# Patient Record
Sex: Female | Born: 1950
Health system: Southern US, Community
[De-identification: ages and names within clinical notes are randomized; demographics above are authoritative.]

## PROBLEM LIST (undated history)

## (undated) DIAGNOSIS — F419 Anxiety disorder, unspecified: Secondary | ICD-10-CM

## (undated) DIAGNOSIS — I509 Heart failure, unspecified: Secondary | ICD-10-CM

## (undated) DIAGNOSIS — R1013 Epigastric pain: Secondary | ICD-10-CM

## (undated) DIAGNOSIS — F32A Depression, unspecified: Secondary | ICD-10-CM

## (undated) DIAGNOSIS — M1990 Unspecified osteoarthritis, unspecified site: Secondary | ICD-10-CM

## (undated) DIAGNOSIS — E785 Hyperlipidemia, unspecified: Secondary | ICD-10-CM

## (undated) DIAGNOSIS — J449 Chronic obstructive pulmonary disease, unspecified: Secondary | ICD-10-CM

## (undated) DIAGNOSIS — E049 Nontoxic goiter, unspecified: Secondary | ICD-10-CM

## (undated) DIAGNOSIS — Z87891 Personal history of nicotine dependence: Secondary | ICD-10-CM

## (undated) DIAGNOSIS — F410 Panic disorder [episodic paroxysmal anxiety] without agoraphobia: Secondary | ICD-10-CM

## (undated) DIAGNOSIS — J439 Emphysema, unspecified: Secondary | ICD-10-CM

## (undated) DIAGNOSIS — R0989 Other specified symptoms and signs involving the circulatory and respiratory systems: Secondary | ICD-10-CM

## (undated) DIAGNOSIS — G4700 Insomnia, unspecified: Secondary | ICD-10-CM

## (undated) DIAGNOSIS — K219 Gastro-esophageal reflux disease without esophagitis: Secondary | ICD-10-CM

## (undated) DIAGNOSIS — M549 Dorsalgia, unspecified: Secondary | ICD-10-CM

## (undated) DIAGNOSIS — Z86010 Personal history of colonic polyps: Secondary | ICD-10-CM

## (undated) DIAGNOSIS — F329 Major depressive disorder, single episode, unspecified: Secondary | ICD-10-CM

## (undated) DIAGNOSIS — I499 Cardiac arrhythmia, unspecified: Secondary | ICD-10-CM

## (undated) DIAGNOSIS — T7840XA Allergy, unspecified, initial encounter: Secondary | ICD-10-CM

## (undated) DIAGNOSIS — I10 Essential (primary) hypertension: Secondary | ICD-10-CM

## (undated) DIAGNOSIS — R739 Hyperglycemia, unspecified: Secondary | ICD-10-CM

## (undated) HISTORY — DX: Personal history of nicotine dependence: Z87.891

## (undated) HISTORY — DX: Depression, unspecified: F32.A

## (undated) HISTORY — DX: Panic disorder (episodic paroxysmal anxiety): F41.0

## (undated) HISTORY — DX: Essential (primary) hypertension: I10

## (undated) HISTORY — DX: Chronic obstructive pulmonary disease, unspecified: J44.9

## (undated) HISTORY — DX: Epigastric pain: R10.13

## (undated) HISTORY — DX: Emphysema, unspecified: J43.9

## (undated) HISTORY — DX: Personal history of colonic polyps: Z86.010

## (undated) HISTORY — DX: Other specified symptoms and signs involving the circulatory and respiratory systems: R09.89

## (undated) HISTORY — DX: Cardiac arrhythmia, unspecified: I49.9

## (undated) HISTORY — DX: Hyperglycemia, unspecified: R73.9

## (undated) HISTORY — DX: Allergy, unspecified, initial encounter: T78.40XA

## (undated) HISTORY — PX: THYROID SURGERY: SHX805

## (undated) HISTORY — DX: Major depressive disorder, single episode, unspecified: F32.9

## (undated) HISTORY — DX: Heart failure, unspecified: I50.9

## (undated) HISTORY — DX: Dorsalgia, unspecified: M54.9

## (undated) HISTORY — DX: Insomnia, unspecified: G47.00

## (undated) HISTORY — DX: Gastro-esophageal reflux disease without esophagitis: K21.9

## (undated) HISTORY — DX: Unspecified osteoarthritis, unspecified site: M19.90

## (undated) HISTORY — PX: WISDOM TOOTH EXTRACTION: SHX21

## (undated) HISTORY — PX: COLONOSCOPY: SHX174

## (undated) HISTORY — DX: Hyperlipidemia, unspecified: E78.5

## (undated) HISTORY — DX: Nontoxic goiter, unspecified: E04.9

## (undated) HISTORY — PX: EYE SURGERY: SHX253

## (undated) HISTORY — DX: Anxiety disorder, unspecified: F41.9

---

## 2000-05-16 ENCOUNTER — Other Ambulatory Visit: Admission: RE | Admit: 2000-05-16 | Discharge: 2000-05-16 | Payer: Self-pay | Admitting: Obstetrics and Gynecology

## 2002-02-12 ENCOUNTER — Other Ambulatory Visit: Admission: RE | Admit: 2002-02-12 | Discharge: 2002-02-12 | Payer: Self-pay | Admitting: Obstetrics and Gynecology

## 2002-03-13 HISTORY — PX: ELBOW SURGERY: SHX618

## 2002-09-22 ENCOUNTER — Encounter: Payer: Self-pay | Admitting: Family Medicine

## 2002-09-22 ENCOUNTER — Ambulatory Visit (HOSPITAL_COMMUNITY): Admission: RE | Admit: 2002-09-22 | Discharge: 2002-09-22 | Payer: Self-pay | Admitting: Family Medicine

## 2003-02-16 ENCOUNTER — Ambulatory Visit (HOSPITAL_COMMUNITY): Admission: RE | Admit: 2003-02-16 | Discharge: 2003-02-16 | Payer: Self-pay | Admitting: *Deleted

## 2003-02-18 ENCOUNTER — Ambulatory Visit (HOSPITAL_BASED_OUTPATIENT_CLINIC_OR_DEPARTMENT_OTHER): Admission: RE | Admit: 2003-02-18 | Discharge: 2003-02-18 | Payer: Self-pay | Admitting: *Deleted

## 2003-11-24 ENCOUNTER — Other Ambulatory Visit: Admission: RE | Admit: 2003-11-24 | Discharge: 2003-11-24 | Payer: Self-pay | Admitting: Obstetrics and Gynecology

## 2003-12-12 ENCOUNTER — Encounter: Payer: Self-pay | Admitting: Family Medicine

## 2003-12-12 LAB — HM MAMMOGRAPHY

## 2004-05-17 ENCOUNTER — Ambulatory Visit: Payer: Self-pay | Admitting: Family Medicine

## 2004-05-25 ENCOUNTER — Ambulatory Visit: Payer: Self-pay | Admitting: Internal Medicine

## 2004-06-09 ENCOUNTER — Ambulatory Visit: Payer: Self-pay | Admitting: Internal Medicine

## 2004-06-09 DIAGNOSIS — Z860100 Personal history of colon polyps, unspecified: Secondary | ICD-10-CM

## 2004-06-09 HISTORY — DX: Personal history of colonic polyps: Z86.010

## 2004-06-09 HISTORY — DX: Personal history of colon polyps, unspecified: Z86.0100

## 2004-07-01 ENCOUNTER — Ambulatory Visit: Payer: Self-pay | Admitting: Family Medicine

## 2004-07-19 ENCOUNTER — Ambulatory Visit: Payer: Self-pay | Admitting: Family Medicine

## 2006-01-29 ENCOUNTER — Ambulatory Visit: Payer: Self-pay | Admitting: Family Medicine

## 2006-01-30 ENCOUNTER — Ambulatory Visit: Payer: Self-pay | Admitting: Family Medicine

## 2006-06-06 ENCOUNTER — Ambulatory Visit: Payer: Self-pay | Admitting: Family Medicine

## 2006-06-06 LAB — CONVERTED CEMR LAB
ALT: 23 units/L (ref 0–40)
AST: 24 units/L (ref 0–37)
VLDL: 24 mg/dL (ref 0–40)

## 2006-06-11 ENCOUNTER — Ambulatory Visit: Payer: Self-pay | Admitting: Family Medicine

## 2006-06-11 LAB — CONVERTED CEMR LAB
Free T4: 0.6 ng/dL (ref 0.6–1.6)
Glucose, Bld: 78 mg/dL (ref 70–99)
TSH: 1.02 microintl units/mL (ref 0.35–5.50)

## 2006-07-27 ENCOUNTER — Encounter: Payer: Self-pay | Admitting: Family Medicine

## 2006-07-27 DIAGNOSIS — F418 Other specified anxiety disorders: Secondary | ICD-10-CM | POA: Insufficient documentation

## 2006-07-27 DIAGNOSIS — E785 Hyperlipidemia, unspecified: Secondary | ICD-10-CM | POA: Insufficient documentation

## 2006-07-27 DIAGNOSIS — Z87891 Personal history of nicotine dependence: Secondary | ICD-10-CM

## 2006-07-27 DIAGNOSIS — Z8639 Personal history of other endocrine, nutritional and metabolic disease: Secondary | ICD-10-CM

## 2006-07-27 DIAGNOSIS — F410 Panic disorder [episodic paroxysmal anxiety] without agoraphobia: Secondary | ICD-10-CM

## 2006-07-27 DIAGNOSIS — Z9189 Other specified personal risk factors, not elsewhere classified: Secondary | ICD-10-CM

## 2006-07-27 DIAGNOSIS — F419 Anxiety disorder, unspecified: Secondary | ICD-10-CM

## 2006-12-13 ENCOUNTER — Encounter: Payer: Self-pay | Admitting: Family Medicine

## 2007-03-18 ENCOUNTER — Telehealth: Payer: Self-pay | Admitting: Family Medicine

## 2007-06-12 ENCOUNTER — Telehealth: Payer: Self-pay | Admitting: Family Medicine

## 2007-06-26 ENCOUNTER — Ambulatory Visit: Payer: Self-pay | Admitting: Family Medicine

## 2007-06-26 DIAGNOSIS — M545 Low back pain, unspecified: Secondary | ICD-10-CM | POA: Insufficient documentation

## 2007-06-26 DIAGNOSIS — M549 Dorsalgia, unspecified: Secondary | ICD-10-CM

## 2007-06-28 ENCOUNTER — Encounter: Payer: Self-pay | Admitting: Family Medicine

## 2007-07-15 ENCOUNTER — Telehealth: Payer: Self-pay | Admitting: Family Medicine

## 2007-07-16 ENCOUNTER — Ambulatory Visit: Payer: Self-pay | Admitting: Family Medicine

## 2007-07-17 ENCOUNTER — Telehealth (INDEPENDENT_AMBULATORY_CARE_PROVIDER_SITE_OTHER): Payer: Self-pay | Admitting: *Deleted

## 2007-07-19 LAB — CONVERTED CEMR LAB
ALT: 18 units/L (ref 0–35)
AST: 21 units/L (ref 0–37)
CO2: 29 meq/L (ref 19–32)
Chloride: 106 meq/L (ref 96–112)
Creatinine, Ser: 0.8 mg/dL (ref 0.4–1.2)
GFR calc Af Amer: 95 mL/min
GFR calc non Af Amer: 78 mL/min
LDL Cholesterol: 68 mg/dL (ref 0–99)
Phosphorus: 3.2 mg/dL (ref 2.3–4.6)
Potassium: 4 meq/L (ref 3.5–5.1)
Sodium: 141 meq/L (ref 135–145)
Total CHOL/HDL Ratio: 3.6

## 2007-07-25 ENCOUNTER — Ambulatory Visit: Payer: Self-pay | Admitting: Family Medicine

## 2007-07-25 DIAGNOSIS — J309 Allergic rhinitis, unspecified: Secondary | ICD-10-CM | POA: Insufficient documentation

## 2007-12-02 ENCOUNTER — Telehealth (INDEPENDENT_AMBULATORY_CARE_PROVIDER_SITE_OTHER): Payer: Self-pay | Admitting: *Deleted

## 2008-01-15 ENCOUNTER — Ambulatory Visit: Payer: Self-pay | Admitting: Family Medicine

## 2008-01-22 ENCOUNTER — Ambulatory Visit: Payer: Self-pay | Admitting: Family Medicine

## 2008-01-22 DIAGNOSIS — I10 Essential (primary) hypertension: Secondary | ICD-10-CM | POA: Insufficient documentation

## 2008-02-05 ENCOUNTER — Ambulatory Visit: Payer: Self-pay | Admitting: Family Medicine

## 2008-02-12 LAB — CONVERTED CEMR LAB
ALT: 18 units/L (ref 0–35)
AST: 20 units/L (ref 0–37)
BUN: 12 mg/dL (ref 6–23)
Bilirubin, Direct: 0.1 mg/dL (ref 0.0–0.3)
Calcium: 9.4 mg/dL (ref 8.4–10.5)
Eosinophils Relative: 1.5 % (ref 0.0–5.0)
Glucose, Bld: 95 mg/dL (ref 70–99)
Hemoglobin: 13.3 g/dL (ref 12.0–15.0)
LDL Cholesterol: 70 mg/dL (ref 0–99)
Lymphocytes Relative: 27.8 % (ref 12.0–46.0)
Monocytes Relative: 17.1 % — ABNORMAL HIGH (ref 3.0–12.0)
Neutro Abs: 5 10*3/uL (ref 1.4–7.7)
Phosphorus: 3.1 mg/dL (ref 2.3–4.6)
RDW: 11.8 % (ref 11.5–14.6)
Sodium: 143 meq/L (ref 135–145)
Total Bilirubin: 0.6 mg/dL (ref 0.3–1.2)
Total CHOL/HDL Ratio: 3
Triglycerides: 109 mg/dL (ref 0–149)
WBC: 9.3 10*3/uL (ref 4.5–10.5)

## 2008-02-21 ENCOUNTER — Ambulatory Visit: Payer: Self-pay | Admitting: Family Medicine

## 2008-02-21 DIAGNOSIS — R739 Hyperglycemia, unspecified: Secondary | ICD-10-CM

## 2008-02-25 ENCOUNTER — Ambulatory Visit: Payer: Self-pay | Admitting: Professional

## 2008-02-25 ENCOUNTER — Ambulatory Visit: Payer: Self-pay | Admitting: Family Medicine

## 2008-02-26 ENCOUNTER — Telehealth (INDEPENDENT_AMBULATORY_CARE_PROVIDER_SITE_OTHER): Payer: Self-pay | Admitting: *Deleted

## 2008-02-26 ENCOUNTER — Encounter: Payer: Self-pay | Admitting: Family Medicine

## 2008-03-31 ENCOUNTER — Ambulatory Visit: Payer: Self-pay | Admitting: Professional

## 2008-04-06 ENCOUNTER — Telehealth: Payer: Self-pay | Admitting: Family Medicine

## 2008-04-16 ENCOUNTER — Ambulatory Visit: Payer: Self-pay | Admitting: Family Medicine

## 2008-05-06 ENCOUNTER — Ambulatory Visit: Payer: Self-pay | Admitting: Family Medicine

## 2008-05-07 LAB — CONVERTED CEMR LAB
Albumin: 4.2 g/dL
BUN: 13 mg/dL
CO2: 27 meq/L
Calcium: 9.5 mg/dL
Chloride: 102 meq/L
Creatinine, Ser: 0.6 mg/dL
GFR calc Af Amer: 132 mL/min
GFR calc non Af Amer: 109 mL/min
Glucose, Bld: 104 mg/dL — ABNORMAL HIGH
Hgb A1c MFr Bld: 5.9 %
Phosphorus: 3 mg/dL
Potassium: 3.6 meq/L
Sodium: 139 meq/L

## 2008-05-19 ENCOUNTER — Ambulatory Visit: Payer: Self-pay | Admitting: Family Medicine

## 2008-10-12 ENCOUNTER — Telehealth: Payer: Self-pay | Admitting: Family Medicine

## 2008-10-28 ENCOUNTER — Ambulatory Visit: Payer: Self-pay | Admitting: Family Medicine

## 2008-11-18 ENCOUNTER — Encounter (INDEPENDENT_AMBULATORY_CARE_PROVIDER_SITE_OTHER): Payer: Self-pay | Admitting: *Deleted

## 2008-11-24 ENCOUNTER — Ambulatory Visit: Payer: Self-pay | Admitting: Family Medicine

## 2008-11-26 LAB — CONVERTED CEMR LAB
Albumin: 4.1 g/dL (ref 3.5–5.2)
BUN: 17 mg/dL (ref 6–23)
Chloride: 103 meq/L (ref 96–112)
Cholesterol: 144 mg/dL (ref 0–200)
Glucose, Bld: 88 mg/dL (ref 70–99)
HCT: 39.5 % (ref 36.0–46.0)
LDL Cholesterol: 64 mg/dL (ref 0–99)
MCV: 91.7 fL (ref 78.0–100.0)
Phosphorus: 3.5 mg/dL (ref 2.3–4.6)
Platelets: 251 10*3/uL (ref 150.0–400.0)
Triglycerides: 163 mg/dL — ABNORMAL HIGH (ref 0.0–149.0)
VLDL: 32.6 mg/dL (ref 0.0–40.0)
WBC: 11.5 10*3/uL — ABNORMAL HIGH (ref 4.5–10.5)

## 2008-12-01 ENCOUNTER — Encounter: Payer: Self-pay | Admitting: Family Medicine

## 2008-12-08 ENCOUNTER — Encounter: Admission: RE | Admit: 2008-12-08 | Discharge: 2008-12-08 | Payer: Self-pay | Admitting: Otolaryngology

## 2009-01-05 ENCOUNTER — Encounter: Admission: RE | Admit: 2009-01-05 | Discharge: 2009-01-05 | Payer: Self-pay | Admitting: Otolaryngology

## 2009-01-05 ENCOUNTER — Encounter: Payer: Self-pay | Admitting: Family Medicine

## 2009-01-05 ENCOUNTER — Encounter (INDEPENDENT_AMBULATORY_CARE_PROVIDER_SITE_OTHER): Payer: Self-pay | Admitting: Interventional Radiology

## 2009-01-05 ENCOUNTER — Other Ambulatory Visit: Admission: RE | Admit: 2009-01-05 | Discharge: 2009-01-05 | Payer: Self-pay | Admitting: Interventional Radiology

## 2009-01-19 ENCOUNTER — Encounter: Payer: Self-pay | Admitting: Family Medicine

## 2009-03-11 ENCOUNTER — Encounter (INDEPENDENT_AMBULATORY_CARE_PROVIDER_SITE_OTHER): Payer: Self-pay | Admitting: Otolaryngology

## 2009-03-11 ENCOUNTER — Encounter: Payer: Self-pay | Admitting: Family Medicine

## 2009-03-11 ENCOUNTER — Ambulatory Visit (HOSPITAL_COMMUNITY): Admission: RE | Admit: 2009-03-11 | Discharge: 2009-03-12 | Payer: Self-pay | Admitting: Otolaryngology

## 2009-04-09 ENCOUNTER — Telehealth: Payer: Self-pay | Admitting: Family Medicine

## 2009-05-07 ENCOUNTER — Encounter (INDEPENDENT_AMBULATORY_CARE_PROVIDER_SITE_OTHER): Payer: Self-pay | Admitting: *Deleted

## 2009-05-10 ENCOUNTER — Telehealth: Payer: Self-pay | Admitting: Family Medicine

## 2009-05-11 ENCOUNTER — Ambulatory Visit: Payer: Self-pay | Admitting: Family Medicine

## 2009-05-12 LAB — CONVERTED CEMR LAB
BUN: 8 mg/dL (ref 6–23)
Basophils Absolute: 0.1 10*3/uL (ref 0.0–0.1)
Bilirubin, Direct: 0 mg/dL (ref 0.0–0.3)
Chloride: 106 meq/L (ref 96–112)
Creatinine, Ser: 0.6 mg/dL (ref 0.4–1.2)
Eosinophils Absolute: 0.1 10*3/uL (ref 0.0–0.7)
Eosinophils Relative: 0.7 % (ref 0.0–5.0)
Glucose, Bld: 110 mg/dL — ABNORMAL HIGH (ref 70–99)
MCHC: 33.6 g/dL (ref 30.0–36.0)
MCV: 93.3 fL (ref 78.0–100.0)
Monocytes Absolute: 1.3 10*3/uL — ABNORMAL HIGH (ref 0.1–1.0)
Neutrophils Relative %: 57.7 % (ref 43.0–77.0)
Platelets: 238 10*3/uL (ref 150.0–400.0)
RDW: 11.8 % (ref 11.5–14.6)
Total Bilirubin: 0.5 mg/dL (ref 0.3–1.2)
WBC: 7.9 10*3/uL (ref 4.5–10.5)

## 2009-10-04 ENCOUNTER — Telehealth: Payer: Self-pay | Admitting: Family Medicine

## 2009-11-24 ENCOUNTER — Encounter: Payer: Self-pay | Admitting: Family Medicine

## 2009-11-25 ENCOUNTER — Ambulatory Visit: Payer: Self-pay | Admitting: Internal Medicine

## 2009-11-25 ENCOUNTER — Encounter: Payer: Self-pay | Admitting: Family Medicine

## 2009-11-25 LAB — CONVERTED CEMR LAB
Cholesterol, target level: 200 mg/dL
HDL goal, serum: 40 mg/dL
LDL Goal: 130 mg/dL

## 2009-11-26 ENCOUNTER — Ambulatory Visit: Payer: Self-pay | Admitting: Cardiology

## 2009-11-26 ENCOUNTER — Encounter: Payer: Self-pay | Admitting: Family Medicine

## 2009-11-29 ENCOUNTER — Encounter: Payer: Self-pay | Admitting: Family Medicine

## 2009-12-15 ENCOUNTER — Telehealth (INDEPENDENT_AMBULATORY_CARE_PROVIDER_SITE_OTHER): Payer: Self-pay

## 2009-12-16 ENCOUNTER — Ambulatory Visit: Payer: Self-pay | Admitting: Cardiology

## 2009-12-16 ENCOUNTER — Encounter: Payer: Self-pay | Admitting: Cardiology

## 2009-12-16 ENCOUNTER — Ambulatory Visit: Payer: Self-pay

## 2009-12-16 ENCOUNTER — Ambulatory Visit (HOSPITAL_COMMUNITY): Admission: RE | Admit: 2009-12-16 | Discharge: 2009-12-16 | Payer: Self-pay | Admitting: Cardiology

## 2009-12-17 ENCOUNTER — Telehealth: Payer: Self-pay | Admitting: Cardiology

## 2009-12-22 ENCOUNTER — Telehealth: Payer: Self-pay | Admitting: Cardiology

## 2009-12-30 ENCOUNTER — Telehealth: Payer: Self-pay | Admitting: Family Medicine

## 2010-01-01 ENCOUNTER — Encounter: Payer: Self-pay | Admitting: Cardiology

## 2010-01-06 ENCOUNTER — Ambulatory Visit: Payer: Self-pay | Admitting: Cardiology

## 2010-03-09 ENCOUNTER — Telehealth: Payer: Self-pay | Admitting: Internal Medicine

## 2010-03-21 ENCOUNTER — Telehealth (INDEPENDENT_AMBULATORY_CARE_PROVIDER_SITE_OTHER): Payer: Self-pay | Admitting: *Deleted

## 2010-03-23 ENCOUNTER — Ambulatory Visit: Admit: 2010-03-23 | Payer: Self-pay | Admitting: Family Medicine

## 2010-03-29 ENCOUNTER — Ambulatory Visit
Admission: RE | Admit: 2010-03-29 | Discharge: 2010-03-29 | Payer: Self-pay | Source: Home / Self Care | Attending: Family Medicine | Admitting: Family Medicine

## 2010-03-29 ENCOUNTER — Other Ambulatory Visit: Payer: Self-pay | Admitting: Family Medicine

## 2010-03-29 LAB — RENAL FUNCTION PANEL
Albumin: 4.4 g/dL (ref 3.5–5.2)
BUN: 11 mg/dL (ref 6–23)
CO2: 25 mEq/L (ref 19–32)
Calcium: 9.7 mg/dL (ref 8.4–10.5)
Chloride: 105 mEq/L (ref 96–112)
Creatinine, Ser: 0.8 mg/dL (ref 0.4–1.2)
GFR: 74.26 mL/min (ref >60.00)
Glucose, Bld: 127 mg/dL — ABNORMAL HIGH (ref 70–99)
Phosphorus: 2.8 mg/dL (ref 2.3–4.6)
Potassium: 4.1 mEq/L (ref 3.5–5.1)
Sodium: 141 mEq/L (ref 135–145)

## 2010-03-29 LAB — LIPID PANEL
Cholesterol: 147 mg/dL (ref 0–200)
HDL: 48.6 mg/dL (ref >39.00)
LDL Cholesterol: 65 mg/dL (ref 0–99)
Total CHOL/HDL Ratio: 3
Triglycerides: 168 mg/dL — ABNORMAL HIGH (ref 0.0–149.0)
VLDL: 33.6 mg/dL (ref 0.0–40.0)

## 2010-03-29 LAB — CBC WITH DIFFERENTIAL/PLATELET
Basophils Absolute: 0 10*3/uL (ref 0.0–0.1)
Basophils Relative: 0.3 % (ref 0.0–3.0)
Eosinophils Absolute: 0.1 10*3/uL (ref 0.0–0.7)
Eosinophils Relative: 0.5 % (ref 0.0–5.0)
HCT: 38.8 % (ref 36.0–46.0)
Hemoglobin: 13.3 g/dL (ref 12.0–15.0)
Lymphocytes Relative: 15.1 % (ref 12.0–46.0)
Lymphs Abs: 2 10*3/uL (ref 0.7–4.0)
MCHC: 34.3 g/dL (ref 30.0–36.0)
MCV: 92.8 fl (ref 78.0–100.0)
Monocytes Absolute: 2.4 10*3/uL — ABNORMAL HIGH (ref 0.1–1.0)
Monocytes Relative: 18 % — ABNORMAL HIGH (ref 3.0–12.0)
Neutro Abs: 8.8 10*3/uL — ABNORMAL HIGH (ref 1.4–7.7)
Neutrophils Relative %: 66.1 % (ref 43.0–77.0)
Platelets: 250 10*3/uL (ref 150.0–400.0)
RBC: 4.18 Mil/uL (ref 3.87–5.11)
RDW: 12.7 % (ref 11.5–14.6)
WBC: 13.4 10*3/uL — ABNORMAL HIGH (ref 4.5–10.5)

## 2010-03-29 LAB — HEPATIC FUNCTION PANEL
ALT: 20 U/L (ref 0–35)
AST: 25 U/L (ref 0–37)
Albumin: 4.4 g/dL (ref 3.5–5.2)
Alkaline Phosphatase: 61 U/L (ref 39–117)
Bilirubin, Direct: 0.1 mg/dL (ref 0.0–0.3)
Total Bilirubin: 0.4 mg/dL (ref 0.3–1.2)
Total Protein: 7.4 g/dL (ref 6.0–8.3)

## 2010-03-29 LAB — TSH: TSH: 1.62 u[IU]/mL (ref 0.35–5.50)

## 2010-03-29 LAB — HEMOGLOBIN A1C: Hgb A1c MFr Bld: 6.2 % (ref 4.6–6.5)

## 2010-03-30 DIAGNOSIS — D72829 Elevated white blood cell count, unspecified: Secondary | ICD-10-CM | POA: Insufficient documentation

## 2010-04-01 ENCOUNTER — Telehealth: Payer: Self-pay | Admitting: Family Medicine

## 2010-04-12 ENCOUNTER — Ambulatory Visit
Admission: RE | Admit: 2010-04-12 | Discharge: 2010-04-12 | Payer: Self-pay | Source: Home / Self Care | Attending: Family Medicine | Admitting: Family Medicine

## 2010-04-12 ENCOUNTER — Other Ambulatory Visit: Payer: Self-pay | Admitting: Family Medicine

## 2010-04-12 LAB — CBC WITH DIFFERENTIAL/PLATELET
Basophils Absolute: 0 10*3/uL (ref 0.0–0.1)
Basophils Relative: 0.4 % (ref 0.0–3.0)
Hemoglobin: 13.2 g/dL (ref 12.0–15.0)
Lymphocytes Relative: 26.3 % (ref 12.0–46.0)
Monocytes Relative: 17.7 % — ABNORMAL HIGH (ref 3.0–12.0)
Neutro Abs: 5.6 10*3/uL (ref 1.4–7.7)
Neutrophils Relative %: 55 % (ref 43.0–77.0)
RBC: 4.09 Mil/uL (ref 3.87–5.11)
WBC: 10.2 10*3/uL (ref 4.5–10.5)

## 2010-04-12 NOTE — Progress Notes (Signed)
Summary: Stress Echo Pre-Procedure  Phone Note Outgoing Call Call back at Home Phone [phone]   Call placed by: Irean Hong, RN,  December 15, 2009 2:50 PM Summary of Call: Patient notified of instructions for Stress Echo in am. Anita Harrell.

## 2010-04-12 NOTE — Progress Notes (Signed)
Summary: refill request for ambien  Phone Note Refill Request Message from:  Fax from Pharmacy  Refills Requested: Medication #1:  AMBIEN CR 12.5 MG TBCR take one by mouth at bedtime   Last Refilled: 09/03/2009 [facility] [address], [phone].  Initial call taken by: Lowella Petties CMA,  October 04, 2009 8:19 AM  Follow-up for Phone Call        px written on EMR for call in  Follow-up by: Judith Part MD,  October 04, 2009 8:34 AM  Additional Follow-up for Phone Call Additional follow up Details #1::        [facility]. Lewanda Rife LPN  October 04, 2009 9:11 AM     Prescriptions: AMBIEN CR 12.5 MG TBCR (ZOLPIDEM TARTRATE) take one by mouth at bedtime  #30 x 5   Entered and Authorized by:   Judith Part MD   Signed by:   Lewanda Rife LPN on 11/91/4782   Method used:   Telephoned to ...         RxID:   [id number]

## 2010-04-12 NOTE — Miscellaneous (Signed)
Summary: Appointment Canceled  Appointment status changed to canceled by [employer] on 11/26/2009 2:07 PM.  Cancellation Comments --------------------- stress echo with joyce/saf  Appointment Information ----------------------- Appt Type:  EMR NO DOCUMENT      Date:  Thursday, December 16, 2009      Time:  8:30 AM for 30 min   Urgency:  Routine   Made By:  Pearson Grippe  To Visit:  [id number]    Reason:  stress echo with joyce/saf  Appt Comments ------------- -- 11/26/09 14:07: (CEMR) CANCELED -- stress echo with joyce/saf -- 11/26/09 12:39: (CEMR) BOOKED -- Routine EMR NO DOCUMENT at 12/16/2009 8:30 AM for 30 min stress echo with joyce/saf

## 2010-04-12 NOTE — Assessment & Plan Note (Signed)
Summary: NP6/ ASAP / DX: CHESTPAIN/ PT HAS UHC/ GD    Visit Type:  Initial Consult Primary Averey Trompeter:  Colon Flattery Tower MD  CC:  chest pain.  History of Present Illness: The patient is here for evaluation of chest pain.  She had an episode several days ago severe pain.  EMS was called.  Her pain resolved an EKG showed no acute change.  She was then seen within our primary care office and referred to me for further evaluation.  The patient smoked for 30 years but she did stop in October, 2010.  There is no strong family history of coronary disease.  She does have mild hypertension. She is on Crestor for hyperlipidemia.  Her labs had shown good control of her LDL.  HDL is good.  Triglycerides are mildly elevated.  Patient has not had any recurrent chest pain since the episode several days ago.  Current Medications (verified): 1)  Paxil 40 Mg Tabs (Paroxetine Hcl) .Marland Kitchen.. 1 By Mouth Once Daily 2)  Ambien Cr 12.5 Mg Tbcr (Zolpidem Tartrate) .... Take One By Mouth At Bedtime 3)  Crestor 10 Mg Tabs (Rosuvastatin Calcium) .... Take One By Mouth Daily 4)  Nasonex  Susp (Mometasone Furoate Susp) .... 2 Sprays in Each Nostil Daily As Needed 5)  Multivitamins  Tabs (Multiple Vitamin) .... One By Mouth Daily 6)  Zestoretic 10-12.5 Mg Tabs (Lisinopril-Hydrochlorothiazide) .Marland Kitchen.. 1 By Mouth Each Am 7)  Tums 500 Mg Chew (Calcium Carbonate Antacid) .... Otc As Directed. 8)  Advil 200 Mg Tabs (Ibuprofen) .... Otc As Directed. 9)  Alprazolam 0.5 Mg Tabs (Alprazolam) .... 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 no alcohol at all   Past Medical History: CHEST PAIN  UNSPECIFIED  EPIGASTRIC PAIN OTHER SCREENING MAMMOGRAM  HYPERGLYCEMIA, MILD HYPERTENSION, BENIGN ESSENTIAL  ALLERGIC RHINITIS  BACK PAIN GOITER Hx of TOBACCO ABUSE Hx of PANIC DISORDER INSOMNIA, HX OF  HYPERLIPIDEMIA DEPRESSION  ANXIETY labile blood pressure  allergic rhinitis   Review of Systems       Patient denies fever, chills, headache, sweats, rash, change in vision, change in hearing, cough, nausea vomiting, urinary symptoms.  All other systems are reviewed and are negative.  Vital Signs:  Patient profile:   60 year old female Height:      65 inches Weight:      182 pounds BMI:     30.40 Pulse rate:   113 / minute Resp:     14 per minute BP sitting:   135 / 94  (left arm)  Vitals Entered By: Kem Parkinson (November 26, 2009 11:49 AM)  Physical Exam  General:  The patient is stable. Head:  head is atraumatic. Eyes:  no xanthelasma. Neck:  no jugular venous distention. Chest Wall:  no chest wall tenderness. Breasts:  breasts are large. Lungs:  lungs are clear.  Respiratory effort is nonlabored. Heart:  cardiac exam reveals S1 and S2.  No clicks or significant murmurs. Abdomen:  abdomen is soft. Msk:  no musculoskeletal deformities. Extremities:  no peripheral edema. Skin:  no skin rashes. Psych:  patient is oriented to person time and place.  Affect is  normal.  The patient is here with her husband today.   Impression & Recommendations:  Problem # 1:  HYPERTENSION, BENIGN ESSENTIAL (ICD-401.1)  The following medications were removed from the medication list:    Aspirin 81 Mg Tbec (Aspirin) ..... One daily Her updated medication list for this problem includes:    Zestoretic 10-12.5 Mg Tabs (Lisinopril-hydrochlorothiazide) .Marland Kitchen... 1 by mouth each am Blood pressure slightly elevated today.  I will not be changing her medications.  Problem # 2:  Hx of TOBACCO ABUSE (ICD-305.1) Fortunately the patient stopped smoking in October, 2010.  Problem # 3:   HYPERLIPIDEMIA (ICD-272.4)  Her updated medication list for this problem includes:    Crestor 10 Mg Tabs (Rosuvastatin calcium) .Marland Kitchen... Take one by mouth daily The patient's lipids are being treated very nicely.  No change in therapy.  Problem # 4:  CHEST PAIN UNSPECIFIED (ICD-786.50)  The following medications were removed from the medication list:    Aspirin 81 Mg Tbec (Aspirin) ..... One daily Her updated medication list for this problem includes:    Zestoretic 10-12.5 Mg Tabs (Lisinopril-hydrochlorothiazide) .Marland Kitchen... 1 by mouth each am The patient's chest pain the other day is concerning.  However there is no evidence of an acute MI.  EKG is done today and reviewed by me.  There is mild sinus tachycardia.  We will proceed with a stress echo to assess her status further.  I will then see her back for followup.  I have not changed any of her medications.  Orders: EKG w/ Interpretation (93000) Stress Echo (Stress Echo)  Patient Instructions: 1)  Your physician has requested that you have a stress echocardiogram. For further information please visit [email or web address]  Please follow instruction sheet as given. 2)  Follow up in 1 month with Dr Myrtis Ser

## 2010-04-12 NOTE — Miscellaneous (Signed)
Summary: Appointment Canceled  Appointment status changed to canceled by [employer] on 11/29/2009 2:36 PM.  Cancellation Comments --------------------- stress echo with joyce/saf  Appointment Information ----------------------- Appt Type:  EMR NO DOCUMENT      Date:  Thursday, December 16, 2009      Time:  3:00 PM for 30 min   Urgency:  Routine   Made By:  Pearson Grippe  To Visit:  [id number]    Reason:  stress echo with joyce/saf  Appt Comments ------------- -- 11/29/09 14:36: (CEMR) CANCELED -- stress echo with joyce/saf -- 11/26/09 14:07: (CEMR) BOOKED -- Routine EMR NO DOCUMENT at 12/16/2009 3:00 PM for 30 min stress echo with joyce/saf

## 2010-04-12 NOTE — Progress Notes (Signed)
Summary: TEST RESULTS   Phone Note Call from Patient Call back at Home Phone [phone]   Caller: Patient Reason for Call: Talk to Nurse, Lab or Test Results Summary of Call: PT WANT TO KNOW HER ECHO STRESS RESULTS. PT WANTS TO SPEAK WITH HER NURSE. PT STATES SHE HAS CALLED BEFORE BUT HAS NOT RECEIVE A CALL. Initial call taken by: Roe Coombs,  December 22, 2009 1:26 PM  Follow-up for Phone Call        I recieved mess today, prev. mess was not sent to RN, called pt and apoligized, gave her test results Meredith Staggers, RN  December 22, 2009 6:05 PM

## 2010-04-12 NOTE — Progress Notes (Signed)
Summary: ECHO RESULTS   Phone Note Call from Patient Call back at [facility] Phone [phone]   Caller: Patient Summary of Call: ECHO REULTS Initial call taken by: Judie Grieve,  December 17, 2009 3:34 PM  Follow-up for Phone Call        mess was never sent to RN, when pt called back on 10/12 this mess was discovered, see that phone mess Meredith Staggers, RN  December 22, 2009 6:07 PM

## 2010-04-12 NOTE — Progress Notes (Signed)
Summary: Crestor 10mg   Phone Note Refill Request Call back at [phone] [facility] on December 30, 2009 4:43 PM  Refills Requested: Medication #1:  CRESTOR 10 MG TABS Take one by mouth daily [facility] electronically request refill on Crestor 10mg  no refill date sent. Pt has been seeing Cardiologist. Please advise about refilling Crestor. thank you.   Method Requested: Telephone to Pharmacy Initial call taken by: Lewanda Rife LPN,  December 30, 2009 4:44 PM  Follow-up for Phone Call        please remind her that I think she is due for lipid labs - here or with cardiol (she sees Dr Myrtis Ser later this month) thanks px written on EMR for call in  Follow-up by: Judith Part MD,  December 30, 2009 4:51 PM  Additional Follow-up for Phone Call Additional follow up Details #1::        [facility] as instructed. Added note for pt to call our office. Left message for patient to call back on pt's cell phone. Lewanda Rife LPN  December 30, 2009 4:59 PM   Patient notified as instructed by telephone. Pt said she would call back and schedule fasting lab test.[facility] LPN  December 31, 2009 12:19 PM     New/Updated Medications: CRESTOR 10 MG TABS (ROSUVASTATIN CALCIUM) Take one by mouth daily 0.Prescriptions: CRESTOR 10 MG TABS (ROSUVASTATIN CALCIUM) Take one by mouth daily  #30 x 11   Entered and Authorized by:   Judith Part MD   Signed by:   Lewanda Rife LPN on 45/40/9811   Method used:   Telephoned to ...       [facility] [facility]ail)       [address]       [place], [place]  [address]       Ph: [phone] or [phone]       Fax: [phone]   RxID:   [id number]

## 2010-04-12 NOTE — Miscellaneous (Signed)
Summary: Appointment Canceled  Appointment status changed to canceled by [employer] on 11/29/2009 2:33 PM.  Cancellation Comments --------------------- stress echo/786.50/uch/no pre. req/saf  Appointment Information ----------------------- Appt Type:  CARDIOLOGY ANCILLARY VISIT      Date:  Thursday, December 16, 2009      Time:  3:00 PM for 60 min   Urgency:  Routine   Made By:  Pearson Grippe  To Visit:  [id number]    Reason:  stress echo/786.50/uch/no pre. req/saf  Appt Comments ------------- -- 11/29/09 14:33: (CEMR) CANCELED -- stress echo/786.50/uch/no pre. req/saf -- 11/26/09 12:38: (CEMR) BOOKED -- Routine CARDIOLOGY ANCILLARY VISIT at 12/16/2009 3:00 PM for 60 min stress echo/786.50/uch/no pre. req/saf

## 2010-04-12 NOTE — Miscellaneous (Signed)
  Clinical Lists Changes  Observations: Added new observation of PAST MED HX: CHEST PAIn    stress echo...12/16/2009...normal...EF  60% EF  60%...echo...12/2009 EPIGASTRIC PAIN OTHER SCREENING MAMMOGRAM  HYPERGLYCEMIA, MILD HYPERTENSION, BENIGN ESSENTIAL  ALLERGIC RHINITIS  BACK PAIN GOITER Hx of TOBACCO ABUSE Hx of PANIC DISORDER INSOMNIA, HX OF  HYPERLIPIDEMIA DEPRESSION  ANXIETY labile blood pressure  allergic rhinitis    cardiol-- Dr Myrtis Ser (01/01/2010 20:45) Added new observation of PRIMARY MD: Judith Part MD (01/01/2010 20:45)       Past History:  Past Medical History: CHEST PAIn    stress echo...12/16/2009...normal...EF  60% EF  60%...echo...12/2009 EPIGASTRIC PAIN OTHER SCREENING MAMMOGRAM  HYPERGLYCEMIA, MILD HYPERTENSION, BENIGN ESSENTIAL  ALLERGIC RHINITIS  BACK PAIN GOITER Hx of TOBACCO ABUSE Hx of PANIC DISORDER INSOMNIA, HX OF  HYPERLIPIDEMIA DEPRESSION  ANXIETY labile blood pressure  allergic rhinitis    cardiol-- Dr Myrtis Ser

## 2010-04-12 NOTE — Progress Notes (Signed)
Summary: Rx Paxil  Phone Note Refill Request Call back at [phone] [facility] on May 10, 2009 9:30 AM  Refills Requested: Medication #1:  PAXIL 40 MG TABS 1 by mouth once daily   Last Refilled: 04/08/2009 Received faxed refill request, please advise   Method Requested: Telephone to Pharmacy Initial call taken by: Linde Gillis CMA Duncan Dull),  May 10, 2009 9:30 AM  Follow-up for Phone Call        px written on EMR for call in  Follow-up by: Judith Part MD,  May 10, 2009 1:08 PM    Prescriptions: PAXIL 40 MG TABS (PAROXETINE HCL) 1 by mouth once daily  #30 x 11   Entered by:   Delilah Shan CMA (AAMA)   Authorized by:   Judith Part MD   Signed by:   Delilah Shan CMA (AAMA) on 05/10/2009   Method used:   Electronically to        [facility] (retail)       [address]       [place], [place]  [address]       Ph: [phone] or [phone]       Fax: [phone]   RxID:   [id number]

## 2010-04-12 NOTE — Progress Notes (Signed)
Summary: ? Stomach virus  Phone Note Call from Patient   Caller: Patient Call For: Judith Part MD Summary of Call: Patient has been vomiting since Friday, mostly phlem, she hasn't eating anything except chicken noodle soup, having back pain and upper stomach pain.  Low grade fever, 99.2, some nausea, some diarrhea, drinking ginger ale.  Advised patient to try Pedialyte, small sipps to keep herself hydrated, no fried/greasy foods, no soda.  [facility]  Please advise. Initial call taken by: Linde Gillis CMA Duncan Dull),  May 10, 2009 10:06 AM  Follow-up for Phone Call        unfortunately she needs to be seen- but not able to today  I want her to go to [facility] for eval please (or closer UC if that is not convenient for her)  Follow-up by: Judith Part MD,  May 10, 2009 10:52 AM  Additional Follow-up for Phone Call Additional follow up Details #1::        Patient Advised.   Patient asked if she could get an appt. here tomorrow?  Call sent to [facility] for scheduling. Additional Follow-up by: Delilah Shan CMA (AAMA),  May 10, 2009 11:42 AM

## 2010-04-12 NOTE — Assessment & Plan Note (Signed)
Summary: [facility]  Medications Added MULTIVITAMINS  TABS (MULTIPLE VITAMIN) every other day ASPIRIN 81 MG TBEC (ASPIRIN) Take one tablet by mouth daily      Allergies Added:   Visit Type:  Follow-up Primary Provider:  Judith Part MD  CC:  chest pain.  History of Present Illness: The patient is seen for follow up of the evaluation of chest pain.  I saw her on November 26, 2009.  We decided at that time to proceed with a stress echo.  This was done December 16, 2009.  She had normal exercise with no EKG change.  Resting echo images were normal and there was no ischemia with stress.  This was a negative study.  Since the time I saw her she has been dealing with her home stresses better.  She feels better in general.  Current Medications (verified): 1)  Paxil 40 Mg Tabs (Paroxetine Hcl) .Marland Kitchen.. 1 By Mouth Once Daily 2)  Ambien Cr 12.5 Mg Tbcr (Zolpidem Tartrate) .... Take One By Mouth At Bedtime 3)  Crestor 10 Mg Tabs (Rosuvastatin Calcium) .... Take One By Mouth Daily 4)  Multivitamins  Tabs (Multiple Vitamin) .... Every Other Day 5)  Zestoretic 10-12.5 Mg Tabs (Lisinopril-Hydrochlorothiazide) .Marland Kitchen.. 1 By Mouth Each Am 6)  Tums 500 Mg Chew (Calcium Carbonate Antacid) .... Otc As Directed. 7)  Advil 200 Mg Tabs (Ibuprofen) .... Otc As Directed. 8)  Alprazolam 0.5 Mg Tabs (Alprazolam) .... One Pill Daily As Needed For Panic Attack 9)  Aspirin 81 Mg Tbec (Aspirin) .... Take One Tablet By Mouth Daily  Allergies (verified): 1)  Codeine  Past History:  Past Medical History: CHEST PAIn    stress echo...12/16/2009...normal...EF  60% EF  60%...echo...12/2009.Marland Kitchen EPIGASTRIC PAIN OTHER SCREENING MAMMOGRAM  HYPERGLYCEMIA, MILD HYPERTENSION, BENIGN ESSENTIAL  ALLERGIC RHINITIS  BACK PAIN GOITER Hx of TOBACCO ABUSE Hx of PANIC DISORDER INSOMNIA, HX OF  HYPERLIPIDEMIA DEPRESSION  ANXIETY labile blood pressure  allergic rhinitis    cardiol-- Dr Myrtis Ser  Review of Systems    Patient denies fever, chills, headache, sweats, rash, change in vision, change in hearing, chest pain, cough, nausea vomiting, urinary symptoms.  All other systems are reviewed and are negative.  Vital Signs:  Patient profile:   60 year old female Weight:      187 pounds Pulse rate:   98 / minute BP sitting:   114 / 78  (left arm)  Vitals Entered By: Hardin Negus, RMA (January 06, 2010 8:29 AM)  Physical Exam  General:  patient is stable. Eyes:  no xanthelasma. Neck:  no jugular venous attention. Lungs:  lungs are clear.  Respiratory effort is nonlabored. Heart:  cardiac exam reveals an S1-S2.  No clicks or significant murmurs. Abdomen:  abdomen is soft. Extremities:  no peripheral edema. Psych:  patient is oriented to person time and place.  Affect is normal.  She is dealing with illnesses in her family better.   Impression & Recommendations:  Problem # 1:  CHEST PAIN UNSPECIFIED (ICD-786.50)  Her updated medication list for this problem includes:    Zestoretic 10-12.5 Mg Tabs (Lisinopril-hydrochlorothiazide) .Marland Kitchen... 1 by mouth each am    Aspirin 81 Mg Tbec (Aspirin) .Marland Kitchen... Take one tablet by mouth daily The patient's stress echo is normal.  She has normal LV function and no sign of ischemia.  No further workup needed. I had a careful discussion with the patient about all aspects of her exercise test.  She is reassured.  Problem # 2:  HYPERTENSION, BENIGN ESSENTIAL (ICD-401.1)  Her updated medication list for this problem includes:    Zestoretic 10-12.5 Mg Tabs (Lisinopril-hydrochlorothiazide) .Marland Kitchen... 1 by mouth each am    Aspirin 81 Mg Tbec (Aspirin) .Marland Kitchen... Take one tablet by mouth daily Blood pressure is controlled today.  No change in therapy.  Patient Instructions: 1)  Your physician recommends that you schedule a follow-up appointment in: As needed 2)  Your physician recommends that you continue on your current medications as directed. Please refer to the Current  Medication list given to you today.

## 2010-04-12 NOTE — Progress Notes (Signed)
Summary: Rx Zolpidem  Phone Note Refill Request Call back at [phone] [facility] on April 09, 2009 8:22 AM  Refills Requested: Medication #1:  AMBIEN CR 12.5 MG TBCR take one by mouth at bedtime   Last Refilled: 03/10/2009 Received faxed refill request, please advise   Method Requested: Electronic Initial call taken by: Linde Gillis CMA Duncan Dull),  April 09, 2009 8:23 AM  Follow-up for Phone Call        px written on EMR for call in  Follow-up by: Judith Part MD,  April 09, 2009 8:40 AM  Additional Follow-up for Phone Call Additional follow up Details #1::        Rx called to pharmacy Additional Follow-up by: Linde Gillis CMA Duncan Dull),  April 09, 2009 9:45 AM    New/Updated Medications: AMBIEN CR 12.5 MG TBCR (ZOLPIDEM TARTRATE) take one by mouth at bedtime Prescriptions: AMBIEN CR 12.5 MG TBCR (ZOLPIDEM TARTRATE) take one by mouth at bedtime  #30 x 5   Entered and Authorized by:   Judith Part MD   Signed by:   Judith Part MD on 04/09/2009   Method used:   Telephoned to ...       [facility] (retail)       [address]       [place], [place]  [address]       Ph: [phone] or [phone]       Fax: [phone]   RxID:   [id number]

## 2010-04-12 NOTE — Letter (Signed)
Summary: Colonoscopy Letter  [facility]  [address] [place], [place] [address]   Phone: [phone]  Fax: [phone]      May 07, 2009 MRN: [id number]   [facility] [address] [place], [place]  [address]   Dear Ms. Benassi,   According to your medical record, it is time for you to schedule a Colonoscopy. The American Cancer Society recommends this procedure as a method to detect early colon cancer. Patients with a family history of colon cancer, or a personal history of colon polyps or inflammatory bowel disease are at increased risk.  This letter has beeen generated based on the recommendations made at the time of your procedure. If you feel that in your particular situation this may no longer apply, please contact our office.  Please call our office at [phone] to schedule this appointment or to update your records at your earliest convenience.  Thank you for cooperating with [place] to provide you with the very best care possible.   Sincerely,  Iva Boop, M.D.  [facility] [phone]
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
## 2010-04-12 NOTE — Assessment & Plan Note (Signed)
Summary: THROWING UP,FATIGUE/CLE   Vital Signs:  Patient profile:   60 year old female Height:      65 inches Weight:      176.75 pounds BMI:     29.52 Temp:     97.9 degrees F oral Pulse rate:   88 / minute Pulse rhythm:   regular BP sitting:   128 / 82  (left arm) Cuff size:   regular  Vitals Entered By: Lewanda Rife LPN (May 12, 863 11:28 AM)  History of Present Illness: started sat am - got up and vomited - liquid and phlegm   having abd cramps just above belly button and vomiting and in bed all day  sunday - vomited in am and off and on through the day  yesterday worse cramps than she was having - and they rad to back /also heartburn and indigestion   when she vomits is mostly fluid  is able to keep down clear fluids  few bites of sandwich last nt -kept down until this am  some headache now   had low grade fever no blood in stool or vomitus stool is loose but not severely so   last vomit this am-- is a little better now   no sick contacts  no new foods     did retire - very happy about that  did quit smoking had thyroid surgery for b9 tumors   walks 2 mi per day and abd exercises    Allergies: 1)  Codeine  Past History:  Past Medical History: Last updated: 07/25/2007 Anxiety Depression Hyperlipidemia labile blood pressure  allergic rhinitis  goiter tab abuse  Family History: Last updated: 05/19/2008 Father: ETOH, CHF, lung ca Mother: HTN Sister- breast ca , CHF from chemotx , has defib   Social History: Last updated: 11/24/2008 Marital Status: Married Children: 2  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 Eyes:  Denies blurring and eye irritation. ENT:  Denies sore throat. CV:  Denies chest pain or discomfort, lightheadness, and palpitations. Resp:  Denies cough, shortness of breath, and wheezing. GI:  Complains of abdominal pain, diarrhea, indigestion, nausea, and vomiting; denies bloody stools and dark tarry stools. GU:  Denies discharge and dysuria. MS:  Denies muscle aches. Derm:  Denies poor wound healing and rash. Heme:  Denies abnormal bruising and bleeding.  Physical Exam  General:  mildly fatigued appearing  Head:  normocephalic, atraumatic, and no abnormalities observed.   Eyes:  vision grossly intact, pupils equal, pupils round, and pupils reactive to light.  no conjunctival pallor, injection or icterus  Mouth:  MMM, throat clear  Neck:  No deformities, masses, or tenderness noted. Lungs:  diffusely distant bs without wheeze or rales  Heart:  Normal rate and regular rhythm. S1 and S2 normal without gallop, murmur, click, rub or other extra sounds. Abdomen:  mild epigastric tenderness mildly inc bs 4 Q- not high pitched or tinkling soft, no distention, no masses, no hepatomegaly, and no splenomegaly.   Msk:  no CVA tenderness  Pulses:  R and L carotid,radial,femoral,dorsalis pedis and posterior tibial pulses are full and equal bilaterally Skin:  Intact without suspicious lesions or rashes nl color and turgor  no pallor or jaundice quick cap refil time Cervical Nodes:  No lymphadenopathy noted Inguinal Nodes:  No significant adenopathy Psych:  normal affect, talkative and pleasant    Impression & Recommendations:  Problem # 1:  EPIGASTRIC PAIN (ICD-789.06) Assessment New suspect from gastroenteritis viral (with vomiting and loose stools that are slowing down) disc gradual re-hydration with clear fluids and then slowly adv brat diet  lab today  update if worse pain or fever or if s/s dehydration- which we reviewed  Orders: Venipuncture (13086) TLB-BMP  (Basic Metabolic Panel-BMET) (80048-METABOL) TLB-CBC Platelet - w/Differential (85025-CBCD) TLB-Hepatic/Liver Function Pnl (80076-HEPATIC)  Complete Medication List: 1)  Paxil 40 Mg Tabs (Paroxetine hcl) .Marland Kitchen.. 1 by mouth once daily 2)  Ambien Cr 12.5 Mg Tbcr (Zolpidem tartrate) .... Take one by mouth at bedtime 3)  Crestor 10 Mg Tabs (Rosuvastatin calcium) .... Take one by mouth daily 4)  Nasonex Susp (Mometasone furoate susp) .... 2 sprays in each nostil daily as needed 5)  Multivitamins Tabs (Multiple vitamin) .... One by mouth daily 6)  Aspirin Adult Low Strength 81 Mg Tbec (Aspirin) .... One by mouth daily 7)  Zestoretic 10-12.5 Mg Tabs (Lisinopril-hydrochlorothiazide) .Marland Kitchen.. 1 by mouth each am 8)  Alprazolam 0.5 Mg Tabs (Alprazolam) .Marland Kitchen.. 1 p once daily as needed severe anxiety 9)  Fish Oil 1000 Mg Caps (Omega-3 fatty acids) .... Take 1 by mouth once daily 10)  Tums 500 Mg Chew (Calcium carbonate antacid) .... Otc as directed. 11)  Advil 200 Mg Tabs (Ibuprofen) .... Otc as directed.  Patient Instructions: 1)  stick with sips of clear fluids and stir bubbles out of carbonated beverages  2)  slowly introduce food - BRAT (bananas/ rice /applesauce and toast )  3)  lab today 4)  get some prilosec otc to settle stomach -- 20 mg one pill daily for 7-10 days -- start today  5)  update me if worse abd pain or no further improvement   Current Allergies (reviewed today): CODEINE

## 2010-04-14 NOTE — Progress Notes (Signed)
----   Converted from flag ---- ---- 03/19/2010 5:08 PM, Judith Part MD wrote: please check lipid/wellness for v70.0 and 272 thanks   ---- 03/17/2010 1:15 PM, Liane Comber CMA (AAMA) wrote: Lab orders please! Good Morning! This pt is scheduled for cpx labs Wed, which labs to draw and dx codes to use? Thanks Tasha ------------------------------

## 2010-04-14 NOTE — Progress Notes (Signed)
Summary: Schedule Colonoscopy.   Phone Note Outgoing Call Call back at [facility] Phone [phone]   Call placed by: Harlow Mares CMA Duncan Dull),  March 09, 2010 3:33 PM Call placed to: Patient Summary of Call: Left a message on patients machine to call back. patient needs colonoscopy due to adenomatous polyps.  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  Follow-up by: Harlow Mares CMA (AAMA),  March 18, 2010 10:51 AM

## 2010-04-14 NOTE — Progress Notes (Signed)
Summary: refill request for Anita Harrell, nasonex  Phone Note Refill Request Message from:  Fax from Pharmacy  Refills Requested: Medication #1:  AMBIEN CR 12.5 MG TBCR take one by mouth at bedtime   Last Refilled: 02/28/2010  Medication #2:  nasonex [facility]  [address], [phone].  Also,  pt requests a refill on nasonex, this is no longer on med list.  Initial call taken by: Lowella Petties CMA, AAMA,  April 01, 2010 2:47 PM  Follow-up for Phone Call        the med list won't let me in -- ? says someone else is in her chart please call in 1 mo of each- thanks   Follow-up by: Judith Part MD,  April 01, 2010 5:06 PM  Additional Follow-up for Phone Call Additional follow up Details #1::        Rx called in as directed and noted in chart.  Additional Follow-up by: Janee Morn CMA [facility] Dull),  April 01, 2010 5:24 PM

## 2010-04-14 NOTE — Assessment & Plan Note (Signed)
Summary: F/U LABWORK/CLE   Vital Signs:  Patient profile:   60 year old female Weight:      187.75 pounds BMI:     31.36 Temp:     98.6 degrees F oral Pulse rate:   110 / minute Pulse rhythm:   regular BP sitting:   130 / 84  (left arm) Cuff size:   large  Vitals Entered By: Selena Batten Dance CMA [facility] Dull) (March 29, 2010 9:29 AM) CC: Follow up   History of Present Illness: here for f/u of hyperglycemia and HTN and lipids and mood disorder   she has had a rough year overall  had chest pain and went to cardiology  was told poss panic attack  grief over lost of BIL and MIL within 2 weeks of each other  also a lot of caregiving   is still a non smoker- is very proud of that    wt is stable with bmi of 31  hyperglycemia - due for check diet   lipids due for check on crestor and diet  mood -- panic attacks are slowly improving  is worse in the ams -- then better as day gets started can start some exercise - likes to walk  does not want counseling  on paxil and alprazolam as needed panic attacks   hx of goiter - no changes   Allergies: 1)  Codeine  Past History:  Past Medical History: Last updated: 01/06/2010 CHEST PAIn    stress echo...12/16/2009...normal...EF  60% EF  60%...echo...12/2009.Marland Kitchen EPIGASTRIC PAIN OTHER SCREENING MAMMOGRAM  HYPERGLYCEMIA, MILD HYPERTENSION, BENIGN ESSENTIAL  ALLERGIC RHINITIS  BACK PAIN GOITER Hx of TOBACCO ABUSE Hx of PANIC DISORDER INSOMNIA, HX OF  HYPERLIPIDEMIA DEPRESSION  ANXIETY labile blood pressure  allergic rhinitis    cardiol-- Dr Myrtis Ser  Past Surgical History: Last updated: 12/20/2009 Elbow surgery (2004) Colonoscopy- rectal polyps, diverticulosis (05/2004) thyroid surgery- B9 masses  10/11 stress echo normal  Family History: Last updated: 05/19/2008 Father: ETOH, CHF, lung ca Mother: HTN Sister- breast ca , CHF from chemotx , has defib   Social History: Last updated: 11/25/2009 Marital Status:  Married Children: 2  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 no alcohol at all   Risk Factors: Smoking Status: quit (11/25/2009)  Review of Systems General:  Denies fatigue, malaise, and sweats. Eyes:  Denies blurring and eye irritation. CV:  Denies chest pain or discomfort, lightheadness, and palpitations. Resp:  Denies cough, shortness of breath, and wheezing. GI:  Denies abdominal pain, change in bowel habits, indigestion, and nausea. MS:  Denies muscle aches and cramps. Derm:  Denies itching, lesion(s), poor wound healing, and rash. Neuro:  Denies numbness and tingling. Psych:  Complains of anxiety and panic attacks; denies suicidal thoughts/plans. Endo:  Denies cold intolerance, excessive thirst, excessive urination, and heat intolerance. Heme:  Denies abnormal bruising and bleeding.  Physical Exam  General:  overweight but generally well appearing  Head:  normocephalic, atraumatic, and no abnormalities observed.   Eyes:  vision grossly intact, pupils equal, pupils round, and pupils reactive to light.  no conjunctival pallor, injection or icterus  Mouth:  pharynx pink and moist.   Neck:  stable goiter no jvd/ ln or carotid bruits  Chest Wall:  No deformities, masses, or tenderness noted. Lungs:  diffusely distant bs good air exch CTA Heart:  Normal rate and regular rhythm. S1 and S2 normal without gallop, murmur, click, rub or other extra sounds. Abdomen:  no renal bruits  Msk:  no CVA tenderness  Pulses:  2+ rad pulses Extremities:  No clubbing, cyanosis, edema, or deformity noted with normal full range of motion of all joints.   Neurologic:  sensation intact to light touch, gait normal, and DTRs symmetrical and normal.   Skin:  Intact without suspicious lesions or rashes Cervical Nodes:  No lymphadenopathy noted Inguinal Nodes:  No significant adenopathy Psych:  tearful at times - but overall talkative and good  attitude not seemingly anx  good eye contact and comm skills    Impression & Recommendations:  Problem # 1:  HYPERGLYCEMIA, MILD (ICD-790.29) Assessment Unchanged pt has stuck to low sugar diet  disc this in detail  AIc today f/u 6 mo adv to start back with exercise Orders: Venipuncture (16109) TLB-Lipid Panel (80061-LIPID) TLB-CBC Platelet - w/Differential (85025-CBCD) TLB-Renal Function Panel (80069-RENAL) TLB-Hepatic/Liver Function Pnl (80076-HEPATIC) TLB-TSH (Thyroid Stimulating Hormone) (84443-TSH) TLB-A1C / Hgb A1C (Glycohemoglobin) (83036-A1C) Prescription Created Electronically [phone])  Problem # 2:  HYPERTENSION, BENIGN ESSENTIAL (ICD-401.1) Assessment: Unchanged  this is fairly controlled - even with anx  no change in med lab today f/u 6 mo  Her updated medication list for this problem includes:    Zestoretic 10-12.5 Mg Tabs (Lisinopril-hydrochlorothiazide) .Marland Kitchen... 1 by mouth each am  Orders: Venipuncture (09811) TLB-Lipid Panel (80061-LIPID) TLB-CBC Platelet - w/Differential (85025-CBCD) TLB-Renal Function Panel (80069-RENAL) TLB-Hepatic/Liver Function Pnl (80076-HEPATIC) TLB-TSH (Thyroid Stimulating Hormone) (84443-TSH) TLB-A1C / Hgb A1C (Glycohemoglobin) (83036-A1C) Prescription Created Electronically [phone])  BP today: 130/84 Prior BP: 114/78 (01/06/2010)  Prior 10 Yr Risk Heart Disease: 8 % (11/25/2009)  Labs Reviewed: K+: 3.6 (05/11/2009) Creat: : 0.6 (05/11/2009)   Chol: 144 (11/24/2008)   HDL: 47.20 (11/24/2008)   LDL: 64 (11/24/2008)   TG: 163.0 (11/24/2008)  Problem # 3:  Hx of PANIC DISORDER (ICD-300.01) Assessment: Improved  with generalized anx and grief declines counseling  slowly improved  disc sit stress/ coping mech/ and tx opt in detail  refil meds  Her updated medication list for this problem includes:    Paxil 40 Mg Tabs (Paroxetine hcl) .Marland Kitchen... 1 by mouth once daily    Alprazolam 0.5 Mg Tabs (Alprazolam) ..... One pill daily as  needed for panic attack  Orders: Prescription Created Electronically [phone])  Problem # 4:  GOITER (ICD-240.9) Assessment: Unchanged no changes tsh today Orders: Venipuncture (13086) TLB-Lipid Panel (80061-LIPID) TLB-CBC Platelet - w/Differential (85025-CBCD) TLB-Renal Function Panel (80069-RENAL) TLB-Hepatic/Liver Function Pnl (80076-HEPATIC) TLB-TSH (Thyroid Stimulating Hormone) (84443-TSH) TLB-A1C / Hgb A1C (Glycohemoglobin) (83036-A1C)  Complete Medication List: 1)  Paxil 40 Mg Tabs (Paroxetine hcl) .Marland Kitchen.. 1 by mouth once daily 2)  Ambien Cr 12.5 Mg Tbcr (Zolpidem tartrate) .... Take one by mouth at bedtime 3)  Crestor 10 Mg Tabs (Rosuvastatin calcium) .... Take one by mouth daily 4)  Multivitamins Tabs (Multiple vitamin) .... Every other day 5)  Zestoretic 10-12.5 Mg Tabs (Lisinopril-hydrochlorothiazide) .Marland Kitchen.. 1 by mouth each am 6)  Tums 500 Mg Chew (Calcium carbonate antacid) .... Otc as directed. 7)  Advil 200 Mg Tabs (Ibuprofen) .... Otc as directed. 8)  Alprazolam 0.5 Mg Tabs (Alprazolam) .... One pill daily as needed for panic attack 9)  Aspirin 81 Mg Tbec (Aspirin) .... Take one tablet by mouth daily  Patient Instructions: 1)  start walking at least 5 days per week  2)  labs today  3)  if you are interested in counseling for grief and anxiety let me know  4)  follow up in about 6 months  Prescriptions: ZESTORETIC 10-12.5 MG TABS (LISINOPRIL-HYDROCHLOROTHIAZIDE) 1 by mouth each am  #30 x 11   Entered and Authorized by:   Judith Part MD   Signed by:   Judith Part MD on 03/29/2010   Method used:   [facility]        [facility] (retail)       [address]       [place], [place]  [address]       Ph: [phone] or [phone]       Fax: [phone]   RxID:   [id number] PAXIL 40 MG TABS (PAROXETINE HCL) 1 by mouth once daily  #30 x 11   Entered and Authorized by:   Judith Part MD   Signed by:   Judith Part MD on  03/29/2010   Method used:   [facility]        [facility] (retail)       [address]       [place], [place]  [address]       Ph: [phone] or [phone]       Fax: [phone]   RxID:   [id number] ALPRAZOLAM 0.5 MG TABS (ALPRAZOLAM) one pill daily as needed for panic attack  #15 x 0   Entered and Authorized by:   Judith Part MD   Signed by:   Judith Part MD on 03/29/2010   Method used:   Print then Give to Patient   RxID:   [id number]    Orders Added: 1)  Venipuncture [60737] 2)  TLB-Lipid Panel [80061-LIPID] 3)  TLB-CBC Platelet - w/Differential [85025-CBCD] 4)  TLB-Renal Function Panel [80069-RENAL] 5)  TLB-Hepatic/Liver Function Pnl [80076-HEPATIC] 6)  TLB-TSH (Thyroid Stimulating Hormone) [84443-TSH] 7)  TLB-A1C / Hgb A1C (Glycohemoglobin) [83036-A1C] 8)  Prescription Created Electronically [G8553] 9)  Est. Patient Level IV [10626]    Current Allergies (reviewed today): CODEINE

## 2010-05-03 ENCOUNTER — Telehealth: Payer: Self-pay | Admitting: Family Medicine

## 2010-05-10 NOTE — Progress Notes (Signed)
Summary: alprazolam / ambien  Phone Note Refill Request Message from:  Fax from Pharmacy on May 03, 2010 8:50 AM  Refills Requested: Medication #1:  ALPRAZOLAM 0.5 MG TABS one pill daily as needed for panic attack   Last Refilled: 04/01/2010  Medication #2:  AMBIEN CR 12.5 MG TBCR take one by mouth at bedtime   Last Refilled: 04/01/2010 [facility]. [phone].   Initial call taken by: Melody Comas,  May 03, 2010 8:50 AM  Follow-up for Phone Call        warn her do not mix these meds please- thanks  Follow-up by: Judith Part MD,  May 03, 2010 10:49 AM  Additional Follow-up for Phone Call Additional follow up Details #1::        Left message on voicemail to call back. Sydell Axon LPN  May 03, 2010 1:16 PM  Patient notified as instructed by telephone. Rx's called to pharmacy. Additional Follow-up by: Sydell Axon LPN,  May 03, 2010 2:14 PM    New/Updated Medications: ALPRAZOLAM 0.5 MG TABS (ALPRAZOLAM) one pill daily as needed for panic attack Prescriptions: AMBIEN CR 12.5 MG TBCR (ZOLPIDEM TARTRATE) take one by mouth at bedtime  #30 x 0   Entered and Authorized by:   Judith Part MD   Signed by:   Judith Part MD on 05/03/2010   Method used:   Telephoned to ...       [facility] [facility]retail)       [address]       [place], [place]  [address]       Ph: [phone] or [phone]       Fax: [phone]   RxID:   [id number] ALPRAZOLAM 0.5 MG TABS (ALPRAZOLAM) one pill daily as needed for panic attack  #15 x 0   Entered and Authorized by:   Judith Part MD   Signed by:   Judith Part MD on 05/03/2010   Method used:   Telephoned to ...       [facility] (retail)       [address]       [place], [place]  [address]       Ph: [phone] or [phone]       Fax: [phone]   RxID:   [id number]

## 2010-05-31 ENCOUNTER — Other Ambulatory Visit: Payer: Self-pay | Admitting: *Deleted

## 2010-05-31 MED ORDER — ZOLPIDEM TARTRATE ER 12.5 MG PO TBCR: 12.5000 mg | EXTENDED_RELEASE_TABLET | Freq: Every evening | ORAL | Status: DC | PRN

## 2010-05-31 NOTE — Telephone Encounter (Signed)
Refill request from [facility]. [phone]. 

## 2010-05-31 NOTE — Telephone Encounter (Signed)
Med called in to [facility] as instructed.

## 2010-06-13 LAB — BASIC METABOLIC PANEL
BUN: 10 mg/dL (ref 6–23)
CO2: 25 mEq/L (ref 19–32)
Chloride: 99 mEq/L (ref 96–112)
Creatinine, Ser: 0.57 mg/dL (ref 0.4–1.2)
Potassium: 3.9 mEq/L (ref 3.5–5.1)

## 2010-06-13 LAB — CBC
HCT: 39.5 % (ref 36.0–46.0)
Hemoglobin: 13.8 g/dL (ref 12.0–15.0)
MCV: 91.1 fL (ref 78.0–100.0)
Platelets: 260 10*3/uL (ref 150–400)
WBC: 11.5 10*3/uL — ABNORMAL HIGH (ref 4.0–10.5)

## 2010-06-30 ENCOUNTER — Other Ambulatory Visit: Payer: Self-pay | Admitting: *Deleted

## 2010-06-30 MED ORDER — ZOLPIDEM TARTRATE ER 12.5 MG PO TBCR: 12.5000 mg | EXTENDED_RELEASE_TABLET | Freq: Every evening | ORAL | Status: DC | PRN

## 2010-06-30 NOTE — Telephone Encounter (Signed)
Medication phoned to [facility] pharmacy as instructed.  

## 2010-06-30 NOTE — Telephone Encounter (Signed)
Px written for call in   

## 2010-07-29 NOTE — Op Note (Signed)
NAMEKYM, Anita Harrell                        ACCOUNT NO.:  [id number]   MEDICAL RECORD NO.:  [id number]                   PATIENT TYPE:  AMB   LOCATION:  [facility]                                  FACILITY:  [facility]   PHYSICIAN:  Lowell Bouton, M.D.      DATE OF BIRTH:  11-Jan-1951   DATE OF PROCEDURE:  DATE OF DISCHARGE:                                 OPERATIVE REPORT   PREOPERATIVE DIAGNOSIS:  Ulnar nerve compression left elbow.   POSTOPERATIVE DIAGNOSIS:  Ulnar nerve compression left elbow.   PROCEDURE:  Anterior transposition of ulnar nerve left elbow.   SURGEON:  Lowell Bouton, M.D.   ANESTHESIA:  General.   OPERATIVE FINDINGS:  The patient had obvious compression of the nerve just  distal to the medial epicondyle. Procedure under general anesthesia with a  tourniquet on the left arm. The left arm was prepped and draped in the usual  fashion after exsanguinating the limb. The tourniquet was inflated to 250  mmHg. A curved incision was made medially over the ulnar nerve. Sharp  dissection was carried through the subcutaneous tissue. The nerve was  identified posterior to the medial epicondyle and was traced from the medial  epicondyle about 8 cm proximally. It was released proximally and then the  nerve was traced distally and fascial bands were released just beyond the  medial epicondyle that appeared to be compressing the nerve. Care was taken  to protect the branch to the flexor carpal naris. After performing a  neurolysis the nerve was freed circumferentially and a bed was developed  anterior to the flexor muscle mass off of the medial epicondyle. The  subcutaneous tissue was dissected up and the nerve was transposed anterior  to the medial epicondyle. It was released proximally and distally far enough  so that there was no tension on the nerve with flexion extension. The branch  to the epithelia was preserved. The nerve was transposed anteriorly and was  secured with a subcutaneous flap of fat and fascia using a 4-0 Vicryl  suture. Three 4-0 Vicryl sutures were inserted to keep the nerve anterior to  the epicondyle. The wound was then irrigated copiously with saline. A vessel  loop drain was left in for drainage. The subcutaneous tissue was closed with  4-0 Vicryl and the skin with a 3-0 subcuticular Prolene. Steri-Strips were  applied. Then 0.5% Marcaine was inserted for pain control. The patient had  dressings applied and a long arm splint. The tourniquet was released with  good circulation to the hand. She went to the recovery room awake, stable,  and in good condition.                                               Lowell Bouton, M.D.    EMM/MEDQ  D:  02/18/2003  T:  02/19/2003  Job:  [id number]   cc:   Neta Mends. Fabian Sharp, M.D. [facility]

## 2010-09-26 ENCOUNTER — Other Ambulatory Visit: Payer: Self-pay | Admitting: *Deleted

## 2010-09-26 MED ORDER — ZOLPIDEM TARTRATE ER 12.5 MG PO TBCR: 12.5000 mg | EXTENDED_RELEASE_TABLET | Freq: Every evening | ORAL | Status: DC | PRN

## 2010-09-26 NOTE — Telephone Encounter (Signed)
Medication phoned to [facility] as instructed.  

## 2010-09-26 NOTE — Telephone Encounter (Signed)
Px written for call in   

## 2010-11-10 ENCOUNTER — Other Ambulatory Visit: Payer: Self-pay | Admitting: Obstetrics and Gynecology

## 2010-12-23 ENCOUNTER — Other Ambulatory Visit: Payer: Self-pay | Admitting: *Deleted

## 2010-12-23 MED ORDER — ROSUVASTATIN CALCIUM 10 MG PO TABS: 10.0000 mg | ORAL_TABLET | Freq: Every day | ORAL | Status: DC

## 2011-01-20 ENCOUNTER — Other Ambulatory Visit: Payer: Self-pay

## 2011-01-20 MED ORDER — ZOLPIDEM TARTRATE ER 12.5 MG PO TBCR: 12.5000 mg | EXTENDED_RELEASE_TABLET | Freq: Every evening | ORAL | Status: DC | PRN

## 2011-01-20 NOTE — Telephone Encounter (Signed)
Px written for call in   

## 2011-01-20 NOTE — Telephone Encounter (Signed)
Medication phoned to [facility] pharmacy as instructed.  

## 2011-01-20 NOTE — Telephone Encounter (Signed)
[facility] faxed refill request Zolpidem tart ER 12.5 mg. Last filled 12/23/10. Pt last seen 03/29/10. Please advise.

## 2011-03-20 ENCOUNTER — Other Ambulatory Visit: Payer: Self-pay | Admitting: Family Medicine

## 2011-03-21 NOTE — Telephone Encounter (Signed)
[facility] request refill on Lisinopril HCTZ 10-12.5 mg and Paxil 40 mg. Pt last seen 03/29/2010. Added to [employer] from [employer]. Please advise.

## 2011-03-21 NOTE — Telephone Encounter (Signed)
Spoke with pt and scheduled a f/u appointment 03/27/11 at 2 pm.

## 2011-03-21 NOTE — Telephone Encounter (Signed)
Please schedule follow up - it has been over a year Will refill electronically

## 2011-03-27 ENCOUNTER — Encounter: Payer: Self-pay | Admitting: Family Medicine

## 2011-03-27 ENCOUNTER — Ambulatory Visit (INDEPENDENT_AMBULATORY_CARE_PROVIDER_SITE_OTHER): Payer: 59 | Admitting: Family Medicine

## 2011-03-27 VITALS — BP 136/74 | HR 104 | Temp 97.9°F | Ht 65.0 in | Wt 193.2 lb

## 2011-03-27 DIAGNOSIS — R7309 Other abnormal glucose: Secondary | ICD-10-CM

## 2011-03-27 DIAGNOSIS — I10 Essential (primary) hypertension: Secondary | ICD-10-CM

## 2011-03-27 DIAGNOSIS — J069 Acute upper respiratory infection, unspecified: Secondary | ICD-10-CM | POA: Insufficient documentation

## 2011-03-27 DIAGNOSIS — Z23 Encounter for immunization: Secondary | ICD-10-CM

## 2011-03-27 DIAGNOSIS — E785 Hyperlipidemia, unspecified: Secondary | ICD-10-CM

## 2011-03-27 LAB — COMPREHENSIVE METABOLIC PANEL
ALT: 27 U/L (ref 0–35)
AST: 30 U/L (ref 0–37)
Creatinine, Ser: 0.7 mg/dL (ref 0.4–1.2)
GFR: 94.77 mL/min (ref >60.00)
Total Bilirubin: 0.8 mg/dL (ref 0.3–1.2)

## 2011-03-27 LAB — LIPID PANEL
Cholesterol: 131 mg/dL (ref 0–200)
LDL Cholesterol: 49 mg/dL (ref 0–99)
Triglycerides: 185 mg/dL — ABNORMAL HIGH (ref 0.0–149.0)
VLDL: 37 mg/dL (ref 0.0–40.0)

## 2011-03-27 LAB — HEMOGLOBIN A1C: Hgb A1c MFr Bld: 6.2 % (ref 4.6–6.5)

## 2011-03-27 MED ORDER — PAROXETINE HCL 40 MG PO TABS: 40.0000 mg | ORAL_TABLET | Freq: Every day | ORAL | Status: DC

## 2011-03-27 MED ORDER — MOMETASONE FUROATE 50 MCG/ACT NA SUSP: 2.0000 | Freq: Every day | NASAL | Status: DC

## 2011-03-27 MED ORDER — ROSUVASTATIN CALCIUM 10 MG PO TABS: 10.0000 mg | ORAL_TABLET | Freq: Every day | ORAL | Status: DC

## 2011-03-27 MED ORDER — LISINOPRIL-HYDROCHLOROTHIAZIDE 10-12.5 MG PO TABS: 1.0000 | ORAL_TABLET | Freq: Every day | ORAL | Status: DC

## 2011-03-27 NOTE — Progress Notes (Signed)
Subjective:    Patient ID: Anita Harrell, female    DOB: Aug 24, 1950, 61 y.o.   MRN: [id number]  HPI Here for f/u of chronic med problems and refil Was sick - and continues to cough  No fever -does not think she had the flu   Needs labs-will get today    bp is 136/74    Today No cp or palpitations or headaches or edema  No side effects to medicines   Wt is up 6 lb with bmi of 32-- lost her mother and her sister this summer -- tough time Has not done anything active since July  Grief is starting to get better  Is now starting to exercise  (4 deaths in a year)   Sister had cancer all over -- ? Perhaps from the lung/ ? Breast -- will try to find out  Mother passed at 89 with cva , died of bladder infection  Lipids due for check  On crestor and diet which has kept her at goal Diet - is back eating better this month -- was eating poorly for a while   Exercise- starting to walk again   Tab status -- has not started smoking !- is very proud   Hyperglycemia- due to check sugar  No excess thirst or urination Some sweets over xmas  Diet- worse   Patient Active Problem List  Diagnoses  . GOITER  . HYPERLIPIDEMIA  . ANXIETY  . PANIC DISORDER  . Former smoker  . DEPRESSION  . HYPERTENSION, BENIGN ESSENTIAL  . ALLERGIC RHINITIS  . BACK PAIN  . HYPERGLYCEMIA, MILD  . INSOMNIA, HX OF  . LEUKOCYTOSIS  . URI (upper respiratory infection)   Past Medical History  Diagnosis Date  . Chest pain   . Epigastric pain   . Hypertension   . Hyperglycemia     mild  . Allergy     allergic rhinitis  . Back pain   . Goiter   . History of tobacco abuse   . Panic disorder     History of  . Insomnia     Hx of  . Hyperlipidemia   . Depression   . Anxiety   . Labile blood pressure    Past Surgical History  Procedure Date  . Elbow surgery 2004  . Thyroid surgery     B9 massess   History  Substance Use Topics  . Smoking status: Former Smoker    Quit date: 12/11/2008  .  Smokeless tobacco: Not on file  . Alcohol Use:    Family History  Problem Relation Age of Onset  . Hypertension Mother   . Stroke Mother   . Alcohol abuse Father   . Cancer Father     lung CA  . Heart disease Father     CHF  . Cancer Sister     breast CA  . Heart disease Sister     CHF from chemotx also has defib   Allergies  Allergen Reactions  . Codeine     REACTION: Nausea and vomiting   Current Outpatient Prescriptions on File Prior to Visit  Medication Sig Dispense Refill  . zolpidem (AMBIEN CR) 12.5 MG CR tablet Take 1 tablet (12.5 mg total) by mouth at bedtime as needed for sleep.  30 tablet  3        Review of Systems Review of Systems  Constitutional: Negative for fever, appetite change, fatigue and unexpected weight change.  Eyes: Negative for pain and visual  disturbance.  ENT neg for ear pain or st Respiratory: Negative for sob or wheeze Cardiovascular: Negative for cp or palpitations    Gastrointestinal: Negative for nausea, diarrhea and constipation.  Genitourinary: Negative for urgency and frequency.  Skin: Negative for pallor or rash   Neurological: Negative for weakness, light-headedness, numbness and headaches.  Hematological: Negative for adenopathy. Does not bruise/bleed easily.  Psychiatric/Behavioral: Negative for dysphoric mood. The patient is not nervous/anxious.          Objective:   Physical Exam  Constitutional: She appears well-developed and well-nourished. No distress.  HENT:  Head: Normocephalic and atraumatic.  Right Ear: External ear normal.  Left Ear: External ear normal.  Mouth/Throat: Oropharynx is clear and moist.       Nares are boggy  Eyes: Conjunctivae and EOM are normal. Pupils are equal, round, and reactive to light. No scleral icterus.  Neck: Normal range of motion. Neck supple. No JVD present. Carotid bruit is not present. No thyromegaly present.  Cardiovascular: Normal rate, regular rhythm, normal heart sounds and  intact distal pulses.   Pulmonary/Chest: Effort normal and breath sounds normal. No respiratory distress. She has no wheezes. She has no rales. She exhibits no tenderness.  Abdominal: Soft. She exhibits no distension, no abdominal bruit and no mass. There is no tenderness.  Musculoskeletal: Normal range of motion. She exhibits no edema and no tenderness.  Lymphadenopathy:    She has no cervical adenopathy.  Neurological: She is alert. She has normal reflexes. No cranial nerve deficit or sensory deficit. She exhibits normal muscle tone. Coordination normal.  Skin: Skin is warm and dry. No rash noted. No erythema. No pallor.  Psychiatric: She has a normal mood and affect.          Assessment & Plan:

## 2011-03-27 NOTE — Assessment & Plan Note (Signed)
Gradually improved Reassuring exam guifenesin otc Update if not starting to improve in a week or if worsening

## 2011-03-27 NOTE — Assessment & Plan Note (Signed)
Lipids today On crestor  Diet improving  Has been well controlled Disc goals for chol

## 2011-03-27 NOTE — Patient Instructions (Signed)
Keep using your exercise bike Lab today Flu shot today  Get back to a healthy diet  Follow up in about 6 months  If you feel like you need a counselor for grief- let me know  If cough gets much worse or you run fever- let me know

## 2011-03-27 NOTE — Assessment & Plan Note (Signed)
bp in fair control at this time  No changes needed  Disc lifstyle change with low sodium diet and exercise   

## 2011-03-27 NOTE — Assessment & Plan Note (Signed)
Labs today Bad diet for a while but really getting back on track with that and exercise F/u 6 mo

## 2011-03-28 LAB — CBC WITH DIFFERENTIAL/PLATELET
HCT: 38 % (ref 36.0–46.0)
MCV: 90.8 fl (ref 78.0–100.0)
Platelets: 251 10*3/uL (ref 150.0–400.0)
RDW: 13.6 % (ref 11.5–14.6)

## 2011-05-15 ENCOUNTER — Other Ambulatory Visit: Payer: Self-pay | Admitting: *Deleted

## 2011-05-15 ENCOUNTER — Other Ambulatory Visit: Payer: Self-pay | Admitting: Family Medicine

## 2011-05-15 MED ORDER — ZOLPIDEM TARTRATE ER 12.5 MG PO TBCR: 12.5000 mg | EXTENDED_RELEASE_TABLET | Freq: Every evening | ORAL | Status: DC | PRN

## 2011-05-15 NOTE — Telephone Encounter (Signed)
Px written for call in  Please

## 2011-05-16 NOTE — Telephone Encounter (Signed)
[facility]

## 2011-07-17 ENCOUNTER — Other Ambulatory Visit: Payer: Self-pay | Admitting: Family Medicine

## 2011-07-17 NOTE — Telephone Encounter (Signed)
Will refill electronically  

## 2011-09-18 ENCOUNTER — Other Ambulatory Visit: Payer: Self-pay

## 2011-09-18 MED ORDER — ZOLPIDEM TARTRATE ER 12.5 MG PO TBCR: 12.5000 mg | EXTENDED_RELEASE_TABLET | Freq: Every evening | ORAL | Status: DC | PRN

## 2011-09-18 NOTE — Telephone Encounter (Signed)
Ok to refill? Last OV was 03/27/11

## 2011-09-18 NOTE — Telephone Encounter (Signed)
Px written for call in   

## 2011-09-19 NOTE — Telephone Encounter (Signed)
Called in Rx as directed.  

## 2012-01-03 ENCOUNTER — Other Ambulatory Visit: Payer: Self-pay | Admitting: Family Medicine

## 2012-01-04 NOTE — Telephone Encounter (Signed)
Called in Rx as prescribed 

## 2012-01-04 NOTE — Telephone Encounter (Signed)
Px written for call in   

## 2012-01-04 NOTE — Telephone Encounter (Signed)
Ok to refill 

## 2012-01-23 ENCOUNTER — Encounter: Payer: Self-pay | Admitting: Family Medicine

## 2012-01-23 ENCOUNTER — Ambulatory Visit (INDEPENDENT_AMBULATORY_CARE_PROVIDER_SITE_OTHER): Payer: 59 | Admitting: Family Medicine

## 2012-01-23 VITALS — BP 112/82 | HR 92 | Temp 98.3°F | Ht 65.0 in | Wt 191.0 lb

## 2012-01-23 DIAGNOSIS — E785 Hyperlipidemia, unspecified: Secondary | ICD-10-CM

## 2012-01-23 DIAGNOSIS — E049 Nontoxic goiter, unspecified: Secondary | ICD-10-CM

## 2012-01-23 DIAGNOSIS — R7309 Other abnormal glucose: Secondary | ICD-10-CM

## 2012-01-23 DIAGNOSIS — I10 Essential (primary) hypertension: Secondary | ICD-10-CM

## 2012-01-23 DIAGNOSIS — Z23 Encounter for immunization: Secondary | ICD-10-CM

## 2012-01-23 LAB — COMPREHENSIVE METABOLIC PANEL
ALT: 18 U/L (ref 0–35)
AST: 23 U/L (ref 0–37)
Chloride: 101 mEq/L (ref 96–112)
Creatinine, Ser: 0.6 mg/dL (ref 0.4–1.2)
Sodium: 137 mEq/L (ref 135–145)
Total Bilirubin: 0.4 mg/dL (ref 0.3–1.2)

## 2012-01-23 LAB — LIPID PANEL
HDL: 45.2 mg/dL (ref >39.00)
LDL Cholesterol: 55 mg/dL (ref 0–99)
Total CHOL/HDL Ratio: 3
Triglycerides: 166 mg/dL — ABNORMAL HIGH (ref 0.0–149.0)

## 2012-01-23 LAB — CBC WITH DIFFERENTIAL/PLATELET
Basophils Absolute: 0 10*3/uL (ref 0.0–0.1)
Eosinophils Absolute: 0 10*3/uL (ref 0.0–0.7)
Hemoglobin: 12.8 g/dL (ref 12.0–15.0)
Lymphocytes Relative: 27.5 % (ref 12.0–46.0)
Lymphs Abs: 2.1 10*3/uL (ref 0.7–4.0)
MCHC: 33.5 g/dL (ref 30.0–36.0)
Neutro Abs: 4.1 10*3/uL (ref 1.4–7.7)
Platelets: 223 10*3/uL (ref 150.0–400.0)
RDW: 12.8 % (ref 11.5–14.6)

## 2012-01-23 NOTE — Progress Notes (Signed)
Subjective:    Patient ID: Anita Harrell, female    DOB: 07/16/1950, 61 y.o.   MRN: [id number]  HPI Here for f/u of chronic health conditions  Is doing better overall  Went through her grief for 11/2 years- gradually getting better  Does not cry as much and feels healthy    bp is stable today  No cp or palpitations or headaches or edema  No side effects to medicines  BP Readings from Last 3 Encounters:  01/23/12 112/82  03/27/11 136/74  03/29/10 130/84      Hyperglycemia Eating really healthy , no more junk food in house No bread  Is walking fast 11/2 miles per day - is frustrated at lack of wt loss She bought some hand weights - feels better overall - starting to use those  Has lost some inches  Lab Results  Component Value Date   HGBA1C 6.2 03/27/2011     Wt is stable with bmi over 30  Exercise - doing much better   Hyperlipidemia  crestor and diet No problems with med Lab Results  Component Value Date   CHOL 131 03/27/2011   HDL 45.40 03/27/2011   LDLCALC 49 03/27/2011   TRIG 185.0* 03/27/2011   CHOLHDL 3 03/27/2011    Lab Results  Component Value Date   TSH 2.16 03/27/2011    Flu vaccine- got it today   Tdap- need that today   Due for labs today She sees her gyn soon for other health mt    Patient Active Problem List  Diagnosis  . GOITER  . HYPERLIPIDEMIA  . ANXIETY  . PANIC DISORDER  . Former smoker  . DEPRESSION  . HYPERTENSION, BENIGN ESSENTIAL  . ALLERGIC RHINITIS  . BACK PAIN  . HYPERGLYCEMIA, MILD  . INSOMNIA, HX OF  . LEUKOCYTOSIS  . URI (upper respiratory infection)   Past Medical History  Diagnosis Date  . Chest pain   . Epigastric pain   . Hypertension   . Hyperglycemia     mild  . Allergy     allergic rhinitis  . Back pain   . Goiter   . History of tobacco abuse   . Panic disorder     History of  . Insomnia     Hx of  . Hyperlipidemia   . Depression   . Anxiety   . Labile blood pressure    Past Surgical  History  Procedure Date  . Elbow surgery 2004  . Thyroid surgery     B9 massess   History  Substance Use Topics  . Smoking status: Former Smoker    Quit date: 12/11/2008  . Smokeless tobacco: Not on file  . Alcohol Use: No   Family History  Problem Relation Age of Onset  . Hypertension Mother   . Stroke Mother   . Alcohol abuse Father   . Cancer Father     lung CA  . Heart disease Father     CHF  . Cancer Sister     breast CA  . Heart disease Sister     CHF from chemotx also has defib   Allergies  Allergen Reactions  . Codeine     REACTION: Nausea and vomiting   Current Outpatient Prescriptions on File Prior to Visit  Medication Sig Dispense Refill  . aspirin 81 MG tablet Take 81 mg by mouth daily.      . CRESTOR 10 MG tablet TAKE 1 TABLET BY MOUTH AT BEDTIME  30 tablet  11  . lisinopril-hydrochlorothiazide (PRINZIDE,ZESTORETIC) 10-12.5 MG per tablet Take 1 tablet by mouth daily.  30 tablet  11  . mometasone (NASONEX) 50 MCG/ACT nasal spray Place 2 sprays into the nose daily. 2 sprays in each nostril once daily as needed.  17 g  11  . Multiple Vitamin (MULTIVITAMIN) tablet Take 1 tablet by mouth every other day.      . Omega-3 Fatty Acids (FISH OIL PO) Take 1 capsule by mouth daily.      Marland Kitchen PARoxetine (PAXIL) 40 MG tablet Take 1 tablet (40 mg total) by mouth daily.  30 tablet  11  . zolpidem (AMBIEN CR) 12.5 MG CR tablet TAKE 1 TABLET BY MOUTH AT BEDTIME AS NEEDED  30 tablet  3  . [DISCONTINUED] lisinopril-hydrochlorothiazide (PRINZIDE,ZESTORETIC) 10-12.5 MG per tablet TAKE 1 TABLET BY MOUTH EVERY MORNING  30 tablet  11  . [DISCONTINUED] PARoxetine (PAXIL) 40 MG tablet TAKE 1 TABLET BY MOUTH EVERY DAY  30 tablet  11  . [DISCONTINUED] rosuvastatin (CRESTOR) 10 MG tablet Take 1 tablet (10 mg total) by mouth at bedtime.  30 tablet  11    Review of Systems Review of Systems  Constitutional: Negative for fever, appetite change, fatigue and unexpected weight change.  Eyes:  Negative for pain and visual disturbance.  Respiratory: Negative for cough and shortness of breath.   Cardiovascular: Negative for cp or palpitations    Gastrointestinal: Negative for nausea, diarrhea and constipation.  Genitourinary: Negative for urgency and frequency.  Skin: Negative for pallor or rash   Neurological: Negative for weakness, light-headedness, numbness and headaches.  Hematological: Negative for adenopathy. Does not bruise/bleed easily.  Psychiatric/Behavioral: Negative for dysphoric mood. The patient is not nervous/anxious.  mood is overall much better        Objective:   Physical Exam  Constitutional: She appears well-developed and well-nourished. No distress.       overwt and well appearing   HENT:  Head: Normocephalic and atraumatic.  Mouth/Throat: Oropharynx is clear and moist.  Eyes: Conjunctivae normal and EOM are normal. Pupils are equal, round, and reactive to light. Right eye exhibits no discharge. Left eye exhibits no discharge. No scleral icterus.  Neck: Normal range of motion. Neck supple. No JVD present. Carotid bruit is not present. No thyromegaly present.       Scar noted from prev thyroid removal  Cardiovascular: Normal rate, regular rhythm, normal heart sounds and intact distal pulses.  Exam reveals no gallop.   Pulmonary/Chest: Effort normal and breath sounds normal. No respiratory distress. She has no wheezes.       bs are distant but clear   Abdominal: Soft. Bowel sounds are normal. She exhibits no distension, no abdominal bruit and no mass. There is no tenderness.  Musculoskeletal: She exhibits no edema.  Lymphadenopathy:    She has no cervical adenopathy.  Neurological: She is alert. She has normal reflexes. She displays no tremor. No cranial nerve deficit or sensory deficit. She exhibits normal muscle tone. Coordination normal.  Skin: Skin is warm and dry. No rash noted. No erythema. No pallor.  Psychiatric: She has a normal mood and affect.           Assessment & Plan:

## 2012-01-23 NOTE — Assessment & Plan Note (Signed)
S/p resection  tsh today- has been stable

## 2012-01-23 NOTE — Patient Instructions (Addendum)
Flu shot today  Tetanus shot today Labs today  You are doing great  Follow up in 6 months for annual exam with labs prior  If you are interested in a shingles/zoster vaccine - call your insurance to check on coverage,( you should not get it within 1 month of other vaccines) , then call us for a prescription  for it to take to a pharmacy that gives the shot , or make a nurse visit to get it here depending on your coverage

## 2012-01-23 NOTE — Assessment & Plan Note (Signed)
bp in fair control at this time  No changes needed  Disc lifstyle change with low sodium diet and exercise  Lab today 

## 2012-01-23 NOTE — Assessment & Plan Note (Signed)
On crestor and diet Exp imp with better diet and exercise Lab today

## 2012-01-23 NOTE — Assessment & Plan Note (Signed)
a1c today Exp imp with better diet and exercise Commended  Flu and Tdap vaccines today

## 2012-01-26 ENCOUNTER — Encounter: Payer: Self-pay | Admitting: *Deleted

## 2012-05-03 ENCOUNTER — Other Ambulatory Visit: Payer: Self-pay | Admitting: Family Medicine

## 2012-05-03 NOTE — Telephone Encounter (Signed)
Rx called in as prescribed 

## 2012-05-03 NOTE — Telephone Encounter (Signed)
Ok to refill 

## 2012-05-03 NOTE — Telephone Encounter (Signed)
Px written for call in   

## 2012-07-02 ENCOUNTER — Other Ambulatory Visit: Payer: Self-pay | Admitting: Family Medicine

## 2012-07-02 NOTE — Telephone Encounter (Signed)
CPE scheduled for 07/22/12 and meds refilled until then

## 2012-07-17 ENCOUNTER — Other Ambulatory Visit: Payer: Self-pay | Admitting: Obstetrics and Gynecology

## 2012-07-22 ENCOUNTER — Other Ambulatory Visit: Payer: Self-pay | Admitting: Obstetrics and Gynecology

## 2012-07-22 ENCOUNTER — Encounter: Payer: Self-pay | Admitting: Family Medicine

## 2012-07-22 ENCOUNTER — Ambulatory Visit (INDEPENDENT_AMBULATORY_CARE_PROVIDER_SITE_OTHER): Payer: 59 | Admitting: Family Medicine

## 2012-07-22 VITALS — BP 126/88 | HR 96 | Temp 98.9°F | Ht 64.5 in | Wt 188.5 lb

## 2012-07-22 DIAGNOSIS — R928 Other abnormal and inconclusive findings on diagnostic imaging of breast: Secondary | ICD-10-CM

## 2012-07-22 DIAGNOSIS — R7309 Other abnormal glucose: Secondary | ICD-10-CM

## 2012-07-22 DIAGNOSIS — Z0000 Encounter for general adult medical examination without abnormal findings: Secondary | ICD-10-CM

## 2012-07-22 DIAGNOSIS — N390 Urinary tract infection, site not specified: Secondary | ICD-10-CM | POA: Insufficient documentation

## 2012-07-22 DIAGNOSIS — E785 Hyperlipidemia, unspecified: Secondary | ICD-10-CM

## 2012-07-22 DIAGNOSIS — I10 Essential (primary) hypertension: Secondary | ICD-10-CM

## 2012-07-22 DIAGNOSIS — R350 Frequency of micturition: Secondary | ICD-10-CM

## 2012-07-22 LAB — LIPID PANEL
Cholesterol: 131 mg/dL (ref 0–200)
LDL Cholesterol: 55 mg/dL (ref 0–99)
Triglycerides: 172 mg/dL — ABNORMAL HIGH (ref 0.0–149.0)
VLDL: 34.4 mg/dL (ref 0.0–40.0)

## 2012-07-22 LAB — POCT URINALYSIS DIPSTICK
Ketones, UA: NEGATIVE
Nitrite, UA: NEGATIVE
Urobilinogen, UA: 0.2
pH, UA: 6.5

## 2012-07-22 LAB — POCT UA - MICROSCOPIC ONLY
Casts, Ur, LPF, POC: 0
Yeast, UA: 0

## 2012-07-22 LAB — COMPREHENSIVE METABOLIC PANEL
ALT: 20 U/L (ref 0–35)
AST: 21 U/L (ref 0–37)
Calcium: 9.5 mg/dL (ref 8.4–10.5)
Chloride: 101 mEq/L (ref 96–112)
Creatinine, Ser: 0.6 mg/dL (ref 0.4–1.2)
Sodium: 136 mEq/L (ref 135–145)

## 2012-07-22 MED ORDER — MOMETASONE FUROATE 50 MCG/ACT NA SUSP: 2.0000 | Freq: Every day | NASAL | Status: DC

## 2012-07-22 MED ORDER — LISINOPRIL-HYDROCHLOROTHIAZIDE 10-12.5 MG PO TABS: 1.0000 | ORAL_TABLET | Freq: Every day | ORAL | Status: DC

## 2012-07-22 MED ORDER — CIPROFLOXACIN HCL 250 MG PO TABS: 250.0000 mg | ORAL_TABLET | Freq: Two times a day (BID) | ORAL | Status: DC

## 2012-07-22 MED ORDER — PAROXETINE HCL 40 MG PO TABS: 40.0000 mg | ORAL_TABLET | Freq: Every day | ORAL | Status: DC

## 2012-07-22 MED ORDER — ROSUVASTATIN CALCIUM 10 MG PO TABS: 10.0000 mg | ORAL_TABLET | Freq: Every day | ORAL | Status: DC

## 2012-07-22 NOTE — Assessment & Plan Note (Signed)
a1c today  Disc imp of healthy low sugar diet and exercise and wt loss to prevent DM

## 2012-07-22 NOTE — Assessment & Plan Note (Signed)
Pos ua and urinary symptoms  tx with cipro for 5 d  F/u 2-3 weeks - will re check ua then- to makes sure rbc are gone

## 2012-07-22 NOTE — Progress Notes (Signed)
Subjective:    Patient ID: Anita Harrell, female    DOB: August 26, 1950, 62 y.o.   MRN: [id number]  HPI Here for health maintenance exam and to review chronic medical problems   Also symptoms of uti  Urinary symptoms-is having some lower back pain and feeling like she does not quite empty- some frequency No blood in urine She drinks lots of water   Wt is down 3 lb with bmi of 31  Zoster status- she has not called her insurance yet about getting the vaccine (she wants to get vaccine today regardless of coverage) Flu vaccine 11/13 Td 11/13  Mammogram was last week 5/14 Self exam no lumps or changes   Gyn-Dr Tenny Craw Pap recently-pending result   colonsoc 3/06-pt thinks it was a 10 year recall   bp is stable today  No cp or palpitations or headaches or edema  No side effects to medicines  BP Readings from Last 3 Encounters:  07/22/12 126/88  01/23/12 112/82  03/27/11 136/74     Due for labs Hyperglycemia  Lab Results  Component Value Date   HGBA1C 6.1 01/23/2012    Ate light this am for labs    Patient Active Problem List   Diagnosis Date Noted  . LEUKOCYTOSIS 03/30/2010  . HYPERGLYCEMIA, MILD 02/21/2008  . HYPERTENSION, BENIGN ESSENTIAL 01/22/2008  . ALLERGIC RHINITIS 07/25/2007  . BACK PAIN 06/26/2007  . Personal history of goiter 07/27/2006  . HYPERLIPIDEMIA 07/27/2006  . ANXIETY 07/27/2006  . PANIC DISORDER 07/27/2006  . Former smoker 07/27/2006  . DEPRESSION 07/27/2006  . INSOMNIA, HX OF 07/27/2006   Past Medical History  Diagnosis Date  . Chest pain   . Epigastric pain   . Hypertension   . Hyperglycemia     mild  . Allergy     allergic rhinitis  . Back pain   . Goiter   . History of tobacco abuse   . Panic disorder     History of  . Insomnia     Hx of  . Hyperlipidemia   . Depression   . Anxiety   . Labile blood pressure    Past Surgical History  Procedure Laterality Date  . Elbow surgery  2004  . Thyroid surgery      B9 massess    History  Substance Use Topics  . Smoking status: Former Smoker    Quit date: 12/11/2008  . Smokeless tobacco: Never Used  . Alcohol Use: No   Family History  Problem Relation Age of Onset  . Hypertension Mother   . Stroke Mother   . Alcohol abuse Father   . Cancer Father     lung CA  . Heart disease Father     CHF  . Cancer Sister     breast CA  . Heart disease Sister     CHF from chemotx also has defib   Allergies  Allergen Reactions  . Codeine     REACTION: Nausea and vomiting   Current Outpatient Prescriptions on File Prior to Visit  Medication Sig Dispense Refill  . aspirin 81 MG tablet Take 81 mg by mouth daily.      . CRESTOR 10 MG tablet TAKE 1 TABLET BY MOUTH AT BEDTIME  30 tablet  0  . lisinopril-hydrochlorothiazide (PRINZIDE,ZESTORETIC) 10-12.5 MG per tablet TAKE 1 TABLET BY MOUTH EVERY MORNING  30 tablet  0  . Multiple Vitamin (MULTIVITAMIN) tablet Take 1 tablet by mouth every other day.      Marland Kitchen  NASONEX 50 MCG/ACT nasal spray PLACE 2 SPRAYS INTO THE NOSE DAILY. 2 SPRAYS IN EACH NOSTRIL ONCE DAILY AS NEEDED.  17 g  0  . Omega-3 Fatty Acids (FISH OIL PO) Take 1 capsule by mouth daily.      Marland Kitchen PARoxetine (PAXIL) 40 MG tablet TAKE 1 TABLET BY MOUTH EVERY DAY  30 tablet  0  . zolpidem (AMBIEN CR) 12.5 MG CR tablet TAKE 1 TABLET BY MOUTH AT BEDTIME AS NEEDED  30 tablet  3   No current facility-administered medications on file prior to visit.    Review of Systems Review of Systems  Constitutional: Negative for fever, appetite change, fatigue and unexpected weight change.  Eyes: Negative for pain and visual disturbance.  Respiratory: Negative for cough and shortness of breath.   Cardiovascular: Negative for cp or palpitations    Gastrointestinal: Negative for nausea, diarrhea and constipation.  Genitourinary: pos  for urgency and frequency. neg for blood in urine or flank pain , pos for low back pain  Skin: Negative for pallor or rash   Neurological: Negative for  weakness, light-headedness, numbness and headaches.  Hematological: Negative for adenopathy. Does not bruise/bleed easily.  Psychiatric/Behavioral: Negative for dysphoric mood. The patient is not nervous/anxious.         Objective:   Physical Exam  Constitutional: She appears well-developed and well-nourished. No distress.  obese and well appearing   HENT:  Head: Normocephalic and atraumatic.  Right Ear: External ear normal.  Left Ear: External ear normal.  Nose: Nose normal.  Mouth/Throat: Oropharynx is clear and moist.  Eyes: Conjunctivae and EOM are normal. Pupils are equal, round, and reactive to light. Right eye exhibits no discharge. Left eye exhibits no discharge. No scleral icterus.  Neck: Normal range of motion. Neck supple. No JVD present. Carotid bruit is not present. No thyromegaly present.  Cardiovascular: Normal rate, regular rhythm, normal heart sounds and intact distal pulses.  Exam reveals no gallop.   Pulmonary/Chest: Effort normal and breath sounds normal. No respiratory distress. She has no wheezes.  Abdominal: Soft. Bowel sounds are normal. She exhibits no distension, no abdominal bruit and no mass. There is tenderness. There is no rebound and no guarding.  Mild suprapubic tenderness   Musculoskeletal: She exhibits no edema and no tenderness.  No cva tenderness   Lymphadenopathy:    She has no cervical adenopathy.  Neurological: She is alert. She has normal reflexes. No cranial nerve deficit. She exhibits normal muscle tone. Coordination normal.  Skin: Skin is warm and dry. No rash noted. No erythema. No pallor.  Psychiatric: She has a normal mood and affect.          Assessment & Plan:

## 2012-07-22 NOTE — Assessment & Plan Note (Signed)
Lipids today  Rev low sat fat diet  Pt tolerates crestor well

## 2012-07-22 NOTE — Assessment & Plan Note (Addendum)
Reviewed health habits including diet and exercise and skin cancer prevention Also reviewed health mt list, fam hx and immunizations   Wellness labs done today Will get zostavax at f/u appt

## 2012-07-22 NOTE — Assessment & Plan Note (Signed)
bp is stable today  No cp or palpitations or headaches or edema  No side effects to medicines  BP Readings from Last 3 Encounters:  07/22/12 126/88  01/23/12 112/82  03/27/11 136/74    Labs today

## 2012-07-22 NOTE — Patient Instructions (Addendum)
Take the cipro as directed for urinary tract infection Drink lots of water  Follow up with me in about 2-3 weeks to review lab results and re check urine  We can do shingles vaccine then also -- check with your insurance for coverage

## 2012-08-06 ENCOUNTER — Other Ambulatory Visit: Payer: 59

## 2012-08-08 ENCOUNTER — Ambulatory Visit
Admission: RE | Admit: 2012-08-08 | Discharge: 2012-08-08 | Disposition: A | Discharge status: Home / Self Care | Payer: 59 | Source: Ambulatory Visit | Attending: Obstetrics and Gynecology | Admitting: Obstetrics and Gynecology

## 2012-08-08 ENCOUNTER — Other Ambulatory Visit: Payer: Self-pay | Admitting: Obstetrics and Gynecology

## 2012-08-08 DIAGNOSIS — R928 Other abnormal and inconclusive findings on diagnostic imaging of breast: Secondary | ICD-10-CM

## 2012-08-12 ENCOUNTER — Encounter: Payer: Self-pay | Admitting: Radiology

## 2012-08-12 ENCOUNTER — Other Ambulatory Visit: Payer: Self-pay | Admitting: Obstetrics and Gynecology

## 2012-08-12 DIAGNOSIS — R319 Hematuria, unspecified: Secondary | ICD-10-CM

## 2012-08-13 ENCOUNTER — Encounter: Payer: Self-pay | Admitting: Family Medicine

## 2012-08-13 ENCOUNTER — Ambulatory Visit (INDEPENDENT_AMBULATORY_CARE_PROVIDER_SITE_OTHER): Payer: 59 | Admitting: Family Medicine

## 2012-08-13 VITALS — BP 110/76 | HR 112 | Temp 98.4°F | Ht 64.5 in | Wt 182.8 lb

## 2012-08-13 DIAGNOSIS — R7309 Other abnormal glucose: Secondary | ICD-10-CM

## 2012-08-13 DIAGNOSIS — R3129 Other microscopic hematuria: Secondary | ICD-10-CM | POA: Insufficient documentation

## 2012-08-13 DIAGNOSIS — Z87440 Personal history of urinary (tract) infections: Secondary | ICD-10-CM

## 2012-08-13 LAB — POCT URINALYSIS DIPSTICK
Bilirubin, UA: NEGATIVE
Glucose, UA: NEGATIVE
Ketones, UA: NEGATIVE
Leukocytes, UA: NEGATIVE
Nitrite, UA: NEGATIVE
Protein, UA: NEGATIVE
Spec Grav, UA: <=1.005
Urobilinogen, UA: 0.2
pH, UA: 6.5

## 2012-08-13 MED ORDER — ZOLPIDEM TARTRATE ER 12.5 MG PO TBCR: 12.5000 mg | EXTENDED_RELEASE_TABLET | Freq: Every evening | ORAL | Status: DC | PRN

## 2012-08-13 NOTE — Progress Notes (Signed)
Subjective:    Patient ID: Anita Harrell, female    DOB: Nov 11, 1950, 62 y.o.   MRN: [id number]  HPI Here for f/u of uti   Last visit tx with cipro for urinary symptoms and abn ua Today there is still blood in urine micro  Hyperglycemia Lab Results  Component Value Date   HGBA1C 6.4 07/22/2012    Wt is down a few lb  She is really working on that now -and happy with that  Now she is eating some more regular meals/ better choices and also riding bike and more exercise later   Other labs ok   She had her gyn appt - abn pap and also mammo She will be getting urogram - already scheduled  - Dr Tenny Craw  Also neg cx  Is having some pain in her low back and R pelvic  Has f/u with him last week for ? colposcopy  Patient Active Problem List   Diagnosis Date Noted  . Microscopic hematuria 08/13/2012  . Routine general medical examination at a health care facility 07/22/2012  . LEUKOCYTOSIS 03/30/2010  . HYPERGLYCEMIA, MILD 02/21/2008  . HYPERTENSION, BENIGN ESSENTIAL 01/22/2008  . ALLERGIC RHINITIS 07/25/2007  . BACK PAIN 06/26/2007  . Personal history of goiter 07/27/2006  . HYPERLIPIDEMIA 07/27/2006  . ANXIETY 07/27/2006  . PANIC DISORDER 07/27/2006  . Former smoker 07/27/2006  . DEPRESSION 07/27/2006  . INSOMNIA, HX OF 07/27/2006   Past Medical History  Diagnosis Date  . Chest pain   . Epigastric pain   . Hypertension   . Hyperglycemia     mild  . Allergy     allergic rhinitis  . Back pain   . Goiter   . History of tobacco abuse   . Panic disorder     History of  . Insomnia     Hx of  . Hyperlipidemia   . Depression   . Anxiety   . Labile blood pressure    Past Surgical History  Procedure Laterality Date  . Elbow surgery  2004  . Thyroid surgery      B9 massess   History  Substance Use Topics  . Smoking status: Former Smoker    Quit date: 12/11/2008  . Smokeless tobacco: Never Used  . Alcohol Use: No   Family History  Problem Relation Age of Onset   . Hypertension Mother   . Stroke Mother   . Alcohol abuse Father   . Cancer Father     lung CA  . Heart disease Father     CHF  . Cancer Sister     breast CA  . Heart disease Sister     CHF from chemotx also has defib   Allergies  Allergen Reactions  . Codeine     REACTION: Nausea and vomiting   Current Outpatient Prescriptions on File Prior to Visit  Medication Sig Dispense Refill  . aspirin 81 MG tablet Take 81 mg by mouth daily.      Marland Kitchen lisinopril-hydrochlorothiazide (PRINZIDE,ZESTORETIC) 10-12.5 MG per tablet Take 1 tablet by mouth daily.  30 tablet  11  . mometasone (NASONEX) 50 MCG/ACT nasal spray Place 2 sprays into the nose daily.  17 g  11  . Multiple Vitamin (MULTIVITAMIN) tablet Take 1 tablet by mouth daily.       . Omega-3 Fatty Acids (FISH OIL PO) Take 1 capsule by mouth daily.      Marland Kitchen PARoxetine (PAXIL) 40 MG tablet Take 1 tablet (40 mg total)  by mouth daily.  30 tablet  11  . rosuvastatin (CRESTOR) 10 MG tablet Take 1 tablet (10 mg total) by mouth daily.  30 tablet  11   No current facility-administered medications on file prior to visit.     Review of Systems Review of Systems  Constitutional: Negative for fever, appetite change, fatigue and unexpected weight change. neg for excessive thirst  Eyes: Negative for pain and visual disturbance.  Respiratory: Negative for cough and shortness of breath.   Cardiovascular: Negative for cp or palpitations    Gastrointestinal: Negative for nausea, diarrhea and constipation.  Genitourinary: Negative for urgency and frequency. neg for dysuria or gross hematuria, neg for pelvic pain but pos for low back pain  Skin: Negative for pallor or rash   Neurological: Negative for weakness, light-headedness, numbness and headaches.  Hematological: Negative for adenopathy. Does not bruise/bleed easily.  Psychiatric/Behavioral: Negative for dysphoric mood. The patient is not nervous/anxious.         Objective:   Physical Exam   Constitutional: She appears well-developed and well-nourished. No distress.  obese and well appearing   HENT:  Head: Normocephalic and atraumatic.  Mouth/Throat: Oropharynx is clear and moist.  Eyes: Conjunctivae and EOM are normal. Pupils are equal, round, and reactive to light. Right eye exhibits no discharge. Left eye exhibits no discharge. No scleral icterus.  Neck: Normal range of motion. Neck supple. No JVD present. Carotid bruit is not present. No thyromegaly present.  Cardiovascular: Normal rate and regular rhythm.   Pulmonary/Chest: Effort normal and breath sounds normal.  Abdominal: Soft. Bowel sounds are normal. She exhibits no distension, no abdominal bruit and no mass. There is no tenderness. There is no rebound, no guarding and no CVA tenderness.  Musculoskeletal: She exhibits no edema.  Lymphadenopathy:    She has no cervical adenopathy.  Neurological: She is alert.  Skin: Skin is warm and dry. No rash noted. No erythema. No pallor.  Psychiatric: She has a normal mood and affect.          Assessment & Plan:

## 2012-08-13 NOTE — Assessment & Plan Note (Signed)
cx neg Urogram ordered for tomorrow with Dr Tenny Craw Pending results

## 2012-08-13 NOTE — Patient Instructions (Addendum)
Get the tests done with Dr Tenny Craw Urine still had blood in it today  Update me if any problems Keep up the great work with diet and exercise Schedule non fasting labs in 3 mo

## 2012-08-13 NOTE — Assessment & Plan Note (Signed)
Pt is working hard on low glycemic diet and wt loss and doing very well Will re check a1c in 3 mo - exp imp

## 2012-08-14 ENCOUNTER — Ambulatory Visit
Admission: RE | Admit: 2012-08-14 | Discharge: 2012-08-14 | Disposition: A | Discharge status: Home / Self Care | Payer: 59 | Source: Ambulatory Visit | Attending: Obstetrics and Gynecology | Admitting: Obstetrics and Gynecology

## 2012-08-14 DIAGNOSIS — R319 Hematuria, unspecified: Secondary | ICD-10-CM

## 2012-08-27 ENCOUNTER — Encounter: Payer: Self-pay | Admitting: Family Medicine

## 2012-08-30 ENCOUNTER — Other Ambulatory Visit: Payer: Self-pay | Admitting: Obstetrics and Gynecology

## 2012-09-17 ENCOUNTER — Encounter (HOSPITAL_COMMUNITY): Payer: Self-pay

## 2012-09-17 ENCOUNTER — Encounter (HOSPITAL_COMMUNITY)
Admission: RE | Admit: 2012-09-17 | Discharge: 2012-09-17 | Disposition: A | Discharge status: Home / Self Care | Payer: 59 | Source: Ambulatory Visit | Attending: Obstetrics and Gynecology | Admitting: Obstetrics and Gynecology

## 2012-09-17 DIAGNOSIS — Z01818 Encounter for other preprocedural examination: Secondary | ICD-10-CM | POA: Insufficient documentation

## 2012-09-17 DIAGNOSIS — Z01812 Encounter for preprocedural laboratory examination: Secondary | ICD-10-CM | POA: Insufficient documentation

## 2012-09-17 LAB — BASIC METABOLIC PANEL
BUN: 9 mg/dL (ref 6–23)
Calcium: 10.1 mg/dL (ref 8.4–10.5)
Chloride: 99 mEq/L (ref 96–112)
Creatinine, Ser: 0.58 mg/dL (ref 0.50–1.10)
GFR calc Af Amer: >90 mL/min (ref >90)
GFR calc non Af Amer: >90 mL/min (ref >90)

## 2012-09-17 LAB — CBC
HCT: 36.7 % (ref 36.0–46.0)
MCHC: 34.6 g/dL (ref 30.0–36.0)
Platelets: 191 10*3/uL (ref 150–400)
RDW: 12.7 % (ref 11.5–15.5)

## 2012-09-17 LAB — PROTIME-INR: INR: 0.96 (ref 0.00–1.49)

## 2012-09-17 NOTE — Patient Instructions (Addendum)
Your procedure is scheduled on:09/24/12  Enter through the Main Entrance at : 8am  Pick up desk phone and dial [phone] and inform us of your arrival.  Please call [phone] if you have any problems the morning of surgery.  Remember: Do not eat food or drink liquids, including water, after midnight: Monday   You may brush your teeth the morning of surgery.   DO NOT wear jewelry, eye make-up, lipstick,body lotion, or dark fingernail polish.  (Polished toes are ok) You may wear deodorant.  If you are to be admitted after surgery, leave suitcase in car until your room has been assigned. Patients discharged on the day of surgery will not be allowed to drive home. Wear loose fitting, comfortable clothes for your ride home.

## 2012-09-18 ENCOUNTER — Encounter (HOSPITAL_COMMUNITY): Payer: Self-pay | Admitting: Pharmacist

## 2012-09-23 NOTE — H&P (Signed)
Anita Harrell is an 62 y.o. female noted to have left adnexal mass that on ultrasound is about 5cm and mainly a simple cyst but does contain a 1cm nodule. CA 125 is within normal limits. Also she has had a pap smear showing CIN 1 which has been assessed with colposcopy and she also has an area on the right labia consistent with condylomata and has condylomata at forchette. She now present to have all these problems treated by a total abdominal hysterectomy and bilateral salping oophorectomy as well as ablation of the vulvar and vaginal condylomata.  Pertinent Gynecological History:  Sexually transmitted diseases: HPV Previous GYN Procedures: colposcopy and vulvar biopsy   Last pap: abnormal: CIN 1 Date: 07/2012    Menstrual History:  No LMP recorded. Patient is postmenopausal.    Past Medical History  Diagnosis Date  . Chest pain   . Epigastric pain   . Hypertension   . Hyperglycemia     mild  . Allergy     allergic rhinitis  . Back pain   . Goiter   . History of tobacco abuse   . Panic disorder     History of  . Insomnia     Hx of  . Hyperlipidemia   . Depression   . Anxiety   . Labile blood pressure     Past Surgical History  Procedure Laterality Date  . Elbow surgery  2004  . Thyroid surgery      B9 massess    Family History  Problem Relation Age of Onset  . Hypertension Mother   . Stroke Mother   . Alcohol abuse Father   . Cancer Father     lung CA  . Heart disease Father     CHF  . Cancer Sister     breast CA  . Heart disease Sister     CHF from chemotx also has defib    Social History:  reports that she quit smoking about 3 years ago. She has never used smokeless tobacco. She reports that she does not drink alcohol or use illicit drugs.  Allergies:  Allergies  Allergen Reactions  . Codeine Nausea And Vomiting    REACTION: Nausea and vomiting Pt has tolerated vicodin & percocet in the past    No prescriptions prior to admission     ROS  Respiratory: no cough or shortness of breath GI: no nausea, vomiting, diarrhea, constipation, or melena GU: No dysuria, frequency or urgency Gyn: no bleeding, discharge or itch. Has warts present at opening of vaginal and right labia   There were no vitals taken for this visit. Physical Exam  Afebrile  V/S stable  Head: normocephalic and atraumatic Neck: Supple, no JVD no thyromegaly Chest: Clearto P&A Heart: regular rhythm no murmur or gallop Abdomen: soft without enlargement of the liver, kidneys, or spleen Back: non tender, no deformity Pelvic Exam   External genitalia: Last 2cm area right labia biopsy proven to be warts   Vagina: Several small condylomata at forchette   Cervix: non tender and without lesion   Uterus is normal size and shape non tender   Adnexa: mass on left non tender, right is negative   No results found for this or any previous visit (from the past 24 hour(s)).  No results found.  Impression: Persistent left adnexal mass, probable cystadenoma, Vulvar and vaginal condylomata.  Plan; TAH BSO and ablation of vulvar and vaginal condylomata.  The risks and benefits have been discussed with the  patient and informed consent has been obtained.  Isaac Lacson 09/23/2012, 11:07 AM

## 2012-09-24 ENCOUNTER — Inpatient Hospital Stay (HOSPITAL_COMMUNITY): Payer: 59 | Admitting: Anesthesiology

## 2012-09-24 ENCOUNTER — Encounter (HOSPITAL_COMMUNITY): Payer: Self-pay | Admitting: Anesthesiology

## 2012-09-24 ENCOUNTER — Encounter (HOSPITAL_COMMUNITY): Payer: Self-pay | Admitting: *Deleted

## 2012-09-24 ENCOUNTER — Inpatient Hospital Stay (HOSPITAL_COMMUNITY)
Admission: RE | Admit: 2012-09-24 | Discharge: 2012-09-26 | DRG: 743 | Disposition: A | Discharge status: Home / Self Care | Payer: 59 | Source: Ambulatory Visit | Attending: Obstetrics and Gynecology | Admitting: Obstetrics and Gynecology

## 2012-09-24 ENCOUNTER — Encounter (HOSPITAL_COMMUNITY)
Admission: RE | Disposition: A | Discharge status: Home / Self Care | Payer: Self-pay | Source: Ambulatory Visit | Attending: Obstetrics and Gynecology

## 2012-09-24 DIAGNOSIS — N840 Polyp of corpus uteri: Secondary | ICD-10-CM | POA: Diagnosis present

## 2012-09-24 DIAGNOSIS — N8300 Follicular cyst of ovary, unspecified side: Secondary | ICD-10-CM | POA: Diagnosis present

## 2012-09-24 DIAGNOSIS — A630 Anogenital (venereal) warts: Secondary | ICD-10-CM | POA: Diagnosis present

## 2012-09-24 DIAGNOSIS — N870 Mild cervical dysplasia: Secondary | ICD-10-CM | POA: Diagnosis present

## 2012-09-24 DIAGNOSIS — N8000 Endometriosis of the uterus, unspecified: Secondary | ICD-10-CM | POA: Diagnosis present

## 2012-09-24 DIAGNOSIS — D279 Benign neoplasm of unspecified ovary: Principal | ICD-10-CM | POA: Diagnosis present

## 2012-09-24 DIAGNOSIS — N9489 Other specified conditions associated with female genital organs and menstrual cycle: Secondary | ICD-10-CM | POA: Diagnosis present

## 2012-09-24 DIAGNOSIS — D271 Benign neoplasm of left ovary: Secondary | ICD-10-CM

## 2012-09-24 DIAGNOSIS — D72829 Elevated white blood cell count, unspecified: Secondary | ICD-10-CM | POA: Diagnosis present

## 2012-09-24 DIAGNOSIS — I10 Essential (primary) hypertension: Secondary | ICD-10-CM | POA: Diagnosis present

## 2012-09-24 DIAGNOSIS — D251 Intramural leiomyoma of uterus: Secondary | ICD-10-CM | POA: Diagnosis present

## 2012-09-24 DIAGNOSIS — D252 Subserosal leiomyoma of uterus: Secondary | ICD-10-CM | POA: Diagnosis present

## 2012-09-24 HISTORY — PX: SALPINGOOPHORECTOMY: SHX82

## 2012-09-24 HISTORY — PX: WART FULGURATION: SHX5245

## 2012-09-24 HISTORY — PX: ABDOMINAL HYSTERECTOMY: SHX81

## 2012-09-24 LAB — HEMOGLOBIN: Hemoglobin: 12 g/dL (ref 12.0–15.0)

## 2012-09-24 SURGERY — HYSTERECTOMY, ABDOMINAL
Anesthesia: General | Site: Vulva | Wound class: Clean Contaminated

## 2012-09-24 MED ORDER — SCOPOLAMINE 1 MG/3DAYS TD PT72
1.0000 | MEDICATED_PATCH | Freq: Once | TRANSDERMAL | Status: DC
  Filled 2012-09-24: qty 1

## 2012-09-24 MED ORDER — MIDAZOLAM HCL 2 MG/2ML IJ SOLN
INTRAMUSCULAR | Status: AC
  Filled 2012-09-24: qty 2

## 2012-09-24 MED ORDER — LIDOCAINE HCL 1 % IJ SOLN
INTRAMUSCULAR | Status: DC | PRN
  Administered 2012-09-24: 10 mL

## 2012-09-24 MED ORDER — DIPHENHYDRAMINE HCL 12.5 MG/5ML PO ELIX: 12.5000 mg | ORAL_SOLUTION | Freq: Four times a day (QID) | ORAL | Status: DC | PRN

## 2012-09-24 MED ORDER — PROPOFOL 10 MG/ML IV EMUL
INTRAVENOUS | Status: AC
  Filled 2012-09-24: qty 20

## 2012-09-24 MED ORDER — DEXAMETHASONE SODIUM PHOSPHATE 10 MG/ML IJ SOLN
INTRAMUSCULAR | Status: AC
  Filled 2012-09-24: qty 1

## 2012-09-24 MED ORDER — MORPHINE SULFATE 0.5 MG/ML IJ SOLN
INTRAMUSCULAR | Status: AC
  Filled 2012-09-24: qty 10

## 2012-09-24 MED ORDER — ONDANSETRON HCL 4 MG/2ML IJ SOLN: 4.0000 mg | Freq: Four times a day (QID) | INTRAMUSCULAR | Status: DC | PRN

## 2012-09-24 MED ORDER — KETOROLAC TROMETHAMINE 30 MG/ML IJ SOLN
30.0000 mg | Freq: Four times a day (QID) | INTRAMUSCULAR | Status: AC | PRN
  Filled 2012-09-24: qty 1

## 2012-09-24 MED ORDER — DIPHENHYDRAMINE HCL 50 MG/ML IJ SOLN
12.5000 mg | INTRAMUSCULAR | Status: DC | PRN
  Administered 2012-09-24: 12.5 mg via INTRAVENOUS
  Filled 2012-09-24: qty 1

## 2012-09-24 MED ORDER — DIPHENHYDRAMINE HCL 50 MG/ML IJ SOLN: 12.5000 mg | Freq: Four times a day (QID) | INTRAMUSCULAR | Status: DC | PRN

## 2012-09-24 MED ORDER — NALOXONE HCL 0.4 MG/ML IJ SOLN: 0.4000 mg | INTRAMUSCULAR | Status: DC | PRN

## 2012-09-24 MED ORDER — ROCURONIUM BROMIDE 50 MG/5ML IV SOLN
INTRAVENOUS | Status: AC
  Filled 2012-09-24: qty 1

## 2012-09-24 MED ORDER — NALBUPHINE HCL 10 MG/ML IJ SOLN
5.0000 mg | INTRAMUSCULAR | Status: DC | PRN
  Filled 2012-09-24: qty 1

## 2012-09-24 MED ORDER — HEPARIN SODIUM (PORCINE) 5000 UNIT/ML IJ SOLN
INTRAMUSCULAR | Status: DC | PRN
  Administered 2012-09-24: 5000 [IU]

## 2012-09-24 MED ORDER — METOCLOPRAMIDE HCL 5 MG/ML IJ SOLN: 10.0000 mg | Freq: Three times a day (TID) | INTRAMUSCULAR | Status: DC | PRN

## 2012-09-24 MED ORDER — MEPERIDINE HCL 25 MG/ML IJ SOLN
INTRAMUSCULAR | Status: AC
  Administered 2012-09-24: 6.25 mg via INTRAVENOUS
  Filled 2012-09-24: qty 1

## 2012-09-24 MED ORDER — LACTATED RINGERS IV SOLN
INTRAVENOUS | Status: DC
  Administered 2012-09-24 (×3): via INTRAVENOUS

## 2012-09-24 MED ORDER — CEFAZOLIN SODIUM 1-5 GM-% IV SOLN
1.0000 g | Freq: Three times a day (TID) | INTRAVENOUS | Status: AC
  Administered 2012-09-24 – 2012-09-25 (×3): 1 g via INTRAVENOUS
  Filled 2012-09-24 (×4): qty 50

## 2012-09-24 MED ORDER — MIDAZOLAM HCL 5 MG/5ML IJ SOLN
INTRAMUSCULAR | Status: DC | PRN
  Administered 2012-09-24 (×4): 1 mg via INTRAVENOUS

## 2012-09-24 MED ORDER — MEPERIDINE HCL 25 MG/ML IJ SOLN: 6.2500 mg | INTRAMUSCULAR | Status: DC | PRN

## 2012-09-24 MED ORDER — FENTANYL CITRATE 0.05 MG/ML IJ SOLN
INTRAMUSCULAR | Status: DC | PRN
  Administered 2012-09-24: 100 ug via INTRAVENOUS
  Administered 2012-09-24 (×3): 50 ug via INTRAVENOUS

## 2012-09-24 MED ORDER — SILVER SULFADIAZINE 1 % EX CREA
TOPICAL_CREAM | CUTANEOUS | Status: AC
  Filled 2012-09-24: qty 50

## 2012-09-24 MED ORDER — PHENYLEPHRINE HCL 10 MG/ML IJ SOLN
INTRAMUSCULAR | Status: DC | PRN
  Administered 2012-09-24 (×2): 40 ug via INTRAVENOUS
  Administered 2012-09-24 (×2): 80 ug via INTRAVENOUS
  Administered 2012-09-24: 40 ug via INTRAVENOUS
  Administered 2012-09-24: 80 ug via INTRAVENOUS
  Administered 2012-09-24: 40 ug via INTRAVENOUS

## 2012-09-24 MED ORDER — SODIUM CHLORIDE 0.9 % IJ SOLN: 9.0000 mL | INTRAMUSCULAR | Status: DC | PRN

## 2012-09-24 MED ORDER — KETOROLAC TROMETHAMINE 30 MG/ML IJ SOLN: 30.0000 mg | Freq: Four times a day (QID) | INTRAMUSCULAR | Status: AC | PRN

## 2012-09-24 MED ORDER — CEFAZOLIN SODIUM-DEXTROSE 2-3 GM-% IV SOLR
INTRAVENOUS | Status: AC
  Filled 2012-09-24: qty 50

## 2012-09-24 MED ORDER — MORPHINE SULFATE (PF) 0.5 MG/ML IJ SOLN
INTRAMUSCULAR | Status: DC | PRN
  Administered 2012-09-24: 2 mg via EPIDURAL

## 2012-09-24 MED ORDER — IBUPROFEN 600 MG PO TABS
600.0000 mg | ORAL_TABLET | Freq: Four times a day (QID) | ORAL | Status: DC | PRN
  Administered 2012-09-26 (×2): 600 mg via ORAL
  Filled 2012-09-24 (×2): qty 1

## 2012-09-24 MED ORDER — ACETAMINOPHEN 10 MG/ML IV SOLN
1000.0000 mg | Freq: Four times a day (QID) | INTRAVENOUS | Status: AC | PRN
  Filled 2012-09-24: qty 100

## 2012-09-24 MED ORDER — ONDANSETRON HCL 4 MG/2ML IJ SOLN
INTRAMUSCULAR | Status: DC | PRN
  Administered 2012-09-24: 4 mg via INTRAVENOUS

## 2012-09-24 MED ORDER — ONDANSETRON HCL 4 MG/2ML IJ SOLN
INTRAMUSCULAR | Status: AC
  Filled 2012-09-24: qty 2

## 2012-09-24 MED ORDER — ONDANSETRON HCL 4 MG/2ML IJ SOLN: 4.0000 mg | Freq: Three times a day (TID) | INTRAMUSCULAR | Status: DC | PRN

## 2012-09-24 MED ORDER — 0.9 % SODIUM CHLORIDE (POUR BTL) OPTIME
TOPICAL | Status: DC | PRN
  Administered 2012-09-24: 1000 mL

## 2012-09-24 MED ORDER — NALOXONE HCL 1 MG/ML IJ SOLN: 1.0000 ug/kg/h | INTRAMUSCULAR | Status: DC | PRN

## 2012-09-24 MED ORDER — PHENYLEPHRINE 40 MCG/ML (10ML) SYRINGE FOR IV PUSH (FOR BLOOD PRESSURE SUPPORT)
PREFILLED_SYRINGE | INTRAVENOUS | Status: AC
  Filled 2012-09-24: qty 5

## 2012-09-24 MED ORDER — MORPHINE SULFATE (PF) 0.5 MG/ML IJ SOLN
INTRAMUSCULAR | Status: DC | PRN
  Administered 2012-09-24: .1 mg via INTRATHECAL

## 2012-09-24 MED ORDER — FENTANYL CITRATE 0.05 MG/ML IJ SOLN
INTRAMUSCULAR | Status: AC
  Filled 2012-09-24: qty 2

## 2012-09-24 MED ORDER — SODIUM CHLORIDE 0.9 % IJ SOLN: 3.0000 mL | INTRAMUSCULAR | Status: DC | PRN

## 2012-09-24 MED ORDER — DIPHENHYDRAMINE HCL 25 MG PO CAPS
25.0000 mg | ORAL_CAPSULE | ORAL | Status: DC | PRN
  Administered 2012-09-26: 25 mg via ORAL
  Filled 2012-09-24: qty 1

## 2012-09-24 MED ORDER — CEFAZOLIN SODIUM-DEXTROSE 2-3 GM-% IV SOLR
2.0000 g | INTRAVENOUS | Status: AC
  Administered 2012-09-24: 2 g via INTRAVENOUS

## 2012-09-24 MED ORDER — MORPHINE SULFATE (PF) 1 MG/ML IV SOLN
INTRAVENOUS | Status: DC
  Administered 2012-09-24: 14:00:00 via INTRAVENOUS
  Administered 2012-09-24: 0.94 mg via INTRAVENOUS
  Administered 2012-09-24: 22:00:00 via INTRAVENOUS
  Administered 2012-09-24: 22.5 mg via INTRAVENOUS
  Administered 2012-09-25: 24 mL via INTRAVENOUS
  Administered 2012-09-25: 1.54 mg via INTRAVENOUS
  Administered 2012-09-25: 07:00:00 via INTRAVENOUS
  Administered 2012-09-25: 22 mg via INTRAVENOUS
  Administered 2012-09-25: 5 mL via INTRAVENOUS
  Filled 2012-09-24 (×3): qty 25

## 2012-09-24 MED ORDER — FENTANYL CITRATE 0.05 MG/ML IJ SOLN
INTRAMUSCULAR | Status: AC
  Filled 2012-09-24: qty 5

## 2012-09-24 MED ORDER — PROPOFOL INFUSION 10 MG/ML OPTIME
INTRAVENOUS | Status: DC | PRN
  Administered 2012-09-24: 25 ug/kg/min via INTRAVENOUS

## 2012-09-24 MED ORDER — BUPIVACAINE HCL (PF) 0.75 % IJ SOLN
INTRAMUSCULAR | Status: DC | PRN
  Administered 2012-09-24: 2 mL via INTRATHECAL

## 2012-09-24 MED ORDER — KETOROLAC TROMETHAMINE 30 MG/ML IJ SOLN
INTRAMUSCULAR | Status: AC
  Administered 2012-09-24: 30 mg via INTRAVENOUS
  Filled 2012-09-24: qty 1

## 2012-09-24 MED ORDER — DEXAMETHASONE SODIUM PHOSPHATE 10 MG/ML IJ SOLN
INTRAMUSCULAR | Status: DC | PRN
  Administered 2012-09-24: 10 mg via INTRAVENOUS

## 2012-09-24 MED ORDER — OXYCODONE-ACETAMINOPHEN 5-325 MG PO TABS
1.0000 | ORAL_TABLET | ORAL | Status: DC | PRN
  Administered 2012-09-25 – 2012-09-26 (×5): 2 via ORAL
  Filled 2012-09-24 (×5): qty 2

## 2012-09-24 MED ORDER — KETOROLAC TROMETHAMINE 30 MG/ML IJ SOLN: 15.0000 mg | Freq: Once | INTRAMUSCULAR | Status: AC | PRN

## 2012-09-24 MED ORDER — DIPHENHYDRAMINE HCL 50 MG/ML IJ SOLN: 25.0000 mg | INTRAMUSCULAR | Status: DC | PRN

## 2012-09-24 MED ORDER — LACTATED RINGERS IV SOLN
INTRAVENOUS | Status: DC
  Administered 2012-09-24 – 2012-09-25 (×4): via INTRAVENOUS

## 2012-09-24 MED ORDER — ACETAMINOPHEN 10 MG/ML IV SOLN
INTRAVENOUS | Status: AC
  Filled 2012-09-24: qty 100

## 2012-09-24 MED ORDER — MENTHOL 3 MG MT LOZG: 1.0000 | LOZENGE | OROMUCOSAL | Status: DC | PRN

## 2012-09-24 MED ORDER — LIDOCAINE HCL (CARDIAC) 20 MG/ML IV SOLN
INTRAVENOUS | Status: AC
  Filled 2012-09-24: qty 5

## 2012-09-24 SURGICAL SUPPLY — 52 items
APL SKNCLS STERI-STRIP NONHPOA (GAUZE/BANDAGES/DRESSINGS)
APPLICATOR COTTON TIP 6IN STRL (MISCELLANEOUS) IMPLANT
BENZOIN TINCTURE PRP APPL 2/3 (GAUZE/BANDAGES/DRESSINGS) IMPLANT
CANISTER SUCTION 2500CC (MISCELLANEOUS) ×5 IMPLANT
CLOTH BEACON ORANGE TIMEOUT ST (SAFETY) ×5 IMPLANT
CONT PATH 16OZ SNAP LID 3702 (MISCELLANEOUS) ×5 IMPLANT
DECANTER SPIKE VIAL GLASS SM (MISCELLANEOUS) IMPLANT
DEPRESSOR TONGUE BLADE WOOD (MISCELLANEOUS) ×5 IMPLANT
DRSG OPSITE POSTOP 4X10 (GAUZE/BANDAGES/DRESSINGS) ×2 IMPLANT
ELECT REM PT RETURN 9FT ADLT (ELECTROSURGICAL)
ELECTRODE REM PT RTRN 9FT ADLT (ELECTROSURGICAL) IMPLANT
EVACUATOR PREFILTER SMOKE (MISCELLANEOUS) ×5 IMPLANT
GAUZE SPONGE 4X4 16PLY XRAY LF (GAUZE/BANDAGES/DRESSINGS) IMPLANT
GLOVE BIO SURGEON STRL SZ7.5 (GLOVE) ×5 IMPLANT
GLOVE BIOGEL PI IND STRL 7.5 (GLOVE) ×4 IMPLANT
GLOVE BIOGEL PI INDICATOR 7.5 (GLOVE) ×1
GOWN PREVENTION PLUS LG XLONG (DISPOSABLE) ×10 IMPLANT
GOWN PREVENTION PLUS XXLARGE (GOWN DISPOSABLE) ×5 IMPLANT
HEMOSTAT SURGICEL 4X8 (HEMOSTASIS) ×2 IMPLANT
HOSE NS SMOKE EVAC 7/8 X6 (MISCELLANEOUS) ×5 IMPLANT
NDL SPNL 22GX3.5 QUINCKE BK (NEEDLE) ×3 IMPLANT
NEEDLE SPNL 22GX3.5 QUINCKE BK (NEEDLE) ×5 IMPLANT
NS IRRIG 1000ML POUR BTL (IV SOLUTION) ×5 IMPLANT
PACK ABDOMINAL GYN (CUSTOM PROCEDURE TRAY) ×5 IMPLANT
PACK VAGINAL MINOR WOMEN LF (CUSTOM PROCEDURE TRAY) ×5 IMPLANT
PAD ABD 7.5X8 STRL (GAUZE/BANDAGES/DRESSINGS) ×2 IMPLANT
PAD OB MATERNITY 4.3X12.25 (PERSONAL CARE ITEMS) ×5 IMPLANT
PENCIL BUTTON HOLSTER BLD 10FT (ELECTRODE) IMPLANT
PROTECTOR NERVE ULNAR (MISCELLANEOUS) ×5 IMPLANT
REDUCER FITTING SMOKE EVAC (MISCELLANEOUS) ×5 IMPLANT
SCOPETTES 8  STERILE (MISCELLANEOUS) ×2
SCOPETTES 8 STERILE (MISCELLANEOUS) ×8 IMPLANT
SPONGE LAP 18X18 X RAY DECT (DISPOSABLE) ×10 IMPLANT
STAPLER VISISTAT 35W (STAPLE) IMPLANT
STRIP CLOSURE SKIN 1/2X4 (GAUZE/BANDAGES/DRESSINGS) IMPLANT
SUT PLAIN 2 0 XLH (SUTURE) ×2 IMPLANT
SUT VIC AB 0 CT1 18XCR BRD8 (SUTURE) ×8 IMPLANT
SUT VIC AB 0 CT1 27 (SUTURE) ×15
SUT VIC AB 0 CT1 27XBRD ANBCTR (SUTURE) ×12 IMPLANT
SUT VIC AB 0 CT1 8-18 (SUTURE) ×10
SUT VIC AB 2-0 CT1 27 (SUTURE)
SUT VIC AB 2-0 CT1 TAPERPNT 27 (SUTURE) IMPLANT
SUT VIC AB 2-0 SH 27 (SUTURE)
SUT VIC AB 2-0 SH 27XBRD (SUTURE) IMPLANT
SUT VIC AB 4-0 PS2 27 (SUTURE) ×2 IMPLANT
SUT VICRYL 0 TIES 12 18 (SUTURE) ×5 IMPLANT
SYR CONTROL 10ML LL (SYRINGE) ×5 IMPLANT
TAPE CLOTH SURG 4X10 WHT LF (GAUZE/BANDAGES/DRESSINGS) ×2 IMPLANT
TOWEL OR 17X24 6PK STRL BLUE (TOWEL DISPOSABLE) ×10 IMPLANT
TRAY FOLEY CATH 14FR (SET/KITS/TRAYS/PACK) ×5 IMPLANT
TUBING SMOKE EVAC HOSE ADAPTER (MISCELLANEOUS) ×5 IMPLANT
WATER STERILE IRR 1000ML POUR (IV SOLUTION) ×5 IMPLANT

## 2012-09-24 NOTE — Brief Op Note (Signed)
09/24/2012  11:56 AM  PATIENT:  Anita Harrell  62 y.o. female  PRE-OPERATIVE DIAGNOSIS:  ADNEXAL MASS / CIN / CONDOLOMA  POST-OPERATIVE DIAGNOSIS:  ADNEXAL MASS / CIN / CONDOLOMA  PROCEDURE:  Procedure(s): HYSTERECTOMY ABDOMINAL (N/A) SALPINGO OOPHORECTOMY (Bilateral) FULGURATION VAGINAL WART (N/A)  SURGEON:  Surgeon(s) and Role:    * Miguel Aschoff, MD - Primary    * W Lodema Hong, MD - Assisting  ANESTHESIA:   spinal  EBL:  Total I/O In: 2000 [I.V.:2000] Out: 280 [Urine:80; Blood:200]  BLOOD ADMINISTERED:none  DRAINS: Urinary Catheter (Foley)   LOCAL MEDICATIONS USED:  LIDOCAINE   SPECIMEN:  Source of Specimen:  uterus, tubes, cervix,ovaries  DISPOSITION OF SPECIMEN:  PATHOLOGY  COUNTS:  YES  TOURNIQUET:  * No tourniquets in log *  DICTATION: .Other Dictation: Dictation Number [id number]  PLAN OF CARE: Admit to inpatient   PATIENT DISPOSITION:  PACU - hemodynamically stable.   Delay start of Pharmacological VTE agent (>24hrs) due to surgical blood loss or risk of bleeding: PAS hose applied

## 2012-09-24 NOTE — Anesthesia Postprocedure Evaluation (Signed)
Anesthesia Post Note  Patient: Anita Harrell  Procedure(s) Performed: Procedure(s) (LRB): HYSTERECTOMY ABDOMINAL (N/A) SALPINGO OOPHORECTOMY (Bilateral) FULGURATION VAGINAL WART (N/A)  Anesthesia type: Spinal  Patient location: PACU  Post pain: Pain level controlled  Post assessment: Post-op Vital signs reviewed  Last Vitals:  Filed Vitals:   09/24/12 1300  BP: 116/67  Pulse: 81  Temp: 36.6 C  Resp: 12    Post vital signs: Reviewed  Level of consciousness: awake  Complications: No apparent anesthesia complications

## 2012-09-24 NOTE — Anesthesia Procedure Notes (Signed)
Spinal  Patient location during procedure: OR Start time: 09/24/2012 10:19 AM Staffing Anesthesiologist: Angus Seller., Harrell Gave. Performed by: anesthesiologist  Preanesthetic Checklist Completed: patient identified, site marked, surgical consent, pre-op evaluation, timeout performed, IV checked, risks and benefits discussed and monitors and equipment checked Spinal Block Patient position: sitting Prep: DuraPrep Patient monitoring: heart rate, cardiac monitor, continuous pulse ox and blood pressure Approach: midline Location: L3-4 Injection technique: single-shot Needle Needle type: Sprotte  Needle gauge: 24 G Needle length: 9 cm Assessment Sensory level: T4 Additional Notes Patient identified.  Risk benefits discussed including failed block, incomplete pain control, headache, nerve damage, paralysis, blood pressure changes, nausea, vomiting, reactions to medication both toxic or allergic, and postpartum back pain.  Patient expressed understanding and wished to proceed.  All questions were answered.  Sterile technique used throughout procedure.  CSF was clear.  No parasthesia or other complications.  Please see nursing notes for vital signs.

## 2012-09-24 NOTE — Anesthesia Postprocedure Evaluation (Signed)
Anesthesia Post Note  Patient: Anita Harrell  Procedure(s) Performed: Procedure(s) (LRB): HYSTERECTOMY ABDOMINAL (N/A) SALPINGO OOPHORECTOMY (Bilateral) FULGURATION VAGINAL WART (N/A)  Anesthesia type: General  Patient location: [facility] Unit  Post pain: Pain level controlled  Post assessment: Post-op Vital signs reviewed  Last Vitals:  Filed Vitals:   09/24/12 1546  BP: 128/81  Pulse: 98  Temp: 36.5 C  Resp: 20    Post vital signs: Reviewed  Level of consciousness: sedated  Complications: No apparent anesthesia complications

## 2012-09-24 NOTE — Anesthesia Preprocedure Evaluation (Addendum)
Anesthesia Evaluation  Patient identified by MRN, date of birth, ID band Patient awake    Reviewed: Allergy & Precautions, H&P , Patient's Chart, lab work & pertinent test results, reviewed documented beta blocker date and time   History of Anesthesia Complications Negative for: history of anesthetic complications  Airway Mallampati: II TM Distance: >3 FB Neck ROM: full    Dental no notable dental hx.    Pulmonary neg pulmonary ROS,  breath sounds clear to auscultation  Pulmonary exam normal       Cardiovascular Exercise Tolerance: Good hypertension, negative cardio ROS  Rhythm:regular Rate:Normal     Neuro/Psych PSYCHIATRIC DISORDERS Anxiety Depression negative neurological ROS  negative psych ROS   GI/Hepatic negative GI ROS, Neg liver ROS,   Endo/Other  negative endocrine ROS  Renal/GU negative Renal ROS     Musculoskeletal   Abdominal   Peds  Hematology negative hematology ROS (+)   Anesthesia Other Findings Chest pain     Epigastric pain        Hypertension     Hyperglycemia   mild    Allergy   allergic rhinitis Back pain        Goiter     History of tobacco abuse        Panic disorder   History of Insomnia   Hx of    Hyperlipidemia     Depression        Anxiety     Labile blood pressure        SVD (spontaneous vaginal delivery)   x 2        Reproductive/Obstetrics negative OB ROS                           Anesthesia Physical Anesthesia Plan  ASA: II  Anesthesia Plan: Spinal   Post-op Pain Management:    Induction:   Airway Management Planned:   Additional Equipment:   Intra-op Plan:   Post-operative Plan:   Informed Consent: I have reviewed the patients History and Physical, chart, labs and discussed the procedure including the risks, benefits and alternatives for the proposed anesthesia with the patient or authorized representative who has indicated his/her  understanding and acceptance.   Dental Advisory Given  Plan Discussed with: CRNA and Surgeon  Anesthesia Plan Comments:        Anesthesia Quick Evaluation

## 2012-09-24 NOTE — Transfer of Care (Signed)
Immediate Anesthesia Transfer of Care Note  Patient: Anita Harrell  Procedure(s) Performed: Procedure(s): HYSTERECTOMY ABDOMINAL (N/A) SALPINGO OOPHORECTOMY (Bilateral) FULGURATION VAGINAL WART (N/A)  Patient Location: PACU  Anesthesia Type:Spinal  Level of Consciousness: sedated  Airway & Oxygen Therapy: Patient Spontanous Breathing and Patient connected to nasal cannula oxygen  Post-op Assessment: Report given to PACU RN  Post vital signs: Reviewed and stable  Complications: No apparent anesthesia complications

## 2012-09-25 ENCOUNTER — Encounter (HOSPITAL_COMMUNITY): Payer: Self-pay | Admitting: Obstetrics and Gynecology

## 2012-09-25 LAB — CBC
HCT: 32.1 % — ABNORMAL LOW (ref 36.0–46.0)
Hemoglobin: 10.9 g/dL — ABNORMAL LOW (ref 12.0–15.0)
MCV: 85.6 fL (ref 78.0–100.0)
WBC: 22.8 10*3/uL — ABNORMAL HIGH (ref 4.0–10.5)

## 2012-09-25 MED ORDER — KETOROLAC TROMETHAMINE 30 MG/ML IJ SOLN
30.0000 mg | Freq: Four times a day (QID) | INTRAMUSCULAR | Status: DC | PRN
  Administered 2012-09-25 (×2): 30 mg via INTRAVENOUS
  Filled 2012-09-25: qty 1

## 2012-09-25 MED FILL — Heparin Sodium (Porcine) Inj 5000 Unit/ML: INTRAMUSCULAR | Qty: 1 | Status: AC

## 2012-09-25 NOTE — Op Note (Signed)
Anita Harrell, HIRTH NO.:  [id number]  MEDICAL RECORD NO.:  [id number]  LOCATION:  9318                          FACILITY:  [facility]  PHYSICIAN:  Miguel Aschoff, M.D.       DATE OF BIRTH:  Dec 05, 1950  DATE OF PROCEDURE:  09/24/2012 DATE OF DISCHARGE:                              OPERATIVE REPORT   PREOPERATIVE DIAGNOSIS:  Left adnexal cystic mass, perineal, and vulvar condylomata acuminata.  PROCEDURES: 1. Total abdominal hysterectomy. 2. Bilateral salpingo-oophorectomy followed by fulguration of vulva     and vaginal condylomata.  SURGEON:  Miguel Aschoff, MD  ANESTHESIA:  Spinal.  COMPLICATIONS:  None.  JUSTIFICATION:  The patient is a 62 year old white female who on examination was noted to have a left adnexal cystic mass, approximately 5 cm in size.  CA-125 level had been obtained and was within normal limits.  The mass was observed, however, did not change over time and because of its persistence and suspicion for it being an ovarian neoplasm such as cystadenoma, she is being brought to the operating room at this time to undergo hysterectomy and bilateral salpingo- oophorectomy.  In addition, she was noted to have a vaginal wart at the fourchette and an area of warts on the right labia which she wanted to treat at this time.  Informed consent has been obtained for both procedures.  DESCRIPTION OF PROCEDURE:  The patient was taken to the operating room. The patient was placed in a sitting position and spinal anesthesia was achieved without difficulty.  After satisfactory level of anesthesia was achieved and after she had been prepped and draped in usual sterile fashion, the Foley catheter inserted.  A Pfannenstiel incision was made, extending down through the subcutaneous tissue with bleeding points being clamped and coagulated as they were encountered.  The fascia was then identified and incised transversely and separated from the underlying rectus muscles.   The rectus muscles were divided in the midline.  The peritoneum was then found and entered carefully underlying structures.  There was no abnormal fluid noted within the abdominal cavity.  There was no evidence of any lesions on the omentum. Exploration of the abdomen was essentially negative except for the 5 to 6 cm smooth left ovarian mass adherent to the left pelvic sidewall.  The right tube and ovary appeared to be within normal limits.  The uterus was within normal limits.  The only remarkable finding in the cul-de-sac was that the rectum was advanced somewhat onto the posterior cervix.  At this point, the round ligaments were identified, suture ligated, and then divided.  The bladder flap was then created anteriorly.  The broad ligament was skeletonized and then perforations were made below the utero-ovarian ligaments.  The infundibulopelvic ligaments were identified and with the ureter being out of field, the infundibulopelvic ligaments were clamped, cut, and doubly ligated using ligatures of 0 Vicryl.  It was possible to tease the left ovary off the lateral pelvic sidewall and elevated, so the vessels to the ovary could be ligated without difficulty.  Once this was done, again the broad ligament was further skeletonized.  The uterine vessels found, clamped with curved Heaney clamps.  These  pedicles were cut and suture ligated using suture ligatures of 0 Vicryl.  Then, using straight Heaney clamps, the paracervical fascia was clamped, cut, and suture ligated.  This continued down to the level of the cardinal ligaments.  It was possible to carefully take the rectosigmoid which was somewhat elevated on to the posterior cervix down without injury to the structure.  Once this was done, it was possible to clamp the uterosacral ligaments with curved Heaney clamps.  These pedicles were cut and suture ligated using suture ligatures of 0 Vicryl.  Once this was done, the vaginal vault  was entered anteriorly and then the cervix was cut free from the vaginal fornices.  This freed the specimen, consisting of the cervix, uterus, tubes, and ovaries.  The angles of the cuff were then ligated using figure-of-eight sutures of 0 Vicryl.  Then, the cuff was closed using running interlocking 0 Vicryl suture.  Inspection was made for hemostasis.  There was small amount of oozing coming from the site where the ovary had been adherent to the sidewall.  This responded to pressure to ensure adequate hemostasis with the Surgicel.  A small pack was placed in this area.  At this point, with good hemostasis, lap counts and instrument counts were taken and found to be correct.  Then, we elected to close the abdomen.  The parietal peritoneum was closed using running interlocking 0 Vicryl suture.  Rectus muscles were reapproximated using running continuous 0 Vicryl suture.  The fascia was closed using 2 sutures of 0 Vicryl, each starting at the lateral vaginal angles and meeting in the midline.  The subcutaneous tissue was closed using interrupted 0 plain gut and then skin incision was closed using subcuticular 4-0 Vicryl.  Pressure dressing was applied.  Once this was done, the patient was placed into frogleg position.  The areas of the condylomata at the fourchette and right labia were injected with 1% Xylocaine, and then using a ball electrode with electrocautery, it was possible to ablate the warts without difficulty.  Silvadene cream was then applied to this area and at this point, the procedure was completed.  The patient was put in the supine position, taken to the recovery room in satisfactory condition.  The estimated blood loss from the procedure was approximately 150 mL.  The patient tolerated the procedure well and is to be admitted for an estimated 2 nights stay.     Miguel Aschoff, M.D.     AR/MEDQ  D:  09/24/2012  T:  09/25/2012  Job:  [id number]

## 2012-09-25 NOTE — Progress Notes (Signed)
S: Some nausea this AM. Still using PCA  O: Afebrile  BP 118/65   Pulse 105  Abdomen is soft dressing is dry.  Lab: HGB 10.9  Pre op 12  WBC 22.8  Impresson: Stable post op.  Plan: Ambulate,            Repeat CBC in  AM 09/25/2012           Increase diet            Add Toradol

## 2012-09-26 LAB — CBC
HCT: 31 % — ABNORMAL LOW (ref 36.0–46.0)
Platelets: 160 10*3/uL (ref 150–400)
RDW: 13.1 % (ref 11.5–15.5)
WBC: 21.5 10*3/uL — ABNORMAL HIGH (ref 4.0–10.5)

## 2012-09-26 NOTE — Progress Notes (Signed)
Pt d/c home  Out in wheelchair   Teaching complete

## 2012-09-26 NOTE — Progress Notes (Signed)
S: Doing well this PM wants to go home. Ambulating well and taking PO pain meds.   Afebrile  97.6 at 4:00 PM  Abdomen soft. Wound healing well  Impression: Satisfactory post op course.  Plan: D/C home           RTC 4 weeks           Meds: Percocet 5/325           Resume other meds that were taken pre op            Condition improved  Benign Cystadenofibromas

## 2012-09-26 NOTE — Progress Notes (Signed)
S: Feeling better this AM reports passing gas but no BM yet. Has been ambulating well.  Afebrile  Pulse 109    BP 138/89  Abdomen is soft and wound is healing well  Lab HG 10.1  WBC  21.5   Path: Bilateral cystadenofibromas, adenomyosis, fibroids.  A; Stable post op and doing well. Not clear why elevated WBC  Plan; Observe today and reassess this PM. If afebrile may be able to D/C home this PM or in AM 09/27/2012

## 2012-09-30 NOTE — Discharge Summary (Signed)
NAMEGENTRY, SEEBER NO.:  [id number]  MEDICAL RECORD NO.:  [id number]  LOCATION:  9318                          FACILITY:  [facility]  PHYSICIAN:  Miguel Aschoff, M.D.       DATE OF BIRTH:  [birth year]  DATE OF ADMISSION:  09/24/2012 DATE OF DISCHARGE:  09/26/2012                              DISCHARGE SUMMARY   ADMISSION DIAGNOSIS:  Left adnexal mass.  OPERATIONS AND PROCEDURES:  Total abdominal hysterectomy, bilateral salpingo-oophorectomy.  BRIEF HISTORY:  The patient is a 62 year old, white female, who on examination in the office was noted to have a 5-cm cystic left adnexal mass with a 1-cm solid area within it.  CA-125 levels were obtained on this patient and within normal limits.  Then, it was initially opted to observe this mass conservatively.  However, because of persistence of this mass and its probability that this represented an ovarian neoplasm. It was elected to proceed with total abdominal hysterectomy and bilateral salpingo-oophorectomy.  In addition, the patient had a history of cervical intraepithelial neoplasia low-grade for which she has been colposcoped and biopsied, and in addition, she has had an issue with condylomata at the fourchette and on her right labia.  She has requested that these lesions be treated at the time of surgery.  HOSPITAL COURSE:  The patient was taken to the operating room after preoperative studies were obtained.  This included a chemistry profile on July 8 which was essentially within normal limits.  PT and PTT were within normal limits.  Urinalysis was essentially negative.  HOSPITAL COURSE:  After a satisfactory level of spinal anesthesia was achieved, the patient underwent a total abdominal hysterectomy and bilateral salpingo-oophorectomy.  At the time of surgery, she was noted to have a 5-6 cm mass involving the right ovary which was somewhat adherent to the left lateral pelvic sidewall.  In addition, there was  a small mass noted in the right ovary.  The hysterectomy was carried out without difficulty.  Once the bilateral salpingo-oophorectomy, pelvic washings were obtained at the time of surgery.  Following the hysterectomy, the lesions involving the fourchette and right labia were treated with cautery and the condylomata were resolved without difficulty.  The patient's postoperative course was essentially uncomplicated.  She tolerated increasing ambulation and diet well.  Her hemoglobin remained stable.  She remained afebrile, and by the 2nd postoperative day, was felt to be in satisfactory condition and able to be discharged home.  She was sent home on Percocet 1 every 2-4 hours as needed for pain.  She was instructed to resume all prior medications that she was taking prior to admission to the hospital.  The pathology on the hysterectomy specimen and uterus and cervix revealed low-grade squamous intraepithelial neoplasia which had been completely excised. The endometrium showed a benign polyp.  There was adenomyosis and leiomyomata present.  Endometriosis was found on the uterine serosa. The right ovary revealed the presence of a benign serous cystadenofibroma and a left ovary similarly showed a benign serous cystadenofibroma and hemorrhagic cyst.  There was no evidence of malignancy.  The patient will be seen back in 4 weeks for followup examination.  She is to call  for any problems such as fever, pain, or heavy bleeding.  She was instructed no heavy lifting, place nothing in the vagina.  She was sent home on a regular diet in satisfactory condition.     Miguel Aschoff, M.D.     AR/MEDQ  D:  09/29/2012  T:  09/30/2012  Job:  [id number]

## 2012-11-26 ENCOUNTER — Encounter: Payer: Self-pay | Admitting: Family Medicine

## 2012-11-26 ENCOUNTER — Ambulatory Visit (INDEPENDENT_AMBULATORY_CARE_PROVIDER_SITE_OTHER): Payer: 59 | Admitting: Family Medicine

## 2012-11-26 VITALS — BP 126/82 | HR 115 | Temp 98.4°F | Ht 64.5 in | Wt 184.2 lb

## 2012-11-26 DIAGNOSIS — F4321 Adjustment disorder with depressed mood: Secondary | ICD-10-CM

## 2012-11-26 DIAGNOSIS — Z23 Encounter for immunization: Secondary | ICD-10-CM

## 2012-11-26 MED ORDER — BUPROPION HCL ER (XL) 150 MG PO TB24: 150.0000 mg | ORAL_TABLET | Freq: Every day | ORAL | Status: DC

## 2012-11-26 MED ORDER — ZOLPIDEM TARTRATE ER 12.5 MG PO TBCR: 12.5000 mg | EXTENDED_RELEASE_TABLET | Freq: Every evening | ORAL | Status: DC | PRN

## 2012-11-26 NOTE — Progress Notes (Signed)
Subjective:    Patient ID: Anita Harrell, female    DOB: [birth year], 62 y.o.   MRN: [id number]  HPI Here in grief over loss of her sister  It has been 30 days  Had congestive heart failure (from chemo years before) May have had blockages also  Went into cardiac arrest  It was a real shock    Lost her other sister in the past also  The 3 of them were all very close  They were her best friends and now she feels very alone  Lost both sisters very young     Has waves of tearfulness and she cannot control it  A lot of pain and sadness - she feels very depressed  Wants to know if med can be adjusted  She is not getting any grief counseling   She has lost 6 people in the past 3 years   She is taking care of herself fairly  Less interest but doing it  Her husband is very very supportive   Appetite is ok - husband is cooking for her  She is sleeping fairly with [place]   Patient Active Problem List   Diagnosis Date Noted  . Microscopic hematuria 08/13/2012  . Routine general medical examination at a health care facility 07/22/2012  . LEUKOCYTOSIS 03/30/2010  . HYPERGLYCEMIA, MILD 02/21/2008  . HYPERTENSION, BENIGN ESSENTIAL 01/22/2008  . ALLERGIC RHINITIS 07/25/2007  . BACK PAIN 06/26/2007  . Personal history of goiter 07/27/2006  . HYPERLIPIDEMIA 07/27/2006  . ANXIETY 07/27/2006  . PANIC DISORDER 07/27/2006  . Former smoker 07/27/2006  . DEPRESSION 07/27/2006  . INSOMNIA, HX OF 07/27/2006   Past Medical History  Diagnosis Date  . Chest pain   . Epigastric pain   . Hypertension   . Hyperglycemia     mild  . Allergy     allergic rhinitis  . Back pain   . Goiter   . History of tobacco abuse   . Panic disorder     History of  . Insomnia     Hx of  . Hyperlipidemia   . Depression   . Anxiety   . Labile blood pressure   . SVD (spontaneous vaginal delivery)     x 2   Past Surgical History  Procedure Laterality Date  . Elbow surgery  2004  . Thyroid  surgery      B9 massess  . Wisdom tooth extraction    . Eye surgery      bilateral lasik  . Colonoscopy    . Abdominal hysterectomy N/A 09/24/2012    Procedure: HYSTERECTOMY ABDOMINAL;  Surgeon: Miguel Aschoff, MD;  Location: [facility];  Service: Gynecology;  Laterality: N/A;  . Salpingoophorectomy Bilateral 09/24/2012    Procedure: SALPINGO OOPHORECTOMY;  Surgeon: Miguel Aschoff, MD;  Location: [facility];  Service: Gynecology;  Laterality: Bilateral;  . Wart fulguration N/A 09/24/2012    Procedure: FULGURATION VAGINAL WART;  Surgeon: Miguel Aschoff, MD;  Location: [facility];  Service: Gynecology;  Laterality: N/A;   History  Substance Use Topics  . Smoking status: Former Smoker -- 1.00 packs/day for 20 years    Types: Cigarettes    Quit date: 12/11/2008  . Smokeless tobacco: Never Used  . Alcohol Use: No   Family History  Problem Relation Age of Onset  . Hypertension Mother   . Stroke Mother   . Alcohol abuse Father   . Cancer Father     lung CA  . Heart disease Father  CHF  . Cancer Sister     breast CA  . Heart disease Sister     CHF from chemotx also has defib   Allergies  Allergen Reactions  . Codeine Nausea And Vomiting    REACTION: Nausea and vomiting Pt has tolerated vicodin & percocet in the past   Current Outpatient Prescriptions on File Prior to Visit  Medication Sig Dispense Refill  . ALPRAZolam (XANAX) 0.5 MG tablet Take 0.25 mg by mouth daily as needed for anxiety.       Marland Kitchen aspirin 81 MG tablet Take 81 mg by mouth daily.      Marland Kitchen lisinopril-hydrochlorothiazide (PRINZIDE,ZESTORETIC) 10-12.5 MG per tablet Take 1 tablet by mouth daily.  30 tablet  11  . mometasone (NASONEX) 50 MCG/ACT nasal spray Place 2 sprays into the nose daily as needed (allergies).      . Multiple Vitamin (MULTIVITAMIN) tablet Take 1 tablet by mouth daily.       . Omega-3 Fatty Acids (FISH OIL PO) Take 1 capsule by mouth daily.      Marland Kitchen PARoxetine (PAXIL) 40 MG tablet Take 1 tablet (40 mg total) by mouth daily.   30 tablet  11  . rosuvastatin (CRESTOR) 10 MG tablet Take 1 tablet (10 mg total) by mouth daily.  30 tablet  11  . zolpidem (AMBIEN CR) 12.5 MG CR tablet Take 1 tablet (12.5 mg total) by mouth at bedtime as needed for sleep.  30 tablet  3   No current facility-administered medications on file prior to visit.     Review of Systems Review of Systems  Constitutional: Negative for fever, appetite change, fatigue and unexpected weight change.  Eyes: Negative for pain and visual disturbance.  Respiratory: Negative for cough and shortness of breath.   Cardiovascular: Negative for cp or palpitations    Gastrointestinal: Negative for nausea, diarrhea and constipation.  Genitourinary: Negative for urgency and frequency.  Skin: Negative for pallor or rash   Neurological: Negative for weakness, light-headedness, numbness and headaches.  Hematological: Negative for adenopathy. Does not bruise/bleed easily.  Psychiatric/Behavioral: pos for dysphoric mood/ sadness, pos for anxiety that is fairly well controlled, neg for SI        Objective:   Physical Exam  Constitutional: She appears well-developed and well-nourished. No distress.  overwt and well app  HENT:  Head: Normocephalic and atraumatic.  Eyes: Conjunctivae and EOM are normal. Pupils are equal, round, and reactive to light.  Neck: Normal range of motion. Neck supple. No thyromegaly present.  Cardiovascular: Normal rate and regular rhythm.   Rate in 80s at rest  Pulmonary/Chest: Breath sounds normal.  Lymphadenopathy:    She has no cervical adenopathy.  Neurological: She is alert. She has normal reflexes.  Skin: Skin is warm and dry. No rash noted. No pallor.  Psychiatric: Her speech is normal. Her affect is labile. Thought content is not paranoid. Cognition and memory are normal. She exhibits a depressed mood. She expresses no homicidal and no suicidal ideation.  Very sad and tearful today  Mentally sharp and clear She is attentive.           Assessment & Plan:

## 2012-11-26 NOTE — Assessment & Plan Note (Signed)
1 mo after unexpected loss of her sister (and many losses this year) On ssri Will add wellbutrin Long disc about coping skills/ support and expectations for grief  Will ref to counselor as well  >25 min spent with face to face with patient, >50% counseling and/or coordinating care  F/u planned  Will update if any side eff or worsening of sympt

## 2012-11-26 NOTE — Patient Instructions (Addendum)
We will do a counseling referral at check out  Keep taking care of yourself  Start wellbutrin 150 mg xl once daily in am Continue your other medicines  If you are interested in a shingles/zoster vaccine - call your insurance to check on coverage,( you should not get it within 1 month of other vaccines) , then call us for a prescription  for it to take to a pharmacy that gives the shot , or make a nurse visit to get it here depending on your coverage If you feel worse -let me know  Follow up with me in about 6-8 weeks

## 2013-01-01 ENCOUNTER — Ambulatory Visit (INDEPENDENT_AMBULATORY_CARE_PROVIDER_SITE_OTHER): Payer: 59 | Admitting: Psychology

## 2013-01-01 DIAGNOSIS — F4321 Adjustment disorder with depressed mood: Secondary | ICD-10-CM

## 2013-01-07 ENCOUNTER — Ambulatory Visit (INDEPENDENT_AMBULATORY_CARE_PROVIDER_SITE_OTHER): Payer: 59 | Admitting: Family Medicine

## 2013-01-07 ENCOUNTER — Encounter: Payer: Self-pay | Admitting: Family Medicine

## 2013-01-07 ENCOUNTER — Other Ambulatory Visit: Payer: Self-pay | Admitting: *Deleted

## 2013-01-07 VITALS — BP 142/86 | HR 113 | Temp 98.1°F | Ht 64.5 in | Wt 190.8 lb

## 2013-01-07 DIAGNOSIS — Z23 Encounter for immunization: Secondary | ICD-10-CM

## 2013-01-07 DIAGNOSIS — Z2911 Encounter for prophylactic immunotherapy for respiratory syncytial virus (RSV): Secondary | ICD-10-CM

## 2013-01-07 DIAGNOSIS — F4321 Adjustment disorder with depressed mood: Secondary | ICD-10-CM

## 2013-01-07 MED ORDER — ALPRAZOLAM 0.5 MG PO TABS: 0.2500 mg | ORAL_TABLET | Freq: Every day | ORAL | Status: DC | PRN

## 2013-01-07 MED ORDER — BUPROPION HCL ER (XL) 300 MG PO TB24: 300.0000 mg | ORAL_TABLET | Freq: Every day | ORAL | Status: DC

## 2013-01-07 NOTE — Patient Instructions (Addendum)
Shingles vaccine today  Increase your wellbutrin xl to 300 mg once daily  Keep going to counseling at hospice  Get out and socialize a bit Try to be active also  Schedule PE with labs prior after May 12

## 2013-01-07 NOTE — Telephone Encounter (Signed)
Px written for call in   

## 2013-01-07 NOTE — Telephone Encounter (Signed)
Pt forgot to ask you when she was here for her appt to refill her xanax, pt is only using prn but needs new Rx, please advise

## 2013-01-07 NOTE — Progress Notes (Signed)
Subjective:    Patient ID: Anita Harrell, female    DOB: 1950/04/01, 62 y.o.   MRN: [id number]  HPI Last visit added wellbutrin for grief  Still a whole lot of grief and depressive symptoms -perhaps slt improved (less tearful, can hold it together for longer peroids of time) Thinks she needs to inc the wellbutrin  She keeps reliving the situation with death of sister over and over and over  Feels numb at times and panic at another  She has lost wt - is not exercising - just not motivated   No side effects   Is seeing a hospice counselor- and will go into a group session  Also came here for a while for counseling also  husb is a good support also    Wants to get her zoster vaccine today  Patient Active Problem List   Diagnosis Date Noted  . Grief reaction 11/26/2012  . Microscopic hematuria 08/13/2012  . Routine general medical examination at a health care facility 07/22/2012  . LEUKOCYTOSIS 03/30/2010  . HYPERGLYCEMIA, MILD 02/21/2008  . HYPERTENSION, BENIGN ESSENTIAL 01/22/2008  . ALLERGIC RHINITIS 07/25/2007  . BACK PAIN 06/26/2007  . Personal history of goiter 07/27/2006  . HYPERLIPIDEMIA 07/27/2006  . ANXIETY 07/27/2006  . PANIC DISORDER 07/27/2006  . Former smoker 07/27/2006  . DEPRESSION 07/27/2006  . INSOMNIA, HX OF 07/27/2006   Past Medical History  Diagnosis Date  . Chest pain   . Epigastric pain   . Hypertension   . Hyperglycemia     mild  . Allergy     allergic rhinitis  . Back pain   . Goiter   . History of tobacco abuse   . Panic disorder     History of  . Insomnia     Hx of  . Hyperlipidemia   . Depression   . Anxiety   . Labile blood pressure   . SVD (spontaneous vaginal delivery)     x 2   Past Surgical History  Procedure Laterality Date  . Elbow surgery  2004  . Thyroid surgery      B9 massess  . Wisdom tooth extraction    . Eye surgery      bilateral lasik  . Colonoscopy    . Abdominal hysterectomy N/A 09/24/2012   Procedure: HYSTERECTOMY ABDOMINAL;  Surgeon: Miguel Aschoff, MD;  Location: [facility];  Service: Gynecology;  Laterality: N/A;  . Salpingoophorectomy Bilateral 09/24/2012    Procedure: SALPINGO OOPHORECTOMY;  Surgeon: Miguel Aschoff, MD;  Location: [facility];  Service: Gynecology;  Laterality: Bilateral;  . Wart fulguration N/A 09/24/2012    Procedure: FULGURATION VAGINAL WART;  Surgeon: Miguel Aschoff, MD;  Location: [facility];  Service: Gynecology;  Laterality: N/A;   History  Substance Use Topics  . Smoking status: Former Smoker -- 1.00 packs/day for 20 years    Types: Cigarettes    Quit date: 12/11/2008  . Smokeless tobacco: Never Used  . Alcohol Use: No   Family History  Problem Relation Age of Onset  . Hypertension Mother   . Stroke Mother   . Alcohol abuse Father   . Cancer Father     lung CA  . Heart disease Father     CHF  . Cancer Sister     breast CA  . Heart disease Sister     CHF from chemotx also has defib   Allergies  Allergen Reactions  . Codeine Nausea And Vomiting    REACTION: Nausea and vomiting Pt  has tolerated vicodin & percocet in the past   Current Outpatient Prescriptions on File Prior to Visit  Medication Sig Dispense Refill  . ALPRAZolam (XANAX) 0.5 MG tablet Take 0.25 mg by mouth daily as needed for anxiety.       Marland Kitchen aspirin 81 MG tablet Take 81 mg by mouth daily.      Marland Kitchen buPROPion (WELLBUTRIN XL) 150 MG 24 hr tablet Take 1 tablet (150 mg total) by mouth daily.  30 tablet  11  . lisinopril-hydrochlorothiazide (PRINZIDE,ZESTORETIC) 10-12.5 MG per tablet Take 1 tablet by mouth daily.  30 tablet  11  . mometasone (NASONEX) 50 MCG/ACT nasal spray Place 2 sprays into the nose daily as needed (allergies).      . Multiple Vitamin (MULTIVITAMIN) tablet Take 1 tablet by mouth daily.       . Omega-3 Fatty Acids (FISH OIL PO) Take 1 capsule by mouth daily.      Marland Kitchen PARoxetine (PAXIL) 40 MG tablet Take 1 tablet (40 mg total) by mouth daily.  30 tablet  11  . rosuvastatin (CRESTOR) 10  MG tablet Take 1 tablet (10 mg total) by mouth daily.  30 tablet  11  . zolpidem (AMBIEN CR) 12.5 MG CR tablet Take 1 tablet (12.5 mg total) by mouth at bedtime as needed for sleep.  30 tablet  3   No current facility-administered medications on file prior to visit.     Review of Systems    Review of Systems  Constitutional: Negative for fever, appetite change, fatigue and unexpected weight change.  Eyes: Negative for pain and visual disturbance.  Respiratory: Negative for cough and shortness of breath.   Cardiovascular: Negative for cp or palpitations    Gastrointestinal: Negative for nausea, diarrhea and constipation.  Genitourinary: Negative for urgency and frequency.  Skin: Negative for pallor or rash   Neurological: Negative for weakness, light-headedness, numbness and headaches.  Hematological: Negative for adenopathy. Does not bruise/bleed easily.  Psychiatric/Behavioral:pos for dysphoric mood/ anx and grief , neg for SI     Objective:   Physical Exam  Constitutional: She appears well-developed and well-nourished. No distress.  obese and well appearing   HENT:  Head: Normocephalic and atraumatic.  Eyes: Conjunctivae and EOM are normal. Pupils are equal, round, and reactive to light.  Neck: Normal range of motion. Neck supple. No thyromegaly present.  Cardiovascular: Normal rate and regular rhythm.  Exam reveals no gallop.   Rate of 90 at rest  Pulmonary/Chest: Effort normal and breath sounds normal.  Lymphadenopathy:    She has no cervical adenopathy.  Neurological: She is alert. She has normal reflexes. She displays no tremor. No cranial nerve deficit. She exhibits normal muscle tone. Coordination normal.  Skin: Skin is warm and dry. No erythema. No pallor.  Psychiatric: Her speech is normal and behavior is normal. Thought content normal. Her mood appears anxious. Her affect is not labile and not inappropriate. Thought content is not paranoid. Cognition and memory are  normal. She exhibits a depressed mood. She expresses no homicidal ideation.  Tearful at times but improved Able to concentrate-more attentive Good insight  Candid when disc her grief           Assessment & Plan:

## 2013-01-07 NOTE — Telephone Encounter (Signed)
Rx called in as prescribed and left voicemail letting pt know Rx was sent

## 2013-01-08 NOTE — Assessment & Plan Note (Signed)
Slight imp with addn of wellbutrin (in tearfulness) Will inc to 300 xl Will continue counseling through hospice Urged socialization-will do group tx also Disc self care  Disc poss side eff-she will update if problems  F/u planned

## 2013-01-15 ENCOUNTER — Ambulatory Visit: Payer: 59 | Admitting: Psychology

## 2013-02-21 ENCOUNTER — Encounter: Payer: Self-pay | Admitting: Internal Medicine

## 2013-02-21 ENCOUNTER — Ambulatory Visit (INDEPENDENT_AMBULATORY_CARE_PROVIDER_SITE_OTHER): Payer: 59 | Admitting: Internal Medicine

## 2013-02-21 VITALS — BP 126/68 | HR 112 | Temp 97.7°F | Wt 194.0 lb

## 2013-02-21 DIAGNOSIS — R52 Pain, unspecified: Secondary | ICD-10-CM

## 2013-02-21 MED ORDER — CYCLOBENZAPRINE HCL 10 MG PO TABS: 5.0000 mg | ORAL_TABLET | Freq: Every evening | ORAL | Status: DC | PRN

## 2013-02-21 NOTE — Patient Instructions (Signed)
Please continue to use heat and ice as tolerated Ibuprofen 800mg  three times a day Use the cyclobenzaprine at bedtime to help sleep When the pain is gone, a general fitness program including resistance weight training may make you less susceptible to this type of injury

## 2013-02-21 NOTE — Assessment & Plan Note (Signed)
Neck, arm and chest wall Distribution and history do not suggest radicular source Nothing to suggest lung infection or intrathoracic injury Seems most likely to be soft tissue  Discussed heat/ice Ibuprofen Flexeril at bedtime Will take some time

## 2013-02-21 NOTE — Progress Notes (Signed)
Pre-visit discussion using our clinic review tool. No additional management support is needed unless otherwise documented below in the visit note.  

## 2013-02-21 NOTE — Progress Notes (Signed)
Subjective:    Patient ID: Anita Harrell, female    DOB: 1950/09/22, 62 y.o.   MRN: [id number]  HPI Here with husband  2 nights ago--awoke with pain along mid left back and along lateral breast were painful Also left neck and arm excrutiating pain with any movement----can't even cough Pain in breast if she leans over (and bra not on)  The day before, she rearranged cabinet (but light things) Will throw light wood on the fire--nothing different  Took 4 ibuprofen--seemed to help some Using this every 4 hours or so Has tried ice Needs husband to help her move around  Current Outpatient Prescriptions on File Prior to Visit  Medication Sig Dispense Refill  . ALPRAZolam (XANAX) 0.5 MG tablet Take 0.5 tablets (0.25 mg total) by mouth daily as needed for anxiety.  30 tablet  0  . aspirin 81 MG tablet Take 81 mg by mouth daily.      Marland Kitchen buPROPion (WELLBUTRIN XL) 300 MG 24 hr tablet Take 1 tablet (300 mg total) by mouth daily.  30 tablet  11  . lisinopril-hydrochlorothiazide (PRINZIDE,ZESTORETIC) 10-12.5 MG per tablet Take 1 tablet by mouth daily.  30 tablet  11  . mometasone (NASONEX) 50 MCG/ACT nasal spray Place 2 sprays into the nose daily as needed (allergies).      . Multiple Vitamin (MULTIVITAMIN) tablet Take 1 tablet by mouth daily.       . Omega-3 Fatty Acids (FISH OIL PO) Take 1 capsule by mouth daily.      Marland Kitchen PARoxetine (PAXIL) 40 MG tablet Take 1 tablet (40 mg total) by mouth daily.  30 tablet  11  . rosuvastatin (CRESTOR) 10 MG tablet Take 1 tablet (10 mg total) by mouth daily.  30 tablet  11  . zolpidem (AMBIEN CR) 12.5 MG CR tablet Take 1 tablet (12.5 mg total) by mouth at bedtime as needed for sleep.  30 tablet  3   No current facility-administered medications on file prior to visit.    Allergies  Allergen Reactions  . Codeine Nausea And Vomiting    REACTION: Nausea and vomiting Pt has tolerated vicodin & percocet in the past    Past Medical History  Diagnosis Date   . Chest pain   . Epigastric pain   . Hypertension   . Hyperglycemia     mild  . Allergy     allergic rhinitis  . Back pain   . Goiter   . History of tobacco abuse   . Panic disorder     History of  . Insomnia     Hx of  . Hyperlipidemia   . Depression   . Anxiety   . Labile blood pressure   . SVD (spontaneous vaginal delivery)     x 2    Past Surgical History  Procedure Laterality Date  . Elbow surgery  2004  . Thyroid surgery      B9 massess  . Wisdom tooth extraction    . Eye surgery      bilateral lasik  . Colonoscopy    . Abdominal hysterectomy N/A 09/24/2012    Procedure: HYSTERECTOMY ABDOMINAL;  Surgeon: Miguel Aschoff, MD;  Location: [facility];  Service: Gynecology;  Laterality: N/A;  . Salpingoophorectomy Bilateral 09/24/2012    Procedure: SALPINGO OOPHORECTOMY;  Surgeon: Miguel Aschoff, MD;  Location: [facility];  Service: Gynecology;  Laterality: Bilateral;  . Wart fulguration N/A 09/24/2012    Procedure: FULGURATION VAGINAL WART;  Surgeon: Miguel Aschoff, MD;  Location: [facility];  Service: Gynecology;  Laterality: N/A;    Family History  Problem Relation Age of Onset  . Hypertension Mother   . Stroke Mother   . Alcohol abuse Father   . Cancer Father     lung CA  . Heart disease Father     CHF  . Cancer Sister     breast CA  . Heart disease Sister     CHF from chemotx also has defib    History   Social History  . Marital Status: Married    Spouse Name: N/A    Number of Children: N/A  . Years of Education: N/A   Occupational History  . Not on file.   Social History Main Topics  . Smoking status: Former Smoker -- 1.00 packs/day for 20 years    Types: Cigarettes    Quit date: 12/11/2008  . Smokeless tobacco: Never Used  . Alcohol Use: No  . Drug Use: No  . Sexual Activity: Yes    Birth Control/ Protection: Post-menopausal   Other Topics Concern  . Not on file   Social History Narrative  . No narrative on file   Review of Systems No history of neck  injury No fever No cough No SOB--but can't take a deep breath     Objective:   Physical Exam  Constitutional: She appears well-developed and well-nourished. No distress.  Neck: Normal range of motion. Neck supple. No thyromegaly present.  No neck tenderness  Pulmonary/Chest: Effort normal and breath sounds normal. No respiratory distress. She has no wheezes. She has no rales. She exhibits tenderness.  Some tenderness lateral to mid left scapula   Musculoskeletal:  Some pain with left shoulder abduction No trouble with ROM in elbow and wrist No synovitis  Lymphadenopathy:    She has no cervical adenopathy.  Neurological:  No arm weakness           Assessment & Plan:

## 2013-03-20 ENCOUNTER — Other Ambulatory Visit: Payer: Self-pay | Admitting: Internal Medicine

## 2013-03-20 NOTE — Telephone Encounter (Signed)
I refilled this times one - please  check in with her to see how her pain/ symptoms are --? Is she improving thanks

## 2013-03-21 NOTE — Telephone Encounter (Signed)
Pt said her pain is a lot better pt isn't having to take med every day now she only takes it prn, pt said the main time she still has pain is when she raises her arm but even that pain has improved, pt said if she doesn't feel 100% better after this refill she will call and schedule a f/u with you

## 2013-03-31 ENCOUNTER — Other Ambulatory Visit: Payer: Self-pay | Admitting: Family Medicine

## 2013-03-31 NOTE — Telephone Encounter (Signed)
Px written for call in   

## 2013-03-31 NOTE — Telephone Encounter (Signed)
Last office visit 02/21/2013 with Dr. Silvio Pate.  Ok to refill?

## 2013-03-31 NOTE — Telephone Encounter (Signed)
Medication phoned to pharmacy.  

## 2013-04-17 ENCOUNTER — Other Ambulatory Visit: Payer: Self-pay | Admitting: Family Medicine

## 2013-04-17 NOTE — Telephone Encounter (Signed)
Electronic refill request, please advise  

## 2013-04-17 NOTE — Telephone Encounter (Signed)
Refilled electronically 

## 2013-07-15 ENCOUNTER — Other Ambulatory Visit: Payer: Self-pay | Admitting: Family Medicine

## 2013-07-15 NOTE — Telephone Encounter (Signed)
Electronic refill request, please advise  

## 2013-07-15 NOTE — Telephone Encounter (Signed)
done

## 2013-07-15 NOTE — Telephone Encounter (Signed)
She has appt with me later this mo  Please refill 1 mo (or 3 mo if mail order)  thanks

## 2013-07-16 ENCOUNTER — Ambulatory Visit: Payer: Self-pay | Admitting: Family Medicine

## 2013-07-23 ENCOUNTER — Ambulatory Visit (INDEPENDENT_AMBULATORY_CARE_PROVIDER_SITE_OTHER): Payer: 59 | Admitting: Family Medicine

## 2013-07-23 ENCOUNTER — Telehealth: Payer: Self-pay | Admitting: Family Medicine

## 2013-07-23 ENCOUNTER — Encounter: Payer: Self-pay | Admitting: Family Medicine

## 2013-07-23 VITALS — BP 122/68 | HR 113 | Temp 98.4°F | Ht 64.0 in | Wt 189.8 lb

## 2013-07-23 DIAGNOSIS — E785 Hyperlipidemia, unspecified: Secondary | ICD-10-CM

## 2013-07-23 DIAGNOSIS — F411 Generalized anxiety disorder: Secondary | ICD-10-CM

## 2013-07-23 DIAGNOSIS — I10 Essential (primary) hypertension: Secondary | ICD-10-CM

## 2013-07-23 DIAGNOSIS — R7309 Other abnormal glucose: Secondary | ICD-10-CM

## 2013-07-23 DIAGNOSIS — Z0000 Encounter for general adult medical examination without abnormal findings: Secondary | ICD-10-CM

## 2013-07-23 DIAGNOSIS — M549 Dorsalgia, unspecified: Secondary | ICD-10-CM

## 2013-07-23 DIAGNOSIS — F410 Panic disorder [episodic paroxysmal anxiety] without agoraphobia: Secondary | ICD-10-CM

## 2013-07-23 LAB — CBC WITH DIFFERENTIAL/PLATELET
BASOS ABS: 0.1 10*3/uL (ref 0.0–0.1)
Basophils Relative: 0.6 % (ref 0.0–3.0)
EOS ABS: 0 10*3/uL (ref 0.0–0.7)
Eosinophils Relative: 0.5 % (ref 0.0–5.0)
HEMATOCRIT: 38.9 % (ref 36.0–46.0)
Hemoglobin: 13.1 g/dL (ref 12.0–15.0)
LYMPHS ABS: 1.9 10*3/uL (ref 0.7–4.0)
Lymphocytes Relative: 22.4 % (ref 12.0–46.0)
MCHC: 33.7 g/dL (ref 30.0–36.0)
MCV: 87.5 fl (ref 78.0–100.0)
MONO ABS: 1.7 10*3/uL — AB (ref 0.1–1.0)
Monocytes Relative: 20.7 % — ABNORMAL HIGH (ref 3.0–12.0)
Neutro Abs: 4.7 10*3/uL (ref 1.4–7.7)
Neutrophils Relative %: 55.8 % (ref 43.0–77.0)
PLATELETS: 200 10*3/uL (ref 150.0–400.0)
RBC: 4.45 Mil/uL (ref 3.87–5.11)
RDW: 13.1 % (ref 11.5–15.5)
WBC: 8.4 10*3/uL (ref 4.0–10.5)

## 2013-07-23 LAB — TSH: TSH: 1.83 u[IU]/mL (ref 0.35–4.50)

## 2013-07-23 LAB — LIPID PANEL
CHOL/HDL RATIO: 3
Cholesterol: 112 mg/dL (ref 0–200)
HDL: 44.8 mg/dL (ref >39.00)
LDL CALC: 51 mg/dL (ref 0–99)
TRIGLYCERIDES: 81 mg/dL (ref 0.0–149.0)
VLDL: 16.2 mg/dL (ref 0.0–40.0)

## 2013-07-23 LAB — COMPREHENSIVE METABOLIC PANEL
ALK PHOS: 53 U/L (ref 39–117)
ALT: 16 U/L (ref 0–35)
AST: 21 U/L (ref 0–37)
Albumin: 4.4 g/dL (ref 3.5–5.2)
BILIRUBIN TOTAL: 0.5 mg/dL (ref 0.2–1.2)
BUN: 15 mg/dL (ref 6–23)
CO2: 27 mEq/L (ref 19–32)
Calcium: 9.5 mg/dL (ref 8.4–10.5)
Chloride: 104 mEq/L (ref 96–112)
Creatinine, Ser: 0.7 mg/dL (ref 0.4–1.2)
GFR: 85.2 mL/min (ref >60.00)
Glucose, Bld: 109 mg/dL — ABNORMAL HIGH (ref 70–99)
Potassium: 3.7 mEq/L (ref 3.5–5.1)
SODIUM: 139 meq/L (ref 135–145)
TOTAL PROTEIN: 7.7 g/dL (ref 6.0–8.3)

## 2013-07-23 LAB — HEMOGLOBIN A1C: Hgb A1c MFr Bld: 6.2 % (ref 4.6–6.5)

## 2013-07-23 MED ORDER — CYCLOBENZAPRINE HCL 10 MG PO TABS: ORAL_TABLET | ORAL | Status: DC

## 2013-07-23 MED ORDER — PAROXETINE HCL 40 MG PO TABS: ORAL_TABLET | ORAL | Status: DC

## 2013-07-23 NOTE — Assessment & Plan Note (Signed)
Worsened by grief  Did counseling times 3  Still panic attacks daily - but not as depressed  Xanax prn-do not want this on a schedule if we can help it  Inc paxil to 60 mg daily If no imp -ref to psychiatry  Reviewed stressors/ coping techniques/symptoms/ support sources/ tx options and side effects in detail today

## 2013-07-23 NOTE — Assessment & Plan Note (Signed)
bp in fair control at this time  BP Readings from Last 1 Encounters:  07/23/13 122/68   No changes needed Disc lifstyle change with low sodium diet and exercise  Lab today

## 2013-07-23 NOTE — Progress Notes (Signed)
Subjective:    Patient ID: Anita Harrell, female    DOB: [birth year], 63 y.o.   MRN: [id number]  HPI Here for health maintenance exam and to review chronic medical problems    Trouble with arthritis in spine lumbar and also disc dz  Last week helped elderly family member  Came here and got a muscle relaxer  Then went to a chiropractor and it helped a lot - she is able to walk upright again -goes back today  Also given exercise to do   Wt is down 5 lb with bmi of 32 Wants to get exercising again as soon as she can  Needs a refill on her flexeril   Mood is ok  Stopped wellbutrin -had some weird thoughts on it --some hopelessness  Now mood is good  She still has one panic attack per day at least  She feels like she needs xanax once per day - still grief (sisters)  She has seen a counselor 3 times-no help  Is interested in going up on paxil to 60 mg    Flu vacc 9/14 colonosc 3/06- 10 year recall   Mammogram 5/14 nl - she goes to her gyn for that (had some repeat checks)  Self exam-no lumps  Had a hysterectomy and plans to see gyn for f/u in the next several weeks  She had a long recovery and now doing well  Pap 5/14 with CIN1 Pt had hysterect total 7/14  Also had vaginal wart tx   Td 11/13  Zoster vac 10/14   bp is stable today  No cp or palpitations or headaches or edema  No side effects to medicines  BP Readings from Last 3 Encounters:  07/23/13 122/68  02/21/13 126/68  01/07/13 142/86     Hyperlipidemia  Diet is good  Lab today     Patient Active Problem List   Diagnosis Date Noted  . Pain of left side of body 02/21/2013  . Grief reaction 11/26/2012  . Microscopic hematuria 08/13/2012  . Routine general medical examination at a health care facility 07/22/2012  . LEUKOCYTOSIS 03/30/2010  . HYPERGLYCEMIA, MILD 02/21/2008  . HYPERTENSION, BENIGN ESSENTIAL 01/22/2008  . ALLERGIC RHINITIS 07/25/2007  . BACK PAIN 06/26/2007  . Personal history of  goiter 07/27/2006  . HYPERLIPIDEMIA 07/27/2006  . ANXIETY 07/27/2006  . PANIC DISORDER 07/27/2006  . Former smoker 07/27/2006  . DEPRESSION 07/27/2006  . INSOMNIA, HX OF 07/27/2006   Past Medical History  Diagnosis Date  . Chest pain   . Epigastric pain   . Hypertension   . Hyperglycemia     mild  . Allergy     allergic rhinitis  . Back pain   . Goiter   . History of tobacco abuse   . Panic disorder     History of  . Insomnia     Hx of  . Hyperlipidemia   . Depression   . Anxiety   . Labile blood pressure   . SVD (spontaneous vaginal delivery)     x 2   Past Surgical History  Procedure Laterality Date  . Elbow surgery  2004  . Thyroid surgery      B9 massess  . Wisdom tooth extraction    . Eye surgery      bilateral lasik  . Colonoscopy    . Abdominal hysterectomy N/A 09/24/2012    Procedure: HYSTERECTOMY ABDOMINAL;  Surgeon: Gus Height, MD;  Location: [facility];  Service: Gynecology;  Laterality:  N/A;  . Salpingoophorectomy Bilateral 09/24/2012    Procedure: SALPINGO OOPHORECTOMY;  Surgeon: Gus Height, MD;  Location: [facility];  Service: Gynecology;  Laterality: Bilateral;  . Wart fulguration N/A 09/24/2012    Procedure: FULGURATION VAGINAL WART;  Surgeon: Gus Height, MD;  Location: [facility];  Service: Gynecology;  Laterality: N/A;   History  Substance Use Topics  . Smoking status: Former Smoker -- 1.00 packs/day for 20 years    Types: Cigarettes    Quit date: 12/11/2008  . Smokeless tobacco: Never Used  . Alcohol Use: No   Family History  Problem Relation Age of Onset  . Hypertension Mother   . Stroke Mother   . Alcohol abuse Father   . Cancer Father     lung CA  . Heart disease Father     CHF  . Cancer Sister     breast CA  . Heart disease Sister     CHF from chemotx also has defib   Allergies  Allergen Reactions  . Codeine Nausea And Vomiting    REACTION: Nausea and vomiting Pt has tolerated vicodin & percocet in the past  . Wellbutrin [Bupropion]      Current Outpatient Prescriptions on File Prior to Visit  Medication Sig Dispense Refill  . aspirin 81 MG tablet Take 81 mg by mouth daily.      . CRESTOR 10 MG tablet TAKE 1 TABLET (10 MG TOTAL) BY MOUTH DAILY.  30 tablet  0  . cyclobenzaprine (FLEXERIL) 10 MG tablet TAKE 1/2 TO 1 TABLET BY MOUTH AT BEDTIME AS NEEDED FOR MUSCLE SPASMS  30 tablet  3  . lisinopril-hydrochlorothiazide (PRINZIDE,ZESTORETIC) 10-12.5 MG per tablet TAKE 1 TABLET BY MOUTH DAILY.  30 tablet  0  . mometasone (NASONEX) 50 MCG/ACT nasal spray Place 2 sprays into the nose daily as needed (allergies).      . Multiple Vitamin (MULTIVITAMIN) tablet Take 1 tablet by mouth daily.       . Omega-3 Fatty Acids (FISH OIL PO) Take 1 capsule by mouth daily.      Marland Kitchen PARoxetine (PAXIL) 40 MG tablet TAKE 1 TABLET (40 MG TOTAL) BY MOUTH DAILY.  30 tablet  0  . zolpidem (AMBIEN CR) 12.5 MG CR tablet TAKE 1 TABLET BY MOUTH AT BEDTIME AS NEEDED FOR SLEEP  30 tablet  3  . ALPRAZolam (XANAX) 0.5 MG tablet Take 0.5 tablets (0.25 mg total) by mouth daily as needed for anxiety.  30 tablet  0   No current facility-administered medications on file prior to visit.    Review of Systems Review of Systems  Constitutional: Negative for fever, appetite change, fatigue and unexpected weight change.  Eyes: Negative for pain and visual disturbance.  Respiratory: Negative for cough and shortness of breath.   Cardiovascular: Negative for cp or palpitations    Gastrointestinal: Negative for nausea, diarrhea and constipation.  Genitourinary: Negative for urgency and frequency.  Skin: Negative for pallor or rash   MSK pos for back pain  Neurological: Negative for weakness, light-headedness, numbness and headaches.  Hematological: Negative for adenopathy. Does not bruise/bleed easily.  Psychiatric/Behavioral: pos for improved depression , pos for continued panic attacks and grief          Objective:   Physical Exam  Constitutional: She appears  well-developed and well-nourished. No distress.  obese and well appearing   HENT:  Head: Normocephalic and atraumatic.  Right Ear: External ear normal.  Left Ear: External ear normal.  Nose: Nose normal.  Mouth/Throat: Oropharynx is clear and moist.  Eyes: Conjunctivae and EOM are normal. Pupils are equal, round, and reactive to light. Right eye exhibits no discharge. Left eye exhibits no discharge. No scleral icterus.  Neck: Normal range of motion. Neck supple. No JVD present. No thyromegaly present.  Cardiovascular: Normal rate, regular rhythm, normal heart sounds and intact distal pulses.  Exam reveals no gallop.   Pulmonary/Chest: Effort normal and breath sounds normal. No respiratory distress. She has no wheezes. She has no rales.  Abdominal: Soft. Bowel sounds are normal. She exhibits no distension and no mass. There is no tenderness.  Musculoskeletal: She exhibits no edema and no tenderness.  Limited rom of LS without bony tenderness  No neuro changes   Lymphadenopathy:    She has no cervical adenopathy.  Neurological: She is alert. She has normal reflexes. No cranial nerve deficit. She exhibits normal muscle tone. Coordination normal.  Skin: Skin is warm and dry. No rash noted. No erythema. No pallor.  Psychiatric: Her speech is normal and behavior is normal. Thought content normal. Her mood appears anxious. Her affect is labile. Thought content is not paranoid. She exhibits a depressed mood. She expresses no homicidal and no suicidal ideation.  Tearful at times when discussing grief and her anxiety symptoms           Assessment & Plan:

## 2013-07-23 NOTE — Telephone Encounter (Signed)
Relevant patient education assigned to patient using Emmi. ° °

## 2013-07-23 NOTE — Progress Notes (Signed)
Pre visit review using our clinic review tool, if applicable. No additional management support is needed unless otherwise documented below in the visit note. 

## 2013-07-23 NOTE — Assessment & Plan Note (Signed)
Hx of OA and disc dz  Worse lately  Flexeril helps  Needs a refill  Chiropractor helps  May need to consid PT

## 2013-07-23 NOTE — Assessment & Plan Note (Signed)
Reviewed health habits including diet and exercise and skin cancer prevention Reviewed appropriate screening tests for age  Also reviewed health mt list, fam hx and immunization status , as well as social and family history   See HPI Labs today   

## 2013-07-24 NOTE — Assessment & Plan Note (Addendum)
Lipid panel today On crestor  Rev low sat fat diet

## 2013-07-24 NOTE — Assessment & Plan Note (Signed)
This has worsened since last visit  Will inc paxil to 60 mg  Would like to avoid regular dosing of xanax if possible She has done counseling

## 2013-07-24 NOTE — Assessment & Plan Note (Signed)
A1C today Rev low glycemic diet and need for exercise

## 2013-07-25 ENCOUNTER — Encounter: Payer: Self-pay | Admitting: *Deleted

## 2013-08-11 ENCOUNTER — Other Ambulatory Visit: Payer: Self-pay | Admitting: Family Medicine

## 2013-08-11 NOTE — Telephone Encounter (Signed)
Rx called in as prescribed 

## 2013-08-11 NOTE — Telephone Encounter (Signed)
Electronic refill request, please advise  

## 2013-08-11 NOTE — Telephone Encounter (Signed)
Px written for call in   

## 2013-08-26 ENCOUNTER — Other Ambulatory Visit: Payer: Self-pay | Admitting: Family Medicine

## 2013-08-26 NOTE — Telephone Encounter (Signed)
Electronic refill request, please advise  

## 2013-08-26 NOTE — Telephone Encounter (Signed)
Will refill electronically  

## 2013-09-08 ENCOUNTER — Other Ambulatory Visit: Payer: Self-pay | Admitting: Family Medicine

## 2013-10-06 ENCOUNTER — Other Ambulatory Visit: Payer: Self-pay | Admitting: Family Medicine

## 2013-10-07 NOTE — Telephone Encounter (Signed)
Given 30 pills with 5 refills in June ? -according to the chart- am I wrong?

## 2013-10-07 NOTE — Telephone Encounter (Signed)
Last filled 08/11/2013

## 2013-10-08 NOTE — Telephone Encounter (Signed)
Rx declined  

## 2013-11-07 ENCOUNTER — Ambulatory Visit: Payer: Self-pay | Admitting: Family Medicine

## 2013-11-10 ENCOUNTER — Encounter: Payer: Self-pay | Admitting: Family Medicine

## 2013-11-10 ENCOUNTER — Ambulatory Visit (INDEPENDENT_AMBULATORY_CARE_PROVIDER_SITE_OTHER): Payer: 59 | Admitting: Family Medicine

## 2013-11-10 VITALS — BP 122/86 | HR 114 | Temp 98.0°F | Ht 64.0 in | Wt 193.8 lb

## 2013-11-10 DIAGNOSIS — Z23 Encounter for immunization: Secondary | ICD-10-CM

## 2013-11-10 DIAGNOSIS — F410 Panic disorder [episodic paroxysmal anxiety] without agoraphobia: Secondary | ICD-10-CM

## 2013-11-10 DIAGNOSIS — Z862 Personal history of diseases of the blood and blood-forming organs and certain disorders involving the immune mechanism: Secondary | ICD-10-CM

## 2013-11-10 DIAGNOSIS — R635 Abnormal weight gain: Secondary | ICD-10-CM

## 2013-11-10 DIAGNOSIS — Z8639 Personal history of other endocrine, nutritional and metabolic disease: Secondary | ICD-10-CM

## 2013-11-10 LAB — TSH: TSH: 2.12 u[IU]/mL (ref 0.35–4.50)

## 2013-11-10 LAB — T4, FREE: Free T4: 0.79 ng/dL (ref 0.60–1.60)

## 2013-11-10 NOTE — Assessment & Plan Note (Signed)
Pt dropped back to 40 mg on paxil and doing well / better coping skills Reviewed stressors/ coping techniques/symptoms/ support sources/ tx options and side effects in detail today She may try dropping to 20 mg in the interest of trying to loose wt - we disc what to expect and she will think about it

## 2013-11-10 NOTE — Patient Instructions (Signed)
Check out the APP called myfitnesspal  Also weight watchers  Think about increasing the intensity of exercise -consider a gym   For nasal spray - flonase or nasacort are otc to try  I would stay with crestor  Thyroid labs today

## 2013-11-10 NOTE — Assessment & Plan Note (Signed)
tsh and free T4 today Pt is gaining wt and wants to know that these are stable

## 2013-11-10 NOTE — Progress Notes (Signed)
Pre visit review using our clinic review tool, if applicable. No additional management support is needed unless otherwise documented below in the visit note. 

## 2013-11-10 NOTE — Progress Notes (Signed)
Subjective:    Patient ID: Anita Harrell, female    DOB: 14-Apr-1950, 63 y.o.   MRN: [id number]  HPI Here for anxiety and also to get her flu shot   Is going on medicare soon  She will have some med coverage changes  This makes her nervous  Her weight is a problem  Wt is up 4 more lb with bmi of 33  She eats extremely healthy diet with small portions  She rides her bike about 30-45 minutes  She got an ab lounger also   She could join a gym  Has tried wt watchers - she does follow some rec on line    Anxiety - she increased her paxil to 60 mg and then did not note a difference so she decreased it back to 40  Doing "as well as she can be expected" for a lot of stress and grief     Has 1/2 thyroid - hx of goiter with surgery She is not on thyroid supplementation  No hair or skin changes Some fatigue  Lab Results  Component Value Date   TSH 1.83 07/23/2013   she wants to check them today  Patient Active Problem List   Diagnosis Date Noted  . Pain of left side of body 02/21/2013  . Grief reaction 11/26/2012  . Microscopic hematuria 08/13/2012  . Routine general medical examination at a health care facility 07/22/2012  . LEUKOCYTOSIS 03/30/2010  . HYPERGLYCEMIA, MILD 02/21/2008  . HYPERTENSION, BENIGN ESSENTIAL 01/22/2008  . ALLERGIC RHINITIS 07/25/2007  . BACK PAIN 06/26/2007  . Personal history of goiter 07/27/2006  . HYPERLIPIDEMIA 07/27/2006  . ANXIETY 07/27/2006  . PANIC DISORDER 07/27/2006  . Former smoker 07/27/2006  . DEPRESSION 07/27/2006  . INSOMNIA, HX OF 07/27/2006   Past Medical History  Diagnosis Date  . Chest pain   . Epigastric pain   . Hypertension   . Hyperglycemia     mild  . Allergy     allergic rhinitis  . Back pain   . Goiter   . History of tobacco abuse   . Panic disorder     History of  . Insomnia     Hx of  . Hyperlipidemia   . Depression   . Anxiety   . Labile blood pressure   . SVD (spontaneous vaginal delivery)     x  2   Past Surgical History  Procedure Laterality Date  . Elbow surgery  2004  . Thyroid surgery      B9 massess  . Wisdom tooth extraction    . Eye surgery      bilateral lasik  . Colonoscopy    . Abdominal hysterectomy N/A 09/24/2012    Procedure: HYSTERECTOMY ABDOMINAL;  Surgeon: Gus Height, MD;  Location: [facility];  Service: Gynecology;  Laterality: N/A;  . Salpingoophorectomy Bilateral 09/24/2012    Procedure: SALPINGO OOPHORECTOMY;  Surgeon: Gus Height, MD;  Location: [facility];  Service: Gynecology;  Laterality: Bilateral;  . Wart fulguration N/A 09/24/2012    Procedure: FULGURATION VAGINAL WART;  Surgeon: Gus Height, MD;  Location: [facility];  Service: Gynecology;  Laterality: N/A;   History  Substance Use Topics  . Smoking status: Former Smoker -- 1.00 packs/day for 20 years    Types: Cigarettes    Quit date: 12/11/2008  . Smokeless tobacco: Never Used  . Alcohol Use: No   Family History  Problem Relation Age of Onset  . Hypertension Mother   . Stroke Mother   .  Alcohol abuse Father   . Cancer Father     lung CA  . Heart disease Father     CHF  . Cancer Sister     breast CA  . Heart disease Sister     CHF from chemotx also has defib   Allergies  Allergen Reactions  . Codeine Nausea And Vomiting    REACTION: Nausea and vomiting Pt has tolerated vicodin & percocet in the past  . Wellbutrin [Bupropion]    Current Outpatient Prescriptions on File Prior to Visit  Medication Sig Dispense Refill  . ALPRAZolam (XANAX) 0.5 MG tablet Take 0.5 tablets (0.25 mg total) by mouth daily as needed for anxiety.  30 tablet  0  . aspirin 81 MG tablet Take 81 mg by mouth daily.      . CRESTOR 10 MG tablet TAKE 1 TABLET (10 MG TOTAL) BY MOUTH DAILY.  30 tablet  5  . cyclobenzaprine (FLEXERIL) 10 MG tablet TAKE 1/2 TO 1 TABLET BY MOUTH AT BEDTIME AS NEEDED FOR MUSCLE SPASMS  30 tablet  3  . lisinopril-hydrochlorothiazide (PRINZIDE,ZESTORETIC) 10-12.5 MG per tablet TAKE 1 TABLET BY MOUTH  DAILY.  30 tablet  5  . Multiple Vitamin (MULTIVITAMIN) tablet Take 1 tablet by mouth daily.       Marland Kitchen NASONEX 50 MCG/ACT nasal spray PLACE 2 SPRAYS INTO THE NOSE DAILY.  17 g  5  . Omega-3 Fatty Acids (FISH OIL PO) Take 1 capsule by mouth daily.      Marland Kitchen zolpidem (AMBIEN CR) 12.5 MG CR tablet TAKE 1 TABLET BY MOUTH AT BEDTIME AS NEEDED SLEEP  30 tablet  5   No current facility-administered medications on file prior to visit.     Review of Systems Review of Systems  Constitutional: Negative for fever, appetite change, fatigue and pos for wt gain  Eyes: Negative for pain and visual disturbance.  Respiratory: Negative for cough and shortness of breath.   Cardiovascular: Negative for cp or palpitations    Gastrointestinal: Negative for nausea, diarrhea and constipation.  Genitourinary: Negative for urgency and frequency.  Skin: Negative for pallor or rash   Neurological: Negative for weakness, light-headedness, numbness and headaches.  Hematological: Negative for adenopathy. Does not bruise/bleed easily.  Psychiatric/Behavioral: Negative for dysphoric mood. Pos for anxiety         Objective:   Physical Exam  Constitutional: She appears well-developed and well-nourished. No distress.  obese and well appearing   HENT:  Head: Normocephalic and atraumatic.  Right Ear: External ear normal.  Left Ear: External ear normal.  Nose: Nose normal.  Mouth/Throat: Oropharynx is clear and moist.  Eyes: Conjunctivae and EOM are normal. Pupils are equal, round, and reactive to light. Right eye exhibits no discharge. Left eye exhibits no discharge. No scleral icterus.  Neck: Normal range of motion. Neck supple. No JVD present. Carotid bruit is not present. No thyromegaly present.  No goiter noted    Cardiovascular: Regular rhythm, normal heart sounds and intact distal pulses.  Exam reveals no gallop.   Rate of low 90s at rest   Pulmonary/Chest: Effort normal and breath sounds normal. No respiratory  distress. She has no wheezes. She has no rales.  Abdominal: Soft. Bowel sounds are normal. She exhibits no distension and no mass. There is no tenderness.  Musculoskeletal: She exhibits no edema and no tenderness.  Lymphadenopathy:    She has no cervical adenopathy.  Neurological: She is alert. She has normal reflexes. No cranial nerve deficit. She  exhibits normal muscle tone. Coordination normal.  Skin: Skin is warm and dry. No rash noted. No erythema. No pallor.  Psychiatric: She has a normal mood and affect.  Mildly anxious  Not tearful           Assessment & Plan:   Problem List Items Addressed This Visit     Other   Personal history of goiter     tsh and free T4 today Pt is gaining wt and wants to know that these are stable     Relevant Orders      TSH (Completed)      T4, Free (Completed)   PANIC DISORDER     Pt dropped back to 40 mg on paxil and doing well / better coping skills Reviewed stressors/ coping techniques/symptoms/ support sources/ tx options and side effects in detail today She may try dropping to 20 mg in the interest of trying to loose wt - we disc what to expect and she will think about it     Weight gain - Primary     Long disc re: strategy for wt loss  She is really trying  Suggested taking exercise up  -consider a gym Also disc accountability program-see [facility] for thyroid today paxil may add to diff loosing wt as well     Relevant Orders      TSH (Completed)    Other Visit Diagnoses   Need for prophylactic vaccination and inoculation against influenza        Relevant Orders       Flu Vaccine QUAD 36+ mos PF IM (Fluarix Quad PF) (Completed)

## 2013-11-10 NOTE — Assessment & Plan Note (Signed)
Long disc re: strategy for wt loss  She is really trying  Suggested taking exercise up  -consider a gym Also disc accountability program-see [facility] for thyroid today paxil may add to diff loosing wt as well

## 2013-11-11 ENCOUNTER — Encounter: Payer: Self-pay | Admitting: *Deleted

## 2014-01-16 ENCOUNTER — Encounter: Payer: Self-pay | Admitting: Internal Medicine

## 2014-01-29 ENCOUNTER — Other Ambulatory Visit: Payer: Self-pay | Admitting: Family Medicine

## 2014-01-31 ENCOUNTER — Other Ambulatory Visit: Payer: Self-pay | Admitting: Family Medicine

## 2014-02-02 NOTE — Telephone Encounter (Signed)
Electronic refill request, please advise  

## 2014-02-02 NOTE — Telephone Encounter (Signed)
Px written for call in   

## 2014-02-02 NOTE — Telephone Encounter (Signed)
Rx called in as prescribed 

## 2014-03-13 HISTORY — PX: BREAST BIOPSY: SHX20

## 2014-03-18 ENCOUNTER — Telehealth: Payer: Self-pay

## 2014-03-18 NOTE — Telephone Encounter (Signed)
That is on back order right now - the only pharmacy that has is [facility] [address] in [place]  Is she ok getting it there?

## 2014-03-18 NOTE — Telephone Encounter (Signed)
Pt left v/m; pt has changed ins co and [facility] will not cover zolpidem 12.5 mg; cost to pt is $156.00 out of pocket. Pt said zolpidem 10 mg is covered and pt request zolpidem 10 mg rx. Pt request cb.

## 2014-03-18 NOTE — Telephone Encounter (Signed)
Spoke with [name].  She states she currently doesn't need a refill on her Ambien but just wanted it changed on her medication list so when she does need a refill, we will send in the 10 mg instead of the 12.5mg .  She also wanted to let Dr. Glori Bickers know she has weaned herself down on her Paxil to 20 mg daily and is doing well on that dose.  She would like that also changed on her medication list.  Medication list updated.

## 2014-03-30 ENCOUNTER — Other Ambulatory Visit: Payer: Self-pay

## 2014-03-30 MED ORDER — PAROXETINE HCL 20 MG PO TABS: 20.0000 mg | ORAL_TABLET | Freq: Every day | ORAL | Status: DC

## 2014-03-30 MED ORDER — ZOLPIDEM TARTRATE 10 MG PO TABS: 10.0000 mg | ORAL_TABLET | Freq: Every evening | ORAL | Status: DC | PRN

## 2014-03-30 NOTE — Telephone Encounter (Signed)
Pt left v/m requesting refill zolpidem and paroxetine to [facility].Please advise.

## 2014-03-30 NOTE — Telephone Encounter (Signed)
Rx called in to pharmacy. 

## 2014-03-30 NOTE — Telephone Encounter (Signed)
Px written for call in   

## 2014-05-21 DIAGNOSIS — Z01419 Encounter for gynecological examination (general) (routine) without abnormal findings: Secondary | ICD-10-CM | POA: Diagnosis not present

## 2014-05-21 DIAGNOSIS — Z1231 Encounter for screening mammogram for malignant neoplasm of breast: Secondary | ICD-10-CM | POA: Diagnosis not present

## 2014-05-21 DIAGNOSIS — Z124 Encounter for screening for malignant neoplasm of cervix: Secondary | ICD-10-CM | POA: Diagnosis not present

## 2014-05-22 ENCOUNTER — Other Ambulatory Visit: Payer: Self-pay | Admitting: Obstetrics and Gynecology

## 2014-05-22 DIAGNOSIS — R928 Other abnormal and inconclusive findings on diagnostic imaging of breast: Secondary | ICD-10-CM

## 2014-05-24 ENCOUNTER — Other Ambulatory Visit: Payer: Self-pay | Admitting: Family Medicine

## 2014-05-25 NOTE — Telephone Encounter (Signed)
Ambien refill request.  Last seen 11/10/2013.  Last filled 03/30/2014.  Please advise.

## 2014-05-25 NOTE — Telephone Encounter (Signed)
[facility]

## 2014-05-25 NOTE — Telephone Encounter (Signed)
Px written for call in   

## 2014-05-27 ENCOUNTER — Other Ambulatory Visit: Payer: Self-pay | Admitting: Obstetrics and Gynecology

## 2014-05-27 ENCOUNTER — Ambulatory Visit
Admission: RE | Admit: 2014-05-27 | Discharge: 2014-05-27 | Disposition: A | Discharge status: Home / Self Care | Payer: Medicare Other | Source: Ambulatory Visit | Attending: Obstetrics and Gynecology | Admitting: Obstetrics and Gynecology

## 2014-05-27 DIAGNOSIS — R928 Other abnormal and inconclusive findings on diagnostic imaging of breast: Secondary | ICD-10-CM

## 2014-05-27 DIAGNOSIS — N63 Unspecified lump in breast: Secondary | ICD-10-CM | POA: Diagnosis not present

## 2014-06-02 ENCOUNTER — Other Ambulatory Visit: Payer: Self-pay | Admitting: Obstetrics and Gynecology

## 2014-06-02 ENCOUNTER — Ambulatory Visit
Admission: RE | Admit: 2014-06-02 | Discharge: 2014-06-02 | Disposition: A | Discharge status: Home / Self Care | Payer: Medicare Other | Source: Ambulatory Visit | Attending: Obstetrics and Gynecology | Admitting: Obstetrics and Gynecology

## 2014-06-02 DIAGNOSIS — N6489 Other specified disorders of breast: Secondary | ICD-10-CM | POA: Diagnosis not present

## 2014-06-02 DIAGNOSIS — R928 Other abnormal and inconclusive findings on diagnostic imaging of breast: Secondary | ICD-10-CM

## 2014-06-02 DIAGNOSIS — N63 Unspecified lump in breast: Secondary | ICD-10-CM | POA: Diagnosis not present

## 2014-07-27 ENCOUNTER — Other Ambulatory Visit: Payer: Self-pay | Admitting: Family Medicine

## 2014-07-27 NOTE — Telephone Encounter (Signed)
Electronic refill request, no recent/future appt., last OV was 11/10/2013(acute) and last CPE was 07/23/2013, please advise

## 2014-07-27 NOTE — Telephone Encounter (Signed)
Please schedule f/u appt and refill until then

## 2014-07-30 NOTE — Telephone Encounter (Signed)
appt scheduled and med refilled until then

## 2014-08-11 ENCOUNTER — Encounter: Payer: Self-pay | Admitting: Family Medicine

## 2014-08-11 ENCOUNTER — Ambulatory Visit (INDEPENDENT_AMBULATORY_CARE_PROVIDER_SITE_OTHER): Payer: Medicare Other | Admitting: Family Medicine

## 2014-08-11 VITALS — BP 128/68 | HR 101 | Temp 98.5°F | Ht 64.0 in | Wt 194.5 lb

## 2014-08-11 DIAGNOSIS — J209 Acute bronchitis, unspecified: Secondary | ICD-10-CM | POA: Diagnosis not present

## 2014-08-11 MED ORDER — AZITHROMYCIN 250 MG PO TABS: ORAL_TABLET | ORAL | Status: DC

## 2014-08-11 MED ORDER — HYDROCOD POLST-CPM POLST ER 10-8 MG/5ML PO SUER: 5.0000 mL | Freq: Two times a day (BID) | ORAL | Status: DC | PRN

## 2014-08-11 MED ORDER — PREDNISONE 10 MG PO TABS: ORAL_TABLET | ORAL | Status: DC

## 2014-08-11 NOTE — Progress Notes (Signed)
Pre visit review using our clinic review tool, if applicable. No additional management support is needed unless otherwise documented below in the visit note. 

## 2014-08-11 NOTE — Patient Instructions (Signed)
I think you have bronchitis with reactive airways (asthmatic bronchitis) Drink lots of fluids and rest  Take the zpak as directed Prednisone for wheezing as directed -it may make you hyper and hungry  tussionex as needed for cough-this is sedating / don't mix with other sedating medications and do not drive with it   Update if not starting to improve in a week or if worsening

## 2014-08-11 NOTE — Assessment & Plan Note (Signed)
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

## 2014-08-11 NOTE — Progress Notes (Signed)
Subjective:    Patient ID: Anita Harrell, female    DOB: August 20, 1950, 64 y.o.   MRN: [id number]  HPI Here with uri symptoms   Last week - ST  Then congestion  Then otc cold med for pt's with HTN  Wheezing  Coughing really bad - prod of white and yellow mucous  Some facial pain under eyes   No fever   At times felt a bit flushed and hot   Using nasonex   Does not have an asthma inhaler /never used one    Patient Active Problem List   Diagnosis Date Noted  . Weight gain 11/10/2013  . Pain of left side of body 02/21/2013  . Grief reaction 11/26/2012  . Microscopic hematuria 08/13/2012  . Routine general medical examination at a health care facility 07/22/2012  . LEUKOCYTOSIS 03/30/2010  . HYPERGLYCEMIA, MILD 02/21/2008  . HYPERTENSION, BENIGN ESSENTIAL 01/22/2008  . ALLERGIC RHINITIS 07/25/2007  . BACK PAIN 06/26/2007  . Personal history of goiter 07/27/2006  . HYPERLIPIDEMIA 07/27/2006  . ANXIETY 07/27/2006  . PANIC DISORDER 07/27/2006  . Former smoker 07/27/2006  . DEPRESSION 07/27/2006  . INSOMNIA, HX OF 07/27/2006   Past Medical History  Diagnosis Date  . Chest pain   . Epigastric pain   . Hypertension   . Hyperglycemia     mild  . Allergy     allergic rhinitis  . Back pain   . Goiter   . History of tobacco abuse   . Panic disorder     History of  . Insomnia     Hx of  . Hyperlipidemia   . Depression   . Anxiety   . Labile blood pressure   . SVD (spontaneous vaginal delivery)     x 2   Past Surgical History  Procedure Laterality Date  . Elbow surgery  2004  . Thyroid surgery      B9 massess  . Wisdom tooth extraction    . Eye surgery      bilateral lasik  . Colonoscopy    . Abdominal hysterectomy N/A 09/24/2012    Procedure: HYSTERECTOMY ABDOMINAL;  Surgeon: Gus Height, MD;  Location: [facility];  Service: Gynecology;  Laterality: N/A;  . Salpingoophorectomy Bilateral 09/24/2012    Procedure: SALPINGO OOPHORECTOMY;  Surgeon: Gus Height,  MD;  Location: [facility];  Service: Gynecology;  Laterality: Bilateral;  . Wart fulguration N/A 09/24/2012    Procedure: FULGURATION VAGINAL WART;  Surgeon: Gus Height, MD;  Location: [facility];  Service: Gynecology;  Laterality: N/A;   History  Substance Use Topics  . Smoking status: Former Smoker -- 1.00 packs/day for 20 years    Types: Cigarettes    Quit date: 12/11/2008  . Smokeless tobacco: Never Used  . Alcohol Use: No   Family History  Problem Relation Age of Onset  . Hypertension Mother   . Stroke Mother   . Alcohol abuse Father   . Cancer Father     lung CA  . Heart disease Father     CHF  . Cancer Sister     breast CA  . Heart disease Sister     CHF from chemotx also has defib   Allergies  Allergen Reactions  . Codeine Nausea And Vomiting    REACTION: Nausea and vomiting Pt has tolerated vicodin & percocet in the past  . Lipitor [Atorvastatin]     Muscle pain   . Wellbutrin [Bupropion]    Current Outpatient Prescriptions on File  Prior to Visit  Medication Sig Dispense Refill  . ALPRAZolam (XANAX) 0.5 MG tablet Take 0.5 tablets (0.25 mg total) by mouth daily as needed for anxiety. 30 tablet 0  . aspirin 81 MG tablet Take 81 mg by mouth daily.    . CRESTOR 10 MG tablet TAKE 1 TABLET (10 MG TOTAL) BY MOUTH DAILY. 30 tablet 0  . cyclobenzaprine (FLEXERIL) 10 MG tablet TAKE 1/2 TO 1 TABLET BY MOUTH AT BEDTIME AS NEEDED FOR MUSCLE SPASMS 30 tablet 3  . lisinopril-hydrochlorothiazide (PRINZIDE,ZESTORETIC) 10-12.5 MG per tablet TAKE 1 TABLET BY MOUTH DAILY. 30 tablet 0  . Multiple Vitamin (MULTIVITAMIN) tablet Take 1 tablet by mouth daily.     Marland Kitchen NASONEX 50 MCG/ACT nasal spray PLACE 2 SPRAYS INTO THE NOSE DAILY. 17 g 5  . Omega-3 Fatty Acids (FISH OIL PO) Take 1 capsule by mouth daily.    Marland Kitchen PARoxetine (PAXIL) 20 MG tablet Take 1 tablet (20 mg total) by mouth daily. 30 tablet 5  . zolpidem (AMBIEN) 10 MG tablet TAKE 1 TABLET BY MOUTH AT BEDTIME 30 tablet 3   No current  facility-administered medications on file prior to visit.      Review of Systems Review of Systems  Constitutional: Negative for fever, appetite change,  and unexpected weight change.  ENt pos for congestion and rhinorrhea and ST ,neg for sinus pain  Eyes: Negative for pain and visual disturbance.  Respiratory: Negative for shortness of breath.  pos for cough with wheeze  Cardiovascular: Negative for cp or palpitations    Gastrointestinal: Negative for nausea, diarrhea and constipation.  Genitourinary: Negative for urgency and frequency.  Skin: Negative for pallor or rash   Neurological: Negative for weakness, light-headedness, numbness and headaches.  Hematological: Negative for adenopathy. Does not bruise/bleed easily.  Psychiatric/Behavioral: Negative for dysphoric mood. The patient is not nervous/anxious.         Objective:   Physical Exam  Constitutional: She appears well-developed and well-nourished. No distress.  obese and well appearing   HENT:  Head: Normocephalic and atraumatic.  Right Ear: External ear normal.  Left Ear: External ear normal.  Mouth/Throat: Oropharynx is clear and moist.  Nares are injected and congested  No sinus tenderness Clear rhinorrhea and post nasal drip   Eyes: Conjunctivae and EOM are normal. Pupils are equal, round, and reactive to light. Right eye exhibits no discharge. Left eye exhibits no discharge.  Neck: Normal range of motion. Neck supple.  Cardiovascular: Normal rate and normal heart sounds.   Pulmonary/Chest: Effort normal. No respiratory distress. She has wheezes. She has no rales. She exhibits no tenderness.  Diffuse rhonchi with fair air exch occ end exp wheeze Cough is dry/harsh  No rales  Lymphadenopathy:    She has no cervical adenopathy.  Neurological: She is alert.  Skin: Skin is warm and dry. No rash noted.  Psychiatric: Her mood appears anxious. She does not exhibit a depressed mood.          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

## 2014-08-21 ENCOUNTER — Encounter: Payer: Self-pay | Admitting: Family Medicine

## 2014-08-21 ENCOUNTER — Ambulatory Visit (INDEPENDENT_AMBULATORY_CARE_PROVIDER_SITE_OTHER): Payer: Medicare Other | Admitting: Family Medicine

## 2014-08-21 VITALS — BP 104/66 | HR 100 | Temp 98.4°F | Ht 64.0 in | Wt 194.8 lb

## 2014-08-21 DIAGNOSIS — F411 Generalized anxiety disorder: Secondary | ICD-10-CM | POA: Diagnosis not present

## 2014-08-21 DIAGNOSIS — I10 Essential (primary) hypertension: Secondary | ICD-10-CM | POA: Diagnosis not present

## 2014-08-21 DIAGNOSIS — R739 Hyperglycemia, unspecified: Secondary | ICD-10-CM

## 2014-08-21 DIAGNOSIS — Z23 Encounter for immunization: Secondary | ICD-10-CM | POA: Diagnosis not present

## 2014-08-21 DIAGNOSIS — E785 Hyperlipidemia, unspecified: Secondary | ICD-10-CM | POA: Diagnosis not present

## 2014-08-21 LAB — CBC WITH DIFFERENTIAL/PLATELET
BASOS ABS: 0.1 10*3/uL (ref 0.0–0.1)
Basophils Relative: 0.4 % (ref 0.0–3.0)
EOS ABS: 0.1 10*3/uL (ref 0.0–0.7)
Eosinophils Relative: 0.4 % (ref 0.0–5.0)
HEMATOCRIT: 40 % (ref 36.0–46.0)
Hemoglobin: 13.4 g/dL (ref 12.0–15.0)
LYMPHS ABS: 3 10*3/uL (ref 0.7–4.0)
LYMPHS PCT: 21.8 % (ref 12.0–46.0)
MCHC: 33.4 g/dL (ref 30.0–36.0)
MCV: 88.1 fl (ref 78.0–100.0)
MONO ABS: 2.9 10*3/uL — AB (ref 0.1–1.0)
Monocytes Relative: 20.5 % — ABNORMAL HIGH (ref 3.0–12.0)
Neutro Abs: 7.9 10*3/uL — ABNORMAL HIGH (ref 1.4–7.7)
Neutrophils Relative %: 56.9 % (ref 43.0–77.0)
Platelets: 218 10*3/uL (ref 150.0–400.0)
RBC: 4.54 Mil/uL (ref 3.87–5.11)
RDW: 14.3 % (ref 11.5–15.5)
WBC: 13.9 10*3/uL — AB (ref 4.0–10.5)

## 2014-08-21 LAB — LIPID PANEL
CHOL/HDL RATIO: 3
Cholesterol: 157 mg/dL (ref 0–200)
HDL: 48.4 mg/dL (ref >39.00)
NonHDL: 108.6
TRIGLYCERIDES: 273 mg/dL — AB (ref 0.0–149.0)
VLDL: 54.6 mg/dL — ABNORMAL HIGH (ref 0.0–40.0)

## 2014-08-21 LAB — LDL CHOLESTEROL, DIRECT: LDL DIRECT: 73 mg/dL

## 2014-08-21 LAB — HEMOGLOBIN A1C: HEMOGLOBIN A1C: 6 % (ref 4.6–6.5)

## 2014-08-21 LAB — COMPREHENSIVE METABOLIC PANEL
ALT: 18 U/L (ref 0–35)
AST: 18 U/L (ref 0–37)
Albumin: 4.3 g/dL (ref 3.5–5.2)
Alkaline Phosphatase: 60 U/L (ref 39–117)
BUN: 15 mg/dL (ref 6–23)
CHLORIDE: 99 meq/L (ref 96–112)
CO2: 28 mEq/L (ref 19–32)
Calcium: 9.7 mg/dL (ref 8.4–10.5)
Creatinine, Ser: 0.64 mg/dL (ref 0.40–1.20)
GFR: 98.84 mL/min (ref >60.00)
Glucose, Bld: 98 mg/dL (ref 70–99)
POTASSIUM: 3.7 meq/L (ref 3.5–5.1)
Sodium: 135 mEq/L (ref 135–145)
TOTAL PROTEIN: 7.4 g/dL (ref 6.0–8.3)
Total Bilirubin: 0.5 mg/dL (ref 0.2–1.2)

## 2014-08-21 LAB — TSH: TSH: 1.19 u[IU]/mL (ref 0.35–4.50)

## 2014-08-21 MED ORDER — PAROXETINE HCL 20 MG PO TABS: 20.0000 mg | ORAL_TABLET | Freq: Every day | ORAL | Status: DC

## 2014-08-21 MED ORDER — ROSUVASTATIN CALCIUM 10 MG PO TABS: ORAL_TABLET | ORAL | Status: DC

## 2014-08-21 MED ORDER — LISINOPRIL-HYDROCHLOROTHIAZIDE 10-12.5 MG PO TABS: 1.0000 | ORAL_TABLET | Freq: Every day | ORAL | Status: DC

## 2014-08-21 MED ORDER — HYDROCOD POLST-CPM POLST ER 10-8 MG/5ML PO SUER: 5.0000 mL | Freq: Two times a day (BID) | ORAL | Status: DC | PRN

## 2014-08-21 MED ORDER — ALPRAZOLAM 0.5 MG PO TABS: 0.5000 mg | ORAL_TABLET | Freq: Every day | ORAL | Status: DC | PRN

## 2014-08-21 MED ORDER — ZOLPIDEM TARTRATE 10 MG PO TABS: 10.0000 mg | ORAL_TABLET | Freq: Every day | ORAL | Status: DC

## 2014-08-21 NOTE — Progress Notes (Signed)
Pre visit review using our clinic review tool, if applicable. No additional management support is needed unless otherwise documented below in the visit note. 

## 2014-08-21 NOTE — Progress Notes (Signed)
Subjective:    Patient ID: Anita Harrell, female    DOB: 1951-01-27, 64 y.o.   MRN: [id number]  HPI Here for f/u of chronic health problems  Last visit for bronchitis 5/31 Zpak/prednisone px along with tussionex  Doing better but still coughing Needs refill of tussionex (does not take ambien when on that)    Wt is stable with bmi of 33  Obese Habits -not eating as much lately  Working hard on diet  Not exercising - - even though she joined [employer] -not enough time     bp is stable today  No cp or palpitations or headaches or edema  No side effects to medicines  BP Readings from Last 3 Encounters:  08/21/14 104/66  08/11/14 128/68  11/10/13 122/86      Hyperlipidemia Lab Results  Component Value Date   CHOL 112 07/23/2013   HDL 44.80 07/23/2013   LDLCALC 51 07/23/2013   TRIG 81.0 07/23/2013   CHOLHDL 3 07/23/2013    Statin and diet  Eats lots of vegetables and chicken - she works really hard on diet  Due for a check   Mood On paxil and alprazolam Was on 60 of paxil-felt too zoned- she is back down to 20 and doing fine (10 is too little)  Takes xanax about one daily  Has panic attacks   Patient Active Problem List   Diagnosis Date Noted  . Acute bronchitis 08/11/2014  . Weight gain 11/10/2013  . Pain of left side of body 02/21/2013  . Grief reaction 11/26/2012  . Microscopic hematuria 08/13/2012  . Routine general medical examination at a health care facility 07/22/2012  . LEUKOCYTOSIS 03/30/2010  . Hyperglycemia 02/21/2008  . HYPERTENSION, BENIGN ESSENTIAL 01/22/2008  . ALLERGIC RHINITIS 07/25/2007  . BACK PAIN 06/26/2007  . Personal history of goiter 07/27/2006  . Hyperlipidemia 07/27/2006  . ANXIETY 07/27/2006  . PANIC DISORDER 07/27/2006  . Former smoker 07/27/2006  . DEPRESSION 07/27/2006  . INSOMNIA, HX OF 07/27/2006   Past Medical History  Diagnosis Date  . Chest pain   . Epigastric pain   . Hypertension   . Hyperglycemia     mild    . Allergy     allergic rhinitis  . Back pain   . Goiter   . History of tobacco abuse   . Panic disorder     History of  . Insomnia     Hx of  . Hyperlipidemia   . Depression   . Anxiety   . Labile blood pressure   . SVD (spontaneous vaginal delivery)     x 2   Past Surgical History  Procedure Laterality Date  . Elbow surgery  2004  . Thyroid surgery      B9 massess  . Wisdom tooth extraction    . Eye surgery      bilateral lasik  . Colonoscopy    . Abdominal hysterectomy N/A 09/24/2012    Procedure: HYSTERECTOMY ABDOMINAL;  Surgeon: Gus Height, MD;  Location: [facility];  Service: Gynecology;  Laterality: N/A;  . Salpingoophorectomy Bilateral 09/24/2012    Procedure: SALPINGO OOPHORECTOMY;  Surgeon: Gus Height, MD;  Location: [facility];  Service: Gynecology;  Laterality: Bilateral;  . Wart fulguration N/A 09/24/2012    Procedure: FULGURATION VAGINAL WART;  Surgeon: Gus Height, MD;  Location: [facility];  Service: Gynecology;  Laterality: N/A;   History  Substance Use Topics  . Smoking status: Former Smoker -- 1.00 packs/day for 20 years  Types: Cigarettes    Quit date: 12/11/2008  . Smokeless tobacco: Never Used  . Alcohol Use: No   Family History  Problem Relation Age of Onset  . Hypertension Mother   . Stroke Mother   . Alcohol abuse Father   . Cancer Father     lung CA  . Heart disease Father     CHF  . Cancer Sister     breast CA  . Heart disease Sister     CHF from chemotx also has defib   Allergies  Allergen Reactions  . Codeine Nausea And Vomiting    REACTION: Nausea and vomiting Pt has tolerated vicodin & percocet in the past  . Lipitor [Atorvastatin]     Muscle pain   . Wellbutrin [Bupropion]    Current Outpatient Prescriptions on File Prior to Visit  Medication Sig Dispense Refill  . ALPRAZolam (XANAX) 0.5 MG tablet Take 0.5 tablets (0.25 mg total) by mouth daily as needed for anxiety. 30 tablet 0  . aspirin 81 MG tablet Take 81 mg by mouth daily.     . CRESTOR 10 MG tablet TAKE 1 TABLET (10 MG TOTAL) BY MOUTH DAILY. 30 tablet 0  . cyclobenzaprine (FLEXERIL) 10 MG tablet TAKE 1/2 TO 1 TABLET BY MOUTH AT BEDTIME AS NEEDED FOR MUSCLE SPASMS 30 tablet 3  . lisinopril-hydrochlorothiazide (PRINZIDE,ZESTORETIC) 10-12.5 MG per tablet TAKE 1 TABLET BY MOUTH DAILY. 30 tablet 0  . Multiple Vitamin (MULTIVITAMIN) tablet Take 1 tablet by mouth daily.     Marland Kitchen NASONEX 50 MCG/ACT nasal spray PLACE 2 SPRAYS INTO THE NOSE DAILY. 17 g 5  . Omega-3 Fatty Acids (FISH OIL PO) Take 1 capsule by mouth daily.    Marland Kitchen PARoxetine (PAXIL) 20 MG tablet Take 1 tablet (20 mg total) by mouth daily. 30 tablet 5  . zolpidem (AMBIEN) 10 MG tablet TAKE 1 TABLET BY MOUTH AT BEDTIME 30 tablet 3   No current facility-administered medications on file prior to visit.     Review of Systems Review of Systems  Constitutional: Negative for fever, appetite change, fatigue and unexpected weight change.  Eyes: Negative for pain and visual disturbance.  Respiratory: Negative for shortness of breath.   Cardiovascular: Negative for cp or palpitations    Gastrointestinal: Negative for nausea, diarrhea and constipation.  Genitourinary: Negative for urgency and frequency.  Skin: Negative for pallor or rash   Neurological: Negative for weakness, light-headedness, numbness and headaches.  Hematological: Negative for adenopathy. Does not bruise/bleed easily.  Psychiatric/Behavioral: Negative for dysphoric mood. Pos for gen anxious mood .         Objective:   Physical Exam  Constitutional: She appears well-developed and well-nourished. No distress.  obese and well appearing   HENT:  Head: Normocephalic and atraumatic.  Mouth/Throat: Oropharynx is clear and moist.  Nares are boggy No sinus tenderness   Eyes: Conjunctivae and EOM are normal. Pupils are equal, round, and reactive to light.  Neck: Normal range of motion. Neck supple. No JVD present. Carotid bruit is not present. No  thyromegaly present.  Cardiovascular: Normal rate, regular rhythm, normal heart sounds and intact distal pulses.  Exam reveals no gallop.   Pulmonary/Chest: Effort normal and breath sounds normal. No respiratory distress. She has no wheezes. She has no rales.  No crackles  Good air exch   Abdominal: Soft. Bowel sounds are normal. She exhibits no distension, no abdominal bruit and no mass. There is no tenderness.  Musculoskeletal: She exhibits no edema.  Lymphadenopathy:    She has no cervical adenopathy.  Neurological: She is alert. She has normal reflexes.  Skin: Skin is warm and dry. No rash noted.  Psychiatric: Her speech is normal and behavior is normal. Thought content normal. Her mood appears anxious. Her affect is not blunt, not labile and not inappropriate. Thought content is not paranoid. She does not exhibit a depressed mood. She expresses no homicidal and no suicidal ideation.  Candid and talkative           Assessment & Plan:   Problem List Items Addressed This Visit    Generalized anxiety disorder    Pt cut her dose of paxil back to 20 and feels better Still needs xanax dose once per day  Reviewed stressors/ coping techniques/symptoms/ support sources/ tx options and side effects in detail today  Recommend counseling  Disc habit forming pot for xanax-using caution        Hyperglycemia    A1C today  Disc low glycemic diet and exercise and wt control to prev DM      Relevant Orders   Hemoglobin A1c (Completed)   Hyperlipidemia    Disc goals for lipids and reasons to control them Rev labs with pt from last draw- labs ordered today  Rev low sat fat diet in detail  Continues crestor which has worked well        Relevant Medications   lisinopril-hydrochlorothiazide (PRINZIDE,ZESTORETIC) 10-12.5 MG per tablet   rosuvastatin (CRESTOR) 10 MG tablet   Other Relevant Orders   Lipid panel (Completed)   HYPERTENSION, BENIGN ESSENTIAL - Primary    bp in fair  control at this time  BP Readings from Last 1 Encounters:  08/21/14 104/66   No changes needed Disc lifstyle change with low sodium diet and exercise  Labs today        Relevant Medications   lisinopril-hydrochlorothiazide (PRINZIDE,ZESTORETIC) 10-12.5 MG per tablet   rosuvastatin (CRESTOR) 10 MG tablet   Other Relevant Orders   CBC with Differential/Platelet (Completed)   Comprehensive metabolic panel (Completed)   TSH (Completed)   Lipid panel (Completed)    Other Visit Diagnoses    Need for vaccination with 13-polyvalent pneumococcal conjugate vaccine        Relevant Orders    Pneumococcal conjugate vaccine 13-valent (Completed)

## 2014-08-21 NOTE — Patient Instructions (Signed)
Take care of yourself  Add exercise when you are ready for weight loss  Use tussionex with caution-I'm glad bronchitis is improving  Use caution with xanax  Labs today

## 2014-08-23 NOTE — Assessment & Plan Note (Signed)
Pt cut her dose of paxil back to 20 and feels better Still needs xanax dose once per day  Reviewed stressors/ coping techniques/symptoms/ support sources/ tx options and side effects in detail today  Recommend counseling  Disc habit forming pot for xanax-using caution

## 2014-08-23 NOTE — Assessment & Plan Note (Signed)
A1C today  Disc low glycemic diet and exercise and wt control to prev DM

## 2014-08-23 NOTE — Assessment & Plan Note (Signed)
bp in fair control at this time  BP Readings from Last 1 Encounters:  08/21/14 104/66   No changes needed Disc lifstyle change with low sodium diet and exercise  Labs today

## 2014-08-23 NOTE — Assessment & Plan Note (Signed)
Disc goals for lipids and reasons to control them Rev labs with pt from last draw- labs ordered today  Rev low sat fat diet in detail  Continues crestor which has worked well

## 2014-08-24 ENCOUNTER — Encounter: Payer: Self-pay | Admitting: *Deleted

## 2014-10-16 ENCOUNTER — Other Ambulatory Visit: Payer: Self-pay | Admitting: *Deleted

## 2014-10-16 MED ORDER — LISINOPRIL-HYDROCHLOROTHIAZIDE 10-12.5 MG PO TABS: 1.0000 | ORAL_TABLET | Freq: Every day | ORAL | Status: DC

## 2014-10-16 NOTE — Telephone Encounter (Signed)
Received fax saying pt's insurance will cover a 90 day supply, Dr. Glori Bickers advise me to refill Rx for a year (25month supply), done

## 2014-11-24 DIAGNOSIS — R87622 Low grade squamous intraepithelial lesion on cytologic smear of vagina (LGSIL): Secondary | ICD-10-CM | POA: Diagnosis not present

## 2014-11-24 DIAGNOSIS — R319 Hematuria, unspecified: Secondary | ICD-10-CM | POA: Diagnosis not present

## 2014-11-24 DIAGNOSIS — N760 Acute vaginitis: Secondary | ICD-10-CM | POA: Diagnosis not present

## 2014-11-24 DIAGNOSIS — R350 Frequency of micturition: Secondary | ICD-10-CM | POA: Diagnosis not present

## 2014-12-08 ENCOUNTER — Other Ambulatory Visit: Payer: Self-pay | Admitting: Obstetrics and Gynecology

## 2014-12-08 ENCOUNTER — Other Ambulatory Visit: Payer: Self-pay

## 2014-12-08 DIAGNOSIS — N6310 Unspecified lump in the right breast, unspecified quadrant: Secondary | ICD-10-CM

## 2014-12-08 DIAGNOSIS — R319 Hematuria, unspecified: Secondary | ICD-10-CM | POA: Diagnosis not present

## 2014-12-08 DIAGNOSIS — A630 Anogenital (venereal) warts: Secondary | ICD-10-CM | POA: Diagnosis not present

## 2014-12-14 ENCOUNTER — Ambulatory Visit
Admission: RE | Admit: 2014-12-14 | Discharge: 2014-12-14 | Disposition: A | Discharge status: Home / Self Care | Payer: Medicare Other | Source: Ambulatory Visit

## 2014-12-14 DIAGNOSIS — N63 Unspecified lump in breast: Secondary | ICD-10-CM | POA: Diagnosis not present

## 2014-12-14 DIAGNOSIS — N6310 Unspecified lump in the right breast, unspecified quadrant: Secondary | ICD-10-CM

## 2014-12-30 ENCOUNTER — Other Ambulatory Visit: Payer: Self-pay | Admitting: Family Medicine

## 2014-12-30 NOTE — Telephone Encounter (Signed)
Zolpidem called into [facility].

## 2014-12-30 NOTE — Telephone Encounter (Signed)
Px written for call in   

## 2014-12-30 NOTE — Telephone Encounter (Signed)
Last office visit 08/21/2014.  Last refilled 08/21/2014 for #30 with 3 refills.  Ok to refill?

## 2015-01-19 DIAGNOSIS — Z6834 Body mass index (BMI) 34.0-34.9, adult: Secondary | ICD-10-CM | POA: Diagnosis not present

## 2015-01-19 DIAGNOSIS — R319 Hematuria, unspecified: Secondary | ICD-10-CM | POA: Diagnosis not present

## 2015-01-19 DIAGNOSIS — A630 Anogenital (venereal) warts: Secondary | ICD-10-CM | POA: Diagnosis not present

## 2015-02-10 DIAGNOSIS — R3121 Asymptomatic microscopic hematuria: Secondary | ICD-10-CM | POA: Diagnosis not present

## 2015-02-10 DIAGNOSIS — R350 Frequency of micturition: Secondary | ICD-10-CM | POA: Diagnosis not present

## 2015-02-16 DIAGNOSIS — N200 Calculus of kidney: Secondary | ICD-10-CM | POA: Diagnosis not present

## 2015-02-16 DIAGNOSIS — R311 Benign essential microscopic hematuria: Secondary | ICD-10-CM | POA: Diagnosis not present

## 2015-02-16 DIAGNOSIS — R3121 Asymptomatic microscopic hematuria: Secondary | ICD-10-CM | POA: Diagnosis not present

## 2015-02-22 DIAGNOSIS — A630 Anogenital (venereal) warts: Secondary | ICD-10-CM | POA: Diagnosis not present

## 2015-02-22 DIAGNOSIS — E669 Obesity, unspecified: Secondary | ICD-10-CM | POA: Diagnosis not present

## 2015-03-14 HISTORY — PX: COLONOSCOPY: SHX174

## 2015-04-01 DIAGNOSIS — R3121 Asymptomatic microscopic hematuria: Secondary | ICD-10-CM | POA: Diagnosis not present

## 2015-04-01 DIAGNOSIS — Z0000 Encounter for general adult medical examination without abnormal findings: Secondary | ICD-10-CM | POA: Diagnosis not present

## 2015-04-01 DIAGNOSIS — R350 Frequency of micturition: Secondary | ICD-10-CM | POA: Diagnosis not present

## 2015-04-26 DIAGNOSIS — Z6834 Body mass index (BMI) 34.0-34.9, adult: Secondary | ICD-10-CM | POA: Diagnosis not present

## 2015-04-26 DIAGNOSIS — F419 Anxiety disorder, unspecified: Secondary | ICD-10-CM | POA: Diagnosis not present

## 2015-04-26 DIAGNOSIS — A630 Anogenital (venereal) warts: Secondary | ICD-10-CM | POA: Diagnosis not present

## 2015-04-28 ENCOUNTER — Other Ambulatory Visit: Payer: Self-pay | Admitting: Obstetrics & Gynecology

## 2015-04-28 DIAGNOSIS — Z1231 Encounter for screening mammogram for malignant neoplasm of breast: Secondary | ICD-10-CM

## 2015-05-01 ENCOUNTER — Other Ambulatory Visit: Payer: Self-pay | Admitting: Family Medicine

## 2015-05-03 NOTE — Telephone Encounter (Signed)
Rx called in as prescribed 

## 2015-05-03 NOTE — Telephone Encounter (Signed)
Px written for call in   

## 2015-05-03 NOTE — Telephone Encounter (Signed)
Last OV was 08/21/14, last refilled on 12/30/14 #30 with 3 additional refill, please advise

## 2015-05-17 ENCOUNTER — Ambulatory Visit: Payer: Medicare Other

## 2015-05-28 ENCOUNTER — Ambulatory Visit
Admission: RE | Admit: 2015-05-28 | Discharge: 2015-05-28 | Disposition: A | Discharge status: Home / Self Care | Payer: Medicare Other | Source: Ambulatory Visit | Attending: Obstetrics & Gynecology | Admitting: Obstetrics & Gynecology

## 2015-05-28 DIAGNOSIS — Z1231 Encounter for screening mammogram for malignant neoplasm of breast: Secondary | ICD-10-CM

## 2015-06-09 ENCOUNTER — Encounter: Payer: Self-pay | Admitting: Family Medicine

## 2015-06-09 ENCOUNTER — Ambulatory Visit (INDEPENDENT_AMBULATORY_CARE_PROVIDER_SITE_OTHER): Payer: Medicare Other | Admitting: Family Medicine

## 2015-06-09 ENCOUNTER — Telehealth: Payer: Self-pay | Admitting: Family Medicine

## 2015-06-09 VITALS — BP 108/76 | HR 96 | Temp 98.0°F | Wt 189.0 lb

## 2015-06-09 DIAGNOSIS — F418 Other specified anxiety disorders: Secondary | ICD-10-CM | POA: Diagnosis not present

## 2015-06-09 MED ORDER — PAROXETINE HCL 40 MG PO TABS: ORAL_TABLET | ORAL | Status: DC

## 2015-06-09 NOTE — Telephone Encounter (Signed)
Patient did not come for their scheduled appointment today  ForF/U DEPRESSION .  Please let me know if the patient needs to be contacted immediately for follow up or if no follow up is necessary.
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
## 2015-06-09 NOTE — Patient Instructions (Signed)
Increase your paxil to 40 mg once daily  Give that 2 weeks and if not improving- increase to 60 mg once daily   If any intolerable side effects or if worse or have suicidal thought- alert [place] immediately and then get help at ER   Try to get a little exercise and outdoor time during the day to help sleep and mood Keep talking to your husband If you want to see a counselor please let us know   Follow up with me in about 4-6 weeks
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
## 2015-06-09 NOTE — Telephone Encounter (Signed)
Oh- she was here-they did not arrive her until after 1 pm - I am going to see her

## 2015-06-09 NOTE — Progress Notes (Signed)
Pre visit review using our clinic review tool, if applicable. No additional management support is needed unless otherwise documented below in the visit note. 

## 2015-06-30 ENCOUNTER — Telehealth: Payer: Self-pay | Admitting: Family Medicine

## 2015-06-30 ENCOUNTER — Ambulatory Visit (INDEPENDENT_AMBULATORY_CARE_PROVIDER_SITE_OTHER)
Admission: RE | Admit: 2015-06-30 | Discharge: 2015-06-30 | Disposition: A | Discharge status: Home / Self Care | Payer: Medicare Other | Source: Ambulatory Visit | Attending: Primary Care | Admitting: Primary Care

## 2015-06-30 ENCOUNTER — Other Ambulatory Visit: Payer: Self-pay | Admitting: Primary Care

## 2015-06-30 ENCOUNTER — Ambulatory Visit (INDEPENDENT_AMBULATORY_CARE_PROVIDER_SITE_OTHER): Payer: Medicare Other | Admitting: Primary Care

## 2015-06-30 VITALS — BP 118/76 | HR 110 | Temp 98.0°F | Ht 64.0 in | Wt 191.0 lb

## 2015-06-30 DIAGNOSIS — R059 Cough, unspecified: Secondary | ICD-10-CM

## 2015-06-30 DIAGNOSIS — R062 Wheezing: Secondary | ICD-10-CM | POA: Diagnosis not present

## 2015-06-30 DIAGNOSIS — R05 Cough: Secondary | ICD-10-CM

## 2015-06-30 DIAGNOSIS — R0602 Shortness of breath: Secondary | ICD-10-CM | POA: Diagnosis not present

## 2015-06-30 MED ORDER — AZITHROMYCIN 250 MG PO TABS: ORAL_TABLET | ORAL | Status: DC

## 2015-06-30 MED ORDER — ALBUTEROL SULFATE HFA 108 (90 BASE) MCG/ACT IN AERS: 2.0000 | INHALATION_SPRAY | Freq: Four times a day (QID) | RESPIRATORY_TRACT | Status: DC | PRN

## 2015-06-30 MED ORDER — HYDROCODONE-HOMATROPINE 5-1.5 MG/5ML PO SYRP: 5.0000 mL | ORAL_SOLUTION | Freq: Three times a day (TID) | ORAL | Status: DC | PRN

## 2015-06-30 NOTE — Patient Instructions (Addendum)
Complete xray(s) prior to leaving today. I will notify you of your results once received.  Inhale 2 puffs of the albuterol inhaler every 6 hours as needed for wheezing or shortness of breath.  You may take the Hycodan cough suppressant every 8 hours as needed for cough and rest. Caution this medication contains codeine and will make you feel drowsy.  Cough/Congestion: Try taking Mucinex DM. This will help loosen up the mucous in your chest. Ensure you take this medication with a full glass of water.  Nasal Congestion: Try using Flonase (fluticasone) nasal spray. Instill 2 sprays in each nostril once daily.   I will be in touch with you later today.  It was a pleasure meeting you!

## 2015-06-30 NOTE — Progress Notes (Signed)
Pre visit review using our clinic review tool, if applicable. No additional management support is needed unless otherwise documented below in the visit note. 

## 2015-06-30 NOTE — Telephone Encounter (Signed)
Patient Name: Anita Harrell DOB: 08-18-1950 Initial Comment Caller states she has cough, wheezing, chest pain. Nurse Assessment Nurse: Ronnald Ramp, RN, Miranda Date/Time (Eastern Time): 06/30/2015 10:25:09 AM Confirm and document reason for call. If symptomatic, describe symptoms. You must click the next button to save text entered. ---Caller states she has a productive cough since Sunday. She has some noisy breathing when laying down. She has chest pain when coughing. Denies fever. Has the patient traveled out of the country within the last 30 days? ---No Does the patient have any new or worsening symptoms? ---Yes Will a triage be completed? ---Yes Related visit to physician within the last 2 weeks? ---No Does the PT have any chronic conditions? (i.e. diabetes, asthma, etc.) ---Yes List chronic conditions. ---Anxiety, High Cholesterol, Insomnia Is this a behavioral health or substance abuse call? ---No Guidelines Guideline Title Affirmed Question Affirmed Notes Cough - Acute Productive SEVERE coughing spells (e.g., whooping sound after coughing, vomiting after coughing) Final Disposition User See Physician within 24 Hours Ronnald Ramp, RN, Miranda Comments No appt available with PCP, appt scheduled for tomorrow 4/20, at 10:45am with Gordy Councilman NP. Referrals REFERRED TO PCP OFFICE Disagree/Comply:

## 2015-06-30 NOTE — Telephone Encounter (Signed)
TH scheduled appt with Allie Bossier NP on 07/01/15 at 10:45.

## 2015-06-30 NOTE — Telephone Encounter (Signed)
Noted  

## 2015-06-30 NOTE — Progress Notes (Signed)
Subjective:    Patient ID: Anita Harrell, female    DOB: [birth year], 65 y.o.   MRN: [id number]  HPI  Anita Harrell is a 65 year old female who presents today with a chief complaint of cough. She also reports sore throat, wheezing, nasal congestion, fatigue. Her symptoms have been present for the past 4 days. She denies fevers, sinus pressure. Her cough is non productive. She's taken Coricidin and hycodan cough syrup from last years prescription with temporary improvement.  Denies sick contacts. Overall she's feeling worse as she is more fatigued.   Review of Systems  Constitutional: Positive for chills and fatigue. Negative for fever.  HENT: Positive for congestion and sore throat. Negative for postnasal drip and sinus pressure.   Respiratory: Positive for cough, shortness of breath and wheezing.   Cardiovascular: Negative for chest pain.  Musculoskeletal: Negative for myalgias.       Past Medical History  Diagnosis Date  . Chest pain   . Epigastric pain   . Hypertension   . Hyperglycemia     mild  . Allergy     allergic rhinitis  . Back pain   . Goiter   . History of tobacco abuse   . Panic disorder     History of  . Insomnia     Hx of  . Hyperlipidemia   . Depression   . Anxiety   . Labile blood pressure   . SVD (spontaneous vaginal delivery)     x 2     Social History   Social History  . Marital Status: Married    Spouse Name: N/A  . Number of Children: N/A  . Years of Education: N/A   Occupational History  . Not on file.   Social History Main Topics  . Smoking status: Former Smoker -- 1.00 packs/day for 20 years    Types: Cigarettes    Quit date: 12/11/2008  . Smokeless tobacco: Never Used  . Alcohol Use: No  . Drug Use: No  . Sexual Activity: Yes    Birth Control/ Protection: Post-menopausal   Other Topics Concern  . Not on file   Social History Narrative    Past Surgical History  Procedure Laterality Date  . Elbow surgery  2004  .  Thyroid surgery      B9 massess  . Wisdom tooth extraction    . Eye surgery      bilateral lasik  . Colonoscopy    . Abdominal hysterectomy N/A 09/24/2012    Procedure: HYSTERECTOMY ABDOMINAL;  Surgeon: Gus Height, MD;  Location: [facility];  Service: Gynecology;  Laterality: N/A;  . Salpingoophorectomy Bilateral 09/24/2012    Procedure: SALPINGO OOPHORECTOMY;  Surgeon: Gus Height, MD;  Location: [facility];  Service: Gynecology;  Laterality: Bilateral;  . Wart fulguration N/A 09/24/2012    Procedure: FULGURATION VAGINAL WART;  Surgeon: Gus Height, MD;  Location: [facility];  Service: Gynecology;  Laterality: N/A;    Family History  Problem Relation Age of Onset  . Hypertension Mother   . Stroke Mother   . Alcohol abuse Father   . Cancer Father     lung CA  . Heart disease Father     CHF  . Cancer Sister     breast CA  . Heart disease Sister     CHF from chemotx also has defib    Allergies  Allergen Reactions  . Codeine Nausea And Vomiting    REACTION: Nausea and vomiting Pt has tolerated  vicodin & percocet in the past  . Lipitor [Atorvastatin]     Muscle pain   . Wellbutrin [Bupropion]     Current Outpatient Prescriptions on File Prior to Visit  Medication Sig Dispense Refill  . aspirin 81 MG tablet Take 81 mg by mouth daily.    Marland Kitchen lisinopril-hydrochlorothiazide (PRINZIDE,ZESTORETIC) 10-12.5 MG per tablet Take 1 tablet by mouth daily. 90 tablet 3  . Multiple Vitamin (MULTIVITAMIN) tablet Take 1 tablet by mouth daily. Reported on 06/09/2015    . NASONEX 50 MCG/ACT nasal spray PLACE 2 SPRAYS INTO THE NOSE DAILY. 17 g 5  . Omega-3 Fatty Acids (FISH OIL PO) Take 1 capsule by mouth daily.    Marland Kitchen PARoxetine (PAXIL) 40 MG tablet Take 1 1/2 pills by mouth once daily 45 tablet 11  . rosuvastatin (CRESTOR) 10 MG tablet TAKE 1 TABLET (10 MG TOTAL) BY MOUTH DAILY. 30 tablet 11  . zolpidem (AMBIEN) 10 MG tablet TAKE 1 TABLET BY MOUTH AT BEDTIME 30 tablet 3  . ALPRAZolam (XANAX) 0.5 MG tablet Take 1  tablet (0.5 mg total) by mouth daily as needed for anxiety. (Patient not taking: Reported on 06/09/2015) 30 tablet 3   No current facility-administered medications on file prior to visit.    BP 118/76 mmHg  Pulse 110  Temp(Src) 98 F (36.7 C) (Oral)  Ht 5\' 4"  (1.626 m)  Wt 191 lb (86.637 kg)  BMI 32.77 kg/m2  SpO2 98%    Objective:   Physical Exam  Constitutional: She appears well-nourished. She appears ill.  HENT:  Right Ear: Tympanic membrane and ear canal normal.  Left Ear: Tympanic membrane and ear canal normal.  Nose: Right sinus exhibits no maxillary sinus tenderness and no frontal sinus tenderness. Left sinus exhibits no maxillary sinus tenderness and no frontal sinus tenderness.  Mouth/Throat: Oropharynx is clear and moist.  Eyes: Conjunctivae are normal.  Neck: Neck supple.  Cardiovascular: Normal rate and regular rhythm.   Pulmonary/Chest: Effort normal. She has no wheezes. She has rhonchi in the left upper field and the left lower field. She has no rales.  Crackles to left lower lobe.  Lymphadenopathy:    She has no cervical adenopathy.  Skin: Skin is warm and dry.          Assessment & Plan:  URI:  Cough, fatigue, chills, SOB, wheezing x 4 days while at the beach. Some improvement with hycodan from last years RX. Exam today with questionable lung sounds, rhonchi to LUL and LLL, as well as crackles to LLL. No wheezing. Appears ill. Chest xray negative. Given lung sounds and presentation will treat. RX for Zpak, albuterol inhaler, and Hycodan sent to pharmacy. Fluids, rest, return precautions provided.

## 2015-07-01 ENCOUNTER — Ambulatory Visit: Payer: Self-pay | Admitting: Primary Care

## 2015-07-07 ENCOUNTER — Ambulatory Visit: Payer: Medicare Other | Admitting: Family Medicine

## 2015-07-13 ENCOUNTER — Encounter: Payer: Self-pay | Admitting: Radiology

## 2015-07-13 ENCOUNTER — Encounter: Payer: Self-pay | Admitting: Family Medicine

## 2015-07-13 ENCOUNTER — Ambulatory Visit (INDEPENDENT_AMBULATORY_CARE_PROVIDER_SITE_OTHER): Payer: Medicare Other | Admitting: Family Medicine

## 2015-07-13 VITALS — BP 116/68 | HR 115 | Temp 97.9°F | Ht 64.0 in | Wt 188.5 lb

## 2015-07-13 DIAGNOSIS — F418 Other specified anxiety disorders: Secondary | ICD-10-CM | POA: Diagnosis not present

## 2015-07-13 DIAGNOSIS — Z79899 Other long term (current) drug therapy: Secondary | ICD-10-CM | POA: Diagnosis not present

## 2015-07-13 NOTE — Assessment & Plan Note (Signed)
Doing much better with 60 mg of paxil  She did poorly off of it and thinks she needs it from now on/ I agree Reviewed stressors/ coping techniques/symptoms/ support sources/ tx options and side effects in detail today  Mood and outlook are better No counseling but good support  Disc grief and how she deals with it  Commended on coping skills Continue paxil 60 Xanax prn (does not take often-none for quite a while) And ambien for sleep (daily)-tox screen today Update if any problems

## 2015-07-13 NOTE — Progress Notes (Signed)
Subjective:    Patient ID: Anita Harrell, female    DOB: Oct 23, 1950, 65 y.o.   MRN: [id number]  HPI Here for f/u of depression and anxiety  Last visit re started paxil with intent of titrating to 60 mg daily  It is helping a lot  Stayed on the 40 mg for 1 1/2 weeks and then titrated up to 60 mg and doing better   No longer sad and hopelessness No more pit in her stomach  Still grieving sisters and still cries occ Learning to live with the new normal  She functions  Her waves of tears come and go but do not last long   Talks to her husband for support    Has xanax - last took quite a while ago  Also ambien for sleep  30 with 3 ref in feb- takes every night   Also had a visit here for uri and saw Anda Kraft in the interim Did a cxr -ok Feeling much better   Patient Active Problem List   Diagnosis Date Noted  . Generalized anxiety disorder 08/21/2014  . Weight gain 11/10/2013  . Pain of left side of body 02/21/2013  . Grief reaction 11/26/2012  . Microscopic hematuria 08/13/2012  . Routine general medical examination at a health care facility 07/22/2012  . LEUKOCYTOSIS 03/30/2010  . Hyperglycemia 02/21/2008  . HYPERTENSION, BENIGN ESSENTIAL 01/22/2008  . ALLERGIC RHINITIS 07/25/2007  . BACK PAIN 06/26/2007  . Personal history of goiter 07/27/2006  . Hyperlipidemia 07/27/2006  . ANXIETY 07/27/2006  . PANIC DISORDER 07/27/2006  . Former smoker 07/27/2006  . Depression with anxiety 07/27/2006  . INSOMNIA, HX OF 07/27/2006   Past Medical History  Diagnosis Date  . Chest pain   . Epigastric pain   . Hypertension   . Hyperglycemia     mild  . Allergy     allergic rhinitis  . Back pain   . Goiter   . History of tobacco abuse   . Panic disorder     History of  . Insomnia     Hx of  . Hyperlipidemia   . Depression   . Anxiety   . Labile blood pressure   . SVD (spontaneous vaginal delivery)     x 2   Past Surgical History  Procedure Laterality Date  .  Elbow surgery  2004  . Thyroid surgery      B9 massess  . Wisdom tooth extraction    . Eye surgery      bilateral lasik  . Colonoscopy    . Abdominal hysterectomy N/A 09/24/2012    Procedure: HYSTERECTOMY ABDOMINAL;  Surgeon: Gus Height, MD;  Location: [facility];  Service: Gynecology;  Laterality: N/A;  . Salpingoophorectomy Bilateral 09/24/2012    Procedure: SALPINGO OOPHORECTOMY;  Surgeon: Gus Height, MD;  Location: [facility];  Service: Gynecology;  Laterality: Bilateral;  . Wart fulguration N/A 09/24/2012    Procedure: FULGURATION VAGINAL WART;  Surgeon: Gus Height, MD;  Location: [facility];  Service: Gynecology;  Laterality: N/A;   Social History  Substance Use Topics  . Smoking status: Former Smoker -- 1.00 packs/day for 20 years    Types: Cigarettes    Quit date: 12/11/2008  . Smokeless tobacco: Never Used  . Alcohol Use: No   Family History  Problem Relation Age of Onset  . Hypertension Mother   . Stroke Mother   . Alcohol abuse Father   . Cancer Father     lung CA  .  Heart disease Father     CHF  . Cancer Sister     breast CA  . Heart disease Sister     CHF from chemotx also has defib   Allergies  Allergen Reactions  . Codeine Nausea And Vomiting    REACTION: Nausea and vomiting Pt has tolerated vicodin & percocet in the past  . Lipitor [Atorvastatin]     Muscle pain   . Wellbutrin [Bupropion]    Current Outpatient Prescriptions on File Prior to Visit  Medication Sig Dispense Refill  . albuterol (PROVENTIL HFA;VENTOLIN HFA) 108 (90 Base) MCG/ACT inhaler Inhale 2 puffs into the lungs every 6 (six) hours as needed for wheezing or shortness of breath. 1 Inhaler 0  . ALPRAZolam (XANAX) 0.5 MG tablet Take 1 tablet (0.5 mg total) by mouth daily as needed for anxiety. 30 tablet 3  . aspirin 81 MG tablet Take 81 mg by mouth daily.    Marland Kitchen lisinopril-hydrochlorothiazide (PRINZIDE,ZESTORETIC) 10-12.5 MG per tablet Take 1 tablet by mouth daily. 90 tablet 3  . Multiple Vitamin  (MULTIVITAMIN) tablet Take 1 tablet by mouth daily. Reported on 06/09/2015    . NASONEX 50 MCG/ACT nasal spray PLACE 2 SPRAYS INTO THE NOSE DAILY. 17 g 5  . Omega-3 Fatty Acids (FISH OIL PO) Take 1 capsule by mouth daily.    Marland Kitchen PARoxetine (PAXIL) 40 MG tablet Take 1 1/2 pills by mouth once daily 45 tablet 11  . rosuvastatin (CRESTOR) 10 MG tablet TAKE 1 TABLET (10 MG TOTAL) BY MOUTH DAILY. 30 tablet 11  . zolpidem (AMBIEN) 10 MG tablet TAKE 1 TABLET BY MOUTH AT BEDTIME 30 tablet 3   No current facility-administered medications on file prior to visit.     Review of Systems Review of Systems  Constitutional: Negative for fever, appetite change, fatigue and unexpected weight change.  Eyes: Negative for pain and visual disturbance.  Respiratory: Negative for cough and shortness of breath.   Cardiovascular: Negative for cp or palpitations    Gastrointestinal: Negative for nausea, diarrhea and constipation.  Genitourinary: Negative for urgency and frequency.  Skin: Negative for pallor or rash   Neurological: Negative for weakness, light-headedness, numbness and headaches.  Hematological: Negative for adenopathy. Does not bruise/bleed easily.  Psychiatric/Behavioral: pos for anxiety and depression that are improved        Objective:   Physical Exam  Constitutional: She appears well-developed and well-nourished. No distress.  obese and well appearing   Eyes: Conjunctivae and EOM are normal. Pupils are equal, round, and reactive to light.  Neck: Normal range of motion. Neck supple. No JVD present. No thyromegaly present.  Cardiovascular: Normal rate and normal heart sounds.   Pulmonary/Chest: Effort normal and breath sounds normal. No respiratory distress. She has no wheezes. She has no rales.  Musculoskeletal: She exhibits no edema.  Lymphadenopathy:    She has no cervical adenopathy.  Neurological: She is alert. She has normal reflexes. No cranial nerve deficit. She exhibits normal muscle  tone. Coordination normal.  No tremor  Skin: Skin is warm and dry. No rash noted. No pallor.  Psychiatric: She has a normal mood and affect. Her speech is normal and behavior is normal. Thought content normal. Her mood appears not anxious. Her affect is not blunt and not labile. Cognition and memory are normal. She does not exhibit a depressed mood.  Much improved affect  Not tearful or anxious today          Assessment & Plan:   Problem  List Items Addressed This Visit      Other   Depression with anxiety - Primary    Doing much better with 60 mg of paxil  She did poorly off of it and thinks she needs it from now on/ I agree Reviewed stressors/ coping techniques/symptoms/ support sources/ tx options and side effects in detail today  Mood and outlook are better No counseling but good support  Disc grief and how she deals with it  Commended on coping skills Continue paxil 60 Xanax prn (does not take often-none for quite a while) And ambien for sleep (daily)-tox screen today Update if any problems

## 2015-07-13 NOTE — Progress Notes (Signed)
Pre visit review using our clinic review tool, if applicable. No additional management support is needed unless otherwise documented below in the visit note. 

## 2015-07-13 NOTE — Patient Instructions (Addendum)
Schedule appt with Katha Cabal for medicare questions and labs and then follow up with me for a annual exam (reviewing chronic medical problems)  So glad you are feeling better ! Stay on the same medications  Keep talking to your husband Stay active

## 2015-07-26 ENCOUNTER — Encounter: Payer: Self-pay | Admitting: Family Medicine

## 2015-08-22 ENCOUNTER — Telehealth: Payer: Self-pay | Admitting: Family Medicine

## 2015-08-22 DIAGNOSIS — R739 Hyperglycemia, unspecified: Secondary | ICD-10-CM

## 2015-08-22 DIAGNOSIS — E785 Hyperlipidemia, unspecified: Secondary | ICD-10-CM

## 2015-08-22 DIAGNOSIS — I10 Essential (primary) hypertension: Secondary | ICD-10-CM

## 2015-08-22 NOTE — Telephone Encounter (Signed)
-----   Message from Ellamae Sia sent at 08/20/2015  3:22 PM EDT ----- Regarding: Lab orders for Monday, 6.19.17  AWV lab orders, please.

## 2015-08-25 ENCOUNTER — Other Ambulatory Visit: Payer: Self-pay | Admitting: Family Medicine

## 2015-08-25 NOTE — Telephone Encounter (Signed)
Pt has f/u on 11/01/15, last filled on 05/03/15 #30 with 3 additional refills, please advise

## 2015-08-25 NOTE — Telephone Encounter (Signed)
Px written for call in   

## 2015-08-26 NOTE — Telephone Encounter (Signed)
Rx called in as prescribed 

## 2015-08-30 ENCOUNTER — Other Ambulatory Visit (INDEPENDENT_AMBULATORY_CARE_PROVIDER_SITE_OTHER): Payer: Medicare Other

## 2015-08-30 ENCOUNTER — Ambulatory Visit (INDEPENDENT_AMBULATORY_CARE_PROVIDER_SITE_OTHER): Payer: Medicare Other

## 2015-08-30 ENCOUNTER — Encounter: Payer: Self-pay | Admitting: Internal Medicine

## 2015-08-30 VITALS — BP 114/82 | HR 103 | Temp 98.6°F | Ht 63.5 in | Wt 189.0 lb

## 2015-08-30 DIAGNOSIS — Z0000 Encounter for general adult medical examination without abnormal findings: Secondary | ICD-10-CM | POA: Diagnosis not present

## 2015-08-30 DIAGNOSIS — Z1159 Encounter for screening for other viral diseases: Secondary | ICD-10-CM | POA: Diagnosis not present

## 2015-08-30 DIAGNOSIS — Z23 Encounter for immunization: Secondary | ICD-10-CM

## 2015-08-30 DIAGNOSIS — E785 Hyperlipidemia, unspecified: Secondary | ICD-10-CM | POA: Diagnosis not present

## 2015-08-30 DIAGNOSIS — Z8719 Personal history of other diseases of the digestive system: Secondary | ICD-10-CM

## 2015-08-30 DIAGNOSIS — K5730 Diverticulosis of large intestine without perforation or abscess without bleeding: Secondary | ICD-10-CM

## 2015-08-30 DIAGNOSIS — E2839 Other primary ovarian failure: Secondary | ICD-10-CM

## 2015-08-30 DIAGNOSIS — Z1211 Encounter for screening for malignant neoplasm of colon: Secondary | ICD-10-CM

## 2015-08-30 DIAGNOSIS — I10 Essential (primary) hypertension: Secondary | ICD-10-CM

## 2015-08-30 DIAGNOSIS — R739 Hyperglycemia, unspecified: Secondary | ICD-10-CM | POA: Diagnosis not present

## 2015-08-30 LAB — CBC WITH DIFFERENTIAL/PLATELET
Basophils Absolute: 0 10*3/uL (ref 0.0–0.1)
Basophils Relative: 0.5 % (ref 0.0–3.0)
Eosinophils Absolute: 0 10*3/uL (ref 0.0–0.7)
Eosinophils Relative: 0.4 % (ref 0.0–5.0)
HEMATOCRIT: 39.8 % (ref 36.0–46.0)
HEMOGLOBIN: 13.6 g/dL (ref 12.0–15.0)
LYMPHS PCT: 27.1 % (ref 12.0–46.0)
Lymphs Abs: 2.5 10*3/uL (ref 0.7–4.0)
MCHC: 34.2 g/dL (ref 30.0–36.0)
MCV: 87.1 fl (ref 78.0–100.0)
Monocytes Absolute: 1.7 10*3/uL — ABNORMAL HIGH (ref 0.1–1.0)
Neutro Abs: 4.8 10*3/uL (ref 1.4–7.7)
Neutrophils Relative %: 52.8 % (ref 43.0–77.0)
Platelets: 184 10*3/uL (ref 150.0–400.0)
RBC: 4.56 Mil/uL (ref 3.87–5.11)
RDW: 13.6 % (ref 11.5–15.5)
WBC: 9.1 10*3/uL (ref 4.0–10.5)

## 2015-08-30 LAB — COMPREHENSIVE METABOLIC PANEL
ALBUMIN: 4.7 g/dL (ref 3.5–5.2)
ALK PHOS: 52 U/L (ref 39–117)
ALT: 16 U/L (ref 0–35)
AST: 18 U/L (ref 0–37)
BUN: 12 mg/dL (ref 6–23)
CHLORIDE: 102 meq/L (ref 96–112)
CO2: 25 mEq/L (ref 19–32)
CREATININE: 0.58 mg/dL (ref 0.40–1.20)
Calcium: 10.1 mg/dL (ref 8.4–10.5)
GFR: 110.38 mL/min (ref >60.00)
GLUCOSE: 122 mg/dL — AB (ref 70–99)
Potassium: 3.9 mEq/L (ref 3.5–5.1)
SODIUM: 140 meq/L (ref 135–145)
TOTAL PROTEIN: 7.9 g/dL (ref 6.0–8.3)
Total Bilirubin: 0.6 mg/dL (ref 0.2–1.2)

## 2015-08-30 LAB — LIPID PANEL
CHOL/HDL RATIO: 3
Cholesterol: 131 mg/dL (ref 0–200)
HDL: 49 mg/dL (ref >39.00)
LDL Cholesterol: 57 mg/dL (ref 0–99)
NONHDL: 82.39
Triglycerides: 128 mg/dL (ref 0.0–149.0)
VLDL: 25.6 mg/dL (ref 0.0–40.0)

## 2015-08-30 LAB — TSH: TSH: 1.92 u[IU]/mL (ref 0.35–4.50)

## 2015-08-30 LAB — HEMOGLOBIN A1C: HEMOGLOBIN A1C: 6 % (ref 4.6–6.5)

## 2015-08-30 NOTE — Progress Notes (Signed)
PCP notes:  Health maintenance:   Bone density - order/referral generated Colonoscopy - order/referral generated Hep C screening - completed  PPSV23 - administered  Abnormal screenings: None  Patient concerns: None  Nurse concerns: None  Next PCP appt: 11/01/15 @ 1400

## 2015-08-30 NOTE — Progress Notes (Signed)
Subjective:   Anita Harrell is a 65 y.o. female who presents for an Initial Medicare Annual Wellness Visit.  Review of Systems    N/A  Cardiac Risk Factors include: advanced age (>39men, >25 women);hypertension;obesity (BMI >30kg/m2)     Objective:    Today's Vitals   08/30/15 1340  BP: 114/82  Pulse: 103  Temp: 98.6 F (37 C)  TempSrc: Oral  Height: 5' 3.5" (1.613 m)  Weight: 189 lb (85.73 kg)  SpO2: 96%  PainSc: 0-No pain   Body mass index is 32.95 kg/(m^2).   Current Medications (verified) Outpatient Encounter Prescriptions as of 08/30/2015  Medication Sig  . albuterol (PROVENTIL HFA;VENTOLIN HFA) 108 (90 Base) MCG/ACT inhaler Inhale 2 puffs into the lungs every 6 (six) hours as needed for wheezing or shortness of breath.  . ALPRAZolam (XANAX) 0.5 MG tablet Take 1 tablet (0.5 mg total) by mouth daily as needed for anxiety.  Marland Kitchen aspirin 81 MG tablet Take 81 mg by mouth daily.  Marland Kitchen lisinopril-hydrochlorothiazide (PRINZIDE,ZESTORETIC) 10-12.5 MG per tablet Take 1 tablet by mouth daily.  Marland Kitchen NASONEX 50 MCG/ACT nasal spray PLACE 2 SPRAYS INTO THE NOSE DAILY. (Patient taking differently: PLACE 2 SPRAYS INTO THE NOSE DAILY as needed)  . Omega-3 Fatty Acids (FISH OIL PO) Take 1 capsule by mouth daily.  Marland Kitchen PARoxetine (PAXIL) 40 MG tablet Take 1 1/2 pills by mouth once daily  . rosuvastatin (CRESTOR) 10 MG tablet TAKE 1 TABLET (10 MG TOTAL) BY MOUTH DAILY.  Marland Kitchen zolpidem (AMBIEN) 10 MG tablet TAKE 1 TABLET BY MOUTH AT BEDTIME  . [DISCONTINUED] Multiple Vitamin (MULTIVITAMIN) tablet Take 1 tablet by mouth daily. Reported on 06/09/2015   No facility-administered encounter medications on file as of 08/30/2015.    Allergies (verified) Codeine; Lipitor; and Wellbutrin   History: Past Medical History  Diagnosis Date  . Chest pain   . Epigastric pain   . Hypertension   . Hyperglycemia     mild  . Allergy     allergic rhinitis  . Back pain   . Goiter   . History of tobacco abuse    . Panic disorder     History of  . Insomnia     Hx of  . Hyperlipidemia   . Depression   . Anxiety   . Labile blood pressure   . SVD (spontaneous vaginal delivery)     x 2   Past Surgical History  Procedure Laterality Date  . Elbow surgery  2004  . Thyroid surgery      B9 massess  . Wisdom tooth extraction    . Eye surgery      bilateral lasik  . Colonoscopy    . Abdominal hysterectomy N/A 09/24/2012    Procedure: HYSTERECTOMY ABDOMINAL;  Surgeon: Gus Height, MD;  Location: [facility];  Service: Gynecology;  Laterality: N/A;  . Salpingoophorectomy Bilateral 09/24/2012    Procedure: SALPINGO OOPHORECTOMY;  Surgeon: Gus Height, MD;  Location: [facility];  Service: Gynecology;  Laterality: Bilateral;  . Wart fulguration N/A 09/24/2012    Procedure: FULGURATION VAGINAL WART;  Surgeon: Gus Height, MD;  Location: [facility];  Service: Gynecology;  Laterality: N/A;   Family History  Problem Relation Age of Onset  . Hypertension Mother   . Stroke Mother   . Alcohol abuse Father   . Cancer Father     lung CA  . Heart disease Father     CHF  . Cancer Sister     breast CA  .  Heart disease Sister     CHF from chemotx also has defib   Social History   Occupational History  . Not on file.   Social History Main Topics  . Smoking status: Former Smoker -- 1.00 packs/day for 20 years    Types: Cigarettes    Quit date: 12/11/2008  . Smokeless tobacco: Never Used  . Alcohol Use: No  . Drug Use: No  . Sexual Activity: Yes    Birth Control/ Protection: Post-menopausal    Tobacco Counseling Counseling given: No   Activities of Daily Living In your present state of health, do you have any difficulty performing the following activities: 08/30/2015  Hearing? N  Vision? N  Difficulty concentrating or making decisions? N  Walking or climbing stairs? N  Dressing or bathing? N  Doing errands, shopping? N  Preparing Food and eating ? N  Using the Toilet? N  In the past six months, have you  accidently leaked urine? N  Do you have problems with loss of bowel control? N  Managing your Medications? N  Managing your Finances? N  Housekeeping or managing your Housekeeping? N    Immunizations and Health Maintenance Immunization History  Administered Date(s) Administered  . Influenza Split 03/27/2011, 01/23/2012  . Influenza,inj,Quad PF,36+ Mos 11/26/2012, 11/10/2013  . Pneumococcal Conjugate-13 08/21/2014  . Pneumococcal Polysaccharide-23 05/19/2008, 08/30/2015  . Td 03/13/2001  . Tdap 01/23/2012  . Zoster 01/07/2013   There are no preventive care reminders to display for this patient.  Patient Care Team: Abner Greenspan, MD as PCP - General Vania Rea, MD as Consulting Physician (Obstetrics and Gynecology)    Assessment:   This is a routine wellness examination for Anita Harrell.   Hearing/Vision screen  Hearing Screening   125Hz  250Hz  500Hz  1000Hz  2000Hz  4000Hz  8000Hz   Right ear:   40 40 40 40   Left ear:   40 40 40 40     Visual Acuity Screening   Right eye Left eye Both eyes  Without correction: 20/20-1 20/20-1 20/20-1  With correction:       Dietary issues and exercise activities discussed: Current Exercise Habits: Home exercise routine, Type of exercise: walking, Time (Minutes): 30, Frequency (Times/Week): 3, Weekly Exercise (Minutes/Week): 90, Intensity: Moderate, Exercise limited by: None identified  Goals    . Increase physical activity     Starting 08/30/2015, I will continue to exercise for 30 min 3 days week.       Depression Screen PHQ 2/9 Scores 08/30/2015 06/09/2015 07/24/2013 01/23/2012  PHQ - 2 Score 0 6 1 0  PHQ- 9 Score - 13 - -    Fall Risk Fall Risk  08/30/2015 06/09/2015  Falls in the past year? No No    Cognitive Function: MMSE - Mini Mental State Exam 08/30/2015  Orientation to time 5  Orientation to Place 5  Registration 3  Attention/ Calculation 0  Recall 3  Language- name 2 objects 0  Language- repeat 1  Language- follow 3 step  command 3  Language- read & follow direction 0  Write a sentence 0  Copy design 0  Total score 20   PLEASE NOTE: A Mini-Cog screen was completed. Maximum score is 20. A value of 0 denotes this part of [facility]stein MMSE was not completed or the patient failed this part of the Mini-Cog screening.   Mini-Cog Screening Orientation to Time - Max 5 pts Orientation to Place - Max 5 pts Registration - Max 3 pts Recall - Max 3 pts  Language Repeat - Max 1 pts Language Follow 3 Step Command - Max 3 pts  Screening Tests Health Maintenance  Topic Date Due  . DEXA SCAN  03/12/2016 (Originally 03/04/2014)  . COLONOSCOPY  03/12/2016 (Originally 06/10/2014)  . INFLUENZA VACCINE  10/12/2015  . MAMMOGRAM  05/27/2017  . TETANUS/TDAP  01/22/2022  . ZOSTAVAX  Completed  . Hepatitis C Screening  Completed  . PNA vac Low Risk Adult  Completed      Plan:     I have personally reviewed and addressed the Medicare Annual Wellness questionnaire and have noted the following in the patient's chart:  A. Medical and social history B. Use of alcohol, tobacco or illicit drugs  C. Current medications and supplements D. Functional ability and status E.  Nutritional status F.  Physical activity G. Advance directives H. List of other physicians I.  Hospitalizations, surgeries, and ER visits in previous 12 months J.  [place] to include hearing, vision, cognitive, depression L. Referrals and appointments - none  In addition, I have reviewed and discussed with patient certain preventive protocols, quality metrics, and best practice recommendations. A written personalized care plan for preventive services as well as general preventive health recommendations were provided to patient.  See attached scanned questionnaire for additional information.   Signed,   Lindell Noe, MHA, BS, LPN Health Advisor

## 2015-08-30 NOTE — Patient Instructions (Signed)
Anita Harrell , Thank you for taking time to come for your Medicare Wellness Visit. I appreciate your ongoing commitment to your health goals. Please review the following plan we discussed and let me know if I can assist you in the future.   These are the goals we discussed: Goals    . Increase physical activity     Starting 08/30/2015, I will continue to exercise for 30 min 3 days week.        This is a list of the screening recommended for you and due dates:  Health Maintenance  Topic Date Due  . DEXA scan (bone density measurement)  03/12/2016*  . Colon Cancer Screening  03/12/2016*  . Flu Shot  10/12/2015  . Mammogram  05/27/2017  . Tetanus Vaccine  01/22/2022  . Shingles Vaccine  Completed  .  Hepatitis C: One time screening is recommended by Center for Disease Control  (CDC) for  adults born from [birth year] through 1967.   Completed  . Pneumonia vaccines  Completed  *Topic was postponed. The date shown is not the original due date.    Preventive Care for Adults  A healthy lifestyle and preventive care can promote health and wellness. Preventive health guidelines for adults include the following key practices.  . A routine yearly physical is a good way to check with your health care provider about your health and preventive screening. It is a chance to share any concerns and updates on your health and to receive a thorough exam.  . Visit your dentist for a routine exam and preventive care every 6 months. Brush your teeth twice a day and floss once a day. Good oral hygiene prevents tooth decay and gum disease.  . The frequency of eye exams is based on your age, health, family medical history, use  of contact lenses, and other factors. Follow your health care provider's ecommendations for frequency of eye exams.  . Eat a healthy diet. Foods like vegetables, fruits, whole grains, low-fat dairy products, and lean protein foods contain the nutrients you need without too many calories.  Decrease your intake of foods high in solid fats, added sugars, and salt. Eat the right amount of calories for you. Get information about a proper diet from your health care provider, if necessary.  . Regular physical exercise is one of the most important things you can do for your health. Most adults should get at least 150 minutes of moderate-intensity exercise (any activity that increases your heart rate and causes you to sweat) each week. In addition, most adults need muscle-strengthening exercises on 2 or more days a week.  Silver Sneakers may be a benefit available to you. To determine eligibility, you may visit the website: www.silversneakers.com or contact program at (561) 057-7846 Mon-Fri between 8AM-8PM.   . Maintain a healthy weight. The body mass index (BMI) is a screening tool to identify possible weight problems. It provides an estimate of body fat based on height and weight. Your health care provider can find your BMI and can help you achieve or maintain a healthy weight.   For adults 20 years and older: ? A BMI below 18.5 is considered underweight. ? A BMI of 18.5 to 24.9 is normal. ? A BMI of 25 to 29.9 is considered overweight. ? A BMI of 30 and above is considered obese.   . Maintain normal blood lipids and cholesterol levels by exercising and minimizing your intake of saturated fat. Eat a balanced diet with plenty of fruit  and vegetables. Blood tests for lipids and cholesterol should begin at age 43 and be repeated every 5 years. If your lipid or cholesterol levels are high, you are over 50, or you are at high risk for heart disease, you may need your cholesterol levels checked more frequently. Ongoing high lipid and cholesterol levels should be treated with medicines if diet and exercise are not working.  . If you smoke, find out from your health care provider how to quit. If you do not use tobacco, please do not start.  . If you choose to drink alcohol, please do not consume  more than 2 drinks per day. One drink is considered to be 12 ounces (355 mL) of beer, 5 ounces (148 mL) of wine, or 1.5 ounces (44 mL) of liquor.  . If you are 62-73 years old, ask your health care provider if you should take aspirin to prevent strokes.  . Use sunscreen. Apply sunscreen liberally and repeatedly throughout the day. You should seek shade when your shadow is shorter than you. Protect yourself by wearing long sleeves, pants, a wide-brimmed hat, and sunglasses year round, whenever you are outdoors.  . Once a month, do a whole body skin exam, using a mirror to look at the skin on your back. Tell your health care provider of new moles, moles that have irregular borders, moles that are larger than a pencil eraser, or moles that have changed in shape or color.

## 2015-08-30 NOTE — Progress Notes (Signed)
Pre visit review using our clinic review tool, if applicable. No additional management support is needed unless otherwise documented below in the visit note. 

## 2015-08-31 ENCOUNTER — Encounter: Payer: Self-pay | Admitting: *Deleted

## 2015-08-31 LAB — HEPATITIS C ANTIBODY: HCV AB: NEGATIVE

## 2015-08-31 NOTE — Progress Notes (Signed)
I reviewed health advisor's note, was available for consultation, and agree with documentation and plan.  

## 2015-09-23 ENCOUNTER — Other Ambulatory Visit: Payer: Self-pay | Admitting: Family Medicine

## 2015-09-24 NOTE — Telephone Encounter (Signed)
Pt has f/u scheduled on 11/01/15, last filled on 08/25/15 #30 with 0 refills, please advise

## 2015-09-24 NOTE — Telephone Encounter (Signed)
Px written for call in   

## 2015-09-24 NOTE — Telephone Encounter (Signed)
Rx called in as prescribed 

## 2015-09-30 ENCOUNTER — Ambulatory Visit: Payer: Medicare Other

## 2015-10-24 ENCOUNTER — Other Ambulatory Visit: Payer: Self-pay | Admitting: Family Medicine

## 2015-10-29 ENCOUNTER — Ambulatory Visit (AMBULATORY_SURGERY_CENTER): Payer: Self-pay | Admitting: *Deleted

## 2015-10-29 VITALS — Ht 65.0 in | Wt 192.0 lb

## 2015-10-29 DIAGNOSIS — Z86010 Personal history of colonic polyps: Secondary | ICD-10-CM

## 2015-10-29 NOTE — Progress Notes (Signed)
Denies allergies to eggs or soy products. Denies complications with sedation or anesthesia. Denies O2 use. Denies use of diet or weight loss medications.  Emmi instructions given for colonoscopy.  

## 2015-11-01 ENCOUNTER — Ambulatory Visit (INDEPENDENT_AMBULATORY_CARE_PROVIDER_SITE_OTHER): Payer: Medicare Other | Admitting: Family Medicine

## 2015-11-01 ENCOUNTER — Encounter: Payer: Self-pay | Admitting: Family Medicine

## 2015-11-01 VITALS — BP 118/66 | HR 100 | Temp 98.4°F | Ht 64.0 in | Wt 192.2 lb

## 2015-11-01 DIAGNOSIS — R739 Hyperglycemia, unspecified: Secondary | ICD-10-CM

## 2015-11-01 DIAGNOSIS — R000 Tachycardia, unspecified: Secondary | ICD-10-CM | POA: Insufficient documentation

## 2015-11-01 DIAGNOSIS — I10 Essential (primary) hypertension: Secondary | ICD-10-CM | POA: Diagnosis not present

## 2015-11-01 DIAGNOSIS — E785 Hyperlipidemia, unspecified: Secondary | ICD-10-CM

## 2015-11-01 MED ORDER — LISINOPRIL-HYDROCHLOROTHIAZIDE 10-12.5 MG PO TABS: 1.0000 | ORAL_TABLET | Freq: Every day | ORAL | 3 refills | Status: DC

## 2015-11-01 NOTE — Assessment & Plan Note (Signed)
bp in fair control at this time  BP Readings from Last 1 Encounters:  11/01/15 118/66   No changes needed Disc lifstyle change with low sodium diet and exercise  Labs reviewed

## 2015-11-01 NOTE — Progress Notes (Signed)
Pre visit review using our clinic review tool, if applicable. No additional management support is needed unless otherwise documented below in the visit note. 

## 2015-11-01 NOTE — Assessment & Plan Note (Signed)
Likely 2ndary to anxiety  Urged pt to start checking pulse when relaxed at home Lab Results  Component Value Date   TSH 1.92 08/30/2015   also enc good hydration Will continue to watch  She is anxious for visits and admits so

## 2015-11-01 NOTE — Patient Instructions (Addendum)
Get your colonoscopy as planned  Re schedule dexa when you can  Watch sweets and carbohydrates (bread/pasta/rice/potatoes)- to prevent diabetes Also work on weight loss  Eat a healthy diet

## 2015-11-01 NOTE — Assessment & Plan Note (Signed)
Disc goals for lipids and reasons to control them Rev labs with pt Rev low sat fat diet in detail  Takes fish oil Overall well controlled

## 2015-11-01 NOTE — Progress Notes (Signed)
Subjective:    Patient ID: Anita Harrell, female    DOB: 1950/08/20, 65 y.o.   MRN: [id number]  HPI  Here for f/u of chronic medical problems   Doing fine  Nothing new   Had [facility] visit in June   Referred for dexa/colonoscopy Will have to re schedule dexa - she was sick and had to cancel  Has the colonoscopy next Friday  Hep C screening done -negative  PPSV23 done  No abn screening   Mammogram 3/17-neg Self breast exam - no breast lumps   Wt Readings from Last 3 Encounters:  11/01/15 192 lb 4 oz (87.2 kg)  10/29/15 192 lb (87.1 kg)  08/30/15 189 lb (85.7 kg)  up 3 lb from June  She has been eating more sweets/ craving them  bmi 33  Baseline high pulse rate  130 at presentation    bp is stable today  No cp or palpitations or headaches or edema  No side effects to medicines  BP Readings from Last 3 Encounters:  11/01/15 118/66  08/30/15 114/82  07/13/15 116/68     Hx of hyperglycemia Lab Results  Component Value Date   HGBA1C 6.0 08/30/2015   More sweets lately-needs to be more careful  Hx of hyperlipidemia On crestor Lab Results  Component Value Date   CHOL 131 08/30/2015   HDL 49.00 08/30/2015   LDLCALC 57 08/30/2015   LDLDIRECT 73.0 08/21/2014   TRIG 128.0 08/30/2015   CHOLHDL 3 08/30/2015  well controlled   Not a lot of exercise    Patient Active Problem List   Diagnosis Date Noted  . Generalized anxiety disorder 08/21/2014  . Weight gain 11/10/2013  . Pain of left side of body 02/21/2013  . Grief reaction 11/26/2012  . Microscopic hematuria 08/13/2012  . Routine general medical examination at a health care facility 07/22/2012  . LEUKOCYTOSIS 03/30/2010  . Hyperglycemia 02/21/2008  . HYPERTENSION, BENIGN ESSENTIAL 01/22/2008  . ALLERGIC RHINITIS 07/25/2007  . BACK PAIN 06/26/2007  . Personal history of goiter 07/27/2006  . Hyperlipidemia 07/27/2006  . ANXIETY 07/27/2006  . PANIC DISORDER 07/27/2006  . Former smoker 07/27/2006  .  Depression with anxiety 07/27/2006  . INSOMNIA, HX OF 07/27/2006   Past Medical History:  Diagnosis Date  . Allergy    allergic rhinitis  . Anxiety   . Back pain   . Chest pain   . Depression   . Epigastric pain   . Goiter   . History of tobacco abuse   . Hyperglycemia    mild  . Hyperlipidemia   . Hypertension   . Insomnia    Hx of  . Labile blood pressure   . Panic disorder    History of  . SVD (spontaneous vaginal delivery)    x 2   Past Surgical History:  Procedure Laterality Date  . ABDOMINAL HYSTERECTOMY N/A 09/24/2012   Procedure: HYSTERECTOMY ABDOMINAL;  Surgeon: Gus Height, MD;  Location: [facility];  Service: Gynecology;  Laterality: N/A;  . COLONOSCOPY    . ELBOW SURGERY  2004  . EYE SURGERY     bilateral lasik  . SALPINGOOPHORECTOMY Bilateral 09/24/2012   Procedure: SALPINGO OOPHORECTOMY;  Surgeon: Gus Height, MD;  Location: [facility];  Service: Gynecology;  Laterality: Bilateral;  . THYROID SURGERY     B9 massess  . WART FULGURATION N/A 09/24/2012   Procedure: FULGURATION VAGINAL WART;  Surgeon: Gus Height, MD;  Location: [facility];  Service: Gynecology;  Laterality: N/A;  .  WISDOM TOOTH EXTRACTION     Social History  Substance Use Topics  . Smoking status: Former Smoker    Packs/day: 1.00    Years: 20.00    Types: Cigarettes    Quit date: 12/11/2008  . Smokeless tobacco: Never Used  . Alcohol use No   Family History  Problem Relation Age of Onset  . Hypertension Mother   . Stroke Mother   . Alcohol abuse Father   . Cancer Father     lung CA  . Heart disease Father     CHF  . Cancer Sister     breast CA  . Heart disease Sister     CHF from chemotx also has defib  . Colon cancer Neg Hx   . Esophageal cancer Neg Hx   . Rectal cancer Neg Hx   . Stomach cancer Neg Hx    Allergies  Allergen Reactions  . Codeine Nausea And Vomiting    REACTION: Nausea and vomiting Pt has tolerated vicodin & percocet in the past  . Lipitor [Atorvastatin]     Muscle  pain   . Wellbutrin [Bupropion]    Current Outpatient Prescriptions on File Prior to Visit  Medication Sig Dispense Refill  . albuterol (PROVENTIL HFA;VENTOLIN HFA) 108 (90 Base) MCG/ACT inhaler Inhale 2 puffs into the lungs every 6 (six) hours as needed for wheezing or shortness of breath. 1 Inhaler 0  . ALPRAZolam (XANAX) 0.5 MG tablet Take 1 tablet (0.5 mg total) by mouth daily as needed for anxiety. 30 tablet 3  . aspirin 81 MG tablet Take 81 mg by mouth daily.    . bisacodyl (DULCOLAX) 5 MG EC tablet Take 5 mg by mouth once. One time use for colonoscopy    . lisinopril-hydrochlorothiazide (PRINZIDE,ZESTORETIC) 10-12.5 MG tablet TAKE 1 TABLET BY MOUTH DAILY. 90 tablet 0  . NASONEX 50 MCG/ACT nasal spray PLACE 2 SPRAYS INTO THE NOSE DAILY. (Patient taking differently: PLACE 2 SPRAYS INTO THE NOSE DAILY as needed) 17 g 5  . Omega-3 Fatty Acids (FISH OIL PO) Take 1 capsule by mouth daily.    Marland Kitchen PARoxetine (PAXIL) 40 MG tablet Take 1 1/2 pills by mouth once daily 45 tablet 11  . polyethylene glycol powder (MIRALAX) powder Take 1 Container by mouth once. One time use for colonoscopy    . rosuvastatin (CRESTOR) 10 MG tablet TAKE 1 TABLET (10 MG TOTAL) BY MOUTH DAILY. 30 tablet 5  . tolterodine (DETROL LA) 4 MG 24 hr capsule Take 4 mg by mouth daily.  11  . zolpidem (AMBIEN) 10 MG tablet TAKE 1 TABLET BY MOUTH AT BEDTIME 30 tablet 1   No current facility-administered medications on file prior to visit.     Review of Systems    Review of Systems  Constitutional: Negative for fever, appetite change, fatigue and unexpected weight change.  Eyes: Negative for pain and visual disturbance.  Respiratory: Negative for cough and shortness of breath.   Cardiovascular: Negative for cp or palpitations    Gastrointestinal: Negative for nausea, diarrhea and constipation.  Genitourinary: Negative for urgency and frequency.  Skin: Negative for pallor or rash   Neurological: Negative for weakness,  light-headedness, numbness and headaches.  Hematological: Negative for adenopathy. Does not bruise/bleed easily.  Psychiatric/Behavioral: Negative for dysphoric mood. The patient is often nervous/anxious.      Objective:   Physical Exam  Constitutional: She appears well-developed and well-nourished. No distress.  obese and well appearing   HENT:  Head: Normocephalic and  atraumatic.  Right Ear: External ear normal.  Left Ear: External ear normal.  Mouth/Throat: Oropharynx is clear and moist.  Eyes: Conjunctivae and EOM are normal. Pupils are equal, round, and reactive to light. No scleral icterus.  Neck: Normal range of motion. Neck supple. No JVD present. Carotid bruit is not present. No thyromegaly present.  Cardiovascular: Regular rhythm, normal heart sounds and intact distal pulses.  Exam reveals no gallop.   Mildly tachycardic  Pulmonary/Chest: Effort normal and breath sounds normal. No respiratory distress. She has no wheezes. She exhibits no tenderness.  Abdominal: Soft. Bowel sounds are normal. She exhibits no distension, no abdominal bruit and no mass. There is no tenderness.  Genitourinary: No breast swelling, tenderness, discharge or bleeding.  Genitourinary Comments: Breast exam: No mass, nodules, thickening, tenderness, bulging, retraction, inflamation, nipple discharge or skin changes noted.  No axillary or clavicular LA.      Musculoskeletal: Normal range of motion. She exhibits no edema or tenderness.  Lymphadenopathy:    She has no cervical adenopathy.  Neurological: She is alert. She has normal reflexes. No cranial nerve deficit. She exhibits normal muscle tone. Coordination normal.  Skin: Skin is warm and dry. No rash noted. No erythema. No pallor.  Many lentigines   Psychiatric: Her speech is normal and behavior is normal. Thought content normal. Her mood appears anxious. Her affect is not blunt, not labile and not inappropriate. She does not exhibit a depressed mood.    Baseline anxious - but pleasant and talkative           Assessment & Plan:   Problem List Items Addressed This Visit      Cardiovascular and Mediastinum   HYPERTENSION, BENIGN ESSENTIAL - Primary    bp in fair control at this time  BP Readings from Last 1 Encounters:  11/01/15 118/66   No changes needed Disc lifstyle change with low sodium diet and exercise  Labs reviewed       Relevant Medications   lisinopril-hydrochlorothiazide (PRINZIDE,ZESTORETIC) 10-12.5 MG tablet     Other   Tachycardia    Likely 2ndary to anxiety  Urged pt to start checking pulse when relaxed at home Lab Results  Component Value Date   TSH 1.92 08/30/2015   also enc good hydration Will continue to watch  She is anxious for visits and admits so       Hyperlipidemia    Disc goals for lipids and reasons to control them Rev labs with pt Rev low sat fat diet in detail  Takes fish oil Overall well controlled       Relevant Medications   lisinopril-hydrochlorothiazide (PRINZIDE,ZESTORETIC) 10-12.5 MG tablet   Hyperglycemia    Lab Results  Component Value Date   HGBA1C 6.0 08/30/2015   Rev need for wt loss and low glycemic diet to prevent DM2        Other Visit Diagnoses   None.

## 2015-11-01 NOTE — Assessment & Plan Note (Signed)
Lab Results  Component Value Date   HGBA1C 6.0 08/30/2015   Rev need for wt loss and low glycemic diet to prevent DM2

## 2015-11-12 ENCOUNTER — Ambulatory Visit (AMBULATORY_SURGERY_CENTER): Payer: Medicare Other | Admitting: Internal Medicine

## 2015-11-12 ENCOUNTER — Encounter: Payer: Self-pay | Admitting: Internal Medicine

## 2015-11-12 VITALS — BP 120/60 | HR 79 | Temp 98.0°F | Resp 10 | Ht 64.0 in | Wt 192.0 lb

## 2015-11-12 DIAGNOSIS — R000 Tachycardia, unspecified: Secondary | ICD-10-CM | POA: Diagnosis not present

## 2015-11-12 DIAGNOSIS — D123 Benign neoplasm of transverse colon: Secondary | ICD-10-CM | POA: Diagnosis not present

## 2015-11-12 DIAGNOSIS — I10 Essential (primary) hypertension: Secondary | ICD-10-CM | POA: Diagnosis not present

## 2015-11-12 DIAGNOSIS — Z86010 Personal history of colonic polyps: Secondary | ICD-10-CM | POA: Diagnosis not present

## 2015-11-12 DIAGNOSIS — F419 Anxiety disorder, unspecified: Secondary | ICD-10-CM | POA: Diagnosis not present

## 2015-11-12 MED ORDER — SODIUM CHLORIDE 0.9 % IV SOLN: 500.0000 mL | INTRAVENOUS | Status: DC

## 2015-11-12 NOTE — Op Note (Signed)
[facility] Patient Name: Anita Harrell Procedure Date: 11/12/2015 9:11 AM MRN: [id number] Endoscopist: Gatha Mayer , MD Age: 65 Referring MD:  Date of Birth: [birth year] Gender: Female Account #: [id number] Procedure:                Colonoscopy Indications:              Surveillance: Personal history of adenomatous                            polyps on last colonoscopy > 5 years ago Medicines:                Propofol per Anesthesia, Monitored Anesthesia Care Procedure:                Pre-Anesthesia Assessment:                           - Prior to the procedure, a History and Physical                            was performed, and patient medications and                            allergies were reviewed. The patient's tolerance of                            previous anesthesia was also reviewed. The risks                            and benefits of the procedure and the sedation                            options and risks were discussed with the patient.                            All questions were answered, and informed consent                            was obtained. Prior Anticoagulants: The patient has                            taken no previous anticoagulant or antiplatelet                            agents. ASA Grade Assessment: II - A patient with                            mild systemic disease. After reviewing the risks                            and benefits, the patient was deemed in                            satisfactory condition to undergo the procedure.  After obtaining informed consent, the colonoscope                            was passed under direct vision. Throughout the                            procedure, the patient's blood pressure, pulse, and                            oxygen saturations were monitored continuously. The                            Model PCF-H190L [id number]) scope was introduced     through the anus and advanced to the the cecum,                            identified by appendiceal orifice and ileocecal                            valve. The ileocecal valve, appendiceal orifice,                            and rectum were photographed. The colonoscopy was                            performed without difficulty. The patient tolerated                            the procedure well. The quality of the bowel                            preparation was good. The bowel preparation used                            was Miralax. Scope In: 9:20:14 AM Scope Out: 9:35:22 AM Scope Withdrawal Time: 0 hours 9 minutes 53 seconds  Total Procedure Duration: 0 hours 15 minutes 8 seconds  Findings:                 The perianal and digital rectal examinations were                            normal.                           Two sessile polyps were found in the transverse                            colon. The polyps were 4 to 6 mm in size. These                            polyps were removed with a cold snare. Resection                            and retrieval  were complete. Verification of                            patient identification for the specimen was done.                            Estimated blood loss was minimal.                           Multiple diverticula were found in the sigmoid                            colon.                           The exam was otherwise without abnormality on                            direct and retroflexion views. Complications:            No immediate complications. Estimated Blood Loss:     Estimated blood loss was minimal. Impression:               - Two 4 to 6 mm polyps in the transverse colon,                            removed with a cold snare. Resected and retrieved.                           - Moderate diverticulosis in the sigmoid colon.                           - The examination was otherwise normal on direct                             and retroflexion views.                           - Personal history of colonic polyps. 2 rectal                            adenomas < 1 cm 2006 Recommendation:           - Patient has a contact number available for                            emergencies. The signs and symptoms of potential                            delayed complications were discussed with the                            patient. Return to normal activities tomorrow.                            Written discharge instructions were provided to the  patient.                           - Resume previous diet.                           - Continue present medications.                           - Repeat colonoscopy is recommended. The                            colonoscopy date will be determined after pathology                            results from today's exam become available for                            review. Gatha Mayer, MD 11/12/2015 9:43:42 AM This report has been signed electronically.

## 2015-11-12 NOTE — Patient Instructions (Addendum)
I found and removed 2 small polyps that look benign. I will let you know pathology results and when to have another routine colonoscopy by mail.  You also have a condition called diverticulosis - common and not usually a problem. Please read the handout provided.  I appreciate the opportunity to care for you. Gatha Mayer, MD, FACG   YOU HAD AN ENDOSCOPIC PROCEDURE TODAY AT [facility]:   Refer to the procedure report that was given to you for any specific questions about what was found during the examination.  If the procedure report does not answer your questions, please call your gastroenterologist to clarify.  If you requested that your care partner not be given the details of your procedure findings, then the procedure report has been included in a sealed envelope for you to review at your convenience later.  YOU SHOULD EXPECT: Some feelings of bloating in the abdomen. Passage of more gas than usual.  Walking can help get rid of the air that was put into your GI tract during the procedure and reduce the bloating. If you had a lower endoscopy (such as a colonoscopy or flexible sigmoidoscopy) you may notice spotting of blood in your stool or on the toilet paper. If you underwent a bowel prep for your procedure, you may not have a normal bowel movement for a few days.  Please Note:  You might notice some irritation and congestion in your nose or some drainage.  This is from the oxygen used during your procedure.  There is no need for concern and it should clear up in a day or so.  SYMPTOMS TO REPORT IMMEDIATELY:   Following lower endoscopy (colonoscopy or flexible sigmoidoscopy):  Excessive amounts of blood in the stool  Significant tenderness or worsening of abdominal pains  Swelling of the abdomen that is new, acute  Fever of 100F or higher  For urgent or emergent issues, a gastroenterologist can be reached at any hour by calling [phone].   DIET:   We do recommend a small meal at first, but then you may proceed to your regular diet.  Drink plenty of fluids but you should avoid alcoholic beverages for 24 hours.  ACTIVITY:  You should plan to take it easy for the rest of today and you should NOT DRIVE or use heavy machinery until tomorrow (because of the sedation medicines used during the test).    FOLLOW UP: Our staff will call the number listed on your records the next business day following your procedure to check on you and address any questions or concerns that you may have regarding the information given to you following your procedure. If we do not reach you, we will leave a message.  However, if you are feeling well and you are not experiencing any problems, there is no need to return our call.  We will assume that you have returned to your regular daily activities without incident.  If any biopsies were taken you will be contacted by phone or by letter within the next 1-3 weeks.  Please call us at [phone] if you have not heard about the biopsies in 3 weeks.    SIGNATURES/CONFIDENTIALITY: You and/or your care partner have signed paperwork which will be entered into your electronic medical record.  These signatures attest to the fact that that the information above on your After Visit Summary has been reviewed and is understood.  Full responsibility of the confidentiality of this discharge information  lies with you and/or your care-partner.

## 2015-11-12 NOTE — Progress Notes (Signed)
To recovery awake alert vss report to [employer]

## 2015-11-12 NOTE — Progress Notes (Signed)
Called to room to assist during endoscopic procedure.  Patient ID and intended procedure confirmed with present staff. Received instructions for my participation in the procedure from the performing physician.  

## 2015-11-16 ENCOUNTER — Telehealth: Payer: Self-pay | Admitting: *Deleted

## 2015-11-16 NOTE — Telephone Encounter (Signed)
  Follow up Call-  Call back number 11/12/2015  Post procedure Call Back phone  # [phone]  Permission to leave phone message Yes  Some recent data might be hidden     Patient questions:  Do you have a fever, pain , or abdominal swelling? No. Pain Score  0 *  Have you tolerated food without any problems? Yes.    Have you been able to return to your normal activities? Yes.    Do you have any questions about your discharge instructions: Diet   No. Medications  No. Follow up visit  No.  Do you have questions or concerns about your Care? No.  Actions: * If pain score is 4 or above: No action needed, pain <4.

## 2015-11-18 ENCOUNTER — Encounter: Payer: Self-pay | Admitting: Internal Medicine

## 2015-11-18 DIAGNOSIS — Z86010 Personal history of colonic polyps: Secondary | ICD-10-CM

## 2015-11-18 NOTE — Progress Notes (Signed)
Adenoma x 1 and ssp/a x 1 Recall 2022

## 2015-12-26 ENCOUNTER — Other Ambulatory Visit: Payer: Self-pay | Admitting: Family Medicine

## 2015-12-27 NOTE — Telephone Encounter (Signed)
Rx called in as prescribed 

## 2015-12-27 NOTE — Telephone Encounter (Signed)
Px written for call in   

## 2015-12-27 NOTE — Telephone Encounter (Signed)
Last OV was 11/01/15, last filled on 09/24/15 #30 tabs with 1 additional refills, please advise

## 2016-02-20 ENCOUNTER — Other Ambulatory Visit: Payer: Self-pay | Admitting: Family Medicine

## 2016-04-21 ENCOUNTER — Other Ambulatory Visit: Payer: Self-pay | Admitting: Family Medicine

## 2016-04-21 NOTE — Telephone Encounter (Signed)
She got 30 pills on 1/11  Will go ahead and fill  Px written for call in

## 2016-04-21 NOTE — Telephone Encounter (Signed)
Last filled on 12/27/15 #30 tabs with 3 additional refills (? If to early), please advise

## 2016-04-21 NOTE — Telephone Encounter (Signed)
Rx called in as prescribed 

## 2016-06-06 IMAGING — MG MM SCREENING BREAST TOMO BILATERAL
8 of 15 series · 8 of 31 positions shown · non-contrast
Comparison: Previous exam(s).

CLINICAL DATA: Screening.

EXAM:
2D DIGITAL SCREENING BILATERAL MAMMOGRAM WITH CAD AND ADJUNCT TOMO

[R XCCL]
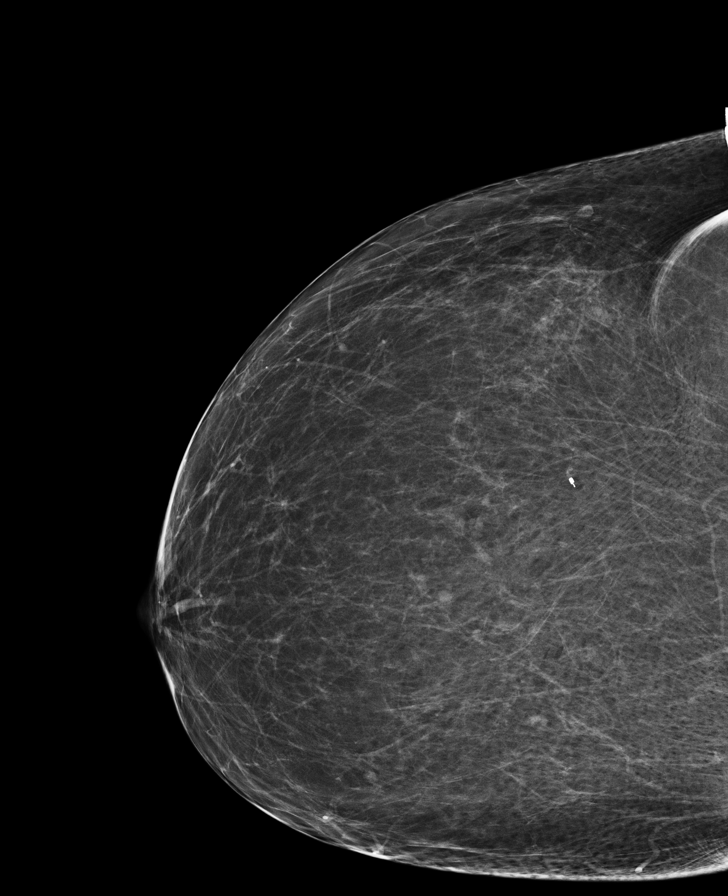

[R MLO (1 of 2)]
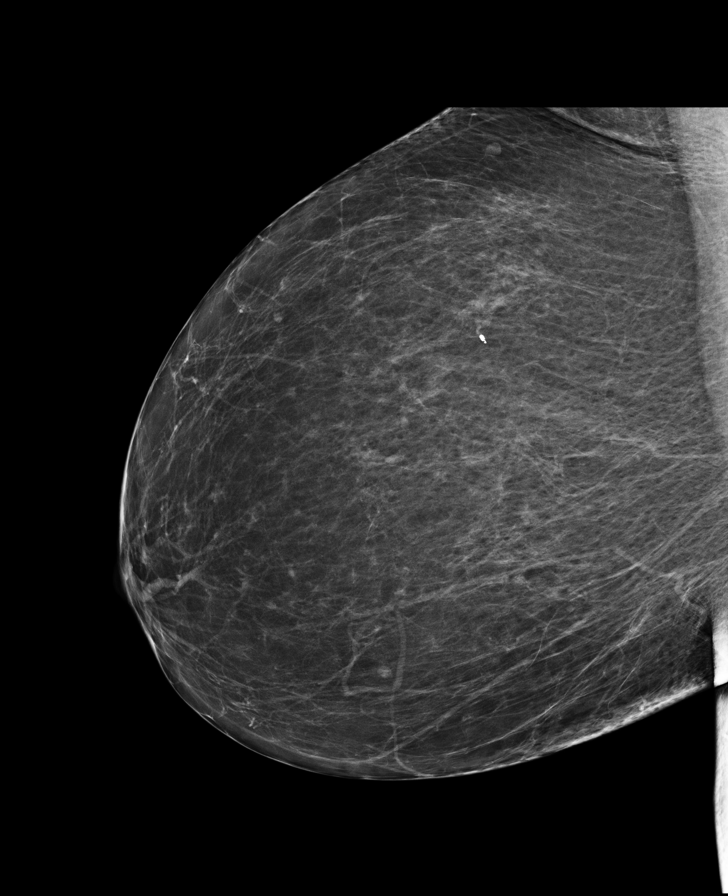

[L MLO (1 of 2)]
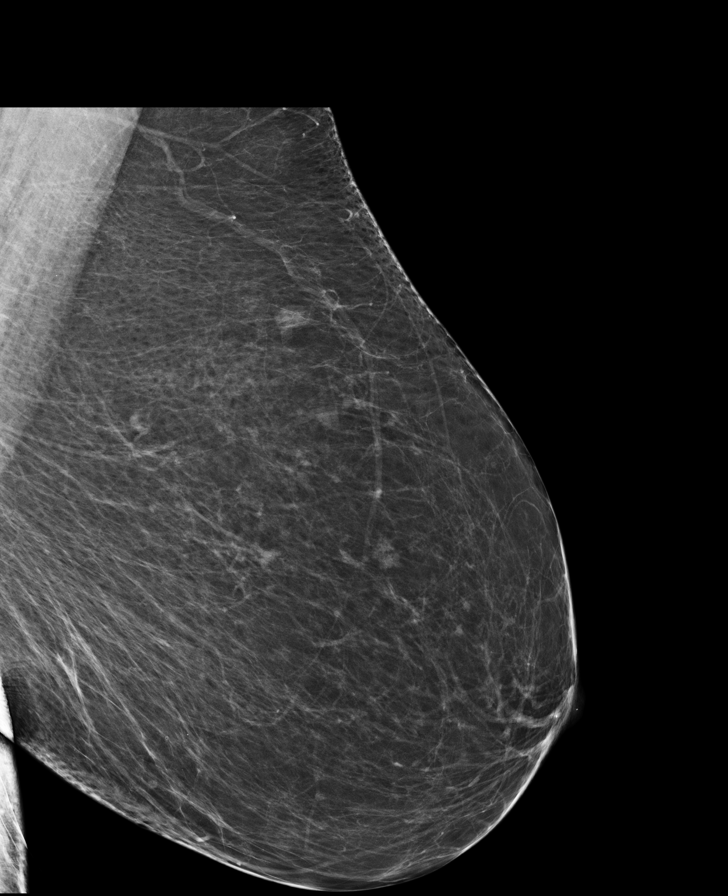

[L CC synth-2D]
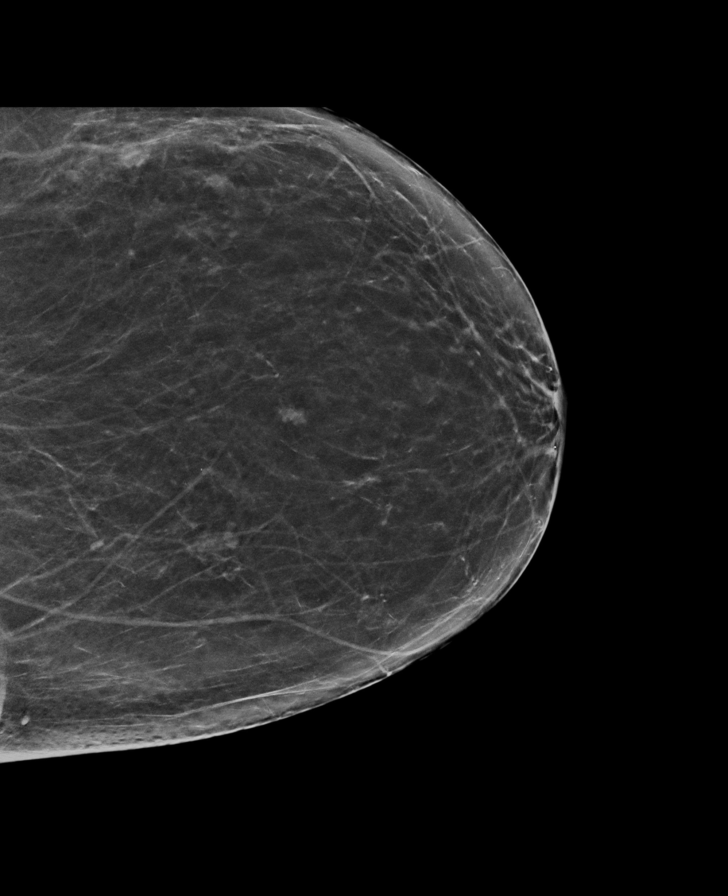

[L CC]
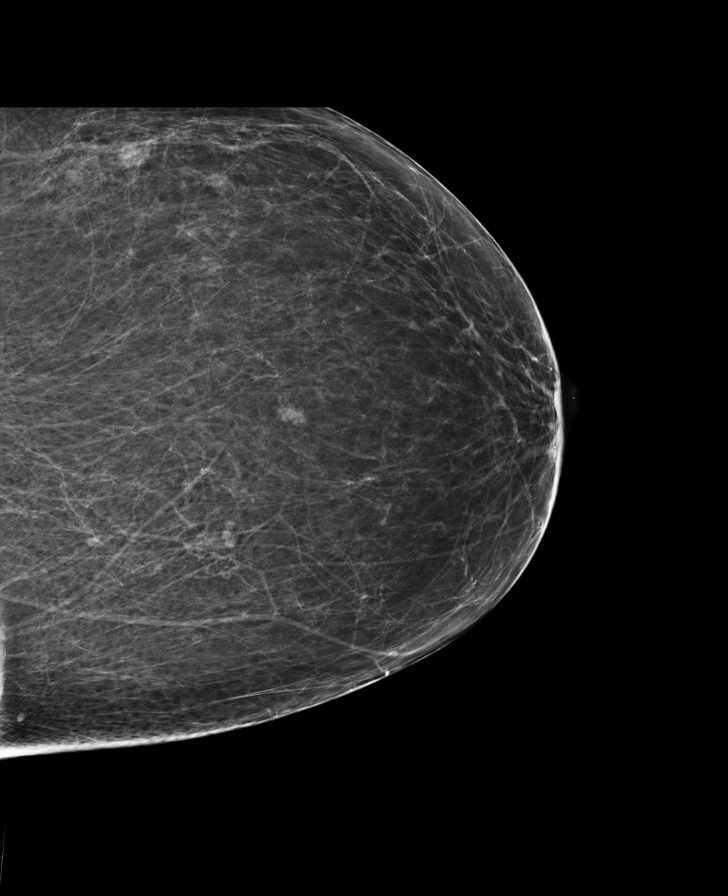

[L MLO (2 of 2)]
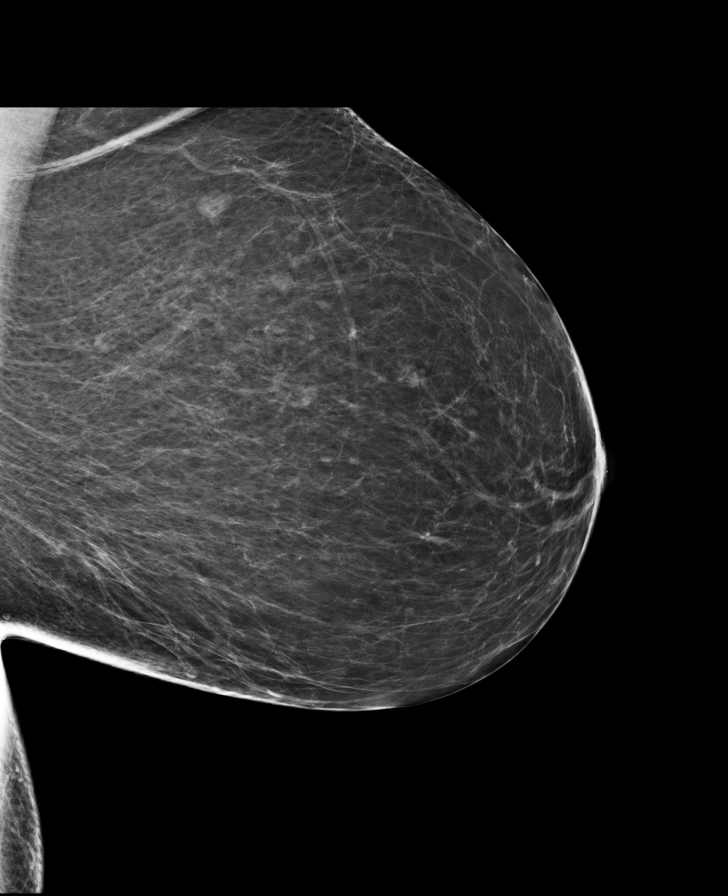

[R MLO (2 of 2)]
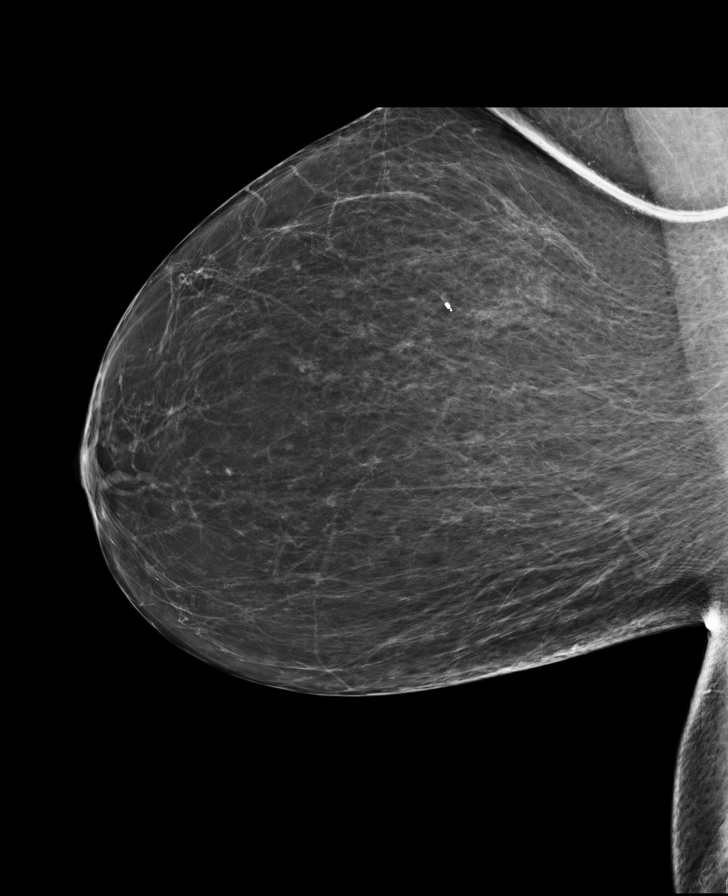

[R MLO synth-2D]
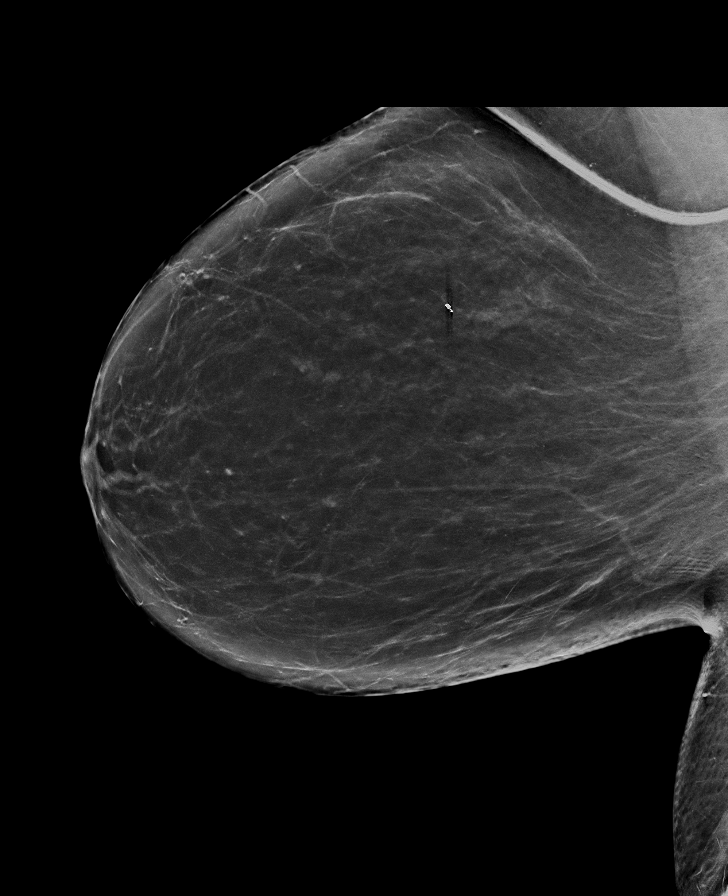

[8 of 31 positions shown; findings below may reference images not displayed]

ACR Breast Density Category b: There are scattered areas of
fibroglandular density.
FINDINGS: There are no findings suspicious for malignancy. Images were
processed with CAD.
IMPRESSION: No mammographic evidence of malignancy. A result letter of this
screening mammogram will be mailed directly to the patient.

RECOMMENDATION:
Screening mammogram in one year. (Code:97-6-RS4)

BI-RADS CATEGORY  1: Negative.

## 2016-06-14 ENCOUNTER — Other Ambulatory Visit: Payer: Self-pay | Admitting: Family Medicine

## 2016-08-08 ENCOUNTER — Other Ambulatory Visit: Payer: Self-pay | Admitting: Family Medicine

## 2016-08-08 NOTE — Telephone Encounter (Signed)
Px written for call in   

## 2016-08-08 NOTE — Telephone Encounter (Signed)
Requesting:  zolpidem Contract   Signed on 07/13/2015 UDS   Low risk---was due on 01/13/2016 Last OV    11/01/2015 Last Refill    #30 with 3 refills on 04/21/2016  Please Advise

## 2016-08-09 NOTE — Telephone Encounter (Signed)
Rx called in as prescribed 

## 2016-10-04 ENCOUNTER — Other Ambulatory Visit: Payer: Self-pay | Admitting: Family Medicine

## 2016-10-04 NOTE — Telephone Encounter (Signed)
Last OV was 11/07/15, and no future appts., please advise

## 2016-10-04 NOTE — Telephone Encounter (Signed)
Please schedule f/u in sept and refill until then  

## 2016-10-05 NOTE — Telephone Encounter (Signed)
Called home # and it was busy, called cell # and no answer so left voicemail requesting pt to call the office back

## 2016-10-06 NOTE — Telephone Encounter (Signed)
appt scheduled and med refill 

## 2016-10-25 ENCOUNTER — Encounter (INDEPENDENT_AMBULATORY_CARE_PROVIDER_SITE_OTHER): Payer: Self-pay

## 2016-10-25 ENCOUNTER — Ambulatory Visit (INDEPENDENT_AMBULATORY_CARE_PROVIDER_SITE_OTHER): Payer: Medicare Other | Admitting: Family Medicine

## 2016-10-25 ENCOUNTER — Encounter: Payer: Self-pay | Admitting: Family Medicine

## 2016-10-25 VITALS — BP 112/62 | HR 111 | Temp 98.2°F | Ht 64.0 in | Wt 196.5 lb

## 2016-10-25 DIAGNOSIS — R739 Hyperglycemia, unspecified: Secondary | ICD-10-CM

## 2016-10-25 DIAGNOSIS — F411 Generalized anxiety disorder: Secondary | ICD-10-CM | POA: Diagnosis not present

## 2016-10-25 DIAGNOSIS — E7800 Pure hypercholesterolemia, unspecified: Secondary | ICD-10-CM | POA: Diagnosis not present

## 2016-10-25 DIAGNOSIS — F418 Other specified anxiety disorders: Secondary | ICD-10-CM | POA: Diagnosis not present

## 2016-10-25 DIAGNOSIS — F419 Anxiety disorder, unspecified: Secondary | ICD-10-CM

## 2016-10-25 DIAGNOSIS — R000 Tachycardia, unspecified: Secondary | ICD-10-CM

## 2016-10-25 DIAGNOSIS — I10 Essential (primary) hypertension: Secondary | ICD-10-CM | POA: Diagnosis not present

## 2016-10-25 MED ORDER — LISINOPRIL-HYDROCHLOROTHIAZIDE 10-12.5 MG PO TABS: 1.0000 | ORAL_TABLET | Freq: Every day | ORAL | 3 refills | Status: DC

## 2016-10-25 MED ORDER — BUSPIRONE HCL 15 MG PO TABS: 15.0000 mg | ORAL_TABLET | Freq: Two times a day (BID) | ORAL | 3 refills | Status: DC

## 2016-10-25 MED ORDER — ROSUVASTATIN CALCIUM 10 MG PO TABS: ORAL_TABLET | ORAL | 3 refills | Status: DC

## 2016-10-25 MED ORDER — ALPRAZOLAM 0.5 MG PO TABS: 0.5000 mg | ORAL_TABLET | Freq: Every day | ORAL | 3 refills | Status: DC | PRN

## 2016-10-25 MED ORDER — PAROXETINE HCL 40 MG PO TABS: ORAL_TABLET | ORAL | 3 refills | Status: DC

## 2016-10-25 MED ORDER — ZOLPIDEM TARTRATE 10 MG PO TABS: 10.0000 mg | ORAL_TABLET | Freq: Every day | ORAL | 0 refills | Status: DC

## 2016-10-25 NOTE — Patient Instructions (Addendum)
Start some exercise  Walking/bike  Keep getting outside  Also try to eat a healthy balanced diet   Labs today  Continue current medicines  Add buspar 15 mg    1/2 pill twice daily for a week and if doing ok then increase to 1 pill twice daily  This is for anxiety /panic and it also helps depression   We will refer you to therapy

## 2016-10-25 NOTE — Progress Notes (Signed)
Subjective:    Patient ID: Anita Harrell, female    DOB: [birth year], 66 y.o.   MRN: [id number]  HPI  Here for f/u of chronic health problems  Feeling ok overall  Not motivated to do a lot  Not exercising  She does get outdoors   Not much appetite  Some protein shakes     Wt Readings from Last 3 Encounters:  10/25/16 196 lb 8 oz (89.1 kg)  11/12/15 192 lb (87.1 kg)  11/01/15 192 lb 4 oz (87.2 kg)   33.73 kg/m  bp is stable today  No cp or palpitations or headaches or edema  No side effects to medicines  BP Readings from Last 3 Encounters:  10/25/16 112/62  11/12/15 120/60  11/01/15 118/66      Hx of tachycardia ? anx related Pulse Readings from Last 3 Encounters:  10/25/16 (!) 111  11/12/15 79  11/01/15 100     Anxiety disorder as well as depression  paxil  60 mg  Still has some panic attacks - had a break and they recently started coming back  Has a harder time in the summer because that is when she lost her mother and sisters  Has a best friend to talk to  She does not want to burden her husband  Last 3-4 minutes  occ tearful spell  [facility] takes if she really needs them     Hyperlipidemia Lab Results  Component Value Date   CHOL 131 08/30/2015   HDL 49.00 08/30/2015   LDLCALC 57 08/30/2015   LDLDIRECT 73.0 08/21/2014   TRIG 128.0 08/30/2015   CHOLHDL 3 08/30/2015    Hyperglycemia Lab Results  Component Value Date   HGBA1C 6.0 08/30/2015   Patient Active Problem List   Diagnosis Date Noted  . Tachycardia 11/01/2015  . Microscopic hematuria 08/13/2012  . Routine general medical examination at a health care facility 07/22/2012  . Hyperglycemia 02/21/2008  . HYPERTENSION, BENIGN ESSENTIAL 01/22/2008  . ALLERGIC RHINITIS 07/25/2007  . BACK PAIN 06/26/2007  . Personal history of goiter 07/27/2006  . Hyperlipidemia 07/27/2006  . Anxiety disorder 07/27/2006  . PANIC DISORDER 07/27/2006  . Former smoker 07/27/2006  . Depression  with anxiety 07/27/2006  . INSOMNIA, HX OF 07/27/2006  . Personal history of colonic polyps 06/09/2004   Past Medical History:  Diagnosis Date  . Allergy    allergic rhinitis  . Anxiety   . Back pain   . Chest pain   . Depression   . Epigastric pain   . Goiter   . History of tobacco abuse   . Hyperglycemia    mild  . Hyperlipidemia   . Hypertension   . Insomnia    Hx of  . Labile blood pressure   . Panic disorder    History of  . Personal history of colonic polyps 06/09/2004  . SVD (spontaneous vaginal delivery)    x 2   Past Surgical History:  Procedure Laterality Date  . ABDOMINAL HYSTERECTOMY N/A 09/24/2012   Procedure: HYSTERECTOMY ABDOMINAL;  Surgeon: Gus Height, MD;  Location: [facility];  Service: Gynecology;  Laterality: N/A;  . COLONOSCOPY    . ELBOW SURGERY  2004  . EYE SURGERY     bilateral lasik  . SALPINGOOPHORECTOMY Bilateral 09/24/2012   Procedure: SALPINGO OOPHORECTOMY;  Surgeon: Gus Height, MD;  Location: [facility];  Service: Gynecology;  Laterality: Bilateral;  . THYROID SURGERY     B9 massess  . WART FULGURATION N/A 09/24/2012  Procedure: FULGURATION VAGINAL WART;  Surgeon: Gus Height, MD;  Location: [facility];  Service: Gynecology;  Laterality: N/A;  . WISDOM TOOTH EXTRACTION     Social History  Substance Use Topics  . Smoking status: Former Smoker    Packs/day: 1.00    Years: 20.00    Types: Cigarettes    Quit date: 12/11/2008  . Smokeless tobacco: Never Used  . Alcohol use No   Family History  Problem Relation Age of Onset  . Hypertension Mother   . Stroke Mother   . Alcohol abuse Father   . Cancer Father        lung CA  . Heart disease Father        CHF  . Cancer Sister        breast CA  . Heart disease Sister        CHF from chemotx also has defib  . Colon cancer Neg Hx   . Esophageal cancer Neg Hx   . Rectal cancer Neg Hx   . Stomach cancer Neg Hx    Allergies  Allergen Reactions  . Codeine Nausea And Vomiting    REACTION: Nausea  and vomiting Pt has tolerated vicodin & percocet in the past  . Lipitor [Atorvastatin]     Muscle pain   . Wellbutrin [Bupropion]    Current Outpatient Prescriptions on File Prior to Visit  Medication Sig Dispense Refill  . albuterol (PROVENTIL HFA;VENTOLIN HFA) 108 (90 Base) MCG/ACT inhaler Inhale 2 puffs into the lungs every 6 (six) hours as needed for wheezing or shortness of breath. 1 Inhaler 0  . aspirin 81 MG tablet Take 81 mg by mouth daily.    Marland Kitchen NASONEX 50 MCG/ACT nasal spray PLACE 2 SPRAYS INTO THE NOSE DAILY. (Patient taking differently: PLACE 2 SPRAYS INTO THE NOSE DAILY as needed) 17 g 5  . Omega-3 Fatty Acids (FISH OIL PO) Take 1 capsule by mouth daily.    Marland Kitchen tolterodine (DETROL LA) 4 MG 24 hr capsule Take 4 mg by mouth daily.  11   Current Facility-Administered Medications on File Prior to Visit  Medication Dose Route Frequency Provider Last Rate Last Dose  . 0.9 %  sodium chloride infusion  500 mL Intravenous Continuous Gatha Mayer, MD         Review of Systems Review of Systems  Constitutional: Negative for fever, appetite change, fatigue and unexpected weight change.  Eyes: Negative for pain and visual disturbance.  Respiratory: Negative for cough and shortness of breath.   Cardiovascular: Negative for cp or palpitations    Gastrointestinal: Negative for nausea, diarrhea and constipation.  Genitourinary: Negative for urgency and frequency.  Skin: Negative for pallor or rash   Neurological: Negative for weakness, light-headedness, numbness and headaches.  Hematological: Negative for adenopathy. Does not bruise/bleed easily.  Psychiatric/Behavioral: pos for anxious mood with panic attacks and occ tearfulness /grief / neg for SI       Objective:   Physical Exam  Constitutional: She appears well-developed and well-nourished. No distress.  obese and well appearing   HENT:  Head: Normocephalic and atraumatic.  Mouth/Throat: Oropharynx is clear and moist.  Eyes:  Pupils are equal, round, and reactive to light. Conjunctivae and EOM are normal.  Neck: Normal range of motion. Neck supple. No JVD present. Carotid bruit is not present. No thyromegaly present.  Cardiovascular: Normal rate, regular rhythm, normal heart sounds and intact distal pulses.  Exam reveals no gallop.   Pulmonary/Chest: Effort normal and breath  sounds normal. No respiratory distress. She has no wheezes. She has no rales.  No crackles  Abdominal: Soft. Bowel sounds are normal. She exhibits no distension, no abdominal bruit and no mass. There is no tenderness.  Musculoskeletal: She exhibits no edema.  Lymphadenopathy:    She has no cervical adenopathy.  Neurological: She is alert. She has normal reflexes.  Skin: Skin is warm and dry. No rash noted.  Psychiatric: Her speech is normal and behavior is normal. Thought content normal. Her mood appears anxious. Her affect is not blunt, not labile and not inappropriate. Thought content is not paranoid. Cognition and memory are normal. She does not exhibit a depressed mood. She expresses no homicidal and no suicidal ideation.  Tearful when discussing grief           Assessment & Plan:   Problem List Items Addressed This Visit      Cardiovascular and Mediastinum   HYPERTENSION, BENIGN ESSENTIAL - Primary    bp in fair control at this time  BP Readings from Last 1 Encounters:  10/25/16 112/62   No changes needed Disc lifstyle change with low sodium diet and exercise  Labs ordered  Refilled lisinopril hct       Relevant Medications   rosuvastatin (CRESTOR) 10 MG tablet   lisinopril-hydrochlorothiazide (PRINZIDE,ZESTORETIC) 10-12.5 MG tablet   Other Relevant Orders   CBC with Differential/Platelet (Completed)   Lipid panel (Completed)   Comprehensive metabolic panel (Completed)   TSH (Completed)     Other   Anxiety disorder    Worse lately (panic attacks) with anniversary of deaths  Continue paxil 92  Add buspar 7.5 mg bid  and titrate to 15 mg bid if tol Discussed expectations of this medication including time to effectiveness and mechanism of action, also poss of side effects (early and late)- including mental fuzziness, weight or appetite change, nausea and poss of worse dep or anxiety (even suicidal thoughts)  Pt voiced understanding and will stop med and update if this occurs   Ref to counseling Disc imp of self care       Relevant Orders   Ambulatory referral to Psychology   Depression with anxiety    Worse lately (panic attacks) with anniversary of deaths  Continue paxil 21  Add buspar 7.5 mg bid and titrate to 15 mg bid if tol Discussed expectations of this medication including time to effectiveness and mechanism of action, also poss of side effects (early and late)- including mental fuzziness, weight or appetite change, nausea and poss of worse dep or anxiety (even suicidal thoughts)  Pt voiced understanding and will stop med and update if this occurs   Ref to counseling Disc imp of self care       Hyperglycemia    Lab today for A1C  disc imp of low glycemic diet and wt loss to prevent DM2       Relevant Orders   Hemoglobin A1c (Completed)   Hyperlipidemia    Disc goals for lipids and reasons to control them Rev labs with pt (last draw) Lipid panel today Rev low sat fat diet in detail  Continue crestor      Relevant Medications   rosuvastatin (CRESTOR) 10 MG tablet   lisinopril-hydrochlorothiazide (PRINZIDE,ZESTORETIC) 10-12.5 MG tablet   Tachycardia    This tends to occur when pt is anxious like today  Continue to monitor here and at home  Pulse Readings from Last 3 Encounters:  10/25/16 (!) 111  11/12/15 79  11/01/15  100          Other Visit Diagnoses    Generalized anxiety disorder       Relevant Orders   Ambulatory referral to Psychology

## 2016-10-26 LAB — COMPREHENSIVE METABOLIC PANEL
ALBUMIN: 4.3 g/dL (ref 3.5–5.2)
ALK PHOS: 49 U/L (ref 39–117)
ALT: 15 U/L (ref 0–35)
AST: 14 U/L (ref 0–37)
BILIRUBIN TOTAL: 0.5 mg/dL (ref 0.2–1.2)
BUN: 11 mg/dL (ref 6–23)
CALCIUM: 9.8 mg/dL (ref 8.4–10.5)
CO2: 27 mEq/L (ref 19–32)
CREATININE: 0.62 mg/dL (ref 0.40–1.20)
Chloride: 102 mEq/L (ref 96–112)
GFR: 101.85 mL/min (ref >60.00)
Glucose, Bld: 93 mg/dL (ref 70–99)
Potassium: 4.1 mEq/L (ref 3.5–5.1)
Sodium: 137 mEq/L (ref 135–145)
TOTAL PROTEIN: 7.3 g/dL (ref 6.0–8.3)

## 2016-10-26 LAB — CBC WITH DIFFERENTIAL/PLATELET
BASOS PCT: 0.7 % (ref 0.0–3.0)
Basophils Absolute: 0.1 10*3/uL (ref 0.0–0.1)
EOS ABS: 0.1 10*3/uL (ref 0.0–0.7)
Eosinophils Relative: 0.5 % (ref 0.0–5.0)
HCT: 40.1 % (ref 36.0–46.0)
HEMOGLOBIN: 13.4 g/dL (ref 12.0–15.0)
LYMPHS ABS: 2.2 10*3/uL (ref 0.7–4.0)
Lymphocytes Relative: 18.3 % (ref 12.0–46.0)
MCHC: 33.3 g/dL (ref 30.0–36.0)
MCV: 89 fl (ref 78.0–100.0)
MONO ABS: 3.1 10*3/uL — AB (ref 0.1–1.0)
Monocytes Relative: 25.5 % — ABNORMAL HIGH (ref 3.0–12.0)
NEUTROS PCT: 55 % (ref 43.0–77.0)
Neutro Abs: 6.6 10*3/uL (ref 1.4–7.7)
PLATELETS: 222 10*3/uL (ref 150.0–400.0)
RBC: 4.51 Mil/uL (ref 3.87–5.11)
RDW: 13.7 % (ref 11.5–15.5)
WBC: 12 10*3/uL — ABNORMAL HIGH (ref 4.0–10.5)

## 2016-10-26 LAB — LIPID PANEL
CHOL/HDL RATIO: 3
CHOLESTEROL: 116 mg/dL (ref 0–200)
HDL: 43.7 mg/dL (ref >39.00)
LDL Cholesterol: 41 mg/dL (ref 0–99)
NonHDL: 71.84
TRIGLYCERIDES: 155 mg/dL — AB (ref 0.0–149.0)
VLDL: 31 mg/dL (ref 0.0–40.0)

## 2016-10-26 LAB — TSH: TSH: 2.21 u[IU]/mL (ref 0.35–4.50)

## 2016-10-26 LAB — HEMOGLOBIN A1C: HEMOGLOBIN A1C: 6.3 % (ref 4.6–6.5)

## 2016-10-26 NOTE — Assessment & Plan Note (Signed)
Worse lately (panic attacks) with anniversary of deaths  Continue paxil 48  Add buspar 7.5 mg bid and titrate to 15 mg bid if tol Discussed expectations of this medication including time to effectiveness and mechanism of action, also poss of side effects (early and late)- including mental fuzziness, weight or appetite change, nausea and poss of worse dep or anxiety (even suicidal thoughts)  Pt voiced understanding and will stop med and update if this occurs   Ref to counseling Disc imp of self care

## 2016-10-26 NOTE — Assessment & Plan Note (Signed)
Worse lately (panic attacks) with anniversary of deaths  Continue paxil 65  Add buspar 7.5 mg bid and titrate to 15 mg bid if tol Discussed expectations of this medication including time to effectiveness and mechanism of action, also poss of side effects (early and late)- including mental fuzziness, weight or appetite change, nausea and poss of worse dep or anxiety (even suicidal thoughts)  Pt voiced understanding and will stop med and update if this occurs   Ref to counseling Disc imp of self care

## 2016-10-26 NOTE — Assessment & Plan Note (Signed)
bp in fair control at this time  BP Readings from Last 1 Encounters:  10/25/16 112/62   No changes needed Disc lifstyle change with low sodium diet and exercise  Labs ordered  Refilled lisinopril hct

## 2016-10-26 NOTE — Assessment & Plan Note (Signed)
This tends to occur when pt is anxious like today  Continue to monitor here and at home  Pulse Readings from Last 3 Encounters:  10/25/16 (!) 111  11/12/15 79  11/01/15 100

## 2016-10-26 NOTE — Assessment & Plan Note (Signed)
Disc goals for lipids and reasons to control them Rev labs with pt (last draw) Lipid panel today Rev low sat fat diet in detail  Continue crestor

## 2016-10-26 NOTE — Assessment & Plan Note (Signed)
Lab today for A1C  disc imp of low glycemic diet and wt loss to prevent DM2

## 2016-10-27 ENCOUNTER — Encounter: Payer: Self-pay | Admitting: *Deleted

## 2016-11-17 ENCOUNTER — Telehealth: Payer: Self-pay | Admitting: Family Medicine

## 2016-11-17 NOTE — Telephone Encounter (Signed)
Left pt message asking to call Ebony Hail back directly at [phone] to schedule AWV + labs with Katha Cabal and CPE with PCP.  *NOTE* Last AWV 08/30/15

## 2016-11-22 ENCOUNTER — Ambulatory Visit: Payer: Medicare Other | Admitting: Family Medicine

## 2016-11-24 ENCOUNTER — Ambulatory Visit (INDEPENDENT_AMBULATORY_CARE_PROVIDER_SITE_OTHER): Payer: Medicare Other | Admitting: Family Medicine

## 2016-11-24 ENCOUNTER — Encounter: Payer: Self-pay | Admitting: Family Medicine

## 2016-11-24 VITALS — BP 122/68 | HR 113 | Temp 98.0°F | Ht 64.0 in | Wt 197.0 lb

## 2016-11-24 DIAGNOSIS — F410 Panic disorder [episodic paroxysmal anxiety] without agoraphobia: Secondary | ICD-10-CM | POA: Diagnosis not present

## 2016-11-24 DIAGNOSIS — Z23 Encounter for immunization: Secondary | ICD-10-CM

## 2016-11-24 DIAGNOSIS — I10 Essential (primary) hypertension: Secondary | ICD-10-CM

## 2016-11-24 DIAGNOSIS — F419 Anxiety disorder, unspecified: Secondary | ICD-10-CM | POA: Diagnosis not present

## 2016-11-24 DIAGNOSIS — J069 Acute upper respiratory infection, unspecified: Secondary | ICD-10-CM | POA: Diagnosis not present

## 2016-11-24 DIAGNOSIS — D72829 Elevated white blood cell count, unspecified: Secondary | ICD-10-CM | POA: Diagnosis not present

## 2016-11-24 DIAGNOSIS — R000 Tachycardia, unspecified: Secondary | ICD-10-CM | POA: Diagnosis not present

## 2016-11-24 MED ORDER — BUSPIRONE HCL 15 MG PO TABS: 15.0000 mg | ORAL_TABLET | Freq: Two times a day (BID) | ORAL | 3 refills | Status: DC

## 2016-11-24 NOTE — Patient Instructions (Signed)
For cold symptoms - rest and drink fluids and treat symptoms over the counter if needed   I'm glad you are feeling better emotionally!  Continue current medicines   We will re check your white cell count when you return for next visit

## 2016-11-24 NOTE — Progress Notes (Signed)
Subjective:    Patient ID: Anita Harrell, female    DOB: Nov 22, 1950, 66 y.o.   MRN: [id number]  HPI  Here for f/u of chronic health problems   Woke up with st and runny nose today   Last visit - anxiety had worsened  On paxil Added buspar -now on 15 mg bid  Helping tremendously after the first week- no more panic attacks and no tearfulness  No side effects at all   Had to cancel her appt with psychology due to the weather   Wt Readings from Last 3 Encounters:  11/24/16 197 lb (89.4 kg)  10/25/16 196 lb 8 oz (89.1 kg)  11/12/15 192 lb (87.1 kg)   33.81 kg/m   BP Readings from Last 3 Encounters:  11/24/16 122/68  10/25/16 112/62  11/12/15 120/60   Pulse Readings from Last 3 Encounters:  11/24/16 (!) 113  10/25/16 (!) 111  11/12/15 79  pulse tends to go fast in the office    Lab Results  Component Value Date   WBC 12.0 (H) 10/25/2016   HGB 13.4 10/25/2016   HCT 40.1 10/25/2016   MCV 89.0 10/25/2016   PLT 222.0 10/25/2016     Patient Active Problem List   Diagnosis Date Noted  . Viral URI 11/24/2016  . Tachycardia 11/01/2015  . Microscopic hematuria 08/13/2012  . Routine general medical examination at a health care facility 07/22/2012  . Leukocytosis 03/30/2010  . Hyperglycemia 02/21/2008  . HYPERTENSION, BENIGN ESSENTIAL 01/22/2008  . ALLERGIC RHINITIS 07/25/2007  . BACK PAIN 06/26/2007  . Personal history of goiter 07/27/2006  . Hyperlipidemia 07/27/2006  . Anxiety disorder 07/27/2006  . PANIC DISORDER 07/27/2006  . Former smoker 07/27/2006  . Depression with anxiety 07/27/2006  . INSOMNIA, HX OF 07/27/2006  . Personal history of colonic polyps 06/09/2004   Past Medical History:  Diagnosis Date  . Allergy    allergic rhinitis  . Anxiety   . Back pain   . Chest pain   . Depression   . Epigastric pain   . Goiter   . History of tobacco abuse   . Hyperglycemia    mild  . Hyperlipidemia   . Hypertension   . Insomnia    Hx of  .  Labile blood pressure   . Panic disorder    History of  . Personal history of colonic polyps 06/09/2004  . SVD (spontaneous vaginal delivery)    x 2   Past Surgical History:  Procedure Laterality Date  . ABDOMINAL HYSTERECTOMY N/A 09/24/2012   Procedure: HYSTERECTOMY ABDOMINAL;  Surgeon: Gus Height, MD;  Location: [facility];  Service: Gynecology;  Laterality: N/A;  . COLONOSCOPY    . ELBOW SURGERY  2004  . EYE SURGERY     bilateral lasik  . SALPINGOOPHORECTOMY Bilateral 09/24/2012   Procedure: SALPINGO OOPHORECTOMY;  Surgeon: Gus Height, MD;  Location: [facility];  Service: Gynecology;  Laterality: Bilateral;  . THYROID SURGERY     B9 massess  . WART FULGURATION N/A 09/24/2012   Procedure: FULGURATION VAGINAL WART;  Surgeon: Gus Height, MD;  Location: [facility];  Service: Gynecology;  Laterality: N/A;  . WISDOM TOOTH EXTRACTION     Social History  Substance Use Topics  . Smoking status: Former Smoker    Packs/day: 1.00    Years: 20.00    Types: Cigarettes    Quit date: 12/11/2008  . Smokeless tobacco: Never Used  . Alcohol use No   Family History  Problem Relation  Age of Onset  . Hypertension Mother   . Stroke Mother   . Alcohol abuse Father   . Cancer Father        lung CA  . Heart disease Father        CHF  . Cancer Sister        breast CA  . Heart disease Sister        CHF from chemotx also has defib  . Colon cancer Neg Hx   . Esophageal cancer Neg Hx   . Rectal cancer Neg Hx   . Stomach cancer Neg Hx    Allergies  Allergen Reactions  . Codeine Nausea And Vomiting    REACTION: Nausea and vomiting Pt has tolerated vicodin & percocet in the past  . Lipitor [Atorvastatin]     Muscle pain   . Wellbutrin [Bupropion]    Current Outpatient Prescriptions on File Prior to Visit  Medication Sig Dispense Refill  . albuterol (PROVENTIL HFA;VENTOLIN HFA) 108 (90 Base) MCG/ACT inhaler Inhale 2 puffs into the lungs every 6 (six) hours as needed for wheezing or shortness of breath.  1 Inhaler 0  . ALPRAZolam (XANAX) 0.5 MG tablet Take 1 tablet (0.5 mg total) by mouth daily as needed for anxiety. 30 tablet 3  . aspirin 81 MG tablet Take 81 mg by mouth daily.    Marland Kitchen lisinopril-hydrochlorothiazide (PRINZIDE,ZESTORETIC) 10-12.5 MG tablet Take 1 tablet by mouth daily. 90 tablet 3  . NASONEX 50 MCG/ACT nasal spray PLACE 2 SPRAYS INTO THE NOSE DAILY. (Patient taking differently: PLACE 2 SPRAYS INTO THE NOSE DAILY as needed) 17 g 5  . Omega-3 Fatty Acids (FISH OIL PO) Take 1 capsule by mouth daily.    Marland Kitchen PARoxetine (PAXIL) 40 MG tablet TAKE 1.5 TABLETS BY MOUTH ONCE DAILY 135 tablet 3  . rosuvastatin (CRESTOR) 10 MG tablet TAKE 1 TABLET (10 MG TOTAL) BY MOUTH DAILY. 90 tablet 3  . tolterodine (DETROL LA) 4 MG 24 hr capsule Take 4 mg by mouth daily.  11  . zolpidem (AMBIEN) 10 MG tablet Take 1 tablet (10 mg total) by mouth at bedtime. 90 tablet 0   Current Facility-Administered Medications on File Prior to Visit  Medication Dose Route Frequency Provider Last Rate Last Dose  . 0.9 %  sodium chloride infusion  500 mL Intravenous Continuous Gatha Mayer, MD        Review of Systems  Constitutional: Negative for activity change, appetite change, fatigue, fever and unexpected weight change.  HENT: Positive for congestion, postnasal drip and rhinorrhea. Negative for ear pain, sinus pressure and sore throat.   Eyes: Negative for pain, redness and visual disturbance.  Respiratory: Negative for cough, shortness of breath and wheezing.   Cardiovascular: Negative for chest pain and palpitations.  Gastrointestinal: Negative for abdominal pain, blood in stool, constipation and diarrhea.  Endocrine: Negative for polydipsia and polyuria.  Genitourinary: Negative for dysuria, frequency and urgency.  Musculoskeletal: Negative for arthralgias, back pain and myalgias.  Skin: Negative for pallor and rash.  Allergic/Immunologic: Negative for environmental allergies.  Neurological: Negative for  dizziness, syncope and headaches.  Hematological: Negative for adenopathy. Does not bruise/bleed easily.  Psychiatric/Behavioral: Negative for decreased concentration, dysphoric mood and sleep disturbance. The patient is nervous/anxious.        Anxiety is improved        Objective:   Physical Exam  Constitutional: She appears well-developed and well-nourished. No distress.  obese and well appearing   HENT:  Head: Normocephalic  and atraumatic.  Right Ear: External ear normal.  Left Ear: External ear normal.  Mouth/Throat: Oropharynx is clear and moist.  Nares are injected and congested  No sinus tenderness Clear rhinorrhea and post nasal drip   Eyes: Pupils are equal, round, and reactive to light. Conjunctivae and EOM are normal. Right eye exhibits no discharge. Left eye exhibits no discharge.  Neck: Normal range of motion. Neck supple.  Cardiovascular: Normal heart sounds.   Pulmonary/Chest: Effort normal and breath sounds normal. No respiratory distress. She has no wheezes. She has no rales. She exhibits no tenderness.  Lymphadenopathy:    She has no cervical adenopathy.  Neurological: She is alert. She displays no tremor. No cranial nerve deficit. She exhibits normal muscle tone. Coordination normal.  Skin: Skin is warm and dry. No rash noted.  Psychiatric: She has a normal mood and affect. Her speech is normal and behavior is normal. Thought content normal. Her mood appears not anxious. Her affect is not blunt and not labile. Cognition and memory are normal. She does not exhibit a depressed mood.  Improved affect Cheerful and talkative           Assessment & Plan:   Problem List Items Addressed This Visit      Cardiovascular and Mediastinum   HYPERTENSION, BENIGN ESSENTIAL - Primary    bp in fair control at this time  BP Readings from Last 1 Encounters:  11/24/16 122/68   No changes needed Disc lifstyle change with low sodium diet and exercise           Respiratory   Viral URI    Mild- reassuring exam Disc symptomatic care - see instructions on AVS  Handout given  Update if not starting to improve in a week or if worsening          Other   Anxiety disorder    On paxil and buspar was added Much improvement Reviewed stressors/ coping techniques/symptoms/ support sources/ tx options and side effects in detail today Continue current medicines  Will re schedule her counseling appt - for grief issues       Leukocytosis    Recurrent mild inc wbc Possibly due to stress Suspect her early uri is viral but will watch  Re check this at next visit for annual exam      PANIC DISORDER    Much improved with addn of buspar  Less tearful as well No side eff Reviewed stressors/ coping techniques/symptoms/ support sources/ tx options and side effects in detail today  Continue current medications       Tachycardia    This is chronic and per pt - better at home  ? If whitecoat syndrome  Pulse of 113 today - but much less anx  Watch this  She will keep track of pulse at home  Not symptomatic        Other Visit Diagnoses    Need for influenza vaccination       Relevant Orders   Flu Vaccine QUAD 6+ mos PF IM (Fluarix Quad PF) (Completed)

## 2016-11-26 NOTE — Assessment & Plan Note (Signed)
bp in fair control at this time  BP Readings from Last 1 Encounters:  11/24/16 122/68   No changes needed Disc lifstyle change with low sodium diet and exercise

## 2016-11-26 NOTE — Assessment & Plan Note (Signed)
On paxil and buspar was added Much improvement Reviewed stressors/ coping techniques/symptoms/ support sources/ tx options and side effects in detail today Continue current medicines  Will re schedule her counseling appt - for grief issues

## 2016-11-26 NOTE — Assessment & Plan Note (Addendum)
Mild- reassuring exam Disc symptomatic care - see instructions on AVS  Handout given  Update if not starting to improve in a week or if worsening

## 2016-11-26 NOTE — Assessment & Plan Note (Signed)
This is chronic and per pt - better at home  ? If whitecoat syndrome  Pulse of 113 today - but much less anx  Watch this  She will keep track of pulse at home  Not symptomatic

## 2016-11-26 NOTE — Assessment & Plan Note (Signed)
Recurrent mild inc wbc Possibly due to stress Suspect her early uri is viral but will watch  Re check this at next visit for annual exam

## 2016-11-26 NOTE — Assessment & Plan Note (Signed)
Much improved with addn of buspar  Less tearful as well No side eff Reviewed stressors/ coping techniques/symptoms/ support sources/ tx options and side effects in detail today  Continue current medications

## 2016-11-28 ENCOUNTER — Ambulatory Visit: Payer: Medicare Other | Admitting: Psychology

## 2016-12-10 ENCOUNTER — Telehealth: Payer: Self-pay | Admitting: Family Medicine

## 2016-12-10 DIAGNOSIS — D72829 Elevated white blood cell count, unspecified: Secondary | ICD-10-CM

## 2016-12-10 NOTE — Telephone Encounter (Signed)
-----   Message from Eustace Pen, LPN sent at 4/32/7614  6:00 PM EDT ----- Regarding: Labs 10/5 Lab orders needed. If none, please advise.   Medicare

## 2016-12-15 ENCOUNTER — Ambulatory Visit (INDEPENDENT_AMBULATORY_CARE_PROVIDER_SITE_OTHER): Payer: Medicare Other

## 2016-12-15 VITALS — BP 118/80 | HR 94 | Temp 98.1°F | Ht 63.5 in | Wt 195.5 lb

## 2016-12-15 DIAGNOSIS — D72829 Elevated white blood cell count, unspecified: Secondary | ICD-10-CM | POA: Diagnosis not present

## 2016-12-15 DIAGNOSIS — Z0000 Encounter for general adult medical examination without abnormal findings: Secondary | ICD-10-CM | POA: Diagnosis not present

## 2016-12-15 LAB — CBC WITH DIFFERENTIAL/PLATELET
BASOS PCT: 1 % (ref 0.0–3.0)
EOS PCT: 4 % (ref 0.0–5.0)
HEMATOCRIT: 42.9 % (ref 36.0–46.0)
Hemoglobin: 14.2 g/dL (ref 12.0–15.0)
Lymphocytes Relative: 40 % (ref 12.0–46.0)
MCHC: 33.2 g/dL (ref 30.0–36.0)
MCV: 89.2 fl (ref 78.0–100.0)
Monocytes Relative: 7 % (ref 3.0–12.0)
Neutrophils Relative %: 48 % (ref 43.0–77.0)
Platelets: 201 10*3/uL (ref 150.0–400.0)
RBC: 4.8 Mil/uL (ref 3.87–5.11)
RDW: 13.2 % (ref 11.5–15.5)
WBC: 9.2 10*3/uL (ref 4.0–10.5)

## 2016-12-15 NOTE — Patient Instructions (Signed)
Anita Harrell , Thank you for taking time to come for your Medicare Wellness Visit. I appreciate your ongoing commitment to your health goals. Please review the following plan we discussed and let me know if I can assist you in the future.   These are the goals we discussed: Goals    . Increase physical activity          Starting 12/15/2016, I will continue to exercise for 5 min daily.        This is a list of the screening recommended for you and due dates:  Health Maintenance  Topic Date Due  . DEXA scan (bone density measurement)  12/15/2048*  . Mammogram  05/27/2017  . Colon Cancer Screening  11/11/2020  . Tetanus Vaccine  01/22/2022  . Flu Shot  Completed  .  Hepatitis C: One time screening is recommended by Center for Disease Control  (CDC) for  adults born from [birth year] through 1967.   Completed  . Pneumonia vaccines  Completed  *Topic was postponed. The date shown is not the original due date.   Preventive Care for Adults  A healthy lifestyle and preventive care can promote health and wellness. Preventive health guidelines for adults include the following key practices.  . A routine yearly physical is a good way to check with your health care provider about your health and preventive screening. It is a chance to share any concerns and updates on your health and to receive a thorough exam.  . Visit your dentist for a routine exam and preventive care every 6 months. Brush your teeth twice a day and floss once a day. Good oral hygiene prevents tooth decay and gum disease.  . The frequency of eye exams is based on your age, health, family medical history, use  of contact lenses, and other factors. Follow your health care provider's ecommendations for frequency of eye exams.  . Eat a healthy diet. Foods like vegetables, fruits, whole grains, low-fat dairy products, and lean protein foods contain the nutrients you need without too many calories. Decrease your intake of foods high in  solid fats, added sugars, and salt. Eat the right amount of calories for you. Get information about a proper diet from your health care provider, if necessary.  . Regular physical exercise is one of the most important things you can do for your health. Most adults should get at least 150 minutes of moderate-intensity exercise (any activity that increases your heart rate and causes you to sweat) each week. In addition, most adults need muscle-strengthening exercises on 2 or more days a week.  Silver Sneakers may be a benefit available to you. To determine eligibility, you may visit the website: www.silversneakers.com or contact program at 780-238-9660 Mon-Fri between 8AM-8PM.   . Maintain a healthy weight. The body mass index (BMI) is a screening tool to identify possible weight problems. It provides an estimate of body fat based on height and weight. Your health care provider can find your BMI and can help you achieve or maintain a healthy weight.   For adults 20 years and older: ? A BMI below 18.5 is considered underweight. ? A BMI of 18.5 to 24.9 is normal. ? A BMI of 25 to 29.9 is considered overweight. ? A BMI of 30 and above is considered obese.   . Maintain normal blood lipids and cholesterol levels by exercising and minimizing your intake of saturated fat. Eat a balanced diet with plenty of fruit and vegetables. Blood tests  for lipids and cholesterol should begin at age 35 and be repeated every 5 years. If your lipid or cholesterol levels are high, you are over 50, or you are at high risk for heart disease, you may need your cholesterol levels checked more frequently. Ongoing high lipid and cholesterol levels should be treated with medicines if diet and exercise are not working.  . If you smoke, find out from your health care provider how to quit. If you do not use tobacco, please do not start.  . If you choose to drink alcohol, please do not consume more than 2 drinks per day. One drink  is considered to be 12 ounces (355 mL) of beer, 5 ounces (148 mL) of wine, or 1.5 ounces (44 mL) of liquor.  . If you are 82-55 years old, ask your health care provider if you should take aspirin to prevent strokes.  . Use sunscreen. Apply sunscreen liberally and repeatedly throughout the day. You should seek shade when your shadow is shorter than you. Protect yourself by wearing long sleeves, pants, a wide-brimmed hat, and sunglasses year round, whenever you are outdoors.  . Once a month, do a whole body skin exam, using a mirror to look at the skin on your back. Tell your health care provider of new moles, moles that have irregular borders, moles that are larger than a pencil eraser, or moles that have changed in shape or color.

## 2016-12-15 NOTE — Progress Notes (Signed)
Pre visit review using our clinic review tool, if applicable. No additional management support is needed unless otherwise documented below in the visit note. 

## 2016-12-17 NOTE — Progress Notes (Signed)
Subjective:   Anita Harrell is a 66 y.o. female who presents for Medicare Annual (Subsequent) preventive examination.  Review of Systems:  N/A Cardiac Risk Factors include: advanced age (>55men, >50 women);hypertension;obesity (BMI >30kg/m2);dyslipidemia     Objective:     Vitals: BP 118/80 (BP Location: Right Arm, Patient Position: Sitting, Cuff Size: Normal)   Pulse 94   Temp 98.1 F (36.7 C) (Oral)   Ht 5' 3.5" (1.613 m) Comment: no shoes  Wt 195 lb 8 oz (88.7 kg)   SpO2 95%   BMI 34.09 kg/m   Body mass index is 34.09 kg/m.   Tobacco History  Smoking Status  . Former Smoker  . Packs/day: 1.00  . Years: 20.00  . Types: Cigarettes  . Quit date: 12/11/2008  Smokeless Tobacco  . Never Used     Counseling given: No   Past Medical History:  Diagnosis Date  . Allergy    allergic rhinitis  . Anxiety   . Back pain   . Chest pain   . Depression   . Epigastric pain   . Goiter   . History of tobacco abuse   . Hyperglycemia    mild  . Hyperlipidemia   . Hypertension   . Insomnia    Hx of  . Labile blood pressure   . Panic disorder    History of  . Personal history of colonic polyps 06/09/2004  . SVD (spontaneous vaginal delivery)    x 2   Past Surgical History:  Procedure Laterality Date  . ABDOMINAL HYSTERECTOMY N/A 09/24/2012   Procedure: HYSTERECTOMY ABDOMINAL;  Surgeon: Gus Height, MD;  Location: [facility];  Service: Gynecology;  Laterality: N/A;  . COLONOSCOPY    . COLONOSCOPY  2017  . ELBOW SURGERY  2004  . EYE SURGERY     bilateral lasik  . SALPINGOOPHORECTOMY Bilateral 09/24/2012   Procedure: SALPINGO OOPHORECTOMY;  Surgeon: Gus Height, MD;  Location: [facility];  Service: Gynecology;  Laterality: Bilateral;  . THYROID SURGERY     B9 massess  . WART FULGURATION N/A 09/24/2012   Procedure: FULGURATION VAGINAL WART;  Surgeon: Gus Height, MD;  Location: [facility];  Service: Gynecology;  Laterality: N/A;  . WISDOM TOOTH EXTRACTION     Family History    Problem Relation Age of Onset  . Hypertension Mother   . Stroke Mother   . Alcohol abuse Father   . Cancer Father        lung CA  . Heart disease Father        CHF  . Cancer Sister        breast CA  . Heart disease Sister        CHF from chemotx also has defib  . Colon cancer Neg Hx   . Esophageal cancer Neg Hx   . Rectal cancer Neg Hx   . Stomach cancer Neg Hx    History  Sexual Activity  . Sexual activity: Yes  . Birth control/ protection: Post-menopausal    Outpatient Encounter Prescriptions as of 12/15/2016  Medication Sig  . albuterol (PROVENTIL HFA;VENTOLIN HFA) 108 (90 Base) MCG/ACT inhaler Inhale 2 puffs into the lungs every 6 (six) hours as needed for wheezing or shortness of breath.  . ALPRAZolam (XANAX) 0.5 MG tablet Take 1 tablet (0.5 mg total) by mouth daily as needed for anxiety.  Marland Kitchen aspirin 81 MG tablet Take 81 mg by mouth daily.  . busPIRone (BUSPAR) 15 MG tablet Take 1 tablet (15 mg  total) by mouth 2 (two) times daily.  Marland Kitchen lisinopril-hydrochlorothiazide (PRINZIDE,ZESTORETIC) 10-12.5 MG tablet Take 1 tablet by mouth daily.  Marland Kitchen NASONEX 50 MCG/ACT nasal spray PLACE 2 SPRAYS INTO THE NOSE DAILY. (Patient taking differently: PLACE 2 SPRAYS INTO THE NOSE DAILY as needed)  . Omega-3 Fatty Acids (FISH OIL PO) Take 1 capsule by mouth daily.  Marland Kitchen PARoxetine (PAXIL) 40 MG tablet TAKE 1.5 TABLETS BY MOUTH ONCE DAILY  . rosuvastatin (CRESTOR) 10 MG tablet TAKE 1 TABLET (10 MG TOTAL) BY MOUTH DAILY.  Marland Kitchen zolpidem (AMBIEN) 10 MG tablet Take 1 tablet (10 mg total) by mouth at bedtime.  . [DISCONTINUED] tolterodine (DETROL LA) 4 MG 24 hr capsule Take 4 mg by mouth daily.   Facility-Administered Encounter Medications as of 12/15/2016  Medication  . 0.9 %  sodium chloride infusion    Activities of Daily Living In your present state of health, do you have any difficulty performing the following activities: 12/15/2016  Hearing? N  Vision? N  Difficulty concentrating or making  decisions? Y  Walking or climbing stairs? N  Dressing or bathing? N  Doing errands, shopping? N  Preparing Food and eating ? N  Using the Toilet? N  In the past six months, have you accidently leaked urine? N  Do you have problems with loss of bowel control? N  Managing your Medications? N  Managing your Finances? N  Housekeeping or managing your Housekeeping? N  Some recent data might be hidden    Patient Care Team: Tower, Wynelle Fanny, MD as PCP - Wynn Banker, MD as Consulting Physician (Obstetrics and Gynecology)    Assessment:     Hearing Screening   125Hz  250Hz  500Hz  1000Hz  2000Hz  3000Hz  4000Hz  6000Hz  8000Hz   Right ear:   40 40 40  40    Left ear:   40 40 40  40      Visual Acuity Screening   Right eye Left eye Both eyes  Without correction: 20/20 20/20 20/20   With correction:       Exercise Activities and Dietary recommendations Current Exercise Habits: The patient does not participate in regular exercise at present, Exercise limited by: None identified  Goals    . Increase physical activity          Starting 12/15/2016, I will continue to exercise for 5 min daily.       Fall Risk Fall Risk  12/15/2016 08/30/2015 06/09/2015  Falls in the past year? No No No   Depression Screen PHQ 2/9 Scores 12/15/2016 08/30/2015 06/09/2015 07/24/2013  PHQ - 2 Score 2 0 6 1  PHQ- 9 Score 3 - 13 -     Cognitive Function MMSE - Mini Mental State Exam 12/15/2016 08/30/2015  Orientation to time 5 5  Orientation to Place 5 5  Registration 3 3  Attention/ Calculation 0 0  Recall 3 3  Language- name 2 objects 0 0  Language- repeat 1 1  Language- follow 3 step command 3 3  Language- read & follow direction 0 0  Write a sentence 0 0  Copy design 0 0  Total score 20 20       PLEASE NOTE: A Mini-Cog screen was completed. Maximum score is 20. A value of 0 denotes this part of Folstein MMSE was not completed or the patient failed this part of the Mini-Cog screening.   Mini-Cog  Screening Orientation to Time - Max 5 pts Orientation to Place - Max 5 pts Registration - Max 3  pts Recall - Max 3 pts Language Repeat - Max 1 pts Language Follow 3 Step Command - Max 3 pts   Immunization History  Administered Date(s) Administered  . Influenza Split 03/27/2011, 01/23/2012  . Influenza,inj,Quad PF,6+ Mos 11/26/2012, 11/10/2013, 11/24/2016  . Pneumococcal Conjugate-13 08/21/2014  . Pneumococcal Polysaccharide-23 05/19/2008, 08/30/2015  . Td 03/13/2001  . Tdap 01/23/2012  . Zoster 01/07/2013   Screening Tests Health Maintenance  Topic Date Due  . DEXA SCAN  12/15/2048 (Originally 03/04/2014)  . MAMMOGRAM  05/27/2017  . COLONOSCOPY  11/11/2020  . TETANUS/TDAP  01/22/2022  . INFLUENZA VACCINE  Completed  . Hepatitis C Screening  Completed  . PNA vac Low Risk Adult  Completed      Plan:     I have personally reviewed and addressed the Medicare Annual Wellness questionnaire and have noted the following in the patient's chart:  A. Medical and social history B. Use of alcohol, tobacco or illicit drugs  C. Current medications and supplements D. Functional ability and status E.  Nutritional status F.  Physical activity G. Advance directives H. List of other physicians I.  Hospitalizations, surgeries, and ER visits in previous 12 months J.  [place] to include hearing, vision, cognitive, depression L. Referrals and appointments - none  In addition, I have reviewed and discussed with patient certain preventive protocols, quality metrics, and best practice recommendations. A written personalized care plan for preventive services as well as general preventive health recommendations were provided to patient.  See attached scanned questionnaire for additional information.   Signed,   Lindell Noe, MHA, BS, LPN Health Coach

## 2016-12-17 NOTE — Progress Notes (Signed)
PCP notes:   Health maintenance:  Bone density - pt declined  Abnormal screenings:   Depression score: 3 Depression screen [facility] 2/9 12/15/2016 08/30/2015 06/09/2015 07/24/2013 01/23/2012  Decreased Interest 1 0 3 0 0  Down, Depressed, Hopeless 1 0 3 1 0  PHQ - 2 Score 2 0 6 1 0  Altered sleeping 0 - 3 - -  Tired, decreased energy 1 - 3 - -  Change in appetite 0 - 1 - -  Feeling bad or failure about yourself  0 - 0 - -  Trouble concentrating 0 - 0 - -  Moving slowly or fidgety/restless 0 - 0 - -  Suicidal thoughts 0 - 0 - -  PHQ-9 Score 3 - 13 - -  Difficult doing work/chores Not difficult at all - Very difficult - -    Patient concerns:   None  Nurse concerns:  None  Next PCP appt:   10/10 @ 1130  I reviewed health advisor's note, was available for consultation, and agree with documentation and plan. Loura Pardon MD

## 2016-12-18 ENCOUNTER — Encounter: Payer: Self-pay | Admitting: *Deleted

## 2016-12-20 ENCOUNTER — Encounter: Payer: Self-pay | Admitting: Family Medicine

## 2016-12-20 ENCOUNTER — Ambulatory Visit (INDEPENDENT_AMBULATORY_CARE_PROVIDER_SITE_OTHER): Payer: Medicare Other | Admitting: Family Medicine

## 2016-12-20 VITALS — BP 118/74 | HR 103 | Temp 97.9°F | Ht 63.5 in | Wt 197.5 lb

## 2016-12-20 DIAGNOSIS — R739 Hyperglycemia, unspecified: Secondary | ICD-10-CM | POA: Diagnosis not present

## 2016-12-20 DIAGNOSIS — I10 Essential (primary) hypertension: Secondary | ICD-10-CM | POA: Diagnosis not present

## 2016-12-20 DIAGNOSIS — Z86010 Personal history of colonic polyps: Secondary | ICD-10-CM | POA: Diagnosis not present

## 2016-12-20 DIAGNOSIS — E7800 Pure hypercholesterolemia, unspecified: Secondary | ICD-10-CM

## 2016-12-20 DIAGNOSIS — F418 Other specified anxiety disorders: Secondary | ICD-10-CM

## 2016-12-20 DIAGNOSIS — R000 Tachycardia, unspecified: Secondary | ICD-10-CM | POA: Diagnosis not present

## 2016-12-20 MED ORDER — TOLTERODINE TARTRATE ER 4 MG PO CP24: 4.0000 mg | ORAL_CAPSULE | Freq: Every day | ORAL | 3 refills | Status: DC

## 2016-12-20 NOTE — Patient Instructions (Addendum)
Try to get 1200-1500 mg of calcium per day with at least 1000 iu of vitamin D - for bone health   Don't forget to make your mammogram appointment  Also follow up with your gyn doctor   Work towards 30 minutes per day of exercise   You are getting closer to diabetes with A1C of 6.3  Avoid sugar drinks and sweets  Try to get most of your carbohydrates from produce (with the exception of white potatoes)  Eat less bread/pasta/rice/snack foods/cereals/sweets and other items from the middle of the grocery store (processed carbs)   Take care of yourself

## 2016-12-20 NOTE — Telephone Encounter (Signed)
Completed 12/15/16
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
## 2016-12-21 NOTE — Assessment & Plan Note (Signed)
Lab Results  Component Value Date   HGBA1C 6.3 10/25/2016   This is up  disc imp of low glycemic diet and wt loss to prevent DM2

## 2016-12-21 NOTE — Assessment & Plan Note (Signed)
Improved slt today with less anx  Continue to follow

## 2016-12-21 NOTE — Assessment & Plan Note (Signed)
Overall doing better on current medications Reviewed stressors/ coping techniques/symptoms/ support sources/ tx options and side effects in detail today

## 2016-12-21 NOTE — Assessment & Plan Note (Signed)
bp in fair control at this time  BP Readings from Last 1 Encounters:  12/20/16 118/74   No changes needed Disc lifstyle change with low sodium diet and exercise  Labs reviewed

## 2016-12-21 NOTE — Assessment & Plan Note (Signed)
Disc goals for lipids and reasons to control them Rev labs with pt Rev low sat fat diet in detail Continue crestor and diet  Increase exercise

## 2016-12-21 NOTE — Assessment & Plan Note (Signed)
Colonoscopy utd 9/17 Due 2022 for recall

## 2016-12-25 ENCOUNTER — Telehealth: Payer: Self-pay | Admitting: *Deleted

## 2016-12-25 ENCOUNTER — Encounter: Payer: Self-pay | Admitting: Family Medicine

## 2016-12-25 NOTE — Telephone Encounter (Signed)
Pt left v/m requesting cb (did not leave contact #) about what to do since [facility] does not have buspar for panic attacks. Pt request cb with what to do?

## 2016-12-25 NOTE — Telephone Encounter (Signed)
Received form from pharmacy stating that insurance does not cover Tolterodine 4 mg . There is a list of medications on the form that they prefer or a PA will need to be done. t. Form is on your desk.

## 2016-12-26 ENCOUNTER — Other Ambulatory Visit: Payer: Self-pay | Admitting: *Deleted

## 2016-12-26 MED ORDER — SOLIFENACIN SUCCINATE 10 MG PO TABS: 10.0000 mg | ORAL_TABLET | Freq: Every day | ORAL | 11 refills | Status: DC

## 2016-12-26 MED ORDER — ALBUTEROL SULFATE HFA 108 (90 BASE) MCG/ACT IN AERS: 2.0000 | INHALATION_SPRAY | Freq: Four times a day (QID) | RESPIRATORY_TRACT | 3 refills | Status: DC | PRN

## 2016-12-26 MED ORDER — PAROXETINE HCL 40 MG PO TABS
ORAL_TABLET | ORAL | 3 refills | Status: DC
Start: 2016-12-26 — End: 2017-12-25

## 2016-12-26 NOTE — Telephone Encounter (Signed)
I would like to change to vesicare - I think it will work well  If she is agreeable let me know and I will send it to her pharmacy

## 2016-12-26 NOTE — Telephone Encounter (Signed)
Spoke to pt who agrees with changing to vesicare and can be sent to [facility]

## 2016-12-26 NOTE — Telephone Encounter (Signed)
I sent it  Hope it works well

## 2017-01-19 ENCOUNTER — Telehealth: Payer: Self-pay | Admitting: *Deleted

## 2017-01-19 NOTE — Telephone Encounter (Signed)
PA for pt's Proair was done a [email or web address], it was approved and pharmacy advised and letter placed in Dr. Marliss Coots inbox to sign and send for scanning when she returns

## 2017-01-26 ENCOUNTER — Other Ambulatory Visit: Payer: Self-pay | Admitting: Family Medicine

## 2017-01-29 NOTE — Telephone Encounter (Signed)
Rx called to pharmacy as instructed. 

## 2017-01-29 NOTE — Telephone Encounter (Signed)
Px written for call in   

## 2017-01-29 NOTE — Telephone Encounter (Signed)
CPE was on 12/20/16, last filled on 10/25/16 #90 tabs with 0 refills, please advise

## 2017-02-15 ENCOUNTER — Other Ambulatory Visit: Payer: Self-pay

## 2017-02-21 DIAGNOSIS — M5136 Other intervertebral disc degeneration, lumbar region: Secondary | ICD-10-CM | POA: Diagnosis not present

## 2017-02-21 DIAGNOSIS — M9903 Segmental and somatic dysfunction of lumbar region: Secondary | ICD-10-CM | POA: Diagnosis not present

## 2017-02-21 DIAGNOSIS — M9904 Segmental and somatic dysfunction of sacral region: Secondary | ICD-10-CM | POA: Diagnosis not present

## 2017-02-21 DIAGNOSIS — M9905 Segmental and somatic dysfunction of pelvic region: Secondary | ICD-10-CM | POA: Diagnosis not present

## 2017-02-22 DIAGNOSIS — M9903 Segmental and somatic dysfunction of lumbar region: Secondary | ICD-10-CM | POA: Diagnosis not present

## 2017-02-22 DIAGNOSIS — M5136 Other intervertebral disc degeneration, lumbar region: Secondary | ICD-10-CM | POA: Diagnosis not present

## 2017-02-22 DIAGNOSIS — M9904 Segmental and somatic dysfunction of sacral region: Secondary | ICD-10-CM | POA: Diagnosis not present

## 2017-02-22 DIAGNOSIS — M9905 Segmental and somatic dysfunction of pelvic region: Secondary | ICD-10-CM | POA: Diagnosis not present

## 2017-02-26 DIAGNOSIS — M9903 Segmental and somatic dysfunction of lumbar region: Secondary | ICD-10-CM | POA: Diagnosis not present

## 2017-02-26 DIAGNOSIS — M9905 Segmental and somatic dysfunction of pelvic region: Secondary | ICD-10-CM | POA: Diagnosis not present

## 2017-02-26 DIAGNOSIS — M9904 Segmental and somatic dysfunction of sacral region: Secondary | ICD-10-CM | POA: Diagnosis not present

## 2017-02-26 DIAGNOSIS — M5136 Other intervertebral disc degeneration, lumbar region: Secondary | ICD-10-CM | POA: Diagnosis not present

## 2017-04-05 ENCOUNTER — Other Ambulatory Visit: Payer: Self-pay | Admitting: Family Medicine

## 2017-04-05 NOTE — Telephone Encounter (Signed)
CPE on 12/20/16, last filled on 01/29/17 #30 tabs with 0 refills

## 2017-04-13 ENCOUNTER — Ambulatory Visit: Payer: Self-pay | Admitting: Family Medicine

## 2017-05-28 ENCOUNTER — Other Ambulatory Visit: Payer: Self-pay | Admitting: Family Medicine

## 2017-05-28 ENCOUNTER — Other Ambulatory Visit: Payer: Self-pay | Admitting: Obstetrics & Gynecology

## 2017-05-28 ENCOUNTER — Ambulatory Visit
Admission: RE | Admit: 2017-05-28 | Discharge: 2017-05-28 | Disposition: A | Discharge status: Home / Self Care | Payer: Medicare Other | Source: Ambulatory Visit | Attending: Family Medicine | Admitting: Family Medicine

## 2017-05-28 DIAGNOSIS — Z1231 Encounter for screening mammogram for malignant neoplasm of breast: Secondary | ICD-10-CM

## 2017-08-08 ENCOUNTER — Other Ambulatory Visit: Payer: Self-pay | Admitting: Family Medicine

## 2017-08-08 NOTE — Telephone Encounter (Signed)
CPE was on 12/20/16, last filled on 04/05/17 #30 tabs with 3 additional refills

## 2017-09-05 ENCOUNTER — Other Ambulatory Visit: Payer: Self-pay | Admitting: Family Medicine

## 2017-10-08 ENCOUNTER — Other Ambulatory Visit: Payer: Self-pay | Admitting: Family Medicine

## 2017-11-14 ENCOUNTER — Other Ambulatory Visit: Payer: Self-pay | Admitting: Family Medicine

## 2017-12-05 ENCOUNTER — Other Ambulatory Visit: Payer: Self-pay | Admitting: Family Medicine

## 2017-12-05 NOTE — Telephone Encounter (Signed)
Please schedule a fall f/u and refill until then  

## 2017-12-05 NOTE — Telephone Encounter (Signed)
No recent or future appts., please advise  

## 2017-12-06 NOTE — Telephone Encounter (Signed)
[facility] will reach out to pt to get appt scheduled. Med refilled once

## 2017-12-12 ENCOUNTER — Other Ambulatory Visit: Payer: Self-pay | Admitting: Family Medicine

## 2017-12-12 NOTE — Telephone Encounter (Signed)
Name of Medication: Ambien  Name of Pharmacy: [facility] Date and Quantity: 08/08/17 #30 tabs with 3 additional refills Last Office Visit and Type: CPE on 12/20/16 Next Office Visit and Type: F/U on 12/25/17 Last Controlled Substance Agreement Date: 07/13/15 Last UDS:07/13/15

## 2017-12-25 ENCOUNTER — Ambulatory Visit (INDEPENDENT_AMBULATORY_CARE_PROVIDER_SITE_OTHER): Payer: Medicare Other | Admitting: Family Medicine

## 2017-12-25 ENCOUNTER — Encounter: Payer: Self-pay | Admitting: Family Medicine

## 2017-12-25 VITALS — BP 116/68 | HR 110 | Temp 97.9°F | Ht 63.5 in | Wt 195.5 lb

## 2017-12-25 DIAGNOSIS — E7800 Pure hypercholesterolemia, unspecified: Secondary | ICD-10-CM

## 2017-12-25 DIAGNOSIS — R739 Hyperglycemia, unspecified: Secondary | ICD-10-CM

## 2017-12-25 DIAGNOSIS — I10 Essential (primary) hypertension: Secondary | ICD-10-CM

## 2017-12-25 DIAGNOSIS — Z23 Encounter for immunization: Secondary | ICD-10-CM | POA: Diagnosis not present

## 2017-12-25 DIAGNOSIS — R000 Tachycardia, unspecified: Secondary | ICD-10-CM

## 2017-12-25 DIAGNOSIS — F418 Other specified anxiety disorders: Secondary | ICD-10-CM

## 2017-12-25 LAB — CBC WITH DIFFERENTIAL/PLATELET
BASOS ABS: 0.1 10*3/uL (ref 0.0–0.1)
BASOS PCT: 0.8 % (ref 0.0–3.0)
EOS ABS: 0.1 10*3/uL (ref 0.0–0.7)
Eosinophils Relative: 0.4 % (ref 0.0–5.0)
HEMATOCRIT: 38.6 % (ref 36.0–46.0)
Hemoglobin: 12.7 g/dL (ref 12.0–15.0)
LYMPHS PCT: 19 % (ref 12.0–46.0)
Lymphs Abs: 2.8 10*3/uL (ref 0.7–4.0)
MCHC: 32.8 g/dL (ref 30.0–36.0)
MCV: 88.6 fl (ref 78.0–100.0)
Monocytes Absolute: 3.4 10*3/uL — ABNORMAL HIGH (ref 0.1–1.0)
Monocytes Relative: 22.9 % — ABNORMAL HIGH (ref 3.0–12.0)
Neutro Abs: 8.4 10*3/uL — ABNORMAL HIGH (ref 1.4–7.7)
Neutrophils Relative %: 56.9 % (ref 43.0–77.0)
Platelets: 248 10*3/uL (ref 150.0–400.0)
RBC: 4.36 Mil/uL (ref 3.87–5.11)
RDW: 13.4 % (ref 11.5–15.5)
WBC: 14.7 10*3/uL — AB (ref 4.0–10.5)

## 2017-12-25 LAB — LIPID PANEL
CHOL/HDL RATIO: 3
CHOLESTEROL: 119 mg/dL (ref 0–200)
HDL: 45.9 mg/dL (ref >39.00)
LDL Cholesterol: 48 mg/dL (ref 0–99)
NonHDL: 73.38
TRIGLYCERIDES: 125 mg/dL (ref 0.0–149.0)
VLDL: 25 mg/dL (ref 0.0–40.0)

## 2017-12-25 LAB — COMPREHENSIVE METABOLIC PANEL
ALBUMIN: 4.2 g/dL (ref 3.5–5.2)
ALK PHOS: 74 U/L (ref 39–117)
ALT: 14 U/L (ref 0–35)
AST: 17 U/L (ref 0–37)
BILIRUBIN TOTAL: 0.3 mg/dL (ref 0.2–1.2)
BUN: 13 mg/dL (ref 6–23)
CALCIUM: 10 mg/dL (ref 8.4–10.5)
CO2: 29 meq/L (ref 19–32)
CREATININE: 0.6 mg/dL (ref 0.40–1.20)
Chloride: 101 mEq/L (ref 96–112)
GFR: 105.41 mL/min (ref >60.00)
Glucose, Bld: 114 mg/dL — ABNORMAL HIGH (ref 70–99)
Potassium: 4.5 mEq/L (ref 3.5–5.1)
Sodium: 138 mEq/L (ref 135–145)
TOTAL PROTEIN: 7.7 g/dL (ref 6.0–8.3)

## 2017-12-25 LAB — TSH: TSH: 1.77 u[IU]/mL (ref 0.35–4.50)

## 2017-12-25 LAB — HEMOGLOBIN A1C: HEMOGLOBIN A1C: 6.3 % (ref 4.6–6.5)

## 2017-12-25 MED ORDER — PAROXETINE HCL 40 MG PO TABS: ORAL_TABLET | ORAL | 3 refills | Status: DC

## 2017-12-25 MED ORDER — BUSPIRONE HCL 15 MG PO TABS: 15.0000 mg | ORAL_TABLET | Freq: Three times a day (TID) | ORAL | 3 refills | Status: DC

## 2017-12-25 MED ORDER — LISINOPRIL-HYDROCHLOROTHIAZIDE 10-12.5 MG PO TABS: 1.0000 | ORAL_TABLET | Freq: Every day | ORAL | 3 refills | Status: DC

## 2017-12-25 MED ORDER — ROSUVASTATIN CALCIUM 10 MG PO TABS: ORAL_TABLET | ORAL | 3 refills | Status: DC

## 2017-12-25 NOTE — Patient Instructions (Addendum)
Try to increase exercise (bike) to 30 minutes 5 days per week  Fitness helps energy and mood   Increase your buspar to three times daily  Take care of yourself  Keep eating a healthy diet   Labs today  Flu shot today   Follow up in 3 months to check heart rate again

## 2017-12-25 NOTE — Assessment & Plan Note (Signed)
Disc goals for lipids and reasons to control them Rev last labs with pt Rev low sat fat diet in detail Lab today crestor and diet

## 2017-12-25 NOTE — Assessment & Plan Note (Signed)
Ongoing-worse when anxious Lab today  Disc inc buspar to tid for anx Also work on fitness F/u 3 mo  If no imp -consider rate lowering agent

## 2017-12-25 NOTE — Progress Notes (Signed)
Subjective:    Patient ID: Anita Harrell, female    DOB: [birth year], 67 y.o.   MRN: [id number]  HPI Here for f/u of chronic health problems  Wt Readings from Last 3 Encounters:  12/25/17 195 lb 8 oz (88.7 kg)  12/20/16 197 lb 8 oz (89.6 kg)  12/15/16 195 lb 8 oz (88.7 kg)  stable  Exercises - 3 times per week (exercise bike 30 min)  34.09 kg/m   Given flu shot today   bp is stable today  No cp or palpitations or headaches or edema  No side effects to medicines  BP Readings from Last 3 Encounters:  12/25/17 116/68  12/20/16 118/74  12/15/16 118/80    Takes lisinopril hct   Pulse Readings from Last 3 Encounters:  12/25/17 (!) 110  12/20/16 (!) 103  12/15/16 94  pulse goes up when she is nervous   Takes paxil for depression and anxiety -doing much much better  Added buspar bid and that helped  ambien for sleep   Prediabetes Lab Results  Component Value Date   HGBA1C 6.3 10/25/2016  due for labs  Doing really good with diet  Avoids potatoes  Eats more broccoli  Salmon patties  Avoids sweets     Hyperlipidemia Lab Results  Component Value Date   CHOL 116 10/25/2016   HDL 43.70 10/25/2016   LDLCALC 41 10/25/2016   LDLDIRECT 73.0 08/21/2014   TRIG 155.0 (H) 10/25/2016   CHOLHDL 3 10/25/2016   Due for labs  crestor and diet   Patient Active Problem List   Diagnosis Date Noted  . Tachycardia 11/01/2015  . Microscopic hematuria 08/13/2012  . Routine general medical examination at a health care facility 07/22/2012  . Leukocytosis 03/30/2010  . Hyperglycemia 02/21/2008  . HYPERTENSION, BENIGN ESSENTIAL 01/22/2008  . ALLERGIC RHINITIS 07/25/2007  . BACK PAIN 06/26/2007  . Personal history of goiter 07/27/2006  . Hyperlipidemia 07/27/2006  . Anxiety disorder 07/27/2006  . PANIC DISORDER 07/27/2006  . Former smoker 07/27/2006  . Depression with anxiety 07/27/2006  . INSOMNIA, HX OF 07/27/2006  . Personal history of colonic polyps 06/09/2004    Past Medical History:  Diagnosis Date  . Allergy    allergic rhinitis  . Anxiety   . Back pain   . Chest pain   . Depression   . Epigastric pain   . Goiter   . History of tobacco abuse   . Hyperglycemia    mild  . Hyperlipidemia   . Hypertension   . Insomnia    Hx of  . Labile blood pressure   . Panic disorder    History of  . Personal history of colonic polyps 06/09/2004  . SVD (spontaneous vaginal delivery)    x 2   Past Surgical History:  Procedure Laterality Date  . ABDOMINAL HYSTERECTOMY N/A 09/24/2012   Procedure: HYSTERECTOMY ABDOMINAL;  Surgeon: Gus Height, MD;  Location: [facility];  Service: Gynecology;  Laterality: N/A;  . BREAST BIOPSY    . COLONOSCOPY    . COLONOSCOPY  2017  . ELBOW SURGERY  2004  . EYE SURGERY     bilateral lasik  . SALPINGOOPHORECTOMY Bilateral 09/24/2012   Procedure: SALPINGO OOPHORECTOMY;  Surgeon: Gus Height, MD;  Location: [facility];  Service: Gynecology;  Laterality: Bilateral;  . THYROID SURGERY     B9 massess  . WART FULGURATION N/A 09/24/2012   Procedure: FULGURATION VAGINAL WART;  Surgeon: Gus Height, MD;  Location: [facility];  Service:  Gynecology;  Laterality: N/A;  . WISDOM TOOTH EXTRACTION     Social History   Tobacco Use  . Smoking status: Former Smoker    Packs/day: 1.00    Years: 20.00    Pack years: 20.00    Types: Cigarettes    Last attempt to quit: 12/11/2008    Years since quitting: 9.0  . Smokeless tobacco: Never Used  Substance Use Topics  . Alcohol use: No    Alcohol/week: 0.0 standard drinks  . Drug use: No   Family History  Problem Relation Age of Onset  . Hypertension Mother   . Stroke Mother   . Alcohol abuse Father   . Cancer Father        lung CA  . Heart disease Father        CHF  . Cancer Sister        breast CA  . Heart disease Sister        CHF from chemotx also has defib  . Breast cancer Sister   . Colon cancer Neg Hx   . Esophageal cancer Neg Hx   . Rectal cancer Neg Hx   . Stomach cancer  Neg Hx    Allergies  Allergen Reactions  . Codeine Nausea And Vomiting    REACTION: Nausea and vomiting Pt has tolerated vicodin & percocet in the past  . Lipitor [Atorvastatin]     Muscle pain   . Wellbutrin [Bupropion]    Current Outpatient Medications on File Prior to Visit  Medication Sig Dispense Refill  . albuterol (PROAIR HFA) 108 (90 Base) MCG/ACT inhaler Inhale 2 puffs into the lungs every 6 (six) hours as needed for wheezing or shortness of breath. 18 g 3  . ALPRAZolam (XANAX) 0.5 MG tablet Take 1 tablet (0.5 mg total) by mouth daily as needed for anxiety. 30 tablet 3  . aspirin 81 MG tablet Take 81 mg by mouth daily.    . Omega-3 Fatty Acids (FISH OIL PO) Take 1 capsule by mouth daily.    . solifenacin (VESICARE) 10 MG tablet Take 1 tablet (10 mg total) by mouth daily. 30 tablet 11  . zolpidem (AMBIEN) 10 MG tablet TAKE 1 TABLET BY MOUTH AT BEDTIME AS NEEDED 30 tablet 3   Current Facility-Administered Medications on File Prior to Visit  Medication Dose Route Frequency Provider Last Rate Last Dose  . 0.9 %  sodium chloride infusion  500 mL Intravenous Continuous Gatha Mayer, MD        Review of Systems  Constitutional: Negative for activity change, appetite change, fatigue, fever and unexpected weight change.  HENT: Negative for congestion, ear pain, rhinorrhea, sinus pressure and sore throat.   Eyes: Negative for pain, redness and visual disturbance.  Respiratory: Negative for cough, shortness of breath and wheezing.   Cardiovascular: Negative for chest pain and palpitations.       High HR at times No palpitations   Gastrointestinal: Negative for abdominal pain, blood in stool, constipation and diarrhea.  Endocrine: Negative for polydipsia and polyuria.  Genitourinary: Negative for dysuria, frequency and urgency.  Musculoskeletal: Negative for arthralgias, back pain and myalgias.  Skin: Negative for pallor and rash.  Allergic/Immunologic: Negative for environmental  allergies.  Neurological: Negative for dizziness, syncope and headaches.  Hematological: Negative for adenopathy. Does not bruise/bleed easily.  Psychiatric/Behavioral: Negative for decreased concentration and dysphoric mood. The patient is nervous/anxious.        Objective:   Physical Exam  Constitutional: She appears well-developed  and well-nourished. No distress.  obese and well appearing   HENT:  Head: Normocephalic and atraumatic.  Mouth/Throat: Oropharynx is clear and moist.  Eyes: Pupils are equal, round, and reactive to light. Conjunctivae and EOM are normal.  Neck: Normal range of motion. Neck supple. No JVD present. Carotid bruit is not present. No thyromegaly present.  Cardiovascular: Normal rate, regular rhythm, normal heart sounds and intact distal pulses. Exam reveals no gallop.  Pulmonary/Chest: Effort normal and breath sounds normal. No respiratory distress. She has no wheezes. She has no rales.  No crackles  Abdominal: Soft. Bowel sounds are normal. She exhibits no distension, no abdominal bruit and no mass. There is no tenderness.  Musculoskeletal: She exhibits no edema.  Lymphadenopathy:    She has no cervical adenopathy.  Neurological: She is alert. She has normal reflexes. She displays no tremor and normal reflexes. No cranial nerve deficit. Coordination normal.  Skin: Skin is warm and dry. No rash noted. No erythema.  Psychiatric: She has a normal mood and affect. Cognition and memory are normal.  Anxiety is much improved           Assessment & Plan:   Problem List Items Addressed This Visit      Cardiovascular and Mediastinum   HYPERTENSION, BENIGN ESSENTIAL    bp in fair control at this time  BP Readings from Last 1 Encounters:  12/25/17 116/68   No changes needed Most recent labs reviewed  Disc lifstyle change with low sodium diet and exercise  On ace/hct Labs today        Relevant Medications   lisinopril-hydrochlorothiazide  (PRINZIDE,ZESTORETIC) 10-12.5 MG tablet   rosuvastatin (CRESTOR) 10 MG tablet   Other Relevant Orders   CBC with Differential/Platelet   Comprehensive metabolic panel   Lipid panel   TSH     Other   Depression with anxiety - Primary    Much imp with addition of buspar Will inc dosing to tid as well (tolerated well) Continue paxil  Reviewed stressors/ coping techniques/symptoms/ support sources/ tx options and side effects in detail today Enc her to stay active and social and physically fit       Relevant Medications   busPIRone (BUSPAR) 15 MG tablet   PARoxetine (PAXIL) 40 MG tablet   Hyperglycemia    A1C today  Pt states diet is improved disc imp of low glycemic diet and wt loss to prevent DM2       Relevant Orders   Hemoglobin A1c   Hyperlipidemia    Disc goals for lipids and reasons to control them Rev last labs with pt Rev low sat fat diet in detail Lab today crestor and diet      Relevant Medications   lisinopril-hydrochlorothiazide (PRINZIDE,ZESTORETIC) 10-12.5 MG tablet   rosuvastatin (CRESTOR) 10 MG tablet   Other Relevant Orders   Lipid panel   Tachycardia    Ongoing-worse when anxious Lab today  Disc inc buspar to tid for anx Also work on fitness F/u 3 mo  If no imp -consider rate lowering agent       Other Visit Diagnoses    Need for influenza vaccination       Relevant Orders   Flu Vaccine QUAD 6+ mos PF IM (Fluarix Quad PF) (Completed)

## 2017-12-25 NOTE — Assessment & Plan Note (Signed)
bp in fair control at this time  BP Readings from Last 1 Encounters:  12/25/17 116/68   No changes needed Most recent labs reviewed  Disc lifstyle change with low sodium diet and exercise  On ace/hct Labs today

## 2017-12-25 NOTE — Assessment & Plan Note (Signed)
A1C today  Pt states diet is improved disc imp of low glycemic diet and wt loss to prevent DM2

## 2017-12-25 NOTE — Assessment & Plan Note (Signed)
Much imp with addition of buspar Will inc dosing to tid as well (tolerated well) Continue paxil  Reviewed stressors/ coping techniques/symptoms/ support sources/ tx options and side effects in detail today Enc her to stay active and social and physically fit

## 2017-12-31 ENCOUNTER — Ambulatory Visit (INDEPENDENT_AMBULATORY_CARE_PROVIDER_SITE_OTHER): Payer: Medicare Other | Admitting: Family Medicine

## 2017-12-31 ENCOUNTER — Encounter: Payer: Self-pay | Admitting: Family Medicine

## 2017-12-31 DIAGNOSIS — R059 Cough, unspecified: Secondary | ICD-10-CM

## 2017-12-31 DIAGNOSIS — R05 Cough: Secondary | ICD-10-CM | POA: Diagnosis not present

## 2017-12-31 MED ORDER — ALBUTEROL SULFATE HFA 108 (90 BASE) MCG/ACT IN AERS: 2.0000 | INHALATION_SPRAY | Freq: Four times a day (QID) | RESPIRATORY_TRACT | 1 refills | Status: DC | PRN

## 2017-12-31 NOTE — Progress Notes (Signed)
She is out of SABA.  No recently use.    She had a cold prior to recent OV with CBC noted to have elevated WBC.  That cold/URI resolved.  She was feeling well until 12/28/17.  Started with a cough, with post tussive emesis over the weekend. Chest was sore from coughing.  Fever PM 12/29/17.  Had atypical dreams at night but not hallucinating during the day.  Last night vomited phlegm after coughing.  She feels some better today but with fatigue and dec in appetite.    Meds, vitals, and allergies reviewed.   ROS: Per HPI unless specifically indicated in ROS section   GEN: nad, alert and oriented HEENT: mucous membranes moist, tm w/o erythema, nasal exam w/o erythema, clear discharge noted,  OP with cobblestoning NECK: supple w/o LA CV: rrr.   PULM: ctab, no inc wob, no focal decrease in breath sounds EXT: no edema SKIN: well perfused.

## 2017-12-31 NOTE — Patient Instructions (Signed)
Your lungs are clear.   Your feel some better today.  Likely that you had back to back non-flu viral illnesses that should self resolve.   Rest and fluids in the meantime.  Tylenol for fever.   Eat as your appetite comes back.   Use albuterol for the cough.   It may help you get some sputum up.   Take care.  Glad to see you.

## 2018-01-01 DIAGNOSIS — J410 Simple chronic bronchitis: Secondary | ICD-10-CM | POA: Insufficient documentation

## 2018-01-01 DIAGNOSIS — R05 Cough: Secondary | ICD-10-CM | POA: Insufficient documentation

## 2018-01-01 NOTE — Assessment & Plan Note (Signed)
Lungs are clear.   She feels some better today.  Likely that she had back to back non-flu viral illnesses that should self resolve.   Rest and fluids in the meantime.  Tylenol for fever.   ADAT, as appetite comes back.   Use albuterol for the cough.   It may help with pulmonary toilet.  Update [place] as needed.  Okay for outpatient follow-up.  Discussed with patient.

## 2018-01-21 ENCOUNTER — Other Ambulatory Visit: Payer: Self-pay | Admitting: Family Medicine

## 2018-01-21 DIAGNOSIS — D72829 Elevated white blood cell count, unspecified: Secondary | ICD-10-CM

## 2018-01-28 ENCOUNTER — Encounter: Payer: Self-pay | Admitting: Family Medicine

## 2018-01-29 ENCOUNTER — Encounter: Payer: Self-pay | Admitting: Family Medicine

## 2018-01-29 ENCOUNTER — Ambulatory Visit (INDEPENDENT_AMBULATORY_CARE_PROVIDER_SITE_OTHER)
Admission: RE | Admit: 2018-01-29 | Discharge: 2018-01-29 | Disposition: A | Discharge status: Home / Self Care | Payer: Medicare Other | Source: Ambulatory Visit | Attending: Family Medicine | Admitting: Family Medicine

## 2018-01-29 ENCOUNTER — Ambulatory Visit (INDEPENDENT_AMBULATORY_CARE_PROVIDER_SITE_OTHER): Payer: Medicare Other | Admitting: Family Medicine

## 2018-01-29 ENCOUNTER — Telehealth: Payer: Self-pay | Admitting: Family Medicine

## 2018-01-29 ENCOUNTER — Ambulatory Visit: Payer: Self-pay | Admitting: Family Medicine

## 2018-01-29 ENCOUNTER — Other Ambulatory Visit: Payer: Medicare Other

## 2018-01-29 VITALS — BP 124/66 | HR 126 | Temp 97.9°F | Ht 63.5 in | Wt 194.2 lb

## 2018-01-29 DIAGNOSIS — R042 Hemoptysis: Secondary | ICD-10-CM | POA: Insufficient documentation

## 2018-01-29 DIAGNOSIS — R000 Tachycardia, unspecified: Secondary | ICD-10-CM

## 2018-01-29 DIAGNOSIS — R9389 Abnormal findings on diagnostic imaging of other specified body structures: Secondary | ICD-10-CM | POA: Insufficient documentation

## 2018-01-29 DIAGNOSIS — R05 Cough: Secondary | ICD-10-CM | POA: Diagnosis not present

## 2018-01-29 DIAGNOSIS — D72829 Elevated white blood cell count, unspecified: Secondary | ICD-10-CM | POA: Diagnosis not present

## 2018-01-29 DIAGNOSIS — J209 Acute bronchitis, unspecified: Secondary | ICD-10-CM

## 2018-01-29 MED ORDER — HYDROCODONE-HOMATROPINE 5-1.5 MG/5ML PO SYRP: 5.0000 mL | ORAL_SOLUTION | Freq: Three times a day (TID) | ORAL | 0 refills | Status: DC | PRN

## 2018-01-29 MED ORDER — DOXYCYCLINE HYCLATE 100 MG PO TABS: 100.0000 mg | ORAL_TABLET | Freq: Two times a day (BID) | ORAL | 0 refills | Status: DC

## 2018-01-29 NOTE — Patient Instructions (Addendum)
Drink lots of fluids Rest  Take the doxycycline as directed  Cough syrup as needed   mucinex DM is helpful over the counter as well for congestion and cough   Chest xray today   Update if not starting to improve in a week or if worsening  (if continued fever or shortness of breath or worse cough)   Follow up in 1 month for labs and visit to discuss heart rate

## 2018-01-29 NOTE — Telephone Encounter (Signed)
-----   Message from Tammi Sou, [place] sent at 01/29/2018  4:57 PM EST ----- Pt notified of xray results an Dr. Marliss Coots comments and recommendations. Pt does agree with CT order. I advise pt our [facility] will call tomorrow to schedule appt, please put order in

## 2018-01-29 NOTE — Telephone Encounter (Signed)
Please call pt to schedule CT chest First available Thanks

## 2018-01-29 NOTE — Assessment & Plan Note (Signed)
Higher than baseline today - but also ill  Plan f/u to re check

## 2018-01-29 NOTE — Assessment & Plan Note (Signed)
S/p recent uri  Some hemoptysis (also prior smoker) cxr today  Cover with doxycyline  Fluids/rest Hycodan for cough with caution  Expectorant prn otc Update if not starting to improve in a week or if worsening

## 2018-01-29 NOTE — Assessment & Plan Note (Signed)
cxr today -pending result In conjunction with bad cough /illness Prior smoker

## 2018-01-29 NOTE — Assessment & Plan Note (Signed)
Will plan to re check this later as she is sick today and that may falsely elevate it  F/u in approx 1 mo - will re check with path review

## 2018-01-29 NOTE — Progress Notes (Signed)
Subjective:    Patient ID: Anita Harrell, female    DOB: 1951-01-21, 67 y.o.   MRN: [id number]  HPI Here for cough /nasal congestion and fever   Had a virus in October  diag with a viral - had it for a week and got better  A week after that she started with head congestion / phlegm - got mostly better   This bout started about 10 days ago  Prod cough (in bouts)  Spitting out phlegm -- yellow and sometimes with blood   No wheezing  Cough can sound a little barky  Back is sore from coughing  No chest pain or burning   Throat -is ok  Appetite is good  Feels run down  Ears -occ ringing  No sinus pain   Some nasal cong-also yellow d/c  No facial pain   The other night - had chills  101 temp (none since) - did take analgesic   Wt Readings from Last 3 Encounters:  01/29/18 194 lb 4 oz (88.1 kg)  12/31/17 192 lb 12 oz (87.4 kg)  12/25/17 195 lb 8 oz (88.7 kg)   33.87 kg/m   Temp: 97.9 F (36.6 C)  Pulse Readings from Last 3 Encounters:  01/29/18 (!) 126  12/31/17 (!) 118  12/25/17 (!) 110   Patient Active Problem List   Diagnosis Date Noted  . Acute bronchitis 01/29/2018  . Hemoptysis 01/29/2018  . Cough 01/01/2018  . Tachycardia 11/01/2015  . Microscopic hematuria 08/13/2012  . Routine general medical examination at a health care facility 07/22/2012  . Leukocytosis 03/30/2010  . Hyperglycemia 02/21/2008  . HYPERTENSION, BENIGN ESSENTIAL 01/22/2008  . ALLERGIC RHINITIS 07/25/2007  . BACK PAIN 06/26/2007  . Personal history of goiter 07/27/2006  . Hyperlipidemia 07/27/2006  . Anxiety disorder 07/27/2006  . PANIC DISORDER 07/27/2006  . Former smoker 07/27/2006  . Depression with anxiety 07/27/2006  . INSOMNIA, HX OF 07/27/2006  . Personal history of colonic polyps 06/09/2004   Past Medical History:  Diagnosis Date  . Allergy    allergic rhinitis  . Anxiety   . Back pain   . Chest pain   . Depression   . Epigastric pain   . Goiter   . History  of tobacco abuse   . Hyperglycemia    mild  . Hyperlipidemia   . Hypertension   . Insomnia    Hx of  . Labile blood pressure   . Panic disorder    History of  . Personal history of colonic polyps 06/09/2004  . SVD (spontaneous vaginal delivery)    x 2   Past Surgical History:  Procedure Laterality Date  . ABDOMINAL HYSTERECTOMY N/A 09/24/2012   Procedure: HYSTERECTOMY ABDOMINAL;  Surgeon: Gus Height, MD;  Location: [facility];  Service: Gynecology;  Laterality: N/A;  . BREAST BIOPSY    . COLONOSCOPY    . COLONOSCOPY  2017  . ELBOW SURGERY  2004  . EYE SURGERY     bilateral lasik  . SALPINGOOPHORECTOMY Bilateral 09/24/2012   Procedure: SALPINGO OOPHORECTOMY;  Surgeon: Gus Height, MD;  Location: [facility];  Service: Gynecology;  Laterality: Bilateral;  . THYROID SURGERY     B9 massess  . WART FULGURATION N/A 09/24/2012   Procedure: FULGURATION VAGINAL WART;  Surgeon: Gus Height, MD;  Location: [facility];  Service: Gynecology;  Laterality: N/A;  . WISDOM TOOTH EXTRACTION     Social History   Tobacco Use  . Smoking status: Former Smoker  Packs/day: 1.00    Years: 20.00    Pack years: 20.00    Types: Cigarettes    Last attempt to quit: 12/11/2008    Years since quitting: 9.1  . Smokeless tobacco: Never Used  Substance Use Topics  . Alcohol use: No    Alcohol/week: 0.0 standard drinks  . Drug use: No   Family History  Problem Relation Age of Onset  . Hypertension Mother   . Stroke Mother   . Alcohol abuse Father   . Cancer Father        lung CA  . Heart disease Father        CHF  . Cancer Sister        breast CA  . Heart disease Sister        CHF from chemotx also has defib  . Breast cancer Sister   . Colon cancer Neg Hx   . Esophageal cancer Neg Hx   . Rectal cancer Neg Hx   . Stomach cancer Neg Hx    Allergies  Allergen Reactions  . Codeine Nausea And Vomiting    REACTION: Nausea and vomiting Pt has tolerated vicodin & percocet in the past  . Lipitor  [Atorvastatin]     Muscle pain   . Wellbutrin [Bupropion]    Current Outpatient Medications on File Prior to Visit  Medication Sig Dispense Refill  . albuterol (PROAIR HFA) 108 (90 Base) MCG/ACT inhaler Inhale 2 puffs into the lungs every 6 (six) hours as needed (cough). 18 g 1  . aspirin 81 MG tablet Take 81 mg by mouth daily.    . busPIRone (BUSPAR) 15 MG tablet Take 1 tablet (15 mg total) by mouth 3 (three) times daily. 270 tablet 3  . lisinopril-hydrochlorothiazide (PRINZIDE,ZESTORETIC) 10-12.5 MG tablet Take 1 tablet by mouth daily. 90 tablet 3  . Omega-3 Fatty Acids (FISH OIL PO) Take 1 capsule by mouth daily.    Marland Kitchen PARoxetine (PAXIL) 40 MG tablet TAKE 1.5 TABLETS BY MOUTH ONCE DAILY 135 tablet 3  . rosuvastatin (CRESTOR) 10 MG tablet TAKE 1 TABLET BY MOUTH ONCE DAILY 90 tablet 3  . solifenacin (VESICARE) 10 MG tablet Take 1 tablet (10 mg total) by mouth daily. 30 tablet 11  . zolpidem (AMBIEN) 10 MG tablet TAKE 1 TABLET BY MOUTH AT BEDTIME AS NEEDED 30 tablet 3   No current facility-administered medications on file prior to visit.      Review of Systems  Constitutional: Positive for appetite change, fatigue and fever.       Had a fever- better now   HENT: Positive for congestion, postnasal drip, rhinorrhea, sinus pressure, sneezing, sore throat and tinnitus. Negative for ear pain, trouble swallowing and voice change.   Eyes: Negative for pain and discharge.  Respiratory: Positive for cough. Negative for choking, chest tightness, shortness of breath, wheezing and stridor.   Cardiovascular: Negative for chest pain.  Gastrointestinal: Negative for diarrhea, nausea and vomiting.  Genitourinary: Negative for frequency, hematuria and urgency.  Musculoskeletal: Negative for arthralgias and myalgias.  Skin: Negative for rash.  Neurological: Positive for headaches. Negative for dizziness, weakness and light-headedness.  Psychiatric/Behavioral: Negative for confusion and dysphoric mood.        Objective:   Physical Exam  Constitutional: She appears well-developed and well-nourished. No distress.  overwt and fatigued appearing (not toxic) with cough  HENT:  Head: Normocephalic and atraumatic.  Right Ear: External ear normal.  Left Ear: External ear normal.  Mouth/Throat: Oropharynx is clear and  moist.  Nares are injected and congested  No sinus tenderness Clear rhinorrhea and post nasal drip   TMs clear   Eyes: Pupils are equal, round, and reactive to light. Conjunctivae and EOM are normal. Right eye exhibits no discharge. Left eye exhibits no discharge. No scleral icterus.  Neck: Normal range of motion. Neck supple.  Cardiovascular: Regular rhythm and normal heart sounds.  No murmur heard. Tachycardia   Pulmonary/Chest: Effort normal and breath sounds normal. No stridor. No respiratory distress. She has no wheezes. She has no rales. She exhibits no tenderness.  Good air exch Barky cough  No rales Few scattered rhonchi No wheeze even on forced exp  Abdominal: Soft.  Musculoskeletal: She exhibits no edema.  Lymphadenopathy:    She has no cervical adenopathy.  Neurological: She is alert. No cranial nerve deficit. Coordination normal.  Skin: Skin is warm and dry. No rash noted. No pallor.  Psychiatric: She has a normal mood and affect.  Pt seems mildly anxious but denies feeling that way          Assessment & Plan:   Problem List Items Addressed This Visit      Respiratory   Acute bronchitis - Primary    S/p recent uri  Some hemoptysis (also prior smoker) cxr today  Cover with doxycyline  Fluids/rest Hycodan for cough with caution  Expectorant prn otc Update if not starting to improve in a week or if worsening        Relevant Orders   DG Chest 2 View     Other   Tachycardia    Higher than baseline today - but also ill  Plan f/u to re check        Leukocytosis    Will plan to re check this later as she is sick today and that may  falsely elevate it  F/u in approx 1 mo - will re check with path review        Hemoptysis    cxr today -pending result In conjunction with bad cough /illness Prior smoker       Relevant Orders   DG Chest 2 View

## 2018-01-30 ENCOUNTER — Other Ambulatory Visit (INDEPENDENT_AMBULATORY_CARE_PROVIDER_SITE_OTHER): Payer: Medicare Other

## 2018-01-30 ENCOUNTER — Ambulatory Visit: Payer: Self-pay | Admitting: Family Medicine

## 2018-01-30 ENCOUNTER — Ambulatory Visit
Admission: RE | Admit: 2018-01-30 | Discharge: 2018-01-30 | Disposition: A | Discharge status: Home / Self Care | Payer: Medicare Other | Source: Ambulatory Visit | Attending: Family Medicine | Admitting: Family Medicine

## 2018-01-30 ENCOUNTER — Telehealth: Payer: Self-pay | Admitting: Family Medicine

## 2018-01-30 DIAGNOSIS — I2510 Atherosclerotic heart disease of native coronary artery without angina pectoris: Secondary | ICD-10-CM | POA: Insufficient documentation

## 2018-01-30 DIAGNOSIS — R9389 Abnormal findings on diagnostic imaging of other specified body structures: Secondary | ICD-10-CM | POA: Diagnosis not present

## 2018-01-30 DIAGNOSIS — R599 Enlarged lymph nodes, unspecified: Secondary | ICD-10-CM | POA: Insufficient documentation

## 2018-01-30 DIAGNOSIS — N200 Calculus of kidney: Secondary | ICD-10-CM | POA: Diagnosis not present

## 2018-01-30 DIAGNOSIS — I700 Atherosclerosis of aorta: Secondary | ICD-10-CM | POA: Insufficient documentation

## 2018-01-30 DIAGNOSIS — D72829 Elevated white blood cell count, unspecified: Secondary | ICD-10-CM | POA: Diagnosis not present

## 2018-01-30 DIAGNOSIS — R918 Other nonspecific abnormal finding of lung field: Secondary | ICD-10-CM | POA: Diagnosis not present

## 2018-01-30 DIAGNOSIS — J439 Emphysema, unspecified: Secondary | ICD-10-CM | POA: Diagnosis not present

## 2018-01-30 LAB — CBC WITH DIFFERENTIAL/PLATELET
BASOS ABS: 0.1 10*3/uL (ref 0.0–0.1)
Basophils Relative: 0.5 % (ref 0.0–3.0)
EOS PCT: 0.8 % (ref 0.0–5.0)
Eosinophils Absolute: 0.2 10*3/uL (ref 0.0–0.7)
HCT: 38.2 % (ref 36.0–46.0)
HEMOGLOBIN: 12.6 g/dL (ref 12.0–15.0)
LYMPHS PCT: 14 % (ref 12.0–46.0)
Lymphs Abs: 2.8 10*3/uL (ref 0.7–4.0)
MCHC: 33.1 g/dL (ref 30.0–36.0)
MCV: 87.2 fl (ref 78.0–100.0)
Monocytes Absolute: 4.3 10*3/uL — ABNORMAL HIGH (ref 0.1–1.0)
Monocytes Relative: 21.9 % — ABNORMAL HIGH (ref 3.0–12.0)
NEUTROS PCT: 62.8 % (ref 43.0–77.0)
Neutro Abs: 12.4 10*3/uL — ABNORMAL HIGH (ref 1.4–7.7)
Platelets: 303 10*3/uL (ref 150.0–400.0)
RBC: 4.38 Mil/uL (ref 3.87–5.11)
RDW: 14.6 % (ref 11.5–15.5)

## 2018-01-30 NOTE — Telephone Encounter (Signed)
Aware She did have suspected lung infection at visit  Pending path review also

## 2018-01-30 NOTE — Telephone Encounter (Signed)
Spoke with patient and she will have CT Chest today at 2:30pm at [facility], .

## 2018-01-30 NOTE — Telephone Encounter (Signed)
[facility] called with critical results. WBC 19.8

## 2018-01-31 LAB — PATHOLOGIST SMEAR REVIEW

## 2018-02-01 ENCOUNTER — Ambulatory Visit (INDEPENDENT_AMBULATORY_CARE_PROVIDER_SITE_OTHER): Payer: Medicare Other | Admitting: Pulmonary Disease

## 2018-02-01 ENCOUNTER — Telehealth: Payer: Self-pay | Admitting: Family Medicine

## 2018-02-01 ENCOUNTER — Encounter: Payer: Self-pay | Admitting: Pulmonary Disease

## 2018-02-01 ENCOUNTER — Telehealth: Payer: Self-pay

## 2018-02-01 VITALS — BP 124/82 | HR 134 | Resp 16 | Ht 63.5 in | Wt 194.0 lb

## 2018-02-01 DIAGNOSIS — F17290 Nicotine dependence, other tobacco product, uncomplicated: Secondary | ICD-10-CM

## 2018-02-01 DIAGNOSIS — R918 Other nonspecific abnormal finding of lung field: Secondary | ICD-10-CM

## 2018-02-01 DIAGNOSIS — R05 Cough: Secondary | ICD-10-CM

## 2018-02-01 DIAGNOSIS — R000 Tachycardia, unspecified: Secondary | ICD-10-CM

## 2018-02-01 DIAGNOSIS — J181 Lobar pneumonia, unspecified organism: Secondary | ICD-10-CM | POA: Diagnosis not present

## 2018-02-01 DIAGNOSIS — R059 Cough, unspecified: Secondary | ICD-10-CM

## 2018-02-01 MED ORDER — AMOXICILLIN-POT CLAVULANATE 875-125 MG PO TABS: 1.0000 | ORAL_TABLET | Freq: Two times a day (BID) | ORAL | 0 refills | Status: AC

## 2018-02-01 MED ORDER — METOPROLOL SUCCINATE ER 50 MG PO TB24: 50.0000 mg | ORAL_TABLET | Freq: Every day | ORAL | 3 refills | Status: DC

## 2018-02-01 NOTE — Telephone Encounter (Signed)
Called the patient and scheduled her the Pulmonary Appointment for today 02/01/18 at 10:45am with Dr Derrill Kay. Patient said she doesn't need Dr Glori Bickers to call her back now that she will see Dr Patsey Berthold this morning.

## 2018-02-01 NOTE — Telephone Encounter (Signed)
Patient called back and states that when she received the call earlier today she was half asleep and is now upset with the information and would like to speak to you again please as soon as possible. Thank you

## 2018-02-01 NOTE — Telephone Encounter (Signed)
Pt notified of Dr. Marliss Coots comments and instructions and verbalized understanding. Pt will get new med and f/u appt scheduled

## 2018-02-01 NOTE — Telephone Encounter (Signed)
Please let pt know that her pulmonary doctor wants [place] to work on getting her heart rate down (it is usually high)   I sent in metoprolol xl 50 mg  1 pill daily  If any side effects or problems let me know (or if she feels dizzy)  She can cut lisinopril hct in 1/2 and take half pill daily since bp will be lowered more by metoprolol   F/u with me in 1-2 weeks to check bp and pulse

## 2018-02-01 NOTE — Patient Instructions (Signed)
1) we will switch her antibiotic to Augmentin 875 mg 1 tablet twice a day.  Make sure you take it with food.  2) stop doxycycline.  3) we will see you in 3 weeks with a chest CT at that time.  4) Dr. Glori Bickers will call you for an appointment to change your medication to control your heart rate.  5) call prior to your follow-up appointment if any new symptoms develop.

## 2018-02-01 NOTE — Telephone Encounter (Signed)
No PA required with [facility] [facility] for CT Chest Super D. Rhonda J Cobb

## 2018-02-01 NOTE — Telephone Encounter (Signed)
Super D CT  Scheduled 02/20/18 @ 1300 [employer] and [employer] secondary.

## 2018-02-01 NOTE — Telephone Encounter (Signed)
I spoke to pt on the phone re: her CT- mass (? Cancer vs infection in L apical area)   She is feeling better so far on the antibiotics Also rev labs with elevated wbc /left shift- not unexpected   Told her to continue abx  Will do urgent ref to Pulmonary for the mass  She will expect a call Routing to [facility]

## 2018-02-01 NOTE — Progress Notes (Signed)
Subjective:    Patient ID: Anita Harrell, female    DOB: 1951-02-28, 67 y.o.   MRN: [id number]  HPI Patient is a 67 year old former cigarette smoker (still active with e-cigarettes) who presents for evaluation and management of a cough and abnormal chest CT.  She is kindly referred by Dr. Loura Pardon.  The patient noted a cough associated with generalized malaise after she had an upper respiratory infection in October.  She developed symptoms with cough productive of yellowish to greenish sputum and occasionally with streaky hemoptysis (now resolved) approximately a week to a week and a half after her viral respiratory illness.  The cough sounded "barky".  She felt significant generalized malaise.  She felt "rundown".  She had an episode of fever to 101 associated with chills no diaphoresis.  She been treated with antibiotics for the last 5 days namely doxycycline.  This is actually made her symptoms somewhat better.  She had associated leukocytosis with the findings (as high as 19.8).  Patient underwent chest x-ray on 18 November that showed a density on the left upper lobe.  Was followed by a chest CT on 21 November.  Patient has a dense infiltrate/mass on the left upper lobe however there are some areas of groundglass attenuation around this and some bronchograms noted.  The patient is of course very anxious and concerned about these findings.  Additionally the patient appears to have some issues with esophageal motility as evidenced by esophageal air-fluid level on the film.  These films have been independently reviewed and have been reviewed with the patient.  She has not noted dyspnea over her baseline which is usually with heavy exertion only.  The patient has had no weight loss.  No anorexia.  Otherwise no new issues.  Past medical history, surgical history and family history of been reviewed.  They are as noted.  The patient smoked cigarettes from 1970-2000 approximately 1 to 2 packs of  cigarettes per day.  She quit in 2000 but has since been using an e-cigarette (SMOK) with a nicotine solution.  She does not use any other solutions but this.  No flavors.  She has no occupational exposures.  She has 4 dogs and no exotic pets.  Has never been in the [employer].  She has lived in [place].  No travel outside of the country.  No history of tuberculosis exposure.  Review of Systems  Constitutional: Positive for chills, fatigue and fever.  HENT: Negative.   Eyes: Negative.   Respiratory: Positive for cough and shortness of breath.   Cardiovascular: Negative.   Gastrointestinal: Negative.   Endocrine: Negative.   Genitourinary: Negative.   Musculoskeletal: Negative.   Skin: Negative.   Allergic/Immunologic: Negative.   Neurological: Negative.   Hematological: Negative.   Psychiatric/Behavioral: The patient is nervous/anxious.   All other systems reviewed and are negative.      Objective:   Physical Exam  Constitutional: She is oriented to person, place, and time. She appears well-developed and well-nourished.  HENT:  Head: Normocephalic and atraumatic.  Mouth/Throat: Oropharynx is clear and moist.  Eyes: Pupils are equal, round, and reactive to light. EOM are normal.  Neck: Normal range of motion. Neck supple.  Cardiovascular: Regular rhythm, S1 normal, S2 normal, intact distal pulses and normal pulses.  No extrasystoles are present. Tachycardia present. Exam reveals no gallop.  Pulmonary/Chest: Effort normal. She has decreased breath sounds in the left upper field. She has no wheezes. She has rhonchi in the  left upper field and the left middle field. She has no rales.  Abdominal: Soft. Bowel sounds are normal. She exhibits no distension.  Musculoskeletal: Normal range of motion.       Right lower leg: Normal. She exhibits no edema.       Left lower leg: Normal. She exhibits no edema.  Neurological: She is alert and oriented to person, place, and time.  No  focal deficit noted.  Skin: Skin is warm and dry. No cyanosis. Nails show no clubbing.  Psychiatric: Her mood appears anxious.  Nursing note and vitals reviewed.   I have reviewed the imaging available independently.  This is also shown to the patient.      Assessment & Plan:   1) Lung mass/infiltrate left upper lobe: The patient had clear evidence of infectious symptoms with leukocytosis fever and chills.  Though the CT findings are worrisome this could represent a dense necrotizing type pneumonia.  The patient has other issues to include significant tachycardia that need to be controlled before bronchoscopy can be considered.  If bronchoscopy is necessary she will need a super dimension CT to be able to plan the bronchoscopy trajectory.  We will see the patient in 3 weeks time with a super dimension CT at that time.  2) Pneumonia, no organism specified: Patient had clear infectious symptoms.  The findings of the CT could represent a dense necrotizing pneumonia.  She will be treated with Augmentin 875 mg twice a day.  This will cover for anaerobes.  3) Tachycardia: Patient has notable tachycardia.  I discussed this issue with Dr. Glori Bickers.  She apparently runs of baseline tachycardia she will make some adjustments on the patient's medications.  May require beta-blocker or calcium channel blocker for control.  4) Nicotine dependence in the form of e-cigarette: The patient was counseled with regards to discontinuation of all nicotine-containing products.    Thank you for allowing me to participate in this patient's care.  As noted above we will see her in 3 weeks time with a repeat CT scan of the chest with super dimension protocol.

## 2018-02-01 NOTE — Telephone Encounter (Signed)
Thanks for getting the appt so fast -aware

## 2018-02-12 ENCOUNTER — Ambulatory Visit (INDEPENDENT_AMBULATORY_CARE_PROVIDER_SITE_OTHER): Payer: Medicare Other | Admitting: Family Medicine

## 2018-02-12 ENCOUNTER — Encounter: Payer: Self-pay | Admitting: Family Medicine

## 2018-02-12 VITALS — BP 128/72 | HR 77 | Temp 98.0°F | Ht 63.5 in | Wt 199.8 lb

## 2018-02-12 DIAGNOSIS — R918 Other nonspecific abnormal finding of lung field: Secondary | ICD-10-CM

## 2018-02-12 DIAGNOSIS — I10 Essential (primary) hypertension: Secondary | ICD-10-CM | POA: Diagnosis not present

## 2018-02-12 DIAGNOSIS — F419 Anxiety disorder, unspecified: Secondary | ICD-10-CM | POA: Diagnosis not present

## 2018-02-12 DIAGNOSIS — R059 Cough, unspecified: Secondary | ICD-10-CM

## 2018-02-12 DIAGNOSIS — R000 Tachycardia, unspecified: Secondary | ICD-10-CM

## 2018-02-12 DIAGNOSIS — R05 Cough: Secondary | ICD-10-CM | POA: Diagnosis not present

## 2018-02-12 MED ORDER — HYDROCOD POLST-CPM POLST ER 10-8 MG/5ML PO SUER: 5.0000 mL | Freq: Every evening | ORAL | 0 refills | Status: DC | PRN

## 2018-02-12 MED ORDER — ALPRAZOLAM 0.5 MG PO TABS: 0.5000 mg | ORAL_TABLET | Freq: Every day | ORAL | 0 refills | Status: DC | PRN

## 2018-02-12 MED ORDER — LISINOPRIL-HYDROCHLOROTHIAZIDE 10-12.5 MG PO TABS: 0.5000 | ORAL_TABLET | Freq: Every day | ORAL | 0 refills | Status: DC

## 2018-02-12 NOTE — Assessment & Plan Note (Signed)
Assoc with recent illness /pneumonia (with lung mass on CT) Overall much improved with augmentin No longer hemoptysis Px tussionex for pm cough (works better for her)- with caution of sedation and habit  F/u with pulmonary as planned

## 2018-02-12 NOTE — Progress Notes (Signed)
Subjective:    Patient ID: Anita Harrell, female    DOB: 1950/07/08, 67 y.o.   MRN: [id number]  HPI Here for f/u of BP and pulse   Wt Readings from Last 3 Encounters:  02/12/18 199 lb 12 oz (90.6 kg)  02/01/18 194 lb (88 kg)  01/29/18 194 lb 4 oz (88.1 kg)   34.83 kg/m   Recent diagnosis with lung mass vs infection (pneumonia)  Has hx of chronic tachycardia that became worse - we started metoprolol xl 50 mg   Super dimension will be planned next / then bronchoscopy if needed (3 wk) Pulmonary added augmentin to cover anaerobes   Does feel much better  No longer coughing up blood  Cough is still persistent  A little wheezing (uses inhaler only if needed)   Nicotine -very little in vaping   Quite anxious    Pulse Readings from Last 3 Encounters:  02/12/18 77  02/01/18 (!) 134  01/29/18 (!) 126   Much improved  bp is stable today  No cp or palpitations or headaches or edema  No side effects to medicines  BP Readings from Last 3 Encounters:  02/12/18 128/72  02/01/18 124/82  01/29/18 124/66    Also takes lisinopril hct 10-12.5 (cut it to 1/2 given she takes the beta blocker)   Still struggling with cough Would like a refill of hycodan    Lab Results  Component Value Date   WBC 19.8 Repeated and verified X2. ([place]) 01/30/2018   HGB 12.6 01/30/2018   HCT 38.2 01/30/2018   MCV 87.2 01/30/2018   PLT 303.0 01/30/2018   Path rev of cbc is reassuring- indicating likely cause as infection/inflammation  Patient Active Problem List   Diagnosis Date Noted  . Lung mass 02/01/2018  . Hemoptysis 01/29/2018  . Abnormal chest x-ray 01/29/2018  . Cough 01/01/2018  . Tachycardia 11/01/2015  . Microscopic hematuria 08/13/2012  . Routine general medical examination at a health care facility 07/22/2012  . Leukocytosis 03/30/2010  . Hyperglycemia 02/21/2008  . HYPERTENSION, BENIGN ESSENTIAL 01/22/2008  . ALLERGIC RHINITIS 07/25/2007  . BACK PAIN 06/26/2007  .  Personal history of goiter 07/27/2006  . Hyperlipidemia 07/27/2006  . Anxiety disorder 07/27/2006  . PANIC DISORDER 07/27/2006  . Former smoker 07/27/2006  . Depression with anxiety 07/27/2006  . INSOMNIA, HX OF 07/27/2006  . Personal history of colonic polyps 06/09/2004   Past Medical History:  Diagnosis Date  . Allergy    allergic rhinitis  . Anxiety   . Back pain   . Chest pain   . Depression   . Epigastric pain   . Goiter   . History of tobacco abuse   . Hyperglycemia    mild  . Hyperlipidemia   . Hypertension   . Insomnia    Hx of  . Labile blood pressure   . Panic disorder    History of  . Personal history of colonic polyps 06/09/2004  . SVD (spontaneous vaginal delivery)    x 2   Past Surgical History:  Procedure Laterality Date  . ABDOMINAL HYSTERECTOMY N/A 09/24/2012   Procedure: HYSTERECTOMY ABDOMINAL;  Surgeon: Gus Height, MD;  Location: [facility];  Service: Gynecology;  Laterality: N/A;  . BREAST BIOPSY    . COLONOSCOPY    . COLONOSCOPY  2017  . ELBOW SURGERY  2004  . EYE SURGERY     bilateral lasik  . SALPINGOOPHORECTOMY Bilateral 09/24/2012   Procedure: SALPINGO OOPHORECTOMY;  Surgeon: Gus Height,  MD;  Location: [facility];  Service: Gynecology;  Laterality: Bilateral;  . THYROID SURGERY     B9 massess  . WART FULGURATION N/A 09/24/2012   Procedure: FULGURATION VAGINAL WART;  Surgeon: Gus Height, MD;  Location: [facility];  Service: Gynecology;  Laterality: N/A;  . WISDOM TOOTH EXTRACTION     Social History   Tobacco Use  . Smoking status: Former Smoker    Packs/day: 1.00    Years: 20.00    Pack years: 20.00    Types: Cigarettes    Last attempt to quit: 12/11/2008    Years since quitting: 9.1  . Smokeless tobacco: Never Used  Substance Use Topics  . Alcohol use: No    Alcohol/week: 0.0 standard drinks  . Drug use: No   Family History  Problem Relation Age of Onset  . Hypertension Mother   . Stroke Mother   . Alcohol abuse Father   . Cancer Father         lung CA  . Heart disease Father        CHF  . Cancer Sister        breast CA  . Heart disease Sister        CHF from chemotx also has defib  . Breast cancer Sister   . Colon cancer Neg Hx   . Esophageal cancer Neg Hx   . Rectal cancer Neg Hx   . Stomach cancer Neg Hx    Allergies  Allergen Reactions  . Codeine Nausea And Vomiting    REACTION: Nausea and vomiting Pt has tolerated vicodin & percocet in the past  . Lipitor [Atorvastatin]     Muscle pain   . Wellbutrin [Bupropion]    Current Outpatient Medications on File Prior to Visit  Medication Sig Dispense Refill  . albuterol (PROAIR HFA) 108 (90 Base) MCG/ACT inhaler Inhale 2 puffs into the lungs every 6 (six) hours as needed (cough). 18 g 1  . amoxicillin-clavulanate (AUGMENTIN) 875-125 MG tablet Take 1 tablet by mouth 2 (two) times daily for 14 days. 28 tablet 0  . aspirin 81 MG tablet Take 81 mg by mouth daily.    . busPIRone (BUSPAR) 15 MG tablet Take 1 tablet (15 mg total) by mouth 3 (three) times daily. 270 tablet 3  . metoprolol succinate (TOPROL-XL) 50 MG 24 hr tablet Take 1 tablet (50 mg total) by mouth daily. Take with or immediately following a meal. 30 tablet 3  . Omega-3 Fatty Acids (FISH OIL PO) Take 1 capsule by mouth daily.    Marland Kitchen PARoxetine (PAXIL) 40 MG tablet TAKE 1.5 TABLETS BY MOUTH ONCE DAILY 135 tablet 3  . rosuvastatin (CRESTOR) 10 MG tablet TAKE 1 TABLET BY MOUTH ONCE DAILY 90 tablet 3  . solifenacin (VESICARE) 10 MG tablet Take 1 tablet (10 mg total) by mouth daily. 30 tablet 11  . zolpidem (AMBIEN) 10 MG tablet TAKE 1 TABLET BY MOUTH AT BEDTIME AS NEEDED 30 tablet 3   No current facility-administered medications on file prior to visit.      Review of Systems  Constitutional: Negative for activity change, appetite change, fatigue, fever and unexpected weight change.  HENT: Positive for congestion and postnasal drip. Negative for ear pain, rhinorrhea, sinus pressure and sore throat.   Eyes:  Negative for pain, redness and visual disturbance.  Respiratory: Positive for cough. Negative for choking, chest tightness, shortness of breath, wheezing and stridor.   Cardiovascular: Negative for chest pain and palpitations.  Gastrointestinal: Negative  for abdominal pain, blood in stool, constipation and diarrhea.  Endocrine: Negative for polydipsia and polyuria.  Genitourinary: Negative for dysuria, frequency and urgency.  Musculoskeletal: Negative for arthralgias, back pain and myalgias.  Skin: Negative for pallor and rash.  Allergic/Immunologic: Negative for environmental allergies.  Neurological: Negative for dizziness, syncope and headaches.  Hematological: Negative for adenopathy. Does not bruise/bleed easily.  Psychiatric/Behavioral: Negative for decreased concentration and dysphoric mood. The patient is nervous/anxious.        Objective:   Physical Exam  Constitutional: She appears well-developed and well-nourished. No distress.  obese and well appearing   HENT:  Head: Normocephalic and atraumatic.  Mouth/Throat: Oropharynx is clear and moist. No oropharyngeal exudate.  Nares are boggy  Eyes: Pupils are equal, round, and reactive to light. Conjunctivae and EOM are normal. Right eye exhibits no discharge. Left eye exhibits no discharge. No scleral icterus.  Neck: Normal range of motion. Neck supple. No JVD present. Carotid bruit is not present. No tracheal deviation present. No thyromegaly present.  Cardiovascular: Normal rate, regular rhythm, normal heart sounds and intact distal pulses. Exam reveals no gallop.  No murmur heard. Pulmonary/Chest: Effort normal and breath sounds normal. No stridor. No respiratory distress. She has no wheezes. She has no rales. She exhibits no tenderness.  No crackles  Harsh bs No rales or rhonchi No wheeze even on forced expiration  Good air exch  Abdominal: She exhibits no abdominal bruit.  Musculoskeletal: She exhibits no edema or  deformity.  Lymphadenopathy:    She has no cervical adenopathy.  Neurological: She is alert. She has normal reflexes. No cranial nerve deficit. Coordination normal.  Skin: Skin is warm and dry. No rash noted.  Psychiatric: Her speech is normal and behavior is normal. Thought content normal. Her mood appears anxious. Her affect is not blunt. Cognition and memory are normal. She does not exhibit a depressed mood.  Anxious-but calms as visit progresses Voices worries re: lung mass/diagnosis and the unknown          Assessment & Plan:   Problem List Items Addressed This Visit      Cardiovascular and Mediastinum   HYPERTENSION, BENIGN ESSENTIAL    Lisinopril hct cut in 1/2 to add beta blocker BP: 128/72  Good control and pulse is much improved  Will continue with current tx  Enc healthy diet and also vaping cessation asap      Relevant Medications   lisinopril-hydrochlorothiazide (PRINZIDE,ZESTORETIC) 10-12.5 MG tablet     Other   Tachycardia    Much improved with addn of metoprolol Pulse Readings from Last 3 Encounters:  02/12/18 77  02/01/18 (!) 134  01/29/18 (!) 126   Good bp No changes in tx      Lung mass    ? Neoplasm vs pneumonia Clinical imp with abx tx -augmentin  Planning more imaging (then bronchoscopy if needed)  Reassuring exam today      Depression with anxiety   Relevant Medications   ALPRAZolam (XANAX) 0.5 MG tablet   Cough - Primary    Assoc with recent illness /pneumonia (with lung mass on CT) Overall much improved with augmentin No longer hemoptysis Px tussionex for pm cough (works better for her)- with caution of sedation and habit  F/u with pulmonary as planned      Anxiety disorder    Much more anxious since diag with lung mass Some improved today (also feeling better physically) Px for xanax 0.5 mg to take daily prn severe anxiety given  Warned of sedation and habit  She will use for emergencies Continue paxil and buspar       Relevant Medications   ALPRAZolam (XANAX) 0.5 MG tablet

## 2018-02-12 NOTE — Assessment & Plan Note (Signed)
Much improved with addn of metoprolol Pulse Readings from Last 3 Encounters:  02/12/18 77  02/01/18 (!) 134  01/29/18 (!) 126   Good bp No changes in tx

## 2018-02-12 NOTE — Assessment & Plan Note (Signed)
Lisinopril hct cut in 1/2 to add beta blocker BP: 128/72  Good control and pulse is much improved  Will continue with current tx  Enc healthy diet and also vaping cessation asap

## 2018-02-12 NOTE — Assessment & Plan Note (Signed)
?   Neoplasm vs pneumonia Clinical imp with abx tx -augmentin  Planning more imaging (then bronchoscopy if needed)  Reassuring exam today

## 2018-02-12 NOTE — Assessment & Plan Note (Signed)
Much more anxious since diag with lung mass Some improved today (also feeling better physically) Px for xanax 0.5 mg to take daily prn severe anxiety given Warned of sedation and habit  She will use for emergencies Continue paxil and buspar

## 2018-02-12 NOTE — Patient Instructions (Addendum)
Stop the vaping immediately   Continue drinking fluids and rest  Use inhaler if you need it   Try tussionex for night time cough- use caution of sedation (also falls and habit)  This can also cause constipation - use a stool softener if needed   Xanax is for emergencies (anxiety)   Take care of yourself   Follow up with pulmonary as planned

## 2018-02-20 ENCOUNTER — Ambulatory Visit
Admission: RE | Admit: 2018-02-20 | Discharge: 2018-02-20 | Disposition: A | Discharge status: Home / Self Care | Payer: Medicare Other | Source: Ambulatory Visit | Attending: Pulmonary Disease | Admitting: Pulmonary Disease

## 2018-02-20 DIAGNOSIS — R918 Other nonspecific abnormal finding of lung field: Secondary | ICD-10-CM | POA: Diagnosis not present

## 2018-02-20 DIAGNOSIS — J439 Emphysema, unspecified: Secondary | ICD-10-CM | POA: Diagnosis not present

## 2018-02-27 ENCOUNTER — Encounter: Payer: Self-pay | Admitting: Pulmonary Disease

## 2018-02-27 ENCOUNTER — Ambulatory Visit (INDEPENDENT_AMBULATORY_CARE_PROVIDER_SITE_OTHER): Payer: Medicare Other | Admitting: Pulmonary Disease

## 2018-02-27 VITALS — BP 124/84 | HR 87 | Ht 63.5 in | Wt 202.4 lb

## 2018-02-27 DIAGNOSIS — R0600 Dyspnea, unspecified: Secondary | ICD-10-CM

## 2018-02-27 DIAGNOSIS — R000 Tachycardia, unspecified: Secondary | ICD-10-CM

## 2018-02-27 DIAGNOSIS — F17290 Nicotine dependence, other tobacco product, uncomplicated: Secondary | ICD-10-CM

## 2018-02-27 DIAGNOSIS — J181 Lobar pneumonia, unspecified organism: Secondary | ICD-10-CM

## 2018-02-27 DIAGNOSIS — R918 Other nonspecific abnormal finding of lung field: Secondary | ICD-10-CM | POA: Diagnosis not present

## 2018-02-27 NOTE — Patient Instructions (Addendum)
1.  You will be scheduled for repeat CT scan of the chest in 2 months time.  We will see you in follow-up at that time.  Please contact us prior to that time should he have any new difficulties with regards to your breathing.  2.  Continue to decrease use of e-cigarette as you are now doing.  3.  We will schedule you for breathing test to assess your lung function.

## 2018-02-27 NOTE — Progress Notes (Signed)
Subjective:    Patient ID: Anita Harrell, female    DOB: 06/25/1950, 67 y.o.   MRN: [id number]  HPI Patient is a 67 year old former cigarette smoker (still active with e-cigarettes) who presents for follow-up of a cough and abnormal chest CT. Her primary physician is Dr. Loura Pardon.    We first evaluated her on 22 November.  At that time she appeared acutely ill and was being treated with doxycycline that had  made her symptoms somewhat better.  She had associated leukocytosis with the findings (as high as 19.8).  Patient underwent chest x-ray on 18 November that showed a density on the left upper lobe.  This wasfollowed by a chest CT on 21 November.  Patient has a dense infiltrate/mass on the left upper lobe however there are some areas of groundglass attenuation around this and some bronchograms noted.    Proceeded to treat her with Augmentin with the working impression that this was a developing necrotizing pneumonia.  Since then she has done well.  Fever, chills and sweats have resolved.  Cough has resolved.  She still has some baseline dyspnea on exertion but overall does not have the dyspnea at rest that she was experiencing.  She was also noted to be significantly tachycardic during her last visit this has also resolved.  She is currently on Toprol-XL.  She had a repeat CT scan the chest on 11 December.  This shows that the area in question is greatly reduced in size.  There continues to be air bronchograms coursing through this mass.  She will need ongoing follow-up until resolution of this finding.  The radiologist interpretation mention possibility of TB however the patient is improved with regular antibiotics and the suspicion for TB is low.  She does not have any of the other symptoms associated with it.  Review of Systems  Constitutional: Negative.   HENT: Negative.   Eyes: Negative.   Respiratory: Negative.   Cardiovascular: Negative.   Gastrointestinal: Negative.   Skin: Negative.    Neurological: Negative.   Psychiatric/Behavioral: Negative.   All other systems reviewed and are negative.      Objective:   Physical Exam Vitals signs and nursing note reviewed.  Constitutional:      General: She is not in acute distress.    Appearance: Normal appearance. She is obese.  HENT:     Head: Normocephalic and atraumatic.     Right Ear: External ear normal.     Left Ear: External ear normal.     Nose: Nose normal.     Mouth/Throat:     Mouth: Mucous membranes are moist.  Eyes:     General: No scleral icterus.    Extraocular Movements: Extraocular movements intact.     Conjunctiva/sclera: Conjunctivae normal.     Pupils: Pupils are equal, round, and reactive to light.  Neck:     Musculoskeletal: Normal range of motion and neck supple.  Cardiovascular:     Rate and Rhythm: Normal rate and regular rhythm.     Pulses: Normal pulses.     Heart sounds: Normal heart sounds.  Pulmonary:     Effort: Pulmonary effort is normal.     Breath sounds: Normal breath sounds.  Abdominal:     General: Abdomen is flat.     Palpations: Abdomen is soft.  Musculoskeletal: Normal range of motion.     Right lower leg: No edema.     Left lower leg: No edema.  Lymphadenopathy:  Cervical: No cervical adenopathy.  Skin:    General: Skin is warm and dry.  Neurological:     General: No focal deficit present.     Mental Status: She is alert and oriented to person, place, and time.  Psychiatric:        Mood and Affect: Mood normal.        Behavior: Behavior normal.           Assessment & Plan:   1) Lung mass/infiltrate left upper lobe:  Suspect that this represented a dense necrotizing type pneumonia.    Repeat CT scan of the chest on 11 December shows marked improvement of this process.  We will repeat CT scan in 2 months time and follow-up process to full resolution.  We will see the patient in follow-up in 2 months time she is to contact us prior to that time should any  new difficulties arise.   2) Pneumonia, no organism specified: Symptoms of pneumonia have improved with Augmentin.  No further need for antibiotics.  3) Tachycardia: Has resolved with the use of beta-blocker.  4)  Dyspnea on exertion: Will obtain PFTs to evaluate this issue.  She does have evidence of mild emphysema on CT scan.  5) Nicotine dependence in the form of e-cigarette: The patient was commended on the progress of weaning herself off of the use of e-cigarettes.

## 2018-03-01 ENCOUNTER — Ambulatory Visit: Payer: Medicare Other | Admitting: Family Medicine

## 2018-03-01 DIAGNOSIS — Z0289 Encounter for other administrative examinations: Secondary | ICD-10-CM

## 2018-03-27 ENCOUNTER — Ambulatory Visit: Payer: Self-pay | Admitting: Family Medicine

## 2018-04-03 ENCOUNTER — Ambulatory Visit (INDEPENDENT_AMBULATORY_CARE_PROVIDER_SITE_OTHER): Payer: Medicare Other | Admitting: Family Medicine

## 2018-04-03 ENCOUNTER — Encounter: Payer: Self-pay | Admitting: Family Medicine

## 2018-04-03 VITALS — BP 116/72 | HR 80 | Temp 97.6°F | Ht 63.5 in | Wt 203.5 lb

## 2018-04-03 DIAGNOSIS — R000 Tachycardia, unspecified: Secondary | ICD-10-CM

## 2018-04-03 DIAGNOSIS — I10 Essential (primary) hypertension: Secondary | ICD-10-CM | POA: Diagnosis not present

## 2018-04-03 DIAGNOSIS — R918 Other nonspecific abnormal finding of lung field: Secondary | ICD-10-CM

## 2018-04-03 DIAGNOSIS — F418 Other specified anxiety disorders: Secondary | ICD-10-CM | POA: Diagnosis not present

## 2018-04-03 DIAGNOSIS — Z789 Other specified health status: Secondary | ICD-10-CM | POA: Diagnosis not present

## 2018-04-03 DIAGNOSIS — E669 Obesity, unspecified: Secondary | ICD-10-CM | POA: Diagnosis not present

## 2018-04-03 DIAGNOSIS — F419 Anxiety disorder, unspecified: Secondary | ICD-10-CM

## 2018-04-03 MED ORDER — METOPROLOL SUCCINATE ER 50 MG PO TB24: 50.0000 mg | ORAL_TABLET | Freq: Every day | ORAL | 3 refills | Status: DC

## 2018-04-03 MED ORDER — LISINOPRIL-HYDROCHLOROTHIAZIDE 10-12.5 MG PO TABS: 0.5000 | ORAL_TABLET | Freq: Every day | ORAL | 3 refills | Status: DC

## 2018-04-03 NOTE — Patient Instructions (Signed)
I'm glad you are doing better  Continue current medicines without change   Keep working on quitting e cigs   Continue follow up with pulmonary

## 2018-04-03 NOTE — Assessment & Plan Note (Signed)
Now thought likely from pneumonia Much clinical imp  Follows up with pulmonary - CT in feb

## 2018-04-03 NOTE — Assessment & Plan Note (Signed)
Plans to work on wt loss now that she is feeling better  Disc stopping soda/coke and starting exercise (walking)

## 2018-04-03 NOTE — Assessment & Plan Note (Signed)
bp in fair control at this time  BP Readings from Last 1 Encounters:  04/03/18 116/72   No changes needed Most recent labs reviewed  Disc lifstyle change with low sodium diet and exercise

## 2018-04-03 NOTE — Assessment & Plan Note (Signed)
Pt is working on cessation of this  Enc her to keep working on it

## 2018-04-03 NOTE — Assessment & Plan Note (Signed)
Much improved with addn of buspar (and reduction of stressors)  No longer needing xanax  Reviewed stressors/ coping techniques/symptoms/ support sources/ tx options and side effects in detail today Stressed imp of self care

## 2018-04-03 NOTE — Assessment & Plan Note (Signed)
Much improved with metoprolol xl  Will continue this  Enc fitness as well

## 2018-04-03 NOTE — Assessment & Plan Note (Signed)
Anxiety is improved No longer needing xanax buspar is helping  Continues paxil  Still some down days - but easier to recover (grief)  Reviewed stressors/ coping techniques/symptoms/ support sources/ tx options and side effects in detail today Stressed imp of self care

## 2018-04-03 NOTE — Progress Notes (Signed)
Subjective:    Patient ID: Anita Harrell, female    DOB: 03-17-1950, 68 y.o.   MRN: [id number]  HPI Here for f/u of tachycardia and HTN and chronic problems   Wt Readings from Last 3 Encounters:  04/03/18 203 lb 8 oz (92.3 kg)  02/27/18 202 lb 6.4 oz (91.8 kg)  02/12/18 199 lb 12 oz (90.6 kg)  did gain some weight  Overall feeling much better  35.48 kg/m   Doing well with her pulmonary f/u  Will have PFT CT scan in feb   Working on getting on e cig    bp is stable today  No cp or palpitations or headaches or edema  No side effects to medicines  BP Readings from Last 3 Encounters:  04/03/18 116/72  02/27/18 124/84  02/12/18 128/72     Tachycardia/chronic  Improved with metoprolol at last visit  Pulse Readings from Last 3 Encounters:  04/03/18 80  02/27/18 87  02/12/18 77   Still in good control   Anxiety -doing better since good news from her pulmonologist  Gets down at times  Not as many stressors  paxil buspar-really helps Xanax prn - not needing    Review of Systems  Constitutional: Negative for activity change, appetite change, fatigue, fever and unexpected weight change.  HENT: Negative for congestion, ear pain, rhinorrhea, sinus pressure and sore throat.   Eyes: Negative for pain, redness and visual disturbance.  Respiratory: Negative for cough, chest tightness, shortness of breath and wheezing.   Cardiovascular: Negative for chest pain and palpitations.  Gastrointestinal: Negative for abdominal pain, blood in stool, constipation and diarrhea.  Endocrine: Negative for polydipsia and polyuria.  Genitourinary: Negative for dysuria, frequency and urgency.  Musculoskeletal: Negative for arthralgias, back pain and myalgias.  Skin: Negative for pallor and rash.  Allergic/Immunologic: Negative for environmental allergies.  Neurological: Negative for dizziness, syncope, light-headedness and headaches.  Hematological: Negative for adenopathy. Does not  bruise/bleed easily.  Psychiatric/Behavioral: Positive for dysphoric mood. Negative for decreased concentration, self-injury and suicidal ideas. The patient is nervous/anxious.        Overall mood is improved        Objective:   Physical Exam Constitutional:      General: She is not in acute distress.    Appearance: Normal appearance. She is well-developed. She is obese. She is not ill-appearing.  HENT:     Head: Normocephalic and atraumatic.     Mouth/Throat:     Mouth: Mucous membranes are moist.     Pharynx: Oropharynx is clear.  Eyes:     General: No scleral icterus.    Conjunctiva/sclera: Conjunctivae normal.     Pupils: Pupils are equal, round, and reactive to light.  Neck:     Musculoskeletal: Normal range of motion and neck supple.     Thyroid: No thyromegaly.     Vascular: No carotid bruit or JVD.  Cardiovascular:     Rate and Rhythm: Normal rate and regular rhythm.     Pulses: Normal pulses.     Heart sounds: Normal heart sounds. No gallop.   Pulmonary:     Effort: Pulmonary effort is normal. No respiratory distress.     Breath sounds: Normal breath sounds. No stridor. No wheezing, rhonchi or rales.     Comments: Good air exch  No cough Abdominal:     General: There is no distension or abdominal bruit.  Musculoskeletal:     Left lower leg: No edema.  Lymphadenopathy:  Cervical: No cervical adenopathy.  Skin:    General: Skin is warm and dry.     Findings: No rash.  Neurological:     General: No focal deficit present.     Mental Status: She is alert.     Motor: No tremor.     Coordination: Coordination normal.     Deep Tendon Reflexes: Reflexes are normal and symmetric. Reflexes normal.  Psychiatric:        Mood and Affect: Mood normal.     Comments: Much less anxious today  Not tearful            Assessment & Plan:   Problem List Items Addressed This Visit      Cardiovascular and Mediastinum   HYPERTENSION, BENIGN ESSENTIAL - Primary    bp  in fair control at this time  BP Readings from Last 1 Encounters:  04/03/18 116/72   No changes needed Most recent labs reviewed  Disc lifstyle change with low sodium diet and exercise        Relevant Medications   lisinopril-hydrochlorothiazide (PRINZIDE,ZESTORETIC) 10-12.5 MG tablet   metoprolol succinate (TOPROL-XL) 50 MG 24 hr tablet     Other   Anxiety disorder    Much improved with addn of buspar (and reduction of stressors)  No longer needing xanax  Reviewed stressors/ coping techniques/symptoms/ support sources/ tx options and side effects in detail today Stressed imp of self care       Depression with anxiety    Anxiety is improved No longer needing xanax buspar is helping  Continues paxil  Still some down days - but easier to recover (grief)  Reviewed stressors/ coping techniques/symptoms/ support sources/ tx options and side effects in detail today Stressed imp of self care      Tachycardia    Much improved with metoprolol xl  Will continue this  Enc fitness as well       Lung mass    Now thought likely from pneumonia Much clinical imp  Follows up with pulmonary - CT in feb      Electronic cigarette use    Pt is working on cessation of this  Enc her to keep working on it

## 2018-04-08 ENCOUNTER — Other Ambulatory Visit: Payer: Self-pay | Admitting: Family Medicine

## 2018-04-08 NOTE — Telephone Encounter (Signed)
Name of Medication: Zolpidem Name of Pharmacy: [facility] or Written Date and Quantity: 12/12/2017 for 30 day supply with 3 additional refills Last Office Visit and Type: 04/03/2018 routine follow up Next Office Visit and Type: no future appointments Last Controlled Substance Agreement Date: 07/13/2015  Last UDS: 07/13/2015

## 2018-04-23 ENCOUNTER — Ambulatory Visit (HOSPITAL_COMMUNITY): Payer: Medicare Other

## 2018-04-23 ENCOUNTER — Ambulatory Visit
Admission: RE | Admit: 2018-04-23 | Discharge: 2018-04-23 | Disposition: A | Discharge status: Home / Self Care | Payer: Medicare Other | Source: Ambulatory Visit | Attending: Pulmonary Disease | Admitting: Pulmonary Disease

## 2018-04-23 ENCOUNTER — Encounter: Payer: Self-pay | Admitting: *Deleted

## 2018-04-23 DIAGNOSIS — J439 Emphysema, unspecified: Secondary | ICD-10-CM | POA: Diagnosis not present

## 2018-04-23 DIAGNOSIS — R918 Other nonspecific abnormal finding of lung field: Secondary | ICD-10-CM

## 2018-04-23 DIAGNOSIS — R0600 Dyspnea, unspecified: Secondary | ICD-10-CM

## 2018-04-23 MED ORDER — ALBUTEROL SULFATE (2.5 MG/3ML) 0.083% IN NEBU
2.5000 mg | INHALATION_SOLUTION | Freq: Once | RESPIRATORY_TRACT | Status: AC
  Administered 2018-04-23: 2.5 mg via RESPIRATORY_TRACT
  Filled 2018-04-23: qty 3

## 2018-04-25 ENCOUNTER — Telehealth: Payer: Self-pay | Admitting: Pulmonary Disease

## 2018-04-25 NOTE — Telephone Encounter (Signed)
Notes recorded by Tyler Pita, MD on 04/23/2018 at 2:09 PM EST CT shows continued improvement on the area we were following (getting smaller). Recommend we follow with a CT at 3 months. Looks better  Pt is aware of results and voiced her understanding.  Dr. Patsey Berthold, please advise if CT is with or without contrast. Thanks.

## 2018-05-01 ENCOUNTER — Ambulatory Visit (INDEPENDENT_AMBULATORY_CARE_PROVIDER_SITE_OTHER): Payer: Medicare Other | Admitting: Pulmonary Disease

## 2018-05-01 ENCOUNTER — Encounter: Payer: Self-pay | Admitting: Pulmonary Disease

## 2018-05-01 VITALS — BP 132/90 | HR 76 | Ht 63.5 in | Wt 204.0 lb

## 2018-05-01 DIAGNOSIS — F17200 Nicotine dependence, unspecified, uncomplicated: Secondary | ICD-10-CM | POA: Insufficient documentation

## 2018-05-01 DIAGNOSIS — E041 Nontoxic single thyroid nodule: Secondary | ICD-10-CM | POA: Diagnosis not present

## 2018-05-01 DIAGNOSIS — F17290 Nicotine dependence, other tobacco product, uncomplicated: Secondary | ICD-10-CM

## 2018-05-01 DIAGNOSIS — J181 Lobar pneumonia, unspecified organism: Secondary | ICD-10-CM | POA: Diagnosis not present

## 2018-05-01 DIAGNOSIS — R918 Other nonspecific abnormal finding of lung field: Secondary | ICD-10-CM | POA: Diagnosis not present

## 2018-05-01 NOTE — Telephone Encounter (Signed)
Will close encounter, as nothing further is needed.  

## 2018-05-01 NOTE — Progress Notes (Signed)
Subjective:    Patient ID: Anita Harrell, female    DOB: [birth year], 68 y.o.   MRN: [id number]  HPI Patient is a 68 year old former cigarette smoker(still active with e-cigarettes) who presents for follow-up of a cough and abnormal chest CT. Her primary physician is Dr. Loura Pardon.   We first evaluated her on 22 November.  At that time she appeared acutely ill and was being treated with doxycycline that had  made her symptoms somewhat better. She had associated leukocytosis with the findings (as high as 19.8). Patient underwent chest x-ray on 18 November that showed a density on the left upper lobe.  Thiswas followed by a chest CT on 21 November to 19.  This showed a dense infiltrate/mass on the left upper lobe with some areas of groundglass attenuation around this and some bronchograms noted.  She was treated with Augmentin with the working impression that this was a developing necrotizing pneumonia. Since then she has done well.  Fever, chills and sweats have resolved.  Cough has resolved.  During her last visit she still had some dyspnea however this has also resolved. She was also noted to be significantly tachycardic during her initial visit this has also resolved.  She is currently on Toprol-XL.  She had a repeat CT scan the chest on 20 February 2018.  This showed that the area in question had greatly reduced in size.  The patient underwent follow-up CT on 23 April 2018.  This shows continued regression of this finding and now developing a scar.  This confirms that the finding in question was a necrotizing pneumonia now resolving.  As noted the patient has had no associated symptoms since her initial presentation.  Cough has resolved.  Dyspnea has resolved.  She has had no fevers, chills or sweats.  There has been no cough or sputum production.  No hemoptysis.   She had pulmonary function testing on the 11 February.  This shows no evidence of obstruction normal diffusion capacity and  normal lung volumes.  Today she feels well and looks well.  Of note CT scan performed on 11 February continues to mention a right thyroid nodule that should be followed with ultrasound.  This was discussed with the patient.  She has had prior partial thyroidectomy due to thyroid nodules in the past.  Review of Systems  Constitutional: Negative.   HENT: Negative.   Eyes: Negative.   Respiratory: Negative.   Cardiovascular: Negative.   Gastrointestinal: Negative.   Genitourinary: Negative.   Neurological: Negative.   Hematological: Negative.   Psychiatric/Behavioral: Negative.   All other systems reviewed and are negative.      Objective:   Physical Exam Constitutional:      Appearance: She is well-developed. She is obese.  HENT:     Head: Normocephalic and atraumatic.     Mouth/Throat:     Mouth: Mucous membranes are moist.     Pharynx: Oropharynx is clear.  Eyes:     Pupils: Pupils are equal, round, and reactive to light.  Neck:     Musculoskeletal: Neck supple.     Vascular: No JVD.     Trachea: No tracheal deviation.  Cardiovascular:     Rate and Rhythm: Normal rate and regular rhythm.     Pulses: Normal pulses.     Heart sounds: Normal heart sounds.  Pulmonary:     Effort: Pulmonary effort is normal.     Breath sounds: Normal breath sounds.  Abdominal:  General: Abdomen is protuberant. There is no distension.  Musculoskeletal: Normal range of motion.     Right lower leg: No edema.     Left lower leg: No edema.  Lymphadenopathy:     Cervical: No cervical adenopathy.  Skin:    General: Skin is warm and dry.  Neurological:     General: No focal deficit present.     Mental Status: She is alert and oriented to person, place, and time.  Psychiatric:        Mood and Affect: Mood normal.        Behavior: Behavior normal.           Assessment & Plan:   1)Lung mass/infiltrate left upper lobe:  This was due to a dense necrotizing pneumonia.  Repeat CT  scan of 23 April 2018 shows continued improvement with developing of scar.  This will need to be followed up we will see her in follow-up in 4 months time with a follow-up CT scan of the chest at that time.  He is to contact us prior to that time should any new difficulties arise.  2) Pneumonia, no organism specified: Symptoms of pneumonia have improved with Augmentin.  No further need for antibiotics.  It was a necrotizing pneumonia that now has resolved.  3) Tachycardia: Has resolved with the use of beta-blocker.  Continue beta-blocker as instructed by Dr. Glori Bickers.  4) Dyspnea on exertion:  He had relatively normal PFTs.  Symptom has resolved as well.  5) Thyroid nodule, right: Will obtain thyroid ultrasound as recommended by radiology.  Patient has had partial thyroidectomy due to thyroid nodules in the past.  May need biopsy.  6) Nicotine dependence in the form of e-cigarettes: She was counseled with regards to discontinuing all nicotine-containing products.

## 2018-05-01 NOTE — Patient Instructions (Signed)
1.  Your CT scan of the chest is improving.  We will recheck another scan in 4 months.   2.  You do have a nodule on the right side of your thyroid, we will schedule a thyroid ultrasound.  3.  Your breathing tests were okay.  4.  We will see you in follow-up in 4 months time with your CT scan of the chest.

## 2018-05-02 ENCOUNTER — Encounter: Payer: Self-pay | Admitting: Pulmonary Disease

## 2018-05-06 ENCOUNTER — Ambulatory Visit
Admission: RE | Admit: 2018-05-06 | Discharge: 2018-05-06 | Disposition: A | Discharge status: Home / Self Care | Payer: Medicare Other | Source: Ambulatory Visit | Attending: Pulmonary Disease | Admitting: Pulmonary Disease

## 2018-05-06 DIAGNOSIS — E042 Nontoxic multinodular goiter: Secondary | ICD-10-CM | POA: Diagnosis not present

## 2018-05-06 DIAGNOSIS — E041 Nontoxic single thyroid nodule: Secondary | ICD-10-CM | POA: Insufficient documentation

## 2018-05-07 ENCOUNTER — Telehealth: Payer: Self-pay

## 2018-05-07 NOTE — Telephone Encounter (Signed)
-----   Message from Tyler Pita, MD sent at 05/07/2018  2:58 PM EST ----- Thyroid U/S should be repeated in one year. Nothing to biopsy.

## 2018-05-07 NOTE — Telephone Encounter (Signed)
Spoke to patient, she is aware of thyroid u/s results. Nothing further needed at this time.

## 2018-06-25 ENCOUNTER — Telehealth: Payer: Self-pay

## 2018-06-25 ENCOUNTER — Other Ambulatory Visit: Payer: Self-pay

## 2018-06-25 ENCOUNTER — Ambulatory Visit (INDEPENDENT_AMBULATORY_CARE_PROVIDER_SITE_OTHER): Payer: Medicare Other | Admitting: Family Medicine

## 2018-06-25 ENCOUNTER — Encounter: Payer: Self-pay | Admitting: Family Medicine

## 2018-06-25 VITALS — BP 106/58 | HR 89 | Temp 98.4°F | Ht 63.5 in | Wt 201.1 lb

## 2018-06-25 DIAGNOSIS — R300 Dysuria: Secondary | ICD-10-CM | POA: Diagnosis not present

## 2018-06-25 DIAGNOSIS — N3000 Acute cystitis without hematuria: Secondary | ICD-10-CM | POA: Diagnosis not present

## 2018-06-25 DIAGNOSIS — N390 Urinary tract infection, site not specified: Secondary | ICD-10-CM | POA: Insufficient documentation

## 2018-06-25 LAB — POC URINALSYSI DIPSTICK (AUTOMATED)
Bilirubin, UA: NEGATIVE
Blood, UA: 100
Glucose, UA: NEGATIVE
Ketones, UA: NEGATIVE
Nitrite, UA: NEGATIVE
Protein, UA: POSITIVE — AB
Spec Grav, UA: 1.02 (ref 1.010–1.025)
Urobilinogen, UA: 0.2 E.U./dL
pH, UA: 6 (ref 5.0–8.0)

## 2018-06-25 MED ORDER — SULFAMETHOXAZOLE-TRIMETHOPRIM 800-160 MG PO TABS: 1.0000 | ORAL_TABLET | Freq: Two times a day (BID) | ORAL | 0 refills | Status: DC

## 2018-06-25 NOTE — Assessment & Plan Note (Signed)
With dysuria and chills and pos ua  cx pending  tx with bactrim DS Enc fluids Watch closely for flank pain Anita Harrell /vomiting Update if not starting to improve in several days  or if worsening

## 2018-06-25 NOTE — Telephone Encounter (Signed)
Inbound call to triage - urinary retention, painful urination, chills  Denies headache, fever, vomiting, nausea.  Taking Tylenol and Aocept OTC.   Office appt scheduled 06/25/18 @ 1500

## 2018-06-25 NOTE — Telephone Encounter (Signed)
I will see her then  

## 2018-06-25 NOTE — Progress Notes (Signed)
Subjective:    Patient ID: Anita Harrell, female    DOB: [birth year], 68 y.o.   MRN: [id number]  HPI Here for urinary symptoms   Trouble passing urine - 4 am -took a well  Burning with urination  Chills but no fever  Nausea (vomited coke)   No flank pain  No bladder pain   Took azo otc-helped a little with urinary pain     Results for orders placed or performed in visit on 06/25/18  POCT Urinalysis Dipstick (Automated)  Result Value Ref Range   Color, UA Yellow    Clarity, UA Cloudy    Glucose, UA Negative Negative   Bilirubin, UA Negative    Ketones, UA Negative    Spec Grav, UA 1.020 1.010 - 1.025   Blood, UA 100 Ery/uL    pH, UA 6.0 5.0 - 8.0   Protein, UA Positive (A) Negative   Urobilinogen, UA 0.2 0.2 or 1.0 E.U./dL   Nitrite, UA Negative    Leukocytes, UA Large (3+) (A) Negative     Patient Active Problem List   Diagnosis Date Noted  . UTI (urinary tract infection) 06/25/2018  . Thyroid nodule 05/01/2018  . Nicotine dependence 05/01/2018  . Abnormal findings on diagnostic imaging of lung 05/01/2018  . Electronic cigarette use 04/03/2018  . Obesity (BMI 30-39.9) 04/03/2018  . Lung mass 02/01/2018  . Abnormal chest x-ray 01/29/2018  . Tachycardia 11/01/2015  . Microscopic hematuria 08/13/2012  . Routine general medical examination at a health care facility 07/22/2012  . Leukocytosis 03/30/2010  . Hyperglycemia 02/21/2008  . HYPERTENSION, BENIGN ESSENTIAL 01/22/2008  . ALLERGIC RHINITIS 07/25/2007  . BACK PAIN 06/26/2007  . Personal history of goiter 07/27/2006  . Hyperlipidemia 07/27/2006  . Anxiety disorder 07/27/2006  . PANIC DISORDER 07/27/2006  . Former smoker 07/27/2006  . Depression with anxiety 07/27/2006  . INSOMNIA, HX OF 07/27/2006  . Personal history of colonic polyps 06/09/2004   Past Medical History:  Diagnosis Date  . Allergy    allergic rhinitis  . Anxiety   . Back pain   . Chest pain   . Depression   . Epigastric pain    . Goiter   . History of tobacco abuse   . Hyperglycemia    mild  . Hyperlipidemia   . Hypertension   . Insomnia    Hx of  . Labile blood pressure   . Panic disorder    History of  . Personal history of colonic polyps 06/09/2004  . SVD (spontaneous vaginal delivery)    x 2   Past Surgical History:  Procedure Laterality Date  . ABDOMINAL HYSTERECTOMY N/A 09/24/2012   Procedure: HYSTERECTOMY ABDOMINAL;  Surgeon: Gus Height, MD;  Location: [facility];  Service: Gynecology;  Laterality: N/A;  . BREAST BIOPSY    . COLONOSCOPY    . COLONOSCOPY  2017  . ELBOW SURGERY  2004  . EYE SURGERY     bilateral lasik  . SALPINGOOPHORECTOMY Bilateral 09/24/2012   Procedure: SALPINGO OOPHORECTOMY;  Surgeon: Gus Height, MD;  Location: [facility];  Service: Gynecology;  Laterality: Bilateral;  . THYROID SURGERY     B9 massess  . WART FULGURATION N/A 09/24/2012   Procedure: FULGURATION VAGINAL WART;  Surgeon: Gus Height, MD;  Location: [facility];  Service: Gynecology;  Laterality: N/A;  . WISDOM TOOTH EXTRACTION     Social History   Tobacco Use  . Smoking status: Former Smoker    Packs/day: 1.00    Years:  20.00    Pack years: 20.00    Types: Cigarettes    Last attempt to quit: 12/11/2008    Years since quitting: 9.5  . Smokeless tobacco: Never Used  Substance Use Topics  . Alcohol use: No    Alcohol/week: 0.0 standard drinks  . Drug use: No   Family History  Problem Relation Age of Onset  . Hypertension Mother   . Stroke Mother   . Alcohol abuse Father   . Cancer Father        lung CA  . Heart disease Father        CHF  . Cancer Sister        breast CA  . Heart disease Sister        CHF from chemotx also has defib  . Breast cancer Sister   . Colon cancer Neg Hx   . Esophageal cancer Neg Hx   . Rectal cancer Neg Hx   . Stomach cancer Neg Hx    Allergies  Allergen Reactions  . Codeine Nausea And Vomiting    REACTION: Nausea and vomiting Pt has tolerated vicodin & percocet in the past   . Lipitor [Atorvastatin]     Muscle pain   . Wellbutrin [Bupropion]    Current Outpatient Medications on File Prior to Visit  Medication Sig Dispense Refill  . albuterol (PROAIR HFA) 108 (90 Base) MCG/ACT inhaler Inhale 2 puffs into the lungs every 6 (six) hours as needed (cough). 18 g 1  . aspirin 81 MG tablet Take 81 mg by mouth daily.    . busPIRone (BUSPAR) 15 MG tablet Take 1 tablet (15 mg total) by mouth 3 (three) times daily. 270 tablet 3  . lisinopril-hydrochlorothiazide (PRINZIDE,ZESTORETIC) 10-12.5 MG tablet Take 0.5 tablets by mouth daily. 45 tablet 3  . metoprolol succinate (TOPROL-XL) 50 MG 24 hr tablet Take 1 tablet (50 mg total) by mouth daily. Take with or immediately following a meal. 90 tablet 3  . Omega-3 Fatty Acids (FISH OIL PO) Take 1 capsule by mouth daily.    Marland Kitchen PARoxetine (PAXIL) 40 MG tablet TAKE 1.5 TABLETS BY MOUTH ONCE DAILY 135 tablet 3  . rosuvastatin (CRESTOR) 10 MG tablet TAKE 1 TABLET BY MOUTH ONCE DAILY 90 tablet 3  . solifenacin (VESICARE) 10 MG tablet Take 1 tablet (10 mg total) by mouth daily. 30 tablet 11  . zolpidem (AMBIEN) 10 MG tablet TAKE 1 TABLET BY MOUTH AT BEDTIME AS NEEDED 30 tablet 3   No current facility-administered medications on file prior to visit.     Review of Systems  Constitutional: Positive for fatigue. Negative for activity change, appetite change and fever.  HENT: Negative for congestion and sore throat.   Eyes: Negative for itching and visual disturbance.  Respiratory: Negative for cough and shortness of breath.   Cardiovascular: Negative for leg swelling.  Gastrointestinal: Negative for abdominal distention, abdominal pain, constipation, diarrhea and nausea.  Endocrine: Negative for cold intolerance and polydipsia.  Genitourinary: Positive for dysuria, frequency and urgency. Negative for difficulty urinating, flank pain, genital sores and hematuria.  Musculoskeletal: Negative for myalgias.  Skin: Negative for rash.   Allergic/Immunologic: Negative for immunocompromised state.  Neurological: Negative for dizziness and weakness.  Hematological: Negative for adenopathy.       Objective:   Physical Exam Constitutional:      General: She is not in acute distress.    Appearance: She is well-developed.  HENT:     Head: Normocephalic and atraumatic.  Eyes:  Conjunctiva/sclera: Conjunctivae normal.     Pupils: Pupils are equal, round, and reactive to light.  Neck:     Musculoskeletal: Normal range of motion and neck supple.  Cardiovascular:     Rate and Rhythm: Normal rate and regular rhythm.     Heart sounds: Normal heart sounds.  Pulmonary:     Effort: Pulmonary effort is normal.     Breath sounds: Normal breath sounds.  Abdominal:     General: Bowel sounds are normal. There is no distension.     Palpations: Abdomen is soft.     Tenderness: There is abdominal tenderness. There is no right CVA tenderness, left CVA tenderness or rebound.     Comments: No cva tenderness  Mild suprapubic tenderness without fullness  Genitourinary:    Comments: No rash or irritation on labia or urethra on inspection Lymphadenopathy:     Cervical: No cervical adenopathy.  Skin:    Findings: No rash.  Neurological:     Mental Status: She is alert.  Psychiatric:     Comments: Mildly anxious            Assessment & Plan:   Problem List Items Addressed This Visit      Genitourinary   UTI (urinary tract infection) - Primary    With dysuria and chills and pos ua  cx pending  tx with bactrim DS Enc fluids Watch closely for flank pain Levie Heritage /vomiting Update if not starting to improve in several days  or if worsening        Relevant Medications   sulfamethoxazole-trimethoprim (BACTRIM DS,SEPTRA DS) 800-160 MG tablet   Other Relevant Orders   Urine Culture    Other Visit Diagnoses    Dysuria       Relevant Orders   POCT Urinalysis Dipstick (Automated) (Completed)

## 2018-06-25 NOTE — Patient Instructions (Addendum)
Drink lots of water  Take the bactrim DS as directed  If symptoms worsen or you develop a fever- let me know  Azo is ok for a couple days -(for pain)-no more than that   We will call when your urine culture returns    Urinary Tract Infection, Adult  A urinary tract infection (UTI) is an infection of any part of the urinary tract. The urinary tract includes the kidneys, ureters, bladder, and urethra. These organs make, store, and get rid of urine in the body. Your health care provider may use other names to describe the infection. An upper UTI affects the ureters and kidneys (pyelonephritis). A lower UTI affects the bladder (cystitis) and urethra (urethritis). What are the causes? Most urinary tract infections are caused by bacteria in your genital area, around the entrance to your urinary tract (urethra). These bacteria grow and cause inflammation of your urinary tract. What increases the risk? You are more likely to develop this condition if:  You have a urinary catheter that stays in place (indwelling).  You are not able to control when you urinate or have a bowel movement (you have incontinence).  You are female and you: ? Use a spermicide or diaphragm for birth control. ? Have low estrogen levels. ? Are pregnant.  You have certain genes that increase your risk (genetics).  You are sexually active.  You take antibiotic medicines.  You have a condition that causes your flow of urine to slow down, such as: ? An enlarged prostate, if you are female. ? Blockage in your urethra (stricture). ? A kidney stone. ? A nerve condition that affects your bladder control (neurogenic bladder). ? Not getting enough to drink, or not urinating often.  You have certain medical conditions, such as: ? Diabetes. ? A weak disease-fighting system (immunesystem). ? Sickle cell disease. ? Gout. ? Spinal cord injury. What are the signs or symptoms? Symptoms of this condition include:  Needing to  urinate right away (urgently).  Frequent urination or passing small amounts of urine frequently.  Pain or burning with urination.  Blood in the urine.  Urine that smells bad or unusual.  Trouble urinating.  Cloudy urine.  Vaginal discharge, if you are female.  Pain in the abdomen or the lower back. You may also have:  Vomiting or a decreased appetite.  Confusion.  Irritability or tiredness.  A fever.  Diarrhea. The first symptom in older adults may be confusion. In some cases, they may not have any symptoms until the infection has worsened. How is this diagnosed? This condition is diagnosed based on your medical history and a physical exam. You may also have other tests, including:  Urine tests.  Blood tests.  Tests for sexually transmitted infections (STIs). If you have had more than one UTI, a cystoscopy or imaging studies may be done to determine the cause of the infections. How is this treated? Treatment for this condition includes:  Antibiotic medicine.  Over-the-counter medicines to treat discomfort.  Drinking enough water to stay hydrated. If you have frequent infections or have other conditions such as a kidney stone, you may need to see a health care provider who specializes in the urinary tract (urologist). In rare cases, urinary tract infections can cause sepsis. Sepsis is a life-threatening condition that occurs when the body responds to an infection. Sepsis is treated in the hospital with IV antibiotics, fluids, and other medicines. Follow these instructions at home:  Medicines  Take over-the-counter and prescription medicines only  as told by your health care provider.  If you were prescribed an antibiotic medicine, take it as told by your health care provider. Do not stop using the antibiotic even if you start to feel better. General instructions  Make sure you: ? Empty your bladder often and completely. Do not hold urine for long periods of  time. ? Empty your bladder after sex. ? Wipe from front to back after a bowel movement if you are female. Use each tissue one time when you wipe.  Drink enough fluid to keep your urine pale yellow.  Keep all follow-up visits as told by your health care provider. This is important. Contact a health care provider if:  Your symptoms do not get better after 1-2 days.  Your symptoms go away and then return. Get help right away if you have:  Severe pain in your back or your lower abdomen.  A fever.  Nausea or vomiting. Summary  A urinary tract infection (UTI) is an infection of any part of the urinary tract, which includes the kidneys, ureters, bladder, and urethra.  Most urinary tract infections are caused by bacteria in your genital area, around the entrance to your urinary tract (urethra).  Treatment for this condition often includes antibiotic medicines.  If you were prescribed an antibiotic medicine, take it as told by your health care provider. Do not stop using the antibiotic even if you start to feel better.  Keep all follow-up visits as told by your health care provider. This is important. This information is not intended to replace advice given to you by your health care provider. Make sure you discuss any questions you have with your health care provider. Document Released: 12/07/2004 Document Revised: 09/06/2017 Document Reviewed: 09/06/2017 Elsevier Interactive Patient Education  2019 [employer].

## 2018-06-27 ENCOUNTER — Telehealth: Payer: Self-pay | Admitting: *Deleted

## 2018-06-27 LAB — URINE CULTURE
MICRO NUMBER:: 394418
SPECIMEN QUALITY:: ADEQUATE

## 2018-06-27 NOTE — Telephone Encounter (Signed)
Left VM requesting pt to call back regarding urine cx °

## 2018-08-20 ENCOUNTER — Ambulatory Visit
Admission: RE | Admit: 2018-08-20 | Discharge: 2018-08-20 | Disposition: A | Discharge status: Home / Self Care | Payer: Medicare Other | Source: Ambulatory Visit | Attending: Pulmonary Disease | Admitting: Pulmonary Disease

## 2018-08-20 ENCOUNTER — Other Ambulatory Visit: Payer: Self-pay

## 2018-08-20 DIAGNOSIS — R918 Other nonspecific abnormal finding of lung field: Secondary | ICD-10-CM | POA: Insufficient documentation

## 2018-08-20 DIAGNOSIS — J439 Emphysema, unspecified: Secondary | ICD-10-CM | POA: Diagnosis not present

## 2018-08-21 ENCOUNTER — Telehealth: Payer: Self-pay

## 2018-08-21 DIAGNOSIS — R918 Other nonspecific abnormal finding of lung field: Secondary | ICD-10-CM

## 2018-08-21 NOTE — Telephone Encounter (Signed)
-----   Message from Tyler Pita, MD sent at 08/21/2018  8:35 AM EDT ----- Stable CT chest repeat in 6 months.

## 2018-08-21 NOTE — Telephone Encounter (Signed)
Called and spoke to patient, she is aware of results. Will order repeat CT in 6 months. Nothing further at this time.

## 2018-09-06 ENCOUNTER — Other Ambulatory Visit: Payer: Self-pay | Admitting: Family Medicine

## 2018-09-09 NOTE — Telephone Encounter (Signed)
Name of Medication: Zolpidem Name of Pharmacy: [facility] or Written Date and Quantity: 04/08/18 #30 tabs with 3 refills Last Office Visit and Type: ? UTI on 06/25/18 Next Office Visit and Type: no future appointments Last Controlled Substance Agreement Date: 07/13/2015          Last UDS: 07/13/2015

## 2018-10-14 ENCOUNTER — Other Ambulatory Visit: Payer: Self-pay

## 2018-12-09 ENCOUNTER — Other Ambulatory Visit: Payer: Self-pay | Admitting: Family Medicine

## 2018-12-09 NOTE — Telephone Encounter (Signed)
Does/should she have 1 more refill remaining on this?

## 2018-12-09 NOTE — Telephone Encounter (Signed)
Name of Medication:Zolpidem Name of [facility] Date and Quantity:09/09/18 #30 tabs with 3 refills Last Office Visit and Type:? UTI on 06/25/18 Next Office Visit and Type:CPE on 01/16/19 Last Controlled Substance Agreement Date:07/13/2015 Last UDS:07/13/2015

## 2019-01-06 ENCOUNTER — Other Ambulatory Visit: Payer: Self-pay | Admitting: Family Medicine

## 2019-01-08 ENCOUNTER — Telehealth (INDEPENDENT_AMBULATORY_CARE_PROVIDER_SITE_OTHER): Payer: Medicare Other | Admitting: Family Medicine

## 2019-01-08 ENCOUNTER — Other Ambulatory Visit (INDEPENDENT_AMBULATORY_CARE_PROVIDER_SITE_OTHER): Payer: Medicare Other

## 2019-01-08 DIAGNOSIS — I10 Essential (primary) hypertension: Secondary | ICD-10-CM

## 2019-01-08 DIAGNOSIS — R7303 Prediabetes: Secondary | ICD-10-CM | POA: Diagnosis not present

## 2019-01-08 LAB — COMPREHENSIVE METABOLIC PANEL
ALT: 17 U/L (ref 0–35)
AST: 15 U/L (ref 0–37)
Albumin: 4.4 g/dL (ref 3.5–5.2)
Alkaline Phosphatase: 60 U/L (ref 39–117)
BUN: 10 mg/dL (ref 6–23)
CO2: 26 mEq/L (ref 19–32)
Calcium: 10 mg/dL (ref 8.4–10.5)
Chloride: 101 mEq/L (ref 96–112)
Creatinine, Ser: 0.66 mg/dL (ref 0.40–1.20)
GFR: 88.58 mL/min (ref >60.00)
Glucose, Bld: 182 mg/dL — ABNORMAL HIGH (ref 70–99)
Potassium: 4.2 mEq/L (ref 3.5–5.1)
Sodium: 137 mEq/L (ref 135–145)
Total Bilirubin: 0.5 mg/dL (ref 0.2–1.2)
Total Protein: 7.2 g/dL (ref 6.0–8.3)

## 2019-01-08 LAB — CBC WITH DIFFERENTIAL/PLATELET
Basophils Absolute: 0.1 10*3/uL (ref 0.0–0.1)
Basophils Relative: 0.6 % (ref 0.0–3.0)
Eosinophils Absolute: 0 10*3/uL (ref 0.0–0.7)
Eosinophils Relative: 0.4 % (ref 0.0–5.0)
HCT: 40.6 % (ref 36.0–46.0)
Hemoglobin: 13.6 g/dL (ref 12.0–15.0)
Lymphocytes Relative: 21.4 % (ref 12.0–46.0)
Lymphs Abs: 2.4 10*3/uL (ref 0.7–4.0)
MCHC: 33.5 g/dL (ref 30.0–36.0)
MCV: 88.1 fl (ref 78.0–100.0)
Monocytes Absolute: 2.8 10*3/uL — ABNORMAL HIGH (ref 0.1–1.0)
Monocytes Relative: 24.9 % — ABNORMAL HIGH (ref 3.0–12.0)
Neutro Abs: 5.9 10*3/uL (ref 1.4–7.7)
Neutrophils Relative %: 52.7 % (ref 43.0–77.0)
Platelets: 220 10*3/uL (ref 150.0–400.0)
RBC: 4.61 Mil/uL (ref 3.87–5.11)
RDW: 13.7 % (ref 11.5–15.5)
WBC: 11.2 10*3/uL — ABNORMAL HIGH (ref 4.0–10.5)

## 2019-01-08 LAB — LIPID PANEL
Cholesterol: 133 mg/dL (ref 0–200)
HDL: 44.2 mg/dL (ref >39.00)
LDL Cholesterol: 54 mg/dL (ref 0–99)
NonHDL: 88.89
Total CHOL/HDL Ratio: 3
Triglycerides: 173 mg/dL — ABNORMAL HIGH (ref 0.0–149.0)
VLDL: 34.6 mg/dL (ref 0.0–40.0)

## 2019-01-08 LAB — HEMOGLOBIN A1C: Hgb A1c MFr Bld: 6.3 % (ref 4.6–6.5)

## 2019-01-08 NOTE — Telephone Encounter (Signed)
llab order

## 2019-01-09 LAB — TSH: TSH: 1.89 u[IU]/mL (ref 0.35–4.50)

## 2019-01-16 ENCOUNTER — Ambulatory Visit (INDEPENDENT_AMBULATORY_CARE_PROVIDER_SITE_OTHER): Payer: Medicare Other | Admitting: Family Medicine

## 2019-01-16 ENCOUNTER — Other Ambulatory Visit: Payer: Self-pay

## 2019-01-16 ENCOUNTER — Encounter: Payer: Self-pay | Admitting: Family Medicine

## 2019-01-16 VITALS — BP 116/72 | HR 84 | Temp 97.3°F | Ht 63.5 in | Wt 209.6 lb

## 2019-01-16 DIAGNOSIS — Z0000 Encounter for general adult medical examination without abnormal findings: Secondary | ICD-10-CM

## 2019-01-16 DIAGNOSIS — R000 Tachycardia, unspecified: Secondary | ICD-10-CM

## 2019-01-16 DIAGNOSIS — E7800 Pure hypercholesterolemia, unspecified: Secondary | ICD-10-CM | POA: Diagnosis not present

## 2019-01-16 DIAGNOSIS — I10 Essential (primary) hypertension: Secondary | ICD-10-CM

## 2019-01-16 DIAGNOSIS — Z789 Other specified health status: Secondary | ICD-10-CM

## 2019-01-16 DIAGNOSIS — Z23 Encounter for immunization: Secondary | ICD-10-CM | POA: Diagnosis not present

## 2019-01-16 DIAGNOSIS — D72821 Monocytosis (symptomatic): Secondary | ICD-10-CM

## 2019-01-16 DIAGNOSIS — F419 Anxiety disorder, unspecified: Secondary | ICD-10-CM | POA: Diagnosis not present

## 2019-01-16 DIAGNOSIS — F418 Other specified anxiety disorders: Secondary | ICD-10-CM

## 2019-01-16 DIAGNOSIS — E669 Obesity, unspecified: Secondary | ICD-10-CM | POA: Diagnosis not present

## 2019-01-16 DIAGNOSIS — R7303 Prediabetes: Secondary | ICD-10-CM | POA: Diagnosis not present

## 2019-01-16 MED ORDER — PAROXETINE HCL 40 MG PO TABS: ORAL_TABLET | ORAL | 3 refills | Status: DC

## 2019-01-16 MED ORDER — ROSUVASTATIN CALCIUM 10 MG PO TABS: ORAL_TABLET | ORAL | 3 refills | Status: DC

## 2019-01-16 MED ORDER — BUSPIRONE HCL 30 MG PO TABS: 15.0000 mg | ORAL_TABLET | Freq: Three times a day (TID) | ORAL | 3 refills | Status: DC

## 2019-01-16 MED ORDER — LISINOPRIL-HYDROCHLOROTHIAZIDE 10-12.5 MG PO TABS: 0.5000 | ORAL_TABLET | Freq: Every day | ORAL | 3 refills | Status: DC

## 2019-01-16 MED ORDER — METOPROLOL SUCCINATE ER 50 MG PO TB24: 50.0000 mg | ORAL_TABLET | Freq: Every day | ORAL | 3 refills | Status: DC

## 2019-01-16 NOTE — Assessment & Plan Note (Signed)
Discussed how this problem influences overall health and the risks it imposes  Reviewed plan for weight loss with lower calorie diet (via better food choices and also portion control or program like weight watchers) and exercise building up to or more than 30 minutes 5 days per week including some aerobic activity    

## 2019-01-16 NOTE — Assessment & Plan Note (Signed)
Disc goals for lipids and reasons to control them Rev last labs with pt Rev low sat fat diet in detail Good control with crestor and diet  

## 2019-01-16 NOTE — Assessment & Plan Note (Signed)
Continues to be much improved with buspar  She would like to inc dose so she can titrate it higher if /when needed  Inc to 30 mg 1/2 -1 pill tid   Reviewed stressors/ coping techniques/symptoms/ support sources/ tx options and side effects in detail today  Better coping skills Enc her to keep working on exercise

## 2019-01-16 NOTE — Assessment & Plan Note (Signed)
bp in fair control at this time  BP Readings from Last 1 Encounters:  01/16/19 116/72   No changes needed Most recent labs reviewed  Disc lifstyle change with low sodium diet and exercise

## 2019-01-16 NOTE — Assessment & Plan Note (Addendum)
Lab Results  Component Value Date   HGBA1C 6.3 01/08/2019   This is stable -however fasting glucose was over 200 after eating much candy the day before  disc imp of low glycemic diet and wt loss to prevent DM2

## 2019-01-16 NOTE — Patient Instructions (Addendum)
Don't forget to schedule your mammogram   You can increase buspar to 15-30 mg three times daily   Try to get 1200-1500 mg of calcium per day with at least 1000 iu of vitamin D - for bone health   Take care of yourself  Flu shot today    Try to gradually increase exercise  Get outdoors when you can Labs are stable sugar is up   Try to get most of your carbohydrates from produce (with the exception of white potatoes)  Eat less bread/pasta/rice/snack foods/cereals/sweets and other items from the middle of the grocery store (processed carbs)

## 2019-01-16 NOTE — Assessment & Plan Note (Signed)
Still well controlled with metoprolol xl

## 2019-01-16 NOTE — Assessment & Plan Note (Signed)
Reviewed health habits including diet and exercise and skin cancer prevention Reviewed appropriate screening tests for age  Also reviewed health mt list, fam hx and immunization status , as well as social and family history   Labs reviewed  Flu vaccine given  Pt declines dexa (no falls or fx)  Disc need for ca and D replacement  Enc exercise  Advance directive is utd  No cognitive concerns - did disc some night time dreams/ ? Hallucinations-will watch this (she does take ambien) Wt loss enc Hearing screen nl  Vision screen nl

## 2019-01-16 NOTE — Progress Notes (Signed)
Subjective:    Patient ID: Anita Harrell, female    DOB: 1950-08-08, 68 y.o.   MRN: [id number]  HPI  Here for amw and review of chronic health problems   I have personally reviewed the Medicare Annual Wellness questionnaire and have noted 1. The patient's medical and social history 2. Their use of alcohol, tobacco or illicit drugs 3. Their current medications and supplements 4. The patient's functional ability including ADL's, fall risks, home safety risks and hearing or visual             impairment. 5. Diet and physical activities 6. Evidence for depression or mood disorders  The patients weight, height, BMI have been recorded in the chart and visual acuity is per eye clinic.  I have made referrals, counseling and provided education to the patient based review of the above and I have provided the pt with a written personalized care plan for preventive services. Reviewed and updated provider list, see scanned forms.  See scanned forms.  Routine anticipatory guidance given to patient.  See health maintenance. Colon cancer screening  Colonoscopy 9/17 with 5 y recall  Breast cancer screening  Mammogram 3/19- needs to schedule  Self breast exam- no lumps  Flu vaccine- today  Tetanus vaccine 11/13/Tdap Pneumovax completed Zoster vaccine  10/14 -zostavax  Dexa-not interested  Falls-none Fractures-none  Supplements takes no D or ca -plans to start Exercise - using exercise bike for short periods  Now doing some deep cleaning at home  Also has to walk her puppy   Advance directive-has a living will and poa  Cognitive function addressed- see scanned forms- and if abnormal then additional documentation follows.  She misplaces thing occasionally -otherwise no problems  She handles her own affairs Not getting confused or lost  She had a month when she had very vivid dreams (? Hallucinations) - but was aware of that She does take [facility] and [place] reviewed  Meds, vitals, and  allergies reviewed.   ROS: See HPI.  Otherwise negative.   Smoking status  -occ vapes (not often)  Most of the time no nicotine    Weight : Wt Readings from Last 3 Encounters:  01/16/19 209 lb 9 oz (95.1 kg)  06/25/18 201 lb 2 oz (91.2 kg)  05/01/18 204 lb (92.5 kg)  wt is up  Eating more  Plans to get more exercise  36.54 kg/m   Hearing/vision:  Hearing Screening   125Hz  250Hz  500Hz  1000Hz  2000Hz  3000Hz  4000Hz  6000Hz  8000Hz   Right ear:   40 40 40  40    Left ear:   40 40 40  40      Visual Acuity Screening   Right eye Left eye Both eyes  Without correction: 20/20 20/20 20/25   With correction:      She has had RK surgery years ago  Does use reading glasses   Hypertension  bp is stable today  No cp or palpitations or headaches or edema  No side effects to medicines  BP Readings from Last 3 Encounters:  01/16/19 116/72  06/25/18 (!) 106/58  05/01/18 132/90     Pulse Readings from Last 3 Encounters:  01/16/19 84  06/25/18 89  05/01/18 76     Mood/ history of anxiety disorder and dep Takes paxil 60 mg daily buspar -very helpful  ambien for sleep prn  Hyperlipidemia  Lab Results  Component Value Date   CHOL 133 01/08/2019   CHOL 119 12/25/2017  CHOL 116 10/25/2016   Lab Results  Component Value Date   HDL 44.20 01/08/2019   HDL 45.90 12/25/2017   HDL 43.70 10/25/2016   Lab Results  Component Value Date   LDLCALC 54 01/08/2019   LDLCALC 48 12/25/2017   LDLCALC 41 10/25/2016   Lab Results  Component Value Date   TRIG 173.0 (H) 01/08/2019   TRIG 125.0 12/25/2017   TRIG 155.0 (H) 10/25/2016   Lab Results  Component Value Date   CHOLHDL 3 01/08/2019   CHOLHDL 3 12/25/2017   CHOLHDL 3 10/25/2016   Lab Results  Component Value Date   LDLDIRECT 73.0 08/21/2014   crestor and diet  More sugar in diet - may affect triglycerides    Prediabetes Lab Results  Component Value Date   HGBA1C 6.3 01/08/2019  same as a year ago  Glucose was  182 fasting -she has been eating a lot more chocolate-stopped it recently  Ate 3 candy bars the night before  Not buying more    Leukocytosis Lab Results  Component Value Date   WBC 11.2 (H) 01/08/2019   HGB 13.6 01/08/2019   HCT 40.6 01/08/2019   MCV 88.1 01/08/2019   PLT 220.0 01/08/2019   Lab Results  Component Value Date   TSH 1.89 01/08/2019   Patient Active Problem List   Diagnosis Date Noted  . Thyroid nodule 05/01/2018  . Nicotine dependence 05/01/2018  . Abnormal findings on diagnostic imaging of lung 05/01/2018  . Electronic cigarette use 04/03/2018  . Obesity (BMI 30-39.9) 04/03/2018  . Lung mass 02/01/2018  . Abnormal chest x-ray 01/29/2018  . Tachycardia 11/01/2015  . Microscopic hematuria 08/13/2012  . Routine general medical examination at a health care facility 07/22/2012  . Leukocytosis 03/30/2010  . Prediabetes 02/21/2008  . HYPERTENSION, BENIGN ESSENTIAL 01/22/2008  . ALLERGIC RHINITIS 07/25/2007  . BACK PAIN 06/26/2007  . Personal history of goiter 07/27/2006  . Hyperlipidemia 07/27/2006  . Anxiety disorder 07/27/2006  . PANIC DISORDER 07/27/2006  . Former smoker 07/27/2006  . Depression with anxiety 07/27/2006  . INSOMNIA, HX OF 07/27/2006  . Personal history of colonic polyps 06/09/2004   Past Medical History:  Diagnosis Date  . Allergy    allergic rhinitis  . Anxiety   . Back pain   . Chest pain   . Depression   . Epigastric pain   . Goiter   . History of tobacco abuse   . Hyperglycemia    mild  . Hyperlipidemia   . Hypertension   . Insomnia    Hx of  . Labile blood pressure   . Panic disorder    History of  . Personal history of colonic polyps 06/09/2004  . SVD (spontaneous vaginal delivery)    x 2   Past Surgical History:  Procedure Laterality Date  . ABDOMINAL HYSTERECTOMY N/A 09/24/2012   Procedure: HYSTERECTOMY ABDOMINAL;  Surgeon: Gus Height, MD;  Location: [facility];  Service: Gynecology;  Laterality: N/A;  . BREAST  BIOPSY    . COLONOSCOPY    . COLONOSCOPY  2017  . ELBOW SURGERY  2004  . EYE SURGERY     bilateral lasik  . SALPINGOOPHORECTOMY Bilateral 09/24/2012   Procedure: SALPINGO OOPHORECTOMY;  Surgeon: Gus Height, MD;  Location: [facility];  Service: Gynecology;  Laterality: Bilateral;  . THYROID SURGERY     B9 massess  . WART FULGURATION N/A 09/24/2012   Procedure: FULGURATION VAGINAL WART;  Surgeon: Gus Height, MD;  Location: [facility];  Service:  Gynecology;  Laterality: N/A;  . WISDOM TOOTH EXTRACTION     Social History   Tobacco Use  . Smoking status: Former Smoker    Packs/day: 1.00    Years: 20.00    Pack years: 20.00    Types: Cigarettes    Quit date: 12/11/2008    Years since quitting: 10.1  . Smokeless tobacco: Never Used  Substance Use Topics  . Alcohol use: No    Alcohol/week: 0.0 standard drinks  . Drug use: No   Family History  Problem Relation Age of Onset  . Hypertension Mother   . Stroke Mother   . Alcohol abuse Father   . Cancer Father        lung CA  . Heart disease Father        CHF  . Cancer Sister        breast CA  . Heart disease Sister        CHF from chemotx also has defib  . Breast cancer Sister   . Colon cancer Neg Hx   . Esophageal cancer Neg Hx   . Rectal cancer Neg Hx   . Stomach cancer Neg Hx    Allergies  Allergen Reactions  . Codeine Nausea And Vomiting    REACTION: Nausea and vomiting Pt has tolerated vicodin & percocet in the past  . Lipitor [Atorvastatin]     Muscle pain   . Wellbutrin [Bupropion]    Current Outpatient Medications on File Prior to Visit  Medication Sig Dispense Refill  . albuterol (PROAIR HFA) 108 (90 Base) MCG/ACT inhaler Inhale 2 puffs into the lungs every 6 (six) hours as needed (cough). 18 g 1  . aspirin 81 MG tablet Take 81 mg by mouth daily.    Marland Kitchen zolpidem (AMBIEN) 10 MG tablet TAKE 1 TABLET BY MOUTH EVERY NIGHT AT BEDTIME AS NEEDED 30 tablet 3   No current facility-administered medications on file prior to visit.       Review of Systems  Constitutional: Negative for activity change, appetite change, fatigue, fever and unexpected weight change.  HENT: Negative for congestion, ear pain, rhinorrhea, sinus pressure and sore throat.   Eyes: Negative for pain, redness and visual disturbance.  Respiratory: Negative for cough, shortness of breath and wheezing.   Cardiovascular: Negative for chest pain and palpitations.  Gastrointestinal: Negative for abdominal pain, blood in stool, constipation and diarrhea.  Endocrine: Negative for polydipsia and polyuria.  Genitourinary: Negative for dysuria, frequency and urgency.  Musculoskeletal: Negative for arthralgias, back pain and myalgias.  Skin: Negative for pallor and rash.  Allergic/Immunologic: Negative for environmental allergies.  Neurological: Negative for dizziness, syncope and headaches.  Hematological: Negative for adenopathy. Does not bruise/bleed easily.  Psychiatric/Behavioral: Negative for decreased concentration and dysphoric mood. The patient is nervous/anxious.        Objective:   Physical Exam Constitutional:      General: She is not in acute distress.    Appearance: Normal appearance. She is well-developed. She is obese. She is not ill-appearing or diaphoretic.  HENT:     Head: Normocephalic and atraumatic.     Right Ear: Tympanic membrane, ear canal and external ear normal.     Left Ear: Tympanic membrane, ear canal and external ear normal.     Nose: Nose normal. No congestion.     Mouth/Throat:     Mouth: Mucous membranes are moist.     Pharynx: Oropharynx is clear. No posterior oropharyngeal erythema.  Eyes:  General: No scleral icterus.    Extraocular Movements: Extraocular movements intact.     Conjunctiva/sclera: Conjunctivae normal.     Pupils: Pupils are equal, round, and reactive to light.  Neck:     Musculoskeletal: Normal range of motion and neck supple. No neck rigidity or muscular tenderness.     Thyroid: No  thyromegaly.     Vascular: No carotid bruit or JVD.  Cardiovascular:     Rate and Rhythm: Normal rate and regular rhythm.     Pulses: Normal pulses.     Heart sounds: Normal heart sounds. No gallop.   Pulmonary:     Effort: Pulmonary effort is normal. No respiratory distress.     Breath sounds: Normal breath sounds. No wheezing.     Comments: Good air exch Chest:     Chest wall: No tenderness.  Abdominal:     General: Bowel sounds are normal. There is no distension or abdominal bruit.     Palpations: Abdomen is soft. There is no mass.     Tenderness: There is no abdominal tenderness.     Hernia: No hernia is present.  Genitourinary:    Comments: Breast exam: No mass, nodules, thickening, tenderness, bulging, retraction, inflamation, nipple discharge or skin changes noted.  No axillary or clavicular LA.     Musculoskeletal: Normal range of motion.        General: No tenderness.     Right lower leg: No edema.     Left lower leg: No edema.  Lymphadenopathy:     Cervical: No cervical adenopathy.  Skin:    General: Skin is warm and dry.     Coloration: Skin is not pale.     Findings: No erythema or rash.     Comments: Solar aging with aks/lentigines diffusely  Some sks (one abraded on R arm)  Neurological:     Mental Status: She is alert. Mental status is at baseline.     Cranial Nerves: No cranial nerve deficit.     Motor: No abnormal muscle tone.     Coordination: Coordination normal.     Gait: Gait normal.     Deep Tendon Reflexes: Reflexes are normal and symmetric. Reflexes normal.  Psychiatric:        Mood and Affect: Mood normal.        Cognition and Memory: Cognition and memory normal.     Comments: Pleasant today -not anxious            Assessment & Plan:   Problem List Items Addressed This Visit      Cardiovascular and Mediastinum   HYPERTENSION, BENIGN ESSENTIAL    bp in fair control at this time  BP Readings from Last 1 Encounters:  01/16/19 116/72    No changes needed Most recent labs reviewed  Disc lifstyle change with low sodium diet and exercise        Relevant Medications   lisinopril-hydrochlorothiazide (ZESTORETIC) 10-12.5 MG tablet   metoprolol succinate (TOPROL-XL) 50 MG 24 hr tablet   rosuvastatin (CRESTOR) 10 MG tablet     Other   Hyperlipidemia    Disc goals for lipids and reasons to control them Rev last labs with pt Rev low sat fat diet in detail  Good control with crestor and diet       Relevant Medications   lisinopril-hydrochlorothiazide (ZESTORETIC) 10-12.5 MG tablet   metoprolol succinate (TOPROL-XL) 50 MG 24 hr tablet   rosuvastatin (CRESTOR) 10 MG tablet   Anxiety disorder  Relevant Medications   busPIRone (BUSPAR) 30 MG tablet   PARoxetine (PAXIL) 40 MG tablet   Depression with anxiety    Continues to be much improved with buspar  She would like to inc dose so she can titrate it higher if /when needed  Inc to 30 mg 1/2 -1 pill tid   Reviewed stressors/ coping techniques/symptoms/ support sources/ tx options and side effects in detail today  Better coping skills Enc her to keep working on exercise       Relevant Medications   busPIRone (BUSPAR) 30 MG tablet   PARoxetine (PAXIL) 40 MG tablet   Prediabetes    Lab Results  Component Value Date   HGBA1C 6.3 01/08/2019   This is stable -however fasting glucose was over 200 after eating much candy the day before  disc imp of low glycemic diet and wt loss to prevent DM2       Leukocytosis    Improved today with wbc of 11.2  Still monocyte predominant No clinical symptoms  Will continue to monitor        Tachycardia    Still well controlled with metoprolol xl        Electronic cigarette use    Enc pt to continue to cut down vaping with goal to quit eventually      Obesity (BMI 30-39.9)    Discussed how this problem influences overall health and the risks it imposes  Reviewed plan for weight loss with lower calorie diet (via better  food choices and also portion control or program like weight watchers) and exercise building up to or more than 30 minutes 5 days per week including some aerobic activity         Medicare annual wellness visit, subsequent - Primary    Reviewed health habits including diet and exercise and skin cancer prevention Reviewed appropriate screening tests for age  Also reviewed health mt list, fam hx and immunization status , as well as social and family history   Labs reviewed  Flu vaccine given  Pt declines dexa (no falls or fx)  Disc need for ca and D replacement  Enc exercise  Advance directive is utd  No cognitive concerns - did disc some night time dreams/ ? Hallucinations-will watch this (she does take ambien) Wt loss enc Hearing screen nl  Vision screen nl         Other Visit Diagnoses    Need for influenza vaccination       Relevant Orders   Flu Vaccine QUAD High Dose(Fluad) (Completed)

## 2019-01-16 NOTE — Assessment & Plan Note (Signed)
Improved today with wbc of 11.2  Still monocyte predominant No clinical symptoms  Will continue to monitor

## 2019-01-16 NOTE — Assessment & Plan Note (Signed)
Enc pt to continue to cut down vaping with goal to quit eventually

## 2019-02-26 ENCOUNTER — Other Ambulatory Visit: Payer: Self-pay

## 2019-02-26 ENCOUNTER — Ambulatory Visit
Admission: RE | Admit: 2019-02-26 | Discharge: 2019-02-26 | Disposition: A | Discharge status: Home / Self Care | Payer: Medicare Other | Source: Ambulatory Visit | Attending: Pulmonary Disease | Admitting: Pulmonary Disease

## 2019-02-26 DIAGNOSIS — R918 Other nonspecific abnormal finding of lung field: Secondary | ICD-10-CM | POA: Insufficient documentation

## 2019-02-27 ENCOUNTER — Ambulatory Visit: Payer: Medicare Other | Admitting: Pulmonary Disease

## 2019-02-27 DIAGNOSIS — R0600 Dyspnea, unspecified: Secondary | ICD-10-CM

## 2019-02-27 NOTE — Progress Notes (Signed)
    Assessment & Plan:  There are no diagnoses linked to this encounter.  There are no Patient Instructions on file for this visit.  Please note: late entry documentation due to logistical difficulties during COVID-19 pandemic. This note is filed for information purposes only, and is not intended to be used for billing, nor does it represent the full scope/nature of the visit in question. Please see any associated scanned media linked to date of encounter for additional pertinent information.  Subjective:    HPI: Anita Harrell is a 68 y.o. female presenting to the pulmonology clinic on 02/27/2019 with report of: No chief complaint on file.     No facility-administered encounter medications on file as of 02/27/2019.   Outpatient Encounter Medications as of 02/27/2019  Medication Sig   [DISCONTINUED] albuterol  (PROAIR  HFA) 108 (90 Base) MCG/ACT inhaler Inhale 2 puffs into the lungs every 6 (six) hours as needed (cough).   [DISCONTINUED] aspirin 81 MG tablet Take 81 mg by mouth daily.   [DISCONTINUED] busPIRone  (BUSPAR ) 30 MG tablet Take 0.5-1 tablets (15-30 mg total) by mouth 3 (three) times daily.   [DISCONTINUED] lisinopril -hydrochlorothiazide  (ZESTORETIC ) 10-12.5 MG tablet Take 0.5 tablets by mouth daily.   [DISCONTINUED] metoprolol  succinate (TOPROL -XL) 50 MG 24 hr tablet Take 1 tablet (50 mg total) by mouth daily. Take with or immediately following a meal.   [DISCONTINUED] PARoxetine  (PAXIL ) 40 MG tablet TAKE 1 AND 1/2 TABLETS BY MOUTH ONCE DAILY   [DISCONTINUED] rosuvastatin  (CRESTOR ) 10 MG tablet TAKE 1 TABLET BY MOUTH ONCE DAILY   [DISCONTINUED] zolpidem  (AMBIEN ) 10 MG tablet TAKE 1 TABLET BY MOUTH EVERY NIGHT AT BEDTIME AS NEEDED      Objective:   There were no vitals filed for this visit.   Physical exam documentation is limited by delayed entry of information.

## 2019-03-03 ENCOUNTER — Other Ambulatory Visit: Payer: Self-pay | Admitting: Family Medicine

## 2019-03-03 NOTE — Telephone Encounter (Signed)
Pt left v/m requesting cb about refill of inhaler. Pt has already seen lung dr and had CT.

## 2019-03-03 NOTE — Telephone Encounter (Signed)
??   If we would fill this or pulmonary she saw on 02/27/19

## 2019-05-07 ENCOUNTER — Other Ambulatory Visit: Payer: Self-pay | Admitting: Family Medicine

## 2019-05-07 NOTE — Telephone Encounter (Signed)
Name of Medication: Ambien Name of Pharmacy: [facility] or Written Date and Quantity: 01/07/19 #30 tabs with 3 refills Last Office Visit and Type: AWV on 01/16/19 Next Office Visit and Type: none scheduled

## 2019-09-09 ENCOUNTER — Other Ambulatory Visit: Payer: Self-pay | Admitting: Family Medicine

## 2019-09-10 NOTE — Telephone Encounter (Signed)
Name of Medication: Ambien Name of Pharmacy: [facility] or Written Date and Quantity: 05/07/2019 #30 tabs with 3 refills Last Office Visit and Type: AWV on 01/16/19 Next Office Visit and Type: none scheduled

## 2019-10-14 ENCOUNTER — Telehealth: Payer: Self-pay

## 2019-10-14 NOTE — Telephone Encounter (Signed)
[facility] Night - Client Nonclinical Telephone Record AccessNurse Client [facility] Night - Client Client Site [place] Physician Loura Pardon - MD Contact Type Call Who Is Calling Patient / Member / Family / Caregiver Caller Name [place] Phone Number [phone] Patient Name Anita Harrell Patient DOB Jun 05, 1950 Call Type Message Only Information Provided Reason for Call Request to Schedule Office Appointment Initial Comment Caller states that she is wanting to make an appt, some of her prescriptions have run out Disp. Time Disposition Final User 10/13/2019 5:56:39 PM General Information Provided Yes Juline Patch Call Closed By: Juline Patch Transaction Date/Time: 10/13/2019 5:54:43 PM (ET)

## 2019-10-17 ENCOUNTER — Encounter: Payer: Self-pay | Admitting: Family Medicine

## 2019-10-17 ENCOUNTER — Ambulatory Visit (INDEPENDENT_AMBULATORY_CARE_PROVIDER_SITE_OTHER): Payer: Medicare Other | Admitting: Family Medicine

## 2019-10-17 ENCOUNTER — Other Ambulatory Visit: Payer: Self-pay

## 2019-10-17 VITALS — BP 106/60 | HR 93 | Temp 97.1°F | Ht 63.5 in | Wt 202.5 lb

## 2019-10-17 DIAGNOSIS — I10 Essential (primary) hypertension: Secondary | ICD-10-CM

## 2019-10-17 DIAGNOSIS — N3281 Overactive bladder: Secondary | ICD-10-CM

## 2019-10-17 DIAGNOSIS — F5104 Psychophysiologic insomnia: Secondary | ICD-10-CM | POA: Diagnosis not present

## 2019-10-17 DIAGNOSIS — F418 Other specified anxiety disorders: Secondary | ICD-10-CM | POA: Diagnosis not present

## 2019-10-17 DIAGNOSIS — F419 Anxiety disorder, unspecified: Secondary | ICD-10-CM | POA: Diagnosis not present

## 2019-10-17 DIAGNOSIS — E669 Obesity, unspecified: Secondary | ICD-10-CM | POA: Diagnosis not present

## 2019-10-17 MED ORDER — ZOLPIDEM TARTRATE ER 12.5 MG PO TBCR: 12.5000 mg | EXTENDED_RELEASE_TABLET | Freq: Every evening | ORAL | 1 refills | Status: DC | PRN

## 2019-10-17 MED ORDER — SOLIFENACIN SUCCINATE 10 MG PO TABS: 10.0000 mg | ORAL_TABLET | Freq: Every day | ORAL | 3 refills | Status: DC | PRN

## 2019-10-17 NOTE — Patient Instructions (Addendum)
If you are interested in the new shingles vaccine (Shingrix) - call your local pharmacy to check on coverage and availability  If affordable, get on a wait list at your pharmacy to get the vaccine.  Please consider getting the covid vaccine   Take care of yourself  I sent in your px

## 2019-10-17 NOTE — Progress Notes (Signed)
Subjective:    Patient ID: Anita Harrell, female    DOB: March 06, 1951, 69 y.o.   MRN: [id number]  This visit occurred during the SARS-CoV-2 public health emergency.  Safety protocols were in place, including screening questions prior to the visit, additional usage of staff PPE, and extensive cleaning of exam room while observing appropriate contact time as indicated for disinfecting solutions.    HPI  Pt presents for f/u of chronic medical problems   Wt Readings from Last 3 Encounters:  10/17/19 202 lb 8 oz (91.9 kg)  01/16/19 209 lb 9 oz (95.1 kg)  06/25/18 201 lb 2 oz (91.2 kg)  lost some weight  She is working on it -wants to loose more  35.31 kg/m  Has not been immunized for covid Very scared of it Thinks she will get it    HTN bp is stable today  No cp or palpitations or headaches or edema  No side effects to medicines  BP Readings from Last 3 Encounters:  10/17/19 106/60  01/16/19 116/72  06/25/18 (!) 106/58     Depression /anxiety  Taking paxil 60 mg daily  buspar  Doing about the same - good and bad days at times but able to keep going  Good coping skills as well   ambien for sleep  She does much much better with ambien cr and wants to change back   Has overactive bladder  Saw urology and was worked up  [employer] works well- just takes prn  Plans to get a flu shot in the fall  Continues to avoid smoking  Patient Active Problem List   Diagnosis Date Noted  . Overactive bladder 10/17/2019  . Medicare annual wellness visit, subsequent 01/16/2019  . Thyroid nodule 05/01/2018  . Abnormal findings on diagnostic imaging of lung 05/01/2018  . Electronic cigarette use 04/03/2018  . Obesity (BMI 30-39.9) 04/03/2018  . Lung mass 02/01/2018  . Abnormal chest x-ray 01/29/2018  . Tachycardia 11/01/2015  . Microscopic hematuria 08/13/2012  . Routine general medical examination at a health care facility 07/22/2012  . Leukocytosis 03/30/2010  . Prediabetes  02/21/2008  . HYPERTENSION, BENIGN ESSENTIAL 01/22/2008  . ALLERGIC RHINITIS 07/25/2007  . BACK PAIN 06/26/2007  . Personal history of goiter 07/27/2006  . Hyperlipidemia 07/27/2006  . PANIC DISORDER 07/27/2006  . Former smoker 07/27/2006  . Depression with anxiety 07/27/2006  . Insomnia 07/27/2006  . Personal history of colonic polyps 06/09/2004   Past Medical History:  Diagnosis Date  . Allergy    allergic rhinitis  . Anxiety   . Back pain   . Chest pain   . Depression   . Epigastric pain   . Goiter   . History of tobacco abuse   . Hyperglycemia    mild  . Hyperlipidemia   . Hypertension   . Insomnia    Hx of  . Labile blood pressure   . Panic disorder    History of  . Personal history of colonic polyps 06/09/2004  . SVD (spontaneous vaginal delivery)    x 2   Past Surgical History:  Procedure Laterality Date  . ABDOMINAL HYSTERECTOMY N/A 09/24/2012   Procedure: HYSTERECTOMY ABDOMINAL;  Surgeon: Gus Height, MD;  Location: [facility];  Service: Gynecology;  Laterality: N/A;  . BREAST BIOPSY    . COLONOSCOPY    . COLONOSCOPY  2017  . ELBOW SURGERY  2004  . EYE SURGERY     bilateral lasik  . SALPINGOOPHORECTOMY Bilateral 09/24/2012  Procedure: SALPINGO OOPHORECTOMY;  Surgeon: Gus Height, MD;  Location: [facility];  Service: Gynecology;  Laterality: Bilateral;  . THYROID SURGERY     B9 massess  . WART FULGURATION N/A 09/24/2012   Procedure: FULGURATION VAGINAL WART;  Surgeon: Gus Height, MD;  Location: [facility];  Service: Gynecology;  Laterality: N/A;  . WISDOM TOOTH EXTRACTION     Social History   Tobacco Use  . Smoking status: Former Smoker    Packs/day: 1.00    Years: 20.00    Pack years: 20.00    Types: Cigarettes    Quit date: 12/11/2008    Years since quitting: 10.8  . Smokeless tobacco: Never Used  Vaping Use  . Vaping Use: Never used  Substance Use Topics  . Alcohol use: No    Alcohol/week: 0.0 standard drinks  . Drug use: No   Family History  Problem  Relation Age of Onset  . Hypertension Mother   . Stroke Mother   . Alcohol abuse Father   . Cancer Father        lung CA  . Heart disease Father        CHF  . Cancer Sister        breast CA  . Heart disease Sister        CHF from chemotx also has defib  . Breast cancer Sister   . Colon cancer Neg Hx   . Esophageal cancer Neg Hx   . Rectal cancer Neg Hx   . Stomach cancer Neg Hx    Allergies  Allergen Reactions  . Codeine Nausea And Vomiting    REACTION: Nausea and vomiting Pt has tolerated vicodin & percocet in the past  . Lipitor [Atorvastatin]     Muscle pain   . Wellbutrin [Bupropion]    Current Outpatient Medications on File Prior to Visit  Medication Sig Dispense Refill  . albuterol (VENTOLIN HFA) 108 (90 Base) MCG/ACT inhaler INHALE 2 PUFFS INTO THE LUNGS EVERY 6 HOURS AS NEEDED FOR COUGH. 18 g 3  . aspirin 81 MG tablet Take 81 mg by mouth daily.    . busPIRone (BUSPAR) 30 MG tablet Take 0.5-1 tablets (15-30 mg total) by mouth 3 (three) times daily. 270 tablet 3  . lisinopril-hydrochlorothiazide (ZESTORETIC) 10-12.5 MG tablet Take 0.5 tablets by mouth daily. 45 tablet 3  . metoprolol succinate (TOPROL-XL) 50 MG 24 hr tablet Take 1 tablet (50 mg total) by mouth daily. Take with or immediately following a meal. 90 tablet 3  . PARoxetine (PAXIL) 40 MG tablet TAKE 1 AND 1/2 TABLETS BY MOUTH ONCE DAILY 135 tablet 3  . rosuvastatin (CRESTOR) 10 MG tablet TAKE 1 TABLET BY MOUTH ONCE DAILY 90 tablet 3   No current facility-administered medications on file prior to visit.     Review of Systems  Constitutional: Negative for activity change, appetite change, fatigue, fever and unexpected weight change.  HENT: Negative for congestion, ear pain, rhinorrhea, sinus pressure and sore throat.   Eyes: Negative for pain, redness and visual disturbance.  Respiratory: Negative for cough, shortness of breath and wheezing.   Cardiovascular: Negative for chest pain and palpitations.    Gastrointestinal: Negative for abdominal pain, blood in stool, constipation and diarrhea.  Endocrine: Negative for polydipsia and polyuria.  Genitourinary: Positive for frequency and urgency. Negative for dysuria.       Overactive bladder with urge incontinence  Musculoskeletal: Negative for arthralgias, back pain and myalgias.  Skin: Negative for pallor and rash.  Allergic/Immunologic:  Negative for environmental allergies.  Neurological: Negative for dizziness, syncope and headaches.  Hematological: Negative for adenopathy. Does not bruise/bleed easily.  Psychiatric/Behavioral: Positive for sleep disturbance. Negative for decreased concentration, dysphoric mood and suicidal ideas. The patient is nervous/anxious.        Objective:   Physical Exam Constitutional:      General: She is not in acute distress.    Appearance: Normal appearance. She is well-developed. She is obese. She is not ill-appearing.  HENT:     Head: Normocephalic and atraumatic.  Eyes:     Conjunctiva/sclera: Conjunctivae normal.     Pupils: Pupils are equal, round, and reactive to light.  Neck:     Thyroid: No thyromegaly.     Vascular: No carotid bruit or JVD.  Cardiovascular:     Rate and Rhythm: Normal rate and regular rhythm.     Heart sounds: Normal heart sounds. No gallop.   Pulmonary:     Effort: Pulmonary effort is normal. No respiratory distress.     Breath sounds: Normal breath sounds. No wheezing or rales.  Abdominal:     General: Bowel sounds are normal. There is no distension or abdominal bruit.     Palpations: Abdomen is soft. There is no mass.     Tenderness: There is no abdominal tenderness. There is no left CVA tenderness.     Comments: No suprapubic tenderness or fullness    Musculoskeletal:     Cervical back: Normal range of motion and neck supple.  Lymphadenopathy:     Cervical: No cervical adenopathy.  Skin:    General: Skin is warm and dry.     Findings: No rash.  Neurological:      Mental Status: She is alert.     Deep Tendon Reflexes: Reflexes are normal and symmetric.  Psychiatric:        Mood and Affect: Mood is anxious.        Cognition and Memory: Cognition and memory normal.     Comments: Mildly anxious (improved from past) pleasant Candidly discusses stressors and symptoms             Assessment & Plan:   Problem List Items Addressed This Visit      Cardiovascular and Mediastinum   HYPERTENSION, BENIGN ESSENTIAL    bp in fair control at this time  BP Readings from Last 1 Encounters:  10/17/19 106/60   No changes needed Most recent labs reviewed  Disc lifstyle change with low sodium diet and exercise          Genitourinary   Overactive bladder    vesicare has worked well in the past  Refilled  Disc poss side effects         Other   Depression with anxiety - Primary    Still taking 60 mg of paxil daily along with buspar  Overall stable with good and bad days  Good coping skills Reviewed stressors/ coping techniques/symptoms/ support sources/ tx options and side effects in detail today  Uses ambien for sleep-the cr works much better for her      Insomnia    Takes ambien  Related to mood/anxiety-also taking paxil and buspar  She prefers [place] cr-will change back to that 12.5 mg each night  Disc poss side eff incl falls -using caution , and also habit/rebound insomnia (she voices understanding)       Obesity (BMI 30-39.9)    Discussed how this problem influences overall health and the risks it imposes  Reviewed plan for weight loss with lower calorie diet (via better food choices and also portion control or program like weight watchers) and exercise building up to or more than 30 minutes 5 days per week including some aerobic activity         RESOLVED: Anxiety disorder    Still taking 60 mg of paxil daily along with buspar  Overall stable with good and bad days  Good coping skills Reviewed stressors/ coping  techniques/symptoms/ support sources/ tx options and side effects in detail today  Uses ambien for sleep-the cr works much better for her

## 2019-10-19 NOTE — Assessment & Plan Note (Signed)
vesicare has worked well in the past  Refilled  Disc poss side effects

## 2019-10-19 NOTE — Assessment & Plan Note (Signed)
Discussed how this problem influences overall health and the risks it imposes  Reviewed plan for weight loss with lower calorie diet (via better food choices and also portion control or program like weight watchers) and exercise building up to or more than 30 minutes 5 days per week including some aerobic activity    

## 2019-10-19 NOTE — Assessment & Plan Note (Signed)
Takes ambien  Related to mood/anxiety-also taking paxil and buspar  She prefers [place] cr-will change back to that 12.5 mg each night  Disc poss side eff incl falls -using caution , and also habit/rebound insomnia (she voices understanding)

## 2019-10-19 NOTE — Assessment & Plan Note (Signed)
Still taking 60 mg of paxil daily along with buspar  Overall stable with good and bad days  Good coping skills Reviewed stressors/ coping techniques/symptoms/ support sources/ tx options and side effects in detail today  Uses ambien for sleep-the cr works much better for her

## 2019-10-19 NOTE — Assessment & Plan Note (Signed)
bp in fair control at this time  BP Readings from Last 1 Encounters:  10/17/19 106/60   No changes needed Most recent labs reviewed  Disc lifstyle change with low sodium diet and exercise

## 2019-10-20 DIAGNOSIS — Z23 Encounter for immunization: Secondary | ICD-10-CM | POA: Diagnosis not present

## 2019-10-23 ENCOUNTER — Telehealth: Payer: Self-pay | Admitting: *Deleted

## 2019-10-23 NOTE — Telephone Encounter (Signed)
PA done at [email or web address] for Ambien, I will await a response

## 2019-10-23 NOTE — Telephone Encounter (Signed)
Aware, thanks!

## 2019-10-23 NOTE — Telephone Encounter (Signed)
PA was approved. Pharmacy notified and letter placed in Dr. Marliss Coots inbox to sign and send for scanning

## 2019-11-10 DIAGNOSIS — Z23 Encounter for immunization: Secondary | ICD-10-CM | POA: Diagnosis not present

## 2019-12-02 DIAGNOSIS — L821 Other seborrheic keratosis: Secondary | ICD-10-CM | POA: Diagnosis not present

## 2019-12-02 DIAGNOSIS — L570 Actinic keratosis: Secondary | ICD-10-CM | POA: Diagnosis not present

## 2019-12-02 DIAGNOSIS — D2271 Melanocytic nevi of right lower limb, including hip: Secondary | ICD-10-CM | POA: Diagnosis not present

## 2019-12-02 DIAGNOSIS — L718 Other rosacea: Secondary | ICD-10-CM | POA: Diagnosis not present

## 2019-12-02 DIAGNOSIS — D1801 Hemangioma of skin and subcutaneous tissue: Secondary | ICD-10-CM | POA: Diagnosis not present

## 2019-12-31 ENCOUNTER — Other Ambulatory Visit: Payer: Self-pay | Admitting: Family Medicine

## 2020-01-13 NOTE — Telephone Encounter (Signed)
Pt had appt on 10/17/19.

## 2020-01-29 ENCOUNTER — Other Ambulatory Visit: Payer: Self-pay

## 2020-01-29 ENCOUNTER — Emergency Department: Payer: Medicare Other

## 2020-01-29 ENCOUNTER — Emergency Department
Admission: EM | Admit: 2020-01-29 | Discharge: 2020-01-29 | Disposition: A | Discharge status: Home / Self Care | Payer: Medicare Other | Attending: Emergency Medicine | Admitting: Emergency Medicine

## 2020-01-29 DIAGNOSIS — Z79899 Other long term (current) drug therapy: Secondary | ICD-10-CM | POA: Insufficient documentation

## 2020-01-29 DIAGNOSIS — W208XXA Other cause of strike by thrown, projected or falling object, initial encounter: Secondary | ICD-10-CM | POA: Diagnosis not present

## 2020-01-29 DIAGNOSIS — S0990XA Unspecified injury of head, initial encounter: Secondary | ICD-10-CM | POA: Diagnosis present

## 2020-01-29 DIAGNOSIS — Y92009 Unspecified place in unspecified non-institutional (private) residence as the place of occurrence of the external cause: Secondary | ICD-10-CM | POA: Diagnosis not present

## 2020-01-29 DIAGNOSIS — Z7982 Long term (current) use of aspirin: Secondary | ICD-10-CM | POA: Insufficient documentation

## 2020-01-29 DIAGNOSIS — I10 Essential (primary) hypertension: Secondary | ICD-10-CM | POA: Insufficient documentation

## 2020-01-29 DIAGNOSIS — W19XXXA Unspecified fall, initial encounter: Secondary | ICD-10-CM

## 2020-01-29 DIAGNOSIS — S0003XA Contusion of scalp, initial encounter: Secondary | ICD-10-CM | POA: Diagnosis not present

## 2020-01-29 DIAGNOSIS — Z87891 Personal history of nicotine dependence: Secondary | ICD-10-CM | POA: Insufficient documentation

## 2020-01-29 NOTE — Discharge Instructions (Addendum)

## 2020-01-29 NOTE — ED Triage Notes (Signed)
Patient reports mechanical fall this evening. Patient struck head on gun safe. Large hematoma to posterior head. Patient AO X 4, but with delayed response to date. Patient's pupil's equal and reactive. Patient not taking anticoagulants.

## 2020-01-29 NOTE — ED Provider Notes (Signed)
[facility] Emergency Department Provider Note  ____________________________________________   First MD Initiated Contact with Patient 01/29/20 0530     (approximate)  I have reviewed the triage vital signs and the nursing notes.   HISTORY  Chief Complaint Head Injury    HPI Anita Harrell is a 69 y.o. female with medical history as listed below who presents for evaluation after a fall at home. She reports that she got up to go the bathroom during the night and tripped over an extension cord that she forgot was on the floor. This caused her to fall backwards and strike the back of her head on the metal gun safe in the bedroom. She did not lose consciousness. She reports no pain in her neck. She has had no numbness nor tingling or visual changes. She had a substantial amount of swelling in the back of her head and says that there is a mild stinging and burning pain at the site of the swelling but no other pain. She denies chest pain, shortness of breath, dizziness, nausea, vomiting, and abdominal pain. She did not land on her arms or her legs. Nothing in particular makes the symptoms better or worse and the onset was acute in the fall moderate in severity.         Past Medical History:  Diagnosis Date  . Allergy    allergic rhinitis  . Anxiety   . Back pain   . Chest pain   . Depression   . Epigastric pain   . Goiter   . History of tobacco abuse   . Hyperglycemia    mild  . Hyperlipidemia   . Hypertension   . Insomnia    Hx of  . Labile blood pressure   . Panic disorder    History of  . Personal history of colonic polyps 06/09/2004  . SVD (spontaneous vaginal delivery)    x 2    Patient Active Problem List   Diagnosis Date Noted  . Overactive bladder 10/17/2019  . Medicare annual wellness visit, subsequent 01/16/2019  . Thyroid nodule 05/01/2018  . Abnormal findings on diagnostic imaging of lung 05/01/2018  . Electronic cigarette use  04/03/2018  . Obesity (BMI 30-39.9) 04/03/2018  . Lung mass 02/01/2018  . Abnormal chest x-ray 01/29/2018  . Tachycardia 11/01/2015  . Microscopic hematuria 08/13/2012  . Routine general medical examination at a health care facility 07/22/2012  . Leukocytosis 03/30/2010  . Prediabetes 02/21/2008  . HYPERTENSION, BENIGN ESSENTIAL 01/22/2008  . ALLERGIC RHINITIS 07/25/2007  . BACK PAIN 06/26/2007  . Personal history of goiter 07/27/2006  . Hyperlipidemia 07/27/2006  . PANIC DISORDER 07/27/2006  . Former smoker 07/27/2006  . Depression with anxiety 07/27/2006  . Insomnia 07/27/2006  . Personal history of colonic polyps 06/09/2004    Past Surgical History:  Procedure Laterality Date  . ABDOMINAL HYSTERECTOMY N/A 09/24/2012   Procedure: HYSTERECTOMY ABDOMINAL;  Surgeon: Gus Height, MD;  Location: [facility];  Service: Gynecology;  Laterality: N/A;  . BREAST BIOPSY    . COLONOSCOPY    . COLONOSCOPY  2017  . ELBOW SURGERY  2004  . EYE SURGERY     bilateral lasik  . SALPINGOOPHORECTOMY Bilateral 09/24/2012   Procedure: SALPINGO OOPHORECTOMY;  Surgeon: Gus Height, MD;  Location: [facility];  Service: Gynecology;  Laterality: Bilateral;  . THYROID SURGERY     B9 massess  . WART FULGURATION N/A 09/24/2012   Procedure: FULGURATION VAGINAL WART;  Surgeon: Gus Height, MD;  Location: [facility];  Service: Gynecology;  Laterality: N/A;  . WISDOM TOOTH EXTRACTION      Prior to Admission medications   Medication Sig Start Date End Date Taking? Authorizing Provider  albuterol (VENTOLIN HFA) 108 (90 Base) MCG/ACT inhaler INHALE 2 PUFFS INTO THE LUNGS EVERY 6 HOURS AS NEEDED FOR COUGH. 03/03/19   Tower, Wynelle Fanny, MD  aspirin 81 MG tablet Take 81 mg by mouth daily.    [provider]  busPIRone (BUSPAR) 30 MG tablet Take 0.5-1 tablets (15-30 mg total) by mouth 3 (three) times daily. 01/16/19   Tower, Wynelle Fanny, MD  lisinopril-hydrochlorothiazide (ZESTORETIC) 10-12.5 MG tablet Take 0.5 tablets by mouth  daily. 01/16/19   Tower, Wynelle Fanny, MD  metoprolol succinate (TOPROL-XL) 50 MG 24 hr tablet Take 1 tablet (50 mg total) by mouth daily. Take with or immediately following a meal. 01/16/19   Tower, Wynelle Fanny, MD  PARoxetine (PAXIL) 40 MG tablet TAKE 1 AND 1/2 TABLETS BY MOUTH ONCE DAILY 01/16/19   Tower, Wynelle Fanny, MD  rosuvastatin (CRESTOR) 10 MG tablet TAKE 1 TABLET BY MOUTH ONCE DAILY 12/31/19   Tower, Wynelle Fanny, MD  solifenacin (VESICARE) 10 MG tablet Take 1 tablet (10 mg total) by mouth daily as needed. 10/17/19   Tower, Wynelle Fanny, MD  zolpidem (AMBIEN CR) 12.5 MG CR tablet Take 1 tablet (12.5 mg total) by mouth at bedtime as needed for sleep. 10/17/19   Tower, Wynelle Fanny, MD    Allergies Codeine, Lipitor [atorvastatin], and Wellbutrin [bupropion]  Family History  Problem Relation Age of Onset  . Hypertension Mother   . Stroke Mother   . Alcohol abuse Father   . Cancer Father        lung CA  . Heart disease Father        CHF  . Cancer Sister        breast CA  . Heart disease Sister        CHF from chemotx also has defib  . Breast cancer Sister   . Colon cancer Neg Hx   . Esophageal cancer Neg Hx   . Rectal cancer Neg Hx   . Stomach cancer Neg Hx     Social History Social History   Tobacco Use  . Smoking status: Former Smoker    Packs/day: 1.00    Years: 20.00    Pack years: 20.00    Types: Cigarettes    Quit date: 12/11/2008    Years since quitting: 11.1  . Smokeless tobacco: Never Used  Vaping Use  . Vaping Use: Never used  Substance Use Topics  . Alcohol use: No    Alcohol/week: 0.0 standard drinks  . Drug use: No    Review of Systems Constitutional: No fever/chills Eyes: No visual changes. ENT: No sore throat. Cardiovascular: Denies chest pain. Respiratory: Denies shortness of breath. Gastrointestinal: No abdominal pain.  No nausea, no vomiting.  No diarrhea.  No constipation. Genitourinary: Negative for dysuria. Musculoskeletal: Swelling and pain in the back of the head.  Negative for neck pain.  Negative for back pain. Integumentary: Negative for rash. Neurological: Negative for headaches, focal weakness or numbness.   ____________________________________________   PHYSICAL EXAM:  VITAL SIGNS: ED Triage Vitals  Enc Vitals Group     BP 01/29/20 0346 131/68     Pulse Rate 01/29/20 0346 81     Resp 01/29/20 0346 17     Temp 01/29/20 0346 (!) 96.8 F (36 C)  Temp Source 01/29/20 0346 Axillary     SpO2 01/29/20 0346 96 %     Weight 01/29/20 0348 91.9 kg (202 lb 9.6 oz)     Height 01/29/20 0348 1.613 m (5' 3.5")     Head Circumference --      Peak Flow --      Pain Score 01/29/20 0348 5     Pain Loc --      Pain Edu? --      Excl. in [place]? --     Constitutional: Alert and oriented.  Eyes: Conjunctivae are normal.  Head: Large tender hematoma on the occiput but without any abrasion nor laceration. Nose: No congestion/rhinnorhea. Mouth/Throat: Patient is wearing a mask. Neck: No stridor.  No meningeal signs.   Cardiovascular: Normal rate, regular rhythm. Good peripheral circulation. Grossly normal heart sounds. Respiratory: Normal respiratory effort.  No retractions. Gastrointestinal: Soft and nontender. No distention.  Musculoskeletal: No cervical spine tenderness to palpation and no pain/tenderness with flexion, extension, nor rotation side to side of her head and neck. No lower extremity tenderness nor edema. No gross deformities of extremities. Neurologic:  Normal speech and language. No gross focal neurologic deficits are appreciated.  Skin:  Skin is warm, dry and intact. Psychiatric: Mood and affect are normal. Speech and behavior are normal.  ____________________________________________   LABS (all labs ordered are listed, but only abnormal results are displayed)  Labs Reviewed - No data to display ____________________________________________  EKG  No indication for emergent  EKG ____________________________________________  RADIOLOGY I, Hinda Kehr, personally viewed and evaluated these images (plain radiographs) as part of my medical decision making, as well as reviewing the written report by the radiologist.  ED MD interpretation: No evidence of intracranial injury, substantial scalp hematoma without fracture.  Official radiology report(s): CT Head Wo Contrast  Result Date: 01/29/2020 CLINICAL DATA:  Mechanical fall with head trauma EXAM: CT HEAD WITHOUT CONTRAST TECHNIQUE: Contiguous axial images were obtained from the base of the skull through the vertex without intravenous contrast. COMPARISON:  None. FINDINGS: Brain: No evidence of acute infarction, hemorrhage, hydrocephalus, extra-axial collection or mass lesion/mass effect. Vascular: No hyperdense vessel or unexpected calcification. Skull: Hematoma at the vertex.  No acute fracture. Sinuses/Orbits: No evidence of intra IMPRESSION: 1. No evidence of intracranial injury. 2. Scalp hematoma without fracture. Electronically Signed   By: Monte Fantasia M.D.   On: 01/29/2020 04:24    ____________________________________________   PROCEDURES   Procedure(s) performed (including Critical Care):  Procedures   ____________________________________________   INITIAL IMPRESSION / MDM / [place] / ED COURSE  As part of my medical decision making, I reviewed the following data within the [facility] notes reviewed and incorporated and Notes from prior ED visits   Differential diagnosis includes, but is not limited to, hematoma, mechanical fall, scalp laceration or abrasion, skull fracture, intracranial bleed.  Patient is alert and oriented and has been stable for 2 hours in the emergency department. The only notable physical exam finding is a large hematoma. No indication for cervical spine imaging based on Nexus criteria. CT scan reviewed by me personally as well as the  radiologist reports and there is no evidence of intracranial injury or nor skull fracture. The patient is relieved and I had my usual and customary head contusion discussion with the patient. There is no clinical sign of concussion at this time either. I gave my usual and customary return precautions. The patient is not on any blood  thinner.         ____________________________________________  FINAL CLINICAL IMPRESSION(S) / ED DIAGNOSES  Final diagnoses:  Fall, initial encounter  Contusion of scalp, initial encounter  Scalp hematoma, initial encounter     MEDICATIONS GIVEN DURING THIS VISIT:  Medications - No data to display   ED Discharge Orders    None      *Please note:  Anita Harrell was evaluated in Emergency Department on 01/29/2020 for the symptoms described in the history of present illness. She was evaluated in the context of the global COVID-19 pandemic, which necessitated consideration that the patient might be at risk for infection with the SARS-CoV-2 virus that causes COVID-19. Institutional protocols and algorithms that pertain to the evaluation of patients at risk for COVID-19 are in a state of rapid change based on information released by regulatory bodies including the CDC and federal and state organizations. These policies and algorithms were followed during the patient's care in the ED.  Some ED evaluations and interventions may be delayed as a result of limited staffing during and after the pandemic.*  Note:  This document was prepared using Dragon voice recognition software and may include unintentional dictation errors.   Hinda Kehr, MD 01/29/20 [phone]

## 2020-03-29 ENCOUNTER — Other Ambulatory Visit: Payer: Self-pay | Admitting: Family Medicine

## 2020-03-31 NOTE — Telephone Encounter (Signed)
Please schedule late winter or early spring PE and refill until then

## 2020-03-31 NOTE — Telephone Encounter (Signed)
Med refilled once and Carrie will reach out to pt to try and get appt scheduled  

## 2020-03-31 NOTE — Telephone Encounter (Signed)
Last OV was a CPE on 01/16/19 and no recent or future appts., please advise

## 2020-04-01 ENCOUNTER — Other Ambulatory Visit: Payer: Self-pay | Admitting: Family Medicine

## 2020-04-01 NOTE — Telephone Encounter (Signed)
?   If to soon  Name of Medication: ambien cr Name of Pharmacy: [facility] or Written Date and Quantity: 10/17/19 #90 with 1 refill Last Office Visit and Type: med refill on 10/17/19 Next Office Visit and Type: none scheduled

## 2020-04-01 NOTE — Telephone Encounter (Signed)
Looks like that is too early

## 2020-04-08 ENCOUNTER — Other Ambulatory Visit: Payer: Self-pay | Admitting: Family Medicine

## 2020-04-09 NOTE — Telephone Encounter (Signed)
Pharmacy requests refill on: Zolpidem 12.5 mg   LAST REFILL: 10/17/2019 (Q-90, R-1) LAST OV: 10/17/2019 NEXT OV: Not Scheduled  PHARMACY: [facility]

## 2020-04-09 NOTE — Telephone Encounter (Signed)
I don't think this is due to fill until feb 2 (Wednesday) For some reason if she is out let me know

## 2020-04-09 NOTE — Telephone Encounter (Signed)
Checked state database and fill dates are  10/17/19 01/12/20 (early)  Rx declined because she should have enough until due

## 2020-04-10 ENCOUNTER — Other Ambulatory Visit: Payer: Self-pay | Admitting: Family Medicine

## 2020-04-12 NOTE — Telephone Encounter (Signed)
Name of Medication: Zolpidem Name of Pharmacy: [facility] or Written Date and Quantity: 10/17/19 #90 with 1 refill Last Office Visit and Type: 10/17/19 follow up chronic conditions Next Office Visit and Type: 05/25/20 CPE Last Controlled Substance Agreement Date: 07/13/2015 Last UDS: 07/13/2015

## 2020-05-13 ENCOUNTER — Telehealth: Payer: Self-pay | Admitting: Family Medicine

## 2020-05-13 DIAGNOSIS — I10 Essential (primary) hypertension: Secondary | ICD-10-CM

## 2020-05-13 DIAGNOSIS — R7303 Prediabetes: Secondary | ICD-10-CM

## 2020-05-13 DIAGNOSIS — E7800 Pure hypercholesterolemia, unspecified: Secondary | ICD-10-CM

## 2020-05-13 NOTE — Telephone Encounter (Signed)
-----   Message from Cloyd Stagers, RT sent at 04/26/2020  2:36 PM EST ----- Regarding: Lab Orders for Friday 3.4.2022 Please place lab orders for  Friday 3.4.2022, office visit for physical on Tuesday 3.15.2022 Thank you, Dyke Maes RT(R)

## 2020-05-14 ENCOUNTER — Ambulatory Visit (INDEPENDENT_AMBULATORY_CARE_PROVIDER_SITE_OTHER): Payer: Medicare Other

## 2020-05-14 ENCOUNTER — Other Ambulatory Visit (INDEPENDENT_AMBULATORY_CARE_PROVIDER_SITE_OTHER): Payer: Medicare Other

## 2020-05-14 ENCOUNTER — Other Ambulatory Visit: Payer: Self-pay

## 2020-05-14 DIAGNOSIS — Z0000 Encounter for general adult medical examination without abnormal findings: Secondary | ICD-10-CM

## 2020-05-14 DIAGNOSIS — R7303 Prediabetes: Secondary | ICD-10-CM

## 2020-05-14 DIAGNOSIS — I10 Essential (primary) hypertension: Secondary | ICD-10-CM | POA: Diagnosis not present

## 2020-05-14 DIAGNOSIS — E7800 Pure hypercholesterolemia, unspecified: Secondary | ICD-10-CM

## 2020-05-14 LAB — COMPREHENSIVE METABOLIC PANEL
ALT: 16 U/L (ref 0–35)
AST: 14 U/L (ref 0–37)
Albumin: 4.1 g/dL (ref 3.5–5.2)
Alkaline Phosphatase: 53 U/L (ref 39–117)
BUN: 12 mg/dL (ref 6–23)
CO2: 26 mEq/L (ref 19–32)
Calcium: 9.4 mg/dL (ref 8.4–10.5)
Chloride: 104 mEq/L (ref 96–112)
Creatinine, Ser: 0.65 mg/dL (ref 0.40–1.20)
GFR: 88.72 mL/min (ref >60.00)
Glucose, Bld: 125 mg/dL — ABNORMAL HIGH (ref 70–99)
Potassium: 4.2 mEq/L (ref 3.5–5.1)
Sodium: 139 mEq/L (ref 135–145)
Total Bilirubin: 0.5 mg/dL (ref 0.2–1.2)
Total Protein: 6.8 g/dL (ref 6.0–8.3)

## 2020-05-14 LAB — CBC WITH DIFFERENTIAL/PLATELET
Basophils Absolute: 0.1 10*3/uL (ref 0.0–0.1)
Basophils Relative: 0.8 % (ref 0.0–3.0)
Eosinophils Absolute: 0 10*3/uL (ref 0.0–0.7)
Eosinophils Relative: 0.5 % (ref 0.0–5.0)
HCT: 38.9 % (ref 36.0–46.0)
Hemoglobin: 13.4 g/dL (ref 12.0–15.0)
Lymphocytes Relative: 23.2 % (ref 12.0–46.0)
Lymphs Abs: 1.6 10*3/uL (ref 0.7–4.0)
MCHC: 34.3 g/dL (ref 30.0–36.0)
MCV: 86.9 fl (ref 78.0–100.0)
Monocytes Absolute: 1.5 10*3/uL — ABNORMAL HIGH (ref 0.1–1.0)
Monocytes Relative: 22.3 % — ABNORMAL HIGH (ref 3.0–12.0)
Neutro Abs: 3.7 10*3/uL (ref 1.4–7.7)
Neutrophils Relative %: 53.2 % (ref 43.0–77.0)
Platelets: 166 10*3/uL (ref 150.0–400.0)
RBC: 4.48 Mil/uL (ref 3.87–5.11)
RDW: 12.9 % (ref 11.5–15.5)
WBC: 6.9 10*3/uL (ref 4.0–10.5)

## 2020-05-14 LAB — LIPID PANEL
Cholesterol: 108 mg/dL (ref 0–200)
HDL: 40.6 mg/dL (ref >39.00)
LDL Cholesterol: 39 mg/dL (ref 0–99)
NonHDL: 67.24
Total CHOL/HDL Ratio: 3
Triglycerides: 143 mg/dL (ref 0.0–149.0)
VLDL: 28.6 mg/dL (ref 0.0–40.0)

## 2020-05-14 LAB — TSH: TSH: 1.36 u[IU]/mL (ref 0.35–4.50)

## 2020-05-14 LAB — HEMOGLOBIN A1C: Hgb A1c MFr Bld: 6.1 % (ref 4.6–6.5)

## 2020-05-14 NOTE — Progress Notes (Signed)
Subjective:   Anita Harrell is a 70 y.o. female who presents for Medicare Annual (Subsequent) preventive examination.  Review of Systems: N/A      I connected with the patient today by telephone and verified that I am speaking with the correct person using two identifiers. Location patient: home Location nurse: work Persons participating in the telephone visit: patient, nurse.   I discussed the limitations, risks, security and privacy concerns of performing an evaluation and management service by telephone and the availability of in person appointments. I also discussed with the patient that there may be a patient responsible charge related to this service. The patient expressed understanding and verbally consented to this telephonic visit.        Cardiac Risk Factors include: advanced age (>32men, >65 women);hypertension;Other (see comment), Risk factor comments: hyperlipidemia     Objective:    Today's Vitals   There is no height or weight on file to calculate BMI.  Advanced Directives 05/14/2020 01/29/2020 12/15/2016 11/12/2015 08/30/2015 09/24/2012 09/17/2012  Does Patient Have a Medical Advance Directive? Yes No Yes Yes Yes Patient has advance directive, copy not in chart Patient has advance directive, copy not in chart  Type of Advance Directive [place];Living will - Living will Living will Living will - -  Does patient want to make changes to medical advance directive? - - - - No - Patient declined - -  Copy of [place] in Chart? No - copy requested - - - No - copy requested Copy requested from family -  Would patient like information on creating a medical advance directive? - No - Patient declined - - - - -  Pre-existing out of facility DNR order (yellow form or pink MOST form) - - - - - No -    Current Medications (verified) Outpatient Encounter Medications as of 05/14/2020  Medication Sig  . albuterol (VENTOLIN HFA) 108 (90 Base)  MCG/ACT inhaler INHALE 2 PUFFS INTO THE LUNGS EVERY 6 HOURS AS NEEDED FOR COUGH.  Marland Kitchen aspirin 81 MG tablet Take 81 mg by mouth daily.  . busPIRone (BUSPAR) 30 MG tablet Take 0.5-1 tablets (15-30 mg total) by mouth 3 (three) times daily.  Marland Kitchen lisinopril-hydrochlorothiazide (ZESTORETIC) 10-12.5 MG tablet TAKE 1/2 TABLET BY MOUTH ONCE DAILY  . metoprolol succinate (TOPROL-XL) 50 MG 24 hr tablet TAKE 1 TABLET BY MOUTH ONCE DAILY *TAKE WITH OR IMMEDIATELY FOLLOWING A MEAL*  . PARoxetine (PAXIL) 40 MG tablet TAKE 1 AND 1/2 TABLETS BY MOUTH ONCE DAILY  . rosuvastatin (CRESTOR) 10 MG tablet TAKE 1 TABLET BY MOUTH ONCE DAILY  . solifenacin (VESICARE) 10 MG tablet Take 1 tablet (10 mg total) by mouth daily as needed.  . zolpidem (AMBIEN CR) 12.5 MG CR tablet TAKE 1 TABLET BY MOUTH ONCE DAILY AT BEDTIME AS NEEDED FOR SLEEP   No facility-administered encounter medications on file as of 05/14/2020.    Allergies (verified) Codeine, Lipitor [atorvastatin], and Wellbutrin [bupropion]   History: Past Medical History:  Diagnosis Date  . Allergy    allergic rhinitis  . Anxiety   . Back pain   . Chest pain   . Depression   . Epigastric pain   . Goiter   . History of tobacco abuse   . Hyperglycemia    mild  . Hyperlipidemia   . Hypertension   . Insomnia    Hx of  . Labile blood pressure   . Panic disorder    History of  .  Personal history of colonic polyps 06/09/2004  . SVD (spontaneous vaginal delivery)    x 2   Past Surgical History:  Procedure Laterality Date  . ABDOMINAL HYSTERECTOMY N/A 09/24/2012   Procedure: HYSTERECTOMY ABDOMINAL;  Surgeon: Gus Height, MD;  Location: [facility];  Service: Gynecology;  Laterality: N/A;  . BREAST BIOPSY    . COLONOSCOPY    . COLONOSCOPY  2017  . ELBOW SURGERY  2004  . EYE SURGERY     bilateral lasik  . SALPINGOOPHORECTOMY Bilateral 09/24/2012   Procedure: SALPINGO OOPHORECTOMY;  Surgeon: Gus Height, MD;  Location: [facility];  Service: Gynecology;  Laterality:  Bilateral;  . THYROID SURGERY     B9 massess  . WART FULGURATION N/A 09/24/2012   Procedure: FULGURATION VAGINAL WART;  Surgeon: Gus Height, MD;  Location: [facility];  Service: Gynecology;  Laterality: N/A;  . WISDOM TOOTH EXTRACTION     Family History  Problem Relation Age of Onset  . Hypertension Mother   . Stroke Mother   . Alcohol abuse Father   . Cancer Father        lung CA  . Heart disease Father        CHF  . Cancer Sister        breast CA  . Heart disease Sister        CHF from chemotx also has defib  . Breast cancer Sister   . Colon cancer Neg Hx   . Esophageal cancer Neg Hx   . Rectal cancer Neg Hx   . Stomach cancer Neg Hx    Social History   Socioeconomic History  . Marital status: Married    Spouse name: Not on file  . Number of children: Not on file  . Years of education: Not on file  . Highest education level: Not on file  Occupational History  . Not on file  Tobacco Use  . Smoking status: Former Smoker    Packs/day: 1.00    Years: 20.00    Pack years: 20.00    Types: Cigarettes    Quit date: 12/11/2008    Years since quitting: 11.4  . Smokeless tobacco: Never Used  Vaping Use  . Vaping Use: Never used  Substance and Sexual Activity  . Alcohol use: No    Alcohol/week: 0.0 standard drinks  . Drug use: No  . Sexual activity: Yes    Birth control/protection: Post-menopausal  Other Topics Concern  . Not on file  Social History Narrative  . Not on file   Social Determinants of Health   Financial Resource Strain: Low Risk   . Difficulty of Paying Living Expenses: Not hard at all  Food Insecurity: No Food Insecurity  . Worried About Charity fundraiser in the Last Year: Never true  . Ran Out of Food in the Last Year: Never true  Transportation Needs: No Transportation Needs  . Lack of Transportation (Medical): No  . Lack of Transportation (Non-Medical): No  Physical Activity: Inactive  . Days of Exercise per Week: 0 days  . Minutes of Exercise  per Session: 0 min  Stress: No Stress Concern Present  . Feeling of Stress : Not at all  Social Connections: Not on file    Tobacco Counseling Counseling given: Not Answered   Clinical Intake:  Pre-visit preparation completed: Yes  Pain : No/denies pain     Nutritional Risks: None Diabetes: No  How often do you need to have someone help you when you read  instructions, pamphlets, or other written materials from your doctor or pharmacy?: 1 - Never What is the last grade level you completed in school?: 12th  Diabetic: No Nutrition Risk Assessment:  Has the patient had any N/V/D within the last 2 months?  No  Does the patient have any non-healing wounds?  No  Has the patient had any unintentional weight loss or weight gain?  No   Diabetes:  Is the patient diabetic?  No  If diabetic, was a CBG obtained today?  N/A Did the patient bring in their glucometer from home?  N/A How often do you monitor your CBG's? N/A.   Financial Strains and Diabetes Management:  Are you having any financial strains with the device, your supplies or your medication? N/A.  Does the patient want to be seen by Chronic Care Management for management of their diabetes?  N/A Would the patient like to be referred to a Nutritionist or for Diabetic Management?  N/A   Interpreter Needed?: No  Information entered by :: CJohnson, LPN   Activities of Daily Living In your present state of health, do you have any difficulty performing the following activities: 05/14/2020  Hearing? N  Vision? N  Difficulty concentrating or making decisions? Y  Comment does have short term memory loss  Walking or climbing stairs? N  Dressing or bathing? N  Doing errands, shopping? N  Preparing Food and eating ? N  Using the Toilet? N  In the past six months, have you accidently leaked urine? N  Do you have problems with loss of bowel control? N  Managing your Medications? N  Managing your Finances? N  Housekeeping  or managing your Housekeeping? N  Some recent data might be hidden    Patient Care Team: Tower, Wynelle Fanny, MD as PCP - General Vania Rea, MD as Consulting Physician (Obstetrics and Gynecology)  Indicate any recent Medical Services you may have received from other than [facility] providers in the past year (date may be approximate).     Assessment:   This is a routine wellness examination for Aianna.  Hearing/Vision screen  Hearing Screening   125Hz  250Hz  500Hz  1000Hz  2000Hz  3000Hz  4000Hz  6000Hz  8000Hz   Right ear:           Left ear:           Vision Screening Comments: Advised patient to get annual eye exams  Dietary issues and exercise activities discussed: Current Exercise Habits: The patient does not participate in regular exercise at present, Exercise limited by: None identified  Goals    . Increase physical activity     Starting 12/15/2016, I will continue to exercise for 5 min daily.     . Patient Stated     05/14/2020, I will maintain and continue medications as prescribed.      Depression Screen PHQ 2/9 Scores 05/14/2020 10/17/2019 01/16/2019 12/25/2017 12/15/2016 08/30/2015 06/09/2015  PHQ - 2 Score 4 2 0 0 2 0 6  PHQ- 9 Score 6 10 - - 3 - 13    Fall Risk Fall Risk  05/14/2020 01/16/2019 10/14/2018 02/15/2017 12/15/2016  Falls in the past year? 1 0 0 No No  Comment tripped and fell - Emmi Telephone Survey: data to providers prior to load Emmi Telephone Survey: data to providers prior to load -  Number falls in past yr: 0 - - - -  Injury with Fall? 0 - - - -  Risk for fall due to : Medication side effect - - - -  Follow up Falls evaluation completed;Falls prevention discussed - - - -    FALL RISK PREVENTION PERTAINING TO THE HOME:  Any stairs in or around the home? Yes  If so, are there any without handrails? No  Home free of loose throw rugs in walkways, pet beds, electrical cords, etc? Yes  Adequate lighting in your home to reduce risk of falls? Yes   ASSISTIVE DEVICES  UTILIZED TO PREVENT FALLS:  Life alert? No  Use of a cane, walker or w/c? No  Grab bars in the bathroom? No  Shower chair or bench in shower? No  Elevated toilet seat or a handicapped toilet? No   TIMED UP AND GO:  Was the test performed? N/A telephone visit.    Cognitive Function: MMSE - Mini Mental State Exam 05/14/2020 12/15/2016 08/30/2015  Orientation to time 5 5 5   Orientation to Place 5 5 5   Registration 3 3 3   Attention/ Calculation 5 0 0  Recall 3 3 3   Language- name 2 objects - 0 0  Language- repeat 1 1 1   Language- follow 3 step command - 3 3  Language- read & follow direction - 0 0  Write a sentence - 0 0  Copy design - 0 0  Total score - 20 20  Mini Cog  Mini-Cog screen was completed. Maximum score is 22. A value of 0 denotes this part of the MMSE was not completed or the patient failed this part of the Mini-Cog screening.       Immunizations Immunization History  Administered Date(s) Administered  . Fluad Quad(high Dose 65+) 01/16/2019  . Influenza Split 03/27/2011, 01/23/2012  . Influenza,inj,Quad PF,6+ Mos 11/26/2012, 11/10/2013, 11/24/2016, 12/25/2017  . PFIZER(Purple Top)SARS-COV-2 Vaccination 10/20/2019, 11/10/2019  . Pneumococcal Conjugate-13 08/21/2014  . Pneumococcal Polysaccharide-23 05/19/2008, 08/30/2015  . Td 03/13/2001  . Tdap 01/23/2012  . Zoster 01/07/2013    TDAP status: Up to date  Flu Vaccine status: Due, Education has been provided regarding the importance of this vaccine. Advised may receive this vaccine at local pharmacy or Health Dept. Aware to provide a copy of the vaccination record if obtained from local pharmacy or Health Dept. Verbalized acceptance and understanding.  Pneumococcal vaccine status: Up to date  Covid-19 vaccine status: 2 doses completed, booster declined  Qualifies for Shingles Vaccine? Yes   Zostavax completed Yes   Shingrix Completed?: No.    Education has been provided regarding the importance of this  vaccine. Patient has been advised to call insurance company to determine out of pocket expense if they have not yet received this vaccine. Advised may also receive vaccine at local pharmacy or Health Dept. Verbalized acceptance and understanding.  Screening Tests Health Maintenance  Topic Date Due  . MAMMOGRAM  05/29/2019  . INFLUENZA VACCINE  10/12/2019  . COVID-19 Vaccine (3 - Booster for Pfizer series) 05/30/2020 (Originally 05/10/2020)  . DEXA SCAN  12/15/2048 (Originally 03/04/2014)  . COLONOSCOPY (Pts 45-19yrs Insurance coverage will need to be confirmed)  11/11/2020  . TETANUS/TDAP  01/22/2022  . Hepatitis C Screening  Completed  . PNA vac Low Risk Adult  Completed  . HPV VACCINES  Aged Out    Health Maintenance  Health Maintenance Due  Topic Date Due  . MAMMOGRAM  05/29/2019  . INFLUENZA VACCINE  10/12/2019    Colorectal cancer screening: Type of screening: Colonoscopy. Completed 11/12/2015. Repeat every 5 years  Mammogram status: due, will discuss with provider at physical   Bone Density status: due, will discuss  with provider at physical   Lung Cancer Screening: (Low Dose CT Chest recommended if Age 47-80 years, 30 pack-year currently smoking OR have quit w/in 15 years.) does not qualify.   Additional Screening:  Hepatitis C Screening: does qualify; Completed 08/30/2015  Vision Screening: Recommended annual ophthalmology exams for early detection of glaucoma and other disorders of the eye. Is the patient up to date with their annual eye exam?  No  does not go to eye doctor yearly  Who is the provider or what is the name of the office in which the patient attends annual eye exams? does not go to eye doctor yearly  If pt is not established with a provider, would they like to be referred to a provider to establish care? No .   Dental Screening: Recommended annual dental exams for proper oral hygiene  Community Resource Referral / Chronic Care Management: CRR required  this visit?  No   CCM required this visit?  No      Plan:     I have personally reviewed and noted the following in the patient's chart:   . Medical and social history . Use of alcohol, tobacco or illicit drugs  . Current medications and supplements . Functional ability and status . Nutritional status . Physical activity . Advanced directives . List of other physicians . Hospitalizations, surgeries, and ER visits in previous 12 months . Vitals . Screenings to include cognitive, depression, and falls . Referrals and appointments  In addition, I have reviewed and discussed with patient certain preventive protocols, quality metrics, and best practice recommendations. A written personalized care plan for preventive services as well as general preventive health recommendations were provided to patient.   Due to this being a telephonic visit, the after visit summary with patients personalized plan was offered to patient via office or my-chart. Patient preferred to pick up at office at next visit or via mychart.    Andrez Grime, LPN   [id number]

## 2020-05-14 NOTE — Progress Notes (Signed)
PCP notes:  Health Maintenance: Flu- due Covid- booster declined Mammogram- due Dexa- due   Abnormal Screenings: none   Patient concerns: Discuss short term memory loss   Nurse concerns: none   Next PCP appt.: 05/25/2020 @ 2:30 pm

## 2020-05-14 NOTE — Patient Instructions (Signed)
Anita Harrell , Thank you for taking time to come for your Medicare Wellness Visit. I appreciate your ongoing commitment to your health goals. Please review the following plan we discussed and let me know if I can assist you in the future.   Screening recommendations/referrals: Colonoscopy: Up to date, completed 11/12/2015, due 11/2020 Mammogram: due, will discuss with provider at physical  Bone Density: due, will discuss with provider at physical  Recommended yearly ophthalmology/optometry visit for glaucoma screening and checkup Recommended yearly dental visit for hygiene and checkup  Vaccinations: Influenza vaccine: due, will discuss with provider at physical  Pneumococcal vaccine: Completed series Tdap vaccine: Up to date, completed 01/23/2012, due 01/2022 Shingles vaccine: due, check with your insurance regarding coverage if interested    Covid-19: 2 doses completed, booster declined   Advanced directives: Please bring a copy of your POA (Power of [place]) and/or Living Will to your next appointment.   Conditions/risks identified: hypertension, hyperlipidemia   Next appointment: Follow up in one year for your annual wellness visit    Preventive Care 56 Years and Older, Female Preventive care refers to lifestyle choices and visits with your health care provider that can promote health and wellness. What does preventive care include?  A yearly physical exam. This is also called an annual well check.  Dental exams once or twice a year.  Routine eye exams. Ask your health care provider how often you should have your eyes checked.  Personal lifestyle choices, including:  Daily care of your teeth and gums.  Regular physical activity.  Eating a healthy diet.  Avoiding tobacco and drug use.  Limiting alcohol use.  Practicing safe sex.  Taking low-dose aspirin every day.  Taking vitamin and mineral supplements as recommended by your health care provider. What happens during  an annual well check? The services and screenings done by your health care provider during your annual well check will depend on your age, overall health, lifestyle risk factors, and family history of disease. Counseling  Your health care provider may ask you questions about your:  Alcohol use.  Tobacco use.  Drug use.  Emotional well-being.  Home and relationship well-being.  Sexual activity.  Eating habits.  History of falls.  Memory and ability to understand (cognition).  Work and work Statistician.  Reproductive health. Screening  You may have the following tests or measurements:  Height, weight, and BMI.  Blood pressure.  Lipid and cholesterol levels. These may be checked every 5 years, or more frequently if you are over 38 years old.  Skin check.  Lung cancer screening. You may have this screening every year starting at age 35 if you have a 30-pack-year history of smoking and currently smoke or have quit within the past 15 years.  Fecal occult blood test (FOBT) of the stool. You may have this test every year starting at age 60.  Flexible sigmoidoscopy or colonoscopy. You may have a sigmoidoscopy every 5 years or a colonoscopy every 10 years starting at age 66.  Hepatitis C blood test.  Hepatitis B blood test.  Sexually transmitted disease (STD) testing.  Diabetes screening. This is done by checking your blood sugar (glucose) after you have not eaten for a while (fasting). You may have this done every 1-3 years.  Bone density scan. This is done to screen for osteoporosis. You may have this done starting at age 37.  Mammogram. This may be done every 1-2 years. Talk to your health care provider about how often you  should have regular mammograms. Talk with your health care provider about your test results, treatment options, and if necessary, the need for more tests. Vaccines  Your health care provider may recommend certain vaccines, such as:  Influenza  vaccine. This is recommended every year.  Tetanus, diphtheria, and acellular pertussis (Tdap, Td) vaccine. You may need a Td booster every 10 years.  Zoster vaccine. You may need this after age 13.  Pneumococcal 13-valent conjugate (PCV13) vaccine. One dose is recommended after age 72.  Pneumococcal polysaccharide (PPSV23) vaccine. One dose is recommended after age 47. Talk to your health care provider about which screenings and vaccines you need and how often you need them. This information is not intended to replace advice given to you by your health care provider. Make sure you discuss any questions you have with your health care provider. Document Released: 03/26/2015 Document Revised: 11/17/2015 Document Reviewed: 12/29/2014 Elsevier Interactive Patient Education  2017 [place] Prevention in the Home Falls can cause injuries. They can happen to people of all ages. There are many things you can do to make your home safe and to help prevent falls. What can I do on the outside of my home?  Regularly fix the edges of walkways and driveways and fix any cracks.  Remove anything that might make you trip as you walk through a door, such as a raised step or threshold.  Trim any bushes or trees on the path to your home.  Use bright outdoor lighting.  Clear any walking paths of anything that might make someone trip, such as rocks or tools.  Regularly check to see if handrails are loose or broken. Make sure that both sides of any steps have handrails.  Any raised decks and porches should have guardrails on the edges.  Have any leaves, snow, or ice cleared regularly.  Use sand or salt on walking paths during winter.  Clean up any spills in your garage right away. This includes oil or grease spills. What can I do in the bathroom?  Use night lights.  Install grab bars by the toilet and in the tub and shower. Do not use towel bars as grab bars.  Use non-skid mats or decals  in the tub or shower.  If you need to sit down in the shower, use a plastic, non-slip stool.  Keep the floor dry. Clean up any water that spills on the floor as soon as it happens.  Remove soap buildup in the tub or shower regularly.  Attach bath mats securely with double-sided non-slip rug tape.  Do not have throw rugs and other things on the floor that can make you trip. What can I do in the bedroom?  Use night lights.  Make sure that you have a light by your bed that is easy to reach.  Do not use any sheets or blankets that are too big for your bed. They should not hang down onto the floor.  Have a firm chair that has side arms. You can use this for support while you get dressed.  Do not have throw rugs and other things on the floor that can make you trip. What can I do in the kitchen?  Clean up any spills right away.  Avoid walking on wet floors.  Keep items that you use a lot in easy-to-reach places.  If you need to reach something above you, use a strong step stool that has a grab bar.  Keep electrical cords out  of the way.  Do not use floor polish or wax that makes floors slippery. If you must use wax, use non-skid floor wax.  Do not have throw rugs and other things on the floor that can make you trip. What can I do with my stairs?  Do not leave any items on the stairs.  Make sure that there are handrails on both sides of the stairs and use them. Fix handrails that are broken or loose. Make sure that handrails are as long as the stairways.  Check any carpeting to make sure that it is firmly attached to the stairs. Fix any carpet that is loose or worn.  Avoid having throw rugs at the top or bottom of the stairs. If you do have throw rugs, attach them to the floor with carpet tape.  Make sure that you have a light switch at the top of the stairs and the bottom of the stairs. If you do not have them, ask someone to add them for you. What else can I do to help  prevent falls?  Wear shoes that:  Do not have high heels.  Have rubber bottoms.  Are comfortable and fit you well.  Are closed at the toe. Do not wear sandals.  If you use a stepladder:  Make sure that it is fully opened. Do not climb a closed stepladder.  Make sure that both sides of the stepladder are locked into place.  Ask someone to hold it for you, if possible.  Clearly mark and make sure that you can see:  Any grab bars or handrails.  First and last steps.  Where the edge of each step is.  Use tools that help you move around (mobility aids) if they are needed. These include:  Canes.  Walkers.  Scooters.  Crutches.  Turn on the lights when you go into a dark area. Replace any light bulbs as soon as they burn out.  Set up your furniture so you have a clear path. Avoid moving your furniture around.  If any of your floors are uneven, fix them.  If there are any pets around you, be aware of where they are.  Review your medicines with your doctor. Some medicines can make you feel dizzy. This can increase your chance of falling. Ask your doctor what other things that you can do to help prevent falls. This information is not intended to replace advice given to you by your health care provider. Make sure you discuss any questions you have with your health care provider. Document Released: 12/24/2008 Document Revised: 08/05/2015 Document Reviewed: 04/03/2014 Elsevier Interactive Patient Education  2017 [employer].

## 2020-05-24 ENCOUNTER — Telehealth: Payer: Self-pay | Admitting: Family Medicine

## 2020-05-24 NOTE — Telephone Encounter (Signed)
Glucose was a little high -watching that  Try to get most of your carbohydrates from produce (with the exception of white potatoes)  Eat less bread/pasta/rice/snack foods/cereals/sweets and other items from the middle of the grocery store (processed carbs)  Monocyte level in her cbc differential is high  I doubt this is significant at all currently (other cells and total wbc nl)  We can discuss at her visit/ not worried

## 2020-05-24 NOTE — Telephone Encounter (Signed)
Pt notified of Dr. Tower's comments and verbalized understanding  

## 2020-05-24 NOTE — Telephone Encounter (Signed)
I rescheduled patient's awv to April. Patient received her lab results on my chart.  Patient wanted to know if she could receive a call letting her know her lab results.  She saw some high numbers and she didn't know what they meant.

## 2020-05-25 ENCOUNTER — Ambulatory Visit: Payer: Medicare Other | Admitting: Family Medicine

## 2020-05-31 ENCOUNTER — Other Ambulatory Visit: Payer: Self-pay | Admitting: Family Medicine

## 2020-06-04 ENCOUNTER — Other Ambulatory Visit: Payer: Self-pay | Admitting: Family Medicine

## 2020-06-16 ENCOUNTER — Ambulatory Visit: Payer: Medicare Other | Admitting: Family Medicine

## 2020-06-21 ENCOUNTER — Encounter: Payer: Self-pay | Admitting: Family Medicine

## 2020-06-21 ENCOUNTER — Ambulatory Visit (INDEPENDENT_AMBULATORY_CARE_PROVIDER_SITE_OTHER): Payer: Medicare Other | Admitting: Family Medicine

## 2020-06-21 ENCOUNTER — Other Ambulatory Visit: Payer: Self-pay

## 2020-06-21 VITALS — BP 130/70 | HR 101 | Temp 96.9°F | Ht 63.25 in | Wt 203.2 lb

## 2020-06-21 DIAGNOSIS — I10 Essential (primary) hypertension: Secondary | ICD-10-CM | POA: Diagnosis not present

## 2020-06-21 DIAGNOSIS — D72821 Monocytosis (symptomatic): Secondary | ICD-10-CM | POA: Diagnosis not present

## 2020-06-21 DIAGNOSIS — E7800 Pure hypercholesterolemia, unspecified: Secondary | ICD-10-CM

## 2020-06-21 DIAGNOSIS — R5382 Chronic fatigue, unspecified: Secondary | ICD-10-CM | POA: Diagnosis not present

## 2020-06-21 DIAGNOSIS — F5104 Psychophysiologic insomnia: Secondary | ICD-10-CM | POA: Diagnosis not present

## 2020-06-21 DIAGNOSIS — E669 Obesity, unspecified: Secondary | ICD-10-CM

## 2020-06-21 DIAGNOSIS — F418 Other specified anxiety disorders: Secondary | ICD-10-CM | POA: Diagnosis not present

## 2020-06-21 DIAGNOSIS — R7303 Prediabetes: Secondary | ICD-10-CM

## 2020-06-21 DIAGNOSIS — R5383 Other fatigue: Secondary | ICD-10-CM | POA: Insufficient documentation

## 2020-06-21 MED ORDER — METOPROLOL SUCCINATE ER 50 MG PO TB24: ORAL_TABLET | ORAL | 3 refills | Status: DC

## 2020-06-21 MED ORDER — LISINOPRIL-HYDROCHLOROTHIAZIDE 10-12.5 MG PO TABS: 0.5000 | ORAL_TABLET | Freq: Every day | ORAL | 3 refills | Status: DC

## 2020-06-21 MED ORDER — BUSPIRONE HCL 30 MG PO TABS: ORAL_TABLET | ORAL | 3 refills | Status: DC

## 2020-06-21 MED ORDER — PAROXETINE HCL 40 MG PO TABS: 60.0000 mg | ORAL_TABLET | Freq: Every day | ORAL | 3 refills | Status: DC

## 2020-06-21 MED ORDER — ROSUVASTATIN CALCIUM 10 MG PO TABS: 10.0000 mg | ORAL_TABLET | Freq: Every day | ORAL | 3 refills | Status: DC

## 2020-06-21 MED ORDER — SOLIFENACIN SUCCINATE 10 MG PO TABS: 10.0000 mg | ORAL_TABLET | Freq: Every day | ORAL | 3 refills | Status: DC | PRN

## 2020-06-21 NOTE — Assessment & Plan Note (Signed)
Struggling with husband's parkinsons diagnosis  Declines counseling/ is talking to a close friend Reviewed stressors/ coping techniques/symptoms/ support sources/ tx options and side effects in detail today Plan to continue paxil 40 mg daily and buspar 30 mg bid and ambien prn sleep with caution of sedation/fall Enc good coping mechanisms  Enc small amt of exercise inc as tolerated  inst to call if no improvement

## 2020-06-21 NOTE — Assessment & Plan Note (Signed)
Acute on chronic  May be stress related Stressed imp of self care and enc some mild exercise inc as tolerated  Lab reviewed

## 2020-06-21 NOTE — Assessment & Plan Note (Signed)
Lab Results  Component Value Date   HGBA1C 6.1 05/14/2020   disc imp of low glycemic diet and wt loss to prevent DM2

## 2020-06-21 NOTE — Assessment & Plan Note (Signed)
Continues ambien prn  Monitor for sedation/falls and habit

## 2020-06-21 NOTE — Assessment & Plan Note (Signed)
Disc goals for lipids and reasons to control them Rev last labs with pt Rev low sat fat diet in detail Well controlled with crestor 10 mg daily  LDL of 39

## 2020-06-21 NOTE — Progress Notes (Signed)
Subjective:    Patient ID: Anita Harrell, female    DOB: 25-Jul-1950, 70 y.o.   MRN: [id number]  This visit occurred during the SARS-CoV-2 public health emergency.  Safety protocols were in place, including screening questions prior to the visit, additional usage of staff PPE, and extensive cleaning of exam room while observing appropriate contact time as indicated for disinfecting solutions.    HPI Pt presents for annual f/u of chronic health problems   Wt Readings from Last 3 Encounters:  06/21/20 203 lb 3 oz (92.2 kg)  01/29/20 202 lb 9.6 oz (91.9 kg)  10/17/19 202 lb 8 oz (91.9 kg)   35.71 kg/m   Found out her husband had parkinson's  She is learning about that disorder  Very anxious about that   Very tired and brain fog   Has a best friend to talk to   [place] with her weight   Friend went on a "thyroid booster" for wt loss  Her doctor put her on low dose of thyroid medicine    covid status -immunized   zostavax 10/14  Mammogram 3/19 Self breast exam -neg for lumps  Sister had breast cancer   dexa -postponed and she still declines it Had a fall and head injury  No fractures  In brain fog since ? Post concussion  Just started ca and d  Colonoscopy 9/17 - due in 9/22  HTN bp is stable today  No cp or palpitations or headaches or edema  No side effects to medicines   BP Readings from Last 3 Encounters:  06/21/20 130/70  01/29/20 125/69  10/17/19 106/60     Taking  lisinopirl hct 10-12.5 mg 1/2 daily   Metoprolol xl 50 mg daily  Pulse Readings from Last 3 Encounters:  06/21/20 (!) 101  01/29/20 72  10/17/19 93   Mood-dep/anx hx Taking paxil 40 mg daily  buspar ambien prn 12.5 mg for sleep   Hyperlipidemia Lab Results  Component Value Date   CHOL 108 05/14/2020   CHOL 133 01/08/2019   CHOL 119 12/25/2017   Lab Results  Component Value Date   HDL 40.60 05/14/2020   HDL 44.20 01/08/2019   HDL 45.90 12/25/2017   Lab Results   Component Value Date   LDLCALC 39 05/14/2020   LDLCALC 54 01/08/2019   LDLCALC 48 12/25/2017   Lab Results  Component Value Date   TRIG 143.0 05/14/2020   TRIG 173.0 (H) 01/08/2019   TRIG 125.0 12/25/2017   Lab Results  Component Value Date   CHOLHDL 3 05/14/2020   CHOLHDL 3 01/08/2019   CHOLHDL 3 12/25/2017   Lab Results  Component Value Date   LDLDIRECT 73.0 08/21/2014   crestor 10 mg daily  Eating pretty healthy    Prediabetes Lab Results  Component Value Date   HGBA1C 6.1 05/14/2020   No sweets Watches her carbs   Lab Results  Component Value Date   CREATININE 0.65 05/14/2020   BUN 12 05/14/2020   NA 139 05/14/2020   K 4.2 05/14/2020   CL 104 05/14/2020   CO2 26 05/14/2020   Lab Results  Component Value Date   ALT 16 05/14/2020   AST 14 05/14/2020   ALKPHOS 53 05/14/2020   BILITOT 0.5 05/14/2020   Lab Results  Component Value Date   WBC 6.9 05/14/2020   HGB 13.4 05/14/2020   HCT 38.9 05/14/2020   MCV 86.9 05/14/2020   PLT 166.0 05/14/2020   Lab Results  Component Value Date   TSH 1.36 05/14/2020    Patient Active Problem List   Diagnosis Date Noted  . Fatigue 06/21/2020  . Overactive bladder 10/17/2019  . Medicare annual wellness visit, subsequent 01/16/2019  . Thyroid nodule 05/01/2018  . Abnormal findings on diagnostic imaging of lung 05/01/2018  . Electronic cigarette use 04/03/2018  . Obesity (BMI 30-39.9) 04/03/2018  . Lung mass 02/01/2018  . Abnormal chest x-ray 01/29/2018  . Tachycardia 11/01/2015  . Microscopic hematuria 08/13/2012  . Routine general medical examination at a health care facility 07/22/2012  . Leukocytosis 03/30/2010  . Prediabetes 02/21/2008  . HYPERTENSION, BENIGN ESSENTIAL 01/22/2008  . ALLERGIC RHINITIS 07/25/2007  . BACK PAIN 06/26/2007  . Personal history of goiter 07/27/2006  . Hyperlipidemia 07/27/2006  . PANIC DISORDER 07/27/2006  . Former smoker 07/27/2006  . Depression with anxiety 07/27/2006   . Insomnia 07/27/2006  . Personal history of colonic polyps 06/09/2004   Past Medical History:  Diagnosis Date  . Allergy    allergic rhinitis  . Anxiety   . Back pain   . Chest pain   . Depression   . Epigastric pain   . Goiter   . History of tobacco abuse   . Hyperglycemia    mild  . Hyperlipidemia   . Hypertension   . Insomnia    Hx of  . Labile blood pressure   . Panic disorder    History of  . Personal history of colonic polyps 06/09/2004  . SVD (spontaneous vaginal delivery)    x 2   Past Surgical History:  Procedure Laterality Date  . ABDOMINAL HYSTERECTOMY N/A 09/24/2012   Procedure: HYSTERECTOMY ABDOMINAL;  Surgeon: Gus Height, MD;  Location: [facility];  Service: Gynecology;  Laterality: N/A;  . BREAST BIOPSY    . COLONOSCOPY    . COLONOSCOPY  2017  . ELBOW SURGERY  2004  . EYE SURGERY     bilateral lasik  . SALPINGOOPHORECTOMY Bilateral 09/24/2012   Procedure: SALPINGO OOPHORECTOMY;  Surgeon: Gus Height, MD;  Location: [facility];  Service: Gynecology;  Laterality: Bilateral;  . THYROID SURGERY     B9 massess  . WART FULGURATION N/A 09/24/2012   Procedure: FULGURATION VAGINAL WART;  Surgeon: Gus Height, MD;  Location: [facility];  Service: Gynecology;  Laterality: N/A;  . WISDOM TOOTH EXTRACTION     Social History   Tobacco Use  . Smoking status: Former Smoker    Packs/day: 1.00    Years: 20.00    Pack years: 20.00    Types: Cigarettes    Quit date: 12/11/2008    Years since quitting: 11.5  . Smokeless tobacco: Never Used  Vaping Use  . Vaping Use: Never used  Substance Use Topics  . Alcohol use: No    Alcohol/week: 0.0 standard drinks  . Drug use: No   Family History  Problem Relation Age of Onset  . Hypertension Mother   . Stroke Mother   . Alcohol abuse Father   . Cancer Father        lung CA  . Heart disease Father        CHF  . Cancer Sister        breast CA  . Heart disease Sister        CHF from chemotx also has defib  . Breast cancer  Sister   . Colon cancer Neg Hx   . Esophageal cancer Neg Hx   . Rectal cancer Neg Hx   .  Stomach cancer Neg Hx    Allergies  Allergen Reactions  . Codeine Nausea And Vomiting    REACTION: Nausea and vomiting Pt has tolerated vicodin & percocet in the past  . Lipitor [Atorvastatin]     Muscle pain   . Wellbutrin [Bupropion]    Current Outpatient Medications on File Prior to Visit  Medication Sig Dispense Refill  . albuterol (VENTOLIN HFA) 108 (90 Base) MCG/ACT inhaler INHALE 2 PUFFS INTO THE LUNGS EVERY 6 HOURS AS NEEDED FOR COUGH. 18 g 3  . aspirin 81 MG tablet Take 81 mg by mouth daily.    Marland Kitchen zolpidem (AMBIEN CR) 12.5 MG CR tablet TAKE 1 TABLET BY MOUTH ONCE DAILY AT BEDTIME AS NEEDED FOR SLEEP 90 tablet 1   No current facility-administered medications on file prior to visit.    Review of Systems  Constitutional: Positive for fatigue. Negative for activity change, appetite change, fever and unexpected weight change.  HENT: Negative for congestion, ear pain, rhinorrhea, sinus pressure and sore throat.   Eyes: Negative for pain, redness and visual disturbance.  Respiratory: Negative for cough, shortness of breath and wheezing.   Cardiovascular: Negative for chest pain and palpitations.  Gastrointestinal: Negative for abdominal pain, blood in stool, constipation and diarrhea.  Endocrine: Negative for polydipsia and polyuria.  Genitourinary: Negative for dysuria, frequency and urgency.  Musculoskeletal: Positive for arthralgias. Negative for back pain and myalgias.  Skin: Negative for pallor and rash.  Allergic/Immunologic: Negative for environmental allergies.  Neurological: Negative for dizziness, syncope and headaches.  Hematological: Negative for adenopathy. Does not bruise/bleed easily.  Psychiatric/Behavioral: Negative for decreased concentration and dysphoric mood. The patient is nervous/anxious.        Objective:   Physical Exam Constitutional:      General: She is not  in acute distress.    Appearance: Normal appearance. She is well-developed. She is obese. She is not ill-appearing or diaphoretic.  HENT:     Head: Normocephalic and atraumatic.     Right Ear: Tympanic membrane, ear canal and external ear normal.     Left Ear: Tympanic membrane, ear canal and external ear normal.     Nose: Nose normal. No congestion.     Mouth/Throat:     Mouth: Mucous membranes are moist.     Pharynx: Oropharynx is clear. No posterior oropharyngeal erythema.  Eyes:     General: No scleral icterus.    Extraocular Movements: Extraocular movements intact.     Conjunctiva/sclera: Conjunctivae normal.     Pupils: Pupils are equal, round, and reactive to light.  Neck:     Thyroid: No thyromegaly.     Vascular: No carotid bruit or JVD.  Cardiovascular:     Rate and Rhythm: Regular rhythm. Tachycardia present.     Pulses: Normal pulses.     Heart sounds: Normal heart sounds. No gallop.      Comments: Pulse rate did lower after sitting  Pulmonary:     Effort: Pulmonary effort is normal. No respiratory distress.     Breath sounds: Normal breath sounds. No wheezing.     Comments: Good air exch Chest:     Chest wall: No tenderness.  Abdominal:     General: Bowel sounds are normal. There is no distension or abdominal bruit.     Palpations: Abdomen is soft. There is no mass.     Tenderness: There is no abdominal tenderness.     Hernia: No hernia is present.  Genitourinary:    Comments: Breast  exam: No mass, nodules, thickening, tenderness, bulging, retraction, inflamation, nipple discharge or skin changes noted.  No axillary or clavicular LA.     Musculoskeletal:        General: No tenderness. Normal range of motion.     Cervical back: Normal range of motion and neck supple. No rigidity. No muscular tenderness.     Right lower leg: No edema.     Left lower leg: No edema.  Lymphadenopathy:     Cervical: No cervical adenopathy.  Skin:    General: Skin is warm and dry.      Coloration: Skin is not pale.     Findings: No erythema or rash.     Comments: Solar lentigines diffusely Some small hemangiomas Also sks  Neurological:     Mental Status: She is alert. Mental status is at baseline.     Cranial Nerves: No cranial nerve deficit.     Motor: No abnormal muscle tone.     Coordination: Coordination normal.     Gait: Gait normal.     Deep Tendon Reflexes: Reflexes are normal and symmetric.  Psychiatric:        Mood and Affect: Mood normal.        Cognition and Memory: Cognition and memory normal.           Assessment & Plan:   Problem List Items Addressed This Visit      Cardiovascular and Mediastinum   HYPERTENSION, BENIGN ESSENTIAL - Primary    bp in fair control at this time  BP Readings from Last 1 Encounters:  06/21/20 130/70   No changes needed Most recent labs reviewed  Disc lifstyle change with low sodium diet and exercise  Plan to continue lisinopril hct 5-6.25 mg daily and metoprolol xl 50 mg daily  Pulse up today 2nd to anxiety       Relevant Medications   metoprolol succinate (TOPROL-XL) 50 MG 24 hr tablet   rosuvastatin (CRESTOR) 10 MG tablet   lisinopril-hydrochlorothiazide (ZESTORETIC) 10-12.5 MG tablet     Other   Hyperlipidemia    Disc goals for lipids and reasons to control them Rev last labs with pt Rev low sat fat diet in detail Well controlled with crestor 10 mg daily  LDL of 39       Relevant Medications   metoprolol succinate (TOPROL-XL) 50 MG 24 hr tablet   rosuvastatin (CRESTOR) 10 MG tablet   lisinopril-hydrochlorothiazide (ZESTORETIC) 10-12.5 MG tablet   Depression with anxiety    Struggling with husband's parkinsons diagnosis  Declines counseling/ is talking to a close friend Reviewed stressors/ coping techniques/symptoms/ support sources/ tx options and side effects in detail today Plan to continue paxil 40 mg daily and buspar 30 mg bid and ambien prn sleep with caution of sedation/fall Enc good  coping mechanisms  Enc small amt of exercise inc as tolerated  inst to call if no improvement       Relevant Medications   PARoxetine (PAXIL) 40 MG tablet   busPIRone (BUSPAR) 30 MG tablet   Prediabetes    Lab Results  Component Value Date   HGBA1C 6.1 05/14/2020   disc imp of low glycemic diet and wt loss to prevent DM2       Insomnia    Continues ambien prn  Monitor for sedation/falls and habit      Leukocytosis   Obesity (BMI 30-39.9)    Discussed how this problem influences overall health and the risks it imposes  Reviewed plan  for weight loss with lower calorie diet (via better food choices and also portion control or program like weight watchers) and exercise building up to or more than 30 minutes 5 days per week including some aerobic activity   Consider ref to healthy weight clinic Also would consider semaglutide or similar tx (in light of prediabetes) if ins covered it  She will check on this Enc to start with 5-10 min of walking per day      Fatigue    Acute on chronic  May be stress related Stressed imp of self care and enc some mild exercise inc as tolerated  Lab reviewed

## 2020-06-21 NOTE — Patient Instructions (Addendum)
There are some diabetes medicines like Semaglutide that help weight loss  (or medicines of the same class)  If your insurance paid for any of these  we could discuss it   [facility] can also [facility]- just let me know   Start with a five minute walk per day  Gradually increase that as tolerated It will take a while to overcome fatigue   Get a covid booster when you get a chance   Be sure to schedule your mammogram at the breast center   I think you are due for a colonoscopy in sept- if you need a referral let us know   Try to get 1200-1500 mg of calcium per day with at least 1000 iu of vitamin D - for bone health  Take care of yourself

## 2020-06-21 NOTE — Assessment & Plan Note (Signed)
Discussed how this problem influences overall health and the risks it imposes  Reviewed plan for weight loss with lower calorie diet (via better food choices and also portion control or program like weight watchers) and exercise building up to or more than 30 minutes 5 days per week including some aerobic activity   Consider ref to healthy weight clinic Also would consider semaglutide or similar tx (in light of prediabetes) if ins covered it  She will check on this Enc to start with 5-10 min of walking per day

## 2020-06-21 NOTE — Assessment & Plan Note (Signed)
bp in fair control at this time  BP Readings from Last 1 Encounters:  06/21/20 130/70   No changes needed Most recent labs reviewed  Disc lifstyle change with low sodium diet and exercise  Plan to continue lisinopril hct 5-6.25 mg daily and metoprolol xl 50 mg daily  Pulse up today 2nd to anxiety

## 2020-08-17 ENCOUNTER — Telehealth: Payer: Self-pay

## 2020-08-17 MED ORDER — BUSPIRONE HCL 30 MG PO TABS: ORAL_TABLET | ORAL | 2 refills | Status: DC

## 2020-08-17 NOTE — Telephone Encounter (Signed)
Gave okay to pharmacy to pick up early and pt notified

## 2020-08-17 NOTE — Telephone Encounter (Signed)
I sent it in  Please let pharmacy know it is ok to refill early   Please stress to pt the importance of keeping med under lock and key or keeping it with her  Thanks

## 2020-08-17 NOTE — Telephone Encounter (Signed)
Pt is short 7 days of buspirone. She thinks someone at her beach house took it. She will run out on 6-13 and not sue to be refilled until 6-20. Please authorize early refill to [facility].

## 2020-08-19 ENCOUNTER — Telehealth: Payer: Self-pay | Admitting: Family Medicine

## 2020-08-19 NOTE — Chronic Care Management (AMB) (Signed)
  Chronic Care Management   Note  08/19/2020 Name: Anita Harrell MRN: [id number] DOB: 1951-02-28  Anita Harrell is a 70 y.o. year old female who is a primary care patient of Tower, Wynelle Fanny, MD. I reached out to [name] by phone today in response to a referral sent by [name]'s PCP, Tower, Wynelle Fanny, MD.   [name] was given information about Chronic Care Management services today including:  CCM service includes personalized support from designated clinical staff supervised by her physician, including individualized plan of care and coordination with other care providers 24/7 contact phone numbers for assistance for urgent and routine care needs. Service will only be billed when office clinical staff spend 20 minutes or more in a month to coordinate care. Only one practitioner may furnish and bill the service in a calendar month. The patient may stop CCM services at any time (effective at the end of the month) by phone call to the office staff.   Patient agreed to services and verbal consent obtained.   Follow up plan:   Tatjana Secretary/administrator

## 2020-09-08 ENCOUNTER — Other Ambulatory Visit: Payer: Self-pay

## 2020-09-08 ENCOUNTER — Ambulatory Visit (INDEPENDENT_AMBULATORY_CARE_PROVIDER_SITE_OTHER)
Admission: RE | Admit: 2020-09-08 | Discharge: 2020-09-08 | Disposition: A | Discharge status: Home / Self Care | Payer: Medicare Other | Source: Ambulatory Visit | Attending: Family Medicine | Admitting: Family Medicine

## 2020-09-08 ENCOUNTER — Ambulatory Visit (INDEPENDENT_AMBULATORY_CARE_PROVIDER_SITE_OTHER): Payer: Medicare Other | Admitting: Family Medicine

## 2020-09-08 ENCOUNTER — Encounter: Payer: Self-pay | Admitting: Family Medicine

## 2020-09-08 DIAGNOSIS — M25551 Pain in right hip: Secondary | ICD-10-CM

## 2020-09-08 MED ORDER — MELOXICAM 15 MG PO TABS: 15.0000 mg | ORAL_TABLET | Freq: Every day | ORAL | 0 refills | Status: DC | PRN

## 2020-09-08 MED ORDER — HYDROCODONE BIT-HOMATROP MBR 5-1.5 MG/5ML PO SOLN: 5.0000 mL | ORAL | 0 refills | Status: DC | PRN

## 2020-09-08 NOTE — Patient Instructions (Signed)
Use ice on the painful area whenever you can 10 minutes at a time   Stretch  Try meloxicam 15 mg daily with food as needed (stop if side effects)   Xray now  We will call you with a result   Update [place] when you return re: how your symptoms are

## 2020-09-08 NOTE — Assessment & Plan Note (Signed)
Pain and tenderness mainly in greater trochanter area and worse with weight bearing  Xray ordered  Adv ice  Demonstrated stretches and rehab handout given  meloxicam short term daily with food as tolerated  Going on trip-if not improved upon return consider sport med visit

## 2020-09-08 NOTE — Progress Notes (Signed)
Subjective:    Patient ID: Anita Harrell, female    DOB: 1950-12-06, 70 y.o.   MRN: [id number]  This visit occurred during the SARS-CoV-2 public health emergency.  Safety protocols were in place, including screening questions prior to the visit, additional usage of staff PPE, and extensive cleaning of exam room while observing appropriate contact time as indicated for disinfecting solutions.   HPI Pt presents with R leg pain  Needs refill of hycodan for smokers cough (when severe)  Wt Readings from Last 3 Encounters:  09/08/20 208 lb 6 oz (94.5 kg)  06/21/20 203 lb 3 oz (92.2 kg)  01/29/20 202 lb 9.6 oz (91.9 kg)   36.62 kg/m  Upper R leg  When weight bearing  Pain in lateral upper thigh-esp when she steps up  Lie/sit -ok  Does not hurt when not using it   No swelling No rash or skin change  A little tender Used some ice (it does help)    Leaving for the beach- 3 stories /worried  Patient Active Problem List   Diagnosis Date Noted   Right hip pain 09/08/2020   Fatigue 06/21/2020   Overactive bladder 10/17/2019   Medicare annual wellness visit, subsequent 01/16/2019   Thyroid nodule 05/01/2018   Abnormal findings on diagnostic imaging of lung 05/01/2018   Electronic cigarette use 04/03/2018   Obesity (BMI 30-39.9) 04/03/2018   Lung mass 02/01/2018   Abnormal chest x-ray 01/29/2018   Tachycardia 11/01/2015   Microscopic hematuria 08/13/2012   Routine general medical examination at a health care facility 07/22/2012   Leukocytosis 03/30/2010   Prediabetes 02/21/2008   HYPERTENSION, BENIGN ESSENTIAL 01/22/2008   ALLERGIC RHINITIS 07/25/2007   BACK PAIN 06/26/2007   Personal history of goiter 07/27/2006   Hyperlipidemia 07/27/2006   PANIC DISORDER 07/27/2006   Former smoker 07/27/2006   Depression with anxiety 07/27/2006   Insomnia 07/27/2006   Personal history of colonic polyps 06/09/2004   Past Medical History:  Diagnosis Date   Allergy    allergic  rhinitis   Anxiety    Back pain    Chest pain    Depression    Epigastric pain    Goiter    History of tobacco abuse    Hyperglycemia    mild   Hyperlipidemia    Hypertension    Insomnia    Hx of   Labile blood pressure    Panic disorder    History of   Personal history of colonic polyps 06/09/2004   SVD (spontaneous vaginal delivery)    x 2   Past Surgical History:  Procedure Laterality Date   ABDOMINAL HYSTERECTOMY N/A 09/24/2012   Procedure: HYSTERECTOMY ABDOMINAL;  Surgeon: Gus Height, MD;  Location: [facility];  Service: Gynecology;  Laterality: N/A;   BREAST BIOPSY     COLONOSCOPY     COLONOSCOPY  2017   ELBOW SURGERY  2004   EYE SURGERY     bilateral lasik   SALPINGOOPHORECTOMY Bilateral 09/24/2012   Procedure: SALPINGO OOPHORECTOMY;  Surgeon: Gus Height, MD;  Location: [facility];  Service: Gynecology;  Laterality: Bilateral;   THYROID SURGERY     B9 massess   WART FULGURATION N/A 09/24/2012   Procedure: FULGURATION VAGINAL WART;  Surgeon: Gus Height, MD;  Location: [facility];  Service: Gynecology;  Laterality: N/A;   WISDOM TOOTH EXTRACTION     Social History   Tobacco Use   Smoking status: Former    Packs/day: 1.00    Years: 20.00  Pack years: 20.00    Types: Cigarettes    Quit date: 12/11/2008    Years since quitting: 11.7   Smokeless tobacco: Never  Vaping Use   Vaping Use: Never used  Substance Use Topics   Alcohol use: No    Alcohol/week: 0.0 standard drinks   Drug use: No   Family History  Problem Relation Age of Onset   Hypertension Mother    Stroke Mother    Alcohol abuse Father    Cancer Father        lung CA   Heart disease Father        CHF   Cancer Sister        breast CA   Heart disease Sister        CHF from chemotx also has defib   Breast cancer Sister    Colon cancer Neg Hx    Esophageal cancer Neg Hx    Rectal cancer Neg Hx    Stomach cancer Neg Hx    Allergies  Allergen Reactions   Codeine Nausea And Vomiting    REACTION:  Nausea and vomiting Pt has tolerated vicodin & percocet in the past   Lipitor [Atorvastatin]     Muscle pain    Wellbutrin [Bupropion]    Current Outpatient Medications on File Prior to Visit  Medication Sig Dispense Refill   albuterol (VENTOLIN HFA) 108 (90 Base) MCG/ACT inhaler INHALE 2 PUFFS INTO THE LUNGS EVERY 6 HOURS AS NEEDED FOR COUGH. 18 g 3   aspirin 81 MG tablet Take 81 mg by mouth daily.     busPIRone (BUSPAR) 30 MG tablet TAKE 1/2 TO 1 TABLET (15-30 MG TOTAL) BYMOUTH 3 TIMES DAILY AS DIRECTED 270 tablet 2   lisinopril-hydrochlorothiazide (ZESTORETIC) 10-12.5 MG tablet Take 0.5 tablets by mouth daily. 45 tablet 3   metoprolol succinate (TOPROL-XL) 50 MG 24 hr tablet TAKE 1 TABLET BY MOUTH ONCE DAILY *TAKE WITH OR IMMEDIATELY FOLLOWING A MEAL* 90 tablet 3   PARoxetine (PAXIL) 40 MG tablet Take 1.5 tablets (60 mg total) by mouth daily. 135 tablet 3   rosuvastatin (CRESTOR) 10 MG tablet Take 1 tablet (10 mg total) by mouth daily. 90 tablet 3   solifenacin (VESICARE) 10 MG tablet Take 1 tablet (10 mg total) by mouth daily as needed. 90 tablet 3   zolpidem (AMBIEN CR) 12.5 MG CR tablet TAKE 1 TABLET BY MOUTH ONCE DAILY AT BEDTIME AS NEEDED FOR SLEEP 90 tablet 1   No current facility-administered medications on file prior to visit.     Review of Systems  Constitutional:  Positive for fatigue. Negative for activity change, appetite change, fever and unexpected weight change.  HENT:  Negative for congestion, ear pain, rhinorrhea, sinus pressure and sore throat.   Eyes:  Negative for pain, redness and visual disturbance.  Respiratory:  Negative for cough, shortness of breath and wheezing.   Cardiovascular:  Negative for chest pain and palpitations.  Gastrointestinal:  Negative for abdominal pain, blood in stool, constipation and diarrhea.  Endocrine: Negative for polydipsia and polyuria.  Genitourinary:  Negative for dysuria, frequency and urgency.  Musculoskeletal:  Positive for  arthralgias. Negative for back pain and myalgias.  Skin:  Negative for pallor and rash.  Allergic/Immunologic: Negative for environmental allergies.  Neurological:  Negative for dizziness, syncope and headaches.  Hematological:  Negative for adenopathy. Does not bruise/bleed easily.  Psychiatric/Behavioral:  Negative for decreased concentration and dysphoric mood. The patient is not nervous/anxious.  Objective:   Physical Exam Constitutional:      General: She is not in acute distress.    Appearance: Normal appearance. She is obese. She is not ill-appearing.  Eyes:     General:        Right eye: No discharge.        Left eye: No discharge.     Conjunctiva/sclera: Conjunctivae normal.     Pupils: Pupils are equal, round, and reactive to light.  Cardiovascular:     Rate and Rhythm: Normal rate and regular rhythm.     Pulses: Normal pulses.     Heart sounds: Normal heart sounds.  Pulmonary:     Effort: No respiratory distress.  Musculoskeletal:     Cervical back: Normal range of motion and neck supple.     Right hip: Tenderness and bony tenderness present. No deformity or crepitus. Normal range of motion. Normal strength.     Right upper leg: Tenderness and bony tenderness present. No swelling.     Comments: Tender over greater trochanter and slt over R SI joint Nl flex and rotation of hip  Gait favors L leg     Neurological:     Mental Status: She is alert.     Sensory: No sensory deficit.     Motor: No weakness.     Deep Tendon Reflexes: Reflexes normal.  Psychiatric:        Mood and Affect: Mood normal.          Assessment & Plan:   Problem List Items Addressed This Visit       Other   Right hip pain    Pain and tenderness mainly in greater trochanter area and worse with weight bearing  Xray ordered  Adv ice  Demonstrated stretches and rehab handout given  meloxicam short term daily with food as tolerated  Going on trip-if not improved upon return  consider sport med visit        Relevant Orders   DG HIP UNILAT WITH PELVIS 2-3 VIEWS RIGHT (Completed)

## 2020-09-13 DIAGNOSIS — Z20822 Contact with and (suspected) exposure to covid-19: Secondary | ICD-10-CM | POA: Diagnosis not present

## 2020-10-01 ENCOUNTER — Other Ambulatory Visit: Payer: Self-pay | Admitting: Family Medicine

## 2020-10-04 NOTE — Telephone Encounter (Signed)
Looks to be a little early.  Send back to me when time, thanks

## 2020-10-04 NOTE — Telephone Encounter (Addendum)
Patient called left message on triage voicemail stating that she is out of her Lorrin Mais and has none for tonight.    She understands from the pharmacy that we are saying that the RX is being filled too early.  She wants to know if there anything we can do about getting it filled? She last picked up in April and isn't sure why it is early.   Given patient's dose, I was concerned for any possible withdrawal symptoms for tonight.  As we did not get patient's message until 5pm given she left it after 4pm, I reviewed with physician still in office.   Dr. Danise Mina feels it is safe to hold for Dr. Glori Bickers to review in the morning.

## 2020-10-04 NOTE — Telephone Encounter (Signed)
Name of Medication: Zolpidem Name of Pharmacy: [facility] or Written Date and Quantity: 04/12/20 #90 with 1 refill Last Office Visit and Type: 09/08/20 leg pain/issues Next Office Visit and Type: none scheduled  Last Controlled Substance Agreement Date: 07/13/2015 Last UDS: 07/13/2015

## 2020-10-05 ENCOUNTER — Other Ambulatory Visit: Payer: Self-pay | Admitting: Family Medicine

## 2020-10-05 NOTE — Telephone Encounter (Signed)
Checked the state database and pt had a refill of med on 04/12/20 #90 tabs, then 07/07/20 #90 tabs (that was 3 days to soon), see other refill request on Rx that [facility] sent yesterday saying pt was completely out of med yesterday

## 2020-10-05 NOTE — Telephone Encounter (Signed)
Anita Harrell called in and wanted to know about getting a prescription, the pharmacy told her it was too early and she stated that she is out of the medication. And she stated that she doesn't like being treated like this and she has been here at [facility] for the past 28yr.

## 2020-10-15 ENCOUNTER — Telehealth: Payer: Medicare Other

## 2020-10-18 ENCOUNTER — Telehealth: Payer: Medicare Other

## 2020-11-04 ENCOUNTER — Ambulatory Visit (INDEPENDENT_AMBULATORY_CARE_PROVIDER_SITE_OTHER): Payer: Medicare Other | Admitting: Family Medicine

## 2020-11-04 ENCOUNTER — Other Ambulatory Visit: Payer: Self-pay

## 2020-11-04 ENCOUNTER — Encounter: Payer: Self-pay | Admitting: Family Medicine

## 2020-11-04 VITALS — BP 110/70 | HR 83 | Temp 97.8°F | Ht 63.25 in | Wt 209.0 lb

## 2020-11-04 DIAGNOSIS — M65251 Calcific tendinitis, right thigh: Secondary | ICD-10-CM

## 2020-11-04 DIAGNOSIS — M7061 Trochanteric bursitis, right hip: Secondary | ICD-10-CM | POA: Diagnosis not present

## 2020-11-04 MED ORDER — TRIAMCINOLONE ACETONIDE 40 MG/ML IJ SUSP
40.0000 mg | Freq: Once | INTRAMUSCULAR | Status: AC
  Administered 2020-11-04: 40 mg via INTRA_ARTICULAR

## 2020-11-04 NOTE — Progress Notes (Signed)
Anita Wendt T. Malakhi Markwood, MD, Anita Harrell at [facility] [place] Anita Harrell, [address]  Phone: [phone]  FAX: [phone]  LITTLE CRATER - 70 y.o. female  MRN [id number]  Date of Birth: 1950-05-15  Date: 11/04/2020  PCP: Abner Greenspan, MD  Referral: Abner Greenspan, MD  Chief Complaint  Patient presents with   Hip Pain    Right    This visit occurred during the SARS-CoV-2 public health emergency.  Safety protocols were in place, including screening questions prior to the visit, additional usage of staff PPE, and extensive cleaning of exam room while observing appropriate contact time as indicated for disinfecting solutions.   Subjective:   Anita Harrell is a 70 y.o. very pleasant female patient with Body mass index is 36.73 kg/m. who presents with the following:  Has had some R sided sciatica years ago.  At this point she does not have any posterior back pain and no radicular pain.  She does have some pain at the lateral hip with some pain going down the lateral thigh.  Now in the anterior of her hip.  Having a hard time anteriorly and laterally on the R side.  Cant put weight on the R side very well.  Fairly immobile for the last few months.   The radiological images were independently reviewed by myself in the office and results were reviewed with the patient. My independent interpretation of images: I do mostly agree with radiology, but the patient does have evidence of radiopacity at the greater trochanter which is most consistent with calcification along the lateral hip musculature versus calcification in the bursa itself.  This does correlate to the patient's maximum area of pain.  No numbness or strength deficit.  CLINICAL DATA:  70 year old female with right lateral hip pain.   EXAM: DG HIP (WITH OR WITHOUT PELVIS) 2-3V RIGHT   COMPARISON:  None.   FINDINGS: There is no evidence of hip fracture or  dislocation. There is no evidence of arthropathy or other focal bone abnormality.   IMPRESSION: No acute fracture, malalignment, or significant degenerative change.     Electronically Signed   By: Ruthann Cancer MD   On: 09/08/2020 13:22   Review of Systems is noted in the HPI, as appropriate  Objective:   BP 110/70   Pulse 83   Temp 97.8 F (36.6 C) (Temporal)   Ht 5' 3.25" (1.607 m)   Wt 209 lb (94.8 kg)   SpO2 96%   BMI 36.73 kg/m   GEN: No acute distress; alert,appropriate. PULM: Breathing comfortably in no respiratory distress PSYCH: Normally interactive.    Range of motion at  the waist: Flexion: normal Extension: normal Lateral bending: normal Rotation: all normal  No echymosis or edema Rises to examination table with no difficulty Gait: non antalgic  Inspection/Deformity: N Paraspinus Tenderness: Minimal to none  B Ankle Dorsiflexion (L5,4): 5/5 B Great Toe Dorsiflexion (L5,4): 5/5 Heel Walk (L5): WNL Toe Walk (S1): WNL Rise/Squat (L4): WNL  SENSORY B Medial Foot (L4): WNL B Dorsum (L5): WNL B Lateral (S1): WNL Light Touch: WNL Pinprick: WNL  REFLEXES Knee (L4): 2+ Ankle (S1): 2+  B SLR, seated: neg B SLR, supine: neg B FABER: neg B Reverse FABER: neg B Greater Troch: Notably tender on the right B Log Roll: neg B Sciatic Notch: NT   Laboratory and Imaging Data: CLINICAL DATA:  70 year old female with right lateral hip pain.  EXAM: DG HIP (WITH OR WITHOUT PELVIS) 2-3V RIGHT   COMPARISON:  None.   FINDINGS: There is no evidence of hip fracture or dislocation. There is no evidence of arthropathy or other focal bone abnormality.   IMPRESSION: No acute fracture, malalignment, or significant degenerative change.     Electronically Signed   By: Ruthann Cancer MD   On: 09/08/2020 13:22  Assessment and Plan:     ICD-10-CM   1. Trochanteric bursitis of right hip  M70.61 triamcinolone acetonide (KENALOG-40) injection 40 mg     2. Calcific tendinitis of right hip  M65.251      I do not think this is a primary back problem more radicular.  She does have pain directly at the lateral hip and some pain going down the lateral aspect of the thigh which certainly can be the tensor fascia lata or the iliotibial band.  Notable calcific at the [place].  I am going to do a diagnostic and therapeutic injection of the right hip, and hope that this relieves her symptoms.  Also going to have her walk normally and do some basic hip motion and strength.  Her pain is impacting a lot of her quality life, and I am going to see her back in 6 weeks.  Aspiration/Injection Procedure Note EWA KOT [birth year] Date of procedure: 11/04/2020  Procedure: Large Joint Aspiration / Injection of Hip, Trochanteric Bursa, R Indications: Pain  Procedure Details Verbal consent obtained. Risks, benefits, and alternatives reviewed. Greater trochanter sterilely prepped with Chloraprep. Ethyl Chloride used for anesthesia. 9 cc of Lidocaine 1% injected with 1 mL of Kenalog 40 mg into trochanteric bursa at area of maximal tenderness at greater trochanter. Needle taken to bone to troch bursa, flows easily. Bursa massaged. No bleeding and no complications. Decreased pain after injection. Needle: 22 gauge spinal needle Medication: 1 mL of Kenalog 40 mg   Meds ordered this encounter  Medications   triamcinolone acetonide (KENALOG-40) injection 40 mg   There are no discontinued medications. No orders of the defined types were placed in this encounter.   Follow-up: Return in about 6 weeks (around 12/16/2020).  Dragon [employer] One speech-to-text software was used for transcription in this dictation.  Possible transcriptional errors can occur using Editor, commissioning.   Signed,  [name]. [name], MD   Outpatient Encounter Medications as of 11/04/2020  Medication Sig   albuterol (VENTOLIN HFA) 108 (90 Base) MCG/ACT inhaler INHALE 2 PUFFS INTO THE  LUNGS EVERY 6 HOURS AS NEEDED FOR COUGH.   aspirin 81 MG tablet Take 81 mg by mouth daily.   busPIRone (BUSPAR) 30 MG tablet TAKE 1/2 TO 1 TABLET (15-30 MG TOTAL) BYMOUTH 3 TIMES DAILY AS DIRECTED   HYDROcodone bit-homatropine (HYCODAN) 5-1.5 MG/5ML syrup Take 5 mLs by mouth as needed.   lisinopril-hydrochlorothiazide (ZESTORETIC) 10-12.5 MG tablet Take 0.5 tablets by mouth daily.   metoprolol succinate (TOPROL-XL) 50 MG 24 hr tablet TAKE 1 TABLET BY MOUTH ONCE DAILY *TAKE WITH OR IMMEDIATELY FOLLOWING A MEAL*   PARoxetine (PAXIL) 40 MG tablet Take 1.5 tablets (60 mg total) by mouth daily.   rosuvastatin (CRESTOR) 10 MG tablet Take 1 tablet (10 mg total) by mouth daily.   solifenacin (VESICARE) 10 MG tablet Take 1 tablet (10 mg total) by mouth daily as needed.   zolpidem (AMBIEN CR) 12.5 MG CR tablet TAKE 1 TABLET BY MOUTH ONCE DAILY AT BEDTIME AS NEEDED FOR SLEEP   meloxicam (MOBIC) 15 MG tablet Take 1 tablet (15  mg total) by mouth daily as needed for pain. With a meal (Patient not taking: Reported on 11/04/2020)   [EXPIRED] triamcinolone acetonide (KENALOG-40) injection 40 mg    No facility-administered encounter medications on file as of 11/04/2020.

## 2020-11-07 ENCOUNTER — Encounter: Payer: Self-pay | Admitting: Family Medicine

## 2020-12-14 ENCOUNTER — Other Ambulatory Visit: Payer: Self-pay | Admitting: Family Medicine

## 2020-12-14 DIAGNOSIS — J410 Simple chronic bronchitis: Secondary | ICD-10-CM

## 2020-12-15 NOTE — Telephone Encounter (Signed)
Name of Medication: Hycodan Name of Pharmacy: [facility] Date and Quantity: 09/08/20 #60 ml/ 0 refill Last Office Visit and Type: hip pain (Dr. Lorelei Pont) 11/04/20 Next Office Visit and Type: f/u w/ Dr. Lorelei Pont 01/05/21

## 2020-12-15 NOTE — Telephone Encounter (Signed)
Please get some details from her re: cough  Any different or new? Productive Sob or fever ? Any other symptoms?

## 2020-12-17 NOTE — Telephone Encounter (Signed)
Called pt and she said that PCP prescribes it for her to keep on hand due to her Chronic smokers cough. Pt said she doesn't use it often and that's why the last time it was filled was June. Pt said it's mainly at night she sometimes wakes up with her smokers cough and congestion but it's nothing new and no new sxs.

## 2020-12-17 NOTE — Assessment & Plan Note (Signed)
Takes hycodan occasionally with caution

## 2020-12-30 ENCOUNTER — Other Ambulatory Visit: Payer: Self-pay | Admitting: Family Medicine

## 2020-12-31 NOTE — Telephone Encounter (Signed)
Name of Medication: Ambien Cr Name of Pharmacy: [facility] or Written Date and Quantity: 10/05/20 #90 tab/ 0 refills Last Office Visit and Type: leg issues 09/08/20 (CPE on 06/21/20) Next Office Visit and Type: none scheduled

## 2021-01-04 ENCOUNTER — Ambulatory Visit: Payer: Medicare Other | Admitting: Family Medicine

## 2021-01-05 ENCOUNTER — Ambulatory Visit: Payer: Medicare Other | Admitting: Family Medicine

## 2021-01-18 DIAGNOSIS — Z20828 Contact with and (suspected) exposure to other viral communicable diseases: Secondary | ICD-10-CM | POA: Diagnosis not present

## 2021-03-07 ENCOUNTER — Other Ambulatory Visit: Payer: Self-pay | Admitting: Family Medicine

## 2021-03-17 ENCOUNTER — Other Ambulatory Visit: Payer: Self-pay | Admitting: Family Medicine

## 2021-03-17 NOTE — Telephone Encounter (Signed)
Name of Medication: Ambien Cr Name of Pharmacy: [facility] or Written Date and Quantity: 12/31/20 #90 tab/ 0 refills Last Office Visit and Type: leg issues 09/08/20 (CPE on 06/21/20) Next Office Visit and Type: none scheduled

## 2021-03-21 ENCOUNTER — Telehealth: Payer: Self-pay | Admitting: Family Medicine

## 2021-03-21 NOTE — Telephone Encounter (Signed)
error 

## 2021-03-28 ENCOUNTER — Other Ambulatory Visit: Payer: Self-pay | Admitting: Family Medicine

## 2021-03-30 ENCOUNTER — Encounter: Payer: Self-pay | Admitting: Family Medicine

## 2021-04-15 DIAGNOSIS — Z20822 Contact with and (suspected) exposure to covid-19: Secondary | ICD-10-CM | POA: Diagnosis not present

## 2021-05-04 ENCOUNTER — Encounter: Payer: Self-pay | Admitting: Family Medicine

## 2021-05-04 ENCOUNTER — Telehealth (INDEPENDENT_AMBULATORY_CARE_PROVIDER_SITE_OTHER): Payer: Medicare Other | Admitting: Family Medicine

## 2021-05-04 ENCOUNTER — Other Ambulatory Visit: Payer: Self-pay

## 2021-05-04 VITALS — Ht 63.25 in | Wt 198.0 lb

## 2021-05-04 DIAGNOSIS — F418 Other specified anxiety disorders: Secondary | ICD-10-CM

## 2021-05-04 DIAGNOSIS — R197 Diarrhea, unspecified: Secondary | ICD-10-CM | POA: Diagnosis not present

## 2021-05-04 DIAGNOSIS — J410 Simple chronic bronchitis: Secondary | ICD-10-CM

## 2021-05-04 DIAGNOSIS — R112 Nausea with vomiting, unspecified: Secondary | ICD-10-CM | POA: Insufficient documentation

## 2021-05-04 MED ORDER — ALBUTEROL SULFATE HFA 108 (90 BASE) MCG/ACT IN AERS: INHALATION_SPRAY | RESPIRATORY_TRACT | 3 refills | Status: DC

## 2021-05-04 MED ORDER — HYDROCODONE BIT-HOMATROP MBR 5-1.5 MG/5ML PO SOLN: ORAL | 0 refills | Status: DC

## 2021-05-04 NOTE — Patient Instructions (Signed)
Drink fluids Eat bland If GI symptoms return please let us know  If severe go to the ER  I sent in cough syrup and inhaler   Follow up for annual exam in march or later

## 2021-05-04 NOTE — Assessment & Plan Note (Signed)
Has occ more anxious days or nights but overall buspar helps tremendously  Encouraged good self care and some exercise if tolerated

## 2021-05-04 NOTE — Progress Notes (Deleted)
Virtual Visit via Telephone Note  I connected with Anita Harrell on 05/04/21 at 11:30 AM EST by telephone and verified that I am speaking with the correct person using two identifiers.  Location: Patient: home Provider: office    I discussed the limitations, risks, security and privacy concerns of performing an evaluation and management service by telephone and the availability of in person appointments. I also discussed with the patient that there may be a patient responsible charge related to this service. The patient expressed understanding and agreed to proceed.  Parties involved in encounter  Patient: Anita Harrell  Provider:  Loura Pardon MD   History of Present Illness: Pt presents with nausea /vomiting and diarrhea   Started this am  Doing just a little better   Ate fish last night and thinks it may have gone bad  Woke up very early  N/v/d   Could have been from anxiety also   Her husband did not get sick   A few time  Doing better now  No abd pain  No fever or chills   Back to normal now  Not dehydrated or dizzy    She has quit sugar- soft drinks and sweets  Lost about 6 lb  Really proud of it  More fruits and veggies and lean protein   Got some info from ins co asking if she wanted extra screening for AAA, a fib etc  Patient Active Problem List   Diagnosis Date Noted   Nausea vomiting and diarrhea 05/04/2021   Right hip pain 09/08/2020   Fatigue 06/21/2020   Overactive bladder 10/17/2019   Medicare annual wellness visit, subsequent 01/16/2019   Thyroid nodule 05/01/2018   Abnormal findings on diagnostic imaging of lung 05/01/2018   Electronic cigarette use 04/03/2018   Obesity (BMI 30-39.9) 04/03/2018   Lung mass 02/01/2018   Abnormal chest x-ray 01/29/2018   Smokers' cough ([place]) 01/01/2018   Tachycardia 11/01/2015   Microscopic hematuria 08/13/2012   Routine general medical examination at a health care facility 07/22/2012   Leukocytosis  03/30/2010   Prediabetes 02/21/2008   HYPERTENSION, BENIGN ESSENTIAL 01/22/2008   ALLERGIC RHINITIS 07/25/2007   BACK PAIN 06/26/2007   Personal history of goiter 07/27/2006   Hyperlipidemia 07/27/2006   PANIC DISORDER 07/27/2006   Former smoker 07/27/2006   Depression with anxiety 07/27/2006   Insomnia 07/27/2006   Personal history of colonic polyps 06/09/2004   Past Medical History:  Diagnosis Date   Allergy    allergic rhinitis   Anxiety    Back pain    Chest pain    Depression    Epigastric pain    Goiter    History of tobacco abuse    Hyperglycemia    mild   Hyperlipidemia    Hypertension    Insomnia    Hx of   Labile blood pressure    Panic disorder    History of   Personal history of colonic polyps 06/09/2004   SVD (spontaneous vaginal delivery)    x 2   Past Surgical History:  Procedure Laterality Date   ABDOMINAL HYSTERECTOMY N/A 09/24/2012   Procedure: HYSTERECTOMY ABDOMINAL;  Surgeon: Gus Height, MD;  Location: [facility];  Service: Gynecology;  Laterality: N/A;   BREAST BIOPSY     COLONOSCOPY     COLONOSCOPY  2017   ELBOW SURGERY  2004   EYE SURGERY     bilateral lasik   SALPINGOOPHORECTOMY Bilateral 09/24/2012   Procedure: SALPINGO OOPHORECTOMY;  Surgeon:  Gus Height, MD;  Location: [facility];  Service: Gynecology;  Laterality: Bilateral;   THYROID SURGERY     B9 massess   WART FULGURATION N/A 09/24/2012   Procedure: FULGURATION VAGINAL WART;  Surgeon: Gus Height, MD;  Location: [facility];  Service: Gynecology;  Laterality: N/A;   WISDOM TOOTH EXTRACTION     Social History   Tobacco Use   Smoking status: Former    Packs/day: 1.00    Years: 20.00    Pack years: 20.00    Types: Cigarettes    Quit date: 12/11/2008    Years since quitting: 12.4   Smokeless tobacco: Never  Vaping Use   Vaping Use: Never used  Substance Use Topics   Alcohol use: No    Alcohol/week: 0.0 standard drinks   Drug use: No   Family History  Problem Relation Age of Onset    Hypertension Mother    Stroke Mother    Alcohol abuse Father    Cancer Father        lung CA   Heart disease Father        CHF   Cancer Sister        breast CA   Heart disease Sister        CHF from chemotx also has defib   Breast cancer Sister    Colon cancer Neg Hx    Esophageal cancer Neg Hx    Rectal cancer Neg Hx    Stomach cancer Neg Hx    Allergies  Allergen Reactions   Codeine Nausea And Vomiting    REACTION: Nausea and vomiting Pt has tolerated vicodin & percocet in the past   Lipitor [Atorvastatin]     Muscle pain    Wellbutrin [Bupropion]    Current Outpatient Medications on File Prior to Visit  Medication Sig Dispense Refill   aspirin 81 MG tablet Take 81 mg by mouth daily.     busPIRone (BUSPAR) 30 MG tablet TAKE 1/2 TO 1 TABLET (15-30 MG TOTAL) BYMOUTH 3 TIMES DAILY AS DIRECTED 270 tablet 2   lisinopril-hydrochlorothiazide (ZESTORETIC) 10-12.5 MG tablet TAKE ONE HALF TABLET BY MOUTH DAILY 45 tablet 0   meloxicam (MOBIC) 15 MG tablet Take 1 tablet (15 mg total) by mouth daily as needed for pain. With a meal 30 tablet 0   metoprolol succinate (TOPROL-XL) 50 MG 24 hr tablet TAKE 1 TABLET BY MOUTH ONCE DAILY *TAKE WITH OR IMMEDIATELY FOLLOWING A MEAL* 90 tablet 3   PARoxetine (PAXIL) 40 MG tablet Take 1.5 tablets (60 mg total) by mouth daily. 135 tablet 3   rosuvastatin (CRESTOR) 10 MG tablet Take 1 tablet (10 mg total) by mouth daily. 90 tablet 3   solifenacin (VESICARE) 10 MG tablet Take 1 tablet (10 mg total) by mouth daily as needed. 90 tablet 3   zolpidem (AMBIEN CR) 12.5 MG CR tablet TAKE 1 TABLET BY MOUTH ONCE DAILY AT BEDTIME AS NEEDED FOR SLEEP 90 tablet 0   No current facility-administered medications on file prior to visit.    Review of Systems  Constitutional:  Negative for chills, fever and malaise/fatigue.  HENT:  Negative for congestion, ear pain, sinus pain and sore throat.   Eyes:  Negative for blurred vision, discharge and redness.  Respiratory:   Negative for cough, shortness of breath and stridor.   Cardiovascular:  Negative for chest pain, palpitations and leg swelling.  Gastrointestinal:  Positive for diarrhea, nausea and vomiting. Negative for abdominal pain, blood in stool, constipation, heartburn  and melena.  Musculoskeletal:  Negative for myalgias.  Skin:  Negative for rash.  Neurological:  Negative for dizziness and headaches.  Psychiatric/Behavioral:  The patient is nervous/anxious.      Observations/Objective: Pt sounds well In no distress Not hoarse /nl speech Mildly anxious  Candidly discusses symptoms and stressors  No cough or sob with speech  Nl cognition/good historian  Assessment and Harrell:  Problem List Items Addressed This Visit       Respiratory   Smokers' cough ([place])    Needs refill of hycodan and inhaler today  No significant change  Smoked in the past        Digestive   Nausea vomiting and diarrhea - Primary    This occurred very early this am and has already resolved  Thinks it was either rxn to the fish she ate last night (husb is fine) or from anxiety  No s/s of dehydration  No fever or abd pain  Will watch closely  Adv she eat bland and drink fluids Update if not starting to improve in a week or if worsening  ER precautions reviewed         Other   Depression with anxiety    Has occ more anxious days or nights but overall buspar helps tremendously  Encouraged good self care and some exercise if tolerated       Follow Up Instructions: Drink fluids Eat bland If GI symptoms return please let us know  If severe go to the ER  I sent in cough syrup and inhaler   Follow up for annual exam in march or later    I discussed the assessment and treatment Harrell with the patient. The patient was provided an opportunity to ask questions and all were answered. The patient agreed with the Harrell and demonstrated an understanding of the instructions.   The patient was advised to call back  or seek an in-person evaluation if the symptoms worsen or if the condition fails to improve as anticipated.  I provided 17 minutes of non-face-to-face time during this encounter.   Loura Pardon, MD

## 2021-05-04 NOTE — Progress Notes (Signed)
My Note Deleted  12:57 PM   Virtual Visit via Telephone Note   I connected with Anita Harrell on 05/04/21 at 11:30 AM EST by telephone and verified that I am speaking with the correct person using two identifiers.   Location: Patient: home Provider: office     I discussed the limitations, risks, security and privacy concerns of performing an evaluation and management service by telephone and the availability of in person appointments. I also discussed with the patient that there may be a patient responsible charge related to this service. The patient expressed understanding and agreed to proceed.   Parties involved in encounter   Patient: Anita Harrell Plan   Provider:  Loura Pardon MD    History of Present Illness: Pt presents with nausea /vomiting and diarrhea    Started this am  Doing just a little better    Ate fish last night and thinks it may have gone bad  Woke up very early  N/v/d    Could have been from anxiety also    Her husband did not get sick    A few time  Doing better now  No abd pain  No fever or chills    Back to normal now  Not dehydrated or dizzy      She has quit sugar- soft drinks and sweets  Lost about 6 lb  Really proud of it  More fruits and veggies and lean protein    Got some info from ins co asking if she wanted extra screening for AAA, a fib etc       Patient Active Problem List    Diagnosis Date Noted   Nausea vomiting and diarrhea 05/04/2021   Right hip pain 09/08/2020   Fatigue 06/21/2020   Overactive bladder 10/17/2019   Medicare annual wellness visit, subsequent 01/16/2019   Thyroid nodule 05/01/2018   Abnormal findings on diagnostic imaging of lung 05/01/2018   Electronic cigarette use 04/03/2018   Obesity (BMI 30-39.9) 04/03/2018   Lung mass 02/01/2018   Abnormal chest x-ray 01/29/2018   Smokers' cough ([place]) 01/01/2018   Tachycardia 11/01/2015   Microscopic hematuria 08/13/2012   Routine general medical  examination at a health care facility 07/22/2012   Leukocytosis 03/30/2010   Prediabetes 02/21/2008   HYPERTENSION, BENIGN ESSENTIAL 01/22/2008   ALLERGIC RHINITIS 07/25/2007   BACK PAIN 06/26/2007   Personal history of goiter 07/27/2006   Hyperlipidemia 07/27/2006   PANIC DISORDER 07/27/2006   Former smoker 07/27/2006   Depression with anxiety 07/27/2006   Insomnia 07/27/2006   Personal history of colonic polyps 06/09/2004        Past Medical History:  Diagnosis Date   Allergy      allergic rhinitis   Anxiety     Back pain     Chest pain     Depression     Epigastric pain     Goiter     History of tobacco abuse     Hyperglycemia      mild   Hyperlipidemia     Hypertension     Insomnia      Hx of   Labile blood pressure     Panic disorder      History of   Personal history of colonic polyps 06/09/2004   SVD (spontaneous vaginal delivery)      x 2         Past Surgical History:  Procedure Laterality Date   ABDOMINAL HYSTERECTOMY N/A 09/24/2012  Procedure: HYSTERECTOMY ABDOMINAL;  Surgeon: Gus Height, MD;  Location: [facility];  Service: Gynecology;  Laterality: N/A;   BREAST BIOPSY       COLONOSCOPY       COLONOSCOPY   2017   ELBOW SURGERY   2004   EYE SURGERY        bilateral lasik   SALPINGOOPHORECTOMY Bilateral 09/24/2012    Procedure: SALPINGO OOPHORECTOMY;  Surgeon: Gus Height, MD;  Location: [facility];  Service: Gynecology;  Laterality: Bilateral;   THYROID SURGERY        B9 massess   WART FULGURATION N/A 09/24/2012    Procedure: FULGURATION VAGINAL WART;  Surgeon: Gus Height, MD;  Location: [facility];  Service: Gynecology;  Laterality: N/A;   WISDOM TOOTH EXTRACTION        Social History         Tobacco Use   Smoking status: Former      Packs/day: 1.00      Years: 20.00      Pack years: 20.00      Types: Cigarettes      Quit date: 12/11/2008      Years since quitting: 12.4   Smokeless tobacco: Never  Vaping Use   Vaping Use: Never used  Substance Use  Topics   Alcohol use: No      Alcohol/week: 0.0 standard drinks   Drug use: No         Family History  Problem Relation Age of Onset   Hypertension Mother     Stroke Mother     Alcohol abuse Father     Cancer Father          lung CA   Heart disease Father          CHF   Cancer Sister          breast CA   Heart disease Sister          CHF from chemotx also has defib   Breast cancer Sister     Colon cancer Neg Hx     Esophageal cancer Neg Hx     Rectal cancer Neg Hx     Stomach cancer Neg Hx           Allergies  Allergen Reactions   Codeine Nausea And Vomiting      REACTION: Nausea and vomiting Pt has tolerated vicodin & percocet in the past   Lipitor [Atorvastatin]        Muscle pain    Wellbutrin [Bupropion]            Current Outpatient Medications on File Prior to Visit  Medication Sig Dispense Refill   aspirin 81 MG tablet Take 81 mg by mouth daily.       busPIRone (BUSPAR) 30 MG tablet TAKE 1/2 TO 1 TABLET (15-30 MG TOTAL) BYMOUTH 3 TIMES DAILY AS DIRECTED 270 tablet 2   lisinopril-hydrochlorothiazide (ZESTORETIC) 10-12.5 MG tablet TAKE ONE HALF TABLET BY MOUTH DAILY 45 tablet 0   meloxicam (MOBIC) 15 MG tablet Take 1 tablet (15 mg total) by mouth daily as needed for pain. With a meal 30 tablet 0   metoprolol succinate (TOPROL-XL) 50 MG 24 hr tablet TAKE 1 TABLET BY MOUTH ONCE DAILY *TAKE WITH OR IMMEDIATELY FOLLOWING A MEAL* 90 tablet 3   PARoxetine (PAXIL) 40 MG tablet Take 1.5 tablets (60 mg total) by mouth daily. 135 tablet 3   rosuvastatin (CRESTOR) 10 MG tablet Take 1 tablet (10 mg total) by mouth  daily. 90 tablet 3   solifenacin (VESICARE) 10 MG tablet Take 1 tablet (10 mg total) by mouth daily as needed. 90 tablet 3   zolpidem (AMBIEN CR) 12.5 MG CR tablet TAKE 1 TABLET BY MOUTH ONCE DAILY AT BEDTIME AS NEEDED FOR SLEEP 90 tablet 0    No current facility-administered medications on file prior to visit.    Review of Systems  Constitutional:  Negative for  chills, fever and malaise/fatigue.  HENT:  Negative for congestion, ear pain, sinus pain and sore throat.   Eyes:  Negative for blurred vision, discharge and redness.  Respiratory:  Negative for cough, shortness of breath and stridor.   Cardiovascular:  Negative for chest pain, palpitations and leg swelling.  Gastrointestinal:  Positive for diarrhea, nausea and vomiting. Negative for abdominal pain, blood in stool, constipation, heartburn and melena.  Musculoskeletal:  Negative for myalgias.  Skin:  Negative for rash.  Neurological:  Negative for dizziness and headaches.  Psychiatric/Behavioral:  The patient is nervous/anxious.      Observations/Objective: Pt sounds well In no distress Not hoarse /nl speech Mildly anxious  Candidly discusses symptoms and stressors  No cough or sob with speech  Nl cognition/good historian   Assessment and Plan:   Problem List Items Addressed This Visit              Respiratory    Smokers' cough ([place])      Needs refill of hycodan and inhaler today  No significant change  Smoked in the past            Digestive    Nausea vomiting and diarrhea - Primary      This occurred very early this am and has already resolved  Thinks it was either rxn to the fish she ate last night (husb is fine) or from anxiety  No s/s of dehydration  No fever or abd pain  Will watch closely  Adv she eat bland and drink fluids Update if not starting to improve in a week or if worsening  ER precautions reviewed             Other    Depression with anxiety      Has occ more anxious days or nights but overall buspar helps tremendously  Encouraged good self care and some exercise if tolerated          Follow Up Instructions: Drink fluids Eat bland If GI symptoms return please let us know  If severe go to the ER   I sent in cough syrup and inhaler    Follow up for annual exam in march or later    I discussed the assessment and treatment plan with the  patient. The patient was provided an opportunity to ask questions and all were answered. The patient agreed with the plan and demonstrated an understanding of the instructions.   The patient was advised to call back or seek an in-person evaluation if the symptoms worsen or if the condition fails to improve as anticipated.   I provided 17 minutes of non-face-to-face time during this encounter.     Loura Pardon, MD      Deleted by: Larayne Baxley, Wynelle Fanny, MD at 05/04/2021 12:59 PM

## 2021-05-04 NOTE — Assessment & Plan Note (Signed)
This occurred very early this am and has already resolved  Thinks it was either rxn to the fish she ate last night (husb is fine) or from anxiety  No s/s of dehydration  No fever or abd pain  Will watch closely  Adv she eat bland and drink fluids Update if not starting to improve in a week or if worsening  ER precautions reviewed

## 2021-05-04 NOTE — Assessment & Plan Note (Signed)
Needs refill of hycodan and inhaler today  No significant change  Smoked in the past

## 2021-05-11 ENCOUNTER — Encounter: Payer: Self-pay | Admitting: Family Medicine

## 2021-05-11 ENCOUNTER — Encounter: Payer: Self-pay | Admitting: Internal Medicine

## 2021-05-11 DIAGNOSIS — L814 Other melanin hyperpigmentation: Secondary | ICD-10-CM | POA: Diagnosis not present

## 2021-05-11 DIAGNOSIS — L821 Other seborrheic keratosis: Secondary | ICD-10-CM | POA: Diagnosis not present

## 2021-05-11 DIAGNOSIS — D485 Neoplasm of uncertain behavior of skin: Secondary | ICD-10-CM | POA: Diagnosis not present

## 2021-05-11 DIAGNOSIS — I788 Other diseases of capillaries: Secondary | ICD-10-CM | POA: Diagnosis not present

## 2021-05-11 DIAGNOSIS — D1801 Hemangioma of skin and subcutaneous tissue: Secondary | ICD-10-CM | POA: Diagnosis not present

## 2021-05-11 DIAGNOSIS — L820 Inflamed seborrheic keratosis: Secondary | ICD-10-CM | POA: Diagnosis not present

## 2021-05-11 DIAGNOSIS — D692 Other nonthrombocytopenic purpura: Secondary | ICD-10-CM | POA: Diagnosis not present

## 2021-05-11 DIAGNOSIS — L570 Actinic keratosis: Secondary | ICD-10-CM | POA: Diagnosis not present

## 2021-05-13 NOTE — Progress Notes (Signed)
Subjective:   Anita Harrell is a 71 y.o. female who presents for Medicare Annual (Subsequent) preventive examination.  I connected with Anita Harrell  today by telephone and verified that I am speaking with the correct person using two identifiers. Location patient: home Location provider: work Persons participating in the virtual visit: patient, Marine scientist.    I discussed the limitations, risks, security and privacy concerns of performing an evaluation and management service by telephone and the availability of in person appointments. I also discussed with the patient that there may be a patient responsible charge related to this service. The patient expressed understanding and verbally consented to this telephonic visit.    Interactive audio and video telecommunications were attempted between this provider and patient, however failed, due to patient having technical difficulties OR patient did not have access to video capability.  We continued and completed visit with audio only.  Some vital signs may be absent or patient reported.   Time Spent with patient on telephone encounter: 20 minutes  Review of Systems     Cardiac Risk Factors include: advanced age (>24men, >54 women);hypertension;dyslipidemia     Objective:    Today's Vitals   05/16/21 1313  Weight: 198 lb (89.8 kg)  Height: 5\' 3"  (1.6 m)   Body mass index is 35.07 kg/m.  Advanced Directives 05/16/2021 05/14/2020 01/29/2020 12/15/2016 11/12/2015 08/30/2015 09/24/2012  Does Patient Have a Medical Advance Directive? Yes Yes No Yes Yes Yes Patient has advance directive, copy not in chart  Type of Advance Directive Living will [place];Living will - Living will Living will Living will -  Does patient want to make changes to medical advance directive? Yes (MAU/Ambulatory/Procedural Areas - Information given) - - - - No - Patient declined -  Copy of [place] in Chart? - No - copy requested - -  - No - copy requested Copy requested from family  Would patient like information on creating a medical advance directive? - - No - Patient declined - - - -  Pre-existing out of facility DNR order (yellow form or pink MOST form) - - - - - - No    Current Medications (verified) Outpatient Encounter Medications as of 05/16/2021  Medication Sig   albuterol (VENTOLIN HFA) 108 (90 Base) MCG/ACT inhaler INHALE 2 PUFFS INTO THE LUNGS EVERY 6 HOURS AS NEEDED FOR COUGH.   aspirin 81 MG tablet Take 81 mg by mouth daily.   busPIRone (BUSPAR) 30 MG tablet TAKE 1/2 TO 1 TABLET (15-30 MG TOTAL) BYMOUTH 3 TIMES DAILY AS DIRECTED   HYDROcodone bit-homatropine (HYCODAN) 5-1.5 MG/5ML syrup TAKE FIVE MLS (1 TEASPOONFUL) BY MOUTH AS NEEDED   lisinopril-hydrochlorothiazide (ZESTORETIC) 10-12.5 MG tablet TAKE ONE HALF TABLET BY MOUTH DAILY   meloxicam (MOBIC) 15 MG tablet Take 1 tablet (15 mg total) by mouth daily as needed for pain. With a meal   metoprolol succinate (TOPROL-XL) 50 MG 24 hr tablet TAKE 1 TABLET BY MOUTH ONCE DAILY *TAKE WITH OR IMMEDIATELY FOLLOWING A MEAL*   PARoxetine (PAXIL) 40 MG tablet Take 1.5 tablets (60 mg total) by mouth daily.   rosuvastatin (CRESTOR) 10 MG tablet Take 1 tablet (10 mg total) by mouth daily.   solifenacin (VESICARE) 10 MG tablet Take 1 tablet (10 mg total) by mouth daily as needed.   zolpidem (AMBIEN CR) 12.5 MG CR tablet TAKE 1 TABLET BY MOUTH ONCE DAILY AT BEDTIME AS NEEDED FOR SLEEP   No facility-administered encounter medications on  file as of 05/16/2021.    Allergies (verified) Codeine, Lipitor [atorvastatin], and Wellbutrin [bupropion]   History: Past Medical History:  Diagnosis Date   Allergy    allergic rhinitis   Anxiety    Back pain    Chest pain    Depression    Epigastric pain    Goiter    History of tobacco abuse    Hyperglycemia    mild   Hyperlipidemia    Hypertension    Insomnia    Hx of   Labile blood pressure    Panic disorder     History of   Personal history of colonic polyps 06/09/2004   SVD (spontaneous vaginal delivery)    x 2   Past Surgical History:  Procedure Laterality Date   ABDOMINAL HYSTERECTOMY N/A 09/24/2012   Procedure: HYSTERECTOMY ABDOMINAL;  Surgeon: Gus Height, MD;  Location: [facility];  Service: Gynecology;  Laterality: N/A;   BREAST BIOPSY     COLONOSCOPY     COLONOSCOPY  2017   ELBOW SURGERY  2004   EYE SURGERY     bilateral lasik   SALPINGOOPHORECTOMY Bilateral 09/24/2012   Procedure: SALPINGO OOPHORECTOMY;  Surgeon: Gus Height, MD;  Location: [facility];  Service: Gynecology;  Laterality: Bilateral;   THYROID SURGERY     B9 massess   WART FULGURATION N/A 09/24/2012   Procedure: FULGURATION VAGINAL WART;  Surgeon: Gus Height, MD;  Location: [facility];  Service: Gynecology;  Laterality: N/A;   WISDOM TOOTH EXTRACTION     Family History  Problem Relation Age of Onset   Hypertension Mother    Stroke Mother    Alcohol abuse Father    Cancer Father        lung CA   Heart disease Father        CHF   Cancer Sister        breast CA   Heart disease Sister        CHF from chemotx also has defib   Breast cancer Sister    Colon cancer Neg Hx    Esophageal cancer Neg Hx    Rectal cancer Neg Hx    Stomach cancer Neg Hx    Social History   Socioeconomic History   Marital status: Married    Spouse name: Not on file   Number of children: Not on file   Years of education: Not on file   Highest education level: Not on file  Occupational History   Not on file  Tobacco Use   Smoking status: Former    Packs/day: 1.00    Years: 20.00    Pack years: 20.00    Types: Cigarettes    Quit date: 12/11/2008    Years since quitting: 12.4   Smokeless tobacco: Never  Vaping Use   Vaping Use: Never used  Substance and Sexual Activity   Alcohol use: No    Alcohol/week: 0.0 standard drinks   Drug use: No   Sexual activity: Yes    Birth control/protection: Post-menopausal  Other Topics Concern   Not on  file  Social History Narrative   Not on file   Social Determinants of Health   Financial Resource Strain: Low Risk    Difficulty of Paying Living Expenses: Not hard at all  Food Insecurity: No Food Insecurity   Worried About Charity fundraiser in the Last Year: Never true   Ran Out of Food in the Last Year: Never true  Transportation Needs: No Transportation  Needs   Lack of Transportation (Medical): No   Lack of Transportation (Non-Medical): No  Physical Activity: Inactive   Days of Exercise per Week: 0 days   Minutes of Exercise per Session: 0 min  Stress: No Stress Concern Present   Feeling of Stress : Not at all  Social Connections: Moderately Isolated   Frequency of Communication with Friends and Family: More than three times a week   Frequency of Social Gatherings with Friends and Family: Never   Attends Religious Services: Never   Marine scientist or Organizations: No   Attends Music therapist: Never   Marital Status: Married    Tobacco Counseling Counseling given: Not Answered   Clinical Intake:  Pre-visit preparation completed: Yes  Pain : No/denies pain     BMI - recorded: 35.07 Nutritional Status: BMI > 30  Obese Nutritional Risks: None Diabetes: No  How often do you need to have someone help you when you read instructions, pamphlets, or other written materials from your doctor or pharmacy?: 1 - Never  Diabetic? No  Interpreter Needed?: No  Information entered by :: Orrin Brigham LPN   Activities of Daily Living In your present state of health, do you have any difficulty performing the following activities: 05/16/2021  Hearing? N  Vision? N  Difficulty concentrating or making decisions? N  Walking or climbing stairs? N  Dressing or bathing? N  Doing errands, shopping? N  Preparing Food and eating ? N  Using the Toilet? N  In the past six months, have you accidently leaked urine? N  Do you have problems with loss of bowel  control? N  Managing your Medications? N  Managing your Finances? N  Housekeeping or managing your Housekeeping? N  Some recent data might be hidden    Patient Care Team: Tower, Wynelle Fanny, MD as PCP - General Vania Rea, MD as Consulting Physician (Obstetrics and Gynecology) Debbora Dus, [facility] as Pharmacist (Pharmacist)  Indicate any recent Medical Services you may have received from other than [facility] providers in the past year (date may be approximate).     Assessment:   This is a routine wellness examination for Rukia.  Hearing/Vision screen Hearing Screening - Comments:: No issues  Vision Screening - Comments:: Last exam over a year ago  Dietary issues and exercise activities discussed: Current Exercise Habits: The patient does not participate in regular exercise at present   Goals Addressed             This Visit's Progress    Patient Stated       Would like to maintain current routine       Depression Screen PHQ 2/9 Scores 05/16/2021 05/14/2020 10/17/2019 01/16/2019 12/25/2017 12/15/2016 08/30/2015  PHQ - 2 Score 1 4 2  0 0 2 0  PHQ- 9 Score - 6 10 - - 3 -    Fall Risk Fall Risk  05/16/2021 05/14/2020 01/16/2019 10/14/2018 02/15/2017  Falls in the past year? 1 1 0 0 No  Comment - tripped and fell - Emmi Telephone Survey: data to providers prior to load Emmi Telephone Survey: data to providers prior to load  Number falls in past yr: 0 0 - - -  Injury with Fall? 0 0 - - -  Risk for fall due to : Other (Comment) Medication side effect - - -  Risk for fall due to: Comment tripped - - - -  Follow up Falls prevention discussed Falls evaluation completed;Falls prevention discussed - - -  FALL RISK PREVENTION PERTAINING TO THE HOME:  Any stairs in or around the home? Yes  If so, are there any without handrails? No  Home free of loose throw rugs in walkways, pet beds, electrical cords, etc? Yes  Adequate lighting in your home to reduce risk of falls? Yes   ASSISTIVE DEVICES  UTILIZED TO PREVENT FALLS:  Life alert? No  Use of a cane, walker or w/c? No  Grab bars in the bathroom? Yes  Shower chair or bench in shower? No  Elevated toilet seat or a handicapped toilet? No   TIMED UP AND GO:  Was the test performed? No .   Cognitive Function: Normal cognitive status assessed by  this Nurse Health Advisor. No abnormalities found.   MMSE - Mini Mental State Exam 05/14/2020 12/15/2016 08/30/2015  Orientation to time 5 5 5   Orientation to Place 5 5 5   Registration 3 3 3   Attention/ Calculation 5 0 0  Recall 3 3 3   Language- name 2 objects - 0 0  Language- repeat 1 1 1   Language- follow 3 step command - 3 3  Language- read & follow direction - 0 0  Write a sentence - 0 0  Copy design - 0 0  Total score - 20 20        Immunizations Immunization History  Administered Date(s) Administered   Fluad Quad(high Dose 65+) 01/16/2019   Influenza Split 03/27/2011, 01/23/2012   Influenza,inj,Quad PF,6+ Mos 11/26/2012, 11/10/2013, 11/24/2016, 12/25/2017   PFIZER(Purple Top)SARS-COV-2 Vaccination 10/20/2019, 11/10/2019   Pneumococcal Conjugate-13 08/21/2014   Pneumococcal Polysaccharide-23 05/19/2008, 08/30/2015   Td 03/13/2001   Tdap 01/23/2012   Zoster, Live 01/07/2013    TDAP status: Up to date  Flu Vaccine status: Due, Education has been provided regarding the importance of this vaccine. Advised may receive this vaccine at local pharmacy or Health Dept. Aware to provide a copy of the vaccination record if obtained from local pharmacy or Health Dept. Verbalized acceptance and understanding.  Pneumococcal vaccine status: Up to date  Covid-19 vaccine status: Information provided on how to obtain vaccines.   Qualifies for Shingles Vaccine? Yes   Zostavax completed Yes   Shingrix Completed?: No.    Education has been provided regarding the importance of this vaccine. Patient has been advised to call insurance company to determine out of pocket expense if they  have not yet received this vaccine. Advised may also receive vaccine at local pharmacy or Health Dept. Verbalized acceptance and understanding.  Screening Tests Health Maintenance  Topic Date Due   Zoster Vaccines- Shingrix (1 of 2) Never done   MAMMOGRAM  05/29/2019   COVID-19 Vaccine (3 - Booster for Pfizer series) 01/05/2020   INFLUENZA VACCINE  10/11/2020   COLONOSCOPY (Pts 45-1yrs Insurance coverage will need to be confirmed)  11/11/2020   DEXA SCAN  12/15/2048 (Originally 03/04/2014)   TETANUS/TDAP  01/22/2022   Pneumonia Vaccine 70+ Years old  Completed   Hepatitis C Screening  Completed   HPV VACCINES  Aged Out    Health Maintenance  Health Maintenance Due  Topic Date Due   Zoster Vaccines- Shingrix (1 of 2) Never done   MAMMOGRAM  05/29/2019   COVID-19 Vaccine (3 - Booster for [place] series) 01/05/2020   INFLUENZA VACCINE  10/11/2020   COLONOSCOPY (Pts 45-28yrs Insurance coverage will need to be confirmed)  11/11/2020    Colorectal cancer screening: Type of screening: Colonoscopy. Completed 11/12/15. Repeat every 5 years  Mammogram status: due, patient plans  on discussing with PCP when decided   Bone density status: due,  patient plans on discussing with PCP when decided   Lung Cancer Screening: (Low Dose CT Chest recommended if Age 17-80 years, 30 pack-year currently smoking OR have quit w/in 15years.) does not qualify.     Additional Screening:  Hepatitis C Screening: does qualify; Completed 08/30/15  Vision Screening: Recommended annual ophthalmology exams for early detection of glaucoma and other disorders of the eye. Is the patient up to date with their annual eye exam?  No  Who is the provider or what is the name of the office in which the patient attends annual eye exams? Provider information unavailable   Dental Screening: Recommended annual dental exams for proper oral hygiene  Community Resource Referral / Chronic Care Management: CRR required this  visit?  No   CCM required this visit?  No      Harrell:     I have personally reviewed and noted the following in the patients chart:   Medical and social history Use of alcohol, tobacco or illicit drugs  Current medications and supplements including opioid prescriptions.  Functional ability and status Nutritional status Physical activity Advanced directives List of other physicians Hospitalizations, surgeries, and ER visits in previous 12 months Vitals Screenings to include cognitive, depression, and falls Referrals and appointments  In addition, I have reviewed and discussed with patient certain preventive protocols, quality metrics, and best practice recommendations. A written personalized care Harrell for preventive services as well as general preventive health recommendations were provided to patient.   Due to this being a telephonic visit, the after visit summary with patients personalized Harrell was offered to patient via mail or my-chart. Patient would like to access on my-chart.     Loma Messing, LPN   11/18/9209   Nurse Health Advisor  Nurse Notes: none

## 2021-05-16 ENCOUNTER — Ambulatory Visit (INDEPENDENT_AMBULATORY_CARE_PROVIDER_SITE_OTHER): Payer: Medicare Other

## 2021-05-16 VITALS — Ht 63.0 in | Wt 198.0 lb

## 2021-05-16 DIAGNOSIS — Z1211 Encounter for screening for malignant neoplasm of colon: Secondary | ICD-10-CM | POA: Diagnosis not present

## 2021-05-16 DIAGNOSIS — Z0000 Encounter for general adult medical examination without abnormal findings: Secondary | ICD-10-CM

## 2021-05-16 NOTE — Patient Instructions (Signed)
[name] , Thank you for taking time to complete your Medicare Wellness Visit. I appreciate your ongoing commitment to your health goals. Please review the following plan we discussed and let me know if I can assist you in the future.   Screening recommendations/referrals: Colonoscopy: due 11/11/20, ordered placed today someone will call to schedule an appointment Mammogram: due, discuss with PCP if you change your mind Bone Density: due, discuss with PCP if you change your mind Recommended yearly ophthalmology/optometry visit for glaucoma screening and checkup Recommended yearly dental visit for hygiene and checkup  Vaccinations: Influenza vaccine: Due-May obtain vaccine at our office or your local pharmacy. Pneumococcal vaccine: up to date Tdap vaccine: up to date, due 01/22/22 Shingles vaccine: Discuss with pharmacy   Covid-19:newest booster available at your local pharmacy   Advanced directives: Please bring a copy of Living Will and/or Healthcare Power of Attorney for your chart.   Conditions/risks identified: see problem list   Next appointment: Follow up in one year for your annual wellness visit 05/18/22 @ 1:15pm ,this will be a telephone visit    Preventive Care 65 Years and Older, Female Preventive care refers to lifestyle choices and visits with your health care provider that can promote health and wellness. What does preventive care include? A yearly physical exam. This is also called an annual well check. Dental exams once or twice a year. Routine eye exams. Ask your health care provider how often you should have your eyes checked. Personal lifestyle choices, including: Daily care of your teeth and gums. Regular physical activity. Eating a healthy diet. Avoiding tobacco and drug use. Limiting alcohol use. Practicing safe sex. Taking low-dose aspirin every day. Taking vitamin and mineral supplements as recommended by your health care provider. What happens during an  annual well check? The services and screenings done by your health care provider during your annual well check will depend on your age, overall health, lifestyle risk factors, and family history of disease. Counseling  Your health care provider may ask you questions about your: Alcohol use. Tobacco use. Drug use. Emotional well-being. Home and relationship well-being. Sexual activity. Eating habits. History of falls. Memory and ability to understand (cognition). Work and work Statistician. Reproductive health. Screening  You may have the following tests or measurements: Height, weight, and BMI. Blood pressure. Lipid and cholesterol levels. These may be checked every 5 years, or more frequently if you are over 47 years old. Skin check. Lung cancer screening. You may have this screening every year starting at age 70 if you have a 30-pack-year history of smoking and currently smoke or have quit within the past 15 years. Fecal occult blood test (FOBT) of the stool. You may have this test every year starting at age 6. Flexible sigmoidoscopy or colonoscopy. You may have a sigmoidoscopy every 5 years or a colonoscopy every 10 years starting at age 73. Hepatitis C blood test. Hepatitis B blood test. Sexually transmitted disease (STD) testing. Diabetes screening. This is done by checking your blood sugar (glucose) after you have not eaten for a while (fasting). You may have this done every 1-3 years. Bone density scan. This is done to screen for osteoporosis. You may have this done starting at age 23. Mammogram. This may be done every 1-2 years. Talk to your health care provider about how often you should have regular mammograms. Talk with your health care provider about your test results, treatment options, and if necessary, the need for more tests. Vaccines  Your  health care provider may recommend certain vaccines, such as: Influenza vaccine. This is recommended every year. Tetanus,  diphtheria, and acellular pertussis (Tdap, Td) vaccine. You may need a Td booster every 10 years. Zoster vaccine. You may need this after age 36. Pneumococcal 13-valent conjugate (PCV13) vaccine. One dose is recommended after age 28. Pneumococcal polysaccharide (PPSV23) vaccine. One dose is recommended after age 86. Talk to your health care provider about which screenings and vaccines you need and how often you need them. This information is not intended to replace advice given to you by your health care provider. Make sure you discuss any questions you have with your health care provider. Document Released: 03/26/2015 Document Revised: 11/17/2015 Document Reviewed: 12/29/2014 Elsevier Interactive Patient Education  2017 [place] Prevention in the Home Falls can cause injuries. They can happen to people of all ages. There are many things you can do to make your home safe and to help prevent falls. What can I do on the outside of my home? Regularly fix the edges of walkways and driveways and fix any cracks. Remove anything that might make you trip as you walk through a door, such as a raised step or threshold. Trim any bushes or trees on the path to your home. Use bright outdoor lighting. Clear any walking paths of anything that might make someone trip, such as rocks or tools. Regularly check to see if handrails are loose or broken. Make sure that both sides of any steps have handrails. Any raised decks and porches should have guardrails on the edges. Have any leaves, snow, or ice cleared regularly. Use sand or salt on walking paths during winter. Clean up any spills in your garage right away. This includes oil or grease spills. What can I do in the bathroom? Use night lights. Install grab bars by the toilet and in the tub and shower. Do not use towel bars as grab bars. Use non-skid mats or decals in the tub or shower. If you need to sit down in the shower, use a plastic,  non-slip stool. Keep the floor dry. Clean up any water that spills on the floor as soon as it happens. Remove soap buildup in the tub or shower regularly. Attach bath mats securely with double-sided non-slip rug tape. Do not have throw rugs and other things on the floor that can make you trip. What can I do in the bedroom? Use night lights. Make sure that you have a light by your bed that is easy to reach. Do not use any sheets or blankets that are too big for your bed. They should not hang down onto the floor. Have a firm chair that has side arms. You can use this for support while you get dressed. Do not have throw rugs and other things on the floor that can make you trip. What can I do in the kitchen? Clean up any spills right away. Avoid walking on wet floors. Keep items that you use a lot in easy-to-reach places. If you need to reach something above you, use a strong step stool that has a grab bar. Keep electrical cords out of the way. Do not use floor polish or wax that makes floors slippery. If you must use wax, use non-skid floor wax. Do not have throw rugs and other things on the floor that can make you trip. What can I do with my stairs? Do not leave any items on the stairs. Make sure that there are handrails  on both sides of the stairs and use them. Fix handrails that are broken or loose. Make sure that handrails are as long as the stairways. Check any carpeting to make sure that it is firmly attached to the stairs. Fix any carpet that is loose or worn. Avoid having throw rugs at the top or bottom of the stairs. If you do have throw rugs, attach them to the floor with carpet tape. Make sure that you have a light switch at the top of the stairs and the bottom of the stairs. If you do not have them, ask someone to add them for you. What else can I do to help prevent falls? Wear shoes that: Do not have high heels. Have rubber bottoms. Are comfortable and fit you well. Are closed  at the toe. Do not wear sandals. If you use a stepladder: Make sure that it is fully opened. Do not climb a closed stepladder. Make sure that both sides of the stepladder are locked into place. Ask someone to hold it for you, if possible. Clearly mark and make sure that you can see: Any grab bars or handrails. First and last steps. Where the edge of each step is. Use tools that help you move around (mobility aids) if they are needed. These include: Canes. Walkers. Scooters. Crutches. Turn on the lights when you go into a dark area. Replace any light bulbs as soon as they burn out. Set up your furniture so you have a clear path. Avoid moving your furniture around. If any of your floors are uneven, fix them. If there are any pets around you, be aware of where they are. Review your medicines with your doctor. Some medicines can make you feel dizzy. This can increase your chance of falling. Ask your doctor what other things that you can do to help prevent falls. This information is not intended to replace advice given to you by your health care provider. Make sure you discuss any questions you have with your health care provider. Document Released: 12/24/2008 Document Revised: 08/05/2015 Document Reviewed: 04/03/2014 Elsevier Interactive Patient Education  2017 [employer].

## 2021-06-07 ENCOUNTER — Encounter: Payer: Self-pay | Admitting: Family Medicine

## 2021-06-07 ENCOUNTER — Ambulatory Visit (INDEPENDENT_AMBULATORY_CARE_PROVIDER_SITE_OTHER): Payer: Medicare Other | Admitting: Family Medicine

## 2021-06-07 ENCOUNTER — Other Ambulatory Visit: Payer: Self-pay

## 2021-06-07 VITALS — BP 124/70 | HR 75 | Temp 98.2°F | Ht 62.75 in | Wt 200.0 lb

## 2021-06-07 DIAGNOSIS — E7800 Pure hypercholesterolemia, unspecified: Secondary | ICD-10-CM | POA: Diagnosis not present

## 2021-06-07 DIAGNOSIS — N3281 Overactive bladder: Secondary | ICD-10-CM | POA: Diagnosis not present

## 2021-06-07 DIAGNOSIS — F418 Other specified anxiety disorders: Secondary | ICD-10-CM

## 2021-06-07 DIAGNOSIS — J410 Simple chronic bronchitis: Secondary | ICD-10-CM

## 2021-06-07 DIAGNOSIS — E2839 Other primary ovarian failure: Secondary | ICD-10-CM | POA: Diagnosis not present

## 2021-06-07 DIAGNOSIS — R7303 Prediabetes: Secondary | ICD-10-CM

## 2021-06-07 DIAGNOSIS — E669 Obesity, unspecified: Secondary | ICD-10-CM

## 2021-06-07 DIAGNOSIS — I10 Essential (primary) hypertension: Secondary | ICD-10-CM

## 2021-06-07 MED ORDER — DULOXETINE HCL 30 MG PO CPEP: 30.0000 mg | ORAL_CAPSULE | Freq: Every day | ORAL | 1 refills | Status: DC

## 2021-06-07 NOTE — Progress Notes (Signed)
? ?Subjective:  ? ? Patient ID: Anita Harrell, female    DOB: 1950-06-03, 71 y.o.   MRN: [id number] ? ?This visit occurred during the SARS-CoV-2 public health emergency.  Safety protocols were in place, including screening questions prior to the visit, additional usage of staff PPE, and extensive cleaning of exam room while observing appropriate contact time as indicated for disinfecting solutions.  ? ?HPI ?Pt presents for annual f/u of chronic health problems  ? ?Wt Readings from Last 3 Encounters:  ?06/07/21 200 lb (90.7 kg)  ?05/16/21 198 lb (89.8 kg)  ?05/04/21 198 lb (89.8 kg)  ? ?35.71 kg/m? ? ?Colonoscopy due 11/11/20 (last 2017)  ?Past polyps  ?Referral was done  ?Will plan this  ? ? ?Mammogram  05/2017 =wants to get  ?Sister had breast cancer ?Self breast exam : no lumps  ? ?Had dermatology appt / biopsy was b9  ? ? ?Dexa : ready to schedule ?Falls: none ?Fractures : none  ?Plans to start silver sneakers program  ? ?Zoster status  ?Had covid vaccines ?Missed last flu shot  ?[place] pna vaccines  ? ? ?HTN ?bp is stable today  ?No cp or palpitations or headaches or edema  ?No side effects to medicines  ?BP Readings from Last 3 Encounters:  ?06/07/21 124/70  ?11/04/20 110/70  ?09/08/20 110/78  ?   ?Lisinopril hct 5-6.25 mg daily  ?Metoprolol xl 50 mg daily  ? ?Pulse Readings from Last 3 Encounters:  ?06/07/21 75  ?11/04/20 83  ?09/08/20 87  ? ? ? ?Hyperlipidemia ?Lab Results  ?Component Value Date  ? CHOL 108 05/14/2020  ? HDL 40.60 05/14/2020  ? LDLCALC 39 05/14/2020  ? LDLDIRECT 73.0 08/21/2014  ? TRIG 143.0 05/14/2020  ? CHOLHDL 3 05/14/2020  ? ?Due for labs  ? ?Crestor 10 mg daily  ? ?Prediabetes ?Lab Results  ?Component Value Date  ? HGBA1C 6.1 05/14/2020  ? ?Due for labs  ? ?Mood -worse depression , first thing in the am dreads getting started with the day  ?Improves with buspar  ?Talks to husband and a couple friends  ?Depression /anxiety  ?Paxil 40 mg 1/1/2 pill daily ?Buspar 30 mg =took tid  ?Feels both  down and anxious  ?Not suicidal  ?Has not been suicidal   ?Lack of motivation  ? ?Took a friend's valium and it helped  ? ?Stress : none really  ?Husband is doing well  ? ? ?Patient Active Problem List  ? Diagnosis Date Noted  ? Estrogen deficiency 06/07/2021  ? Fatigue 06/21/2020  ? Overactive bladder 10/17/2019  ? Medicare annual wellness visit, subsequent 01/16/2019  ? Thyroid nodule 05/01/2018  ? Electronic cigarette use 04/03/2018  ? Obesity (BMI 30-39.9) 04/03/2018  ? Lung mass 02/01/2018  ? Abnormal chest x-ray 01/29/2018  ? Smokers' cough ([place]) 01/01/2018  ? Tachycardia 11/01/2015  ? Microscopic hematuria 08/13/2012  ? Routine general medical examination at a health care facility 07/22/2012  ? Leukocytosis 03/30/2010  ? Prediabetes 02/21/2008  ? HYPERTENSION, BENIGN ESSENTIAL 01/22/2008  ? ALLERGIC RHINITIS 07/25/2007  ? BACK PAIN 06/26/2007  ? Personal history of goiter 07/27/2006  ? Hyperlipidemia 07/27/2006  ? PANIC DISORDER 07/27/2006  ? Former smoker 07/27/2006  ? Depression with anxiety 07/27/2006  ? Insomnia 07/27/2006  ? Personal history of colonic polyps 06/09/2004  ? ?Past Medical History:  ?Diagnosis Date  ? Allergy   ? allergic rhinitis  ? Anxiety   ? Back pain   ? Chest pain   ?  Depression   ? Epigastric pain   ? Goiter   ? History of tobacco abuse   ? Hyperglycemia   ? mild  ? Hyperlipidemia   ? Hypertension   ? Insomnia   ? Hx of  ? Labile blood pressure   ? Panic disorder   ? History of  ? Personal history of colonic polyps 06/09/2004  ? SVD (spontaneous vaginal delivery)   ? x 2  ? ?Past Surgical History:  ?Procedure Laterality Date  ? ABDOMINAL HYSTERECTOMY N/A 09/24/2012  ? Procedure: HYSTERECTOMY ABDOMINAL;  Surgeon: Gus Height, MD;  Location: [facility];  Service: Gynecology;  Laterality: N/A;  ? BREAST BIOPSY    ? COLONOSCOPY    ? COLONOSCOPY  2017  ? ELBOW SURGERY  2004  ? EYE SURGERY    ? bilateral lasik  ? SALPINGOOPHORECTOMY Bilateral 09/24/2012  ? Procedure: SALPINGO OOPHORECTOMY;   Surgeon: Gus Height, MD;  Location: [facility];  Service: Gynecology;  Laterality: Bilateral;  ? THYROID SURGERY    ? B9 massess  ? WART FULGURATION N/A 09/24/2012  ? Procedure: FULGURATION VAGINAL WART;  Surgeon: Gus Height, MD;  Location: [facility];  Service: Gynecology;  Laterality: N/A;  ? WISDOM TOOTH EXTRACTION    ? ?Social History  ? ?Tobacco Use  ? Smoking status: Former  ?  Packs/day: 1.00  ?  Years: 20.00  ?  Pack years: 20.00  ?  Types: Cigarettes  ?  Quit date: 12/11/2008  ?  Years since quitting: 12.4  ? Smokeless tobacco: Never  ?Vaping Use  ? Vaping Use: Never used  ?Substance Use Topics  ? Alcohol use: No  ?  Alcohol/week: 0.0 standard drinks  ? Drug use: No  ? ?Family History  ?Problem Relation Age of Onset  ? Hypertension Mother   ? Stroke Mother   ? Alcohol abuse Father   ? Cancer Father   ?     lung CA  ? Heart disease Father   ?     CHF  ? Cancer Sister   ?     breast CA  ? Heart disease Sister   ?     CHF from chemotx also has defib  ? Breast cancer Sister   ? Colon cancer Neg Hx   ? Esophageal cancer Neg Hx   ? Rectal cancer Neg Hx   ? Stomach cancer Neg Hx   ? ?Allergies  ?Allergen Reactions  ? Codeine Nausea And Vomiting  ?  REACTION: Nausea and vomiting ?Pt has tolerated vicodin & percocet in the past  ? Lipitor [Atorvastatin]   ?  Muscle pain   ? Wellbutrin [Bupropion]   ? ?Current Outpatient Medications on File Prior to Visit  ?Medication Sig Dispense Refill  ? albuterol (VENTOLIN HFA) 108 (90 Base) MCG/ACT inhaler INHALE 2 PUFFS INTO THE LUNGS EVERY 6 HOURS AS NEEDED FOR COUGH. 18 g 3  ? busPIRone (BUSPAR) 30 MG tablet TAKE 1/2 TO 1 TABLET (15-30 MG TOTAL) BYMOUTH 3 TIMES DAILY AS DIRECTED 270 tablet 2  ? HYDROcodone bit-homatropine (HYCODAN) 5-1.5 MG/5ML syrup TAKE FIVE MLS (1 TEASPOONFUL) BY MOUTH AS NEEDED 60 mL 0  ? lisinopril-hydrochlorothiazide (ZESTORETIC) 10-12.5 MG tablet TAKE ONE HALF TABLET BY MOUTH DAILY 45 tablet 0  ? meloxicam (MOBIC) 15 MG tablet Take 1 tablet (15 mg total) by  mouth daily as needed for pain. With a meal 30 tablet 0  ? metoprolol succinate (TOPROL-XL) 50 MG 24 hr tablet TAKE 1 TABLET BY MOUTH ONCE  DAILY *TAKE WITH OR IMMEDIATELY FOLLOWING A MEAL* 90 tablet 3  ? rosuvastatin (CRESTOR) 10 MG tablet Take 1 tablet (10 mg total) by mouth daily. 90 tablet 3  ? solifenacin (VESICARE) 10 MG tablet Take 1 tablet (10 mg total) by mouth daily as needed. 90 tablet 3  ? zolpidem (AMBIEN CR) 12.5 MG CR tablet TAKE 1 TABLET BY MOUTH ONCE DAILY AT BEDTIME AS NEEDED FOR SLEEP 90 tablet 0  ? ?No current facility-administered medications on file prior to visit.  ?  ? ? ?Review of Systems  ?Constitutional:  Positive for fatigue. Negative for activity change, appetite change, fever and unexpected weight change.  ?HENT:  Negative for congestion, ear pain, rhinorrhea, sinus pressure and sore throat.   ?Eyes:  Negative for pain, redness and visual disturbance.  ?Respiratory:  Negative for cough, shortness of breath and wheezing.   ?Cardiovascular:  Negative for chest pain and palpitations.  ?Gastrointestinal:  Negative for abdominal pain, blood in stool, constipation and diarrhea.  ?Endocrine: Negative for polydipsia and polyuria.  ?Genitourinary:  Negative for dysuria, frequency and urgency.  ?Musculoskeletal:  Negative for arthralgias, back pain and myalgias.  ?Skin:  Negative for pallor and rash.  ?Allergic/Immunologic: Negative for environmental allergies.  ?Neurological:  Negative for dizziness, syncope and headaches.  ?Hematological:  Negative for adenopathy. Does not bruise/bleed easily.  ?Psychiatric/Behavioral:  Positive for dysphoric mood and sleep disturbance. Negative for decreased concentration and suicidal ideas. The patient is nervous/anxious.   ? ?   ?Objective:  ? Physical Exam ?Constitutional:   ?   General: She is not in acute distress. ?   Appearance: Normal appearance. She is well-developed. She is obese. She is not ill-appearing or diaphoretic.  ?HENT:  ?   Head:  Normocephalic and atraumatic.  ?   Right Ear: Tympanic membrane, ear canal and external ear normal.  ?   Left Ear: Tympanic membrane, ear canal and external ear normal.  ?   Nose: Nose normal. No congestion.  ?   Mouth/Thr

## 2021-06-07 NOTE — Assessment & Plan Note (Signed)
Clinically stable ?Plan to continue Vesicare 10 mg daily which is helpful ?

## 2021-06-07 NOTE — Assessment & Plan Note (Signed)
bp in fair control at this time  ?BP Readings from Last 1 Encounters:  ?06/07/21 124/70  ? ?No changes needed ?Most recent labs reviewed  ?Disc lifstyle change with low sodium diet and exercise  ?Plan to continue with lisinopril HCT 10/12.5 mg 1/2 pill daily and metoprolol XL 50 mg daily ?Labs ordered ?

## 2021-06-07 NOTE — Assessment & Plan Note (Signed)
Worsened lately for and seemingly no reason ?Reviewed stressors/ coping techniques/symptoms/ support sources/ tx options and side effects in detail today ?Patient denies any suicidal ideation ?She is taking Paxil 60 mg daily for some time along with BuSpar 30 mg up to 3 times daily as needed which has been most helpful ?She is open to a medicine switch, would consider changing Paxil to Cymbalta 30 mg daily to titrate up to 60 if well-tolerated ? ?Discussed expectations of SNRI  medication including time to effectiveness and mechanism of action, also poss of side effects (early and late)- including mental fuzziness, weight or appetite change, nausea and poss of worse dep or anxiety (even suicidal thoughts)  Pt voiced understanding and will stop med and update if this occurs  ?Declines counseling  ?Will f/u here in 1-2 weeks  ?ER precautions discussed  ?

## 2021-06-07 NOTE — Assessment & Plan Note (Signed)
No clinical changes ?Patient takes Hycodan as needed ?

## 2021-06-07 NOTE — Assessment & Plan Note (Signed)
Discussed how this problem influences overall health and the risks it imposes  ?Reviewed plan for weight loss with lower calorie diet (via better food choices and also portion control or program like weight watchers) and exercise building up to or more than 30 minutes 5 days per week including some aerobic activity  ? ?Due to depression, not motivated to change lifestyle currently  ?

## 2021-06-07 NOTE — Assessment & Plan Note (Signed)
A1C ordered °disc imp of low glycemic diet and wt loss to prevent DM2  °

## 2021-06-07 NOTE — Assessment & Plan Note (Signed)
Disc goals for lipids and reasons to control them ?Rev last labs with pt ?Rev low sat fat diet in detail ?Labs ordered  ?Taking crestor 10 mg daily  ? ?

## 2021-06-07 NOTE — Patient Instructions (Addendum)
I want to replace paxil with cymbalta  ? ?On the day you decide  ?Skip paxil and start cymbalta 30 mg daily  ?If side effects or if you feel worse/suicidal then hold it and call or go to the ER ?After a week let us know how you do - goal would be to go up on the dose  ?We will schedule a follow up  ? ?Try to get 1200-1500 mg of calcium per day with at least 1000 iu of vitamin D - for bone health ? ?Try debrox solution for ear wax ? ? ?Call the breast center to set up your mammogram and bone density test  ? ? ?Please call the location of your choice from the menu below to schedule your Mammogram and/or Bone Density appointment.   ? ?[facility]  ? ?[facility]                ?      Phone:  [phone] ?[address]                               ?[place], [place] [address]                                                             ?Services: Traditional and 3D Mammogram, Bone Density  ? ?[facility]           ?      Phone: [phone] ?[address]                                                       ?[place], [place] [address]    ?Service: Bone Density ONLY  ? *this site does NOT perform mammograms ? ?[facility]                       ? Phone:  [phone] ?[address]                                  ?[place], [place] [address]                                            ?Services:  3D Mammogram and Bone Density  ? ? ?[place] ? ?[facility]   ?Phone:  [phone]   ?[place]  [place], [place] [address]                                            ?Services: 3D Mammogram and Bone Density ? ?[place] [facility])  ?Phone:  [phone]   ?[address]                        ?[place], [place] [address]                                               ?Services:  3D Mammogram and Bone Density ? ? ? ? ? ?

## 2021-06-08 LAB — COMPREHENSIVE METABOLIC PANEL
ALT: 17 U/L (ref 0–35)
AST: 18 U/L (ref 0–37)
Albumin: 4.7 g/dL (ref 3.5–5.2)
Alkaline Phosphatase: 75 U/L (ref 39–117)
BUN: 12 mg/dL (ref 6–23)
CO2: 26 mEq/L (ref 19–32)
Calcium: 10.1 mg/dL (ref 8.4–10.5)
Chloride: 102 mEq/L (ref 96–112)
Creatinine, Ser: 0.66 mg/dL (ref 0.40–1.20)
GFR: 87.73 mL/min (ref >60.00)
Glucose, Bld: 92 mg/dL (ref 70–99)
Potassium: 4.1 mEq/L (ref 3.5–5.1)
Sodium: 137 mEq/L (ref 135–145)
Total Bilirubin: 0.6 mg/dL (ref 0.2–1.2)
Total Protein: 7.4 g/dL (ref 6.0–8.3)

## 2021-06-08 LAB — HEMOGLOBIN A1C: Hgb A1c MFr Bld: 6.1 % (ref 4.6–6.5)

## 2021-06-08 LAB — LIPID PANEL
Cholesterol: 118 mg/dL (ref 0–200)
HDL: 41.3 mg/dL (ref >39.00)
LDL Cholesterol: 37 mg/dL (ref 0–99)
NonHDL: 76.7
Total CHOL/HDL Ratio: 3
Triglycerides: 197 mg/dL — ABNORMAL HIGH (ref 0.0–149.0)
VLDL: 39.4 mg/dL (ref 0.0–40.0)

## 2021-06-08 LAB — CBC WITH DIFFERENTIAL/PLATELET
Basophils Absolute: 0.1 10*3/uL (ref 0.0–0.1)
Basophils Relative: 0.6 % (ref 0.0–3.0)
Eosinophils Absolute: 0.1 10*3/uL (ref 0.0–0.7)
Eosinophils Relative: 0.7 % (ref 0.0–5.0)
HCT: 42.7 % (ref 36.0–46.0)
Hemoglobin: 14.6 g/dL (ref 12.0–15.0)
Lymphocytes Relative: 17.4 % (ref 12.0–46.0)
Lymphs Abs: 2.1 10*3/uL (ref 0.7–4.0)
MCHC: 34.1 g/dL (ref 30.0–36.0)
MCV: 87.4 fl (ref 78.0–100.0)
Monocytes Absolute: 3.6 10*3/uL — ABNORMAL HIGH (ref 0.1–1.0)
Monocytes Relative: 29.2 % — ABNORMAL HIGH (ref 3.0–12.0)
Neutro Abs: 6.3 10*3/uL (ref 1.4–7.7)
Neutrophils Relative %: 52.1 % (ref 43.0–77.0)
Platelets: 156 10*3/uL (ref 150.0–400.0)
RBC: 4.88 Mil/uL (ref 3.87–5.11)
RDW: 12.9 % (ref 11.5–15.5)
WBC: 12.2 10*3/uL — ABNORMAL HIGH (ref 4.0–10.5)

## 2021-06-08 LAB — TSH: TSH: 2.14 u[IU]/mL (ref 0.35–5.50)

## 2021-06-10 ENCOUNTER — Other Ambulatory Visit: Payer: Self-pay | Admitting: Family Medicine

## 2021-06-10 DIAGNOSIS — Z1231 Encounter for screening mammogram for malignant neoplasm of breast: Secondary | ICD-10-CM

## 2021-06-14 ENCOUNTER — Other Ambulatory Visit: Payer: Self-pay | Admitting: Family Medicine

## 2021-06-14 ENCOUNTER — Ambulatory Visit
Admission: RE | Admit: 2021-06-14 | Discharge: 2021-06-14 | Disposition: A | Discharge status: Home / Self Care | Payer: Medicare Other | Source: Ambulatory Visit | Attending: Family Medicine | Admitting: Family Medicine

## 2021-06-14 DIAGNOSIS — Z1231 Encounter for screening mammogram for malignant neoplasm of breast: Secondary | ICD-10-CM

## 2021-06-17 DIAGNOSIS — Z20828 Contact with and (suspected) exposure to other viral communicable diseases: Secondary | ICD-10-CM | POA: Diagnosis not present

## 2021-06-17 DIAGNOSIS — Z1152 Encounter for screening for COVID-19: Secondary | ICD-10-CM | POA: Diagnosis not present

## 2021-06-21 ENCOUNTER — Encounter: Payer: Self-pay | Admitting: Family Medicine

## 2021-06-21 ENCOUNTER — Ambulatory Visit (INDEPENDENT_AMBULATORY_CARE_PROVIDER_SITE_OTHER): Payer: Medicare Other | Admitting: Family Medicine

## 2021-06-21 VITALS — BP 118/76 | HR 79 | Temp 97.9°F | Ht 62.75 in | Wt 201.1 lb

## 2021-06-21 DIAGNOSIS — E041 Nontoxic single thyroid nodule: Secondary | ICD-10-CM | POA: Diagnosis not present

## 2021-06-21 DIAGNOSIS — E7800 Pure hypercholesterolemia, unspecified: Secondary | ICD-10-CM | POA: Diagnosis not present

## 2021-06-21 DIAGNOSIS — I10 Essential (primary) hypertension: Secondary | ICD-10-CM

## 2021-06-21 DIAGNOSIS — R918 Other nonspecific abnormal finding of lung field: Secondary | ICD-10-CM | POA: Diagnosis not present

## 2021-06-21 DIAGNOSIS — H6123 Impacted cerumen, bilateral: Secondary | ICD-10-CM

## 2021-06-21 DIAGNOSIS — R7303 Prediabetes: Secondary | ICD-10-CM | POA: Diagnosis not present

## 2021-06-21 DIAGNOSIS — D72821 Monocytosis (symptomatic): Secondary | ICD-10-CM | POA: Diagnosis not present

## 2021-06-21 DIAGNOSIS — F418 Other specified anxiety disorders: Secondary | ICD-10-CM

## 2021-06-21 DIAGNOSIS — Z789 Other specified health status: Secondary | ICD-10-CM | POA: Diagnosis not present

## 2021-06-21 DIAGNOSIS — J410 Simple chronic bronchitis: Secondary | ICD-10-CM

## 2021-06-21 DIAGNOSIS — H6120 Impacted cerumen, unspecified ear: Secondary | ICD-10-CM | POA: Insufficient documentation

## 2021-06-21 MED ORDER — DULOXETINE HCL 60 MG PO CPEP: 60.0000 mg | ORAL_CAPSULE | Freq: Every day | ORAL | 3 refills | Status: DC

## 2021-06-21 MED ORDER — BENZONATATE 200 MG PO CAPS: 200.0000 mg | ORAL_CAPSULE | Freq: Three times a day (TID) | ORAL | 1 refills | Status: DC | PRN

## 2021-06-21 NOTE — Assessment & Plan Note (Signed)
Stable ?Lab Results  ?Component Value Date  ? HGBA1C 6.1 06/07/2021  ? ?disc imp of low glycemic diet and wt loss to prevent DM2  ?

## 2021-06-21 NOTE — Assessment & Plan Note (Signed)
Encouraged cessation ?Pt does not feel ready to quit  ?

## 2021-06-21 NOTE — Assessment & Plan Note (Signed)
Improved after a switch from Paxil 60 mg daily to Cymbalta 30 mg daily ?Reviewed stressors/ coping techniques/symptoms/ support sources/ tx options and side effects in detail today  ?Patient would like to go ahead and increase dose to 60 of Cymbalta to 60 mg ? ? ?No significant side effects so far, will update if any occur or if worse ?Encouraged self-care ?

## 2021-06-21 NOTE — Progress Notes (Addendum)
? ?Subjective:  ? ? Patient ID: Anita Harrell, female    DOB: 01/06/1951, 71 y.o.   MRN: [id number] ? ?HPI ?Pt presents for f/u of depression /anxiety and chronic medical problems  ? ?Wt Readings from Last 3 Encounters:  ?06/21/21 201 lb 2 oz (91.2 kg)  ?06/07/21 200 lb (90.7 kg)  ?05/16/21 198 lb (89.8 kg)  ? ?35.91 kg/m? ? ?Last visit noted mood had worsened for no reason ?Reviewed stressors  ?No SI  ?Declines counseling  ? ?Med change made from paxil 60 mg daily to cymbalta 30 mg daily  ?Takes buspar 30 mg tid  ?Ambien cr 12.5 mg for sleep  ? ?Is doing better  ?No crying for a week  ?Husband notices a difference also  ?Still has occ down day (day her mother died)   ? ?Worry is a little better also  ?First thing in the am is hardest  ? ?The buspar helps a lot also  ? ? ?HTN ?bp is stable today  ?No cp or palpitations or headaches or edema  ?No side effects to medicines  ?BP Readings from Last 3 Encounters:  ?06/21/21 118/76  ?06/07/21 124/70  ?11/04/20 110/70  ?   ?Lab Results  ?Component Value Date  ? CREATININE 0.66 06/07/2021  ? BUN 12 06/07/2021  ? NA 137 06/07/2021  ? K 4.1 06/07/2021  ? CL 102 06/07/2021  ? CO2 26 06/07/2021  ? ?Lab Results  ?Component Value Date  ? ALT 17 06/07/2021  ? AST 18 06/07/2021  ? ALKPHOS 75 06/07/2021  ? BILITOT 0.6 06/07/2021  ? ? ?Lab Results  ?Component Value Date  ? TSH 2.14 06/07/2021  ? ? ? ?Hyperlipidemia ?Lab Results  ?Component Value Date  ? CHOL 118 06/07/2021  ? CHOL 108 05/14/2020  ? CHOL 133 01/08/2019  ? ?Lab Results  ?Component Value Date  ? HDL 41.30 06/07/2021  ? HDL 40.60 05/14/2020  ? HDL 44.20 01/08/2019  ? ?Lab Results  ?Component Value Date  ? LDLCALC 37 06/07/2021  ? LDLCALC 39 05/14/2020  ? [place] 54 01/08/2019  ? ?Lab Results  ?Component Value Date  ? TRIG 197.0 (H) 06/07/2021  ? TRIG 143.0 05/14/2020  ? TRIG 173.0 (H) 01/08/2019  ? ?Lab Results  ?Component Value Date  ? CHOLHDL 3 06/07/2021  ? CHOLHDL 3 05/14/2020  ? CHOLHDL 3 01/08/2019  ? ?Lab  Results  ?Component Value Date  ? LDLDIRECT 73.0 08/21/2014  ? ?Crestor 10 mg daily ?Has eaten some hamburger  ?Cut all sugar out  ? ? ? ?Baseline elevated wbc ?Lab Results  ?Component Value Date  ? WBC 12.2 (H) 06/07/2021  ? HGB 14.6 06/07/2021  ? HCT 42.7 06/07/2021  ? MCV 87.4 06/07/2021  ? PLT 156.0 06/07/2021  ?This is up and down in the past  ?No fevers ?No symptoms of infection  ?No urinary changes or change in her chronic cough  ? ?Has chronic cough  ?Uses inhaler on and off  ?Using hycodan  ?Has not tried tessalon  ?Still vapes and not ready to quit  ? ?On chart review-overdue for CT chest (nodular area) and pulmonary clinic f/u ?RUL nodular opacity  ? ?Also thyroid nodule  ?Lost to f/u in 2021 for this ?No clinical changes  ? ? ?Prediabetes ?Lab Results  ?Component Value Date  ? HGBA1C 6.1 06/07/2021  ? ?Stable, no changes  ?Diet is excellent  ?No sugar  ?No potatoes  ? ?RUL nodular opacity CT in 2020 ? ? ? ?  Patient Active Problem List  ? Diagnosis Date Noted  ? Cerumen impaction 06/21/2021  ? Estrogen deficiency 06/07/2021  ? Fatigue 06/21/2020  ? Overactive bladder 10/17/2019  ? Medicare annual wellness visit, subsequent 01/16/2019  ? Thyroid nodule 05/01/2018  ? Electronic cigarette use 04/03/2018  ? Obesity (BMI 30-39.9) 04/03/2018  ? Lung mass 02/01/2018  ? Abnormal chest x-ray 01/29/2018  ? Smokers' cough ([place]) 01/01/2018  ? Tachycardia 11/01/2015  ? Microscopic hematuria 08/13/2012  ? Routine general medical examination at a health care facility 07/22/2012  ? Leukocytosis 03/30/2010  ? Prediabetes 02/21/2008  ? HYPERTENSION, BENIGN ESSENTIAL 01/22/2008  ? ALLERGIC RHINITIS 07/25/2007  ? BACK PAIN 06/26/2007  ? Personal history of goiter 07/27/2006  ? Hyperlipidemia 07/27/2006  ? PANIC DISORDER 07/27/2006  ? Former smoker 07/27/2006  ? Depression with anxiety 07/27/2006  ? Insomnia 07/27/2006  ? Personal history of colonic polyps 06/09/2004  ? ?Past Medical History:  ?Diagnosis Date  ? Allergy   ?  allergic rhinitis  ? Anxiety   ? Back pain   ? Chest pain   ? Depression   ? Epigastric pain   ? Goiter   ? History of tobacco abuse   ? Hyperglycemia   ? mild  ? Hyperlipidemia   ? Hypertension   ? Insomnia   ? Hx of  ? Labile blood pressure   ? Panic disorder   ? History of  ? Personal history of colonic polyps 06/09/2004  ? SVD (spontaneous vaginal delivery)   ? x 2  ? ?Past Surgical History:  ?Procedure Laterality Date  ? ABDOMINAL HYSTERECTOMY N/A 09/24/2012  ? Procedure: HYSTERECTOMY ABDOMINAL;  Surgeon: Gus Height, MD;  Location: [facility];  Service: Gynecology;  Laterality: N/A;  ? BREAST BIOPSY    ? COLONOSCOPY    ? COLONOSCOPY  2017  ? ELBOW SURGERY  2004  ? EYE SURGERY    ? bilateral lasik  ? SALPINGOOPHORECTOMY Bilateral 09/24/2012  ? Procedure: SALPINGO OOPHORECTOMY;  Surgeon: Gus Height, MD;  Location: [facility];  Service: Gynecology;  Laterality: Bilateral;  ? THYROID SURGERY    ? B9 massess  ? WART FULGURATION N/A 09/24/2012  ? Procedure: FULGURATION VAGINAL WART;  Surgeon: Gus Height, MD;  Location: [facility];  Service: Gynecology;  Laterality: N/A;  ? WISDOM TOOTH EXTRACTION    ? ?Social History  ? ?Tobacco Use  ? Smoking status: Former  ?  Packs/day: 1.00  ?  Years: 20.00  ?  Pack years: 20.00  ?  Types: Cigarettes  ?  Quit date: 12/11/2008  ?  Years since quitting: 12.5  ? Smokeless tobacco: Never  ?Vaping Use  ? Vaping Use: Never used  ?Substance Use Topics  ? Alcohol use: No  ?  Alcohol/week: 0.0 standard drinks  ? Drug use: No  ? ?Family History  ?Problem Relation Age of Onset  ? Hypertension Mother   ? Stroke Mother   ? Alcohol abuse Father   ? Cancer Father   ?     lung CA  ? Heart disease Father   ?     CHF  ? Cancer Sister   ?     breast CA  ? Heart disease Sister   ?     CHF from chemotx also has defib  ? Breast cancer Sister   ? Colon cancer Neg Hx   ? Esophageal cancer Neg Hx   ? Rectal cancer Neg Hx   ? Stomach cancer Neg Hx   ? ?Allergies  ?  Allergen Reactions  ? Codeine Nausea And Vomiting  ?   REACTION: Nausea and vomiting ?Pt has tolerated vicodin & percocet in the past  ? Lipitor [Atorvastatin]   ?  Muscle pain   ? Wellbutrin [Bupropion]   ? ?Current Outpatient Medications on File Prior to Visit  ?Medication Sig Dispense Refill  ? albuterol (VENTOLIN HFA) 108 (90 Base) MCG/ACT inhaler INHALE 2 PUFFS INTO THE LUNGS EVERY 6 HOURS AS NEEDED FOR COUGH. 18 g 3  ? busPIRone (BUSPAR) 30 MG tablet TAKE 1/2 TO 1 TABLET (15-30 MG TOTAL) BYMOUTH 3 TIMES DAILY AS DIRECTED 270 tablet 2  ? HYDROcodone bit-homatropine (HYCODAN) 5-1.5 MG/5ML syrup TAKE FIVE MLS (1 TEASPOONFUL) BY MOUTH AS NEEDED 60 mL 0  ? lisinopril-hydrochlorothiazide (ZESTORETIC) 10-12.5 MG tablet TAKE ONE HALF TABLET BY MOUTH DAILY 45 tablet 0  ? meloxicam (MOBIC) 15 MG tablet Take 1 tablet (15 mg total) by mouth daily as needed for pain. With a meal 30 tablet 0  ? metoprolol succinate (TOPROL-XL) 50 MG 24 hr tablet TAKE 1 TABLET BY MOUTH ONCE DAILY (TAKE WITH OR IMMEDIATELY FOLLOWING A MEAL) 90 tablet 0  ? rosuvastatin (CRESTOR) 10 MG tablet TAKE 1 TABLET BY MOUTH ONCE A DAY 90 tablet 0  ? solifenacin (VESICARE) 10 MG tablet TAKE 1 TABLET BY MOUTH ONCE A DAY 90 tablet 0  ? zolpidem (AMBIEN CR) 12.5 MG CR tablet TAKE 1 TABLET BY MOUTH ONCE DAILY AT BEDTIME AS NEEDED FOR SLEEP 90 tablet 0  ? ?No current facility-administered medications on file prior to visit.  ?  ?Review of Systems  ?Constitutional:  Positive for fatigue. Negative for activity change, appetite change, fever and unexpected weight change.  ?HENT:  Negative for congestion, ear pain, rhinorrhea, sinus pressure and sore throat.   ?Eyes:  Negative for pain, redness and visual disturbance.  ?Respiratory:  Positive for cough. Negative for shortness of breath, wheezing and stridor.   ?Cardiovascular:  Negative for chest pain, palpitations and leg swelling.  ?Gastrointestinal:  Negative for abdominal pain, blood in stool, constipation and diarrhea.  ?Endocrine: Negative for polydipsia and  polyuria.  ?Genitourinary:  Negative for dysuria, frequency and urgency.  ?Musculoskeletal:  Negative for arthralgias, back pain and myalgias.  ?Skin:  Negative for pallor and rash.  ?Allergic/Immunologic: Evelena Leyden

## 2021-06-21 NOTE — Patient Instructions (Addendum)
Try the tessalon for cough (swallow whole) up to every 8 hours  ?Let's try to avoid the narcotic if we can  ? ?Keep thinking about quitting vaping  ? ?For prediabetes ?Try to get most of your carbohydrates from produce (with the exception of white potatoes)  ?Eat less bread/pasta/rice/snack foods/cereals/sweets and other items from the middle of the grocery store (processed carbs) ? ?Use ground [place] for cooking instead of ground beef when you can ?It works well  ? ?Go up on cymbalta to 60 mg daily  ?If any problems or side effects let us know  ? ?You need follow up with pulmonary - I will put a referral in  ?Also you are due for thyroid ultrasound -I will put an order in for that  ?You will get a call for that  ? ?Ears are clear - if any problems let us know  ? ? ? ? ? ? ? ? ?

## 2021-06-21 NOTE — Assessment & Plan Note (Addendum)
Ongoing  ?Uses hycodan prn  ?Would like to change on non narcotic option ?Tessalon px ?If not improved consider changing ace (not a problem in the past but ? If it may add) ? ? ?

## 2021-06-21 NOTE — Assessment & Plan Note (Signed)
Wbc is mildly elevated again  ?Rev past labs and path review ?No new symptoms but has had stress ?Cough is chronic and not productive  ?Will continue to follow ?

## 2021-06-21 NOTE — Assessment & Plan Note (Signed)
Disc goals for lipids and reasons to control them Rev last labs with pt Rev low sat fat diet in detail  Plan to continue crestor 10 mg daily   

## 2021-06-21 NOTE — Assessment & Plan Note (Signed)
Resolved with simple irrigation  ?Voiced relief and inc hearing  ?inst to touch base if symptoms return ?

## 2021-06-21 NOTE — Assessment & Plan Note (Addendum)
bp in fair control at this time  ?BP Readings from Last 1 Encounters:  ?06/21/21 118/76  ? ?No changes needed ?Most recent labs reviewed  ?Disc lifstyle change with low sodium diet and exercise  ?Plan to continue lisinopril hct 10-12.5 mg daily and metoprolol xl 50 mg daily ?

## 2021-06-21 NOTE — Assessment & Plan Note (Signed)
Pt is due for re check of CT  ?Last one 2020, was then lost to the pandemic ?

## 2021-06-21 NOTE — Assessment & Plan Note (Signed)
Overdue for thyroid US (2020 was supposed to have a 1 y recall but lost to f/u due to pandemic) ?Order done ?No clinical changes ?Lab Results  ?Component Value Date  ? TSH 2.14 06/07/2021  ? ? ? ?

## 2021-06-22 ENCOUNTER — Encounter: Payer: Self-pay | Admitting: *Deleted

## 2021-06-27 ENCOUNTER — Other Ambulatory Visit: Payer: Self-pay | Admitting: Family Medicine

## 2021-06-27 DIAGNOSIS — Z20822 Contact with and (suspected) exposure to covid-19: Secondary | ICD-10-CM | POA: Diagnosis not present

## 2021-06-27 NOTE — Telephone Encounter (Signed)
Name of Medication: Ambien CR ?Name of Pharmacy: [facility] ?Last Fill or Written Date and Quantity:  ?Last Office Visit and Type: f/u on 06/21/21 ?Next Office Visit and Type: none scheduled ? ?  ?

## 2021-07-11 ENCOUNTER — Ambulatory Visit
Admission: RE | Admit: 2021-07-11 | Discharge: 2021-07-11 | Disposition: A | Discharge status: Home / Self Care | Payer: Medicare Other | Source: Ambulatory Visit | Attending: Family Medicine | Admitting: Family Medicine

## 2021-07-11 ENCOUNTER — Encounter: Payer: Self-pay | Admitting: Family Medicine

## 2021-07-11 ENCOUNTER — Other Ambulatory Visit: Payer: Self-pay

## 2021-07-11 DIAGNOSIS — J410 Simple chronic bronchitis: Secondary | ICD-10-CM | POA: Diagnosis not present

## 2021-07-11 DIAGNOSIS — E041 Nontoxic single thyroid nodule: Secondary | ICD-10-CM

## 2021-07-11 DIAGNOSIS — I700 Atherosclerosis of aorta: Secondary | ICD-10-CM | POA: Diagnosis not present

## 2021-07-11 DIAGNOSIS — R918 Other nonspecific abnormal finding of lung field: Secondary | ICD-10-CM

## 2021-07-11 DIAGNOSIS — J439 Emphysema, unspecified: Secondary | ICD-10-CM | POA: Diagnosis not present

## 2021-07-12 ENCOUNTER — Encounter: Payer: Self-pay | Admitting: Family Medicine

## 2021-07-13 DIAGNOSIS — R051 Acute cough: Secondary | ICD-10-CM | POA: Diagnosis not present

## 2021-07-13 DIAGNOSIS — R059 Cough, unspecified: Secondary | ICD-10-CM | POA: Diagnosis not present

## 2021-07-13 DIAGNOSIS — Z20822 Contact with and (suspected) exposure to covid-19: Secondary | ICD-10-CM | POA: Diagnosis not present

## 2021-07-22 ENCOUNTER — Encounter: Payer: Self-pay | Admitting: *Deleted

## 2021-07-26 ENCOUNTER — Ambulatory Visit (INDEPENDENT_AMBULATORY_CARE_PROVIDER_SITE_OTHER): Payer: Medicare Other | Admitting: Pulmonary Disease

## 2021-07-26 ENCOUNTER — Encounter: Payer: Self-pay | Admitting: Pulmonary Disease

## 2021-07-26 VITALS — BP 124/70 | HR 100 | Temp 97.9°F | Ht 62.75 in | Wt 203.2 lb

## 2021-07-26 DIAGNOSIS — R0602 Shortness of breath: Secondary | ICD-10-CM

## 2021-07-26 DIAGNOSIS — I4891 Unspecified atrial fibrillation: Secondary | ICD-10-CM

## 2021-07-26 DIAGNOSIS — I499 Cardiac arrhythmia, unspecified: Secondary | ICD-10-CM | POA: Diagnosis not present

## 2021-07-26 DIAGNOSIS — R9389 Abnormal findings on diagnostic imaging of other specified body structures: Secondary | ICD-10-CM | POA: Diagnosis not present

## 2021-07-26 DIAGNOSIS — E669 Obesity, unspecified: Secondary | ICD-10-CM | POA: Diagnosis not present

## 2021-07-26 DIAGNOSIS — Z720 Tobacco use: Secondary | ICD-10-CM

## 2021-07-26 MED ORDER — APIXABAN 5 MG PO TABS: 5.0000 mg | ORAL_TABLET | Freq: Two times a day (BID) | ORAL | 1 refills | Status: DC

## 2021-07-26 NOTE — Progress Notes (Signed)
Subjective:    Patient ID: Anita Harrell, female    DOB: November 01, 1950, 71 y.o.   MRN: [id number] Patient Care Team: Abner Greenspan, MD as PCP - Wynn Banker, MD as Consulting Physician (Obstetrics and Gynecology) Debbora Dus, [facility] as Pharmacist (Pharmacist)  Chief Complaint  Patient presents with   Follow-up    CT-- sob with exertion and dry cough.    HPI Patient is a 71 year old former smoker previously followed in 2020 last visit here was 27 February 2019.  He is read referred here because of a "lung mass and dry cough".  Recall that she had been referred here to be a mass on the left upper lobe.  However upon further examination this turned out to be a pneumonia with dense consolidation.  Patient was followed until this finding cleared.  Her last CT prior to loss of follow-up was on 26 February 2019.This showed continued ongoing resolution of her pneumonic process.  Most recently she had a CT chest done on 1 May which shows stable changes from the 26 February 2019 exam and these are likely benign changes.  She has pleural-parenchymal scarring of the left lower lobe as residual from her prior pneumonia.  There are no new masses, nodules or consolidations.  Recall also that  at the time of her prior evaluation in 2020 she had PFTs that were essentially normal with the exception of a low ERV indicating obesity.  The patient today presents with no complaints of cough, hemoptysis, weight loss or anorexia.  As previous she does complain of dyspnea on exertion and feeling that she is always a "couch potato".  The patient notes that albuterol inhaler does nothing for the symptoms.  She does not endorse any chest pain or tachypalpitations.    She is very sedentary.  She is retired from [employer].  We did discuss the findings on the CT and the aforementioned stability for now 3 years.  With regards to the lung findings no further imaging is necessary.  The CT  did also mention coronary artery calcifications  Review of Systems A 10 point review of systems was performed and it is as noted above otherwise negative.  Past Medical History:  Diagnosis Date   Allergy    allergic rhinitis   Anxiety    Back pain    Chest pain    Depression    Epigastric pain    Goiter    History of tobacco abuse    Hyperglycemia    mild   Hyperlipidemia    Hypertension    Insomnia    Hx of   Labile blood pressure    Panic disorder    History of   Personal history of colonic polyps 06/09/2004   SVD (spontaneous vaginal delivery)    x 2   Past Surgical History:  Procedure Laterality Date   ABDOMINAL HYSTERECTOMY N/A 09/24/2012   Procedure: HYSTERECTOMY ABDOMINAL;  Surgeon: Gus Height, MD;  Location: [facility];  Service: Gynecology;  Laterality: N/A;   BREAST BIOPSY     COLONOSCOPY     COLONOSCOPY  2017   ELBOW SURGERY  2004   EYE SURGERY     bilateral lasik   SALPINGOOPHORECTOMY Bilateral 09/24/2012   Procedure: SALPINGO OOPHORECTOMY;  Surgeon: Gus Height, MD;  Location: [facility];  Service: Gynecology;  Laterality: Bilateral;   THYROID SURGERY     B9 massess   WART FULGURATION N/A 09/24/2012   Procedure: FULGURATION VAGINAL WART;  Surgeon:  Gus Height, MD;  Location: [facility];  Service: Gynecology;  Laterality: N/A;   WISDOM TOOTH EXTRACTION     Patient Active Problem List   Diagnosis Date Noted   Cerumen impaction 06/21/2021   Estrogen deficiency 06/07/2021   Fatigue 06/21/2020   Overactive bladder 10/17/2019   Medicare annual wellness visit, subsequent 01/16/2019   Thyroid nodule 05/01/2018   Electronic cigarette use 04/03/2018   Obesity (BMI 30-39.9) 04/03/2018   Lung mass 02/01/2018   Abnormal chest x-ray 01/29/2018   Smokers' cough ([place]) 01/01/2018   Tachycardia 11/01/2015   Microscopic hematuria 08/13/2012   Routine general medical examination at a health care facility 07/22/2012   Leukocytosis 03/30/2010   Prediabetes 02/21/2008    HYPERTENSION, BENIGN ESSENTIAL 01/22/2008   ALLERGIC RHINITIS 07/25/2007   BACK PAIN 06/26/2007   Personal history of goiter 07/27/2006   Hyperlipidemia 07/27/2006   PANIC DISORDER 07/27/2006   Former smoker 07/27/2006   Depression with anxiety 07/27/2006   Insomnia 07/27/2006   Personal history of colonic polyps 06/09/2004   Family History  Problem Relation Age of Onset   Hypertension Mother    Stroke Mother    Alcohol abuse Father    Cancer Father        lung CA   Heart disease Father        CHF   Cancer Sister        breast CA   Heart disease Sister        CHF from chemotx also has defib   Breast cancer Sister    Colon cancer Neg Hx    Esophageal cancer Neg Hx    Rectal cancer Neg Hx    Stomach cancer Neg Hx    Social History   Tobacco Use   Smoking status: Former    Packs/day: 1.00    Years: 20.00    Pack years: 20.00    Types: Cigarettes, E-cigarettes    Quit date: 12/11/2008    Years since quitting: 12.6   Smokeless tobacco: Never   Tobacco comments:    She is vaping   Substance Use Topics   Alcohol use: No    Alcohol/week: 0.0 standard drinks   Allergies  Allergen Reactions   Codeine Nausea And Vomiting    REACTION: Nausea and vomiting Pt has tolerated vicodin & percocet in the past   Lipitor [Atorvastatin]     Muscle pain    Wellbutrin [Bupropion]    Current Meds  Medication Sig   albuterol (VENTOLIN HFA) 108 (90 Base) MCG/ACT inhaler INHALE 2 PUFFS INTO THE LUNGS EVERY 6 HOURS AS NEEDED FOR COUGH.   benzonatate (TESSALON) 200 MG capsule Take 1 capsule (200 mg total) by mouth 3 (three) times daily as needed. Swallow whole   busPIRone (BUSPAR) 30 MG tablet TAKE 1/2 TO 1 TABLET (15-30 MG TOTAL) BYMOUTH 3 TIMES DAILY AS DIRECTED   DULoxetine (CYMBALTA) 60 MG capsule Take 1 capsule (60 mg total) by mouth daily.   HYDROcodone bit-homatropine (HYCODAN) 5-1.5 MG/5ML syrup TAKE FIVE MLS (1 TEASPOONFUL) BY MOUTH AS NEEDED   lisinopril-hydrochlorothiazide  (ZESTORETIC) 10-12.5 MG tablet TAKE ONE HALF TABLET BY MOUTH DAILY   meloxicam (MOBIC) 15 MG tablet Take 1 tablet (15 mg total) by mouth daily as needed for pain. With a meal   metoprolol succinate (TOPROL-XL) 50 MG 24 hr tablet TAKE 1 TABLET BY MOUTH ONCE DAILY (TAKE WITH OR IMMEDIATELY FOLLOWING A MEAL)   rosuvastatin (CRESTOR) 10 MG tablet TAKE 1 TABLET BY MOUTH ONCE  A DAY   solifenacin (VESICARE) 10 MG tablet TAKE 1 TABLET BY MOUTH ONCE A DAY   zolpidem (AMBIEN CR) 12.5 MG CR tablet TAKE 1 TABLET BY MOUTH ONCE DAILY AT BEDTIME AS NEEDED FOR SLEEP   Immunization History  Administered Date(s) Administered   Fluad Quad(high Dose 65+) 01/16/2019   Influenza Split 03/27/2011, 01/23/2012   Influenza,inj,Quad PF,6+ Mos 11/26/2012, 11/10/2013, 11/24/2016, 12/25/2017   PFIZER(Purple Top)SARS-COV-2 Vaccination 10/20/2019, 11/10/2019   Pneumococcal Conjugate-13 08/21/2014   Pneumococcal Polysaccharide-23 05/19/2008, 08/30/2015   Td 03/13/2001   Tdap 01/23/2012   Zoster, Live 01/07/2013      Objective:   Physical Exam BP 124/70 (BP Location: Left Arm, Cuff Size: Normal)   Pulse 100   Temp 97.9 F (36.6 C) (Temporal)   Ht 5' 2.75" (1.594 m)   Wt 203 lb 3.2 oz (92.2 kg)   SpO2 95%   BMI 36.28 kg/m  GENERAL: Obese woman, no acute distress, fully ambulatory, no conversational dyspnea. HEAD: Normocephalic, atraumatic.  EYES: Pupils equal, round, reactive to light.  No scleral icterus.  MOUTH: Pharynx clear, oral mucosa moist. NECK: Supple. No thyromegaly. Trachea midline. No JVD.  No adenopathy. PULMONARY: Good air entry bilaterally.  No adventitious sounds. CARDIOVASCULAR: S1 and S2.  Irregular heart rate (atrial fibrillation) between 100-113, no cardiac murmurs discerned. ABDOMEN: Obese, otherwise benign. MUSCULOSKELETAL: No joint deformity, no clubbing, no edema.  NEUROLOGIC: No overt focal deficit, no gait disturbance, speech is fluent. SKIN: Intact,warm,dry. PSYCH: Mood and behavior  normal.  Recent Results (from the past 2160 hour(s))  CBC with Differential/Platelet     Status: Abnormal   Collection Time: 06/07/21  3:00 PM  Result Value Ref Range   WBC 12.2 (H) 4.0 - 10.5 K/uL   RBC 4.88 3.87 - 5.11 Mil/uL   Hemoglobin 14.6 12.0 - 15.0 g/dL   HCT 42.7 36.0 - 46.0 %   MCV 87.4 78.0 - 100.0 fl   MCHC 34.1 30.0 - 36.0 g/dL   RDW 12.9 11.5 - 15.5 %   Platelets 156.0 150.0 - 400.0 K/uL   Neutrophils Relative % 52.1 43.0 - 77.0 %   Lymphocytes Relative 17.4 12.0 - 46.0 %   Monocytes Relative 29.2 (H) 3.0 - 12.0 %    Comment: Specimen too old for Manual Differential to be performed.   Eosinophils Relative 0.7 0.0 - 5.0 %   Basophils Relative 0.6 0.0 - 3.0 %   Neutro Abs 6.3 1.4 - 7.7 K/uL   Lymphs Abs 2.1 0.7 - 4.0 K/uL   Monocytes Absolute 3.6 (H) 0.1 - 1.0 K/uL   Eosinophils Absolute 0.1 0.0 - 0.7 K/uL   Basophils Absolute 0.1 0.0 - 0.1 K/uL  Comprehensive metabolic panel     Status: None   Collection Time: 06/07/21  3:00 PM  Result Value Ref Range   Sodium 137 135 - 145 mEq/L   Potassium 4.1 3.5 - 5.1 mEq/L   Chloride 102 96 - 112 mEq/L   CO2 26 19 - 32 mEq/L   Glucose, Bld 92 70 - 99 mg/dL   BUN 12 6 - 23 mg/dL   Creatinine, Ser 0.66 0.40 - 1.20 mg/dL   Total Bilirubin 0.6 0.2 - 1.2 mg/dL   Alkaline Phosphatase 75 39 - 117 U/L   AST 18 0 - 37 U/L   ALT 17 0 - 35 U/L   Total Protein 7.4 6.0 - 8.3 g/dL   Albumin 4.7 3.5 - 5.2 g/dL   GFR 87.73 >60.00 mL/min  Comment: Calculated using the CKD-EPI Creatinine Equation (2021)   Calcium 10.1 8.4 - 10.5 mg/dL  Lipid panel     Status: Abnormal   Collection Time: 06/07/21  3:00 PM  Result Value Ref Range   Cholesterol 118 0 - 200 mg/dL    Comment: ATP III Classification       Desirable:  < 200 mg/dL               Borderline High:  200 - 239 mg/dL          High:  > = 240 mg/dL   Triglycerides 197.0 (H) 0.0 - 149.0 mg/dL    Comment: Normal:  <150 mg/dLBorderline High:  150 - 199 mg/dL   HDL 41.30 >39.00  mg/dL   VLDL 39.4 0.0 - 40.0 mg/dL   LDL Cholesterol 37 0 - 99 mg/dL   Total CHOL/HDL Ratio 3     Comment:                Men          Women1/2 Average Risk     3.4          3.3Average Risk          5.0          4.42X Average Risk          9.6          7.13X Average Risk          15.0          11.0                       NonHDL 76.70     Comment: NOTE:  Non-HDL goal should be 30 mg/dL higher than patient's LDL goal (i.e. LDL goal of < 70 mg/dL, would have non-HDL goal of < 100 mg/dL)  TSH     Status: None   Collection Time: 06/07/21  3:00 PM  Result Value Ref Range   TSH 2.14 0.35 - 5.50 uIU/mL  Hemoglobin A1c     Status: None   Collection Time: 06/07/21  3:00 PM  Result Value Ref Range   Hgb A1c MFr Bld 6.1 4.6 - 6.5 %    Comment: Glycemic Control Guidelines for People with Diabetes:Non Diabetic:  <6%Goal of Therapy: <7%Additional Action Suggested:  >8%    Representative image from CT scan performed 11 Jul 2021 this shows stable GGO (R) and stable pleural-parenchymal scarring residual from prior pneumonia in 2020 (L):       EKG was performed today:Atrial fibrillation rate 113- frequent multiform ectopic ventricular beats  Low voltage in precordial leads.   -RSR(V1) -nondiagnostic.   -  Nonspecific T-abnormality.  Independently reviewed, discussed with patient.   Assessment & Plan:     ICD-10-CM   1. Atrial fibrillation, new onset ([place])  I48.91 EKG 12-Lead   Chronicity really cannot be established Newly found today Discussed with [place] appointment with cardiology tomorrow Will start Eliquis 5 mg twice daily     2. Shortness of breath  R06.02    Suspect multifactorial Prior normal PFTs A-fib, obesity, deconditioning    3. Abnormal CT of the chest  R93.89    Findings have been stable since 2020 Findings are likely benign Pleural-parenchymal scarring left upper lobe after pneumonia GGO right upper lobe     4. Obesity (BMI 30-39.9)  E66.9    This issue adds  complexity to her management Recommend weight loss  5. Vapes nicotine containing substance  Z72.0    Counseled with regards to discontinuation     Orders Placed This Encounter  Procedures   EKG 12-Lead   Meds ordered this encounter  Medications   apixaban (ELIQUIS) 5 MG TABS tablet    Sig: Take 1 tablet (5 mg total) by mouth 2 (two) times daily.    Dispense:  60 tablet    Refill:  1   The patient's previous abnormalities noted on CT chest are stable from 2020 and are likely benign.  Recall she had a very severe left upper lobe pneumonia this has left some mild pleural-parenchymal scarring on the left upper lobe that is of no consequence and has not shown any change since 2020.  She has a small GGO on the right upper lobe that also has not change since 2020.  As this has been stable in 3 years it is likely benign.  The patient today also complained of shortness of breath, fatigue and feeling like she was "a couch potato".  Examination today showed that she had atrial fibrillation with a rapid ventricular response.  I discussed the case with Dr. Rockey Situ and he will see the patient in consultation tomorrow may 17th at 1:40 PM.  I suspect that her cardiac issues are driving her shortness of breath and fatigue.  She previously had normal PFTs with the exception of a low ERV indicative of obesity.  Obesity would also be adding to this issue as well as deconditioning.  She was encouraged to engage in weight loss.  We have started the patient on Eliquis.  We will defer further changes on the patient's medications to Dr. Rockey Situ and the cardiology team.  We will see the patient in follow-up in 4 to 6 weeks time she is to contact us prior to that time should any new difficulties arise.  Total visit time 50 minutes.   Renold Don, MD Advanced Bronchoscopy PCCM [facility]    *This note was dictated using voice recognition software/Dragon.  Despite best efforts to  proofread, errors can occur which can change the meaning. Any transcriptional errors that result from this process are unintentional and may not be fully corrected at the time of dictation.

## 2021-07-26 NOTE — Patient Instructions (Addendum)
You have atrial fibrillation.  We have set an appointment to see Dr. Rockey Situ, cardiologist tomorrow at 1:40 PM.  You will be placed on a blood thinner called Eliquis this is 1 tablet twice a day.  Because you are on a blood thinner you should not take any medications that are aspirin or similar.  Therefore your Mobic (meloxicam) will need to be discontinued.  With regards to your lung findings, these have been stable over 3 years and are likely benign.  We will see you in follow-up in 4 to 6 weeks time.  Please do let us know how the heart evaluation turns out.

## 2021-07-27 ENCOUNTER — Ambulatory Visit (INDEPENDENT_AMBULATORY_CARE_PROVIDER_SITE_OTHER): Payer: Medicare Other | Admitting: Cardiovascular Disease

## 2021-07-27 ENCOUNTER — Encounter: Payer: Self-pay | Admitting: Cardiovascular Disease

## 2021-07-27 ENCOUNTER — Encounter: Payer: Self-pay | Admitting: Pulmonary Disease

## 2021-07-27 VITALS — BP 128/78 | HR 114 | Ht 63.0 in | Wt 202.5 lb

## 2021-07-27 DIAGNOSIS — R0602 Shortness of breath: Secondary | ICD-10-CM

## 2021-07-27 DIAGNOSIS — F419 Anxiety disorder, unspecified: Secondary | ICD-10-CM | POA: Diagnosis not present

## 2021-07-27 DIAGNOSIS — I4819 Other persistent atrial fibrillation: Secondary | ICD-10-CM

## 2021-07-27 MED ORDER — METOPROLOL SUCCINATE ER 50 MG PO TB24: ORAL_TABLET | ORAL | 0 refills | Status: DC

## 2021-07-27 NOTE — Progress Notes (Signed)
Cardiology Office Note ? ?Date:  07/27/2021  ? ?ID:  Anita Harrell, DOB 05-28-1950, MRN [id number] ? ?PCP:  Abner Greenspan, MD  ? ?Chief Complaint  ?Patient presents with  ? New Patient (Initial Visit)  ?  Ref by Dr. Patsey Berthold for new A-Fib. Patient c/o shortness of breath and fatigue.  Medications reviewed by the patient verbally.   ? ? ?HPI:  ?[name] is a 71 year old woman with past medical history of ?Hypertension ?Depression/anxiety ?Vapor cigarettes/former smoker 20 years ?Chronic shortness of breath/smoker's cough ?Who presents by referral from Dr. Karie Kirks for new atrial fibrillation ? ?Was seen in the pulmonary clinic yesterday noted to have new onset atrial fibrillation, rate elevated ? we had talked with Dr. Patsey Berthold and recommended she follow-up in cardiology clinic and start Eliquis ?In pulmonary, was started on Eliquis ?No prior cardiac work-up available for review ? ?CT scan chest with aortic and coronary calcification ?Emphysema ? ?EKG personally reviewed by myself on todays visit ?Atrial fibrillation rate 114 bpm pvc ?Relatively unchanged from EKG yesterday showing atrial fibrillation ?Old EKG 2014 showing normal sinus rhythm ? ?Unclear when she converted to atrial fibrillation, has been having some shortness of breath with stairs for quite some time ?Deconditioned at baseline ? ?Reports having issues with anxiety ? ?PMH:   has a past medical history of Allergy, Anxiety, Back pain, Chest pain, Depression, Epigastric pain, Goiter, History of tobacco abuse, Hyperglycemia, Hyperlipidemia, Hypertension, Insomnia, Labile blood pressure, Panic disorder, Personal history of colonic polyps (06/09/2004), and SVD (spontaneous vaginal delivery). ? ?PSH:    ?Past Surgical History:  ?Procedure Laterality Date  ? ABDOMINAL HYSTERECTOMY N/A 09/24/2012  ? Procedure: HYSTERECTOMY ABDOMINAL;  Surgeon: Gus Height, MD;  Location: [facility];  Service: Gynecology;  Laterality: N/A;  ? BREAST BIOPSY    ?  COLONOSCOPY    ? COLONOSCOPY  2017  ? ELBOW SURGERY  2004  ? EYE SURGERY    ? bilateral lasik  ? SALPINGOOPHORECTOMY Bilateral 09/24/2012  ? Procedure: SALPINGO OOPHORECTOMY;  Surgeon: Gus Height, MD;  Location: [facility];  Service: Gynecology;  Laterality: Bilateral;  ? THYROID SURGERY    ? B9 massess  ? WART FULGURATION N/A 09/24/2012  ? Procedure: FULGURATION VAGINAL WART;  Surgeon: Gus Height, MD;  Location: [facility];  Service: Gynecology;  Laterality: N/A;  ? WISDOM TOOTH EXTRACTION    ? ? ?Current Outpatient Medications  ?Medication Sig Dispense Refill  ? albuterol (VENTOLIN HFA) 108 (90 Base) MCG/ACT inhaler INHALE 2 PUFFS INTO THE LUNGS EVERY 6 HOURS AS NEEDED FOR COUGH. 18 g 3  ? apixaban (ELIQUIS) 5 MG TABS tablet Take 1 tablet (5 mg total) by mouth 2 (two) times daily. 60 tablet 1  ? benzonatate (TESSALON) 200 MG capsule Take 1 capsule (200 mg total) by mouth 3 (three) times daily as needed. Swallow whole 30 capsule 1  ? busPIRone (BUSPAR) 30 MG tablet TAKE 1/2 TO 1 TABLET (15-30 MG TOTAL) BYMOUTH 3 TIMES DAILY AS DIRECTED 270 tablet 2  ? DULoxetine (CYMBALTA) 60 MG capsule Take 1 capsule (60 mg total) by mouth daily. 90 capsule 3  ? HYDROcodone bit-homatropine (HYCODAN) 5-1.5 MG/5ML syrup TAKE FIVE MLS (1 TEASPOONFUL) BY MOUTH AS NEEDED 60 mL 0  ? lisinopril-hydrochlorothiazide (ZESTORETIC) 10-12.5 MG tablet TAKE ONE HALF TABLET BY MOUTH DAILY 45 tablet 0  ? metoprolol succinate (TOPROL-XL) 50 MG 24 hr tablet TAKE 1 TABLET BY MOUTH ONCE DAILY (TAKE WITH OR IMMEDIATELY FOLLOWING A MEAL) 90 tablet 0  ?  rosuvastatin (CRESTOR) 10 MG tablet TAKE 1 TABLET BY MOUTH ONCE A DAY 90 tablet 0  ? solifenacin (VESICARE) 10 MG tablet TAKE 1 TABLET BY MOUTH ONCE A DAY 90 tablet 0  ? zolpidem (AMBIEN CR) 12.5 MG CR tablet TAKE 1 TABLET BY MOUTH ONCE DAILY AT BEDTIME AS NEEDED FOR SLEEP 90 tablet 0  ? ?No current facility-administered medications for this visit.  ? ? ? ?Allergies:   Codeine, Lipitor [atorvastatin], and  Wellbutrin [bupropion]  ? ?Social History:  The patient  reports that she quit smoking about 12 years ago. Her smoking use included cigarettes and e-cigarettes. She has a 20.00 pack-year smoking history. She has never used smokeless tobacco. She reports that she does not drink alcohol and does not use drugs.  ? ?Family History:   family history includes Alcohol abuse in her father; Breast cancer in her sister; Cancer in her father and sister; Heart disease in her father and sister; Hypertension in her mother; Stroke in her mother.  ? ? ?Review of Systems: ?Review of Systems  ?Constitutional: Negative.   ?HENT: Negative.    ?Respiratory:  Positive for shortness of breath.   ?Cardiovascular: Negative.   ?Gastrointestinal: Negative.   ?Musculoskeletal: Negative.   ?Neurological: Negative.   ?Psychiatric/Behavioral: Negative.    ?All other systems reviewed and are negative. ? ? ?PHYSICAL EXAM: ?VS:  BP 128/78 (BP Location: Right Arm, Patient Position: Sitting, Cuff Size: Normal)   Pulse (!) 114   Ht '5\' 3"'$  (1.6 m)   Wt 202 lb 8 oz (91.9 kg)   SpO2 97%   BMI 35.87 kg/m?  , BMI Body mass index is 35.87 kg/m?. ?GEN: Well nourished, well developed, in no acute distress ?HEENT: normal ?Neck: no JVD, carotid bruits, or masses ?Cardiac: Irregularly irregular no murmurs, rubs, or gallops,no edema  ?Respiratory:  clear to auscultation bilaterally, normal work of breathing ?GI: soft, nontender, nondistended, + BS ?MS: no deformity or atrophy ?Skin: warm and dry, no rash ?Neuro:  Strength and sensation are intact ?Psych: euthymic mood, full affect ? ? ?Recent Labs: ?06/07/2021: ALT 17; BUN 12; Creatinine, Ser 0.66; Hemoglobin 14.6; Platelets 156.0; Potassium 4.1; Sodium 137; TSH 2.14  ? ? ?Lipid Panel ?Lab Results  ?Component Value Date  ? CHOL 118 06/07/2021  ? HDL 41.30 06/07/2021  ? LDLCALC 37 06/07/2021  ? TRIG 197.0 (H) 06/07/2021  ? ?  ? ?Wt Readings from Last 3 Encounters:  ?07/27/21 202 lb 8 oz (91.9 kg)  ?07/26/21 203  lb 3.2 oz (92.2 kg)  ?06/21/21 201 lb 2 oz (91.2 kg)  ?  ? ? ? ?ASSESSMENT AND PLAN: ? ?Problem List Items Addressed This Visit   ?None ?Visit Diagnoses   ? ? Persistent atrial fibrillation (HCC)    -  Primary  ? Relevant Orders  ? EKG 12-Lead  ? SOB (shortness of breath)      ? Relevant Orders  ? EKG 12-Lead  ? Anxiety      ? ?  ? ?Persistent atrial fibrillation with RVR ?Long discussion concerning atrial fibrillation, mechanism, treatment ?Recommend she double her metoprolol succinate up to 50 twice daily for better rate control ?Recommend she hold the lisinopril HCTZ to avoid hypotension ?Continue Eliquis 5 twice daily ?Echocardiogram ordered ?Does not appear to have volume overload on today's visit ?Recommend possible cardioversion can be arranged in follow-up ?We will hold off on antiarrhythmic medications at this time ? ?Shortness of breath ?Possibly secondary to atrial fibrillation though unable to exclude General  deconditioning ? ?Anxiety ?Reassurance provided ? ? Total encounter time more than 60 minutes ? Greater than 50% was spent in counseling and coordination of care with the patient ? ? ? ?Signed, ?Esmond Plants, M.D., Ph.D. ?[facility] [place], [place] ?[phone] ?

## 2021-07-27 NOTE — Patient Instructions (Addendum)
Medication Instructions:  ?- Your physician has recommended you make the following change in your medication:  ? ?1) INCREASE metoprolol succinate 50 mg: ?- take 1 tablet by mouth TWICE a day (or every 12 hours) ? ?2) HOLD lisinopril HCTZ 1/2 pill for now ? ?3) STAY ON eliquis 5 mg: ?- take 1 tablet by mouth twice a day (or every 12 hours) ? ?If you need a refill on your cardiac medications before your next appointment, please call your pharmacy.  ? ? ?Lab work: ?No new labs needed ? ? ?Testing/Procedures: ? ?1) Echocardiogram: ?- Your physician has requested that you have an echocardiogram. Echocardiography is a painless test that uses sound waves to create images of your heart. It provides your doctor with information about the size and shape of your heart and how well your heart?s chambers and valves are working. This procedure takes approximately one hour. There are no restrictions for this procedure.There is a possibility that an IV may need to be started during your test to inject an image enhancing agent. This is done to obtain more optimal pictures of your heart. Therefore we ask that you do at least drink some water prior to coming in to hydrate your veins.  ? ? ? ?Follow-Up: ?At [facility], you and your health needs are our priority.  As part of our continuing mission to provide you with exceptional heart care, we have created designated Provider Care Teams.  These Care Teams include your primary Cardiologist (physician) and Advanced Practice Providers (APPs -  Physician Assistants and Nurse Practitioners) who all work together to provide you with the care you need, when you need it. ? ?You will need a follow up appointment in 1 month ? ?Providers on your designated Care Team:   ?Murray Hodgkins, NP ?Christell Faith, PA-C ?Cadence Kathlen Mody, PA-C ? ?COVID-19 Vaccine Information can be found at: ShippingScam.co.uk For questions related to vaccine  distribution or appointments, please email [email or web address] or call [phone].  ? ? ? ?Atrial Fibrillation ? ?Atrial fibrillation is a type of heartbeat that is irregular or fast. If you have this condition, your heart beats without any order. This makes it hard for your heart to pump blood in a normal way. ?Atrial fibrillation may come and go, or it may become a long-lasting problem. If this condition is not treated, it can put you at higher risk for stroke, heart failure, and other heart problems. ?What are the causes? ?This condition may be caused by diseases that damage the heart. They include: ?High blood pressure. ?Heart failure. ?Heart valve disease. ?Heart surgery. ?Other causes include: ?Diabetes. ?Thyroid disease. ?Being overweight. ?Kidney disease. ?Sometimes the cause is not known. ?What increases the risk? ?You are more likely to develop this condition if: ?You are older. ?You smoke. ?You exercise often and very hard. ?You have a family history of this condition. ?You are a man. ?You use drugs. ?You drink a lot of alcohol. ?You have lung conditions, such as emphysema, pneumonia, or COPD. ?You have sleep apnea. ?What are the signs or symptoms? ?Common symptoms of this condition include: ?A feeling that your heart is beating very fast. ?Chest pain or discomfort. ?Feeling short of breath. ?Suddenly feeling light-headed or weak. ?Getting tired easily during activity. ?Fainting. ?Sweating. ?In some cases, there are no symptoms. ?How is this treated? ?Treatment for this condition depends on underlying conditions and how you feel when you have atrial fibrillation. They include: ?Medicines to: ?Prevent blood clots. ?Treat heart rate or heart rhythm  problems. ?Using devices, such as a pacemaker, to correct heart rhythm problems. ?Doing surgery to remove the part of the heart that sends bad signals. ?Closing an area where clots can form in the heart (left atrial appendage). ?In some cases, your doctor  will treat other underlying conditions. ?Follow these instructions at home: ?Medicines ?Take over-the-counter and prescription medicines only as told by your doctor. ?Do not take any new medicines without first talking to your doctor. ?If you are taking blood thinners: ?Talk with your doctor before you take any medicines that have aspirin or NSAIDs, such as ibuprofen, in them. ?Take your medicine exactly as told by your doctor. Take it at the same time each day. ?Avoid activities that could hurt or bruise you. Follow instructions about how to prevent falls. ?Wear a bracelet that says you are taking blood thinners. Or, carry a card that lists what medicines you take. ?Lifestyle ? ?  ? ?Do not use any products that have nicotine or tobacco in them. These include cigarettes, e-cigarettes, and chewing tobacco. If you need help quitting, ask your doctor. ?Eat heart-healthy foods. Talk with your doctor about the right eating plan for you. ?Exercise regularly as told by your doctor. ?Do not drink alcohol. ?Lose weight if you are overweight. ?Do not use drugs, including cannabis. ?General instructions ?If you have a condition that causes breathing to stop for a short period of time (apnea), treat it as told by your doctor. ?Keep a healthy weight. Do not use diet pills unless your doctor says they are safe for you. Diet pills may make heart problems worse. ?Keep all follow-up visits as told by your doctor. This is important. ?Contact a doctor if: ?You notice a change in the speed, rhythm, or strength of your heartbeat. ?You are taking a blood-thinning medicine and you get more bruising. ?You get tired more easily when you move or exercise. ?You have a sudden change in weight. ?Get help right away if: ? ?You have pain in your chest or your belly (abdomen). ?You have trouble breathing. ?You have side effects of blood thinners, such as blood in your vomit, poop (stool), or pee (urine), or bleeding that cannot stop. ?You have  any signs of a stroke. "BE FAST" is an easy way to remember the main warning signs: ?B - Balance. Signs are dizziness, sudden trouble walking, or loss of balance. ?E - Eyes. Signs are trouble seeing or a change in how you see. ?F - Face. Signs are sudden weakness or loss of feeling in the face, or the face or eyelid drooping on one side. ?A - Arms. Signs are weakness or loss of feeling in an arm. This happens suddenly and usually on one side of the body. ?S - Speech. Signs are sudden trouble speaking, slurred speech, or trouble understanding what people say. ?T - Time. Time to call emergency services. Write down what time symptoms started. ?You have other signs of a stroke, such as: ?A sudden, very bad headache with no known cause. ?Feeling like you may vomit (nausea). ?Vomiting. ?A seizure. ?These symptoms may be an emergency. Do not wait to see if the symptoms will go away. Get medical help right away. Call your local emergency services (911 in the [place] Do not drive yourself to the hospital. ?Summary ?Atrial fibrillation is a type of heartbeat that is irregular or fast. ?You are at higher risk of this condition if you smoke, are older, have diabetes, or are overweight. ?Follow your doctor's instructions about  medicines, diet, exercise, and follow-up visits. ?Get help right away if you have signs or symptoms of a stroke. ?Get help right away if you cannot catch your breath, or you have chest pain or discomfort. ?This information is not intended to replace advice given to you by your health care provider. Make sure you discuss any questions you have with your health care provider. ?Document Revised: 08/21/2018 Document Reviewed: 08/21/2018 ?Elsevier Patient Education ? [employer]. ? ? ?Echocardiogram ?An echocardiogram is a test that uses sound waves (ultrasound) to produce images of the heart. ?Images from an echocardiogram can provide important information about: ?Heart size and shape. ?The size and  thickness and movement of your heart's walls. ?Heart muscle function and strength. ?Heart valve function or if you have stenosis. Stenosis is when the heart valves are too narrow. ?If blood is flowing backward throu

## 2021-07-28 ENCOUNTER — Encounter: Payer: Self-pay | Admitting: Cardiovascular Disease

## 2021-08-01 ENCOUNTER — Other Ambulatory Visit: Payer: Self-pay | Admitting: Family Medicine

## 2021-08-05 ENCOUNTER — Other Ambulatory Visit: Payer: Self-pay | Admitting: Family Medicine

## 2021-08-23 ENCOUNTER — Institutional Professional Consult (permissible substitution): Payer: Medicare Other | Admitting: Pulmonary Disease

## 2021-08-30 ENCOUNTER — Ambulatory Visit (INDEPENDENT_AMBULATORY_CARE_PROVIDER_SITE_OTHER): Payer: Medicare Other

## 2021-08-30 DIAGNOSIS — I4819 Other persistent atrial fibrillation: Secondary | ICD-10-CM

## 2021-08-30 LAB — ECHOCARDIOGRAM COMPLETE
AR max vel: 2.11 cm2
AV Area VTI: 1.91 cm2
AV Area mean vel: 1.92 cm2
AV Mean grad: 3 mmHg
AV Peak grad: 4.8 mmHg
Ao pk vel: 1.09 m/s
S' Lateral: 4 cm

## 2021-08-30 MED ORDER — PERFLUTREN LIPID MICROSPHERE
1.0000 mL | INTRAVENOUS | Status: AC | PRN
  Administered 2021-08-30: 2 mL via INTRAVENOUS

## 2021-08-30 NOTE — Progress Notes (Signed)
Office Visit    Patient Name: Anita Harrell Date of Encounter: 08/31/2021  Primary Care Provider:  Abner Greenspan, MD Primary Cardiologist:  [name] Rogue, MD Primary Electrophysiologist: None  Chief Complaint    Anita Harrell is a 71 y.o. female with PMH of persistent A-fib on Eliquis, HTN, tobacco abuse, obesity, presents today for 1 month follow-up of new onset atrial fibrillation.  Past Medical History    Past Medical History:  Diagnosis Date   Allergy    allergic rhinitis   Anxiety    Back pain    Chest pain    Depression    Epigastric pain    Goiter    History of tobacco abuse    Hyperglycemia    mild   Hyperlipidemia    Hypertension    Insomnia    Hx of   Labile blood pressure    Panic disorder    History of   Personal history of colonic polyps 06/09/2004   SVD (spontaneous vaginal delivery)    x 2   Past Surgical History:  Procedure Laterality Date   ABDOMINAL HYSTERECTOMY N/A 09/24/2012   Procedure: HYSTERECTOMY ABDOMINAL;  Surgeon: Gus Height, MD;  Location: [name];  Service: Gynecology;  Laterality: N/A;   BREAST BIOPSY     COLONOSCOPY     COLONOSCOPY  2017   ELBOW SURGERY  2004   EYE SURGERY     bilateral lasik   SALPINGOOPHORECTOMY Bilateral 09/24/2012   Procedure: SALPINGO OOPHORECTOMY;  Surgeon: Gus Height, MD;  Location: [facility];  Service: Gynecology;  Laterality: Bilateral;   THYROID SURGERY     B9 massess   WART FULGURATION N/A 09/24/2012   Procedure: FULGURATION VAGINAL WART;  Surgeon: Gus Height, MD;  Location: [facility];  Service: Gynecology;  Laterality: N/A;   WISDOM TOOTH EXTRACTION      Allergies  Allergies  Allergen Reactions   Codeine Nausea And Vomiting    REACTION: Nausea and vomiting Pt has tolerated vicodin & percocet in the past   Lipitor [Atorvastatin]     Muscle pain    Wellbutrin [Bupropion]     History of Present Illness    Anita Harrell is a 72 year old female with the above-mentioned past medical  history presents today for follow-up of atrial fibrillation.  She was last seen by Dr. Rockey Situ on 07/27/2021 following referral from pulmonology for new onset atrial fibrillation.  She was started on Eliquis 5 mg twice daily.  EKG during office visit revealed AF with heart rate of 114.  Her metoprolol succinate was increased to 50 mg twice daily and her lisinopril HCTZ was held to prevent bradycardia.  2D echo was ordered however has not been completed.  Chest CT was completed 07/2021 for persistent cough revealed 8 mm pulmonary nodule that was stable compared to CT in 02/2019. Arteriolosclerotic calcifications were also noted in thoracic aorta. She was not volume overloaded on examination and antiarrhythmic medications were held off for the time being.  She was noted to have increased shortness of breath but was thought to be related to general deconditioning.  Since last being seen in the office patient reports today that she is feeling better but still endorses some fatigue and dyspnea with exertion.  She has no bleeding concerns on Eliquis and has not experienced any dizziness with metoprolol. Today she denies chest pain, PND, orthopnea, nausea, vomiting, dizziness, syncope, edema, weight gain, or early satiety.  Home Medications    Current Outpatient  Medications  Medication Sig Dispense Refill   albuterol (VENTOLIN HFA) 108 (90 Base) MCG/ACT inhaler INHALE 2 PUFFS INTO THE LUNGS EVERY 6 HOURS AS NEEDED FOR COUGH. 18 g 3   apixaban (ELIQUIS) 5 MG TABS tablet Take 1 tablet (5 mg total) by mouth 2 (two) times daily. 60 tablet 1   benzonatate (TESSALON) 200 MG capsule Take 1 capsule (200 mg total) by mouth 3 (three) times daily as needed. Swallow whole 30 capsule 1   busPIRone (BUSPAR) 30 MG tablet TAKE ONE HALF TO 1 TABLET BY MOUTH 3 TIMES DAILY AS DIRECTED 270 tablet 1   DULoxetine (CYMBALTA) 60 MG capsule Take 1 capsule (60 mg total) by mouth daily. 90 capsule 3   HYDROcodone bit-homatropine (HYCODAN)  5-1.5 MG/5ML syrup TAKE FIVE MLS (1 TEASPOONFUL) BY MOUTH AS NEEDED 60 mL 0   metoprolol succinate (TOPROL-XL) 50 MG 24 hr tablet TAKE 1 TABLET (50 mg)  BY MOUTH TWICE DAILY (TAKE WITH OR IMMEDIATELY FOLLOWING A MEAL) 180 tablet 0   rosuvastatin (CRESTOR) 10 MG tablet TAKE 1 TABLET BY MOUTH ONCE A DAY 90 tablet 0   solifenacin (VESICARE) 10 MG tablet TAKE 1 TABLET BY MOUTH ONCE A DAY 90 tablet 0   zolpidem (AMBIEN CR) 12.5 MG CR tablet TAKE 1 TABLET BY MOUTH ONCE DAILY AT BEDTIME AS NEEDED FOR SLEEP 90 tablet 0   No current facility-administered medications for this visit.     Review of Systems  Please see the history of present illness.    (+)Fatigue  (+)Exertional dyspnea  All other systems reviewed and are otherwise negative except as noted above.  Physical Exam    Wt Readings from Last 3 Encounters:  08/31/21 204 lb (92.5 kg)  07/27/21 202 lb 8 oz (91.9 kg)  07/26/21 203 lb 3.2 oz (92.2 kg)   VS: Vitals:   08/31/21 1430  BP: 112/70  Pulse: 82  SpO2: 94%  ,Body mass index is 35.57 kg/m.   Constitutional:      Appearance: Healthy appearance. Not in distress.  Neck:     Vascular: JVD normal.  Pulmonary:     Effort: Pulmonary effort is normal.     Breath sounds: No wheezing. No rales. Diminished in the bases Cardiovascular:     Normal rate. irregular regular. Normal S1. Normal S2.      Murmurs: There is no murmur.  Edema:    Peripheral edema absent.  Abdominal:     Palpations: Abdomen is soft non tender. There is no hepatomegaly.  Skin:    General: Skin is warm and dry.  Neurological:     General: No focal deficit present.     Mental Status: Alert and oriented to person, place and time.     Cranial Nerves: Cranial nerves are intact.  EKG/LABS/Other Studies Reviewed    ECG personally reviewed by me today - Sinus with frequent PAC's and heart rate of 83 with no acute changes  Risk Assessment/Calculations:    CHA2DS2-VASc Score = 4   This indicates a 4.8%  annual risk of stroke. The patient's score is based upon: CHF History: 0 HTN History: 1 Diabetes History: 0 Stroke History: 0 Vascular Disease History: 1 Age Score: 1 Gender Score: 1     STOP-Bang Score:  6        Lab Results  Component Value Date   WBC 12.2 (H) 06/07/2021   HGB 14.6 06/07/2021   HCT 42.7 06/07/2021   MCV 87.4 06/07/2021   PLT 156.0  06/07/2021   Lab Results  Component Value Date   CREATININE 0.66 06/07/2021   BUN 12 06/07/2021   NA 137 06/07/2021   K 4.1 06/07/2021   CL 102 06/07/2021   CO2 26 06/07/2021   Lab Results  Component Value Date   ALT 17 06/07/2021   AST 18 06/07/2021   ALKPHOS 75 06/07/2021   BILITOT 0.6 06/07/2021   Lab Results  Component Value Date   CHOL 118 06/07/2021   HDL 41.30 06/07/2021   LDLCALC 37 06/07/2021   LDLDIRECT 73.0 08/21/2014   TRIG 197.0 (H) 06/07/2021   CHOLHDL 3 06/07/2021    Lab Results  Component Value Date   HGBA1C 6.1 06/07/2021    Assessment & Plan    1.  Persistent atrial fibrillation/SOB: -EKG today was sinus rhythm with PACs and no atrial fibrillation -Patient does endorse some continued dyspnea with exertion but denies any chest pain or presyncope. -Continue rate control with Toprol XL 50 mg daily  -Patient denies any bleeding issues at this time  -Continue Eliquis 5 mg twice daily -BMET and CBC today  2.  Preoperative Clearance: -Patient has planned thyroid biopsy procedure next month  Anita Harrell perioperative risk of a major cardiac event is  % according to the Revised Cardiac Risk Index (RCRI).  Therefore, she is at low risk for perioperative complications.   Her functional capacity is good at 5.07 METs according to the Duke Activity Status Index (DASI). Recommendations: According to ACC/AHA guidelines, no further cardiovascular testing needed.  The patient may proceed to surgery at acceptable risk.   Antiplatelet and/or Anticoagulation Recommendations:  Due to no DCCV needed she  may hold Eliquis for 24/48 hours prior to surgery   3.  Hyperlipidemia: -Last LDL was 37 at goal of less than 70 -Continue Crestor 10 mg daily -Continue low-sodium heart healthy diet  4.  Essential hypertension: -Blood pressure today was 112/70 at goal of less than 130/80.  I discussed the importance of strict blood pressure control in preventing atrial fibrillation with the patient today -Continue metoprolol succinate 50 mg daily  6.  Sleep disordered breathing: -Patient STOP-BANG is 6 -We will order [facility] sleep study to evaluate possible sleep apnea  Disposition: Follow-up with [name] Rogue, MD or APP in 3 months    Medication Adjustments/Labs and Tests Ordered: Current medicines are reviewed at length with the patient today.  Concerns regarding medicines are outlined above.   Signed, Mable Fill, Marissa Nestle, NP 08/31/2021, 3:55 PM [place]

## 2021-08-31 ENCOUNTER — Encounter: Payer: Self-pay | Admitting: Nurse Practitioner

## 2021-08-31 ENCOUNTER — Other Ambulatory Visit
Admission: RE | Admit: 2021-08-31 | Discharge: 2021-08-31 | Disposition: A | Discharge status: Home / Self Care | Payer: Medicare Other | Source: Ambulatory Visit | Attending: Nurse Practitioner | Admitting: Nurse Practitioner

## 2021-08-31 ENCOUNTER — Telehealth: Payer: Self-pay | Admitting: *Deleted

## 2021-08-31 ENCOUNTER — Other Ambulatory Visit: Payer: Self-pay | Admitting: Family Medicine

## 2021-08-31 ENCOUNTER — Ambulatory Visit (INDEPENDENT_AMBULATORY_CARE_PROVIDER_SITE_OTHER): Payer: Medicare Other | Admitting: Nurse Practitioner

## 2021-08-31 VITALS — BP 112/70 | HR 82 | Ht 63.5 in | Wt 204.0 lb

## 2021-08-31 DIAGNOSIS — I4819 Other persistent atrial fibrillation: Secondary | ICD-10-CM

## 2021-08-31 DIAGNOSIS — R0602 Shortness of breath: Secondary | ICD-10-CM | POA: Insufficient documentation

## 2021-08-31 DIAGNOSIS — G4730 Sleep apnea, unspecified: Secondary | ICD-10-CM | POA: Diagnosis not present

## 2021-08-31 DIAGNOSIS — E7800 Pure hypercholesterolemia, unspecified: Secondary | ICD-10-CM | POA: Diagnosis not present

## 2021-08-31 DIAGNOSIS — I10 Essential (primary) hypertension: Secondary | ICD-10-CM | POA: Diagnosis not present

## 2021-08-31 DIAGNOSIS — Z01810 Encounter for preprocedural cardiovascular examination: Secondary | ICD-10-CM | POA: Diagnosis not present

## 2021-08-31 DIAGNOSIS — R400 Somnolence: Secondary | ICD-10-CM | POA: Diagnosis not present

## 2021-08-31 LAB — CBC
HCT: 39.3 % (ref 36.0–46.0)
Hemoglobin: 13.5 g/dL (ref 12.0–15.0)
MCH: 29.9 pg (ref 26.0–34.0)
MCHC: 34.4 g/dL (ref 30.0–36.0)
MCV: 86.9 fL (ref 80.0–100.0)
Platelets: 120 10*3/uL — ABNORMAL LOW (ref 150–400)
RBC: 4.52 MIL/uL (ref 3.87–5.11)
RDW: 12.7 % (ref 11.5–15.5)
WBC: 10.9 10*3/uL — ABNORMAL HIGH (ref 4.0–10.5)
nRBC: 0 % (ref 0.0–0.2)

## 2021-08-31 LAB — BASIC METABOLIC PANEL
Anion gap: 7 (ref 5–15)
BUN: 11 mg/dL (ref 8–23)
CO2: 25 mmol/L (ref 22–32)
Calcium: 9.4 mg/dL (ref 8.9–10.3)
Chloride: 106 mmol/L (ref 98–111)
Creatinine, Ser: 0.57 mg/dL (ref 0.44–1.00)
GFR, Estimated: >60 mL/min (ref >60)
Glucose, Bld: 110 mg/dL — ABNORMAL HIGH (ref 70–99)
Potassium: 3.9 mmol/L (ref 3.5–5.1)
Sodium: 138 mmol/L (ref 135–145)

## 2021-08-31 NOTE — Patient Instructions (Signed)
Medication Instructions:   Your physician recommends that you continue on your current medications as directed. Please refer to the Current Medication list given to you today.  *If you need a refill on your cardiac medications before your next appointment, please call your pharmacy*   Lab Work:  Today at the medical mall: BMET, CBC  -  Please go to the [facility] in at the Registration Desk: 1st desk to the right, past the screening table  If you have labs (blood work) drawn today and your tests are completely normal, you will receive your results only by: [place] (if you have MyChart) OR A paper copy in the mail If you have any lab test that is abnormal or we need to change your treatment, we will call you to review the results.   Testing/Procedures:  Your provider has recommended that you complete a home sleep study: [place] [facility] Please follow instructions on handout given. Please keep device box closed and sealed until our office has notified you that insurance has authorized test.  Our office will then give you a 4 digit PIN code that you will use when you initiate your home test.    Follow-Up: At [facility], you and your health needs are our priority.  As part of our continuing mission to provide you with exceptional heart care, we have created designated Provider Care Teams.  These Care Teams include your primary Cardiologist (physician) and Advanced Practice Providers (APPs -  Physician Assistants and Nurse Practitioners) who all work together to provide you with the care you need, when you need it.  We recommend signing up for the patient portal called "MyChart".  Sign up information is provided on this After Visit Summary.  MyChart is used to connect with patients for Virtual Visits (Telemedicine).  Patients are able to view lab/test results, encounter notes, upcoming appointments, etc.  Non-urgent messages can be sent to your provider  as well.   To learn more about what you can do with MyChart, go to [email or web address].    Your next appointment:   3 month(s)  The format for your next appointment:   In Person  Provider:   You may see Dr. [name] Rogue or one of the following Advanced Practice Providers on your designated Care Team:   Murray Hodgkins, NP Christell Faith, PA-C Cadence Kathlen Mody, PA-C{   Important Information About [place]

## 2021-08-31 NOTE — Telephone Encounter (Signed)
Prior Authorization for [facility] sent to MEDICARE via Fax . NO PA REQUIRED.

## 2021-09-01 ENCOUNTER — Encounter: Payer: Self-pay | Admitting: Cardiovascular Disease

## 2021-09-01 NOTE — Telephone Encounter (Signed)
Error closing encounter.  

## 2021-09-02 NOTE — Telephone Encounter (Signed)
Will forward to [place] office as the pt was set up with [facility] there.

## 2021-09-05 ENCOUNTER — Other Ambulatory Visit: Payer: Self-pay | Admitting: Family Medicine

## 2021-09-17 ENCOUNTER — Other Ambulatory Visit: Payer: Self-pay | Admitting: Family Medicine

## 2021-09-17 ENCOUNTER — Other Ambulatory Visit: Payer: Self-pay | Admitting: Pulmonary Disease

## 2021-09-19 NOTE — Telephone Encounter (Signed)
Refill request for ZOLPIDEM TARTRATE ER 12.5 MG TAB  LOV - 06/21/21 Next OV - not scheduled Last refill - 06/27/21 #90/0

## 2021-09-20 NOTE — Telephone Encounter (Signed)
Last filled 06/28/21 Last ov 06/21/21

## 2021-09-20 NOTE — Telephone Encounter (Signed)
Does she need this early? Is she traveling?

## 2021-09-21 DIAGNOSIS — R441 Visual hallucinations: Secondary | ICD-10-CM | POA: Diagnosis not present

## 2021-09-21 DIAGNOSIS — Z9181 History of falling: Secondary | ICD-10-CM | POA: Diagnosis not present

## 2021-09-21 DIAGNOSIS — E042 Nontoxic multinodular goiter: Secondary | ICD-10-CM | POA: Diagnosis not present

## 2021-09-21 NOTE — Telephone Encounter (Signed)
When she goes to pick it up she will need to let them know she is traveling to get some early  Thanks

## 2021-09-21 NOTE — Telephone Encounter (Signed)
Called and  a detail vm advising pt of this.

## 2021-09-21 NOTE — Telephone Encounter (Signed)
Spoke with pt she is leaving out on this Friday to go to her beach [place]. And will be out of town

## 2021-09-26 ENCOUNTER — Telehealth: Payer: Self-pay | Admitting: Family Medicine

## 2021-09-26 NOTE — Telephone Encounter (Signed)
This is for afib   I will forward to her cardiology Dr Rockey Situ for her eliquis  (thanks in advance for input)

## 2021-09-26 NOTE — Telephone Encounter (Signed)
How long did you want her to hold this for

## 2021-09-26 NOTE — Telephone Encounter (Signed)
Cascy from [facility] called about patient and stated that patient needs to hold on taking blood thinner before her appointment at the dentist office. Call back number [phone].

## 2021-09-27 NOTE — Telephone Encounter (Signed)
Called patient's dentist office to let them know that they needed to send [place] a clearance request. They advised that they had yesterday. I asked them to refax it. They then informed me that patient was coming in in 2 hours.   I asked what patient was having done and they stated that they didn't know yet, as patient had just called with a toothache.   I spoke with MD and MD stated that she did not need to hold Eliquis.   Spoke with Myriam Jacobson at dentist office and advised her of this.

## 2021-10-05 ENCOUNTER — Ambulatory Visit (INDEPENDENT_AMBULATORY_CARE_PROVIDER_SITE_OTHER): Payer: Medicare Other | Admitting: Family Medicine

## 2021-10-05 ENCOUNTER — Encounter: Payer: Self-pay | Admitting: Family Medicine

## 2021-10-05 VITALS — BP 128/78 | HR 101 | Ht 63.5 in | Wt 202.2 lb

## 2021-10-05 DIAGNOSIS — I4891 Unspecified atrial fibrillation: Secondary | ICD-10-CM | POA: Insufficient documentation

## 2021-10-05 DIAGNOSIS — M7061 Trochanteric bursitis, right hip: Secondary | ICD-10-CM | POA: Diagnosis not present

## 2021-10-05 DIAGNOSIS — M7072 Other bursitis of hip, left hip: Secondary | ICD-10-CM | POA: Insufficient documentation

## 2021-10-05 DIAGNOSIS — M7062 Trochanteric bursitis, left hip: Secondary | ICD-10-CM

## 2021-10-05 DIAGNOSIS — M7071 Other bursitis of hip, right hip: Secondary | ICD-10-CM | POA: Insufficient documentation

## 2021-10-05 DIAGNOSIS — I10 Essential (primary) hypertension: Secondary | ICD-10-CM | POA: Diagnosis not present

## 2021-10-05 DIAGNOSIS — F513 Sleepwalking [somnambulism]: Secondary | ICD-10-CM | POA: Diagnosis not present

## 2021-10-05 DIAGNOSIS — I4819 Other persistent atrial fibrillation: Secondary | ICD-10-CM | POA: Diagnosis not present

## 2021-10-05 NOTE — Assessment & Plan Note (Signed)
bp in fair control at this time  BP Readings from Last 1 Encounters:  10/05/21 128/78   No changes needed Most recent labs reviewed  Disc lifstyle change with low sodium diet and exercise  Metoprolol xl 50 mg daily (for a fib also)

## 2021-10-05 NOTE — Assessment & Plan Note (Signed)
With vivid dreams Bothersome to pt  No hallucinations however Disc possible OSA making this worse  Enc she follow through with sleep study arranged by cardiology

## 2021-10-05 NOTE — Patient Instructions (Addendum)
Call your cardiologist about the chest symptoms    Get in with your orthopedic doctor as well   Go ahead and do the sleep study-this may give [place] some insight into the sleep walking and vivid dreams Sleep apnea can make it worse, you may need treatment   Do what you need to do to be safe

## 2021-10-05 NOTE — Assessment & Plan Note (Signed)
Not well controlled Cannot take nsaids with the eliquis  Enc use of ice and f/u with ortho  May need PT or injection

## 2021-10-05 NOTE — Assessment & Plan Note (Signed)
Under care of cardiology  occ sob/exercise intolerant  Not a lot of palpitations  Enc her to follow up with cardiology if symptoms continue Taking eliquis and metoprolol

## 2021-10-05 NOTE — Progress Notes (Signed)
Subjective:    Patient ID: Anita Harrell, female    DOB: [birth year], 71 y.o.   MRN: [id number]  HPI Pt presents to discuss chronic medical problems   Wt Readings from Last 3 Encounters:  10/05/21 202 lb 3.2 oz (91.7 kg)  08/31/21 204 lb (92.5 kg)  07/27/21 202 lb 8 oz (91.9 kg)   35.26 kg/m  Hips are bothering her / bursitis in hips  Back -arthritis  Hurts over the lateral hip area both sides  Needs a handicapped sticker   Has shot in R  Both bother her  Cannot take anything but tylenol     Has planned thyroid bx next month    A fib  Seeing cardiology  Has been through "hell"   Has pains in her mid chest (pain comes and goes)  Not really fluttering    BP Readings from Last 3 Encounters:  10/05/21 128/78  08/31/21 112/70  07/27/21 128/78   Pulse Readings from Last 3 Encounters:  10/05/21 (!) 101  08/31/21 82  07/27/21 (!) 114   Eliquis Metoprolol xl 50 mg   Crestor : for lipids/ stopped it for now  Lab Results  Component Value Date   CHOL 118 06/07/2021   HDL 41.30 06/07/2021   LDLCALC 37 06/07/2021   LDLDIRECT 73.0 08/21/2014   TRIG 197.0 (H) 06/07/2021   CHOLHDL 3 06/07/2021    Sleep study was ordered   Very vivid dreams  Sees people - sometimes a few seconds after lights come on  She sleep walks also - much more than she used to    Cardiology did discuss sleep study    Patient Active Problem List   Diagnosis Date Noted   Bilateral hip bursitis 10/05/2021   Atrial fibrillation, persistent ([place]) 10/05/2021   Sleep walking 10/05/2021   Cerumen impaction 06/21/2021   Estrogen deficiency 06/07/2021   Fatigue 06/21/2020   Overactive bladder 10/17/2019   Medicare annual wellness visit, subsequent 01/16/2019   Thyroid nodule 05/01/2018   Electronic cigarette use 04/03/2018   Obesity (BMI 30-39.9) 04/03/2018   Lung mass 02/01/2018   Abnormal chest x-ray 01/29/2018   Smokers' cough ([place]) 01/01/2018   Tachycardia 11/01/2015    Microscopic hematuria 08/13/2012   Routine general medical examination at a health care facility 07/22/2012   Leukocytosis 03/30/2010   Prediabetes 02/21/2008   HYPERTENSION, BENIGN ESSENTIAL 01/22/2008   ALLERGIC RHINITIS 07/25/2007   BACK PAIN 06/26/2007   Personal history of goiter 07/27/2006   Hyperlipidemia 07/27/2006   PANIC DISORDER 07/27/2006   Former smoker 07/27/2006   Depression with anxiety 07/27/2006   Insomnia 07/27/2006   Personal history of colonic polyps 06/09/2004   Past Medical History:  Diagnosis Date   Allergy    allergic rhinitis   Anxiety    Back pain    Chest pain    Depression    Epigastric pain    Goiter    History of tobacco abuse    Hyperglycemia    mild   Hyperlipidemia    Hypertension    Insomnia    Hx of   Labile blood pressure    Panic disorder    History of   Personal history of colonic polyps 06/09/2004   SVD (spontaneous vaginal delivery)    x 2   Past Surgical History:  Procedure Laterality Date   ABDOMINAL HYSTERECTOMY N/A 09/24/2012   Procedure: HYSTERECTOMY ABDOMINAL;  Surgeon: Gus Height, MD;  Location: [facility];  Service: Gynecology;  Laterality: N/A;  BREAST BIOPSY     COLONOSCOPY     COLONOSCOPY  2017   ELBOW SURGERY  2004   EYE SURGERY     bilateral lasik   SALPINGOOPHORECTOMY Bilateral 09/24/2012   Procedure: SALPINGO OOPHORECTOMY;  Surgeon: Gus Height, MD;  Location: [facility];  Service: Gynecology;  Laterality: Bilateral;   THYROID SURGERY     B9 massess   WART FULGURATION N/A 09/24/2012   Procedure: FULGURATION VAGINAL WART;  Surgeon: Gus Height, MD;  Location: [facility];  Service: Gynecology;  Laterality: N/A;   WISDOM TOOTH EXTRACTION     Social History   Tobacco Use   Smoking status: Former    Packs/day: 1.00    Years: 20.00    Total pack years: 20.00    Types: Cigarettes, E-cigarettes    Quit date: 12/11/2008    Years since quitting: 12.8   Smokeless tobacco: Never   Tobacco comments:    She is vaping    Vaping Use   Vaping Use: Every day  Substance Use Topics   Alcohol use: No    Alcohol/week: 0.0 standard drinks of alcohol   Drug use: No   Family History  Problem Relation Age of Onset   Hypertension Mother    Stroke Mother    Alcohol abuse Father    Cancer Father        lung CA   Heart disease Father        CHF   Cancer Sister        breast CA   Heart disease Sister        CHF from chemotx also has defib   Breast cancer Sister    Colon cancer Neg Hx    Esophageal cancer Neg Hx    Rectal cancer Neg Hx    Stomach cancer Neg Hx    Allergies  Allergen Reactions   Codeine Nausea And Vomiting    REACTION: Nausea and vomiting Pt has tolerated vicodin & percocet in the past   Lipitor [Atorvastatin]     Muscle pain    Wellbutrin [Bupropion]    Current Outpatient Medications on File Prior to Visit  Medication Sig Dispense Refill   albuterol (VENTOLIN HFA) 108 (90 Base) MCG/ACT inhaler INHALE 2 PUFFS INTO THE LUNGS EVERY 6 HOURS AS NEEDED FOR COUGH. 18 g 3   busPIRone (BUSPAR) 30 MG tablet TAKE ONE HALF TO 1 TABLET BY MOUTH 3 TIMES DAILY AS DIRECTED 270 tablet 1   DULoxetine (CYMBALTA) 60 MG capsule Take 1 capsule (60 mg total) by mouth daily. 90 capsule 3   ELIQUIS 5 MG TABS tablet TAKE 1 TABLET BY MOUTH TWICE A DAY 60 tablet 1   metoprolol succinate (TOPROL-XL) 50 MG 24 hr tablet TAKE 1 TABLET (50 mg)  BY MOUTH TWICE DAILY (TAKE WITH OR IMMEDIATELY FOLLOWING A MEAL) 180 tablet 0   rosuvastatin (CRESTOR) 10 MG tablet TAKE 1 TABLET BY MOUTH ONCE A DAY 90 tablet 0   solifenacin (VESICARE) 10 MG tablet TAKE 1 TABLET BY MOUTH ONCE A DAY 90 tablet 0   zolpidem (AMBIEN CR) 12.5 MG CR tablet TAKE 1 TABLET BY MOUTH ONCE DAILY AT BEDTIME AS NEEDED FOR SLEEP 90 tablet 0   No current facility-administered medications on file prior to visit.     Review of Systems  Constitutional:  Positive for fatigue. Negative for activity change, appetite change, fever and unexpected weight change.   HENT:  Negative for congestion, ear pain, rhinorrhea, sinus pressure and sore throat.  Eyes:  Negative for pain, redness and visual disturbance.  Respiratory:  Positive for shortness of breath. Negative for cough and wheezing.        Sob with exertion   Cardiovascular:  Positive for chest pain. Negative for palpitations and leg swelling.  Gastrointestinal:  Negative for abdominal pain, blood in stool, constipation and diarrhea.  Endocrine: Negative for polydipsia and polyuria.  Genitourinary:  Negative for dysuria, frequency and urgency.  Musculoskeletal:  Negative for arthralgias, back pain and myalgias.  Skin:  Negative for pallor and rash.  Allergic/Immunologic: Negative for environmental allergies.  Neurological:  Negative for dizziness, syncope and headaches.  Hematological:  Negative for adenopathy. Does not bruise/bleed easily.  Psychiatric/Behavioral:  Positive for dysphoric mood and sleep disturbance. Negative for decreased concentration and self-injury. The patient is nervous/anxious.        Objective:   Physical Exam Constitutional:      General: She is not in acute distress.    Appearance: Normal appearance. She is well-developed. She is obese. She is not ill-appearing or diaphoretic.  HENT:     Head: Normocephalic and atraumatic.  Eyes:     Conjunctiva/sclera: Conjunctivae normal.     Pupils: Pupils are equal, round, and reactive to light.  Neck:     Thyroid: No thyromegaly.     Vascular: No carotid bruit or JVD.  Cardiovascular:     Rate and Rhythm: Tachycardia present. Rhythm irregular.     Heart sounds: Normal heart sounds.     No gallop.     Comments: Mild tachycardia  Pulmonary:     Effort: Pulmonary effort is normal. No respiratory distress.     Breath sounds: Normal breath sounds. No wheezing or rales.  Abdominal:     General: There is no distension or abdominal bruit.     Palpations: Abdomen is soft.  Musculoskeletal:     Cervical back: Normal range  of motion and neck supple.     Right lower leg: No edema.     Left lower leg: No edema.     Comments: Limited rom of hips Pain and stiffness with walking Tender over greater trochanter bilat  Lymphadenopathy:     Cervical: No cervical adenopathy.  Skin:    General: Skin is warm and dry.     Coloration: Skin is not pale.     Findings: No rash.  Neurological:     Mental Status: She is alert.     Coordination: Coordination normal.     Deep Tendon Reflexes: Reflexes are normal and symmetric. Reflexes normal.  Psychiatric:        Attention and Perception: Attention normal.        Mood and Affect: Mood is anxious and depressed. Affect is tearful.        Cognition and Memory: Cognition and memory normal.     Comments: Candidly discusses symptoms and stressors             Assessment & Plan:   Problem List Items Addressed This Visit       Cardiovascular and Mediastinum   Atrial fibrillation, persistent ([place])    Under care of cardiology  occ sob/exercise intolerant  Not a lot of palpitations  Enc her to follow up with cardiology if symptoms continue Taking eliquis and metoprolol       HYPERTENSION, BENIGN ESSENTIAL    bp in fair control at this time  BP Readings from Last 1 Encounters:  10/05/21 128/78  No changes needed Most recent labs  reviewed  Disc lifstyle change with low sodium diet and exercise  Metoprolol xl 50 mg daily (for a fib also)        Nervous and Auditory   Sleep walking    With vivid dreams Bothersome to pt  No hallucinations however Disc possible OSA making this worse  Enc she follow through with sleep study arranged by cardiology        Musculoskeletal and Integument   Bilateral hip bursitis - Primary    Not well controlled Cannot take nsaids with the eliquis  Enc use of ice and f/u with ortho  May need PT or injection

## 2021-10-12 ENCOUNTER — Telehealth: Payer: Self-pay | Admitting: Family Medicine

## 2021-10-12 MED ORDER — PAROXETINE HCL 40 MG PO TABS: 60.0000 mg | ORAL_TABLET | ORAL | 3 refills | Status: DC

## 2021-10-12 NOTE — Telephone Encounter (Signed)
Forwarding

## 2021-10-12 NOTE — Telephone Encounter (Signed)
Scheduled for 2 weeks and informed patient

## 2021-10-12 NOTE — Telephone Encounter (Signed)
Patient called in and said she would really like to switch from DULoxetine (CYMBALTA) 60 MG capsule back to [place]. She would like a call back at [phone]. She did say she does have some paxel left. She said she thinks the duloxetine is harming more than its helping, she said she has been crying everyday

## 2021-10-12 NOTE — Telephone Encounter (Signed)
I sent the paxil to [facility]  Please follow up in about 2 weeks here unless already scheduled

## 2021-10-26 ENCOUNTER — Ambulatory Visit (INDEPENDENT_AMBULATORY_CARE_PROVIDER_SITE_OTHER): Payer: Medicare Other | Admitting: Family Medicine

## 2021-10-26 ENCOUNTER — Encounter: Payer: Self-pay | Admitting: Family Medicine

## 2021-10-26 VITALS — BP 110/76 | HR 114 | Ht 63.5 in | Wt 203.2 lb

## 2021-10-26 DIAGNOSIS — F418 Other specified anxiety disorders: Secondary | ICD-10-CM

## 2021-10-26 DIAGNOSIS — I10 Essential (primary) hypertension: Secondary | ICD-10-CM | POA: Diagnosis not present

## 2021-10-26 MED ORDER — FAMOTIDINE 20 MG PO TABS: 20.0000 mg | ORAL_TABLET | Freq: Every day | ORAL | 3 refills | Status: DC

## 2021-10-26 NOTE — Progress Notes (Signed)
Subjective:    Patient ID: Anita Harrell, female    DOB: 10-05-1950, 71 y.o.   MRN: [id number]  HPI Pt presents for f/u of anxiety , HTN , a fib and chronic health problems   Wt Readings from Last 3 Encounters:  10/26/21 203 lb 3.2 oz (92.2 kg)  10/05/21 202 lb 3.2 oz (91.7 kg)  08/31/21 204 lb (92.5 kg)   35.43 kg/m  Pt noted that she did not improve when going from paxil to cymbalta  Was crying every day Wanted to change that and is doing some better    Not crying any more  Anxiety is always high  Every am has fear and a stomach ache   Not suicidal  Does not think about it   Buspar 15-30 mg tid  Paxil 60 mg daily   Thinks she is ready for a psychiatrist    Last visit was struggling with a fib symptoms   Also hip bursitis   We discussed a sleep study - was c/o sleep walking and vivit dreams   HTN bp is stable today  No cp or palpitations or headaches or edema  No side effects to medicines  BP Readings from Last 3 Encounters:  10/26/21 110/76  10/05/21 128/78  08/31/21 112/70     Metoprolol xl 50 mg daily   Pulse Readings from Last 3 Encounters:  10/26/21 (!) 114  10/05/21 (!) 101  08/31/21 82   Stays more tired  No palpitations  Chest pain is less frequent    Saw Dr Buddy Duty for endocrinology for thyroid-  ? Need for thyroid bx  Does not remember what the recommendation was   Patient Active Problem List   Diagnosis Date Noted   Bilateral hip bursitis 10/05/2021   Atrial fibrillation, persistent ([place]) 10/05/2021   Sleep walking 10/05/2021   Cerumen impaction 06/21/2021   Estrogen deficiency 06/07/2021   Fatigue 06/21/2020   Overactive bladder 10/17/2019   Medicare annual wellness visit, subsequent 01/16/2019   Thyroid nodule 05/01/2018   Electronic cigarette use 04/03/2018   Obesity (BMI 30-39.9) 04/03/2018   Lung mass 02/01/2018   Abnormal chest x-ray 01/29/2018   Smokers' cough ([place]) 01/01/2018   Tachycardia 11/01/2015   Microscopic  hematuria 08/13/2012   Routine general medical examination at a health care facility 07/22/2012   Leukocytosis 03/30/2010   Prediabetes 02/21/2008   HYPERTENSION, BENIGN ESSENTIAL 01/22/2008   ALLERGIC RHINITIS 07/25/2007   BACK PAIN 06/26/2007   Personal history of goiter 07/27/2006   Hyperlipidemia 07/27/2006   PANIC DISORDER 07/27/2006   Former smoker 07/27/2006   Depression with anxiety 07/27/2006   Insomnia 07/27/2006   Personal history of colonic polyps 06/09/2004   Past Medical History:  Diagnosis Date   Allergy    allergic rhinitis   Anxiety    Back pain    Chest pain    Depression    Epigastric pain    Goiter    History of tobacco abuse    Hyperglycemia    mild   Hyperlipidemia    Hypertension    Insomnia    Hx of   Labile blood pressure    Panic disorder    History of   Personal history of colonic polyps 06/09/2004   SVD (spontaneous vaginal delivery)    x 2   Past Surgical History:  Procedure Laterality Date   ABDOMINAL HYSTERECTOMY N/A 09/24/2012   Procedure: HYSTERECTOMY ABDOMINAL;  Surgeon: Gus Height, MD;  Location: [facility];  Service:  Gynecology;  Laterality: N/A;   BREAST BIOPSY     COLONOSCOPY     COLONOSCOPY  2017   ELBOW SURGERY  2004   EYE SURGERY     bilateral lasik   SALPINGOOPHORECTOMY Bilateral 09/24/2012   Procedure: SALPINGO OOPHORECTOMY;  Surgeon: Gus Height, MD;  Location: [facility];  Service: Gynecology;  Laterality: Bilateral;   THYROID SURGERY     B9 massess   WART FULGURATION N/A 09/24/2012   Procedure: FULGURATION VAGINAL WART;  Surgeon: Gus Height, MD;  Location: [facility];  Service: Gynecology;  Laterality: N/A;   WISDOM TOOTH EXTRACTION     Social History   Tobacco Use   Smoking status: Former    Packs/day: 1.00    Years: 20.00    Total pack years: 20.00    Types: Cigarettes, E-cigarettes    Quit date: 12/11/2008    Years since quitting: 12.8   Smokeless tobacco: Never   Tobacco comments:    She is vaping   Vaping Use    Vaping Use: Every day  Substance Use Topics   Alcohol use: No    Alcohol/week: 0.0 standard drinks of alcohol   Drug use: No   Family History  Problem Relation Age of Onset   Hypertension Mother    Stroke Mother    Alcohol abuse Father    Cancer Father        lung CA   Heart disease Father        CHF   Cancer Sister        breast CA   Heart disease Sister        CHF from chemotx also has defib   Breast cancer Sister    Colon cancer Neg Hx    Esophageal cancer Neg Hx    Rectal cancer Neg Hx    Stomach cancer Neg Hx    Allergies  Allergen Reactions   Codeine Nausea And Vomiting    REACTION: Nausea and vomiting Pt has tolerated vicodin & percocet in the past   Cymbalta [Duloxetine Hcl]     More depressed/tearful    Lipitor [Atorvastatin]     Muscle pain    Wellbutrin [Bupropion]    Current Outpatient Medications on File Prior to Visit  Medication Sig Dispense Refill   albuterol (VENTOLIN HFA) 108 (90 Base) MCG/ACT inhaler INHALE 2 PUFFS INTO THE LUNGS EVERY 6 HOURS AS NEEDED FOR COUGH. 18 g 3   busPIRone (BUSPAR) 30 MG tablet TAKE ONE HALF TO 1 TABLET BY MOUTH 3 TIMES DAILY AS DIRECTED 270 tablet 1   ELIQUIS 5 MG TABS tablet TAKE 1 TABLET BY MOUTH TWICE A DAY 60 tablet 1   metoprolol succinate (TOPROL-XL) 50 MG 24 hr tablet TAKE 1 TABLET (50 mg)  BY MOUTH TWICE DAILY (TAKE WITH OR IMMEDIATELY FOLLOWING A MEAL) 180 tablet 0   PARoxetine (PAXIL) 40 MG tablet Take 1.5 tablets (60 mg total) by mouth every morning. 135 tablet 3   rosuvastatin (CRESTOR) 10 MG tablet TAKE 1 TABLET BY MOUTH ONCE A DAY 90 tablet 0   solifenacin (VESICARE) 10 MG tablet TAKE 1 TABLET BY MOUTH ONCE A DAY 90 tablet 0   zolpidem (AMBIEN CR) 12.5 MG CR tablet TAKE 1 TABLET BY MOUTH ONCE DAILY AT BEDTIME AS NEEDED FOR SLEEP 90 tablet 0   No current facility-administered medications on file prior to visit.    Review of Systems  Constitutional:  Positive for fatigue. Negative for activity change,  appetite change, fever  and unexpected weight change.  HENT:  Negative for congestion, ear pain, rhinorrhea, sinus pressure and sore throat.   Eyes:  Negative for pain, redness and visual disturbance.  Respiratory:  Negative for cough, shortness of breath and wheezing.   Cardiovascular:  Negative for chest pain, palpitations and leg swelling.  Gastrointestinal:  Negative for abdominal pain, blood in stool, constipation and diarrhea.  Endocrine: Negative for polydipsia and polyuria.  Genitourinary:  Negative for dysuria, frequency and urgency.  Musculoskeletal:  Negative for arthralgias, back pain and myalgias.  Skin:  Negative for pallor and rash.  Allergic/Immunologic: Negative for environmental allergies.  Neurological:  Negative for dizziness, syncope and headaches.  Hematological:  Negative for adenopathy. Does not bruise/bleed easily.  Psychiatric/Behavioral:  Positive for dysphoric mood. Negative for decreased concentration, self-injury and suicidal ideas. The patient is nervous/anxious.        Objective:   Physical Exam Constitutional:      General: She is not in acute distress.    Appearance: Normal appearance. She is well-developed. She is obese. She is not ill-appearing or diaphoretic.  HENT:     Head: Normocephalic and atraumatic.     Mouth/Throat:     Mouth: Mucous membranes are moist.  Eyes:     General: No scleral icterus.    Conjunctiva/sclera: Conjunctivae normal.     Pupils: Pupils are equal, round, and reactive to light.  Neck:     Thyroid: No thyromegaly.     Vascular: No carotid bruit or JVD.  Cardiovascular:     Rate and Rhythm: Tachycardia present. Rhythm irregular.     Heart sounds: Normal heart sounds.     No gallop.  Pulmonary:     Effort: Pulmonary effort is normal. No respiratory distress.     Breath sounds: Normal breath sounds. No wheezing or rales.  Abdominal:     General: There is no distension or abdominal bruit.     Palpations: Abdomen is  soft.  Musculoskeletal:     Cervical back: Normal range of motion and neck supple.     Right lower leg: No edema.     Left lower leg: No edema.  Lymphadenopathy:     Cervical: No cervical adenopathy.  Skin:    General: Skin is warm and dry.     Coloration: Skin is not pale.     Findings: No rash.  Neurological:     Mental Status: She is alert.     Coordination: Coordination normal.     Deep Tendon Reflexes: Reflexes are normal and symmetric. Reflexes normal.  Psychiatric:        Attention and Perception: Attention normal.        Mood and Affect: Mood is anxious and depressed.        Speech: Speech normal.        Cognition and Memory: Cognition and memory normal.     Comments: Moderately anxious Seems less depressed  Candidly discusses symptoms and stressors             Assessment & Plan:   Problem List Items Addressed This Visit       Cardiovascular and Mediastinum   HYPERTENSION, BENIGN ESSENTIAL    bp in fair control at this time  BP Readings from Last 1 Encounters:  10/26/21 110/76  No changes needed Most recent labs reviewed  Disc lifstyle change with low sodium diet and exercise  Continues metoprolol xl 50 mg daily  Some fatigue  Pulse up from anxiety /also a fib-continues  card care         Other   Depression with anxiety - Primary    Did worse with cymbalta  Now back on paxil 60 and buspar and better than she was Less tearful but still quite anxious  Reviewed stressors/ coping techniques/symptoms/ support sources/ tx options and side effects in detail today Plans to call [facility] to get est with psychology and psychiatry  Encouraged good self care in the setting of stressors and health problems

## 2021-10-26 NOTE — Assessment & Plan Note (Signed)
bp in fair control at this time  BP Readings from Last 1 Encounters:  10/26/21 110/76   No changes needed Most recent labs reviewed  Disc lifstyle change with low sodium diet and exercise  Continues metoprolol xl 50 mg daily  Some fatigue  Pulse up from anxiety /also a fib-continues card care

## 2021-10-26 NOTE — Patient Instructions (Addendum)
Please call dr Cindra Eves office to see if he wants to do a thyroid biopsy  If you are going to see a different doctor let us know    Please call the [facility] to get set up with a psychiatrist and a counselor   [facility] [address], [place] [address]  [phone]  Let me know how it goes   When you are anxious your stomach makes more acid  Take pepcid 20 mg at bedtime This should cut morning acid

## 2021-10-26 NOTE — Assessment & Plan Note (Addendum)
Did worse with cymbalta  Now back on paxil 60 and buspar and better than she was Less tearful but still quite anxious  Reviewed stressors/ coping techniques/symptoms/ support sources/ tx options and side effects in detail today Plans to call [facility] to get est with psychology and psychiatry  Encouraged good self care in the setting of stressors and health problems

## 2021-11-01 ENCOUNTER — Telehealth: Payer: Self-pay | Admitting: Family Medicine

## 2021-11-01 DIAGNOSIS — F418 Other specified anxiety disorders: Secondary | ICD-10-CM

## 2021-11-01 NOTE — Telephone Encounter (Signed)
Pt called in would like a call back stated she need a referral to see a psychiatrist  . Please Advise [phone]

## 2021-11-01 NOTE — Telephone Encounter (Signed)
Called and spoke with patient , she said that she called the number that she was given by you, however they take Medicaid patient only.  The other place is [place]   the would need a referral. Pt is requesting a referral for this  or if you knew of any place in [place].

## 2021-11-02 NOTE — Telephone Encounter (Signed)
Any ideas for this? - I sent you a teams, thanks

## 2021-11-04 ENCOUNTER — Other Ambulatory Visit: Payer: Self-pay | Admitting: Cardiovascular Disease

## 2021-11-04 ENCOUNTER — Other Ambulatory Visit: Payer: Self-pay | Admitting: Family Medicine

## 2021-11-04 NOTE — Telephone Encounter (Signed)
Last filled 11/04/21 Last ov 10/26/21

## 2021-11-16 ENCOUNTER — Other Ambulatory Visit: Payer: Self-pay | Admitting: Pulmonary Disease

## 2021-11-21 DIAGNOSIS — F411 Generalized anxiety disorder: Secondary | ICD-10-CM | POA: Diagnosis not present

## 2021-11-21 DIAGNOSIS — F332 Major depressive disorder, recurrent severe without psychotic features: Secondary | ICD-10-CM | POA: Diagnosis not present

## 2021-11-21 DIAGNOSIS — F5101 Primary insomnia: Secondary | ICD-10-CM | POA: Diagnosis not present

## 2021-11-24 NOTE — Telephone Encounter (Signed)
Its my understanding that the [facility] location takes all insurances.  There are several locations in [place] we can try  [facility] - External Address: [address] [place], [place] [address] Phone: [phone]    [facility] - Internal Address: [address], [place], [place] [address] Phone: [phone]   [place], [place], [facility], [facility] (psych evals) - External Address: [address] [place],  [address] Phone: [phone]    [facility]) - External Address: [address] [place], [place],  [address] Phone: [phone]

## 2021-11-24 NOTE — Telephone Encounter (Signed)
Would you please send this along to pt ?  Thanks

## 2021-11-25 NOTE — Telephone Encounter (Signed)
Sent mychart letting pt know  ?

## 2021-11-29 ENCOUNTER — Ambulatory Visit (INDEPENDENT_AMBULATORY_CARE_PROVIDER_SITE_OTHER): Payer: Medicare Other | Admitting: Pulmonary Disease

## 2021-11-29 ENCOUNTER — Ambulatory Visit
Admission: RE | Admit: 2021-11-29 | Discharge: 2021-11-29 | Disposition: A | Discharge status: Home / Self Care | Payer: Medicare Other | Source: Ambulatory Visit | Attending: Family Medicine | Admitting: Family Medicine

## 2021-11-29 ENCOUNTER — Encounter: Payer: Self-pay | Admitting: Pulmonary Disease

## 2021-11-29 VITALS — BP 142/90 | HR 95 | Temp 97.4°F | Ht 63.0 in | Wt 204.0 lb

## 2021-11-29 DIAGNOSIS — E2839 Other primary ovarian failure: Secondary | ICD-10-CM

## 2021-11-29 DIAGNOSIS — Z720 Tobacco use: Secondary | ICD-10-CM

## 2021-11-29 DIAGNOSIS — I4819 Other persistent atrial fibrillation: Secondary | ICD-10-CM | POA: Diagnosis not present

## 2021-11-29 DIAGNOSIS — E669 Obesity, unspecified: Secondary | ICD-10-CM | POA: Diagnosis not present

## 2021-11-29 DIAGNOSIS — Z23 Encounter for immunization: Secondary | ICD-10-CM

## 2021-11-29 DIAGNOSIS — M85852 Other specified disorders of bone density and structure, left thigh: Secondary | ICD-10-CM | POA: Diagnosis not present

## 2021-11-29 DIAGNOSIS — R0602 Shortness of breath: Secondary | ICD-10-CM | POA: Diagnosis not present

## 2021-11-29 DIAGNOSIS — Z780 Asymptomatic menopausal state: Secondary | ICD-10-CM | POA: Diagnosis not present

## 2021-11-29 MED ORDER — ALBUTEROL SULFATE HFA 108 (90 BASE) MCG/ACT IN AERS: 2.0000 | INHALATION_SPRAY | Freq: Four times a day (QID) | RESPIRATORY_TRACT | 3 refills | Status: DC | PRN

## 2021-11-29 NOTE — Patient Instructions (Signed)
We sent a prescription for an as needed inhaler to your pharmacy.  We have ordered breathing tests to check on your breathing.  We will see him in follow-up in 3 months time call sooner should any new problems arise.

## 2021-11-30 ENCOUNTER — Ambulatory Visit (INDEPENDENT_AMBULATORY_CARE_PROVIDER_SITE_OTHER): Payer: Medicare Other | Admitting: Family Medicine

## 2021-11-30 VITALS — BP 120/82 | HR 89 | Temp 97.9°F | Ht 63.5 in | Wt 204.1 lb

## 2021-11-30 DIAGNOSIS — M5137 Other intervertebral disc degeneration, lumbosacral region: Secondary | ICD-10-CM | POA: Diagnosis not present

## 2021-11-30 DIAGNOSIS — G8929 Other chronic pain: Secondary | ICD-10-CM | POA: Diagnosis not present

## 2021-11-30 DIAGNOSIS — M51379 Other intervertebral disc degeneration, lumbosacral region without mention of lumbar back pain or lower extremity pain: Secondary | ICD-10-CM

## 2021-11-30 DIAGNOSIS — M549 Dorsalgia, unspecified: Secondary | ICD-10-CM

## 2021-11-30 MED ORDER — TRIAMCINOLONE ACETONIDE 40 MG/ML IJ SUSP
40.0000 mg | Freq: Once | INTRAMUSCULAR | Status: AC
  Administered 2021-11-30: 40 mg via INTRAMUSCULAR

## 2021-11-30 MED ORDER — TRAMADOL HCL 50 MG PO TABS: 50.0000 mg | ORAL_TABLET | Freq: Three times a day (TID) | ORAL | 0 refills | Status: DC | PRN

## 2021-11-30 MED ORDER — CYCLOBENZAPRINE HCL 10 MG PO TABS: 5.0000 mg | ORAL_TABLET | Freq: Every evening | ORAL | 3 refills | Status: DC | PRN

## 2021-11-30 MED ORDER — PREDNISONE 20 MG PO TABS: ORAL_TABLET | ORAL | 0 refills | Status: DC

## 2021-11-30 NOTE — Progress Notes (Unsigned)
    Lazlo Tunney T. Demetric Dunnaway, MD, [facility] at [facility] [place], [address]  Phone: [phone]  FAX: [phone]  Anita Harrell - 71 y.o. female  MRN [id number]  Date of Birth: [birth year]  Date: 11/30/2021  PCP: Abner Greenspan, MD  Referral: Abner Greenspan, MD  Chief Complaint  Patient presents with   Hip Pain    Right   Subjective:   Anita Harrell is a 71 y.o. very pleasant female patient with Body mass index is 35.59 kg/m. who presents with the following:  Now in AF and on Eliquis - trouble with troch bursitis in the past.  She presents today and asked if she can have a repeat hip injection, but on further discussion with the patient she is having pain in the low back as well as pain in the upper buttocks region, more on the right.  She does not have lateral hip pain and she denies groin pain.  She does have pain that localizes to the back in the buttocks, she does not have radicular pain down her legs.  She is not having any numbness, tingling, or focal weakness in the lower extremities.  She has tried some Flexeril with some relief of symptoms, but she is currently out.  No prior operative intervention or major back trauma.  She has been having problems with this area in her back for greater than 1 year.  Ongoing for a couple of months, the least little thing will hurt. Pain in the upper buttocks and low back   Chronic low back pain  Review of Systems is noted in the HPI, as appropriate  Objective:   BP 120/82   Pulse 89   Temp 97.9 F (36.6 C) (Oral)   Ht 5' 3.5" (1.613 m)   Wt 204 lb 2 oz (92.6 kg)   SpO2 96%   BMI 35.59 kg/m   GEN: No acute distress; alert,appropriate. PULM: Breathing comfortably in no respiratory distress PSYCH: Normally interactive.   Laboratory and Imaging Data:  Assessment and Plan:   ***

## 2021-12-01 ENCOUNTER — Encounter: Payer: Self-pay | Admitting: Family Medicine

## 2021-12-06 DIAGNOSIS — F332 Major depressive disorder, recurrent severe without psychotic features: Secondary | ICD-10-CM | POA: Diagnosis not present

## 2021-12-06 DIAGNOSIS — F411 Generalized anxiety disorder: Secondary | ICD-10-CM | POA: Diagnosis not present

## 2021-12-08 ENCOUNTER — Other Ambulatory Visit: Payer: Self-pay | Admitting: Family Medicine

## 2021-12-10 NOTE — Progress Notes (Unsigned)
Cardiology Office Note  Date:  12/10/2021   ID:  Grier, Czerwinski [birth year], MRN [id number]  PCP:  Abner Greenspan, MD   No chief complaint on file.   HPI:  [name] is a 71 year old woman with past medical history of Hypertension Depression/anxiety Vapor cigarettes/former smoker 20 years Chronic shortness of breath/smoker's cough Who presents for follow-up of her atrial fibrillation  Last seen by myself in clinic May 2023 Seen by one of our providers August 31, 2021  Echocardiogram August 30, 2021 . Left ventricular ejection fraction, by estimation, is 55 to 60%. The  left ventricle has normal function. The left ventricle has no regional  wall motion abnormalities. Left ventricular diastolic parameters are  indeterminate.   2. Right ventricular systolic function is normal. The right ventricular  size is normal. Tricuspid regurgitation signal is inadequate for assessing  PA pressure.   3. Left atrial size was moderately dilated.   4. The mitral valve is normal in structure. Mild mitral valve  regurgitation. No evidence of mitral stenosis.   5. The aortic valve is normal in structure. Aortic valve regurgitation is  not visualized. No aortic stenosis is present.   6. The inferior vena cava is normal in size with greater than 50%  respiratory variability, suggesting right atrial pressure of 3 mmHg.     Was seen in the pulmonary clinic yesterday noted to have new onset atrial fibrillation, rate elevated  we had talked with Dr. Patsey Berthold and recommended she follow-up in cardiology clinic and start Eliquis In pulmonary, was started on Eliquis No prior cardiac work-up available for review  CT scan chest with aortic and coronary calcification Emphysema  EKG personally reviewed by myself on todays visit Atrial fibrillation rate 114 bpm pvc Relatively unchanged from EKG yesterday showing atrial fibrillation Old EKG 2014 showing normal sinus rhythm  Unclear when she  converted to atrial fibrillation, has been having some shortness of breath with stairs for quite some time Deconditioned at baseline  Reports having issues with anxiety  PMH:   has a past medical history of Allergy, Anxiety, Back pain, Chest pain, Depression, Epigastric pain, Goiter, History of tobacco abuse, Hyperglycemia, Hyperlipidemia, Hypertension, Insomnia, Labile blood pressure, Panic disorder, Personal history of colonic polyps (06/09/2004), and SVD (spontaneous vaginal delivery).  PSH:    Past Surgical History:  Procedure Laterality Date   ABDOMINAL HYSTERECTOMY N/A 09/24/2012   Procedure: HYSTERECTOMY ABDOMINAL;  Surgeon: Gus Height, MD;  Location: [facility];  Service: Gynecology;  Laterality: N/A;   BREAST BIOPSY     COLONOSCOPY     COLONOSCOPY  2017   ELBOW SURGERY  2004   EYE SURGERY     bilateral lasik   SALPINGOOPHORECTOMY Bilateral 09/24/2012   Procedure: SALPINGO OOPHORECTOMY;  Surgeon: Gus Height, MD;  Location: [facility];  Service: Gynecology;  Laterality: Bilateral;   THYROID SURGERY     B9 massess   WART FULGURATION N/A 09/24/2012   Procedure: FULGURATION VAGINAL WART;  Surgeon: Gus Height, MD;  Location: [facility];  Service: Gynecology;  Laterality: N/A;   WISDOM TOOTH EXTRACTION      Current Outpatient Medications  Medication Sig Dispense Refill   solifenacin (VESICARE) 10 MG tablet TAKE 1 TABLET BY MOUTH ONCE A DAY 90 tablet 1   albuterol (VENTOLIN HFA) 108 (90 Base) MCG/ACT inhaler Inhale 2 puffs into the lungs every 6 (six) hours as needed for wheezing or shortness of breath. 18 g 3   ARIPiprazole (ABILIFY) 2 MG tablet Take  2 mg by mouth daily.     busPIRone (BUSPAR) 30 MG tablet TAKE ONE HALF TO 1 TABLET BY MOUTH 3 TIMES DAILY AS DIRECTED 270 tablet 1   cyclobenzaprine (FLEXERIL) 10 MG tablet Take 0.5-1 tablets (5-10 mg total) by mouth at bedtime as needed for muscle spasms. 30 tablet 3   ELIQUIS 5 MG TABS tablet TAKE 1 TABLET BY MOUTH TWICE A DAY 60 tablet 1    famotidine (PEPCID) 20 MG tablet Take 1 tablet (20 mg total) by mouth at bedtime. 90 tablet 3   metoprolol succinate (TOPROL-XL) 50 MG 24 hr tablet TAKE 1 TABLET BY MOUTH TWICE A DAY WITH MEALS 180 tablet 1   PARoxetine (PAXIL) 40 MG tablet Take 1.5 tablets (60 mg total) by mouth every morning. 135 tablet 3   predniSONE (DELTASONE) 20 MG tablet 2 tabs po daily for 5 days, then 1 tab po daily for 5 days 15 tablet 0   rosuvastatin (CRESTOR) 10 MG tablet TAKE 1 TABLET BY MOUTH ONCE A DAY 90 tablet 0   traMADol (ULTRAM) 50 MG tablet Take 1 tablet (50 mg total) by mouth every 8 (eight) hours as needed for moderate pain. 20 tablet 0   zolpidem (AMBIEN CR) 12.5 MG CR tablet TAKE 1 TABLET BY MOUTH ONCE DAILY AT BEDTIME AS NEEDED FOR SLEEP 90 tablet 0   No current facility-administered medications for this visit.     Allergies:   Codeine, Cymbalta [duloxetine hcl], Lipitor [atorvastatin], and Wellbutrin [bupropion]   Social History:  The patient  reports that she quit smoking about 13 years ago. Her smoking use included cigarettes and e-cigarettes. She has a 20.00 pack-year smoking history. She has never used smokeless tobacco. She reports that she does not drink alcohol and does not use drugs.   Family History:   family history includes Alcohol abuse in her father; Breast cancer in her sister; Cancer in her father and sister; Heart disease in her father and sister; Hypertension in her mother; Stroke in her mother.    Review of Systems: Review of Systems  Constitutional: Negative.   HENT: Negative.    Respiratory:  Positive for shortness of breath.   Cardiovascular: Negative.   Gastrointestinal: Negative.   Musculoskeletal: Negative.   Neurological: Negative.   Psychiatric/Behavioral: Negative.    All other systems reviewed and are negative.    PHYSICAL EXAM: VS:  There were no vitals taken for this visit. , BMI There is no height or weight on file to calculate BMI. GEN: Well nourished,  well developed, in no acute distress HEENT: normal Neck: no JVD, carotid bruits, or masses Cardiac: Irregularly irregular no murmurs, rubs, or gallops,no edema  Respiratory:  clear to auscultation bilaterally, normal work of breathing GI: soft, nontender, nondistended, + BS MS: no deformity or atrophy Skin: warm and dry, no rash Neuro:  Strength and sensation are intact Psych: euthymic mood, full affect   Recent Labs: 06/07/2021: ALT 17; TSH 2.14 08/31/2021: BUN 11; Creatinine, Ser 0.57; Hemoglobin 13.5; Platelets 120; Potassium 3.9; Sodium 138    Lipid Panel Lab Results  Component Value Date   CHOL 118 06/07/2021   HDL 41.30 06/07/2021   LDLCALC 37 06/07/2021   TRIG 197.0 (H) 06/07/2021      Wt Readings from Last 3 Encounters:  11/30/21 204 lb 2 oz (92.6 kg)  11/29/21 204 lb (92.5 kg)  10/26/21 203 lb 3.2 oz (92.2 kg)       ASSESSMENT AND PLAN:  Problem List Items  Addressed This Visit   None Persistent atrial fibrillation with RVR Long discussion concerning atrial fibrillation, mechanism, treatment Recommend she double her metoprolol succinate up to 50 twice daily for better rate control Recommend she hold the lisinopril HCTZ to avoid hypotension Continue Eliquis 5 twice daily Echocardiogram ordered Does not appear to have volume overload on today's visit Recommend possible cardioversion can be arranged in follow-up We will hold off on antiarrhythmic medications at this time  Shortness of breath Possibly secondary to atrial fibrillation though unable to exclude General deconditioning  Anxiety Reassurance provided   Total encounter time more than 60 minutes  Greater than 50% was spent in counseling and coordination of care with the patient    Signed, Esmond Plants, M.D., Ph.D. [place], [place]

## 2021-12-12 ENCOUNTER — Ambulatory Visit (INDEPENDENT_AMBULATORY_CARE_PROVIDER_SITE_OTHER): Payer: Medicare Other

## 2021-12-12 ENCOUNTER — Encounter: Payer: Self-pay | Admitting: Cardiovascular Disease

## 2021-12-12 ENCOUNTER — Ambulatory Visit: Payer: Medicare Other | Attending: Cardiovascular Disease | Admitting: Cardiovascular Disease

## 2021-12-12 VITALS — BP 130/80 | HR 91 | Ht 62.0 in | Wt 200.5 lb

## 2021-12-12 DIAGNOSIS — E7800 Pure hypercholesterolemia, unspecified: Secondary | ICD-10-CM | POA: Insufficient documentation

## 2021-12-12 DIAGNOSIS — Z79899 Other long term (current) drug therapy: Secondary | ICD-10-CM | POA: Diagnosis not present

## 2021-12-12 DIAGNOSIS — I10 Essential (primary) hypertension: Secondary | ICD-10-CM | POA: Diagnosis not present

## 2021-12-12 DIAGNOSIS — G4730 Sleep apnea, unspecified: Secondary | ICD-10-CM | POA: Diagnosis not present

## 2021-12-12 DIAGNOSIS — F419 Anxiety disorder, unspecified: Secondary | ICD-10-CM | POA: Diagnosis not present

## 2021-12-12 DIAGNOSIS — R0602 Shortness of breath: Secondary | ICD-10-CM | POA: Insufficient documentation

## 2021-12-12 DIAGNOSIS — Z5181 Encounter for therapeutic drug level monitoring: Secondary | ICD-10-CM | POA: Diagnosis not present

## 2021-12-12 DIAGNOSIS — I4819 Other persistent atrial fibrillation: Secondary | ICD-10-CM | POA: Insufficient documentation

## 2021-12-12 MED ORDER — FLECAINIDE ACETATE 100 MG PO TABS: 100.0000 mg | ORAL_TABLET | Freq: Two times a day (BID) | ORAL | 3 refills | Status: DC

## 2021-12-12 NOTE — Patient Instructions (Addendum)
Medication Instructions:  Please start flecainide 100 mg twice as day Flecainide Treadmill in 2 weeks  If you need a refill on your cardiac medications before your next appointment, please call your pharmacy.   Lab work: No new labs needed  Testing/Procedures:  Zio monitor for 2 weeks, paroxysmal atrial fibrillation THIS WILL BE MAILED TO YOU   Your physician has requested that you have an exercise tolerance test. For further information please visit [email or web address]. Please also follow instruction sheet, as given. DO NOT TAKE YOUR METOPROLOL NIGHT BEFORE AND MORNING OF YOUR TREADMILL   Follow-Up: At [facility], you and your health needs are our priority.  As part of our continuing mission to provide you with exceptional heart care, we have created designated Provider Care Teams.  These Care Teams include your primary Cardiologist (physician) and Advanced Practice Providers (APPs -  Physician Assistants and Nurse Practitioners) who all work together to provide you with the care you need, when you need it.  You will need a follow up appointment in 5 weeks  Providers on your designated Care Team:   Murray Hodgkins, NP Christell Faith, PA-C Cadence Kathlen Mody, [place]  COVID-19 Vaccine Information can be found at: [email or web address].uk For questions related to vaccine distribution or appointments, please email [email or web address] or call [phone].   ZIO XT- Long Term Monitor Instructions  Your physician has requested you wear a ZIO patch monitor for 14 days.  This is a single patch monitor. [employer] supplies one patch monitor per enrollment. Additional stickers are not available. Please do not apply patch if you will be having a Nuclear Stress Test,  Echocardiogram, Cardiac CT, MRI, or Chest Xray during the period you would be wearing the  monitor. The patch cannot be worn during these tests. You cannot remove and re-apply the   ZIO XT patch monitor.  Your ZIO patch monitor will be mailed 3 day USPS to your address on file. It may take 3-5 days  to receive your monitor after you have been enrolled.  Once you have received your monitor, please review the enclosed instructions. Your monitor  has already been registered assigning a specific monitor serial # to you.  Billing and Patient Assistance Program Information  We have supplied [employer] with any of your insurance information on file for billing purposes. [employer] offers a sliding scale Patient Assistance Program for patients that do not have  insurance, or whose insurance does not completely cover the cost of the ZIO monitor.  You must apply for the Patient Assistance Program to qualify for this discounted rate.  To apply, please call [employer] at [phone], select option 4, select option 2, ask to apply for  Patient Assistance Program. Theodore Demark will ask your household income, and how many people  are in your household. They will quote your out-of-pocket cost based on that information.  [employer] will also be able to set up a 34-month interest-free payment plan if needed.  Applying the monitor   Shave hair from upper left chest.  Hold abrader disc by orange tab. Rub abrader in 40 strokes over the upper left chest as  indicated in your monitor instructions.  Clean area with 4 enclosed alcohol pads. Let dry.  Apply patch as indicated in monitor instructions. Patch will be placed under collarbone on left  side of chest with arrow pointing upward.  Rub patch adhesive wings for 2 minutes. Remove white label marked "1". Remove the white  label marked "2". Rub patch adhesive wings for 2 additional minutes.  While looking in a mirror, press and release button in center of patch. A small green light will  flash 3-4 times. This will be your only indicator that the monitor has been turned on.  Do not shower for the first 24 hours. You may shower after the first 24 hours.   Press the button if you feel a symptom. You will hear a small click. Record Date, Time and  Symptom in the Patient Logbook.  When you are ready to remove the patch, follow instructions on the last 2 pages of Patient  Logbook. Stick patch monitor onto the last page of Patient Logbook.  Place Patient Logbook in the blue and white box. Use locking tab on box and tape box closed  securely. The blue and white box has prepaid postage on it. Please place it in the mailbox as  soon as possible. Your physician should have your test results approximately 7 days after the  monitor has been mailed back to [facility].  Call [facility] at [phone] if you have questions regarding  your ZIO XT patch monitor. Call them immediately if you see an orange light blinking on your  monitor.  If your monitor falls off in less than 4 days, contact our Monitor department at [phone].  If your monitor becomes loose or falls off after 4 days call [employer] at [phone] for  suggestions on securing your monitor

## 2021-12-14 ENCOUNTER — Other Ambulatory Visit: Payer: Self-pay | Admitting: Family Medicine

## 2021-12-15 NOTE — Telephone Encounter (Signed)
Please schedule an in office appointment. I will need the name of her psychiatrist.  Let her know there is always some trial and error with mental health care so if she ever has side effects to a drug she needs to let the prescribing provider know.   Do NOT leave the psychiatrist until after we talk to help make that choice.   I can only determine if I can help after we talk.   Thanks

## 2021-12-15 NOTE — Telephone Encounter (Signed)
Called pt because last filled on 09/21/21 #90 tabs so it's over a week early.  Pt said she couldn't find the bottle and she's not sure if she requested this or how much med she has. Pt said that she has been seeing a psychiatrist and they have "been playing with her medicine" and she is not sure what's going on with meds.  Pt said she isn't happy with psychiatrist the last med they gave her made her vision so blurry that she couldn't see for 4 days so she went off of med herself and didn't tell them. Pt doesn't want to go back to psychiatrist and asked if PCP could just keep refilling her meds like she use to do. Pt said she saw a dentist and showed them the med and they were not sure why she was prescribed it since she isn't bipolar. Pt said she doesn't trust her anymore. Pt said the only reason she even went to psychiatrist is because friend told her to but before that PCP was refilling meds. Pt couldn't tell me the name of the med she was talking about and she couldn't tell me the name of the psychiatrist either.  Pt wants PCP to take over meds again pt will also look around home to see if she can find  bottle of Ambien to see how many pills she has left

## 2021-12-16 NOTE — Telephone Encounter (Signed)
Left VM requesting pt to call the office back 

## 2021-12-19 NOTE — Telephone Encounter (Signed)
Pt notified of Dr. Marliss Coots comments f/u appt to discuss this scheduled on 12/26/21  **pt does need Ambien refilled now, she took her last pill last night. Last filled on 09/21/21 #90/ 0 refills

## 2021-12-20 DIAGNOSIS — F332 Major depressive disorder, recurrent severe without psychotic features: Secondary | ICD-10-CM | POA: Diagnosis not present

## 2021-12-20 DIAGNOSIS — F5101 Primary insomnia: Secondary | ICD-10-CM | POA: Diagnosis not present

## 2021-12-20 DIAGNOSIS — F411 Generalized anxiety disorder: Secondary | ICD-10-CM | POA: Diagnosis not present

## 2021-12-21 ENCOUNTER — Ambulatory Visit: Payer: Medicare Other | Attending: Cardiovascular Disease

## 2021-12-21 DIAGNOSIS — Z5181 Encounter for therapeutic drug level monitoring: Secondary | ICD-10-CM

## 2021-12-21 DIAGNOSIS — Z79899 Other long term (current) drug therapy: Secondary | ICD-10-CM | POA: Diagnosis not present

## 2021-12-21 DIAGNOSIS — R0602 Shortness of breath: Secondary | ICD-10-CM

## 2021-12-21 LAB — EXERCISE TOLERANCE TEST
Estimated workload: 3.8
Exercise duration (min): 1 min
Exercise duration (sec): 34 s
MPHR: 148 {beats}/min
Peak HR: 142 {beats}/min
Percent HR: 95 %
Rest HR: 108 {beats}/min
ST Depression (mm): 0 mm

## 2021-12-23 DIAGNOSIS — I4819 Other persistent atrial fibrillation: Secondary | ICD-10-CM

## 2021-12-23 DIAGNOSIS — R0602 Shortness of breath: Secondary | ICD-10-CM | POA: Diagnosis not present

## 2021-12-26 ENCOUNTER — Ambulatory Visit (INDEPENDENT_AMBULATORY_CARE_PROVIDER_SITE_OTHER): Payer: Medicare Other | Admitting: Family Medicine

## 2021-12-26 ENCOUNTER — Encounter: Payer: Self-pay | Admitting: Family Medicine

## 2021-12-26 VITALS — BP 124/78 | HR 114 | Temp 97.6°F | Ht 62.0 in | Wt 200.4 lb

## 2021-12-26 DIAGNOSIS — F418 Other specified anxiety disorders: Secondary | ICD-10-CM | POA: Diagnosis not present

## 2021-12-26 DIAGNOSIS — M5136 Other intervertebral disc degeneration, lumbar region: Secondary | ICD-10-CM

## 2021-12-26 NOTE — Assessment & Plan Note (Signed)
Saw Dr Lorelei Pont and improved with prednisone Plans to start PT in Dec May need handicapped parking renewed in [place]

## 2021-12-26 NOTE — Progress Notes (Signed)
Subjective:    Patient ID: Anita Harrell, female    DOB: 1950/11/02, 71 y.o.   MRN: [id number]  HPI Pt presents for f/u of depression with anxiety   Wt Readings from Last 3 Encounters:  12/26/21 200 lb 6 oz (90.9 kg)  12/12/21 200 lb 8 oz (90.9 kg)  11/30/21 204 lb 2 oz (92.6 kg)   36.65 kg/m  Went to psychiatry (not sure what her psychiatrist's name)  Disliked what she tried - was on abilify  It made her vision worse , right eye , her eye doctor thought it was a side effect She was a little better but she just was not comfortable with it  The psychiatrist was going to try something different (unsure what)   Was too anxious to go to the psychiatrist   Less tearful than she was    Does not want to see a counselor    Currently  Buspar 15-30 mg tid  Paxil 60 mg daily  Ambien cr 12.5 mg   A lot going on physically  Heart and lung  Wearing a monitor   Saw Dr Lorelei Pont for back/sciatic problem Prednisone helped Starts dec 6th    Not doing a lot for herself  Spends time with her family    Patient Active Problem List   Diagnosis Date Noted   DDD (degenerative disc disease), lumbar 12/26/2021   Bilateral hip bursitis 10/05/2021   Atrial fibrillation, persistent ([place]) 10/05/2021   Sleep walking 10/05/2021   Cerumen impaction 06/21/2021   Estrogen deficiency 06/07/2021   Fatigue 06/21/2020   Overactive bladder 10/17/2019   Medicare annual wellness visit, subsequent 01/16/2019   Thyroid nodule 05/01/2018   Electronic cigarette use 04/03/2018   Obesity (BMI 30-39.9) 04/03/2018   Lung mass 02/01/2018   Abnormal chest x-ray 01/29/2018   Smokers' cough ([place]) 01/01/2018   Tachycardia 11/01/2015   Microscopic hematuria 08/13/2012   Routine general medical examination at a health care facility 07/22/2012   Leukocytosis 03/30/2010   Prediabetes 02/21/2008   HYPERTENSION, BENIGN ESSENTIAL 01/22/2008   ALLERGIC RHINITIS 07/25/2007   BACK PAIN 06/26/2007    Personal history of goiter 07/27/2006   Hyperlipidemia 07/27/2006   PANIC DISORDER 07/27/2006   Former smoker 07/27/2006   Depression with anxiety 07/27/2006   Insomnia 07/27/2006   Personal history of colonic polyps 06/09/2004   Past Medical History:  Diagnosis Date   Allergy    allergic rhinitis   Anxiety    Back pain    Chest pain    Depression    Epigastric pain    Goiter    History of tobacco abuse    Hyperglycemia    mild   Hyperlipidemia    Hypertension    Insomnia    Hx of   Labile blood pressure    Panic disorder    History of   Personal history of colonic polyps 06/09/2004   SVD (spontaneous vaginal delivery)    x 2   Past Surgical History:  Procedure Laterality Date   ABDOMINAL HYSTERECTOMY N/A 09/24/2012   Procedure: HYSTERECTOMY ABDOMINAL;  Surgeon: Gus Height, MD;  Location: [facility];  Service: Gynecology;  Laterality: N/A;   BREAST BIOPSY     COLONOSCOPY     COLONOSCOPY  2017   ELBOW SURGERY  2004   EYE SURGERY     bilateral lasik   SALPINGOOPHORECTOMY Bilateral 09/24/2012   Procedure: SALPINGO OOPHORECTOMY;  Surgeon: Gus Height, MD;  Location: [facility];  Service: Gynecology;  Laterality: Bilateral;   THYROID SURGERY     B9 massess   WART FULGURATION N/A 09/24/2012   Procedure: FULGURATION VAGINAL WART;  Surgeon: Gus Height, MD;  Location: [facility];  Service: Gynecology;  Laterality: N/A;   WISDOM TOOTH EXTRACTION     Social History   Tobacco Use   Smoking status: Former    Packs/day: 1.00    Years: 20.00    Total pack years: 20.00    Types: Cigarettes, E-cigarettes    Quit date: 12/11/2008    Years since quitting: 13.0   Smokeless tobacco: Never   Tobacco comments:    She is vaping   Vaping Use   Vaping Use: Every day  Substance Use Topics   Alcohol use: No    Alcohol/week: 0.0 standard drinks of alcohol   Drug use: No   Family History  Problem Relation Age of Onset   Hypertension Mother    Stroke Mother    Alcohol abuse Father    Cancer  Father        lung CA   Heart disease Father        CHF   Cancer Sister        breast CA   Heart disease Sister        CHF from chemotx also has defib   Breast cancer Sister    Colon cancer Neg Hx    Esophageal cancer Neg Hx    Rectal cancer Neg Hx    Stomach cancer Neg Hx    Allergies  Allergen Reactions   Abilify [Aripiprazole]     Vision problem   Codeine Nausea And Vomiting    REACTION: Nausea and vomiting Pt has tolerated vicodin & percocet in the past   Cymbalta [Duloxetine Hcl]     More depressed/tearful    Lipitor [Atorvastatin]     Muscle pain    Wellbutrin [Bupropion]    Current Outpatient Medications on File Prior to Visit  Medication Sig Dispense Refill   albuterol (VENTOLIN HFA) 108 (90 Base) MCG/ACT inhaler Inhale 2 puffs into the lungs every 6 (six) hours as needed for wheezing or shortness of breath. 18 g 3   busPIRone (BUSPAR) 30 MG tablet TAKE ONE HALF TO 1 TABLET BY MOUTH 3 TIMES DAILY AS DIRECTED 270 tablet 1   cyclobenzaprine (FLEXERIL) 10 MG tablet Take 0.5-1 tablets (5-10 mg total) by mouth at bedtime as needed for muscle spasms. 30 tablet 3   ELIQUIS 5 MG TABS tablet TAKE 1 TABLET BY MOUTH TWICE A DAY 60 tablet 1   famotidine (PEPCID) 20 MG tablet Take 1 tablet (20 mg total) by mouth at bedtime. 90 tablet 3   flecainide (TAMBOCOR) 100 MG tablet Take 1 tablet (100 mg total) by mouth 2 (two) times daily. 180 tablet 3   metoprolol succinate (TOPROL-XL) 50 MG 24 hr tablet TAKE 1 TABLET BY MOUTH TWICE A DAY WITH MEALS 180 tablet 1   PARoxetine (PAXIL) 40 MG tablet Take 1.5 tablets (60 mg total) by mouth every morning. 135 tablet 3   rosuvastatin (CRESTOR) 10 MG tablet TAKE 1 TABLET BY MOUTH ONCE A DAY 90 tablet 0   solifenacin (VESICARE) 10 MG tablet TAKE 1 TABLET BY MOUTH ONCE A DAY 90 tablet 1   traMADol (ULTRAM) 50 MG tablet Take 1 tablet (50 mg total) by mouth every 8 (eight) hours as needed for moderate pain. 20 tablet 0   zolpidem (AMBIEN CR) 12.5 MG  CR tablet TAKE 1 TABLET  BY MOUTH ONCE DAILY AT BEDTIME AS NEEDED FOR SLEEP 90 tablet 0   No current facility-administered medications on file prior to visit.    Review of Systems  Constitutional:  Positive for fatigue. Negative for activity change, appetite change, fever and unexpected weight change.  HENT:  Negative for congestion, ear pain, rhinorrhea, sinus pressure and sore throat.   Eyes:  Negative for pain, redness and visual disturbance.  Respiratory:  Negative for cough, shortness of breath and wheezing.   Cardiovascular:  Negative for chest pain and palpitations.  Gastrointestinal:  Negative for abdominal pain, blood in stool, constipation and diarrhea.  Endocrine: Negative for polydipsia and polyuria.  Genitourinary:  Negative for dysuria, frequency and urgency.  Musculoskeletal:  Negative for arthralgias, back pain and myalgias.  Skin:  Negative for pallor and rash.  Allergic/Immunologic: Negative for environmental allergies.  Neurological:  Negative for dizziness, syncope and headaches.  Hematological:  Negative for adenopathy. Does not bruise/bleed easily.  Psychiatric/Behavioral:  Positive for dysphoric mood and sleep disturbance. Negative for decreased concentration, self-injury and suicidal ideas. The patient is nervous/anxious.        Objective:   Physical Exam Constitutional:      Appearance: Normal appearance. She is obese.  Eyes:     General: No scleral icterus.    Conjunctiva/sclera: Conjunctivae normal.     Pupils: Pupils are equal, round, and reactive to light.  Cardiovascular:     Rate and Rhythm: Tachycardia present.  Pulmonary:     Effort: Pulmonary effort is normal. No respiratory distress.     Breath sounds: Normal breath sounds. No wheezing.  Skin:    General: Skin is dry.  Neurological:     Mental Status: She is alert.     Cranial Nerves: No cranial nerve deficit.     Motor: No weakness.  Psychiatric:        Attention and Perception: Attention  normal.        Mood and Affect: Mood is anxious.        Speech: Speech normal.        Behavior: Behavior normal.        Cognition and Memory: Cognition and memory normal.     Comments: Mildly anxious but overall mood is improved   Candidly discusses symptoms and stressors             Assessment & Plan:   Problem List Items Addressed This Visit       Musculoskeletal and Integument   DDD (degenerative disc disease), lumbar    Saw Dr Lorelei Pont and improved with prednisone Plans to start PT in Dec May need handicapped parking renewed in [place]         Other   Depression with anxiety - Primary    Under care of psychiatry and overall did better with addition of abilify , but unfortunately it caused eye pain and vision change/ her eye doctor took her off of it  Her px provider is aware -waiting another month to try anything new   Currently continues buspar 15-30 mg tid paxil 60 mg daily  ambien cr 12.5 mg daily   She plans to return to psychiatry in a month to discuss other options  She declines counseling but may consider it later (offered referral) Strongly enc good self care

## 2021-12-26 NOTE — Assessment & Plan Note (Signed)
Under care of psychiatry and overall did better with addition of abilify , but unfortunately it caused eye pain and vision change/ her eye doctor took her off of it  Her px provider is aware -waiting another month to try anything new   Currently continues buspar 15-30 mg tid paxil 60 mg daily  ambien cr 12.5 mg daily   She plans to return to psychiatry in a month to discuss other options  She declines counseling but may consider it later (offered referral) Strongly enc good self care

## 2021-12-26 NOTE — Patient Instructions (Signed)
Take care of yourself  Be as active as you can be safely   It may be a good idea to return to psychiatry to see what other medicine options  If you want to see a counselor let us know   Think about self care and doing things for yourself

## 2021-12-30 ENCOUNTER — Other Ambulatory Visit: Payer: Self-pay | Admitting: Family Medicine

## 2022-01-10 ENCOUNTER — Other Ambulatory Visit: Payer: Self-pay | Admitting: Cardiovascular Disease

## 2022-01-10 ENCOUNTER — Other Ambulatory Visit: Payer: Self-pay | Admitting: Pulmonary Disease

## 2022-01-10 NOTE — Telephone Encounter (Signed)
Prescription refill request for Eliquis received.  Indication: afib  Last office visit: 12/12/2021 Scr: 0.57, 08/31/2021 Age: 71 yo  Weight: 90.9 kg   Refill sent.

## 2022-01-10 NOTE — Telephone Encounter (Signed)
I have notified the patient. She will contact Dr. Donivan Scull office for a refill.

## 2022-01-10 NOTE — Telephone Encounter (Signed)
She is taking Eliquis because of atrial fibrillation.  When she last saw me I found out she was in atrial fibrillation and refer her to Dr. Rockey Situ.  Dr. Rockey Situ asked me to start her on Eliquis before he could see her.  I also stated in the note that all refills with regards to her cardiac medications and Eliquis would have to come from Dr. Rockey Situ.  I would defer this to the cardiology team.

## 2022-01-10 NOTE — Telephone Encounter (Signed)
Dr. Patsey Berthold, please advise on if we should refill this?

## 2022-01-13 DIAGNOSIS — I4819 Other persistent atrial fibrillation: Secondary | ICD-10-CM | POA: Diagnosis not present

## 2022-01-13 DIAGNOSIS — R0602 Shortness of breath: Secondary | ICD-10-CM | POA: Diagnosis not present

## 2022-01-16 ENCOUNTER — Ambulatory Visit: Payer: Medicare Other | Attending: Family Medicine | Admitting: Physical Therapy

## 2022-01-16 ENCOUNTER — Encounter: Payer: Self-pay | Admitting: Physical Therapy

## 2022-01-16 DIAGNOSIS — M5459 Other low back pain: Secondary | ICD-10-CM | POA: Diagnosis not present

## 2022-01-16 DIAGNOSIS — M25552 Pain in left hip: Secondary | ICD-10-CM | POA: Diagnosis not present

## 2022-01-16 DIAGNOSIS — R262 Difficulty in walking, not elsewhere classified: Secondary | ICD-10-CM | POA: Insufficient documentation

## 2022-01-16 DIAGNOSIS — M6281 Muscle weakness (generalized): Secondary | ICD-10-CM | POA: Insufficient documentation

## 2022-01-16 DIAGNOSIS — G8929 Other chronic pain: Secondary | ICD-10-CM | POA: Diagnosis not present

## 2022-01-16 DIAGNOSIS — M5431 Sciatica, right side: Secondary | ICD-10-CM | POA: Diagnosis not present

## 2022-01-16 DIAGNOSIS — M25551 Pain in right hip: Secondary | ICD-10-CM | POA: Insufficient documentation

## 2022-01-16 DIAGNOSIS — M549 Dorsalgia, unspecified: Secondary | ICD-10-CM | POA: Insufficient documentation

## 2022-01-16 DIAGNOSIS — M5137 Other intervertebral disc degeneration, lumbosacral region: Secondary | ICD-10-CM | POA: Insufficient documentation

## 2022-01-16 NOTE — Therapy (Signed)
OUTPATIENT PHYSICAL THERAPY EVALUATION   Patient Name: Anita Harrell MRN: [id number] DOB:1950-12-15, 71 y.o., female Today's Date: 01/16/2022   PT End of Session - 01/16/22 1643     Visit Number 1    Number of Visits 24    Date for PT Re-Evaluation 04/10/22    Authorization Type MEDICARE PART B reporting period from 01/16/2022    Progress Note Due on Visit 10    PT Start Time 1505    PT Stop Time 1605    PT Time Calculation (min) 60 min    Activity Tolerance Patient tolerated treatment well    Behavior During Therapy [facility] for tasks assessed/performed             Past Medical History:  Diagnosis Date   Allergy    allergic rhinitis   Anxiety    Back pain    Chest pain    Depression    Epigastric pain    Goiter    History of tobacco abuse    Hyperglycemia    mild   Hyperlipidemia    Hypertension    Insomnia    Hx of   Labile blood pressure    Panic disorder    History of   Personal history of colonic polyps 06/09/2004   SVD (spontaneous vaginal delivery)    x 2   Past Surgical History:  Procedure Laterality Date   ABDOMINAL HYSTERECTOMY N/A 09/24/2012   Procedure: HYSTERECTOMY ABDOMINAL;  Surgeon: Gus Height, MD;  Location: [facility];  Service: Gynecology;  Laterality: N/A;   BREAST BIOPSY     COLONOSCOPY     COLONOSCOPY  2017   ELBOW SURGERY  2004   EYE SURGERY     bilateral lasik   SALPINGOOPHORECTOMY Bilateral 09/24/2012   Procedure: SALPINGO OOPHORECTOMY;  Surgeon: Gus Height, MD;  Location: [facility];  Service: Gynecology;  Laterality: Bilateral;   THYROID SURGERY     B9 massess   WART FULGURATION N/A 09/24/2012   Procedure: FULGURATION VAGINAL WART;  Surgeon: Gus Height, MD;  Location: [facility];  Service: Gynecology;  Laterality: N/A;   WISDOM TOOTH EXTRACTION     Patient Active Problem List   Diagnosis Date Noted   DDD (degenerative disc disease), lumbar 12/26/2021   Bilateral hip bursitis 10/05/2021   Atrial fibrillation, persistent ([place]) 10/05/2021    Sleep walking 10/05/2021   Cerumen impaction 06/21/2021   Estrogen deficiency 06/07/2021   Fatigue 06/21/2020   Overactive bladder 10/17/2019   Medicare annual wellness visit, subsequent 01/16/2019   Thyroid nodule 05/01/2018   Electronic cigarette use 04/03/2018   Obesity (BMI 30-39.9) 04/03/2018   Lung mass 02/01/2018   Abnormal chest x-ray 01/29/2018   Smokers' cough ([place]) 01/01/2018   Tachycardia 11/01/2015   Microscopic hematuria 08/13/2012   Routine general medical examination at a health care facility 07/22/2012   Leukocytosis 03/30/2010   Prediabetes 02/21/2008   HYPERTENSION, BENIGN ESSENTIAL 01/22/2008   ALLERGIC RHINITIS 07/25/2007   BACK PAIN 06/26/2007   Personal history of goiter 07/27/2006   Hyperlipidemia 07/27/2006   PANIC DISORDER 07/27/2006   Former smoker 07/27/2006   Depression with anxiety 07/27/2006   Insomnia 07/27/2006   Personal history of colonic polyps 06/09/2004    PCP: Abner Greenspan, MD   REFERRING PROVIDER: Owens Loffler, MD  REFERRING DIAG: lumbosacral degenerative disc disease, chronic back pain greater than 3 months duration  Rationale for Evaluation and Treatment: Rehabilitation  THERAPY DIAG:  Other low back pain  Sciatica, right side  Pain in right hip  Pain in left hip  Muscle weakness (generalized)  Difficulty in walking, not elsewhere classified  ONSET DATE: chronic over many years, remembers original back pain starting when she was approximately 71 years old.   SUBJECTIVE:                                                                                                                                                                                           SUBJECTIVE STATEMENT: Patient reports she is here due to low back pain over a year or more. She states she went to a chiropractor who helped for a while but she could not see it helping long term so she went to the doctor. The doctor said she had arthritis in her  back and sent her to PT. He prescribed muscle relaxers but she has not been taking them that much. She tried them out and it made her feel drowsy. She thinks she took some pain pills in the last year and that helped some. She denies any distinct injury. Her back pain has just gotten worse over time. She states she had sciatic maybe 5 years ago into the right buttock, and an injection helped with that. She was hoping a shot would help this time as well. She did receive a shot on the right side close to September 20th and it did help and made things bearable. However the benefits wore off last week. She also took a prednisone taper that helped at the time but the improvement did not last. She states her back has always been pretty bad in the last few years and she just thought it was something she would need to live with like her family members that have had trouble. Her mother gradually became very immobile due to similar back problems. When she is going in the grocery store, she has to push a buggy. She has to be very careful to keep from falling. She did not have back problems until she retired and was in her 68s. The back pain just hit her one day when she was walking along in the kitchen. She reports paresthesia in her left arm sometimes. She had surgery on her ulnar nerve on that side. She states her reflexes have "always been very good" since she was young.   PERTINENT HISTORY:  Patient is a 71 y.o. female who presents to outpatient physical therapy with a referral for medical diagnosis lumbosacral degenerative disc disease, chronic back pain greater than 3 months duration. This patient's chief complaints consist of chronic bilateral low back pain (R > L) radiating to right glute/hip region leading  to the following functional deficits: difficulty with "it all" including walking, standing, shopping, preparing meals, traveling, visiting her beach house, prolonged sitting, stairs, being active, transfers, bed  mobility, etc. Relevant past medical history and comorbidities include new afib (new, being worked by cardiologist, on eliquis), emphysema, anxiety, depression, goiter, HTN, insomnia, epigastric pain, lung mass in left lung (stable in 3 years), prediabetes, osteopenia, does endorse increased loose stool and gas recently, memory (self-described).  Patient denies hx of cancer, stroke, seizures, diabetes, unexplained weight loss, unexplained stumbling or dropping things, osteoporosis, and spinal surgery   PAIN:  Are you having pain? Yes: NPRS scale: Current: 0/10 in sitting,  Best: 0/10, Worst: 8/10. Pain location: constant at right low back and posterior glute, intermittently to left low back and glute. Does not go below R buttocks.  Pain description: sharp pain like "every step I am walking someone is hitting me on the back with a fist" Aggravating factors: standing, moving around, trying to cook supper, walking, putting weight on right leg, riding in the car, going up and down steps, prolonged sitting..  Relieving factors: ice, sitting, laying down flat, pushing buggy at the store.   FUNCTIONAL LIMITATIONS: difficulty with "it all" including walking, standing, shopping, preparing meals, traveling, visiting her beach house, prolonged sitting, stairs, being active, transfers, bed mobility, etc.    PRECAUTIONS: None  WEIGHT BEARING RESTRICTIONS: No  FALLS:  Has patient fallen in last 6 months? No, but she states she is very careful not to fall by watching every step she takes.   LIVING ENVIRONMENT: Lives with: lives with their spouse Lives in: House/apartment, 5 steps to get to the main house. Has bilateral handrails and can hold both.  Stairs: no Has following equipment at home: Single point cane and Grab bars, grab bars around tub, might have a walker.  She takes baths in a tub/shower combo.   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    LEISURE: 2 dogs, used to be very active   PLOF: Independent  PATIENT GOALS: "to be as pain free as possible"  NEXT MD VISIT: can call to make an appointment if needed. Usually get it within a week.   OBJECTIVE  DIAGNOSTIC FINDINGS:  R hip xray report 09/08/2020: CLINICAL DATA:  71 year old female with right lateral hip pain.   EXAM: DG HIP (WITH OR WITHOUT PELVIS) 2-3V RIGHT   COMPARISON:  None.   FINDINGS: There is no evidence of hip fracture or dislocation. There is no evidence of arthropathy or other focal bone abnormality.   IMPRESSION: No acute fracture, malalignment, or significant degenerative change.     Electronically Signed   By: Ruthann Cancer MD   On: 09/08/2020 13:22  SELF- REPORTED FUNCTION FOTO score: 32/100 (lumbar spine questionnaire)  OBSERVATION/INSPECTION Posture Posture (seated): leaning towards left side.  Posture (standing): slight left lateral shift, increased thoracic kyphosis, decreased lumbar lordosis.  Anthropometrics Tremor: mild throughout body Body composition: BMI 36.6 Muscle bulk: no gross asymmetry noted Functional Mobility Bed mobility: supine <> sit supervision to min A on flat plinth.  Transfers: sit <> stand mod I for increased time, effort, use of B UE on knees.  Gait: slow careful steps but able to ambulate household and short community distance without AD. More detailed gait analysis to be performed at later date as appropriate.   SPINE MOTION  LUMBAR SPINE AROM *Indicates pain Flexion: fingers to distal tibia, increased R low back pain, very slow and careful.  Extension: 50% until painful both sides on low back R > L.  Side Flexion:   R fingers to proximal patella, right sided pain.   L fingers to proximal patella, no pain.  Rotation:  R 25% concordant pain! L 25% no pain  NEUROLOGICAL  Upper Motor Neuron Screen Babinski, Clonus (ankle) negative bilaterally.  Hoffman's positive bilaterally.  Dermatomes L2-S2  appears equal and intact to light touch. Deep Tendon Reflexes R/L  2+/0+ Biceps brachii reflex (C5, C6) 3+/3+ Quadriceps reflex (L4) 3+/3+ Achilles reflex (S1)  PERIPHERAL JOINT MOTION (in degrees) PASSIVE RANGE OF MOTION (PROM) B LE grossly WNL except lacks hip extension bilaterally with discomfort with hip extension.   MUSCLE PERFORMANCE (MMT):  *Indicates pain 01/16/22 Date Date  Joint/Motion R/L R/L R/L  Hip     Flexion (L1, L2) 5/5 / /  Extension (knee ext) 4/4 / /  Abduction 3+/4 / /  Knee     Extension (L3) 5/5 / /  Flexion (S2) 5/5 / /  Ankle/Foot     Dorsiflexion (L4) 5/5 / /  Great toe extension (L5) 5/5 / /  Eversion (S1) 5/5 / /  Plantarflexion (S1) 4/4 / /  Comments:  01/16/2022: able to rise up on both toes, but unable to take steps without heels falling. Able to heel walk with effort and B UE support.   SPECIAL TESTS:  LOWER LIMB NEURODYNAMIC TESTS Straight Leg Raise (Sciatic nerve)  R  = positive for ipsilateral concordant pain  L  = positive for ipsilateral concordant pain.   ACCESSORY MOTION: Tender to CPA at lower thoracic thorough lumbar region.   PALPATION: TTP at bilateral greater trochanter and less so over glute med. Some in posterior right hip/glute and R>L lumbar paraspinals with increased tension.   REPEATED MOTIONS TESTING: Prone press up 1x3 feels a little more (R low back/glute > L low back)  during but no worse after.   FUNCTIONAL/BALANCE TESTS: Five Time Sit to Stand (5TSTS): 14.4 seconds from 18.5 inch plinth with hands on knees.    TODAY'S TREATMENT:                                                                                                                              education  PATIENT EDUCATION:  Education details: Education on diagnosis, prognosis, POC, anatomy and physiology of current condition.  Person educated: Patient Education method: Explanation Education comprehension: verbalized understanding and would benefit  from reinforcement.   HOME EXERCISE PROGRAM: TBD  ASSESSMENT:  CLINICAL IMPRESSION: Patient is a 71 y.o. female referred to outpatient physical therapy with a medical diagnosis of lumbosacral degenerative disc disease, chronic back pain greater than 3 months duration who presents with signs and symptoms consistent with chronic low back pain radiating into right  glute/hip and bilateral lateral hip pain (likely due to greater trochanteric pain syndrome) and generalized deconditioning with increased fall risk. Patient's subjective report suggests pattern of symptomatic lumbar stenosis but  lack of significant worsening with lumbar extension and lack of leg dominant symptoms decrease the likelihood of this reason. Patient is very deconditioned and at risk for falls with weakness and pain in bilateral lateral hips which is likely contributing to her chronic back pain. Improving motion and strength in the core and back muscles as well as improving LE strength, activity tolerance, and balance would likely help modulate back pain and return to meaningful function. Patient is also actively struggling with anxiety and depression which are significant risk factors for chronic low back pian. Patient presents with significant pain, balance, ROM, joint stiffness, motor control, muscle tension, muscle performance (strength/power/endurance), knowledge, gait, and activity tolerance impairments that are limiting ability to complete "it all" including walking, standing, shopping, preparing meals, traveling, visiting her beach house, prolonged sitting, stairs, being active, transfers, bed mobility, etc, without difficulty. Patient will benefit from skilled physical therapy intervention to address current body structure impairments and activity limitations to improve function and work towards goals set in current POC in order to return to prior level of function or maximal functional improvement.    OBJECTIVE IMPAIRMENTS:  Abnormal gait, decreased activity tolerance, decreased balance, decreased cognition, decreased coordination, decreased endurance, decreased knowledge of condition, decreased knowledge of use of DME, decreased mobility, difficulty walking, decreased ROM, decreased strength, hypomobility, impaired perceived functional ability, increased muscle spasms, impaired flexibility, improper body mechanics, postural dysfunction, obesity, and pain.   ACTIVITY LIMITATIONS: carrying, lifting, bending, sitting, standing, squatting, sleeping, stairs, transfers, bed mobility, bathing, toileting, dressing, and hygiene/grooming  PARTICIPATION LIMITATIONS: meal prep, cleaning, laundry, driving, shopping, community activity, occupation, yard work, and   "it all" including walking, standing, shopping, preparing meals, traveling, visiting her beach house, prolonged sitting, stairs, being active, transfers, bed mobility, etc  PERSONAL FACTORS: Fitness, Past/current experiences, Time since onset of injury/illness/exacerbation, and 3+ comorbidities:   new afib (new, being worked by cardiologist, on eliquis), emphysema, anxiety, depression, goiter, HTN, insomnia, epigastric pain, lung mass in left lung (stable in 3 years), prediabetes, osteopenia, does endorse increased loose stool and gas recently, memory (self-described) are also affecting patient's functional outcome.   REHAB POTENTIAL: Good  CLINICAL DECISION MAKING: Evolving/moderate complexity  EVALUATION COMPLEXITY: Moderate  GOALS: Goals reviewed with patient? No  SHORT TERM GOALS: Target date: 01/30/2022  Patient will be independent with initial home exercise program for self-management of symptoms. Baseline: Initial HEP to be provided at visit 2 as appropriate (01/16/22); Goal status: INITIAL   LONG TERM GOALS: Target date: 04/10/2022  Patient will be independent with a long-term home exercise program for self-management of symptoms.  Baseline: Initial HEP  to be provided at visit 2 as appropriate (01/16/22); Goal status: INITIAL  2.  Patient will demonstrate improved FOTO to equal or greater than 49 by visit #12 to demonstrate improvement in overall condition and self-reported functional ability.  Baseline: 32 (01/16/22); Goal status: INITIAL  3.  Patient will ambulate equal or greater than 1545 feet to equal or exceed age matched norms for community dwelling women with LRAD to demonstrate improvement in community mobility.  Baseline: to be tested visit 2 as appropriate (01/16/22); Goal status: INITIAL  4.  Patient will demonstrate bilateral hip abduction strength equal or greater than 4+/5 on MMT to improve strength for single leg stance activities such as walking and climbing stairs.  Baseline: L 4/5, R 3+/5  (01/16/22); Goal status: INITIAL  5.  Patient will complete community, work and/or recreational activities with 50% less limitation due to current condition.  Baseline: difficulty with "it all" including walking, standing, shopping, preparing meals, traveling, visiting her beach house, prolonged sitting, stairs, being active, transfers, bed mobility, etc (01/16/22); Goal status: INITIAL  6.  Patient will complete Timed Up and Go test in equal or less than 9 seconds to equal age matched norms and demonstrate improved household mobility and low fall risk.  Baseline: to be tested visit 2 as appropriate (01/16/2022);  Goal status: INITIAL  PLAN:  PT FREQUENCY: 1-2x/week  PT DURATION: 12 weeks  PLANNED INTERVENTIONS: Therapeutic exercises, Therapeutic activity, Neuromuscular re-education, Balance training, Gait training, Patient/Family education, Self Care, Joint mobilization, Dry Needling, Electrical stimulation, Spinal mobilization, Cryotherapy, Moist heat, Manual therapy, and Re-evaluation.  PLAN FOR NEXT SESSION: update HEP as appropriate. Progressive exercise for improved lumbar motion, core, LE, and functional strength. Sciatic  nerve glides. Manual therapy as needed .   Everlean Alstrom. Graylon Good, PT, DPT 01/16/22, 4:45 PM  [facility] [address] [place], [place] [address] P: [phone] I F: [phone]

## 2022-01-17 DIAGNOSIS — F411 Generalized anxiety disorder: Secondary | ICD-10-CM | POA: Diagnosis not present

## 2022-01-17 DIAGNOSIS — F332 Major depressive disorder, recurrent severe without psychotic features: Secondary | ICD-10-CM | POA: Diagnosis not present

## 2022-01-17 DIAGNOSIS — F5101 Primary insomnia: Secondary | ICD-10-CM | POA: Diagnosis not present

## 2022-01-19 ENCOUNTER — Ambulatory Visit: Payer: Medicare Other | Admitting: Physical Therapy

## 2022-01-19 ENCOUNTER — Encounter: Payer: Self-pay | Admitting: Physical Therapy

## 2022-01-19 VITALS — BP 124/70 | HR 82

## 2022-01-19 DIAGNOSIS — M5459 Other low back pain: Secondary | ICD-10-CM

## 2022-01-19 DIAGNOSIS — M5431 Sciatica, right side: Secondary | ICD-10-CM

## 2022-01-19 DIAGNOSIS — R262 Difficulty in walking, not elsewhere classified: Secondary | ICD-10-CM | POA: Diagnosis not present

## 2022-01-19 DIAGNOSIS — M6281 Muscle weakness (generalized): Secondary | ICD-10-CM | POA: Diagnosis not present

## 2022-01-19 DIAGNOSIS — M25551 Pain in right hip: Secondary | ICD-10-CM

## 2022-01-19 DIAGNOSIS — M25552 Pain in left hip: Secondary | ICD-10-CM

## 2022-01-19 NOTE — Therapy (Signed)
OUTPATIENT PHYSICAL THERAPY TREATMENT NOTE   Patient Name: Anita Harrell MRN: [id number] DOB:[birth year], 71 y.o., female Today's Date: 01/19/2022  PCP: Abner Greenspan, MD REFERRING PROVIDER: Owens Loffler, MD   END OF SESSION:   PT End of Session - 01/19/22 1631     Visit Number 2    Number of Visits 24    Date for PT Re-Evaluation 04/10/22    Authorization Type MEDICARE PART B reporting period from 01/16/2022    Progress Note Due on Visit 10    PT Start Time 1627    PT Stop Time 1707    PT Time Calculation (min) 40 min    Activity Tolerance Patient tolerated treatment well    Behavior During Therapy [facility] for tasks assessed/performed             Past Medical History:  Diagnosis Date   Allergy    allergic rhinitis   Anxiety    Back pain    Chest pain    Depression    Epigastric pain    Goiter    History of tobacco abuse    Hyperglycemia    mild   Hyperlipidemia    Hypertension    Insomnia    Hx of   Labile blood pressure    Panic disorder    History of   Personal history of colonic polyps 06/09/2004   SVD (spontaneous vaginal delivery)    x 2   Past Surgical History:  Procedure Laterality Date   ABDOMINAL HYSTERECTOMY N/A 09/24/2012   Procedure: HYSTERECTOMY ABDOMINAL;  Surgeon: Gus Height, MD;  Location: [facility];  Service: Gynecology;  Laterality: N/A;   BREAST BIOPSY     COLONOSCOPY     COLONOSCOPY  2017   ELBOW SURGERY  2004   EYE SURGERY     bilateral lasik   SALPINGOOPHORECTOMY Bilateral 09/24/2012   Procedure: SALPINGO OOPHORECTOMY;  Surgeon: Gus Height, MD;  Location: [facility];  Service: Gynecology;  Laterality: Bilateral;   THYROID SURGERY     B9 massess   WART FULGURATION N/A 09/24/2012   Procedure: FULGURATION VAGINAL WART;  Surgeon: Gus Height, MD;  Location: [facility];  Service: Gynecology;  Laterality: N/A;   WISDOM TOOTH EXTRACTION     Patient Active Problem List   Diagnosis Date Noted   DDD (degenerative disc disease), lumbar  12/26/2021   Bilateral hip bursitis 10/05/2021   Atrial fibrillation, persistent ([place]) 10/05/2021   Sleep walking 10/05/2021   Cerumen impaction 06/21/2021   Estrogen deficiency 06/07/2021   Fatigue 06/21/2020   Overactive bladder 10/17/2019   Medicare annual wellness visit, subsequent 01/16/2019   Thyroid nodule 05/01/2018   Electronic cigarette use 04/03/2018   Obesity (BMI 30-39.9) 04/03/2018   Lung mass 02/01/2018   Abnormal chest x-ray 01/29/2018   Smokers' cough ([place]) 01/01/2018   Tachycardia 11/01/2015   Microscopic hematuria 08/13/2012   Routine general medical examination at a health care facility 07/22/2012   Leukocytosis 03/30/2010   Prediabetes 02/21/2008   HYPERTENSION, BENIGN ESSENTIAL 01/22/2008   ALLERGIC RHINITIS 07/25/2007   BACK PAIN 06/26/2007   Personal history of goiter 07/27/2006   Hyperlipidemia 07/27/2006   PANIC DISORDER 07/27/2006   Former smoker 07/27/2006   Depression with anxiety 07/27/2006   Insomnia 07/27/2006   Personal history of colonic polyps 06/09/2004    REFERRING DIAG: lumbosacral degenerative disc disease, chronic back pain greater than 3 months duration   THERAPY DIAG:  Other low back pain  Sciatica, right side  Pain in  right hip  Pain in left hip  Muscle weakness (generalized)  Difficulty in walking, not elsewhere classified  Rationale for Evaluation and Treatment: Rehabilitation  PERTINENT HISTORY: Patient is a 71 y.o. female who presents to outpatient physical therapy with a referral for medical diagnosis lumbosacral degenerative disc disease, chronic back pain greater than 3 months duration. This patient's chief complaints consist of chronic bilateral low back pain (R > L) radiating to right glute/hip region leading to the following functional deficits: difficulty with "it all" including walking, standing, shopping, preparing meals, traveling, visiting her beach house, prolonged sitting, stairs, being active, transfers, bed  mobility, etc. Relevant past medical history and comorbidities include new afib (new, being worked by cardiologist, on eliquis), emphysema, anxiety, depression, goiter, HTN, insomnia, epigastric pain, lung mass in left lung (stable in 3 years), prediabetes, osteopenia, does endorse increased loose stool and gas recently, memory (self-described).  Patient denies hx of cancer, stroke, seizures, diabetes, unexplained weight loss, unexplained stumbling or dropping things, osteoporosis, and spinal surgery   PRECAUTIONS: fall risk  SUBJECTIVE:                                                                                                                                                                                      SUBJECTIVE STATEMENT:  patient reports her back pain is like "a knife" in the right lumbar region. It was like this when she got up this morning. She states this is the side that always hurts. She states it felt a little better yesterday and though that might have been from PT performing palpation and accessory motion assessment at initial eval.    PAIN:  Are you having pain? NPRS up to 8-9/10 when walking, 0/10 when walking.    OBJECTIVE:  Vitals:   01/19/22 1632  BP: 124/70  Pulse: 82  SpO2: 95%    FUNCTIONAL/BALANCE TESTS 6 Minute Walk Test: 744 feet with no AD, with CGA. Multiple standing rests and feeling short of breath and disequilibrium at times. HR up to 134 bpm. R glute/back pain up to 6-7/10 near the end. Improving with sitting.   Timed Up and GO: 11.67 seconds (average of 3 trials) no AD but used chair arms to transfer. Felt wobbly.  Trial 1: 12 Trial 2: 11 Trial 3: 11  TODAY'S TREATMENT:  Therapeutic exercise: to centralize symptoms and improve ROM, strength, muscular endurance, and activity tolerance required for successful completion of functional activities.  - 6 minute walk test and TUG to assess  baseline (See above).  - recumbent bike level 3 and seat position 9, to assess ability to complete this at home safely. 4 min.  - standing lumbar extension over counter, 2x10 (feels good, better after).  - seated sciatic nerve glide, slider technique, 1x15 each side.  - Education on HEP including handout   Pt required multimodal cuing for proper technique and to facilitate improved neuromuscular control, strength, range of motion, and functional ability resulting in improved performance and form.    PATIENT EDUCATION:  Education details: Exercise purpose/form.  Education on HEP including handout  Person educated: Patient Education method: Explanation, demonstration, tactile and verbal cues Education comprehension: verbalized and demonstrated understanding and would benefit from reinforcement.    HOME EXERCISE PROGRAM: Access Code: [facility] URL: [email or web address] Date: 01/19/2022 Prepared by: Rosita Kea  Exercises - Recumbent Bike  - 1 x daily - 5 -10 minutes time - Standing Lumbar Extension with Counter  - 4 x daily - 2 sets - 10-15 reps - Seated Slump Nerve Glide  - 2 x daily - 1 sets - 15 reps   ASSESSMENT:   CLINICAL IMPRESSION:    From Initial PT eval 01/16/2022:  Patient is a 71 y.o. female referred to outpatient physical therapy with a medical diagnosis of lumbosacral degenerative disc disease, chronic back pain greater than 3 months duration who presents with signs and symptoms consistent with chronic low back pain radiating into right  glute/hip and bilateral lateral hip pain (likely due to greater trochanteric pain syndrome) and generalized deconditioning with increased fall risk. Patient's subjective report suggests pattern of symptomatic lumbar stenosis but lack of significant worsening with lumbar extension and lack of leg dominant symptoms decrease the likelihood of this reason. Patient is very deconditioned and at risk for falls with weakness and pain  in bilateral lateral hips which is likely contributing to her chronic back pain. Improving motion and strength in the core and back muscles as well as improving LE strength, activity tolerance, and balance would likely help modulate back pain and return to meaningful function. Patient is also actively struggling with anxiety and depression which are significant risk factors for chronic low back pian. Patient presents with significant pain, balance, ROM, joint stiffness, motor control, muscle tension, muscle performance (strength/power/endurance), knowledge, gait, and activity tolerance impairments that are limiting ability to complete "it all" including walking, standing, shopping, preparing meals, traveling, visiting her beach house, prolonged sitting, stairs, being active, transfers, bed mobility, etc, without difficulty. Patient will benefit from skilled physical therapy intervention to address current body structure impairments and activity limitations to improve function and work towards goals set in current POC in order to return to prior level of function or maximal functional improvement.      OBJECTIVE IMPAIRMENTS: Abnormal gait, decreased activity tolerance, decreased balance, decreased cognition, decreased coordination, decreased endurance, decreased knowledge of condition, decreased knowledge of use of DME, decreased mobility, difficulty walking, decreased ROM, decreased strength, hypomobility, impaired perceived functional ability, increased muscle spasms, impaired flexibility, improper body mechanics, postural dysfunction, obesity, and pain.    ACTIVITY LIMITATIONS: carrying, lifting, bending, sitting, standing, squatting, sleeping, stairs, transfers, bed mobility, bathing, toileting, dressing, and hygiene/grooming   PARTICIPATION LIMITATIONS: meal prep, cleaning, laundry, driving, shopping, community activity, occupation, yard work, and   "it all" including walking, standing, shopping,  preparing  meals, traveling, visiting her beach house, prolonged sitting, stairs, being active, transfers, bed mobility, etc   PERSONAL FACTORS: Fitness, Past/current experiences, Time since onset of injury/illness/exacerbation, and 3+ comorbidities:   new afib (new, being worked by cardiologist, on eliquis), emphysema, anxiety, depression, goiter, HTN, insomnia, epigastric pain, lung mass in left lung (stable in 3 years), prediabetes, osteopenia, does endorse increased loose stool and gas recently, memory (self-described) are also affecting patient's functional outcome.    REHAB POTENTIAL: Good   CLINICAL DECISION MAKING: Evolving/moderate complexity   EVALUATION COMPLEXITY: Moderate   GOALS: Goals reviewed with patient? No   SHORT TERM GOALS: Target date: 01/30/2022   Patient will be independent with initial home exercise program for self-management of symptoms. Baseline: Initial HEP to be provided at visit 2 as appropriate (01/16/22); Goal status: In- progress      LONG TERM GOALS: Target date: 04/10/2022   Patient will be independent with a long-term home exercise program for self-management of symptoms.  Baseline: Initial HEP to be provided at visit 2 as appropriate (01/16/22); Goal status: In-progress   2.  Patient will demonstrate improved FOTO to equal or greater than 49 by visit #12 to demonstrate improvement in overall condition and self-reported functional ability.  Baseline: 32 (01/16/22); Goal status: In-progress   3.  Patient will ambulate equal or greater than 1545 feet to equal or exceed age matched norms for community dwelling women with LRAD to demonstrate improvement in community mobility.  Baseline: to be tested visit 2 as appropriate (01/16/22); 744 feet with no AD, with CGA. Multiple standing rests and feeling short of breath and disequilibrium at times. HR up to 134 bpm. R glute/back pain up to 6-7/10 near the end. Improving with sitting (01/19/2022);  Goal status:  In-progress   4.  Patient will demonstrate bilateral hip abduction strength equal or greater than 4+/5 on MMT to improve strength for single leg stance activities such as walking and climbing stairs.  Baseline: L 4/5, R 3+/5  (01/16/22); Goal status: In-progress   5.  Patient will complete community, work and/or recreational activities with 50% less limitation due to current condition.  Baseline: difficulty with "it all" including walking, standing, shopping, preparing meals, traveling, visiting her beach house, prolonged sitting, stairs, being active, transfers, bed mobility, etc (01/16/22); Goal status: In-progress   6.  Patient will complete Timed Up and Go test in equal or less than 9 seconds to equal age matched norms and demonstrate improved household mobility and low fall risk.  Baseline: to be tested visit 2 as appropriate (01/16/2022); 11.67 seconds (average of 3 trials) no AD but used chair arms to transfer. Felt wobbly (01/19/2022);  Goal status: In-progress   PLAN:   PT FREQUENCY: 1-2x/week   PT DURATION: 12 weeks   PLANNED INTERVENTIONS: Therapeutic exercises, Therapeutic activity, Neuromuscular re-education, Balance training, Gait training, Patient/Family education, Self Care, Joint mobilization, Dry Needling, Electrical stimulation, Spinal mobilization, Cryotherapy, Moist heat, Manual therapy, and Re-evaluation.   PLAN FOR NEXT SESSION: update HEP as appropriate. Progressive exercise for improved lumbar motion, core, LE, and functional strength. Sciatic nerve glides. Manual therapy as needed .   Nancy Nordmann, PT, DPT 01/19/2022, 5:53 PM   [facility] [address] [place], [place] [address] P: [phone] I F: [phone]

## 2022-01-23 ENCOUNTER — Encounter: Payer: Self-pay | Admitting: Physical Therapy

## 2022-01-23 ENCOUNTER — Ambulatory Visit: Payer: Medicare Other | Admitting: Physical Therapy

## 2022-01-23 DIAGNOSIS — M25551 Pain in right hip: Secondary | ICD-10-CM | POA: Diagnosis not present

## 2022-01-23 DIAGNOSIS — R262 Difficulty in walking, not elsewhere classified: Secondary | ICD-10-CM

## 2022-01-23 DIAGNOSIS — M5431 Sciatica, right side: Secondary | ICD-10-CM

## 2022-01-23 DIAGNOSIS — M6281 Muscle weakness (generalized): Secondary | ICD-10-CM | POA: Diagnosis not present

## 2022-01-23 DIAGNOSIS — M25552 Pain in left hip: Secondary | ICD-10-CM | POA: Diagnosis not present

## 2022-01-23 DIAGNOSIS — M5459 Other low back pain: Secondary | ICD-10-CM | POA: Diagnosis not present

## 2022-01-23 NOTE — Therapy (Signed)
OUTPATIENT PHYSICAL THERAPY TREATMENT NOTE   Patient Name: Anita Harrell MRN: [id number] DOB:1950/04/14, 71 y.o., female Today's Date: 01/23/2022  PCP: Abner Greenspan, MD REFERRING PROVIDER: Owens Loffler, MD   END OF SESSION:   PT End of Session - 01/23/22 1523     Visit Number 3    Number of Visits 24    Date for PT Re-Evaluation 04/10/22    Authorization Type MEDICARE PART B reporting period from 01/16/2022    Progress Note Due on Visit 10    PT Start Time 1515    PT Stop Time 1555    PT Time Calculation (min) 40 min    Activity Tolerance Patient tolerated treatment well    Behavior During Therapy [facility] for tasks assessed/performed              Past Medical History:  Diagnosis Date   Allergy    allergic rhinitis   Anxiety    Back pain    Chest pain    Depression    Epigastric pain    Goiter    History of tobacco abuse    Hyperglycemia    mild   Hyperlipidemia    Hypertension    Insomnia    Hx of   Labile blood pressure    Panic disorder    History of   Personal history of colonic polyps 06/09/2004   SVD (spontaneous vaginal delivery)    x 2   Past Surgical History:  Procedure Laterality Date   ABDOMINAL HYSTERECTOMY N/A 09/24/2012   Procedure: HYSTERECTOMY ABDOMINAL;  Surgeon: Gus Height, MD;  Location: [facility];  Service: Gynecology;  Laterality: N/A;   BREAST BIOPSY     COLONOSCOPY     COLONOSCOPY  2017   ELBOW SURGERY  2004   EYE SURGERY     bilateral lasik   SALPINGOOPHORECTOMY Bilateral 09/24/2012   Procedure: SALPINGO OOPHORECTOMY;  Surgeon: Gus Height, MD;  Location: [facility];  Service: Gynecology;  Laterality: Bilateral;   THYROID SURGERY     B9 massess   WART FULGURATION N/A 09/24/2012   Procedure: FULGURATION VAGINAL WART;  Surgeon: Gus Height, MD;  Location: [facility];  Service: Gynecology;  Laterality: N/A;   WISDOM TOOTH EXTRACTION     Patient Active Problem List   Diagnosis Date Noted   DDD (degenerative disc disease), lumbar  12/26/2021   Bilateral hip bursitis 10/05/2021   Atrial fibrillation, persistent ([place]) 10/05/2021   Sleep walking 10/05/2021   Cerumen impaction 06/21/2021   Estrogen deficiency 06/07/2021   Fatigue 06/21/2020   Overactive bladder 10/17/2019   Medicare annual wellness visit, subsequent 01/16/2019   Thyroid nodule 05/01/2018   Electronic cigarette use 04/03/2018   Obesity (BMI 30-39.9) 04/03/2018   Lung mass 02/01/2018   Abnormal chest x-ray 01/29/2018   Smokers' cough ([place]) 01/01/2018   Tachycardia 11/01/2015   Microscopic hematuria 08/13/2012   Routine general medical examination at a health care facility 07/22/2012   Leukocytosis 03/30/2010   Prediabetes 02/21/2008   HYPERTENSION, BENIGN ESSENTIAL 01/22/2008   ALLERGIC RHINITIS 07/25/2007   BACK PAIN 06/26/2007   Personal history of goiter 07/27/2006   Hyperlipidemia 07/27/2006   PANIC DISORDER 07/27/2006   Former smoker 07/27/2006   Depression with anxiety 07/27/2006   Insomnia 07/27/2006   Personal history of colonic polyps 06/09/2004    REFERRING DIAG: lumbosacral degenerative disc disease, chronic back pain greater than 3 months duration   THERAPY DIAG:  Other low back pain  Sciatica, right side  Pain  in right hip  Pain in left hip  Muscle weakness (generalized)  Difficulty in walking, not elsewhere classified  Rationale for Evaluation and Treatment: Rehabilitation  PERTINENT HISTORY: Patient is a 71 y.o. female who presents to outpatient physical therapy with a referral for medical diagnosis lumbosacral degenerative disc disease, chronic back pain greater than 3 months duration. This patient's chief complaints consist of chronic bilateral low back pain (R > L) radiating to right glute/hip region leading to the following functional deficits: difficulty with "it all" including walking, standing, shopping, preparing meals, traveling, visiting her beach house, prolonged sitting, stairs, being active, transfers, bed  mobility, etc. Relevant past medical history and comorbidities include new afib (new, being worked by cardiologist, on eliquis), emphysema, anxiety, depression, goiter, HTN, insomnia, epigastric pain, lung mass in left lung (stable in 3 years), prediabetes, osteopenia, does endorse increased loose stool and gas recently, memory (self-described).  Patient denies hx of cancer, stroke, seizures, diabetes, unexplained weight loss, unexplained stumbling or dropping things, osteoporosis, and spinal surgery   PRECAUTIONS: fall risk  SUBJECTIVE:                                                                                                                                                                                      SUBJECTIVE STATEMENT:  patient reports she had no pain on Saturday and it was wonderful. She states she stayed home and did things around the house that day. She woke up on Sunday with a lot of pain and had a terrible day (hurting a lot when she got off the couch) and today it is feeling better but still hurting. She continued to do her HEP but nothing made it better. She got her husband doing lumbar extensions.    PAIN:  Are you having pain? NPRS up to 3-4/10    OBJECTIVE:  TODAY'S TREATMENT:                                                              Therapeutic exercise: to centralize symptoms and improve ROM, strength, muscular endurance, and activity tolerance required for successful completion of functional activities.  - standing lumbar extension over counter, 2x10 (feels good, feels about the same).  - standing wall sags 1x10 (pain abolished)  - prone press up 1x3 (unable to do more due to arm weakness).  - standing wall sags 1x10 (pain still abolished).  - Treadmill 1.5 mph at 0% grade with B UE  support (attempted no UE support but too unsteady). For improved lower extremity mobility, muscular endurance, and weightbearing activity tolerance; and to induce the analgesic  effect of aerobic exercise, stimulate improved joint nutrition, and prepare body structures and systems for following interventions. x 2  minutes. Required min assistance to set up/control machine and SBA for safety. Stopped due to leg fatigue and general fatigue.  - lateral stepping with YTB around ankles, 1x20 feet each side with intermittent UE support and CGA.  - forwards/backwards monster walks with YTB around ankles, 1x20 feet each direction with CGA. Stopped due to fatigue. . - standing wall sags 3x10 (pain to touch at right glute improving to no pain).  - sit <> stand from 17 inch chair, 2x10.  - standing wall sags 1x10   - Education on HEP including handout   Pt required multimodal cuing for proper technique and to facilitate improved neuromuscular control, strength, range of motion, and functional ability resulting in improved performance and form.    PATIENT EDUCATION:  Education details: Exercise purpose/form.  Education on HEP including handout  Person educated: Patient Education method: Explanation, demonstration, tactile and verbal cues Education comprehension: verbalized and demonstrated understanding and would benefit from reinforcement.    HOME EXERCISE PROGRAM: Access Code: [facility] URL: [email or web address] Date: 01/19/2022 Prepared by: Rosita Kea  Exercises - Recumbent Bike  - 1 x daily - 5 -10 minutes time - Standing Lumbar Extension with Counter  - 4 x daily - 2 sets - 10-15 reps - Seated Slump Nerve Glide  - 2 x daily - 1 sets - 15 reps   ASSESSMENT:   CLINICAL IMPRESSION: Patient arrives with right sided low back pain, better than at last visit but worse than she remembers one day since last PT session. She again responded to extension exercise to decrease and abolish her right sided back pain. She fatigued quickly and although able to stand for most of the visit, was unable to ambulate on TM for more that 2 min. Patient educated about possible  DOMS. Patient needed cuing and education to maximize improvement. Patient would benefit from continued management of limiting condition by skilled physical therapist to address remaining impairments and functional limitations to work towards stated goals and return to PLOF or maximal functional independence.   From Initial PT eval 01/16/2022:  Patient is a 71 y.o. female referred to outpatient physical therapy with a medical diagnosis of lumbosacral degenerative disc disease, chronic back pain greater than 3 months duration who presents with signs and symptoms consistent with chronic low back pain radiating into right  glute/hip and bilateral lateral hip pain (likely due to greater trochanteric pain syndrome) and generalized deconditioning with increased fall risk. Patient's subjective report suggests pattern of symptomatic lumbar stenosis but lack of significant worsening with lumbar extension and lack of leg dominant symptoms decrease the likelihood of this reason. Patient is very deconditioned and at risk for falls with weakness and pain in bilateral lateral hips which is likely contributing to her chronic back pain. Improving motion and strength in the core and back muscles as well as improving LE strength, activity tolerance, and balance would likely help modulate back pain and return to meaningful function. Patient is also actively struggling with anxiety and depression which are significant risk factors for chronic low back pian. Patient presents with significant pain, balance, ROM, joint stiffness, motor control, muscle tension, muscle performance (strength/power/endurance), knowledge, gait, and activity tolerance impairments that are limiting ability to complete "it all" including walking,  standing, shopping, preparing meals, traveling, visiting her beach house, prolonged sitting, stairs, being active, transfers, bed mobility, etc, without difficulty. Patient will benefit from skilled physical therapy  intervention to address current body structure impairments and activity limitations to improve function and work towards goals set in current POC in order to return to prior level of function or maximal functional improvement.      OBJECTIVE IMPAIRMENTS: Abnormal gait, decreased activity tolerance, decreased balance, decreased cognition, decreased coordination, decreased endurance, decreased knowledge of condition, decreased knowledge of use of DME, decreased mobility, difficulty walking, decreased ROM, decreased strength, hypomobility, impaired perceived functional ability, increased muscle spasms, impaired flexibility, improper body mechanics, postural dysfunction, obesity, and pain.    ACTIVITY LIMITATIONS: carrying, lifting, bending, sitting, standing, squatting, sleeping, stairs, transfers, bed mobility, bathing, toileting, dressing, and hygiene/grooming   PARTICIPATION LIMITATIONS: meal prep, cleaning, laundry, driving, shopping, community activity, occupation, yard work, and   "it all" including walking, standing, shopping, preparing meals, traveling, visiting her beach house, prolonged sitting, stairs, being active, transfers, bed mobility, etc   PERSONAL FACTORS: Fitness, Past/current experiences, Time since onset of injury/illness/exacerbation, and 3+ comorbidities:   new afib (new, being worked by cardiologist, on eliquis), emphysema, anxiety, depression, goiter, HTN, insomnia, epigastric pain, lung mass in left lung (stable in 3 years), prediabetes, osteopenia, does endorse increased loose stool and gas recently, memory (self-described) are also affecting patient's functional outcome.    REHAB POTENTIAL: Good   CLINICAL DECISION MAKING: Evolving/moderate complexity   EVALUATION COMPLEXITY: Moderate   GOALS: Goals reviewed with patient? No   SHORT TERM GOALS: Target date: 01/30/2022   Patient will be independent with initial home exercise program for self-management of  symptoms. Baseline: Initial HEP to be provided at visit 2 as appropriate (01/16/22); Goal status: In- progress      LONG TERM GOALS: Target date: 04/10/2022   Patient will be independent with a long-term home exercise program for self-management of symptoms.  Baseline: Initial HEP to be provided at visit 2 as appropriate (01/16/22); Goal status: In-progress   2.  Patient will demonstrate improved FOTO to equal or greater than 49 by visit #12 to demonstrate improvement in overall condition and self-reported functional ability.  Baseline: 32 (01/16/22); Goal status: In-progress   3.  Patient will ambulate equal or greater than 1545 feet to equal or exceed age matched norms for community dwelling women with LRAD to demonstrate improvement in community mobility.  Baseline: to be tested visit 2 as appropriate (01/16/22); 744 feet with no AD, with CGA. Multiple standing rests and feeling short of breath and disequilibrium at times. HR up to 134 bpm. R glute/back pain up to 6-7/10 near the end. Improving with sitting (01/19/2022);  Goal status: In-progress   4.  Patient will demonstrate bilateral hip abduction strength equal or greater than 4+/5 on MMT to improve strength for single leg stance activities such as walking and climbing stairs.  Baseline: L 4/5, R 3+/5  (01/16/22); Goal status: In-progress   5.  Patient will complete community, work and/or recreational activities with 50% less limitation due to current condition.  Baseline: difficulty with "it all" including walking, standing, shopping, preparing meals, traveling, visiting her beach house, prolonged sitting, stairs, being active, transfers, bed mobility, etc (01/16/22); Goal status: In-progress   6.  Patient will complete Timed Up and Go test in equal or less than 9 seconds to equal age matched norms and demonstrate improved household mobility and low fall risk.  Baseline: to be tested visit 2  as appropriate (01/16/2022); 11.67 seconds  (average of 3 trials) no AD but used chair arms to transfer. Felt wobbly (01/19/2022);  Goal status: In-progress   PLAN:   PT FREQUENCY: 1-2x/week   PT DURATION: 12 weeks   PLANNED INTERVENTIONS: Therapeutic exercises, Therapeutic activity, Neuromuscular re-education, Balance training, Gait training, Patient/Family education, Self Care, Joint mobilization, Dry Needling, Electrical stimulation, Spinal mobilization, Cryotherapy, Moist heat, Manual therapy, and Re-evaluation.   PLAN FOR NEXT SESSION: update HEP as appropriate. Progressive exercise for improved lumbar motion, core, LE, and functional strength. Sciatic nerve glides. Manual therapy as needed .   Nancy Nordmann, PT, DPT 01/23/2022, 4:59 PM   [facility] [address] [place], [place] [address] P: [phone] I F: [phone]

## 2022-01-25 ENCOUNTER — Encounter: Payer: Self-pay | Admitting: Physical Therapy

## 2022-01-25 ENCOUNTER — Ambulatory Visit: Payer: Medicare Other | Admitting: Physical Therapy

## 2022-01-25 DIAGNOSIS — M5459 Other low back pain: Secondary | ICD-10-CM | POA: Diagnosis not present

## 2022-01-25 DIAGNOSIS — R262 Difficulty in walking, not elsewhere classified: Secondary | ICD-10-CM | POA: Diagnosis not present

## 2022-01-25 DIAGNOSIS — M6281 Muscle weakness (generalized): Secondary | ICD-10-CM | POA: Diagnosis not present

## 2022-01-25 DIAGNOSIS — M25552 Pain in left hip: Secondary | ICD-10-CM | POA: Diagnosis not present

## 2022-01-25 DIAGNOSIS — M25551 Pain in right hip: Secondary | ICD-10-CM

## 2022-01-25 DIAGNOSIS — M5431 Sciatica, right side: Secondary | ICD-10-CM

## 2022-01-25 NOTE — Therapy (Signed)
OUTPATIENT PHYSICAL THERAPY TREATMENT NOTE   Patient Name: Anita Harrell MRN: [id number] DOB:12-19-1950, 71 y.o., female Today's Date: 01/25/2022  PCP: Abner Greenspan, MD REFERRING PROVIDER: Owens Loffler, MD   END OF SESSION:   PT End of Session - 01/25/22 1511     Visit Number 4    Number of Visits 24    Date for PT Re-Evaluation 04/10/22    Authorization Type MEDICARE PART B reporting period from 01/16/2022    Progress Note Due on Visit 10    PT Start Time 1515    PT Stop Time 1555    PT Time Calculation (min) 40 min    Activity Tolerance Patient tolerated treatment well    Behavior During Therapy [facility] for tasks assessed/performed              Past Medical History:  Diagnosis Date   Allergy    allergic rhinitis   Anxiety    Back pain    Chest pain    Depression    Epigastric pain    Goiter    History of tobacco abuse    Hyperglycemia    mild   Hyperlipidemia    Hypertension    Insomnia    Hx of   Labile blood pressure    Panic disorder    History of   Personal history of colonic polyps 06/09/2004   SVD (spontaneous vaginal delivery)    x 2   Past Surgical History:  Procedure Laterality Date   ABDOMINAL HYSTERECTOMY N/A 09/24/2012   Procedure: HYSTERECTOMY ABDOMINAL;  Surgeon: Gus Height, MD;  Location: [facility];  Service: Gynecology;  Laterality: N/A;   BREAST BIOPSY     COLONOSCOPY     COLONOSCOPY  2017   ELBOW SURGERY  2004   EYE SURGERY     bilateral lasik   SALPINGOOPHORECTOMY Bilateral 09/24/2012   Procedure: SALPINGO OOPHORECTOMY;  Surgeon: Gus Height, MD;  Location: [facility];  Service: Gynecology;  Laterality: Bilateral;   THYROID SURGERY     B9 massess   WART FULGURATION N/A 09/24/2012   Procedure: FULGURATION VAGINAL WART;  Surgeon: Gus Height, MD;  Location: [facility];  Service: Gynecology;  Laterality: N/A;   WISDOM TOOTH EXTRACTION     Patient Active Problem List   Diagnosis Date Noted   DDD (degenerative disc disease), lumbar  12/26/2021   Bilateral hip bursitis 10/05/2021   Atrial fibrillation, persistent ([place]) 10/05/2021   Sleep walking 10/05/2021   Cerumen impaction 06/21/2021   Estrogen deficiency 06/07/2021   Fatigue 06/21/2020   Overactive bladder 10/17/2019   Medicare annual wellness visit, subsequent 01/16/2019   Thyroid nodule 05/01/2018   Electronic cigarette use 04/03/2018   Obesity (BMI 30-39.9) 04/03/2018   Lung mass 02/01/2018   Abnormal chest x-ray 01/29/2018   Smokers' cough ([place]) 01/01/2018   Tachycardia 11/01/2015   Microscopic hematuria 08/13/2012   Routine general medical examination at a health care facility 07/22/2012   Leukocytosis 03/30/2010   Prediabetes 02/21/2008   HYPERTENSION, BENIGN ESSENTIAL 01/22/2008   ALLERGIC RHINITIS 07/25/2007   BACK PAIN 06/26/2007   Personal history of goiter 07/27/2006   Hyperlipidemia 07/27/2006   PANIC DISORDER 07/27/2006   Former smoker 07/27/2006   Depression with anxiety 07/27/2006   Insomnia 07/27/2006   Personal history of colonic polyps 06/09/2004    REFERRING DIAG: lumbosacral degenerative disc disease, chronic back pain greater than 3 months duration   THERAPY DIAG:  Other low back pain  Sciatica, right side  Pain  in right hip  Pain in left hip  Muscle weakness (generalized)  Difficulty in walking, not elsewhere classified  Rationale for Evaluation and Treatment: Rehabilitation  PERTINENT HISTORY: Patient is a 71 y.o. female who presents to outpatient physical therapy with a referral for medical diagnosis lumbosacral degenerative disc disease, chronic back pain greater than 3 months duration. This patient's chief complaints consist of chronic bilateral low back pain (R > L) radiating to right glute/hip region leading to the following functional deficits: difficulty with "it all" including walking, standing, shopping, preparing meals, traveling, visiting her beach house, prolonged sitting, stairs, being active, transfers, bed  mobility, etc. Relevant past medical history and comorbidities include new afib (new, being worked by cardiologist, on eliquis), emphysema, anxiety, depression, goiter, HTN, insomnia, epigastric pain, lung mass in left lung (stable in 3 years), prediabetes, osteopenia, does endorse increased loose stool and gas recently, memory (self-described).  Patient denies hx of cancer, stroke, seizures, diabetes, unexplained weight loss, unexplained stumbling or dropping things, osteoporosis, and spinal surgery   PRECAUTIONS: fall risk  SUBJECTIVE:                                                                                                                                                                                      SUBJECTIVE STATEMENT:  Patient reports she woke up this morning and had a lot of pain in her right low back as soon as she woke up and her feet hit the floor. She states she felt good yesterday and spent a lot of time bending backwards at the bar and her back felt good. She states she almost canceled her PT appointment today because the pain was so bad today. She also thought about calling her doctor and asking if she can have an injection while she continues with PT. She states she did not do any of her extension exercises today because her back was hurting too much.    PAIN:  Are you having pain? NPRS up to 7/10 in right low back up to right neck.    OBJECTIVE:  TODAY'S TREATMENT:                                                              Therapeutic exercise: to centralize symptoms and improve ROM, strength, muscular endurance, and activity tolerance required for successful completion of functional activities.  - standing wall sags 2x10 with cuing for increased ROM.(Slightly better).  - standing lumbar extension  over counter, 2x10 (slightly better).  - lateral glide at wall (left side to wall), 1x10 (worse).  - standing wall sags 1x10, 1x5 (no better) (Manual therapy - see below)   - prone lying with head of table elevated 30 degrees, 1x3 min to decrease pain in lower back. Pain abolished after manual therapy and this.  - lateral step over aerobic step, 1x10 each direction with B UE support, cuing for technique.  - step up to 4 inch step, 1x10 each side with U UE support. Cuing for foot placement and sequencing.   Manual therapy: to reduce pain and tissue tension, improve range of motion, neuromodulation, in order to promote improved ability to complete functional activities. PRONE - CPA grade II/III along lumbar and thoracic spine. Very painful over thoracic spine with pain to R side and no better with continued reps, less painful at lumbar spine. - STM to lumbar and thoracic paraspinals and superior right glute. Not TTP at right superior glute or lumbar paraspinals but is painful at right thoracic paraspinals.   Pt required multimodal cuing for proper technique and to facilitate improved neuromuscular control, strength, range of motion, and functional ability resulting in improved performance and form.    PATIENT EDUCATION:  Education details: Exercise purpose/form.  Education on HEP including handout  Person educated: Patient Education method: Explanation, demonstration, tactile and verbal cues Education comprehension: verbalized and demonstrated understanding and would benefit from reinforcement.    HOME EXERCISE PROGRAM: Access Code: [facility] URL: [email or web address] Date: 01/19/2022 Prepared by: Rosita Kea  Exercises - Recumbent Bike  - 1 x daily - 5 -10 minutes time - Standing Lumbar Extension with Counter  - 4 x daily - 2 sets - 10-15 reps - Seated Slump Nerve Glide  - 2 x daily - 1 sets - 15 reps   ASSESSMENT:   CLINICAL IMPRESSION: Patient arrives with increased low back pain with slower response to extension exercises/positions/manual today. Lateral glide made it worse and patient does not appear to have lateral component to  directional preference. Pain was abolished after CPA and light STM to lumbar and thoracic spine followed by 3 min static hold in prone lumbar extension. Patient encouraged to continue exercises for directional preference when hurting at home. Plan to continue with interventions for pain control and functional conditioning next session as appropriate. Patient would benefit from continued management of limiting condition by skilled physical therapist to address remaining impairments and functional limitations to work towards stated goals and return to PLOF or maximal functional independence.    From Initial PT eval 01/16/2022:  Patient is a 71 y.o. female referred to outpatient physical therapy with a medical diagnosis of lumbosacral degenerative disc disease, chronic back pain greater than 3 months duration who presents with signs and symptoms consistent with chronic low back pain radiating into right  glute/hip and bilateral lateral hip pain (likely due to greater trochanteric pain syndrome) and generalized deconditioning with increased fall risk. Patient's subjective report suggests pattern of symptomatic lumbar stenosis but lack of significant worsening with lumbar extension and lack of leg dominant symptoms decrease the likelihood of this reason. Patient is very deconditioned and at risk for falls with weakness and pain in bilateral lateral hips which is likely contributing to her chronic back pain. Improving motion and strength in the core and back muscles as well as improving LE strength, activity tolerance, and balance would likely help modulate back pain and return to meaningful function. Patient is also actively struggling with anxiety and  depression which are significant risk factors for chronic low back pian. Patient presents with significant pain, balance, ROM, joint stiffness, motor control, muscle tension, muscle performance (strength/power/endurance), knowledge, gait, and activity tolerance  impairments that are limiting ability to complete "it all" including walking, standing, shopping, preparing meals, traveling, visiting her beach house, prolonged sitting, stairs, being active, transfers, bed mobility, etc, without difficulty. Patient will benefit from skilled physical therapy intervention to address current body structure impairments and activity limitations to improve function and work towards goals set in current POC in order to return to prior level of function or maximal functional improvement.      OBJECTIVE IMPAIRMENTS: Abnormal gait, decreased activity tolerance, decreased balance, decreased cognition, decreased coordination, decreased endurance, decreased knowledge of condition, decreased knowledge of use of DME, decreased mobility, difficulty walking, decreased ROM, decreased strength, hypomobility, impaired perceived functional ability, increased muscle spasms, impaired flexibility, improper body mechanics, postural dysfunction, obesity, and pain.    ACTIVITY LIMITATIONS: carrying, lifting, bending, sitting, standing, squatting, sleeping, stairs, transfers, bed mobility, bathing, toileting, dressing, and hygiene/grooming   PARTICIPATION LIMITATIONS: meal prep, cleaning, laundry, driving, shopping, community activity, occupation, yard work, and   "it all" including walking, standing, shopping, preparing meals, traveling, visiting her beach house, prolonged sitting, stairs, being active, transfers, bed mobility, etc   PERSONAL FACTORS: Fitness, Past/current experiences, Time since onset of injury/illness/exacerbation, and 3+ comorbidities:   new afib (new, being worked by cardiologist, on eliquis), emphysema, anxiety, depression, goiter, HTN, insomnia, epigastric pain, lung mass in left lung (stable in 3 years), prediabetes, osteopenia, does endorse increased loose stool and gas recently, memory (self-described) are also affecting patient's functional outcome.    REHAB POTENTIAL:  Good   CLINICAL DECISION MAKING: Evolving/moderate complexity   EVALUATION COMPLEXITY: Moderate   GOALS: Goals reviewed with patient? No   SHORT TERM GOALS: Target date: 01/30/2022   Patient will be independent with initial home exercise program for self-management of symptoms. Baseline: Initial HEP to be provided at visit 2 as appropriate (01/16/22); Goal status: In- progress      LONG TERM GOALS: Target date: 04/10/2022   Patient will be independent with a long-term home exercise program for self-management of symptoms.  Baseline: Initial HEP to be provided at visit 2 as appropriate (01/16/22); Goal status: In-progress   2.  Patient will demonstrate improved FOTO to equal or greater than 49 by visit #12 to demonstrate improvement in overall condition and self-reported functional ability.  Baseline: 32 (01/16/22); Goal status: In-progress   3.  Patient will ambulate equal or greater than 1545 feet to equal or exceed age matched norms for community dwelling women with LRAD to demonstrate improvement in community mobility.  Baseline: to be tested visit 2 as appropriate (01/16/22); 744 feet with no AD, with CGA. Multiple standing rests and feeling short of breath and disequilibrium at times. HR up to 134 bpm. R glute/back pain up to 6-7/10 near the end. Improving with sitting (01/19/2022);  Goal status: In-progress   4.  Patient will demonstrate bilateral hip abduction strength equal or greater than 4+/5 on MMT to improve strength for single leg stance activities such as walking and climbing stairs.  Baseline: L 4/5, R 3+/5  (01/16/22); Goal status: In-progress   5.  Patient will complete community, work and/or recreational activities with 50% less limitation due to current condition.  Baseline: difficulty with "it all" including walking, standing, shopping, preparing meals, traveling, visiting her beach house, prolonged sitting, stairs, being active, transfers, bed mobility, etc  (  01/16/22); Goal status: In-progress   6.  Patient will complete Timed Up and Go test in equal or less than 9 seconds to equal age matched norms and demonstrate improved household mobility and low fall risk.  Baseline: to be tested visit 2 as appropriate (01/16/2022); 11.67 seconds (average of 3 trials) no AD but used chair arms to transfer. Felt wobbly (01/19/2022);  Goal status: In-progress   PLAN:   PT FREQUENCY: 1-2x/week   PT DURATION: 12 weeks   PLANNED INTERVENTIONS: Therapeutic exercises, Therapeutic activity, Neuromuscular re-education, Balance training, Gait training, Patient/Family education, Self Care, Joint mobilization, Dry Needling, Electrical stimulation, Spinal mobilization, Cryotherapy, Moist heat, Manual therapy, and Re-evaluation.   PLAN FOR NEXT SESSION: update HEP as appropriate. Progressive exercise for improved lumbar motion, core, LE, and functional strength. Sciatic nerve glides. Manual therapy as needed .   Nancy Nordmann, PT, DPT 01/25/2022, 4:00 PM   [facility] [address] [place], [place] [address] P: [phone] I F: [phone]

## 2022-01-26 ENCOUNTER — Ambulatory Visit (INDEPENDENT_AMBULATORY_CARE_PROVIDER_SITE_OTHER): Payer: Medicare Other | Admitting: Family Medicine

## 2022-01-26 ENCOUNTER — Encounter: Payer: Self-pay | Admitting: Family Medicine

## 2022-01-26 ENCOUNTER — Ambulatory Visit (INDEPENDENT_AMBULATORY_CARE_PROVIDER_SITE_OTHER)
Admission: RE | Admit: 2022-01-26 | Discharge: 2022-01-26 | Disposition: A | Discharge status: Home / Self Care | Payer: Medicare Other | Source: Ambulatory Visit | Attending: Family Medicine | Admitting: Family Medicine

## 2022-01-26 VITALS — BP 100/70 | HR 103 | Temp 97.6°F | Ht 63.5 in | Wt 206.5 lb

## 2022-01-26 DIAGNOSIS — G8929 Other chronic pain: Secondary | ICD-10-CM | POA: Diagnosis not present

## 2022-01-26 DIAGNOSIS — M545 Low back pain, unspecified: Secondary | ICD-10-CM

## 2022-01-26 DIAGNOSIS — M4316 Spondylolisthesis, lumbar region: Secondary | ICD-10-CM | POA: Diagnosis not present

## 2022-01-26 MED ORDER — TRAMADOL HCL 50 MG PO TABS: 50.0000 mg | ORAL_TABLET | Freq: Three times a day (TID) | ORAL | 2 refills | Status: DC | PRN

## 2022-01-26 NOTE — Progress Notes (Signed)
Aubriee Szeto T. Franki Stemen, MD, [place] at [facility] [place], [address]  Phone: [phone]  FAX: [phone]  Anita Harrell - 71 y.o. female  MRN [id number]  Date of Birth: [birth year]  Date: 01/26/2022  PCP: Abner Greenspan, MD  Referral: Sueanne Margarita, MD  Chief Complaint  Patient presents with   Back Pain   Subjective:   Anita Harrell is a 71 y.o. very pleasant female patient with Body mass index is 36.01 kg/m. who presents with the following:  Chronic low back pain, seen recently on 11/30/2021.  At that point, I placed her on a round of prednisone, gave her some tramadol and flexeril.  She has also been doing formal physical therapy.   Wants to get a spine injection.  Least she is thinking about it, given the severity of her ongoing pain.  She predominantly has pain entirely in the right lower back region roughly at L5.  She also has pain in the upper right buttocks.  She does not describe any radicular symptoms.  She agreed as no numbness, tingling, focal weakness.  She denies any bowel or bladder incontinence.  Pain is isolated to the low back and to a lesser extent in the right buttocks.  She has not had any trauma or injury and this is been ongoing for well more than a year.  Pain in the R low back, referred to the buttocks.  Sometimes it is a knife sticking into her.  Having a really hard time.    Recalcitrant pain.   Review of Systems is noted in the HPI, as appropriate  Objective:   BP 100/70   Pulse (!) 103   Temp 97.6 F (36.4 C) (Oral)   Ht 5' 3.5" (1.613 m)   Wt 206 lb 8 oz (93.7 kg)   SpO2 96%   BMI 36.01 kg/m   GEN: No acute distress; alert,appropriate. PULM: Breathing comfortably in no respiratory distress PSYCH: Normally interactive.    Range of motion at  the waist: Flexion: normal Extension: normal Lateral bending: normal Rotation: all normal  No echymosis or  edema Rises to examination table with no difficulty Gait: non antalgic  Inspection/Deformity: N Paraspinus Tenderness: Tender to palpation from L4-S1, right greater than left  B Ankle Dorsiflexion (L5,4): 5/5 B Great Toe Dorsiflexion (L5,4): 5/5 Heel Walk (L5): WNL Toe Walk (S1): WNL Rise/Squat (L4): WNL  SENSORY B Medial Foot (L4): WNL B Dorsum (L5): WNL B Lateral (S1): WNL Light Touch: WNL  B SLR, seated: neg B SLR, supine: neg B FABER: neg B Reverse FABER: neg B Greater Troch: NT B Log Roll: neg B Sciatic Notch: NT   Laboratory and Imaging Data:  Assessment and Plan:     ICD-10-CM   1. Chronic right-sided low back pain without sciatica  M54.50 DG Lumbar Spine Complete   G89.29      Acute on chronic back pain with exacerbation.  Truthfully, she has been back pain greater than a year.  My suspicion is she has significant lumbosacral degenerative disc disease.  I did visualize this on the chest CT, this is not visualized on the entirety of the lumbar spine.  Will obtain a lumbar spine film today.  Chronic back pain is challenging, and I am doubtful she will ever be 100% pain-free.  We talked about possibly advanced imaging with an MRI and consideration of epidural steroid injection.  Or facet injection.  I  think that would be reasonable, but the patient became somewhat nervous after this conversation, even though she initially was quite optimistic.  Right now, I am going to start her on some scheduled pain medicine.  If tramadol ultimately does not work, then the patient may benefit from longer term stronger narcotics such as Vicodin or Percocet.  I will need to communicate all this with the patient's primary care doctor, as well.  Medication Management during today's office visit: Meds ordered this encounter  Medications   traMADol (ULTRAM) 50 MG tablet    Sig: Take 1-2 tablets (50-100 mg total) by mouth every 8 (eight) hours as needed for moderate pain.    Dispense:   50 tablet    Refill:  2   Medications Discontinued During This Encounter  Medication Reason   PARoxetine (PAXIL) 40 MG tablet Change in therapy   traMADol (ULTRAM) 50 MG tablet Reorder    Orders placed today for conditions managed today: Orders Placed This Encounter  Procedures   DG Lumbar Spine Complete    Disposition: No follow-ups on file.  [employer] One speech-to-text software was used for transcription in this dictation.  Possible transcriptional errors can occur using Editor, commissioning.   Signed,  [name]. Sherrise Liberto, MD   Outpatient Encounter Medications as of 01/26/2022  Medication Sig   albuterol (VENTOLIN HFA) 108 (90 Base) MCG/ACT inhaler Inhale 2 puffs into the lungs every 6 (six) hours as needed for wheezing or shortness of breath.   apixaban (ELIQUIS) 5 MG TABS tablet TAKE 1 TABLET BY MOUTH TWICE A DAY   busPIRone (BUSPAR) 30 MG tablet TAKE ONE HALF TO 1 TABLET BY MOUTH 3 TIMES DAILY AS DIRECTED   cyclobenzaprine (FLEXERIL) 10 MG tablet Take 0.5-1 tablets (5-10 mg total) by mouth at bedtime as needed for muscle spasms.   famotidine (PEPCID) 20 MG tablet Take 1 tablet (20 mg total) by mouth at bedtime.   flecainide (TAMBOCOR) 100 MG tablet Take 1 tablet (100 mg total) by mouth 2 (two) times daily.   metoprolol succinate (TOPROL-XL) 50 MG 24 hr tablet TAKE 1 TABLET BY MOUTH TWICE A DAY WITH MEALS   rosuvastatin (CRESTOR) 10 MG tablet TAKE 1 TABLET BY MOUTH ONCE A DAY   sertraline (ZOLOFT) 25 MG tablet Take 25 mg by mouth daily.   solifenacin (VESICARE) 10 MG tablet TAKE 1 TABLET BY MOUTH ONCE A DAY   zolpidem (AMBIEN CR) 12.5 MG CR tablet TAKE 1 TABLET BY MOUTH ONCE DAILY AT BEDTIME AS NEEDED FOR SLEEP   [DISCONTINUED] traMADol (ULTRAM) 50 MG tablet Take 1 tablet (50 mg total) by mouth every 8 (eight) hours as needed for moderate pain.   traMADol (ULTRAM) 50 MG tablet Take 1-2 tablets (50-100 mg total) by mouth every 8 (eight) hours as needed for moderate pain.    [DISCONTINUED] PARoxetine (PAXIL) 40 MG tablet Take 1.5 tablets (60 mg total) by mouth every morning.   No facility-administered encounter medications on file as of 01/26/2022.

## 2022-01-30 ENCOUNTER — Encounter: Payer: Medicare Other | Admitting: Physical Therapy

## 2022-01-31 DIAGNOSIS — F5101 Primary insomnia: Secondary | ICD-10-CM | POA: Diagnosis not present

## 2022-01-31 DIAGNOSIS — F411 Generalized anxiety disorder: Secondary | ICD-10-CM | POA: Diagnosis not present

## 2022-01-31 DIAGNOSIS — F332 Major depressive disorder, recurrent severe without psychotic features: Secondary | ICD-10-CM | POA: Diagnosis not present

## 2022-01-31 NOTE — Progress Notes (Unsigned)
Cardiology Office Note  Date:  02/01/2022   ID:  Anita, Harrell 10/02/1950, MRN [id number]  PCP:  Abner Greenspan, MD   Chief Complaint  Patient presents with   Follow-up    Patient reports noticing ankle and hand swelling this week.    HPI:  Anita Harrell is a 71 year old woman with past medical history of Hypertension Depression/anxiety Vapor cigarettes/former smoker 20 years Chronic shortness of breath/smoker's cough Atrial fibrillation noted on visit with pulmonary Who presents for follow-up of her paroxysmal atrial fibrillation  Last seen by myself in clinic October 2023 In atrial fibrillation on that visit, asymptomatic in clinic May 2023, was in atrial fibrillation, new onset Seen by one of our providers August 31, 2021, was in NSR  In follow-up today she reports that she continues to have shortness of breath on exertion Weight gain, previously 200 pounds now 209 Eating more but also reports having some leg swelling, possibly abdominal distention from fluid retention  Overall feels her mood is stable, previously on Abilify, now on BuSpar hallucinations better  Previously declined sleep study  EKG personally reviewed by myself on todays visit Atrial fibrillation with ventricular rate 90 bpm no significant ST-T wave changes  Past medical history reviewed Echocardiogram August 30, 2021 . Left ventricular ejection fraction, by estimation, is 55 to 60%. The  left ventricle has normal function. The left ventricle has no regional  wall motion abnormalities. Left ventricular diastolic parameters are  indeterminate.   2. Right ventricular systolic function is normal. The right ventricular  size is normal. Tricuspid regurgitation signal is inadequate for assessing  PA pressure.   3. Left atrial size was moderately dilated.   4. The mitral valve is normal in structure. Mild mitral valve  regurgitation. No evidence of mitral stenosis.   5. The aortic valve is  normal in structure. Aortic valve regurgitation is  not visualized. No aortic stenosis is present.   6. The inferior vena cava is normal in size with greater than 50%  respiratory variability, suggesting right atrial pressure of 3 mmHg.   PMH:   has a past medical history of Allergy, Anxiety, Back pain, Chest pain, Depression, Epigastric pain, Goiter, History of tobacco abuse, Hyperglycemia, Hyperlipidemia, Hypertension, Insomnia, Labile blood pressure, Panic disorder, Personal history of colonic polyps (06/09/2004), and SVD (spontaneous vaginal delivery).  PSH:    Past Surgical History:  Procedure Laterality Date   ABDOMINAL HYSTERECTOMY N/A 09/24/2012   Procedure: HYSTERECTOMY ABDOMINAL;  Surgeon: Gus Height, MD;  Location: [facility];  Service: Gynecology;  Laterality: N/A;   BREAST BIOPSY     COLONOSCOPY     COLONOSCOPY  2017   ELBOW SURGERY  2004   EYE SURGERY     bilateral lasik   SALPINGOOPHORECTOMY Bilateral 09/24/2012   Procedure: SALPINGO OOPHORECTOMY;  Surgeon: Gus Height, MD;  Location: [facility];  Service: Gynecology;  Laterality: Bilateral;   THYROID SURGERY     B9 massess   WART FULGURATION N/A 09/24/2012   Procedure: FULGURATION VAGINAL WART;  Surgeon: Gus Height, MD;  Location: [facility];  Service: Gynecology;  Laterality: N/A;   WISDOM TOOTH EXTRACTION      Current Outpatient Medications  Medication Sig Dispense Refill   albuterol (VENTOLIN HFA) 108 (90 Base) MCG/ACT inhaler Inhale 2 puffs into the lungs every 6 (six) hours as needed for wheezing or shortness of breath. 18 g 3   apixaban (ELIQUIS) 5 MG TABS tablet TAKE 1 TABLET BY MOUTH TWICE A DAY 60  tablet 2   busPIRone (BUSPAR) 30 MG tablet TAKE ONE HALF TO 1 TABLET BY MOUTH 3 TIMES DAILY AS DIRECTED 270 tablet 1   cyclobenzaprine (FLEXERIL) 10 MG tablet Take 0.5-1 tablets (5-10 mg total) by mouth at bedtime as needed for muscle spasms. 30 tablet 3   famotidine (PEPCID) 20 MG tablet Take 1 tablet (20 mg total) by mouth at  bedtime. 90 tablet 3   flecainide (TAMBOCOR) 100 MG tablet Take 1 tablet (100 mg total) by mouth 2 (two) times daily. 180 tablet 3   metoprolol succinate (TOPROL-XL) 50 MG 24 hr tablet TAKE 1 TABLET BY MOUTH TWICE A DAY WITH MEALS 180 tablet 1   rosuvastatin (CRESTOR) 10 MG tablet TAKE 1 TABLET BY MOUTH ONCE A DAY 90 tablet 0   sertraline (ZOLOFT) 25 MG tablet Take 25 mg by mouth daily.     solifenacin (VESICARE) 10 MG tablet TAKE 1 TABLET BY MOUTH ONCE A DAY 90 tablet 1   traMADol (ULTRAM) 50 MG tablet Take 1-2 tablets (50-100 mg total) by mouth every 8 (eight) hours as needed for moderate pain. 50 tablet 2   zolpidem (AMBIEN CR) 12.5 MG CR tablet TAKE 1 TABLET BY MOUTH ONCE DAILY AT BEDTIME AS NEEDED FOR SLEEP 90 tablet 0   No current facility-administered medications for this visit.    Allergies:   Abilify [aripiprazole], Codeine, Cymbalta [duloxetine hcl], Lipitor [atorvastatin], and Wellbutrin [bupropion]   Social History:  The patient  reports that she quit smoking about 13 years ago. Her smoking use included cigarettes and e-cigarettes. She has a 20.00 pack-year smoking history. She has never used smokeless tobacco. She reports that she does not drink alcohol and does not use drugs.   Family History:   family history includes Alcohol abuse in her father; Breast cancer in her sister; Cancer in her father and sister; Heart disease in her father and sister; Hypertension in her mother; Stroke in her mother.    Review of Systems: Review of Systems  Constitutional: Negative.   HENT: Negative.    Respiratory:  Positive for shortness of breath.   Cardiovascular: Negative.   Gastrointestinal: Negative.   Musculoskeletal: Negative.   Neurological: Negative.   Psychiatric/Behavioral: Negative.    All other systems reviewed and are negative.   PHYSICAL EXAM: VS:  BP (!) 140/90 (BP Location: Left Arm, Patient Position: Sitting, Cuff Size: Large)   Pulse 90   Ht '5\' 2"'$  (1.575 m)   Wt 209  lb (94.8 kg)   SpO2 97%   BMI 38.23 kg/m  , BMI Body mass index is 38.23 kg/m. Constitutional:  oriented to person, place, and time. No distress.  Obese HENT:  Head: Grossly normal Eyes:  no discharge. No scleral icterus.  Neck: No JVD, no carotid bruits  Cardiovascular: Irregularly irregular , no murmurs appreciated, trace ankle swelling Pulmonary/Chest: Clear to auscultation bilaterally, no wheezes or rails Abdominal: Soft.  no distension.  no tenderness.  Musculoskeletal: Normal range of motion Neurological:  normal muscle tone. Coordination normal. No atrophy Skin: Skin warm and dry Psychiatric: normal affect, pleasant  Recent Labs: 06/07/2021: ALT 17; TSH 2.14 08/31/2021: BUN 11; Creatinine, Ser 0.57; Hemoglobin 13.5; Platelets 120; Potassium 3.9; Sodium 138    Lipid Panel Lab Results  Component Value Date   CHOL 118 06/07/2021   HDL 41.30 06/07/2021   LDLCALC 37 06/07/2021   TRIG 197.0 (H) 06/07/2021      Wt Readings from Last 3 Encounters:  02/01/22 209 lb (94.8  kg)  01/26/22 206 lb 8 oz (93.7 kg)  12/26/21 200 lb 6 oz (90.9 kg)     ASSESSMENT AND PLAN:  Problem List Items Addressed This Visit       Cardiology Problems   HYPERTENSION, BENIGN ESSENTIAL   Hyperlipidemia   Other Visit Diagnoses     Persistent atrial fibrillation (HCC)    -  Primary   SOB (shortness of breath)       Sleep-disordered breathing       Anxiety         Paroxysmal atrial fibrillation with RVR on Eliquis, metoprolol succinate 50 daily, flecainide 100 twice daily Remains in atrial fibrillation High risk of atrial fibrillation given moderately dilated left atrium May have sleep apnea, did not want sleep study Zio monitor confirming continuous atrial fibrillation We will set up cardioversion February 09 2022  Shortness of breath Reports chronic mild shortness of breath Recommended weight loss, walking program for conditioning Will work to restore normal sinus rhythm We will  also add Lasix 20 mg daily with potassium 10 given leg swelling, 9 pound weight gain since last clinic visit with concern for fluid retention in the setting of A-fib  Anxiety Reports doing well on BuSpar   Total encounter time more than 30 minutes  Greater than 50% was spent in counseling and coordination of care with the patient    Signed, Esmond Plants, M.D., Ph.D. [place], [place]

## 2022-02-01 ENCOUNTER — Ambulatory Visit: Payer: Medicare Other | Attending: Cardiovascular Disease | Admitting: Cardiovascular Disease

## 2022-02-01 ENCOUNTER — Encounter: Payer: Self-pay | Admitting: Cardiovascular Disease

## 2022-02-01 ENCOUNTER — Other Ambulatory Visit
Admission: RE | Admit: 2022-02-01 | Discharge: 2022-02-01 | Disposition: A | Discharge status: Home / Self Care | Payer: Medicare Other | Source: Ambulatory Visit | Attending: Cardiovascular Disease | Admitting: Cardiovascular Disease

## 2022-02-01 ENCOUNTER — Other Ambulatory Visit: Payer: Self-pay | Admitting: Cardiovascular Disease

## 2022-02-01 VITALS — BP 140/90 | HR 90 | Ht 62.0 in | Wt 209.0 lb

## 2022-02-01 DIAGNOSIS — I10 Essential (primary) hypertension: Secondary | ICD-10-CM | POA: Insufficient documentation

## 2022-02-01 DIAGNOSIS — F419 Anxiety disorder, unspecified: Secondary | ICD-10-CM | POA: Diagnosis not present

## 2022-02-01 DIAGNOSIS — G4730 Sleep apnea, unspecified: Secondary | ICD-10-CM | POA: Insufficient documentation

## 2022-02-01 DIAGNOSIS — R0602 Shortness of breath: Secondary | ICD-10-CM | POA: Insufficient documentation

## 2022-02-01 DIAGNOSIS — I4819 Other persistent atrial fibrillation: Secondary | ICD-10-CM

## 2022-02-01 DIAGNOSIS — E7800 Pure hypercholesterolemia, unspecified: Secondary | ICD-10-CM | POA: Insufficient documentation

## 2022-02-01 LAB — BASIC METABOLIC PANEL
Anion gap: 8 (ref 5–15)
BUN: 10 mg/dL (ref 8–23)
CO2: 27 mmol/L (ref 22–32)
Calcium: 9 mg/dL (ref 8.9–10.3)
Chloride: 105 mmol/L (ref 98–111)
Creatinine, Ser: 0.59 mg/dL (ref 0.44–1.00)
GFR, Estimated: >60 mL/min (ref >60)
Glucose, Bld: 106 mg/dL — ABNORMAL HIGH (ref 70–99)
Potassium: 3.8 mmol/L (ref 3.5–5.1)
Sodium: 140 mmol/L (ref 135–145)

## 2022-02-01 LAB — CBC
HCT: 40.3 % (ref 36.0–46.0)
Hemoglobin: 13.5 g/dL (ref 12.0–15.0)
MCH: 29.2 pg (ref 26.0–34.0)
MCHC: 33.5 g/dL (ref 30.0–36.0)
MCV: 87.2 fL (ref 80.0–100.0)
Platelets: 144 10*3/uL — ABNORMAL LOW (ref 150–400)
RBC: 4.62 MIL/uL (ref 3.87–5.11)
RDW: 13.2 % (ref 11.5–15.5)
WBC: 9.7 10*3/uL (ref 4.0–10.5)
nRBC: 0 % (ref 0.0–0.2)

## 2022-02-01 MED ORDER — POTASSIUM CHLORIDE ER 10 MEQ PO TBCR: 10.0000 meq | EXTENDED_RELEASE_TABLET | Freq: Every day | ORAL | 0 refills | Status: DC

## 2022-02-01 MED ORDER — FUROSEMIDE 20 MG PO TABS: 20.0000 mg | ORAL_TABLET | Freq: Every day | ORAL | 0 refills | Status: DC

## 2022-02-01 NOTE — Patient Instructions (Addendum)
Medication Instructions:  START Lasix 20 mg daily with potassium 10 meq daily  If you need a refill on your cardiac medications before your next appointment, please call your pharmacy.   Lab work: BMP, CBC to be drawn today - Please go to the [facility]. You will check in at the front desk to the right as you walk into the atrium. Valet Parking is offered if needed. - No appointment needed. You may go any day between 7 am and 6 pm.   Testing/Procedures: You are scheduled for a Cardioversion on 02/09/2022 with Dr. Rockey Situ. Please arrive at the [facility] at 6:30 a.m. on the day of your procedure.  DIET INSTRUCTIONS:  Nothing to eat or drink after midnight except your medications with a sip of water.  Labs: CBC, BMP as listed above  Medications:  YOU MAY TAKE ALL of your remaining medications with a small amount of water.  Must have a responsible person to drive you home.  Bring a current list of your medications and current insurance cards.    If you have any questions after you get home, please call the office at [phone]   Follow-Up: At [facility], you and your health needs are our priority.  As part of our continuing mission to provide you with exceptional heart care, we have created designated Provider Care Teams.  These Care Teams include your primary Cardiologist (physician) and Advanced Practice Providers (APPs -  Physician Assistants and Nurse Practitioners) who all work together to provide you with the care you need, when you need it.  You will need a follow up appointment in 1 month  Providers on your designated Care Team:   Murray Hodgkins, NP Christell Faith, PA-C Cadence Kathlen Mody, [place]  COVID-19 Vaccine Information can be found at: [email or web address] For questions related to vaccine distribution or appointments, please email [email or web address] or call [phone].   02/09/22

## 2022-02-06 ENCOUNTER — Encounter: Payer: Self-pay | Admitting: Physical Therapy

## 2022-02-06 ENCOUNTER — Ambulatory Visit: Payer: Medicare Other | Admitting: Physical Therapy

## 2022-02-06 DIAGNOSIS — M5459 Other low back pain: Secondary | ICD-10-CM

## 2022-02-06 DIAGNOSIS — M25551 Pain in right hip: Secondary | ICD-10-CM

## 2022-02-06 DIAGNOSIS — M6281 Muscle weakness (generalized): Secondary | ICD-10-CM

## 2022-02-06 DIAGNOSIS — R262 Difficulty in walking, not elsewhere classified: Secondary | ICD-10-CM

## 2022-02-06 DIAGNOSIS — M25552 Pain in left hip: Secondary | ICD-10-CM

## 2022-02-06 DIAGNOSIS — M5431 Sciatica, right side: Secondary | ICD-10-CM | POA: Diagnosis not present

## 2022-02-06 NOTE — Therapy (Signed)
OUTPATIENT PHYSICAL THERAPY TREATMENT NOTE   Patient Name: Anita Harrell MRN: [id number] DOB:[birth year], 71 y.o., female Today's Date: 02/06/2022  PCP: Abner Greenspan, MD REFERRING PROVIDER: Owens Loffler, MD   END OF SESSION:   PT End of Session - 02/06/22 1556     Visit Number 5    Number of Visits 24    Date for PT Re-Evaluation 04/10/22    Authorization Type MEDICARE PART B reporting period from 01/16/2022    Progress Note Due on Visit 10    PT Start Time 1515    PT Stop Time 1555    PT Time Calculation (min) 40 min    Activity Tolerance Patient tolerated treatment well    Behavior During Therapy [facility] for tasks assessed/performed               Past Medical History:  Diagnosis Date   Allergy    allergic rhinitis   Anxiety    Back pain    Chest pain    Depression    Epigastric pain    Goiter    History of tobacco abuse    Hyperglycemia    mild   Hyperlipidemia    Hypertension    Insomnia    Hx of   Labile blood pressure    Panic disorder    History of   Personal history of colonic polyps 06/09/2004   SVD (spontaneous vaginal delivery)    x 2   Past Surgical History:  Procedure Laterality Date   ABDOMINAL HYSTERECTOMY N/A 09/24/2012   Procedure: HYSTERECTOMY ABDOMINAL;  Surgeon: Gus Height, MD;  Location: [facility];  Service: Gynecology;  Laterality: N/A;   BREAST BIOPSY     COLONOSCOPY     COLONOSCOPY  2017   ELBOW SURGERY  2004   EYE SURGERY     bilateral lasik   SALPINGOOPHORECTOMY Bilateral 09/24/2012   Procedure: SALPINGO OOPHORECTOMY;  Surgeon: Gus Height, MD;  Location: [facility];  Service: Gynecology;  Laterality: Bilateral;   THYROID SURGERY     B9 massess   WART FULGURATION N/A 09/24/2012   Procedure: FULGURATION VAGINAL WART;  Surgeon: Gus Height, MD;  Location: [facility];  Service: Gynecology;  Laterality: N/A;   WISDOM TOOTH EXTRACTION     Patient Active Problem List   Diagnosis Date Noted   DDD (degenerative disc disease), lumbar  12/26/2021   Bilateral hip bursitis 10/05/2021   Atrial fibrillation, persistent ([place]) 10/05/2021   Sleep walking 10/05/2021   Cerumen impaction 06/21/2021   Estrogen deficiency 06/07/2021   Fatigue 06/21/2020   Overactive bladder 10/17/2019   Medicare annual wellness visit, subsequent 01/16/2019   Thyroid nodule 05/01/2018   Electronic cigarette use 04/03/2018   Obesity (BMI 30-39.9) 04/03/2018   Lung mass 02/01/2018   Abnormal chest x-ray 01/29/2018   Smokers' cough ([place]) 01/01/2018   Tachycardia 11/01/2015   Microscopic hematuria 08/13/2012   Routine general medical examination at a health care facility 07/22/2012   Leukocytosis 03/30/2010   Prediabetes 02/21/2008   HYPERTENSION, BENIGN ESSENTIAL 01/22/2008   ALLERGIC RHINITIS 07/25/2007   BACK PAIN 06/26/2007   Personal history of goiter 07/27/2006   Hyperlipidemia 07/27/2006   PANIC DISORDER 07/27/2006   Former smoker 07/27/2006   Depression with anxiety 07/27/2006   Insomnia 07/27/2006   Personal history of colonic polyps 06/09/2004    REFERRING DIAG: lumbosacral degenerative disc disease, chronic back pain greater than 3 months duration   THERAPY DIAG:  Other low back pain  Sciatica, right side  Pain in right hip  Pain in left hip  Muscle weakness (generalized)  Difficulty in walking, not elsewhere classified  Rationale for Evaluation and Treatment: Rehabilitation  PERTINENT HISTORY: Patient is a 71 y.o. female who presents to outpatient physical therapy with a referral for medical diagnosis lumbosacral degenerative disc disease, chronic back pain greater than 3 months duration. This patient's chief complaints consist of chronic bilateral low back pain (R > L) radiating to right glute/hip region leading to the following functional deficits: difficulty with "it all" including walking, standing, shopping, preparing meals, traveling, visiting her beach house, prolonged sitting, stairs, being active, transfers, bed  mobility, etc. Relevant past medical history and comorbidities include new afib (new, being worked by cardiologist, on eliquis), emphysema, anxiety, depression, goiter, HTN, insomnia, epigastric pain, lung mass in left lung (stable in 3 years), prediabetes, osteopenia, does endorse increased loose stool and gas recently, memory (self-described).  Patient denies hx of cancer, stroke, seizures, diabetes, unexplained weight loss, unexplained stumbling or dropping things, osteoporosis, and spinal surgery   PRECAUTIONS: fall risk  SUBJECTIVE:                                                                                                                                                                                      SUBJECTIVE STATEMENT:  Patient states she is not doing well. She has a cardioversion scheduled on Thursday this week (due to constant afib) and will be under anesthesia. She states she was told to "take it easy" until then. She endorses taking an anticoagulant currently. She states she has been wheezing and is not up for any vigorous exercise today. She was told she has degenerative disease in her back and given pain pills that she feels are likely permanent in her life now. She states her back is feeling better and she thinks the pain pills are helping. She has been doing her extension exercises some but she has had so much company she has not done it as much.    PAIN:  Are you having pain? NPRS 0/10   OBJECTIVE:  TODAY'S TREATMENT:                                                              Therapeutic exercise: to centralize symptoms and improve ROM, strength, muscular endurance, and activity tolerance required for successful completion of functional activities.  - standing lumbar extension over counter, 2x10.  - lateral step over aerobic step, 3x8  each direction with B UE support. Cuing to step far enough to leave enough room for 2nd foot on each side. Reps limited by feeling of  "wheezing." SBA.   Therapeutic exercise: to centralize symptoms and improve ROM, strength, muscular endurance, and activity tolerance required for successful completion of functional activities.   Circuit:  - standing static balance with narrow stance on airex pad, UE support available as needed (one time), 2x60 seconds SBA - standing static balance with wide stance on airex pad with eyes closed,  UE support available as needed, 2x60 seconds SBA - standing static balance with self selected stance on airex, horizontal head rotation, 1x20 each direction with UE support available and CGA.   - standing foot steps on 8.5 inch step (side of TM), progressing from looking at feet and B UE support to looking straight ahead and attempting to take hands of bar. 3x10 each side. SBA. Cuing for how to complete.   Needed several seated and standing rests during session due to fatigue.   Pt required multimodal cuing for proper technique and to facilitate improved neuromuscular control, strength, range of motion, and functional ability resulting in improved performance and form.    PATIENT EDUCATION:  Education details: Exercise purpose/form.  Education on HEP including handout  Person educated: Patient Education method: Explanation, demonstration, tactile and verbal cues Education comprehension: verbalized and demonstrated understanding and would benefit from reinforcement.    HOME EXERCISE PROGRAM: Access Code: [facility] URL: [email or web address] Date: 01/19/2022 Prepared by: Rosita Kea  Exercises - Recumbent Bike  - 1 x daily - 5 -10 minutes time - Standing Lumbar Extension with Counter  - 4 x daily - 2 sets - 10-15 reps - Seated Slump Nerve Glide  - 2 x daily - 1 sets - 15 reps   ASSESSMENT:   CLINICAL IMPRESSION: Patient arrives with improved low back pain that did not hurt throughout session. Focused more on balance exercises today since low back pain better controlled and  patient is at increased fall risk. Patient was limited by fatigue related to afib and decreased muscle endurance. Exercises modified as needed to keep metabolic load low due to difficulty with breathing and being in afib. Plan to continue with tolerated balance and functional and core exercises next session as appropriate. Patient educated that she needs a release to start back to PT after cardioversion. Patient would benefit from continued management of limiting condition by skilled physical therapist to address remaining impairments and functional limitations to work towards stated goals and return to PLOF or maximal functional independence.   From Initial PT eval 01/16/2022:  Patient is a 71 y.o. female referred to outpatient physical therapy with a medical diagnosis of lumbosacral degenerative disc disease, chronic back pain greater than 3 months duration who presents with signs and symptoms consistent with chronic low back pain radiating into right  glute/hip and bilateral lateral hip pain (likely due to greater trochanteric pain syndrome) and generalized deconditioning with increased fall risk. Patient's subjective report suggests pattern of symptomatic lumbar stenosis but lack of significant worsening with lumbar extension and lack of leg dominant symptoms decrease the likelihood of this reason. Patient is very deconditioned and at risk for falls with weakness and pain in bilateral lateral hips which is likely contributing to her chronic back pain. Improving motion and strength in the core and back muscles as well as improving LE strength, activity tolerance, and balance would likely help modulate back pain and return to meaningful function. Patient is also  actively struggling with anxiety and depression which are significant risk factors for chronic low back pian. Patient presents with significant pain, balance, ROM, joint stiffness, motor control, muscle tension, muscle performance  (strength/power/endurance), knowledge, gait, and activity tolerance impairments that are limiting ability to complete "it all" including walking, standing, shopping, preparing meals, traveling, visiting her beach house, prolonged sitting, stairs, being active, transfers, bed mobility, etc, without difficulty. Patient will benefit from skilled physical therapy intervention to address current body structure impairments and activity limitations to improve function and work towards goals set in current POC in order to return to prior level of function or maximal functional improvement.      OBJECTIVE IMPAIRMENTS: Abnormal gait, decreased activity tolerance, decreased balance, decreased cognition, decreased coordination, decreased endurance, decreased knowledge of condition, decreased knowledge of use of DME, decreased mobility, difficulty walking, decreased ROM, decreased strength, hypomobility, impaired perceived functional ability, increased muscle spasms, impaired flexibility, improper body mechanics, postural dysfunction, obesity, and pain.    ACTIVITY LIMITATIONS: carrying, lifting, bending, sitting, standing, squatting, sleeping, stairs, transfers, bed mobility, bathing, toileting, dressing, and hygiene/grooming   PARTICIPATION LIMITATIONS: meal prep, cleaning, laundry, driving, shopping, community activity, occupation, yard work, and   "it all" including walking, standing, shopping, preparing meals, traveling, visiting her beach house, prolonged sitting, stairs, being active, transfers, bed mobility, etc   PERSONAL FACTORS: Fitness, Past/current experiences, Time since onset of injury/illness/exacerbation, and 3+ comorbidities:   new afib (new, being worked by cardiologist, on eliquis), emphysema, anxiety, depression, goiter, HTN, insomnia, epigastric pain, lung mass in left lung (stable in 3 years), prediabetes, osteopenia, does endorse increased loose stool and gas recently, memory (self-described) are  also affecting patient's functional outcome.    REHAB POTENTIAL: Good   CLINICAL DECISION MAKING: Evolving/moderate complexity   EVALUATION COMPLEXITY: Moderate   GOALS: Goals reviewed with patient? No   SHORT TERM GOALS: Target date: 01/30/2022   Patient will be independent with initial home exercise program for self-management of symptoms. Baseline: Initial HEP to be provided at visit 2 as appropriate (01/16/22); Goal status: In- progress      LONG TERM GOALS: Target date: 04/10/2022   Patient will be independent with a long-term home exercise program for self-management of symptoms.  Baseline: Initial HEP to be provided at visit 2 as appropriate (01/16/22); Goal status: In-progress   2.  Patient will demonstrate improved FOTO to equal or greater than 49 by visit #12 to demonstrate improvement in overall condition and self-reported functional ability.  Baseline: 32 (01/16/22); Goal status: In-progress   3.  Patient will ambulate equal or greater than 1545 feet to equal or exceed age matched norms for community dwelling women with LRAD to demonstrate improvement in community mobility.  Baseline: to be tested visit 2 as appropriate (01/16/22); 744 feet with no AD, with CGA. Multiple standing rests and feeling short of breath and disequilibrium at times. HR up to 134 bpm. R glute/back pain up to 6-7/10 near the end. Improving with sitting (01/19/2022);  Goal status: In-progress   4.  Patient will demonstrate bilateral hip abduction strength equal or greater than 4+/5 on MMT to improve strength for single leg stance activities such as walking and climbing stairs.  Baseline: L 4/5, R 3+/5  (01/16/22); Goal status: In-progress   5.  Patient will complete community, work and/or recreational activities with 50% less limitation due to current condition.  Baseline: difficulty with "it all" including walking, standing, shopping, preparing meals, traveling, visiting her beach house,  prolonged sitting, stairs, being  active, transfers, bed mobility, etc (01/16/22); Goal status: In-progress   6.  Patient will complete Timed Up and Go test in equal or less than 9 seconds to equal age matched norms and demonstrate improved household mobility and low fall risk.  Baseline: to be tested visit 2 as appropriate (01/16/2022); 11.67 seconds (average of 3 trials) no AD but used chair arms to transfer. Felt wobbly (01/19/2022);  Goal status: In-progress   PLAN:   PT FREQUENCY: 1-2x/week   PT DURATION: 12 weeks   PLANNED INTERVENTIONS: Therapeutic exercises, Therapeutic activity, Neuromuscular re-education, Balance training, Gait training, Patient/Family education, Self Care, Joint mobilization, Dry Needling, Electrical stimulation, Spinal mobilization, Cryotherapy, Moist heat, Manual therapy, and Re-evaluation.   PLAN FOR NEXT SESSION: update HEP as appropriate. Progressive exercise for improved lumbar motion, core, LE, and functional strength. Sciatic nerve glides. Manual therapy as needed .   Nancy Nordmann, PT, DPT 02/06/2022, 4:12 PM   [facility] [address] [place], [place] [address] P: [phone] I F: [phone]

## 2022-02-09 ENCOUNTER — Encounter: Payer: Medicare Other | Admitting: Physical Therapy

## 2022-02-09 ENCOUNTER — Ambulatory Visit: Payer: Medicare Other | Admitting: Certified Registered Nurse Anesthetist

## 2022-02-09 ENCOUNTER — Encounter
Admission: RE | Disposition: A | Discharge status: Home / Self Care | Payer: Self-pay | Source: Home / Self Care | Attending: Cardiovascular Disease

## 2022-02-09 ENCOUNTER — Encounter: Payer: Self-pay | Admitting: Cardiovascular Disease

## 2022-02-09 ENCOUNTER — Ambulatory Visit
Admission: RE | Admit: 2022-02-09 | Discharge: 2022-02-09 | Disposition: A | Discharge status: Home / Self Care | Payer: Medicare Other | Attending: Cardiovascular Disease | Admitting: Cardiovascular Disease

## 2022-02-09 ENCOUNTER — Other Ambulatory Visit: Payer: Self-pay

## 2022-02-09 DIAGNOSIS — Z87891 Personal history of nicotine dependence: Secondary | ICD-10-CM | POA: Diagnosis not present

## 2022-02-09 DIAGNOSIS — Z79899 Other long term (current) drug therapy: Secondary | ICD-10-CM | POA: Insufficient documentation

## 2022-02-09 DIAGNOSIS — I4891 Unspecified atrial fibrillation: Secondary | ICD-10-CM | POA: Insufficient documentation

## 2022-02-09 DIAGNOSIS — Z538 Procedure and treatment not carried out for other reasons: Secondary | ICD-10-CM | POA: Diagnosis not present

## 2022-02-09 DIAGNOSIS — I4819 Other persistent atrial fibrillation: Secondary | ICD-10-CM

## 2022-02-09 DIAGNOSIS — I10 Essential (primary) hypertension: Secondary | ICD-10-CM | POA: Diagnosis not present

## 2022-02-09 HISTORY — PX: CARDIOVERSION: SHX1299

## 2022-02-09 SURGERY — CARDIOVERSION
Anesthesia: General

## 2022-02-09 MED ORDER — SODIUM CHLORIDE 0.9 % IV SOLN: INTRAVENOUS | Status: DC

## 2022-02-09 NOTE — Progress Notes (Signed)
Initial 12 Lead EKG indicated Atrial Fibrillation.  During this admission, patient spontaneously converted to normal sinus rhythm.  Dr. Rockey Situ at bedside and second 12 Lead EKG done and confirmed normal sinus rhythm.  Dr. Rockey Situ spoke with patient and husband.  Patient did not receive cardioversion today secondary to spontaneous conversion to normal sinus rhythm during this visit.  See. Dr. Donivan Scull note for further details.

## 2022-02-09 NOTE — Anesthesia Procedure Notes (Signed)
Procedure Name: MAC Date/Time: 02/09/2022 7:30 AM  Performed by: Tollie Eth, CRNAPre-anesthesia Checklist: Patient identified, Emergency Drugs available, Suction available and Patient being monitored Patient Re-evaluated:Patient Re-evaluated prior to induction Oxygen Delivery Method: Nasal cannula Induction Type: IV induction Placement Confirmation: positive ETCO2

## 2022-02-09 NOTE — Anesthesia Preprocedure Evaluation (Addendum)
Anesthesia Evaluation  Patient identified by MRN, date of birth, ID band Patient awake    Reviewed: Allergy & Precautions, NPO status , Patient's Chart, lab work & pertinent test results  History of Anesthesia Complications Negative for: history of anesthetic complications  Airway Mallampati: III  TM Distance: >3 FB Neck ROM: full    Dental  (+) Chipped   Pulmonary former smoker   Pulmonary exam normal        Cardiovascular hypertension, On Medications and On Home Beta Blockers + dysrhythmias Atrial Fibrillation      Neuro/Psych  PSYCHIATRIC DISORDERS Anxiety Depression    negative neurological ROS     GI/Hepatic negative GI ROS, Neg liver ROS,,,  Endo/Other  negative endocrine ROS    Renal/GU negative Renal ROS  negative genitourinary   Musculoskeletal   Abdominal   Peds  Hematology negative hematology ROS (+)   Anesthesia Other Findings Past Medical History: No date: Allergy     Comment:  allergic rhinitis No date: Anxiety No date: Back pain No date: Chest pain No date: Depression No date: Epigastric pain No date: Goiter No date: History of tobacco abuse No date: Hyperglycemia     Comment:  mild No date: Hyperlipidemia No date: Hypertension No date: Insomnia     Comment:  Hx of No date: Labile blood pressure No date: Panic disorder     Comment:  History of 06/09/2004: Personal history of colonic polyps No date: SVD (spontaneous vaginal delivery)     Comment:  x 2  Past Surgical History: 09/24/2012: ABDOMINAL HYSTERECTOMY; N/A     Comment:  Procedure: HYSTERECTOMY ABDOMINAL;  Surgeon: Gus Height,              MD;  Location: [facility];  Service: Gynecology;  Laterality:              N/A; No date: BREAST BIOPSY No date: COLONOSCOPY 2017: COLONOSCOPY 2004: ELBOW SURGERY No date: EYE SURGERY     Comment:  bilateral lasik 09/24/2012: SALPINGOOPHORECTOMY; Bilateral     Comment:  Procedure: SALPINGO  OOPHORECTOMY;  Surgeon: Gus Height,               MD;  Location: [facility];  Service: Gynecology;  Laterality:              Bilateral; No date: THYROID SURGERY     Comment:  B9 massess 09/24/2012: WART FULGURATION; N/A     Comment:  Procedure: FULGURATION VAGINAL WART;  Surgeon: Gus Height, MD;  Location: [facility];  Service: Gynecology;                Laterality: N/A; No date: WISDOM TOOTH EXTRACTION  BMI    Body Mass Index: 36.58 kg/m      Reproductive/Obstetrics negative OB ROS                             Anesthesia Physical Anesthesia Plan  ASA: 3  Anesthesia Plan: General   Post-op Pain Management: Minimal or no pain anticipated   Induction: Intravenous  PONV Risk Score and Plan: Propofol infusion and TIVA  Airway Management Planned: Natural Airway and Nasal Cannula  Additional Equipment:   Intra-op Plan:   Post-operative Plan:   Informed Consent: I have reviewed the patients History and Physical, chart, labs and discussed the procedure including the risks, benefits and alternatives for the  proposed anesthesia with the patient or authorized representative who has indicated his/her understanding and acceptance.     Dental Advisory Given  Plan Discussed with: Anesthesiologist, CRNA and Surgeon  Anesthesia Plan Comments: (Patient consented for risks of anesthesia including but not limited to:  - adverse reactions to medications - risk of airway placement if required - damage to eyes, teeth, lips or other oral mucosa - nerve damage due to positioning  - sore throat or hoarseness - Damage to heart, brain, nerves, lungs, other parts of body or loss of life  Patient voiced understanding.)       Anesthesia Quick Evaluation

## 2022-02-11 NOTE — H&P (Signed)
Presented for cardioversion for atrial fibrillation Procedure was canceled as during her stay in specials, she converted to normal sinus rhythm No procedure performed Discharged home, no medication changes made  Signed, Esmond Plants, MD, Ph.D [facility]

## 2022-02-13 ENCOUNTER — Encounter: Payer: Medicare Other | Admitting: Physical Therapy

## 2022-02-13 ENCOUNTER — Encounter: Payer: Self-pay | Admitting: Emergency Medicine

## 2022-02-13 ENCOUNTER — Inpatient Hospital Stay
Admission: EM | Admit: 2022-02-13 | Discharge: 2022-02-15 | DRG: 190 | Disposition: A | Discharge status: Home / Self Care | Payer: Medicare Other | Attending: Internal Medicine | Admitting: Internal Medicine

## 2022-02-13 ENCOUNTER — Emergency Department: Payer: Medicare Other

## 2022-02-13 ENCOUNTER — Other Ambulatory Visit: Payer: Self-pay

## 2022-02-13 DIAGNOSIS — Z8719 Personal history of other diseases of the digestive system: Secondary | ICD-10-CM

## 2022-02-13 DIAGNOSIS — Z6837 Body mass index (BMI) 37.0-37.9, adult: Secondary | ICD-10-CM | POA: Diagnosis not present

## 2022-02-13 DIAGNOSIS — R0602 Shortness of breath: Secondary | ICD-10-CM | POA: Diagnosis not present

## 2022-02-13 DIAGNOSIS — E876 Hypokalemia: Secondary | ICD-10-CM | POA: Diagnosis not present

## 2022-02-13 DIAGNOSIS — J811 Chronic pulmonary edema: Secondary | ICD-10-CM | POA: Diagnosis not present

## 2022-02-13 DIAGNOSIS — S5012XA Contusion of left forearm, initial encounter: Secondary | ICD-10-CM | POA: Diagnosis present

## 2022-02-13 DIAGNOSIS — J9601 Acute respiratory failure with hypoxia: Secondary | ICD-10-CM | POA: Diagnosis not present

## 2022-02-13 DIAGNOSIS — I4819 Other persistent atrial fibrillation: Secondary | ICD-10-CM | POA: Diagnosis not present

## 2022-02-13 DIAGNOSIS — E785 Hyperlipidemia, unspecified: Secondary | ICD-10-CM | POA: Diagnosis present

## 2022-02-13 DIAGNOSIS — Z885 Allergy status to narcotic agent status: Secondary | ICD-10-CM | POA: Diagnosis not present

## 2022-02-13 DIAGNOSIS — I10 Essential (primary) hypertension: Secondary | ICD-10-CM | POA: Diagnosis not present

## 2022-02-13 DIAGNOSIS — Z888 Allergy status to other drugs, medicaments and biological substances status: Secondary | ICD-10-CM

## 2022-02-13 DIAGNOSIS — I5031 Acute diastolic (congestive) heart failure: Secondary | ICD-10-CM | POA: Diagnosis not present

## 2022-02-13 DIAGNOSIS — E669 Obesity, unspecified: Secondary | ICD-10-CM | POA: Diagnosis present

## 2022-02-13 DIAGNOSIS — J441 Chronic obstructive pulmonary disease with (acute) exacerbation: Secondary | ICD-10-CM | POA: Diagnosis not present

## 2022-02-13 DIAGNOSIS — E7800 Pure hypercholesterolemia, unspecified: Secondary | ICD-10-CM | POA: Diagnosis not present

## 2022-02-13 DIAGNOSIS — F32A Depression, unspecified: Secondary | ICD-10-CM | POA: Diagnosis present

## 2022-02-13 DIAGNOSIS — Z1152 Encounter for screening for COVID-19: Secondary | ICD-10-CM

## 2022-02-13 DIAGNOSIS — J449 Chronic obstructive pulmonary disease, unspecified: Secondary | ICD-10-CM | POA: Diagnosis present

## 2022-02-13 DIAGNOSIS — Z79899 Other long term (current) drug therapy: Secondary | ICD-10-CM | POA: Diagnosis not present

## 2022-02-13 DIAGNOSIS — Z801 Family history of malignant neoplasm of trachea, bronchus and lung: Secondary | ICD-10-CM

## 2022-02-13 DIAGNOSIS — F410 Panic disorder [episodic paroxysmal anxiety] without agoraphobia: Secondary | ICD-10-CM | POA: Diagnosis present

## 2022-02-13 DIAGNOSIS — I5033 Acute on chronic diastolic (congestive) heart failure: Secondary | ICD-10-CM | POA: Diagnosis present

## 2022-02-13 DIAGNOSIS — A419 Sepsis, unspecified organism: Secondary | ICD-10-CM | POA: Diagnosis not present

## 2022-02-13 DIAGNOSIS — J439 Emphysema, unspecified: Secondary | ICD-10-CM | POA: Diagnosis present

## 2022-02-13 DIAGNOSIS — Z823 Family history of stroke: Secondary | ICD-10-CM | POA: Diagnosis not present

## 2022-02-13 DIAGNOSIS — Z803 Family history of malignant neoplasm of breast: Secondary | ICD-10-CM

## 2022-02-13 DIAGNOSIS — Z7901 Long term (current) use of anticoagulants: Secondary | ICD-10-CM | POA: Diagnosis not present

## 2022-02-13 DIAGNOSIS — W19XXXA Unspecified fall, initial encounter: Secondary | ICD-10-CM | POA: Diagnosis not present

## 2022-02-13 DIAGNOSIS — Y92009 Unspecified place in unspecified non-institutional (private) residence as the place of occurrence of the external cause: Secondary | ICD-10-CM

## 2022-02-13 DIAGNOSIS — W109XXA Fall (on) (from) unspecified stairs and steps, initial encounter: Secondary | ICD-10-CM | POA: Diagnosis present

## 2022-02-13 DIAGNOSIS — F418 Other specified anxiety disorders: Secondary | ICD-10-CM | POA: Diagnosis present

## 2022-02-13 DIAGNOSIS — I4891 Unspecified atrial fibrillation: Secondary | ICD-10-CM | POA: Diagnosis present

## 2022-02-13 DIAGNOSIS — Z87891 Personal history of nicotine dependence: Secondary | ICD-10-CM | POA: Diagnosis not present

## 2022-02-13 DIAGNOSIS — Z8249 Family history of ischemic heart disease and other diseases of the circulatory system: Secondary | ICD-10-CM

## 2022-02-13 DIAGNOSIS — I110 Hypertensive heart disease with heart failure: Secondary | ICD-10-CM | POA: Diagnosis present

## 2022-02-13 LAB — CBC
HCT: 36.7 % (ref 36.0–46.0)
Hemoglobin: 12.2 g/dL (ref 12.0–15.0)
MCH: 29.9 pg (ref 26.0–34.0)
MCHC: 33.2 g/dL (ref 30.0–36.0)
MCV: 90 fL (ref 80.0–100.0)
Platelets: 144 10*3/uL — ABNORMAL LOW (ref 150–400)
RBC: 4.08 MIL/uL (ref 3.87–5.11)
RDW: 13.6 % (ref 11.5–15.5)
WBC: 12.6 10*3/uL — ABNORMAL HIGH (ref 4.0–10.5)
nRBC: 0 % (ref 0.0–0.2)

## 2022-02-13 LAB — BASIC METABOLIC PANEL
Anion gap: 6 (ref 5–15)
BUN: 13 mg/dL (ref 8–23)
CO2: 26 mmol/L (ref 22–32)
Calcium: 8.8 mg/dL — ABNORMAL LOW (ref 8.9–10.3)
Chloride: 107 mmol/L (ref 98–111)
Creatinine, Ser: 0.61 mg/dL (ref 0.44–1.00)
GFR, Estimated: >60 mL/min (ref >60)
Glucose, Bld: 119 mg/dL — ABNORMAL HIGH (ref 70–99)
Potassium: 3.2 mmol/L — ABNORMAL LOW (ref 3.5–5.1)
Sodium: 139 mmol/L (ref 135–145)

## 2022-02-13 LAB — LACTIC ACID, PLASMA: Lactic Acid, Venous: 4 mmol/L (ref 0.5–1.9)

## 2022-02-13 LAB — MAGNESIUM: Magnesium: 1.9 mg/dL (ref 1.7–2.4)

## 2022-02-13 LAB — RESP PANEL BY RT-PCR (FLU A&B, COVID) ARPGX2
Influenza A by PCR: NEGATIVE
Influenza B by PCR: NEGATIVE
SARS Coronavirus 2 by RT PCR: NEGATIVE

## 2022-02-13 LAB — BRAIN NATRIURETIC PEPTIDE: B Natriuretic Peptide: 128.9 pg/mL — ABNORMAL HIGH (ref 0.0–100.0)

## 2022-02-13 LAB — TROPONIN I (HIGH SENSITIVITY)
Troponin I (High Sensitivity): 4 ng/L (ref <18)
Troponin I (High Sensitivity): 5 ng/L (ref <18)

## 2022-02-13 LAB — D-DIMER, QUANTITATIVE: D-Dimer, Quant: 0.27 ug/mL-FEU (ref 0.00–0.50)

## 2022-02-13 LAB — PROCALCITONIN: Procalcitonin: <0.1 ng/mL

## 2022-02-13 MED ORDER — METOPROLOL SUCCINATE ER 50 MG PO TB24
50.0000 mg | ORAL_TABLET | Freq: Two times a day (BID) | ORAL | Status: DC
  Administered 2022-02-13 – 2022-02-15 (×5): 50 mg via ORAL
  Filled 2022-02-13 (×5): qty 1

## 2022-02-13 MED ORDER — ALBUTEROL SULFATE (2.5 MG/3ML) 0.083% IN NEBU
5.0000 mg | INHALATION_SOLUTION | Freq: Once | RESPIRATORY_TRACT | Status: AC
  Administered 2022-02-13: 5 mg via RESPIRATORY_TRACT
  Filled 2022-02-13 (×2): qty 6

## 2022-02-13 MED ORDER — ZOLPIDEM TARTRATE 5 MG PO TABS
5.0000 mg | ORAL_TABLET | Freq: Every evening | ORAL | Status: DC | PRN
  Administered 2022-02-13 – 2022-02-14 (×2): 5 mg via ORAL
  Filled 2022-02-13 (×2): qty 1

## 2022-02-13 MED ORDER — METHYLPREDNISOLONE SODIUM SUCC 40 MG IJ SOLR
40.0000 mg | Freq: Two times a day (BID) | INTRAMUSCULAR | Status: DC
  Administered 2022-02-13 – 2022-02-15 (×4): 40 mg via INTRAVENOUS
  Filled 2022-02-13 (×4): qty 1

## 2022-02-13 MED ORDER — AZITHROMYCIN 500 MG PO TABS
500.0000 mg | ORAL_TABLET | Freq: Every day | ORAL | Status: AC
  Administered 2022-02-13: 500 mg via ORAL
  Filled 2022-02-13: qty 1

## 2022-02-13 MED ORDER — POTASSIUM CHLORIDE 20 MEQ PO PACK
40.0000 meq | PACK | Freq: Every day | ORAL | Status: DC
  Administered 2022-02-13 – 2022-02-15 (×3): 40 meq via ORAL
  Filled 2022-02-13 (×3): qty 2

## 2022-02-13 MED ORDER — ALBUTEROL SULFATE (2.5 MG/3ML) 0.083% IN NEBU
5.0000 mg | INHALATION_SOLUTION | Freq: Once | RESPIRATORY_TRACT | Status: AC
  Administered 2022-02-13: 5 mg via RESPIRATORY_TRACT
  Filled 2022-02-13: qty 6

## 2022-02-13 MED ORDER — DM-GUAIFENESIN ER 30-600 MG PO TB12: 1.0000 | ORAL_TABLET | Freq: Two times a day (BID) | ORAL | Status: DC | PRN

## 2022-02-13 MED ORDER — BUSPIRONE HCL 10 MG PO TABS
30.0000 mg | ORAL_TABLET | Freq: Three times a day (TID) | ORAL | Status: DC
  Administered 2022-02-13 – 2022-02-15 (×7): 30 mg via ORAL
  Filled 2022-02-13: qty 6
  Filled 2022-02-13 (×2): qty 3
  Filled 2022-02-13 (×2): qty 6
  Filled 2022-02-13 (×2): qty 3

## 2022-02-13 MED ORDER — POTASSIUM CHLORIDE CRYS ER 20 MEQ PO TBCR
40.0000 meq | EXTENDED_RELEASE_TABLET | Freq: Once | ORAL | Status: AC
  Administered 2022-02-13: 40 meq via ORAL
  Filled 2022-02-13: qty 2

## 2022-02-13 MED ORDER — IPRATROPIUM BROMIDE 0.02 % IN SOLN
0.5000 mg | Freq: Once | RESPIRATORY_TRACT | Status: AC
  Administered 2022-02-13: 0.5 mg via RESPIRATORY_TRACT
  Filled 2022-02-13 (×2): qty 2.5

## 2022-02-13 MED ORDER — ACETAMINOPHEN 325 MG PO TABS
650.0000 mg | ORAL_TABLET | Freq: Four times a day (QID) | ORAL | Status: DC | PRN
  Administered 2022-02-13: 650 mg via ORAL
  Filled 2022-02-13: qty 2

## 2022-02-13 MED ORDER — HYDRALAZINE HCL 20 MG/ML IJ SOLN: 5.0000 mg | INTRAMUSCULAR | Status: DC | PRN

## 2022-02-13 MED ORDER — ROSUVASTATIN CALCIUM 10 MG PO TABS
10.0000 mg | ORAL_TABLET | Freq: Every day | ORAL | Status: DC
  Administered 2022-02-13 – 2022-02-15 (×3): 10 mg via ORAL
  Filled 2022-02-13 (×3): qty 1

## 2022-02-13 MED ORDER — IPRATROPIUM BROMIDE 0.02 % IN SOLN
0.5000 mg | Freq: Once | RESPIRATORY_TRACT | Status: AC
  Administered 2022-02-13: 0.5 mg via RESPIRATORY_TRACT
  Filled 2022-02-13: qty 2.5

## 2022-02-13 MED ORDER — IPRATROPIUM-ALBUTEROL 0.5-2.5 (3) MG/3ML IN SOLN
3.0000 mL | RESPIRATORY_TRACT | Status: DC
  Administered 2022-02-13 – 2022-02-14 (×6): 3 mL via RESPIRATORY_TRACT
  Filled 2022-02-13 (×6): qty 3

## 2022-02-13 MED ORDER — ONDANSETRON HCL 4 MG/2ML IJ SOLN: 4.0000 mg | Freq: Three times a day (TID) | INTRAMUSCULAR | Status: DC | PRN

## 2022-02-13 MED ORDER — AZITHROMYCIN 250 MG PO TABS
250.0000 mg | ORAL_TABLET | Freq: Every day | ORAL | Status: DC
  Administered 2022-02-14 – 2022-02-15 (×2): 250 mg via ORAL
  Filled 2022-02-13 (×2): qty 1

## 2022-02-13 MED ORDER — ALBUTEROL SULFATE (2.5 MG/3ML) 0.083% IN NEBU: 2.5000 mg | INHALATION_SOLUTION | RESPIRATORY_TRACT | Status: DC | PRN

## 2022-02-13 MED ORDER — FESOTERODINE FUMARATE ER 4 MG PO TB24
4.0000 mg | ORAL_TABLET | Freq: Every day | ORAL | Status: DC
  Administered 2022-02-13 – 2022-02-15 (×3): 4 mg via ORAL
  Filled 2022-02-13 (×3): qty 1

## 2022-02-13 MED ORDER — DILTIAZEM HCL-DEXTROSE 125-5 MG/125ML-% IV SOLN (PREMIX)
5.0000 mg/h | INTRAVENOUS | Status: DC
  Administered 2022-02-13 – 2022-02-14 (×2): 5 mg/h via INTRAVENOUS
  Filled 2022-02-13 (×2): qty 125

## 2022-02-13 MED ORDER — TRAMADOL HCL 50 MG PO TABS: 50.0000 mg | ORAL_TABLET | Freq: Three times a day (TID) | ORAL | Status: DC | PRN

## 2022-02-13 MED ORDER — CYCLOBENZAPRINE HCL 10 MG PO TABS: 5.0000 mg | ORAL_TABLET | Freq: Every evening | ORAL | Status: DC | PRN

## 2022-02-13 MED ORDER — FUROSEMIDE 10 MG/ML IJ SOLN
40.0000 mg | Freq: Once | INTRAMUSCULAR | Status: AC
  Administered 2022-02-13: 40 mg via INTRAVENOUS
  Filled 2022-02-13: qty 4

## 2022-02-13 MED ORDER — SODIUM CHLORIDE 0.9 % IV SOLN: INTRAVENOUS | Status: DC

## 2022-02-13 MED ORDER — SODIUM CHLORIDE 0.9 % IV BOLUS: 500.0000 mL | Freq: Once | INTRAVENOUS | Status: DC

## 2022-02-13 MED ORDER — METHYLPREDNISOLONE SODIUM SUCC 125 MG IJ SOLR
125.0000 mg | Freq: Once | INTRAMUSCULAR | Status: AC
  Administered 2022-02-13: 125 mg via INTRAVENOUS
  Filled 2022-02-13: qty 2

## 2022-02-13 MED ORDER — APIXABAN 5 MG PO TABS
5.0000 mg | ORAL_TABLET | Freq: Two times a day (BID) | ORAL | Status: DC
  Administered 2022-02-13 – 2022-02-15 (×5): 5 mg via ORAL
  Filled 2022-02-13 (×5): qty 1

## 2022-02-13 MED ORDER — FLECAINIDE ACETATE 50 MG PO TABS
100.0000 mg | ORAL_TABLET | Freq: Two times a day (BID) | ORAL | Status: DC
  Administered 2022-02-13 – 2022-02-15 (×5): 100 mg via ORAL
  Filled 2022-02-13: qty 1
  Filled 2022-02-13: qty 2
  Filled 2022-02-13: qty 1
  Filled 2022-02-13 (×2): qty 2

## 2022-02-13 MED ORDER — POTASSIUM CHLORIDE CRYS ER 20 MEQ PO TBCR: 40.0000 meq | EXTENDED_RELEASE_TABLET | Freq: Once | ORAL | Status: DC

## 2022-02-13 NOTE — ED Notes (Signed)
Pt reports dizziness when walking to the bathroom "I felt like I was going to pass out"

## 2022-02-13 NOTE — ED Triage Notes (Signed)
Patient reports shortness of breath and wheezing that has gotten progressively worse over this weekend. Ambulatory to triage, patient speaking in full sentenced but very short of breath while sitting in chair with accessory muscle use. Patient states she also tripped and fell in the middle of the night Saturday night and states she fell onto left arm and buttock but denies pain from fall. Denies LOC or hitting head with the fall.

## 2022-02-13 NOTE — ED Provider Notes (Signed)
Patient is signed out to me pending reassessment.  In brief this is a 71 year old female with history of COPD not on home oxygen who presents with several days of shortness of breath and cough.  Initially was quite dyspneic on arrival satting 85% placed on 2 L.  Per provider before me he was not moving much air.  Given DuoNeb and steroids.  Chest x-ray actually looks like volume overload.  On my assessment the patient is still on 2 L satting 92%.  She does look somewhat dyspneic with speaking.  I hear some crackles and expiratory wheezing.  Does have pitting edema.  Question whether this is new CHF.  Patient did receive another DuoNeb and is still persistently requiring oxygen so will need admission.   Rada Hay, MD 02/13/22 [phone]

## 2022-02-13 NOTE — ED Provider Notes (Signed)
[facility] Provider Note    Event Date/Time   First MD Initiated Contact with Patient 02/13/22 [phone]     (approximate)   History   Shortness of Breath   HPI  Anita Harrell is a 71 y.o. female with history of atrial fibrillation on Eliquis, emphysema, hypertension, hyperlipidemia who presents emergency department with shortness of breath and nonproductive cough since Friday, December 1.  No fevers, chest pain.  Does not wear oxygen chronically.  Sats were 85% here on room air at rest.  States she has been wheezing at home.  Pulmonologist is Dr. Patsey Berthold.   History provided by patient and family.    Past Medical History:  Diagnosis Date   Allergy    allergic rhinitis   Anxiety    Back pain    Chest pain    Depression    Epigastric pain    Goiter    History of tobacco abuse    Hyperglycemia    mild   Hyperlipidemia    Hypertension    Insomnia    Hx of   Labile blood pressure    Panic disorder    History of   Personal history of colonic polyps 06/09/2004   SVD (spontaneous vaginal delivery)    x 2    Past Surgical History:  Procedure Laterality Date   ABDOMINAL HYSTERECTOMY N/A 09/24/2012   Procedure: HYSTERECTOMY ABDOMINAL;  Surgeon: Gus Height, MD;  Location: [facility];  Service: Gynecology;  Laterality: N/A;   BREAST BIOPSY     CARDIOVERSION N/A 02/09/2022   Procedure: CARDIOVERSION;  Surgeon: Minna Merritts, MD;  Location: [facility];  Service: Cardiovascular;  Laterality: N/A;   COLONOSCOPY     COLONOSCOPY  2017   ELBOW SURGERY  2004   EYE SURGERY     bilateral lasik   SALPINGOOPHORECTOMY Bilateral 09/24/2012   Procedure: SALPINGO OOPHORECTOMY;  Surgeon: Gus Height, MD;  Location: [facility];  Service: Gynecology;  Laterality: Bilateral;   THYROID SURGERY     B9 massess   WART FULGURATION N/A 09/24/2012   Procedure: FULGURATION VAGINAL WART;  Surgeon: Gus Height, MD;  Location: [facility];  Service: Gynecology;  Laterality: N/A;    WISDOM TOOTH EXTRACTION      MEDICATIONS:  Prior to Admission medications   Medication Sig Start Date End Date Taking? Authorizing Provider  acetaminophen (TYLENOL) 650 MG CR tablet Take 650 mg by mouth every 8 (eight) hours as needed for pain.    [provider]  albuterol (VENTOLIN HFA) 108 (90 Base) MCG/ACT inhaler Inhale 2 puffs into the lungs every 6 (six) hours as needed for wheezing or shortness of breath. 11/29/21   Tyler Pita, MD  apixaban (ELIQUIS) 5 MG TABS tablet TAKE 1 TABLET BY MOUTH TWICE A DAY 01/10/22   Gollan, Kathlene November, MD  busPIRone (BUSPAR) 30 MG tablet TAKE ONE HALF TO 1 TABLET BY MOUTH 3 TIMES DAILY AS DIRECTED 11/04/21   Tower, Wynelle Fanny, MD  cyclobenzaprine (FLEXERIL) 10 MG tablet Take 0.5-1 tablets (5-10 mg total) by mouth at bedtime as needed for muscle spasms. 11/30/21   Copland, Frederico Hamman, MD  famotidine (PEPCID) 20 MG tablet Take 1 tablet (20 mg total) by mouth at bedtime. 10/26/21   Tower, Wynelle Fanny, MD  flecainide (TAMBOCOR) 100 MG tablet Take 1 tablet (100 mg total) by mouth 2 (two) times daily. 12/12/21   Minna Merritts, MD  furosemide (LASIX) 20 MG tablet Take 1 tablet (20 mg total)  by mouth daily. 02/01/22 05/02/22  Minna Merritts, MD  metoprolol succinate (TOPROL-XL) 50 MG 24 hr tablet TAKE 1 TABLET BY MOUTH TWICE A DAY WITH MEALS 11/04/21   Minna Merritts, MD  potassium chloride (KLOR-CON) 10 MEQ tablet Take 1 tablet (10 mEq total) by mouth daily. 02/01/22 05/02/22  Minna Merritts, MD  rosuvastatin (CRESTOR) 10 MG tablet TAKE 1 TABLET BY MOUTH ONCE A DAY 12/30/21   Tower, Wynelle Fanny, MD  sertraline (ZOLOFT) 25 MG tablet Take 25 mg by mouth daily. Patient not taking: Reported on 02/09/2022 01/17/22   [provider]  solifenacin (VESICARE) 10 MG tablet TAKE 1 TABLET BY MOUTH ONCE A DAY 12/08/21   Tower, Wynelle Fanny, MD  traMADol (ULTRAM) 50 MG tablet Take 1-2 tablets (50-100 mg total) by mouth every 8 (eight) hours as needed for moderate pain.  01/26/22   Copland, Frederico Hamman, MD  zolpidem (AMBIEN CR) 12.5 MG CR tablet TAKE 1 TABLET BY MOUTH ONCE DAILY AT BEDTIME AS NEEDED FOR SLEEP 12/19/21   Abner Greenspan, MD    Physical Exam   Triage Vital Signs: ED Triage Vitals  Enc Vitals Group     BP 02/13/22 0539 (!) 130/58     Pulse Rate 02/13/22 0539 65     Resp 02/13/22 0539 (!) 28     Temp 02/13/22 0539 97.7 F (36.5 C)     Temp Source 02/13/22 0539 Oral     SpO2 02/13/22 0539 (!) 85 %     Weight 02/13/22 0541 200 lb (90.7 kg)     Height 02/13/22 0541 '5\' 2"'$  (1.575 m)     Head Circumference --      Peak Flow --      Pain Score 02/13/22 0541 0     Pain Loc --      Pain Edu? --      Excl. in [place]? --     Most recent vital signs: Vitals:   02/13/22 0547 02/13/22 0556  BP:  135/67  Pulse:  73  Resp:  (!) 30  Temp:    SpO2: 97% 95%    CONSTITUTIONAL: Alert and oriented and responds appropriately to questions.  Elderly, appears dyspneic HEAD: Normocephalic, atraumatic EYES: Conjunctivae clear, pupils appear equal, sclera nonicteric ENT: normal nose; moist mucous membranes NECK: Supple, normal ROM CARD: RRR; S1 and S2 appreciated; no murmurs, no clicks, no rubs, no gallops RESP: Speaking in truncated sentences.  Hypoxic to 85% on room air.  Significantly diminished aeration with scattered scant expiratory wheeze.  No rhonchi or rales. ABD/GI: Normal bowel sounds; non-distended; soft, non-tender, no rebound, no guarding, no peritoneal signs BACK: The back appears normal EXT: Normal ROM in all joints; no deformity noted, no edema; no cyanosis, no calf tenderness or calf swelling SKIN: Normal color for age and race; warm; no rash on exposed skin NEURO: Moves all extremities equally, normal speech PSYCH: The patient's mood and manner are appropriate.   ED Results / Procedures / Treatments   LABS: (all labs ordered are listed, but only abnormal results are displayed) Labs Reviewed  BASIC METABOLIC PANEL - Abnormal; Notable  for the following components:      Result Value   Potassium 3.2 (*)    Glucose, Bld 119 (*)    Calcium 8.8 (*)    All other components within normal limits  CBC - Abnormal; Notable for the following components:   WBC 12.6 (*)    Platelets 144 (*)  All other components within normal limits  BRAIN NATRIURETIC PEPTIDE - Abnormal; Notable for the following components:   B Natriuretic Peptide 128.9 (*)    All other components within normal limits  RESP PANEL BY RT-PCR (FLU A&B, COVID) ARPGX2  D-DIMER, QUANTITATIVE  TROPONIN I (HIGH SENSITIVITY)     EKG:  EKG Interpretation  Date/Time:  Monday February 13 2022 05:48:27 EST Ventricular Rate:  71 PR Interval:  218 QRS Duration: 86 QT Interval:  400 QTC Calculation: 434 R Axis:   27 Text Interpretation: Sinus rhythm with 1st degree A-V block Low voltage QRS Cannot rule out Anterior infarct , age undetermined Abnormal ECG When compared with ECG of 09-Feb-2022 07:45, PREVIOUS ECG IS PRESENT Confirmed by Pryor Curia [phone]) on 02/13/2022 6:12:52 AM         RADIOLOGY: My personal review and interpretation of imaging: Chest x-ray shows possible interstitial edema.  I have personally reviewed all radiology reports.   DG Chest Port 1 View  Result Date: 02/13/2022 CLINICAL DATA:  Shortness of breath. EXAM: PORTABLE CHEST 1 VIEW COMPARISON:  01/29/2018 FINDINGS: 0607 hours. Low volume film. The cardio pericardial silhouette is enlarged. There is pulmonary vascular congestion without overt pulmonary edema. Diffuse interstitial opacity suggests edema. No substantial pleural effusion. Telemetry leads overlie the chest. IMPRESSION: Low volume film with vascular congestion and interstitial edema. Electronically Signed   By: Misty Stanley M.D.   On: 02/13/2022 06:19     PROCEDURES:  Critical Care performed: Yes, see critical care procedure note(s)   CRITICAL CARE Performed by: Cyril Mourning Brighten Orndoff   Total critical care time: 40  minutes  Critical care time was exclusive of separately billable procedures and treating other patients.  Critical care was necessary to treat or prevent imminent or life-threatening deterioration.  Critical care was time spent personally by me on the following activities: development of treatment plan with patient and/or surrogate as well as nursing, discussions with consultants, evaluation of patient's response to treatment, examination of patient, obtaining history from patient or surrogate, ordering and performing treatments and interventions, ordering and review of laboratory studies, ordering and review of radiographic studies, pulse oximetry and re-evaluation of patient's condition.   Marland Kitchen1-3 Lead EKG Interpretation  Performed by: Seydina Holliman, Delice Bison, DO Authorized by: Audra Bellard, Delice Bison, DO     Interpretation: normal     ECG rate:  73   ECG rate assessment: normal     Rhythm: sinus rhythm     Ectopy: none     Conduction: normal       IMPRESSION / MDM / ASSESSMENT AND PLAN / ED COURSE  I reviewed the triage vital signs and the nursing notes.    Patient here with shortness of breath, wheezing, cough, hypoxia.  The patient is on the cardiac monitor to evaluate for evidence of arrhythmia and/or significant heart rate changes.   DIFFERENTIAL DIAGNOSIS (includes but not limited to):   Emphysema, COPD exacerbation, pneumonia, CHF, PE, viral URI   Patient's presentation is most consistent with acute presentation with potential threat to life or bodily function.   PLAN: We will obtain CBC, BMP, troponin, chest x-ray.  Will give breathing treatments, IV steroids.  Anticipate admission given new oxygen requirement and increased work of breathing.   MEDICATIONS GIVEN IN ED: Medications  albuterol (PROVENTIL) (2.5 MG/3ML) 0.083% nebulizer solution 5 mg (has no administration in time range)  ipratropium (ATROVENT) nebulizer solution 0.5 mg (has no administration in time range)   potassium chloride SA (KLOR-CON M) CR  tablet 40 mEq (has no administration in time range)  albuterol (PROVENTIL) (2.5 MG/3ML) 0.083% nebulizer solution 5 mg (5 mg Nebulization Given 02/13/22 0623)  ipratropium (ATROVENT) nebulizer solution 0.5 mg (0.5 mg Nebulization Given 02/13/22 0623)  methylPREDNISolone sodium succinate (SOLU-MEDROL) 125 mg/2 mL injection 125 mg (125 mg Intravenous Given 02/13/22 8889)     ED COURSE: Patient's labs show leukocytosis of 12,000.  Normal hemoglobin.  Normal electrolytes other than potassium of 3.2.  Will give replacement.  Troponin negative.  Chest x-ray reviewed and interpreted by myself and the radiologist and shows possible pulmonary edema.  We will add on BNP given concerns for CHF with this finding on chest x-ray.  D-dimer was negative.  We will continue breathing treatments.   Patient's BNP only minimally elevated.  Recent echocardiogram in June showed no CHF.  She has no JVD or peripheral edema on exam.  We will hold Lasix at this time.  COVID, flu negative.  We will continue breathing treatments but anticipate admission if patient continues to be short of breath, dyspneic at rest and hypoxic.  CONSULTS: Likely admission to the hospital for new oxygen requirement.   OUTSIDE RECORDS REVIEWED: Reviewed patient's last cardiology note on 02/01/2022.   Echo 08/30/21:  IMPRESSIONS     1. Left ventricular ejection fraction, by estimation, is 55 to 60%. The  left ventricle has normal function. The left ventricle has no regional  wall motion abnormalities. Left ventricular diastolic parameters are  indeterminate.   2. Right ventricular systolic function is normal. The right ventricular  size is normal. Tricuspid regurgitation signal is inadequate for assessing  PA pressure.   3. Left atrial size was moderately dilated.   4. The mitral valve is normal in structure. Mild mitral valve  regurgitation. No evidence of mitral stenosis.   5. The aortic valve  is normal in structure. Aortic valve regurgitation is  not visualized. No aortic stenosis is present.   6. The inferior vena cava is normal in size with greater than 50%  respiratory variability, suggesting right atrial pressure of 3 mmHg.     FINAL CLINICAL IMPRESSION(S) / ED DIAGNOSES   Final diagnoses:  Pulmonary emphysema, unspecified emphysema type ([place])  Acute respiratory failure with hypoxia ([place])     Rx / DC Orders   ED Discharge Orders     None        Note:  This document was prepared using Dragon voice recognition software and may include unintentional dictation errors.   Delorise Hunkele, Delice Bison, DO 02/13/22 [phone]

## 2022-02-13 NOTE — H&P (Addendum)
History and Physical    [name] HWT:[id number] DOB: 1950/06/19 DOA: 02/13/2022  Referring MD/NP/PA:   PCP: Abner Greenspan, MD   Patient coming from:  The patient is coming from home.  At baseline, pt is independent for most of ADL.        Chief Complaint: SOB  HPI: Anita Harrell is a 71 y.o. female with medical history significant of hypertension, hyperlipidemia, COPD not on oxygen at home, depression with anxiety, panic disorder, tobacco abuse, atrial fibrillation on Eliquis, possible undiagnosed CHF (patient is on Lasix), who presents with shortness breath.  Patient states that she has shortness of breath for more than 3 days, which has been progressively worsening.  Patient has dry cough, wheezing, no chest pain, fever or chills.  Patient denies chest pain.  No nausea, vomiting, diarrhea or abdominal pain.  No symptoms of UTI. Patient states that she tripped her steps and fell on Saturday night.  No loss of consciousness.  No significant injury. Patient strongly denies any head or neck injury.  She refused CT scan of head and neck.  She has bruise over left forearm, but no left arm pain.  Patient has a bilateral lower leg edema.  Patient is using oxygen normally, but was found to have oxygen desaturation 85% on room air, which improved to 98% on 2 L oxygen.  Patient has difficulty speaking in full sentence.  Patient is in no acute respiratory distress.  Patient is very anxious.  Data reviewed independently and ED Course: pt was found to have WBC 12.6, negative COVID PCR, negative D-dimer, potassium 3.2, GFR> 60, troponin level 4 --> 5, temperature 97.5, blood pressure 131/61, heart rate 81, RR 30, chest x-ray showed low volume and vascular congestion with interstitial edema.  Patient is admitted to telemetry bed as inpatient.   EKG: I have personally reviewed.  Sinus rhythm, QTc 434, low voltage.   Review of Systems:   General: no fevers, chills, no body weight gain, has  fatigue HEENT: no blurry vision, hearing changes or sore throat Respiratory: has dyspnea, coughing, wheezing CV: no chest pain, no palpitations GI: no nausea, vomiting, abdominal pain, diarrhea, constipation is a patient GU: no dysuria, burning on urination, increased urinary frequency, hematuria  Ext: 1+ leg edema Neuro: no unilateral weakness, numbness, or tingling, no vision change or hearing loss. Has fall Skin: no rash, no skin tear. MSK: No muscle spasm, no deformity, no limitation of range of movement in spin Heme: No easy bruising.  Travel history: No recent long distant travel.   Allergy:  Allergies  Allergen Reactions   Abilify [Aripiprazole]     Vision problem   Codeine Nausea And Vomiting    REACTION: Nausea and vomiting Pt has tolerated vicodin & percocet in the past   Cymbalta [Duloxetine Hcl]     More depressed/tearful    Lipitor [Atorvastatin]     Muscle pain    Wellbutrin [Bupropion]     Past Medical History:  Diagnosis Date   Allergy    allergic rhinitis   Anxiety    Back pain    Chest pain    Depression    Epigastric pain    Goiter    History of tobacco abuse    Hyperglycemia    mild   Hyperlipidemia    Hypertension    Insomnia    Hx of   Labile blood pressure    Panic disorder    History of   Personal history of  colonic polyps 06/09/2004   SVD (spontaneous vaginal delivery)    x 2    Past Surgical History:  Procedure Laterality Date   ABDOMINAL HYSTERECTOMY N/A 09/24/2012   Procedure: HYSTERECTOMY ABDOMINAL;  Surgeon: Gus Height, MD;  Location: [facility];  Service: Gynecology;  Laterality: N/A;   BREAST BIOPSY     CARDIOVERSION N/A 02/09/2022   Procedure: CARDIOVERSION;  Surgeon: Minna Merritts, MD;  Location: [facility];  Service: Cardiovascular;  Laterality: N/A;   COLONOSCOPY     COLONOSCOPY  2017   ELBOW SURGERY  2004   EYE SURGERY     bilateral lasik   SALPINGOOPHORECTOMY Bilateral 09/24/2012   Procedure: SALPINGO OOPHORECTOMY;   Surgeon: Gus Height, MD;  Location: [facility];  Service: Gynecology;  Laterality: Bilateral;   THYROID SURGERY     B9 massess   WART FULGURATION N/A 09/24/2012   Procedure: FULGURATION VAGINAL WART;  Surgeon: Gus Height, MD;  Location: [facility];  Service: Gynecology;  Laterality: N/A;   WISDOM TOOTH EXTRACTION      Social History:  reports that she quit smoking about 13 years ago. Her smoking use included cigarettes and e-cigarettes. She has a 20.00 pack-year smoking history. She has never used smokeless tobacco. She reports that she does not drink alcohol and does not use drugs.  Family History:  Family History  Problem Relation Age of Onset   Hypertension Mother    Stroke Mother    Alcohol abuse Father    Cancer Father        lung CA   Heart disease Father        CHF   Cancer Sister        breast CA   Heart disease Sister        CHF from chemotx also has defib   Breast cancer Sister    Colon cancer Neg Hx    Esophageal cancer Neg Hx    Rectal cancer Neg Hx    Stomach cancer Neg Hx      Prior to Admission medications   Medication Sig Start Date End Date Taking? Authorizing Provider  acetaminophen (TYLENOL) 650 MG CR tablet Take 650 mg by mouth every 8 (eight) hours as needed for pain.   Yes [provider]  albuterol (VENTOLIN HFA) 108 (90 Base) MCG/ACT inhaler Inhale 2 puffs into the lungs every 6 (six) hours as needed for wheezing or shortness of breath. 11/29/21  Yes Tyler Pita, MD  apixaban (ELIQUIS) 5 MG TABS tablet TAKE 1 TABLET BY MOUTH TWICE A DAY 01/10/22  Yes Gollan, Kathlene November, MD  busPIRone (BUSPAR) 30 MG tablet TAKE ONE HALF TO 1 TABLET BY MOUTH 3 TIMES DAILY AS DIRECTED 11/04/21  Yes Tower, Wynelle Fanny, MD  cyclobenzaprine (FLEXERIL) 10 MG tablet Take 0.5-1 tablets (5-10 mg total) by mouth at bedtime as needed for muscle spasms. 11/30/21  Yes Copland, Frederico Hamman, MD  flecainide (TAMBOCOR) 100 MG tablet Take 1 tablet (100 mg total) by mouth 2 (two) times daily.  12/12/21  Yes Gollan, Kathlene November, MD  furosemide (LASIX) 20 MG tablet Take 1 tablet (20 mg total) by mouth daily. 02/01/22 05/02/22 Yes Gollan, Kathlene November, MD  metoprolol succinate (TOPROL-XL) 50 MG 24 hr tablet TAKE 1 TABLET BY MOUTH TWICE A DAY WITH MEALS 11/04/21  Yes Gollan, Kathlene November, MD  potassium chloride (KLOR-CON) 10 MEQ tablet Take 1 tablet (10 mEq total) by mouth daily. 02/01/22 05/02/22 Yes Minna Merritts, MD  rosuvastatin (CRESTOR) 10 MG  tablet TAKE 1 TABLET BY MOUTH ONCE A DAY 12/30/21  Yes Tower, Marne A, MD  solifenacin (VESICARE) 10 MG tablet TAKE 1 TABLET BY MOUTH ONCE A DAY 12/08/21  Yes Tower, Wynelle Fanny, MD  traMADol (ULTRAM) 50 MG tablet Take 1-2 tablets (50-100 mg total) by mouth every 8 (eight) hours as needed for moderate pain. 01/26/22  Yes Copland, Frederico Hamman, MD  zolpidem (AMBIEN CR) 12.5 MG CR tablet TAKE 1 TABLET BY MOUTH ONCE DAILY AT BEDTIME AS NEEDED FOR SLEEP 12/19/21  Yes Tower, Wynelle Fanny, MD  famotidine (PEPCID) 20 MG tablet Take 1 tablet (20 mg total) by mouth at bedtime. Patient not taking: Reported on 02/13/2022 10/26/21   Tower, Wynelle Fanny, MD  sertraline (ZOLOFT) 25 MG tablet Take 25 mg by mouth daily. Patient not taking: Reported on 02/09/2022 01/17/22   [provider]    Physical Exam: Vitals:   02/13/22 0800 02/13/22 0828 02/13/22 0900 02/13/22 1229  BP: 127/70 120/70 131/61 130/60  Pulse: 80 70 81 78  Resp: 12 (!) 24  20  Temp: (!) 97.5 F (36.4 C) (!) 97.5 F (36.4 C)    TempSrc: Oral Oral  Oral  SpO2: 95% 98%  98%  Weight:      Height:       General: in acute respiratory distress HEENT:       Eyes: PERRL, EOMI, no scleral icterus.       ENT: No discharge from the ears and nose, no pharynx injection, no tonsillar enlargement.        Neck: Positive JVD, no bruit, no mass felt. Heme: No neck lymph node enlargement. Cardiac: S1/S2, RRR, No murmurs, No gallops or rubs. Respiratory: Has wheezing bilaterally GI: Soft, nondistended, nontender, no  rebound pain, no organomegaly, BS present. GU: No hematuria Ext: 1+ pitting leg edema bilaterally. 1+DP/PT pulse bilaterally. Musculoskeletal: No joint deformities, No joint redness or warmth, no limitation of ROM in spin. Skin: No rashes. Has bruise in left forearm Neuro: Alert, oriented X3, cranial nerves II-XII grossly intact, moves all extremities normally. Psych: Patient is not psychotic, no suicidal or hemocidal ideation.  Labs on Admission: I have personally reviewed following labs and imaging studies  CBC: Recent Labs  Lab 02/13/22 0547  WBC 12.6*  HGB 12.2  HCT 36.7  MCV 90.0  PLT 170*   Basic Metabolic Panel: Recent Labs  Lab 02/13/22 0547 02/13/22 0838  NA 139  --   K 3.2*  --   CL 107  --   CO2 26  --   GLUCOSE 119*  --   BUN 13  --   CREATININE 0.61  --   CALCIUM 8.8*  --   MG  --  1.9   GFR: Estimated Creatinine Clearance: 66.5 mL/min (by C-G formula based on SCr of 0.61 mg/dL). Liver Function Tests: No results for input(s): "AST", "ALT", "ALKPHOS", "BILITOT", "PROT", "ALBUMIN" in the last 168 hours. No results for input(s): "LIPASE", "AMYLASE" in the last 168 hours. No results for input(s): "AMMONIA" in the last 168 hours. Coagulation Profile: No results for input(s): "INR", "PROTIME" in the last 168 hours. Cardiac Enzymes: No results for input(s): "CKTOTAL", "CKMB", "CKMBINDEX", "TROPONINI" in the last 168 hours. BNP (last 3 results) No results for input(s): "PROBNP" in the last 8760 hours. HbA1C: No results for input(s): "HGBA1C" in the last 72 hours. CBG: No results for input(s): "GLUCAP" in the last 168 hours. Lipid Profile: No results for input(s): "CHOL", "HDL", "LDLCALC", "TRIG", "CHOLHDL", "LDLDIRECT" in  the last 72 hours. Thyroid Function Tests: No results for input(s): "TSH", "T4TOTAL", "FREET4", "T3FREE", "THYROIDAB" in the last 72 hours. Anemia Panel: No results for input(s): "VITAMINB12", "FOLATE", "FERRITIN", "TIBC", "IRON",  "RETICCTPCT" in the last 72 hours. Urine analysis:    Component Value Date/Time   BILIRUBINUR Negative 06/25/2018 1508   PROTEINUR Positive (A) 06/25/2018 1508   UROBILINOGEN 0.2 06/25/2018 1508   NITRITE Negative 06/25/2018 1508   LEUKOCYTESUR Large (3+) (A) 06/25/2018 1508   Sepsis Labs: '@LABRCNTIP'$ (procalcitonin:4,lacticidven:4) ) Recent Results (from the past 240 hour(s))  Resp Panel by RT-PCR (Flu A&B, Covid) Anterior Nasal Swab     Status: None   Collection Time: 02/13/22  5:57 AM   Specimen: Anterior Nasal Swab  Result Value Ref Range Status   SARS Coronavirus 2 by RT PCR NEGATIVE NEGATIVE Final    Comment: (NOTE) SARS-CoV-2 target nucleic acids are NOT DETECTED.  The SARS-CoV-2 RNA is generally detectable in upper respiratory specimens during the acute phase of infection. The lowest concentration of SARS-CoV-2 viral copies this assay can detect is 138 copies/mL. A negative result does not preclude SARS-Cov-2 infection and should not be used as the sole basis for treatment or other patient management decisions. A negative result may occur with  improper specimen collection/handling, submission of specimen other than nasopharyngeal swab, presence of viral mutation(s) within the areas targeted by this assay, and inadequate number of viral copies(<138 copies/mL). A negative result must be combined with clinical observations, patient history, and epidemiological information. The expected result is Negative.  Fact Sheet for Patients:  EntrepreneurPulse.com.au  Fact Sheet for Healthcare Providers:  IncredibleEmployment.be  This test is no t yet approved or cleared by the [place] FDA and  has been authorized for detection and/or diagnosis of SARS-CoV-2 by FDA under an Emergency Use Authorization (EUA). This EUA will remain  in effect (meaning this test can be used) for the duration of the COVID-19 declaration under Section 564(b)(1)  of the Act, 21 U.S.C.section 360bbb-3(b)(1), unless the authorization is terminated  or revoked sooner.       Influenza A by PCR NEGATIVE NEGATIVE Final   Influenza B by PCR NEGATIVE NEGATIVE Final    Comment: (NOTE) The Xpert Xpress SARS-CoV-2/FLU/RSV plus assay is intended as an aid in the diagnosis of influenza from Nasopharyngeal swab specimens and should not be used as a sole basis for treatment. Nasal washings and aspirates are unacceptable for Xpert Xpress SARS-CoV-2/FLU/RSV testing.  Fact Sheet for Patients: EntrepreneurPulse.com.au  Fact Sheet for Healthcare Providers: IncredibleEmployment.be  This test is not yet approved or cleared by the [place] FDA and has been authorized for detection and/or diagnosis of SARS-CoV-2 by FDA under an Emergency Use Authorization (EUA). This EUA will remain in effect (meaning this test can be used) for the duration of the COVID-19 declaration under Section 564(b)(1) of the Act, 21 U.S.C. section 360bbb-3(b)(1), unless the authorization is terminated or revoked.  Performed at [facility], [address] [place], [place] [address]      Radiological Exams on Admission: DG Chest [facility] 1 View  Result Date: 02/13/2022 CLINICAL DATA:  Shortness of breath. EXAM: PORTABLE CHEST 1 VIEW COMPARISON:  01/29/2018 FINDINGS: 0607 hours. Low volume film. The cardio pericardial silhouette is enlarged. There is pulmonary vascular congestion without overt pulmonary edema. Diffuse interstitial opacity suggests edema. No substantial pleural effusion. Telemetry leads overlie the chest. IMPRESSION: Low volume film with vascular congestion and interstitial edema. Electronically Signed   By: Verda Cumins.D.  On: 02/13/2022 06:19      Assessment/Plan Principal Problem:   Acute respiratory failure with hypoxia (HCC) Active Problems:   COPD exacerbation (HCC)   Severe sepsis (HCC)   Acute on chronic  diastolic CHF (congestive heart failure) (HCC)   HTN (hypertension)   Atrial fibrillation with RVR ([place])   Fall at home, initial encounter   Hyperlipidemia   Hypokalemia   Depression with anxiety   Obesity (BMI 30-39.9)   Assessment and Plan:   Acute respiratory failure with hypoxia due and severe sepsis to COPD exacerbation [facility]): Patient meets criteria for severe sepsis with WBC 12.6, RR 30.  Lactic acid of 4.0.  -will admit to tele bed as inpatient --> change to PCU -Bronchodilators -Solu-Medrol 40 mg IV bid -Z pak  -Mucinex for cough  -Incentive spirometry -sputum culture -Nasal cannula oxygen as needed to maintain O2 saturation 93% or greater -will get Procalcitonin and trend lactic acid levels per sepsis protocol. -IVF: 500 of NS bolus in ED, followed by 75 cc/h  (patient has possible undiagnosed diastolic congestive heart failure exacerbation, limiting aggressive IV fluids treatment).  Possible acute on chronic diastolic CHF (congestive heart failure) [facility]): Patient has possible undiagnosed CHF.  2D echo on 08/30/2021 showed EF of 55-60% with indeterminate diastolic dysfunction.  Patient was started on Lasix by cardiologist.  Patient has 1+ leg edema, positive JVD, elevated BNP 128, vascular congestion and interstitial edema on chest x-ray, indicating possible CHF with acute exacerbation. -Patient received 40 mg of Lasix in ED, will not continue Lasix since patient has severe sepsis -will repeat 2d echo now  HTN (hypertension) -IV hydralazine as needed -Metoprolol  Atrial fibrillation, persistent ([place]): Heart rate 81 -Continue Eliquis and metoprolol, flecainide  Addendum: initially HR 80s, her HR goes up to 130s later on -will start cardizem gtt.  -chang bed to PCU  Fall at home, initial encounter: No acute injury.  Patient is strongly denies head or neck injury.  She refused CT scan of the head and neck. -For  precaution -PT/OT  Hyperlipidemia -[place]  Hypokalemia: Potassium 3.2.  Magnesium 1.9 -Repleted potassium  Depression with anxiety -Continue home medications  Obesity (BMI 30-39.9): BMI 36.58, body weight 90.7 kg -Exercise and healthy diet -Encourage losing weight    DVT ppx: on Eliquis  Code Status: Full code  Family Communication:   Yes, patient's husband   at bed side.   Disposition Plan:  Anticipate discharge back to previous environment  Consults called:  none  Admission status and Level of care: Telemetry Medical:    as inpt      Dispo: The patient is from: Home              Anticipated d/c is to: Home              Anticipated d/c date is: 2 days              Patient currently is not medically stable to d/c.    Severity of Illness:  The appropriate patient status for this patient is INPATIENT. Inpatient status is judged to be reasonable and necessary in order to provide the required intensity of service to ensure the patient's safety. The patient's presenting symptoms, physical exam findings, and initial radiographic and laboratory data in the context of their chronic comorbidities is felt to place them at high risk for further clinical deterioration. Furthermore, it is not anticipated that the patient will be medically stable for discharge from the hospital within 2  midnights of admission.   * I certify that at the point of admission it is my clinical judgment that the patient will require inpatient hospital care spanning beyond 2 midnights from the point of admission due to high intensity of service, high risk for further deterioration and high frequency of surveillance required.*       Date of Service 02/13/2022    Ivor Costa [facility]   If 7PM-7AM, please contact night-coverage [email or web address] 02/13/2022, 5:06 PM

## 2022-02-13 NOTE — Evaluation (Signed)
Occupational Therapy Evaluation Patient Details Name: Anita Harrell MRN: [id number] DOB: 05/15/1950 Today's Date: 02/13/2022   History of Present Illness 71 y.o. female with medical history significant of hypertension, hyperlipidemia, COPD not on oxygen at home, depression with anxiety, panic disorder, tobacco abuse, atrial fibrillation on Eliquis, possible undiagnosed CHF (patient is on Lasix), who presents with shortness breath.     Patient states that she has shortness of breath for more than 3 days, which has been progressively worsening.  Patient has dry cough, wheezing, no chest pain, fever or chills.  Patient denies chest pain.  No nausea, vomiting, diarrhea or abdominal pain.  No symptoms of UTI. Patient states that she tripped her steps and fell on Saturday night.  No loss of consciousness.  No significant injury. Patient strongly denies any head or neck injury.  She refused CT scan of head and neck.  She has bruise over left forearm, but no left arm pain.  Patient has a bilateral lower leg edema.   Clinical Impression   Patient presenting with decreased Ind in self care, balance, functional mobility/transfers, endurance, and safety awareness. Patient reports living at home with spouse and being independent in self care and mobility tasks. Pt endorses fall ~ 2 days ago walking on the steps that lead to her bedroom. Pt initially on 4Ls O2 via [place] and able to progress to RA. Pt performs toileting with close supervision and dons B socks with set up A to obtain needed items. Pt ambulates 44' with close supervision on RA. When returning to room O2 saturation at 88% and placed on 1 L  O2 via [place]. RN notified. OT recommends pt utilized RW at home for safety and energy conservation. Patient would benefit from energy conservation education. Patient will benefit from acute OT to increase overall independence in the areas of ADLs, functional mobility, and safety awareness in order to safely discharge and  safety awareness.      Recommendations for follow up therapy are one component of a multi-disciplinary discharge planning process, led by the attending physician.  Recommendations may be updated based on patient status, additional functional criteria and insurance authorization.   Follow Up Recommendations  Outpatient OT     Assistance Recommended at Discharge Intermittent Supervision/Assistance  Patient can return home with the following A little help with walking and/or transfers;A little help with bathing/dressing/bathroom;Help with stairs or ramp for entrance;Assistance with cooking/housework;Assist for transportation    Functional Status Assessment  Patient has had a recent decline in their functional status and demonstrates the ability to make significant improvements in function in a reasonable and predictable amount of time.  Equipment Recommendations  Other (comment) (recommended use of RW at home)       Precautions / Restrictions Precautions Precautions: Fall      Mobility Bed Mobility Overal bed mobility: Modified Independent                  Transfers Overall transfer level: Needs assistance Equipment used: None Transfers: Sit to/from Stand, Bed to chair/wheelchair/BSC Sit to Stand: Supervision     Step pivot transfers: Supervision            Balance Overall balance assessment: Needs assistance Sitting-balance support: Feet supported Sitting balance-Leahy Scale: Normal     Standing balance support: During functional activity Standing balance-Leahy Scale: Good  ADL either performed or assessed with clinical judgement   ADL Overall ADL's : Needs assistance/impaired                     Lower Body Dressing: Supervision/safety;Set up;Sit to/from stand   Toilet Transfer: Supervision/safety;BSC/3in1;Ambulation   Toileting- Clothing Manipulation and Hygiene: Supervision/safety;Sit to/from stand        Functional mobility during ADLs: Supervision/safety       Vision Patient Visual Report: No change from baseline              Pertinent Vitals/Pain Pain Assessment Pain Assessment: No/denies pain     Hand Dominance Right   Extremity/Trunk Assessment Upper Extremity Assessment Upper Extremity Assessment: Generalized weakness;Overall [facility] for tasks assessed   Lower Extremity Assessment Lower Extremity Assessment: Generalized weakness;Overall WFL for tasks assessed       Communication Communication Communication: No difficulties   Cognition Arousal/Alertness: Awake/alert Behavior During Therapy: WFL for tasks assessed/performed Overall Cognitive Status: Within Functional Limits for tasks assessed                                                  Home Living Family/patient expects to be discharged to:: Private residence Living Arrangements: Spouse/significant other Available Help at Discharge: Family;Available 24 hours/day Type of Home: House Home Access: Level entry     Home Layout: One level;Other (Comment) (3 steps to bedroom)     Bathroom Shower/Tub: Teacher, early years/pre: Standard     Home Equipment: Conservation officer, nature (2 wheels);Cane - single point;Grab bars - tub/shower;Grab bars - toilet          Prior Functioning/Environment Prior Level of Function : Independent/Modified Independent;History of Falls (last six months)             Mobility Comments: Ind without AD. 1 fall ~ 2 days ago ADLs Comments: Husband is "around" and helps her get out of the tub and drives. She does all other ADL tasks independently.        OT Problem List: Decreased strength;Decreased activity tolerance;Impaired balance (sitting and/or standing);Decreased safety awareness;Decreased knowledge of use of DME or AE      OT Treatment/Interventions: Self-care/ADL training;Therapeutic exercise;Therapeutic activities;DME and/or AE  instruction;Patient/family education;Balance training    OT Goals(Current goals can be found in the care plan section) Acute Rehab OT Goals Patient Stated Goal: to return home OT Goal Formulation: With patient/family Time For Goal Achievement: 02/27/22 Potential to Achieve Goals: Good ADL Goals Pt Will Perform Grooming: with modified independence;standing Pt Will Perform Lower Body Dressing: with modified independence;sit to/from stand Pt Will Transfer to Toilet: with modified independence;ambulating Pt Will Perform Toileting - Clothing Manipulation and hygiene: with modified independence;sit to/from stand  OT Frequency: Min 2X/week       AM-PAC OT "6 Clicks" Daily Activity     Outcome Measure Help from another person eating meals?: None Help from another person taking care of personal grooming?: None Help from another person toileting, which includes using toliet, bedpan, or urinal?: A Little Help from another person bathing (including washing, rinsing, drying)?: A Little Help from another person to put on and taking off regular upper body clothing?: None Help from another person to put on and taking off regular lower body clothing?: A Little 6 Click Score: 21   End of Session Nurse Communication: Mobility status  Activity Tolerance: Patient tolerated treatment well Patient left: in bed;with call bell/phone within reach;with family/visitor present  OT Visit Diagnosis: Unsteadiness on feet (R26.81);Muscle weakness (generalized) (M62.81);History of falling (Z91.81)                Time: 5075-7322 OT Time Calculation (min): 20 min Charges:  OT General Charges $OT Visit: 1 Visit OT Evaluation $OT Eval Low Complexity: 1 Low OT Treatments $Self Care/Home Management : 8-22 mins  Darleen Crocker, MS, OTR/L , CBIS ascom [phone]  02/13/22, 3:20 PM

## 2022-02-14 ENCOUNTER — Ambulatory Visit: Payer: Medicare Other | Admitting: Physical Therapy

## 2022-02-14 ENCOUNTER — Inpatient Hospital Stay (HOSPITAL_COMMUNITY)
Admit: 2022-02-14 | Discharge: 2022-02-14 | Disposition: A | Discharge status: Home / Self Care | Payer: Medicare Other | Attending: Internal Medicine | Admitting: Internal Medicine

## 2022-02-14 ENCOUNTER — Encounter: Payer: Medicare Other | Admitting: Physical Therapy

## 2022-02-14 DIAGNOSIS — I5031 Acute diastolic (congestive) heart failure: Secondary | ICD-10-CM | POA: Diagnosis not present

## 2022-02-14 DIAGNOSIS — J9601 Acute respiratory failure with hypoxia: Secondary | ICD-10-CM | POA: Diagnosis not present

## 2022-02-14 LAB — BASIC METABOLIC PANEL
Anion gap: 8 (ref 5–15)
BUN: 8 mg/dL (ref 8–23)
CO2: 26 mmol/L (ref 22–32)
Calcium: 9.3 mg/dL (ref 8.9–10.3)
Chloride: 106 mmol/L (ref 98–111)
Creatinine, Ser: 0.55 mg/dL (ref 0.44–1.00)
GFR, Estimated: >60 mL/min (ref >60)
Glucose, Bld: 153 mg/dL — ABNORMAL HIGH (ref 70–99)
Potassium: 3.3 mmol/L — ABNORMAL LOW (ref 3.5–5.1)
Sodium: 140 mmol/L (ref 135–145)

## 2022-02-14 LAB — ECHOCARDIOGRAM COMPLETE
AR max vel: 2.27 cm2
AV Area VTI: 2.67 cm2
AV Area mean vel: 2.45 cm2
AV Mean grad: 4 mmHg
AV Peak grad: 8.6 mmHg
Ao pk vel: 1.47 m/s
Area-P 1/2: 3.87 cm2
Height: 62 in
S' Lateral: 3.3 cm
Weight: 3304 oz

## 2022-02-14 LAB — LACTIC ACID, PLASMA: Lactic Acid, Venous: 1.6 mmol/L (ref 0.5–1.9)

## 2022-02-14 LAB — MAGNESIUM: Magnesium: 1.9 mg/dL (ref 1.7–2.4)

## 2022-02-14 MED ORDER — PERFLUTREN LIPID MICROSPHERE
1.0000 mL | INTRAVENOUS | Status: AC | PRN
  Administered 2022-02-14: 2 mL via INTRAVENOUS

## 2022-02-14 MED ORDER — IPRATROPIUM-ALBUTEROL 0.5-2.5 (3) MG/3ML IN SOLN
3.0000 mL | Freq: Four times a day (QID) | RESPIRATORY_TRACT | Status: DC
  Administered 2022-02-14: 3 mL via RESPIRATORY_TRACT
  Filled 2022-02-14: qty 3

## 2022-02-14 MED ORDER — IPRATROPIUM-ALBUTEROL 0.5-2.5 (3) MG/3ML IN SOLN
3.0000 mL | Freq: Three times a day (TID) | RESPIRATORY_TRACT | Status: DC
  Administered 2022-02-14 – 2022-02-15 (×3): 3 mL via RESPIRATORY_TRACT
  Filled 2022-02-14 (×3): qty 3

## 2022-02-14 MED ORDER — MAGNESIUM SULFATE 2 GM/50ML IV SOLN
2.0000 g | Freq: Once | INTRAVENOUS | Status: AC
  Administered 2022-02-14: 2 g via INTRAVENOUS
  Filled 2022-02-14: qty 50

## 2022-02-14 MED ORDER — BUDESONIDE 0.25 MG/2ML IN SUSP
0.2500 mg | Freq: Two times a day (BID) | RESPIRATORY_TRACT | Status: DC
  Administered 2022-02-14 – 2022-02-15 (×3): 0.25 mg via RESPIRATORY_TRACT
  Filled 2022-02-14 (×3): qty 2

## 2022-02-14 MED ORDER — ARFORMOTEROL TARTRATE 15 MCG/2ML IN NEBU
15.0000 ug | INHALATION_SOLUTION | Freq: Two times a day (BID) | RESPIRATORY_TRACT | Status: DC
  Administered 2022-02-14 – 2022-02-15 (×3): 15 ug via RESPIRATORY_TRACT
  Filled 2022-02-14 (×3): qty 2

## 2022-02-14 MED ORDER — FUROSEMIDE 10 MG/ML IJ SOLN
40.0000 mg | Freq: Once | INTRAMUSCULAR | Status: AC
  Administered 2022-02-14: 40 mg via INTRAVENOUS
  Filled 2022-02-14: qty 4

## 2022-02-14 NOTE — Progress Notes (Signed)
*  PRELIMINARY RESULTS* Echocardiogram 2D Echocardiogram has been performed.  Anita Harrell 02/14/2022, 1:12 PM

## 2022-02-14 NOTE — Progress Notes (Signed)
Patient admitted to the unit and a skin assessment was completed by myself and Wellsite geologist. The patient has a large bruise on her left forearm, petechia on both breast and back, and a foam dressing was applied to her bottom due to some scabs on her bottom from a fall prior to admission. No other skin issues are noted at this time.

## 2022-02-14 NOTE — Progress Notes (Signed)
Per Dr. Vic Blackbird, okay to stop cardizem drip, po scheduled medications given. Patient remains on telemetry monitor. Will continue to monitor closely

## 2022-02-14 NOTE — Consult Note (Addendum)
   Heart Failure Nurse Navigator Note  HFpEF 55 to 60%.  Echocardiogram to be performed on this admission is pending.  She presented to the emergency room from home with complaints of worsening shortness of breath for more than 3 days.  Dry cough and wheezing was noted also.  BNP 828.  Chest x-ray revealed interstitial edema.  Comorbidities:  Hypertension Hyperlipidemia COPD Depression/anxiety Tobacco abuse Atrial fibrillation on Eliquis  Medications:  Apixaban 5 mg 2 times a day Flecainide 100 mg 2 times a day Metoprolol succinate 50 mg 2 times a day Potassium chloride 40 mill equivalents daily Rosuvastatin 10 mg daily  Labs:  Sodium 140, potassium 3.3, chloride 106, CO2 26, BUN 8, creatinine 0.55, estimated GFR greater than 60, magnesium 1.9. Weight is 93.7 kg Intake 1039 mL Output not documented  Meeting with patient on this admission.  She was sitting on the edge of the bed and no acute distress.  She remains in atrial fibrillation per the monitor.  She states that she lives at home with her husband.  She does not weigh herself on a daily basis.  Explained the rationale behind weighing daily and reporting 2 pound weight gain overnight or 5 pounds within the week.  She states at home that she uses salt substitute and does not use salt at the table.  Discussed low-sodium diet and avoiding processed foods.  She voices understanding.  Also went over fluid restriction of no more than 64 ounces in a days time and what constitutes a liquid.  Made aware of follow-up in the outpatient heart failure clinic for which she has an appointment on December 15 at 10 AM.  She has a 1% no-show.  She also was made aware that she qualifies for the ventricle health program with her insurance.  Explained the program and she was given a flyer.  She states that she would like to discuss this with her husband before making a decision.  He was given the living with heart failure teaching  booklet, zone magnet, info on heart failure and low-sodium along with weight chart.  Pricilla Riffle RN [facility]

## 2022-02-14 NOTE — Discharge Instructions (Signed)

## 2022-02-14 NOTE — Progress Notes (Signed)
PROGRESS NOTE    Anita Harrell  UDJ:[id number] DOB: [birth year] DOA: 02/13/2022 PCP: Abner Greenspan, MD    Brief Narrative:  71 y.o. female with medical history significant of hypertension, hyperlipidemia, COPD not on oxygen at home, depression with anxiety, panic disorder, tobacco abuse, atrial fibrillation on Eliquis, possible undiagnosed CHF (patient is on Lasix), who presents with shortness breath.   Patient states that she has shortness of breath for more than 3 days, which has been progressively worsening.  Patient has dry cough, wheezing, no chest pain, fever or chills.  Patient denies chest pain.  No nausea, vomiting, diarrhea or abdominal pain.  No symptoms of UTI. Patient states that she tripped her steps and fell on Saturday night.  No loss of consciousness.  No significant injury. Patient strongly denies any head or neck injury.  She refused CT scan of head and neck.  She has bruise over left forearm, but no left arm pain.  Patient has a bilateral lower leg edema.   Patient is using oxygen normally, but was found to have oxygen desaturation 85% on room air, which improved to 98% on 2 L oxygen.  Patient has difficulty speaking in full sentence.  Patient is in no acute respiratory distress.  Patient is very anxious.  12/5: Patient is improving but not yet at baseline.  Remains on 2 L nasal cannula.  Remains in rapid atrial fibrillation on Cardizem gtt.  Seems very anxious.   Assessment & Plan:   Principal Problem:   Acute respiratory failure with hypoxia (HCC) Active Problems:   COPD exacerbation (HCC)   Severe sepsis (HCC)   Acute on chronic diastolic CHF (congestive heart failure) (HCC)   HTN (hypertension)   Atrial fibrillation with RVR ([place])   Fall at home, initial encounter   Hyperlipidemia   Hypokalemia   Depression with anxiety   Obesity (BMI 30-39.9)  Acute respiratory failure with hypoxia due and severe sepsis to COPD exacerbation [facility]):  Patient meets SIRS  criteria with leukocytosis, tachypnea, elevated lactic acid.  Procalcitonin negative.  No clinical indication of infection.  Sepsis ruled out.  Suspect that symptoms driven mainly by decompensated COPD. Plan: Continue IV steroids 40 mg Solu-Medrol twice daily DuoNebs every 4 hours Twice daily Brovana Twice daily Pulmicort Azithromycin for anti-inflammatory effect Wean nasal cannula as tolerated Incentive spirometry use No IV fluids  Possible acute on chronic diastolic CHF (congestive heart failure) [facility]): Patient has possible undiagnosed CHF.  2D echo on 08/30/2021 showed EF of 55-60% with indeterminate diastolic dysfunction.  Patient was started on Lasix by cardiologist.  Patient has 1+ leg edema, positive JVD, elevated BNP 128, vascular congestion and interstitial edema on chest x-ray, indicating possible CHF with acute exacerbation. -Patient received 40 mg of Lasix in ED Plan: Repeat 2D echocardiogram 40 mg IV Lasix x 1.  Reassess daily for diuretic needed tolerance Attempted target net 0 fluid balance  HTN (hypertension) PTA metoprolol IV hydralazine as needed   Atrial fibrillation, persistent (HCC) with rapid ventricular response initially HR 80s, her HR goes up to 130s later on Was on Cardizem gtt. Currently heart rate low 100s on 5 mg/h Plan: Wean Cardizem gtt. as tolerated Resume home flecainide and metoprolol Eliquis for VTE prophylaxis  Fall at home, initial encounter  No acute injury.  Patient is strongly denies head or neck injury.   She refused CT scan of the head and neck. -Fall precaution -PT/OT   Hyperlipidemia -Crestor   Hypokalemia:  Monitor and replace as  necessary -Repleted potassium   Depression with anxiety -Continue home medications   Obesity (BMI 30-39.9)  BMI 36.58, body weight 90.7 kg -Exercise and healthy diet -Encourage losing weight  DVT prophylaxis: Eliquis Code Status: Full Family Communication: None today.  Call offered but patient  declined. stating she will call her husband on room Disposition Plan: Status is: Inpatient Remains inpatient appropriate because: Decompensated COPD.  Possible new onset CHF.  Hypoxic respiratory failure.   Level of care: Progressive  Consultants:  None  Procedures:  None  Antimicrobials: Azithromycin   Subjective: Seen and examined.  Very tearful.  Wishes to go home.  Asks repeatedly "am I dying".  Objective: Vitals:   02/14/22 0741 02/14/22 0848 02/14/22 1153 02/14/22 1321  BP: 126/83  112/74   Pulse: 90 96 95   Resp: 16 16 (!) 22   Temp: 98.6 F (37 C)  97.7 F (36.5 C)   TempSrc:      SpO2: 95% 96% 95% 94%  Weight:      Height:        Intake/Output Summary (Last 24 hours) at 02/14/2022 1338 Last data filed at 02/14/2022 1028 Gross per 24 hour  Intake 1499.37 ml  Output 0 ml  Net 1499.37 ml   Filed Weights   02/13/22 0541 02/14/22 0500  Weight: 90.7 kg 93.7 kg    Examination:  General exam: Appears anxious, tearful Respiratory system: Diminished breath sounds bilaterally.  Scattered fine crackles.  Mild end expiratory wheeze.  Normal work of breathing.  2 L Cardiovascular system: S1-S2, tachycardic, irregular rhythm, no murmurs, 1+ pitting edema BLE Gastrointestinal system: Obese, soft, NT/ND, normal bowel sounds Central nervous system: Alert and oriented. No focal neurological deficits. Extremities: Symmetric 5 x 5 power. Skin: No rashes, lesions or ulcers Psychiatry: Judgement and insight appear impaired. Mood & affect tearful.     Data Reviewed: I have personally reviewed following labs and imaging studies  CBC: Recent Labs  Lab 02/13/22 0547  WBC 12.6*  HGB 12.2  HCT 36.7  MCV 90.0  PLT 578*   Basic Metabolic Panel: Recent Labs  Lab 02/13/22 0547 02/13/22 0838 02/14/22 0347  NA 139  --  140  K 3.2*  --  3.3*  CL 107  --  106  CO2 26  --  26  GLUCOSE 119*  --  153*  BUN 13  --  8  CREATININE 0.61  --  0.55  CALCIUM 8.8*  --  9.3   MG  --  1.9 1.9   GFR: Estimated Creatinine Clearance: 67.7 mL/min (by C-G formula based on SCr of 0.55 mg/dL). Liver Function Tests: No results for input(s): "AST", "ALT", "ALKPHOS", "BILITOT", "PROT", "ALBUMIN" in the last 168 hours. No results for input(s): "LIPASE", "AMYLASE" in the last 168 hours. No results for input(s): "AMMONIA" in the last 168 hours. Coagulation Profile: No results for input(s): "INR", "PROTIME" in the last 168 hours. Cardiac Enzymes: No results for input(s): "CKTOTAL", "CKMB", "CKMBINDEX", "TROPONINI" in the last 168 hours. BNP (last 3 results) No results for input(s): "PROBNP" in the last 8760 hours. HbA1C: No results for input(s): "HGBA1C" in the last 72 hours. CBG: No results for input(s): "GLUCAP" in the last 168 hours. Lipid Profile: No results for input(s): "CHOL", "HDL", "LDLCALC", "TRIG", "CHOLHDL", "LDLDIRECT" in the last 72 hours. Thyroid Function Tests: No results for input(s): "TSH", "T4TOTAL", "FREET4", "T3FREE", "THYROIDAB" in the last 72 hours. Anemia Panel: No results for input(s): "VITAMINB12", "FOLATE", "FERRITIN", "TIBC", "IRON", "RETICCTPCT" in the  last 72 hours. Sepsis Labs: Recent Labs  Lab 02/13/22 0347 02/13/22 1112 02/14/22 0347  PROCALCITON <0.10  --   --   LATICACIDVEN  --  4.0* 1.6    Recent Results (from the past 240 hour(s))  Resp Panel by RT-PCR (Flu A&B, Covid) Anterior Nasal Swab     Status: None   Collection Time: 02/13/22  5:57 AM   Specimen: Anterior Nasal Swab  Result Value Ref Range Status   SARS Coronavirus 2 by RT PCR NEGATIVE NEGATIVE Final    Comment: (NOTE) SARS-CoV-2 target nucleic acids are NOT DETECTED.  The SARS-CoV-2 RNA is generally detectable in upper respiratory specimens during the acute phase of infection. The lowest concentration of SARS-CoV-2 viral copies this assay can detect is 138 copies/mL. A negative result does not preclude SARS-Cov-2 infection and should not be used as the sole  basis for treatment or other patient management decisions. A negative result may occur with  improper specimen collection/handling, submission of specimen other than nasopharyngeal swab, presence of viral mutation(s) within the areas targeted by this assay, and inadequate number of viral copies(<138 copies/mL). A negative result must be combined with clinical observations, patient history, and epidemiological information. The expected result is Negative.  Fact Sheet for Patients:  EntrepreneurPulse.com.au  Fact Sheet for Healthcare Providers:  IncredibleEmployment.be  This test is no t yet approved or cleared by the [place] FDA and  has been authorized for detection and/or diagnosis of SARS-CoV-2 by FDA under an Emergency Use Authorization (EUA). This EUA will remain  in effect (meaning this test can be used) for the duration of the COVID-19 declaration under Section 564(b)(1) of the Act, 21 U.S.C.section 360bbb-3(b)(1), unless the authorization is terminated  or revoked sooner.       Influenza A by PCR NEGATIVE NEGATIVE Final   Influenza B by PCR NEGATIVE NEGATIVE Final    Comment: (NOTE) The Xpert Xpress SARS-CoV-2/FLU/RSV plus assay is intended as an aid in the diagnosis of influenza from Nasopharyngeal swab specimens and should not be used as a sole basis for treatment. Nasal washings and aspirates are unacceptable for Xpert Xpress SARS-CoV-2/FLU/RSV testing.  Fact Sheet for Patients: EntrepreneurPulse.com.au  Fact Sheet for Healthcare Providers: IncredibleEmployment.be  This test is not yet approved or cleared by the [place] FDA and has been authorized for detection and/or diagnosis of SARS-CoV-2 by FDA under an Emergency Use Authorization (EUA). This EUA will remain in effect (meaning this test can be used) for the duration of the COVID-19 declaration under Section 564(b)(1) of the Act,  21 U.S.C. section 360bbb-3(b)(1), unless the authorization is terminated or revoked.  Performed at [facility], [address] [place], [place] [address]          Radiology Studies: [facility] Chest [place] 1 View  Result Date: 02/13/2022 CLINICAL DATA:  Shortness of breath. EXAM: PORTABLE CHEST 1 VIEW COMPARISON:  01/29/2018 FINDINGS: 0607 hours. Low volume film. The cardio pericardial silhouette is enlarged. There is pulmonary vascular congestion without overt pulmonary edema. Diffuse interstitial opacity suggests edema. No substantial pleural effusion. Telemetry leads overlie the chest. IMPRESSION: Low volume film with vascular congestion and interstitial edema. Electronically Signed   By: Misty Stanley M.D.   On: 02/13/2022 06:19        Scheduled Meds:  apixaban  5 mg Oral BID   arformoterol  15 mcg Nebulization BID   azithromycin  250 mg Oral Daily   budesonide (PULMICORT) nebulizer solution  0.25 mg Nebulization BID  busPIRone  30 mg Oral TID   fesoterodine  4 mg Oral Daily   flecainide  100 mg Oral BID   ipratropium-albuterol  3 mL Nebulization TID   methylPREDNISolone (SOLU-MEDROL) injection  40 mg Intravenous Q12H   metoprolol succinate  50 mg Oral BID   potassium chloride  40 mEq Oral Daily   rosuvastatin  10 mg Oral Daily   Continuous Infusions:  diltiazem (CARDIZEM) infusion Stopped (02/14/22 1022)     LOS: 1 day     Sidney Ace, MD [facility]   If 7PM-7AM, please contact night-coverage  02/14/2022, 1:38 PM

## 2022-02-14 NOTE — Evaluation (Signed)
Physical Therapy Evaluation Patient Details Name: Anita Harrell MRN: [id number] DOB: Nov 04, 1950 Today's Date: 02/14/2022  History of Present Illness  71 y/o female presented to ED on 02/13/22 for SOB and wheezing. Admitted for acute respiratory failure with severe sepsis 2/2 COPD exacerbation PMH: HTN, COPD, anxiety, panic disorder, Afib on Eliquis, CHF  Clinical Impression  Patient admitted with the above. PTA, patient lives with husband and was independent. Patient presents with weakness, impaired balance, and decreased activity tolerance. Able to complete bed mobility modI and sit to stand transfer with supervision. Ambulated in hallway with HHAx1 and supervision due to mild balance deficits. Encouraged use of cane at home for support, patient verbalized understanding. Patient will benefit from skilled PT services during acute stay to address listed deficits. Recommend OPPT at discharge to address balance and strength deficits.        Recommendations for follow up therapy are one component of a multi-disciplinary discharge planning process, led by the attending physician.  Recommendations may be updated based on patient status, additional functional criteria and insurance authorization.  Follow Up Recommendations Outpatient PT      Assistance Recommended at Discharge Intermittent Supervision/Assistance  Patient can return home with the following  A little help with walking and/or transfers;A little help with bathing/dressing/bathroom;Help with stairs or ramp for entrance;Assist for transportation;Assistance with cooking/housework    Equipment Recommendations None recommended by PT  Recommendations for Other Services       Functional Status Assessment Patient has had a recent decline in their functional status and demonstrates the ability to make significant improvements in function in a reasonable and predictable amount of time.     Precautions / Restrictions  Precautions Precautions: Fall Restrictions Weight Bearing Restrictions: No      Mobility  Bed Mobility Overal bed mobility: Modified Independent                  Transfers Overall transfer level: Needs assistance Equipment used: None Transfers: Sit to/from Stand Sit to Stand: Supervision                Ambulation/Gait Ambulation/Gait assistance: Supervision Gait Distance (Feet): 100 Feet Assistive device: None, 1 person hand held assist Gait Pattern/deviations: Step-through pattern Gait velocity: decreased     General Gait Details: supervision for safety. Unsteady initially but improved with HHAx1.  Stairs            Wheelchair Mobility    Modified Rankin (Stroke Patients Only)       Balance Overall balance assessment: Needs assistance Sitting-balance support: Feet supported Sitting balance-Leahy Scale: Normal     Standing balance support: Single extremity supported, During functional activity Standing balance-Leahy Scale: Fair                               Pertinent Vitals/Pain Pain Assessment Pain Assessment: No/denies pain    Home Living Family/patient expects to be discharged to:: Private residence Living Arrangements: Spouse/significant other Available Help at Discharge: Family;Available 24 hours/day Type of Home: House Home Access: Level entry       Home Layout: One level;Other (Comment) (71 y/o female presented to ED on 02/13/22 for SOB and wheezing. Admitted for acute respiratory failure with severe sepsis 2/2 COPD exacerbation PMH: HTN, COPD, anxiety, panic disorder, Afib on Eliquis, CHF) Home Equipment: Rolling Rennee Coyne (2 wheels);Cane - single point;Grab bars - tub/shower;Grab bars - toilet      Prior Function Prior Level of Function :  Independent/Modified Independent;History of Falls (last six months)             Mobility Comments: Ind without AD. 1 fall ~ 2 days ago ADLs Comments: Husband is "around" and  helps her get out of the tub and drives. She does all other ADL tasks independently.     Hand Dominance   Dominant Hand: Right    Extremity/Trunk Assessment   Upper Extremity Assessment Upper Extremity Assessment: Defer to OT evaluation    Lower Extremity Assessment Lower Extremity Assessment: Generalized weakness    Cervical / Trunk Assessment Cervical / Trunk Assessment: Normal  Communication   Communication: No difficulties  Cognition Arousal/Alertness: Awake/alert Behavior During Therapy: WFL for tasks assessed/performed Overall Cognitive Status: Within Functional Limits for tasks assessed                                          General Comments General comments (skin integrity, edema, etc.): SpO2 96% on RA throughout mobility    Exercises     Assessment/Plan    PT Assessment Patient needs continued PT services  PT Problem List Decreased strength;Decreased balance;Decreased activity tolerance;Decreased mobility;Decreased knowledge of use of DME;Cardiopulmonary status limiting activity       PT Treatment Interventions Gait training;DME instruction;Functional mobility training;Therapeutic activities;Therapeutic exercise;Stair training;Balance training;Patient/family education    PT Goals (Current goals can be found in the Care Plan section)  Acute Rehab PT Goals Patient Stated Goal: to go home PT Goal Formulation: With patient Time For Goal Achievement: 02/28/22 Potential to Achieve Goals: Good    Frequency Min 2X/week     Co-evaluation               AM-PAC PT "6 Clicks" Mobility  Outcome Measure Help needed turning from your back to your side while in a flat bed without using bedrails?: None Help needed moving from lying on your back to sitting on the side of a flat bed without using bedrails?: None Help needed moving to and from a bed to a chair (including a wheelchair)?: A Little Help needed standing up from a chair using your  arms (e.g., wheelchair or bedside chair)?: A Little Help needed to walk in hospital room?: A Little Help needed climbing 3-5 steps with a railing? : A Little 6 Click Score: 20    End of Session   Activity Tolerance: Patient tolerated treatment well Patient left: in bed;with call bell/phone within reach;with nursing/sitter in room;with family/visitor present Nurse Communication: Mobility status PT Visit Diagnosis: Muscle weakness (generalized) (M62.81);Unsteadiness on feet (R26.81)    Time: 9622-2979 PT Time Calculation (min) (ACUTE ONLY): 21 min   Charges:   PT Evaluation $PT Eval Low Complexity: 1 Low          Alize Acy A. Gilford Rile PT, DPT [facility] - Acute Rehabilitation Services   Jenipher Havel A Cameryn Schum 02/14/2022, 12:58 PM

## 2022-02-15 ENCOUNTER — Telehealth: Payer: Self-pay | Admitting: Family Medicine

## 2022-02-15 DIAGNOSIS — J441 Chronic obstructive pulmonary disease with (acute) exacerbation: Secondary | ICD-10-CM | POA: Diagnosis not present

## 2022-02-15 DIAGNOSIS — I10 Essential (primary) hypertension: Secondary | ICD-10-CM

## 2022-02-15 DIAGNOSIS — I5033 Acute on chronic diastolic (congestive) heart failure: Secondary | ICD-10-CM

## 2022-02-15 DIAGNOSIS — I4891 Unspecified atrial fibrillation: Secondary | ICD-10-CM

## 2022-02-15 DIAGNOSIS — J9601 Acute respiratory failure with hypoxia: Secondary | ICD-10-CM | POA: Diagnosis not present

## 2022-02-15 DIAGNOSIS — E7800 Pure hypercholesterolemia, unspecified: Secondary | ICD-10-CM

## 2022-02-15 MED ORDER — FUROSEMIDE 40 MG PO TABS: 40.0000 mg | ORAL_TABLET | Freq: Every day | ORAL | 0 refills | Status: DC

## 2022-02-15 MED ORDER — POTASSIUM CHLORIDE ER 20 MEQ PO TBCR: 20.0000 meq | EXTENDED_RELEASE_TABLET | Freq: Every day | ORAL | 0 refills | Status: DC

## 2022-02-15 MED ORDER — BUDESONIDE-FORMOTEROL FUMARATE 80-4.5 MCG/ACT IN AERO: 2.0000 | INHALATION_SPRAY | Freq: Two times a day (BID) | RESPIRATORY_TRACT | 0 refills | Status: DC

## 2022-02-15 MED ORDER — ALBUTEROL SULFATE HFA 108 (90 BASE) MCG/ACT IN AERS: 2.0000 | INHALATION_SPRAY | Freq: Four times a day (QID) | RESPIRATORY_TRACT | 2 refills | Status: DC | PRN

## 2022-02-15 MED ORDER — AZITHROMYCIN 250 MG PO TABS: ORAL_TABLET | ORAL | 0 refills | Status: DC

## 2022-02-15 MED ORDER — PREDNISONE 10 MG PO TABS: ORAL_TABLET | ORAL | 0 refills | Status: DC

## 2022-02-15 NOTE — Assessment & Plan Note (Signed)
EF 50%.  Likely more COPD rather than CHF.  Refer to CHF clinic.  Continue Toprol and Lasix.

## 2022-02-15 NOTE — Assessment & Plan Note (Signed)
Patient initially had a pulse ox of 85% on room air.  This problem has resolved and patient breathing comfortably on room air.

## 2022-02-15 NOTE — Assessment & Plan Note (Signed)
Blood pressure stable on Toprol

## 2022-02-15 NOTE — TOC CM/SW Note (Signed)
Patient has orders to discharge home today. Per chart review, she is already active with outpatient PT at [facility]. Sent updated order to include OT per OT evaluation recommendations. No further concerns. CSW signing off.  Dayton Scrape, [place]

## 2022-02-15 NOTE — TOC CM/SW Note (Signed)
[place] REFERRAL        Occupational Therapy * Physical Therapy * Speech Therapy                           DATE _12/6/2023__       PATIENT NAME_[name]_  PATIENT [id number]_       DIAGNOSIS/DIAGNOSIS CODE _J96.01, I48.91, J44.1, W19.Merril Abbe, I50.33__ DATE OF DISCHARGE: _12/6/2023__       PRIMARY CARE PHYSICIAN Loura Pardon, MD_    PCP PHONE/FAX_P: [phone]      Dear Provider (Name: [facility]  Fax: [phone]):   I certify that I have examined this patient and that occupational/physical/speech therapy is necessary on an outpatient basis.    The patient has expressed interest in completing their recommended course of therapy at your  location.  Once a formal order from the patient's primary care physician has been obtained, please  contact him/her to schedule an appointment for evaluation at your earliest convenience.   [ x ]  Physical Therapy Evaluate and Treat          [ x ]  Occupational Therapy Evaluate and Treat                   [  ]  Speech Therapy Evaluate and Treat         The patient's primary care physician (listed above) must furnish and be responsible for a formal order such that the recommended services may be furnished while under the primary physician's care, and that the plan of care will be established and reviewed every 30 days (or more often if condition necessitates).

## 2022-02-15 NOTE — Telephone Encounter (Signed)
Staff message noted:  PT after hospitalization Received: Today Nancy Nordmann, PT  Madalin Hughart, Wynelle Fanny, MD Hello Dr. Glori Bickers,  Our clinic just got a fax from the hospital social worker recommending outpatient PT and OT since patient Anita Harrell's recent hospitalization, but noting that she will see you first and that you will send official PT and OT orders.  Prior to her hospitalization I was seeing [facility] for chronic low back pain under a PT order from Dr. Owens Loffler, MD (dx from Dr. Edilia Bo was lumbosacral degenerative disc disease, chronic back pain greater than 3 months duration).  When you see her (tomorrow I think) and if you agree, would you please include low back pain as one of the diagnoses on the PT order, so I can continue to address the low back pain in addition to the diagnoses you wish to be addressed after hospitalization?  She has a visit with me already scheduled for Monday, so we should be able to get her in quickly for PT if you feel it is appropriate and we get the PT order by then.  Thank you,  Everlean Alstrom. Graylon Good, PT, DPT 02/15/22, 11:02 AM  [facility] [address] [place], [place] [address] P: [phone] I F: [phone]  Will plan to address this at her visit

## 2022-02-15 NOTE — Assessment & Plan Note (Signed)
Continue flecainide and Toprol for rate control and Eliquis for anticoagulation.

## 2022-02-15 NOTE — Assessment & Plan Note (Signed)
Continue Crestor 

## 2022-02-15 NOTE — Assessment & Plan Note (Signed)
Patient was treated with Solu-Medrol and nebulizer treatments.  Prescribed steroid inhaler and prednisone upon going home.  Patient has albuterol inhaler at home.

## 2022-02-15 NOTE — Assessment & Plan Note (Signed)
Replaced during the hospital course 

## 2022-02-15 NOTE — Hospital Course (Signed)
71 y.o. female with medical history significant of hypertension, hyperlipidemia, COPD not on oxygen at home, depression with anxiety, panic disorder, tobacco abuse, atrial fibrillation on Eliquis, possible undiagnosed CHF (patient is on Lasix), who presents with shortness breath.   Patient states that she has shortness of breath for more than 3 days, which has been progressively worsening.  Patient has dry cough, wheezing, no chest pain, fever or chills.  Patient denies chest pain.  No nausea, vomiting, diarrhea or abdominal pain.  No symptoms of UTI. Patient states that she tripped her steps and fell on Saturday night.  No loss of consciousness.  No significant injury. Patient strongly denies any head or neck injury.  She refused CT scan of head and neck.  She has bruise over left forearm, but no left arm pain.  Patient has a bilateral lower leg edema.   Patient is using oxygen normally, but was found to have oxygen desaturation 85% on room air, which improved to 98% on 2 L oxygen.  Patient has difficulty speaking in full sentence.  Patient is in no acute respiratory distress.  Patient is very anxious.   12/5: Patient is improving but not yet at baseline.  Remains on 2 L nasal cannula.  Remains in rapid atrial fibrillation on Cardizem gtt.  Seems very anxious.  12/6.  I was called by nursing staff that the patient wanted to go home as soon as I came into the hospital.  I saw the patient and she was stable for discharge home.

## 2022-02-15 NOTE — Assessment & Plan Note (Signed)
BMI 37.42

## 2022-02-15 NOTE — Discharge Summary (Signed)
Physician Discharge Summary   Patient: ITHA KROEKER MRN: [id number] DOB: 1950-12-06  Admit date:     02/13/2022  Discharge date: 02/15/22  Discharge Physician: Loletha Grayer   PCP: Abner Greenspan, MD   Recommendations at discharge:   Follow-up PCP 5 days  Discharge Diagnoses: Principal Problem:   Acute respiratory failure with hypoxia (HCC) Active Problems:   COPD exacerbation (HCC)   Acute on chronic diastolic CHF (congestive heart failure) (HCC)   HTN (hypertension)   Atrial fibrillation with RVR ([place])   Fall at home, initial encounter   Hyperlipidemia   Hypokalemia   Depression with anxiety   Obesity (BMI 30-39.9)    Hospital Course: 71 y.o. female with medical history significant of hypertension, hyperlipidemia, COPD not on oxygen at home, depression with anxiety, panic disorder, tobacco abuse, atrial fibrillation on Eliquis, possible undiagnosed CHF (patient is on Lasix), who presents with shortness breath.   Patient states that she has shortness of breath for more than 3 days, which has been progressively worsening.  Patient has dry cough, wheezing, no chest pain, fever or chills.  Patient denies chest pain.  No nausea, vomiting, diarrhea or abdominal pain.  No symptoms of UTI. Patient states that she tripped her steps and fell on Saturday night.  No loss of consciousness.  No significant injury. Patient strongly denies any head or neck injury.  She refused CT scan of head and neck.  She has bruise over left forearm, but no left arm pain.  Patient has a bilateral lower leg edema.   Patient is using oxygen normally, but was found to have oxygen desaturation 85% on room air, which improved to 98% on 2 L oxygen.  Patient has difficulty speaking in full sentence.  Patient is in no acute respiratory distress.  Patient is very anxious.   12/5: Patient is improving but not yet at baseline.  Remains on 2 L nasal cannula.  Remains in rapid atrial fibrillation on Cardizem gtt.   Seems very anxious.  12/6.  I was called by nursing staff that the patient wanted to go home as soon as I came into the hospital.  I saw the patient and she was stable for discharge home.  Assessment and Plan: * Acute respiratory failure with hypoxia ([facility]) Patient initially had a pulse ox of 85% on room air.  This problem has resolved and patient breathing comfortably on room air.  COPD exacerbation ([place]) Patient was treated with Solu-Medrol and nebulizer treatments.  Prescribed steroid inhaler and prednisone upon going home.  Patient has albuterol inhaler at home.  Acute on chronic diastolic CHF (congestive heart failure) (HCC) EF 50%.  Likely more COPD rather than CHF.  Refer to CHF clinic.  Continue Toprol and Lasix.  HTN (hypertension) Blood pressure stable on Toprol  Atrial fibrillation with RVR (HCC) Continue flecainide and Toprol for rate control and Eliquis for anticoagulation.  Hyperlipidemia Continue Crestor  Hypokalemia Replaced during the hospital course  Obesity (BMI 30-39.9) BMI 37.42   Sepsis ruled out      Consultants: None Procedures performed: None Disposition: Home Diet recommendation:  Cardiac diet DISCHARGE MEDICATION: Allergies as of 02/15/2022       Reactions   Abilify [aripiprazole]    Vision problem   Codeine Nausea And Vomiting   REACTION: Nausea and vomiting Pt has tolerated vicodin & percocet in the past   Cymbalta [duloxetine Hcl]    More depressed/tearful    Lipitor [atorvastatin]    Muscle pain  Wellbutrin [bupropion]         Medication List     STOP taking these medications    famotidine 20 MG tablet Commonly known as: PEPCID   sertraline 25 MG tablet Commonly known as: ZOLOFT       TAKE these medications    acetaminophen 650 MG CR tablet Commonly known as: TYLENOL Take 650 mg by mouth every 8 (eight) hours as needed for pain.   albuterol 108 (90 Base) MCG/ACT inhaler Commonly known as: VENTOLIN  HFA Inhale 2 puffs into the lungs every 6 (six) hours as needed for wheezing or shortness of breath.   azithromycin 250 MG tablet Commonly known as: ZITHROMAX One tab po daily for two more doses Start taking on: February 16, 2022   budesonide-formoterol 80-4.5 MCG/ACT inhaler Commonly known as: Symbicort Inhale 2 puffs into the lungs 2 (two) times daily.   busPIRone 30 MG tablet Commonly known as: BUSPAR TAKE ONE HALF TO 1 TABLET BY MOUTH 3 TIMES DAILY AS DIRECTED   cyclobenzaprine 10 MG tablet Commonly known as: FLEXERIL Take 0.5-1 tablets (5-10 mg total) by mouth at bedtime as needed for muscle spasms.   Eliquis 5 MG Tabs tablet Generic drug: apixaban TAKE 1 TABLET BY MOUTH TWICE A DAY   flecainide 100 MG tablet Commonly known as: TAMBOCOR Take 1 tablet (100 mg total) by mouth 2 (two) times daily.   furosemide 40 MG tablet Commonly known as: LASIX Take 1 tablet (40 mg total) by mouth daily. What changed:  medication strength how much to take   metoprolol succinate 50 MG 24 hr tablet Commonly known as: TOPROL-XL TAKE 1 TABLET BY MOUTH TWICE A DAY WITH MEALS   Potassium Chloride ER 20 MEQ Tbcr Take 20 mEq by mouth daily. What changed:  medication strength how much to take   predniSONE 10 MG tablet Commonly known as: DELTASONE Four tabs o daily for two more days Start taking on: February 16, 2022   rosuvastatin 10 MG tablet Commonly known as: CRESTOR TAKE 1 TABLET BY MOUTH ONCE A DAY   solifenacin 10 MG tablet Commonly known as: VESICARE TAKE 1 TABLET BY MOUTH ONCE A DAY   traMADol 50 MG tablet Commonly known as: ULTRAM Take 1-2 tablets (50-100 mg total) by mouth every 8 (eight) hours as needed for moderate pain.   zolpidem 12.5 MG CR tablet Commonly known as: AMBIEN CR TAKE 1 TABLET BY MOUTH ONCE DAILY AT BEDTIME AS NEEDED FOR SLEEP        Follow-up Information     Tower, Wynelle Fanny, MD. Go in 5 day(s).   Specialties: Family Medicine, Radiology Why:  Appointment on Wednesday, 02/22/2022 at [place] information: [place] [address] [phone]         Minna Merritts, MD. Go in 1 week(s).   Specialty: Cardiology Why: Appointment on Tuesday, 02/21/2022 at 10:00am with Christell Faith, PA. Contact information: [place] [address] [phone]                Discharge Exam: Filed Weights   02/13/22 0541 02/14/22 0500 02/15/22 0500  Weight: 90.7 kg 93.7 kg 92.8 kg   Physical Exam HENT:     Head: Normocephalic.     Mouth/Throat:     Pharynx: No oropharyngeal exudate.  Eyes:     General: Lids are normal.     Conjunctiva/sclera: Conjunctivae normal.  Cardiovascular:     Rate and Rhythm: Normal  rate and regular rhythm.     Heart sounds: Normal heart sounds, S1 normal and S2 normal.  Pulmonary:     Breath sounds: Examination of the right-lower field reveals decreased breath sounds. Examination of the left-lower field reveals decreased breath sounds. Decreased breath sounds present. No wheezing, rhonchi or rales.  Abdominal:     Palpations: Abdomen is soft.     Tenderness: There is no abdominal tenderness.  Musculoskeletal:     Right lower leg: No swelling.     Left lower leg: No swelling.  Skin:    General: Skin is warm.     Findings: No rash.  Neurological:     Mental Status: She is alert and oriented to person, place, and time.      Condition at discharge: serious  The results of significant diagnostics from this hospitalization (including imaging, microbiology, ancillary and laboratory) are listed below for reference.   Imaging Studies: ECHOCARDIOGRAM COMPLETE  Result Date: 02/14/2022    ECHOCARDIOGRAM REPORT   Patient Name:   KINZEY SHERIFF Date of Exam: 02/14/2022 Medical Rec #:  [id number]        Height:       62.0 in Accession #:    [id number]       Weight:       206.5 lb Date of Birth:  Nov 21, 1950       BSA:          1.938 m Patient Age:     57 years         BP:           126/83 mmHg Patient Gender: F                HR:           100 bpm. Exam Location:  [facility] Procedure: 2D Echo, Color Doppler, Cardiac Doppler and Intracardiac            Opacification Agent Indications:     I50.31 congestive heart failure-Acute Diastolic  History:         Patient has prior history of Echocardiogram examinations, most                  recent 08/30/2021. Risk Factors:Hypertension and Dyslipidemia.  Sonographer:     Charmayne Sheer Referring Phys:  [id number] Ivor Costa Diagnosing Phys: Nelva Bush MD  Sonographer Comments: Suboptimal apical window and suboptimal subcostal window. Image acquisition challenging due to patient body habitus. IMPRESSIONS  1. Left ventricular ejection fraction, by estimation, is 50 to 55%. The left ventricle has low normal function. The left ventricle has no regional wall motion abnormalities. There is mild left ventricular hypertrophy. Left ventricular diastolic function  could not be evaluated.  2. Right ventricular systolic function is normal. The right ventricular size is normal. There is normal pulmonary artery systolic pressure.  3. The mitral valve is normal in structure. Trivial mitral valve regurgitation.  4. The aortic valve has an indeterminant number of cusps. Aortic valve regurgitation is not visualized. No aortic stenosis is present.  5. The inferior vena cava is normal in size with greater than 50% respiratory variability, suggesting right atrial pressure of 3 mmHg. FINDINGS  Left Ventricle: Left ventricular ejection fraction, by estimation, is 50 to 55%. The left ventricle has low normal function. The left ventricle has no regional wall motion abnormalities. Definity contrast agent was given IV to delineate the left ventricular endocardial borders. The left ventricular internal cavity size was normal in size. There  is mild left ventricular hypertrophy. Left ventricular diastolic function could not be evaluated due to atrial fibrillation.  Left ventricular diastolic function could not be evaluated. Right Ventricle: The right ventricular size is normal. No increase in right ventricular wall thickness. Right ventricular systolic function is normal. There is normal pulmonary artery systolic pressure. The tricuspid regurgitant velocity is 2.57 m/s, and  with an assumed right atrial pressure of 3 mmHg, the estimated right ventricular systolic pressure is 86.7 mmHg. Left Atrium: Left atrial size was normal in size. Right Atrium: Right atrial size was not well visualized. Pericardium: There is no evidence of pericardial effusion. Presence of epicardial fat layer. Mitral Valve: The mitral valve is normal in structure. Mild mitral annular calcification. Trivial mitral valve regurgitation. Tricuspid Valve: The tricuspid valve is normal in structure. Tricuspid valve regurgitation is mild. Aortic Valve: The aortic valve has an indeterminant number of cusps. Aortic valve regurgitation is not visualized. No aortic stenosis is present. Aortic valve mean gradient measures 4.0 mmHg. Aortic valve peak gradient measures 8.6 mmHg. Aortic valve area, by VTI measures 2.67 cm. Pulmonic Valve: The pulmonic valve was not well visualized. Pulmonic valve regurgitation is not visualized. No evidence of pulmonic stenosis. Aorta: The aortic root and ascending aorta are structurally normal, with no evidence of dilitation. Pulmonary Artery: The pulmonary artery is of normal size. Venous: The inferior vena cava is normal in size with greater than 50% respiratory variability, suggesting right atrial pressure of 3 mmHg. IAS/Shunts: No atrial level shunt detected by color flow Doppler.  LEFT VENTRICLE PLAX 2D LVIDd:         4.50 cm   Diastology LVIDs:         3.30 cm   LV e' medial:    11.50 cm/s LV PW:         1.32 cm   LV E/e' medial:  11.0 LV IVS:        1.27 cm   LV e' lateral:   14.50 cm/s LVOT diam:     2.00 cm   LV E/e' lateral: 8.8 LV SV:         69 LV SV Index:   36 LVOT Area:      3.14 cm  RIGHT VENTRICLE RV Basal diam:  3.50 cm LEFT ATRIUM           Index LA diam:      4.70 cm 2.43 cm/m LA Vol (A4C): 52.5 ml 27.09 ml/m  AORTIC VALVE                    PULMONIC VALVE AV Area (Vmax):    2.27 cm     PV Vmax:       0.83 m/s AV Area (Vmean):   2.45 cm     PV Vmean:      62.900 cm/s AV Area (VTI):     2.67 cm     PV VTI:        0.148 m AV Vmax:           147.00 cm/s  PV Peak grad:  2.8 mmHg AV Vmean:          95.500 cm/s  PV Mean grad:  2.0 mmHg AV VTI:            0.258 m AV Peak Grad:      8.6 mmHg AV Mean Grad:      4.0 mmHg LVOT Vmax:         106.00 cm/s  LVOT Vmean:        74.600 cm/s LVOT VTI:          0.219 m LVOT/AV VTI ratio: 0.85  AORTA Ao Root diam: 3.30 cm MITRAL VALVE                TRICUSPID VALVE MV Area (PHT): 3.87 cm     TR Peak grad:   26.4 mmHg MV Decel Time: 196 msec     TR Vmax:        257.00 cm/s MV E velocity: 127.00 cm/s                             SHUNTS                             Systemic VTI:  0.22 m                             Systemic Diam: 2.00 cm Nelva Bush MD Electronically signed by Nelva Bush MD Signature Date/Time: 02/14/2022/3:57:59 PM    Final    DG Chest Port 1 View  Result Date: 02/13/2022 CLINICAL DATA:  Shortness of breath. EXAM: PORTABLE CHEST 1 VIEW COMPARISON:  01/29/2018 FINDINGS: 0607 hours. Low volume film. The cardio pericardial silhouette is enlarged. There is pulmonary vascular congestion without overt pulmonary edema. Diffuse interstitial opacity suggests edema. No substantial pleural effusion. Telemetry leads overlie the chest. IMPRESSION: Low volume film with vascular congestion and interstitial edema. Electronically Signed   By: Misty Stanley M.D.   On: 02/13/2022 06:19   DG Lumbar Spine Complete  Result Date: 01/28/2022 CLINICAL DATA:  Degenerative disc disease.  Chronic low back pain. EXAM: LUMBAR SPINE - COMPLETE 4+ VIEW COMPARISON:  CT abdomen pelvis February 16, 2015 FINDINGS: Mild leftward curvature of the mid  lumbar spine. Oblique views are somewhat limited. Grade 1 anterolisthesis of L5 on S1 likely secondary to extensive L5-S1 facet degenerative changes. Retrolisthesis of L2 on L3. Multilevel degenerative disc disease at each level with disc space narrowing and endplate osteophytosis. Extensive multilevel facet degenerative changes most pronounced L4-5 and L5-S1. Aortic atherosclerosis. Right upper quadrant calcification, nonspecific, potentially cholelithiasis. IMPRESSION: Extensive multilevel degenerative disc and facet disease most pronounced L4-5 and L5-S1. Grade 1 anterolisthesis L5 on S1. Electronically Signed   By: Lovey Newcomer M.D.   On: 01/28/2022 07:32    Microbiology: Results for orders placed or performed during the hospital encounter of 02/13/22  Resp Panel by RT-PCR (Flu A&B, Covid) Anterior Nasal Swab     Status: None   Collection Time: 02/13/22  5:57 AM   Specimen: Anterior Nasal Swab  Result Value Ref Range Status   SARS Coronavirus 2 by RT PCR NEGATIVE NEGATIVE Final    Comment: (NOTE) SARS-CoV-2 target nucleic acids are NOT DETECTED.  The SARS-CoV-2 RNA is generally detectable in upper respiratory specimens during the acute phase of infection. The lowest concentration of SARS-CoV-2 viral copies this assay can detect is 138 copies/mL. A negative result does not preclude SARS-Cov-2 infection and should not be used as the sole basis for treatment or other patient management decisions. A negative result may occur with  improper specimen collection/handling, submission of specimen other than nasopharyngeal swab, presence of viral mutation(s) within the areas targeted by this assay, and inadequate number of viral copies(<138 copies/mL). A negative result must be combined  with clinical observations, patient history, and epidemiological information. The expected result is Negative.  Fact Sheet for Patients:  EntrepreneurPulse.com.au  Fact Sheet for Healthcare  Providers:  IncredibleEmployment.be  This test is no t yet approved or cleared by the [place] FDA and  has been authorized for detection and/or diagnosis of SARS-CoV-2 by FDA under an Emergency Use Authorization (EUA). This EUA will remain  in effect (meaning this test can be used) for the duration of the COVID-19 declaration under Section 564(b)(1) of the Act, 21 U.S.C.section 360bbb-3(b)(1), unless the authorization is terminated  or revoked sooner.       Influenza A by PCR NEGATIVE NEGATIVE Final   Influenza B by PCR NEGATIVE NEGATIVE Final    Comment: (NOTE) The Xpert Xpress SARS-CoV-2/FLU/RSV plus assay is intended as an aid in the diagnosis of influenza from Nasopharyngeal swab specimens and should not be used as a sole basis for treatment. Nasal washings and aspirates are unacceptable for Xpert Xpress SARS-CoV-2/FLU/RSV testing.  Fact Sheet for Patients: EntrepreneurPulse.com.au  Fact Sheet for Healthcare Providers: IncredibleEmployment.be  This test is not yet approved or cleared by the [place] FDA and has been authorized for detection and/or diagnosis of SARS-CoV-2 by FDA under an Emergency Use Authorization (EUA). This EUA will remain in effect (meaning this test can be used) for the duration of the COVID-19 declaration under Section 564(b)(1) of the Act, 21 U.S.C. section 360bbb-3(b)(1), unless the authorization is terminated or revoked.  Performed at [facility], [address] [place], [place] [address]     Labs: CBC: Recent Labs  Lab 02/13/22 0547  WBC 12.6*  HGB 12.2  HCT 36.7  MCV 90.0  PLT 093*   Basic Metabolic Panel: Recent Labs  Lab 02/13/22 0547 02/13/22 0838 02/14/22 0347  NA 139  --  140  K 3.2*  --  3.3*  CL 107  --  106  CO2 26  --  26  GLUCOSE 119*  --  153*  BUN 13  --  8  CREATININE 0.61  --  0.55  CALCIUM 8.8*  --  9.3  MG  --  1.9 1.9      Discharge time spent: greater than 30 minutes.  Signed: Loletha Grayer, MD [facility] 02/15/2022

## 2022-02-16 ENCOUNTER — Ambulatory Visit (INDEPENDENT_AMBULATORY_CARE_PROVIDER_SITE_OTHER): Payer: Medicare Other | Admitting: Family Medicine

## 2022-02-16 ENCOUNTER — Encounter: Payer: Self-pay | Admitting: Family Medicine

## 2022-02-16 ENCOUNTER — Ambulatory Visit: Payer: Medicare Other | Admitting: Physical Therapy

## 2022-02-16 ENCOUNTER — Encounter: Payer: Medicare Other | Admitting: Physical Therapy

## 2022-02-16 VITALS — BP 128/72 | HR 106 | Temp 96.9°F | Ht 62.0 in | Wt 207.1 lb

## 2022-02-16 DIAGNOSIS — G8929 Other chronic pain: Secondary | ICD-10-CM | POA: Diagnosis not present

## 2022-02-16 DIAGNOSIS — I10 Essential (primary) hypertension: Secondary | ICD-10-CM

## 2022-02-16 DIAGNOSIS — J441 Chronic obstructive pulmonary disease with (acute) exacerbation: Secondary | ICD-10-CM | POA: Diagnosis not present

## 2022-02-16 DIAGNOSIS — R079 Chest pain, unspecified: Secondary | ICD-10-CM | POA: Insufficient documentation

## 2022-02-16 DIAGNOSIS — J9601 Acute respiratory failure with hypoxia: Secondary | ICD-10-CM | POA: Diagnosis not present

## 2022-02-16 DIAGNOSIS — R296 Repeated falls: Secondary | ICD-10-CM | POA: Diagnosis not present

## 2022-02-16 DIAGNOSIS — I4891 Unspecified atrial fibrillation: Secondary | ICD-10-CM

## 2022-02-16 DIAGNOSIS — F418 Other specified anxiety disorders: Secondary | ICD-10-CM | POA: Diagnosis not present

## 2022-02-16 DIAGNOSIS — R2689 Other abnormalities of gait and mobility: Secondary | ICD-10-CM

## 2022-02-16 DIAGNOSIS — F5104 Psychophysiologic insomnia: Secondary | ICD-10-CM

## 2022-02-16 DIAGNOSIS — I5033 Acute on chronic diastolic (congestive) heart failure: Secondary | ICD-10-CM | POA: Diagnosis not present

## 2022-02-16 DIAGNOSIS — R531 Weakness: Secondary | ICD-10-CM | POA: Diagnosis not present

## 2022-02-16 DIAGNOSIS — M5441 Lumbago with sciatica, right side: Secondary | ICD-10-CM | POA: Diagnosis not present

## 2022-02-16 NOTE — Assessment & Plan Note (Signed)
S/p hosp for copd/chf She wants to do PT out of the house  Order placed for Rosita Kea  (also for back pain)

## 2022-02-16 NOTE — Assessment & Plan Note (Signed)
From anxiety most likely  Chronic  Taking Lorrin Mais  She has more odd night time behavior and falls  I enc her to consider stopping it  She would like to first disc opt for tx with her psychiatrist

## 2022-02-16 NOTE — Assessment & Plan Note (Signed)
Causing hypoxia and resp distress along with chf  Reviewed hospital records, lab results and studies in detail    Now much improved Finishing prednisone and zithromax Using symbicort Albut mdi prn  Reassuring exam Enc strongly to quit vaping now

## 2022-02-16 NOTE — Assessment & Plan Note (Addendum)
This is intermittent HR is irreg today  Pt reports occ brief cp that may be worse with anxiety -inst to d/w cardiology Flecainide  Metoprolol Eliquis Under cardiology care

## 2022-02-16 NOTE — Assessment & Plan Note (Signed)
Incl this dx for her PT to work on it  Has had several falls

## 2022-02-16 NOTE — Assessment & Plan Note (Signed)
Falling more  May be multifactorial  Some worrisome night time behavior in setting of ambien use (may need to change this)  Enc her to disc alternatives with her psychiatrist , I do not think it is safe for her   Ref for PT Promises to use walker when unsteady

## 2022-02-16 NOTE — Assessment & Plan Note (Signed)
For f/u with psychiatry  Enc her to discuss Anita Harrell- may be adding to falls /interested in other options  Continues buspar 30 mg up to tid

## 2022-02-16 NOTE — Patient Instructions (Addendum)
Finish the zithromax and the prednisone   Please stop vaping   Follow up with your psychiatrist  May need a new plan for sleep  Talk about the ambien   Use a walker if needed  Try hard not to fall   I will put the referral in for PT  You can call them to schedule   If you feel unsteady use your walker !

## 2022-02-16 NOTE — Assessment & Plan Note (Signed)
Ongoing - chronic R sided with likely DDD Declines spine injections Rev notes from Dr Lorelei Pont  Taking tramadol chronically for this with caution 50-100 mg up to tid  Inst to watch for falls/dizziness and will monitor  Continues PT

## 2022-02-16 NOTE — Assessment & Plan Note (Signed)
bp in fair control at this time  BP Readings from Last 1 Encounters:  02/16/22 128/72   No changes needed Most recent labs reviewed  Disc lifstyle change with low sodium diet and exercise   Metoprolol xl 50 mg daily  Lasix 40 mg daily for this and CHF Flacainide for a fib

## 2022-02-16 NOTE — Progress Notes (Signed)
Subjective:    Patient ID: Anita Harrell, female    DOB: 05-11-1950, 71 y.o.   MRN: [id number]  HPI Pt presents for f/u hospitalization for copd exacerbation   Wt Readings from Last 3 Encounters:  02/16/22 207 lb 2 oz (94 kg)  02/15/22 204 lb 9.4 oz (92.8 kg)  02/09/22 200 lb (90.7 kg)   37.88 kg/m  Pulse ox 93%  BP Readings from Last 3 Encounters:  02/16/22 128/72  02/15/22 132/73  02/09/22 131/81   Pulse Readings from Last 3 Encounters:  02/16/22 (!) 106  02/15/22 89  02/09/22 99   Feeling better overall  She did fall again last night - fell off her scale (in middle of the night)  Lost her footing for RL leg   Has a cane and walker at home  Declines need for bedside commode    Hosp from 12/4 to 02/15/22  She presented with sob, cough and wheezing , and had a fall  Found to have pulse ox of 85 % on RA   Dx with copd exac and acute resp failure and also CHF  Tx with solumed and nebs  A/p Expand All Collapse All      Physician Discharge Summary    Patient: Anita Harrell MRN: [id number] DOB: 01-17-1951  Admit date:     02/13/2022  Discharge date: 02/15/22  Discharge Physician: Loletha Grayer    PCP: Abner Greenspan, MD    Recommendations at discharge:    Follow-up PCP 5 days   Discharge Diagnoses: Principal Problem:   Acute respiratory failure with hypoxia (HCC) Active Problems:   COPD exacerbation (HCC)   Acute on chronic diastolic CHF (congestive heart failure) (HCC)   HTN (hypertension)   Atrial fibrillation with RVR ([place])   Fall at home, initial encounter   Hyperlipidemia   Hypokalemia   Depression with anxiety   Obesity (BMI 30-39.9)       Hospital Course: 71 y.o. female with medical history significant of hypertension, hyperlipidemia, COPD not on oxygen at home, depression with anxiety, panic disorder, tobacco abuse, atrial fibrillation on Eliquis, possible undiagnosed CHF (patient is on Lasix), who presents with shortness  breath.   Patient states that she has shortness of breath for more than 3 days, which has been progressively worsening.  Patient has dry cough, wheezing, no chest pain, fever or chills.  Patient denies chest pain.  No nausea, vomiting, diarrhea or abdominal pain.  No symptoms of UTI. Patient states that she tripped her steps and fell on Saturday night.  No loss of consciousness.  No significant injury. Patient strongly denies any head or neck injury.  She refused CT scan of head and neck.  She has bruise over left forearm, but no left arm pain.  Patient has a bilateral lower leg edema.   Patient is using oxygen normally, but was found to have oxygen desaturation 85% on room air, which improved to 98% on 2 L oxygen.  Patient has difficulty speaking in full sentence.  Patient is in no acute respiratory distress.  Patient is very anxious.   12/5: Patient is improving but not yet at baseline.  Remains on 2 L nasal cannula.  Remains in rapid atrial fibrillation on Cardizem gtt.  Seems very anxious.   12/6.  I was called by nursing staff that the patient wanted to go home as soon as I came into the hospital.  I saw the patient and she was stable for discharge home.  Assessment and Plan: * Acute respiratory failure with hypoxia ([place]) Patient initially had a pulse ox of 85% on room air.  This problem has resolved and patient breathing comfortably on room air.   COPD exacerbation ([place]) Patient was treated with Solu-Medrol and nebulizer treatments.  Prescribed steroid inhaler and prednisone upon going home.  Patient has albuterol inhaler at home.   Acute on chronic diastolic CHF (congestive heart failure) (HCC) EF 50%.  Likely more COPD rather than CHF.  Refer to CHF clinic.  Continue Toprol and Lasix.   HTN (hypertension) Blood pressure stable on Toprol   Atrial fibrillation with RVR (HCC) Continue flecainide and Toprol for rate control and Eliquis for anticoagulation.   Hyperlipidemia Continue  Crestor   Hypokalemia Replaced during the hospital course   Obesity (BMI 30-39.9) BMI 37.42     Sepsis ruled out     Needs home health order for PT  Famotidine and sertraline were d/c  She was px azithromycin to take 2 more doses starting today  2 more days of prednisone   F/u planned with cardiology   Also rec note from her PT asking we include back pain in dx for her home PT  Chronic r sided low back pain wi/o sciatica - likely LS ddd  Per last visit with Dr Lorelei Pont  Chronic back pain is challenging, and I am doubtful she will ever be 100% pain-free.  We talked about possibly advanced imaging with an MRI and consideration of epidural steroid injection.  Or facet injection.  I think that would be reasonable, but the patient became somewhat nervous after this conversation, even though she initially was quite optimistic.   Right now, I am going to start her on some scheduled pain medicine.  If tramadol ultimately does not work, then the patient may benefit from longer term stronger narcotics such as Vicodin or Percocet.  I will need to communicate all this with the patient's primary care doctor, as well.  Still vaping a little  Down to 1 for nicotine   Patient Active Problem List   Diagnosis Date Noted   Chest pain 02/16/2022   General weakness 02/16/2022   Poor balance 02/16/2022   Falling 02/16/2022   COPD exacerbation ([place]) 02/13/2022   Fall at home, initial encounter 02/13/2022   Hypokalemia 02/13/2022   HTN (hypertension) 02/13/2022   Acute on chronic diastolic CHF (congestive heart failure) ([place]) 02/13/2022   DDD (degenerative disc disease), lumbar 12/26/2021   Bilateral hip bursitis 10/05/2021   Atrial fibrillation with RVR ([place]) 10/05/2021   Sleep walking 10/05/2021   Cerumen impaction 06/21/2021   Estrogen deficiency 06/07/2021   Overactive bladder 10/17/2019   Medicare annual wellness visit, subsequent 01/16/2019   Thyroid nodule 05/01/2018   Electronic  cigarette use 04/03/2018   Obesity (BMI 30-39.9) 04/03/2018   Lung mass 02/01/2018   Abnormal chest x-ray 01/29/2018   Smokers' cough ([place]) 01/01/2018   Tachycardia 11/01/2015   Microscopic hematuria 08/13/2012   Routine general medical examination at a health care facility 07/22/2012   Leukocytosis 03/30/2010   Prediabetes 02/21/2008   HYPERTENSION, BENIGN ESSENTIAL 01/22/2008   ALLERGIC RHINITIS 07/25/2007   Low back pain 06/26/2007   Personal history of goiter 07/27/2006   Hyperlipidemia 07/27/2006   PANIC DISORDER 07/27/2006   Former smoker 07/27/2006   Depression with anxiety 07/27/2006   Insomnia 07/27/2006   Personal history of colonic polyps 06/09/2004   Past Medical History:  Diagnosis Date   Allergy    allergic rhinitis  Anxiety    Back pain    Chest pain    Depression    Epigastric pain    Goiter    History of tobacco abuse    Hyperglycemia    mild   Hyperlipidemia    Hypertension    Insomnia    Hx of   Labile blood pressure    Panic disorder    History of   Personal history of colonic polyps 06/09/2004   SVD (spontaneous vaginal delivery)    x 2   Past Surgical History:  Procedure Laterality Date   ABDOMINAL HYSTERECTOMY N/A 09/24/2012   Procedure: HYSTERECTOMY ABDOMINAL;  Surgeon: Gus Height, MD;  Location: [facility];  Service: Gynecology;  Laterality: N/A;   BREAST BIOPSY     CARDIOVERSION N/A 02/09/2022   Procedure: CARDIOVERSION;  Surgeon: Minna Merritts, MD;  Location: [facility];  Service: Cardiovascular;  Laterality: N/A;   COLONOSCOPY     COLONOSCOPY  2017   ELBOW SURGERY  2004   EYE SURGERY     bilateral lasik   SALPINGOOPHORECTOMY Bilateral 09/24/2012   Procedure: SALPINGO OOPHORECTOMY;  Surgeon: Gus Height, MD;  Location: [facility];  Service: Gynecology;  Laterality: Bilateral;   THYROID SURGERY     B9 massess   WART FULGURATION N/A 09/24/2012   Procedure: FULGURATION VAGINAL WART;  Surgeon: Gus Height, MD;  Location: [facility];  Service:  Gynecology;  Laterality: N/A;   WISDOM TOOTH EXTRACTION     Social History   Tobacco Use   Smoking status: Former    Packs/day: 1.00    Years: 20.00    Total pack years: 20.00    Types: Cigarettes, E-cigarettes    Quit date: 12/11/2008    Years since quitting: 13.1   Smokeless tobacco: Never   Tobacco comments:    She is vaping   Vaping Use   Vaping Use: Every day  Substance Use Topics   Alcohol use: No    Alcohol/week: 0.0 standard drinks of alcohol   Drug use: No   Family History  Problem Relation Age of Onset   Hypertension Mother    Stroke Mother    Alcohol abuse Father    Cancer Father        lung CA   Heart disease Father        CHF   Cancer Sister        breast CA   Heart disease Sister        CHF from chemotx also has defib   Breast cancer Sister    Colon cancer Neg Hx    Esophageal cancer Neg Hx    Rectal cancer Neg Hx    Stomach cancer Neg Hx    Allergies  Allergen Reactions   Abilify [Aripiprazole]     Vision problem   Codeine Nausea And Vomiting    REACTION: Nausea and vomiting Pt has tolerated vicodin & percocet in the past   Cymbalta [Duloxetine Hcl]     More depressed/tearful    Lipitor [Atorvastatin]     Muscle pain    Wellbutrin [Bupropion]    Current Outpatient Medications on File Prior to Visit  Medication Sig Dispense Refill   acetaminophen (TYLENOL) 650 MG CR tablet Take 650 mg by mouth every 8 (eight) hours as needed for pain.     albuterol (VENTOLIN HFA) 108 (90 Base) MCG/ACT inhaler Inhale 2 puffs into the lungs every 6 (six) hours as needed for wheezing or shortness of breath. 8 g 2  apixaban (ELIQUIS) 5 MG TABS tablet TAKE 1 TABLET BY MOUTH TWICE A DAY 60 tablet 2   azithromycin (ZITHROMAX) 250 MG tablet One tab po daily for two more doses 2 each 0   budesonide-formoterol (SYMBICORT) 80-4.5 MCG/ACT inhaler Inhale 2 puffs into the lungs 2 (two) times daily. 1 each 0   busPIRone (BUSPAR) 30 MG tablet TAKE ONE HALF TO 1 TABLET BY  MOUTH 3 TIMES DAILY AS DIRECTED 270 tablet 1   cyclobenzaprine (FLEXERIL) 10 MG tablet Take 0.5-1 tablets (5-10 mg total) by mouth at bedtime as needed for muscle spasms. 30 tablet 3   flecainide (TAMBOCOR) 100 MG tablet Take 1 tablet (100 mg total) by mouth 2 (two) times daily. 180 tablet 3   furosemide (LASIX) 40 MG tablet Take 1 tablet (40 mg total) by mouth daily. 30 tablet 0   metoprolol succinate (TOPROL-XL) 50 MG 24 hr tablet TAKE 1 TABLET BY MOUTH TWICE A DAY WITH MEALS 180 tablet 1   potassium chloride 20 MEQ TBCR Take 20 mEq by mouth daily. 30 tablet 0   predniSONE (DELTASONE) 10 MG tablet Four tabs o daily for two more days 8 tablet 0   rosuvastatin (CRESTOR) 10 MG tablet TAKE 1 TABLET BY MOUTH ONCE A DAY 90 tablet 0   solifenacin (VESICARE) 10 MG tablet TAKE 1 TABLET BY MOUTH ONCE A DAY 90 tablet 1   traMADol (ULTRAM) 50 MG tablet Take 1-2 tablets (50-100 mg total) by mouth every 8 (eight) hours as needed for moderate pain. 50 tablet 2   zolpidem (AMBIEN CR) 12.5 MG CR tablet TAKE 1 TABLET BY MOUTH ONCE DAILY AT BEDTIME AS NEEDED FOR SLEEP 90 tablet 0   No current facility-administered medications on file prior to visit.    Review of Systems  Constitutional:  Negative for activity change, appetite change, fatigue, fever and unexpected weight change.  HENT:  Negative for congestion, ear pain, rhinorrhea, sinus pressure and sore throat.   Eyes:  Negative for pain, redness and visual disturbance.  Respiratory:  Positive for cough. Negative for shortness of breath and wheezing.        Breathing is fairly normal now   Cardiovascular:  Positive for leg swelling. Negative for chest pain and palpitations.       Intermittent brief chest pain   Gastrointestinal:  Negative for abdominal pain, blood in stool, constipation and diarrhea.  Endocrine: Negative for polydipsia and polyuria.  Genitourinary:  Negative for dysuria, frequency and urgency.  Musculoskeletal:  Negative for arthralgias,  back pain and myalgias.  Skin:  Negative for pallor and rash.  Allergic/Immunologic: Negative for environmental allergies.  Neurological:  Negative for dizziness, syncope and headaches.       Poor balance  Hematological:  Negative for adenopathy. Does not bruise/bleed easily.  Psychiatric/Behavioral:  Positive for dysphoric mood and sleep disturbance. Negative for decreased concentration. The patient is nervous/anxious.        Objective:   Physical Exam Constitutional:      General: She is not in acute distress.    Appearance: Normal appearance. She is well-developed. She is obese. She is not ill-appearing or diaphoretic.  HENT:     Head: Normocephalic and atraumatic.  Eyes:     Conjunctiva/sclera: Conjunctivae normal.     Pupils: Pupils are equal, round, and reactive to light.  Neck:     Thyroid: No thyromegaly.     Vascular: No carotid bruit or JVD.  Cardiovascular:     Rate and Rhythm: Tachycardia  present. Rhythm irregular.     Heart sounds: Normal heart sounds.     No gallop.  Pulmonary:     Effort: Pulmonary effort is normal. No respiratory distress.     Breath sounds: Normal breath sounds. No stridor. No wheezing, rhonchi or rales.     Comments: Diffusely distant bs  Abdominal:     General: There is no distension or abdominal bruit.     Palpations: Abdomen is soft.  Musculoskeletal:     Cervical back: Normal range of motion and neck supple.     Right lower leg: Edema present.     Left lower leg: Edema present.     Comments: Trace pedal edema   Lymphadenopathy:     Cervical: No cervical adenopathy.  Skin:    General: Skin is warm and dry.     Coloration: Skin is not pale.     Findings: No rash.  Neurological:     Mental Status: She is alert.     Coordination: Coordination normal.     Deep Tendon Reflexes: Reflexes are normal and symmetric. Reflexes normal.     Comments: No focal weakness  Needs help getting up from chair  Generally poor balance   Psychiatric:        Mood and Affect: Mood is anxious.           Assessment & Plan:   Problem List Items Addressed This Visit       Cardiovascular and Mediastinum   Acute on chronic diastolic CHF (congestive heart failure) ([place])    Recent hosp Reviewed hospital records, lab results and studies in detail  For cardiology f/u  Wt is up 3 lb from her hosp disc Continues furosemide 40 mg daily  No longer sob        Atrial fibrillation with RVR ([place])    This is intermittent HR is irreg today  Pt reports occ brief cp that may be worse with anxiety -inst to d/w cardiology Flecainide  Metoprolol Eliquis Under cardiology care       HTN (hypertension)   HYPERTENSION, BENIGN ESSENTIAL    bp in fair control at this time  BP Readings from Last 1 Encounters:  02/16/22 128/72  No changes needed Most recent labs reviewed  Disc lifstyle change with low sodium diet and exercise   Metoprolol xl 50 mg daily  Lasix 40 mg daily for this and CHF Flacainide for a fib        Respiratory   RESOLVED: Acute respiratory failure with hypoxia (HCC)    From flare of copd and CHF  Resolved now  Reviewed hospital records, lab results and studies in detail        COPD exacerbation ([place]) - Primary    Causing hypoxia and resp distress along with chf  Reviewed hospital records, lab results and studies in detail    Now much improved Finishing prednisone and zithromax Using symbicort Albut mdi prn  Reassuring exam Enc strongly to quit vaping now         Other   Depression with anxiety    For f/u with psychiatry  Enc her to discuss Lorrin Mais- may be adding to falls /interested in other options  Continues buspar 30 mg up to tid        Falling    Falling more  May be multifactorial  Some worrisome night time behavior in setting of ambien use (may need to change this)  Enc her to disc alternatives with her psychiatrist ,  I do not think it is safe for her   Ref for PT Promises  to use walker when unsteady        General weakness    S/p hosp for copd/chf She wants to do PT out of the house  Order placed for Rosita Kea  (also for back pain)      Relevant Orders   Ambulatory referral to Physical Therapy   Insomnia    From anxiety most likely  Chronic  Taking ambien  She has more odd night time behavior and falls  I enc her to consider stopping it  She would like to first disc opt for tx with her psychiatrist        Low back pain    Ongoing - chronic R sided with likely DDD Declines spine injections Rev notes from Dr Lorelei Pont  Taking tramadol chronically for this with caution 50-100 mg up to tid  Inst to watch for falls/dizziness and will monitor  Continues PT      Relevant Orders   Ambulatory referral to Physical Therapy   Poor balance    Incl this dx for her PT to work on it  Has had several falls      Relevant Orders   Ambulatory referral to Physical Therapy

## 2022-02-16 NOTE — Assessment & Plan Note (Signed)
From flare of copd and CHF  Resolved now  Reviewed hospital records, lab results and studies in detail

## 2022-02-16 NOTE — Assessment & Plan Note (Signed)
Recent hosp Reviewed hospital records, lab results and studies in detail  For cardiology f/u  Wt is up 3 lb from her hosp disc Continues furosemide 40 mg daily  No longer sob

## 2022-02-17 ENCOUNTER — Telehealth: Payer: Self-pay | Admitting: *Deleted

## 2022-02-17 NOTE — Patient Outreach (Signed)
  Care Coordination [facility] Note Transition Care Management Unsuccessful Follow-up Telephone Call  Date of discharge and from where:  [facility] [phone]  Attempts:  1st Attempt  Reason for unsuccessful TCM follow-up call:  Left voice message [place] [facility] Management [phone]

## 2022-02-17 NOTE — Patient Outreach (Signed)
  Care Coordination [facility] Note Transition Care Management Unsuccessful Follow-up Telephone Call  Date of discharge and from where:  [facility] [phone]  Attempts:  2nd Attempt  Reason for unsuccessful TCM follow-up call:  Left voice message  [place] [facility] Management [phone]

## 2022-02-20 ENCOUNTER — Ambulatory Visit: Payer: Medicare Other | Attending: Family Medicine | Admitting: Physical Therapy

## 2022-02-20 ENCOUNTER — Encounter: Payer: Self-pay | Admitting: Physical Therapy

## 2022-02-20 ENCOUNTER — Telehealth: Payer: Self-pay

## 2022-02-20 ENCOUNTER — Encounter: Payer: Medicare Other | Admitting: Physical Therapy

## 2022-02-20 VITALS — BP 110/70 | HR 85

## 2022-02-20 DIAGNOSIS — R2689 Other abnormalities of gait and mobility: Secondary | ICD-10-CM | POA: Insufficient documentation

## 2022-02-20 DIAGNOSIS — R262 Difficulty in walking, not elsewhere classified: Secondary | ICD-10-CM

## 2022-02-20 DIAGNOSIS — G8929 Other chronic pain: Secondary | ICD-10-CM | POA: Insufficient documentation

## 2022-02-20 DIAGNOSIS — Z9181 History of falling: Secondary | ICD-10-CM | POA: Diagnosis not present

## 2022-02-20 DIAGNOSIS — M5431 Sciatica, right side: Secondary | ICD-10-CM

## 2022-02-20 DIAGNOSIS — R2681 Unsteadiness on feet: Secondary | ICD-10-CM | POA: Diagnosis not present

## 2022-02-20 DIAGNOSIS — M5441 Lumbago with sciatica, right side: Secondary | ICD-10-CM | POA: Diagnosis not present

## 2022-02-20 DIAGNOSIS — R531 Weakness: Secondary | ICD-10-CM | POA: Insufficient documentation

## 2022-02-20 DIAGNOSIS — M5459 Other low back pain: Secondary | ICD-10-CM

## 2022-02-20 DIAGNOSIS — M6281 Muscle weakness (generalized): Secondary | ICD-10-CM | POA: Diagnosis not present

## 2022-02-20 NOTE — Progress Notes (Addendum)
Chronic Care Management Pharmacy Assistant   Name: TYJAI CHARBONNET  MRN: [id number] DOB: [birth year]  Reason for Encounter: Non-CCM [facility] Follow-Up)  Medications: Outpatient Encounter Medications as of 02/20/2022  Medication Sig   acetaminophen (TYLENOL) 650 MG CR tablet Take 650 mg by mouth every 8 (eight) hours as needed for pain.   albuterol (VENTOLIN HFA) 108 (90 Base) MCG/ACT inhaler Inhale 2 puffs into the lungs every 6 (six) hours as needed for wheezing or shortness of breath.   apixaban (ELIQUIS) 5 MG TABS tablet TAKE 1 TABLET BY MOUTH TWICE A DAY   azithromycin (ZITHROMAX) 250 MG tablet One tab po daily for two more doses   budesonide-formoterol (SYMBICORT) 80-4.5 MCG/ACT inhaler Inhale 2 puffs into the lungs 2 (two) times daily.   busPIRone (BUSPAR) 30 MG tablet TAKE ONE HALF TO 1 TABLET BY MOUTH 3 TIMES DAILY AS DIRECTED   cyclobenzaprine (FLEXERIL) 10 MG tablet Take 0.5-1 tablets (5-10 mg total) by mouth at bedtime as needed for muscle spasms.   flecainide (TAMBOCOR) 100 MG tablet Take 1 tablet (100 mg total) by mouth 2 (two) times daily.   furosemide (LASIX) 40 MG tablet Take 1 tablet (40 mg total) by mouth daily.   metoprolol succinate (TOPROL-XL) 50 MG 24 hr tablet TAKE 1 TABLET BY MOUTH TWICE A DAY WITH MEALS   potassium chloride 20 MEQ TBCR Take 20 mEq by mouth daily.   predniSONE (DELTASONE) 10 MG tablet Four tabs o daily for two more days   rosuvastatin (CRESTOR) 10 MG tablet TAKE 1 TABLET BY MOUTH ONCE A DAY   solifenacin (VESICARE) 10 MG tablet TAKE 1 TABLET BY MOUTH ONCE A DAY   traMADol (ULTRAM) 50 MG tablet Take 1-2 tablets (50-100 mg total) by mouth every 8 (eight) hours as needed for moderate pain.   zolpidem (AMBIEN CR) 12.5 MG CR tablet TAKE 1 TABLET BY MOUTH ONCE DAILY AT BEDTIME AS NEEDED FOR SLEEP   No facility-administered encounter medications on file as of 02/20/2022.     Reviewed hospital notes for details of recent visit. Has patient been  contacted by Transitions of Care team? Yes Has patient seen PCP/specialist for hospital follow up (summarize OV if yes): Yes 02/16/22 Loura Pardon, MD COPD Referral to Physical Therapy No changes  Admitted to the hospital on 02/13/2022. Discharge date was 02/15/2022.  Discharged from [facility]   Discharge diagnosis (Principal Problem): Acute respiratory failure with hypoxia  Patient was discharged to Home  Brief summary of hospital course: 71 y.o. female with medical history significant of hypertension, hyperlipidemia, COPD not on oxygen at home, depression with anxiety, panic disorder, tobacco abuse, atrial fibrillation on Eliquis, possible undiagnosed CHF (patient is on Lasix), who presents with shortness breath.   Patient states that she has shortness of breath for more than 3 days, which has been progressively worsening.  Patient has dry cough, wheezing, no chest pain, fever or chills.  Patient denies chest pain.  No nausea, vomiting, diarrhea or abdominal pain.  No symptoms of UTI. Patient states that she tripped her steps and fell on Saturday night.  No loss of consciousness.  No significant injury. Patient strongly denies any head or neck injury.  She refused CT scan of head and neck.  She has bruise over left forearm, but no left arm pain.  Patient has a bilateral lower leg edema.   Assessment and Plan: * Acute respiratory failure with hypoxia ([place]) Patient initially had a pulse ox of 85% on  room air.  This problem has resolved and patient breathing comfortably on room air.   COPD exacerbation ([place]) Patient was treated with Solu-Medrol and nebulizer treatments.  Prescribed steroid inhaler and prednisone upon going home.  Patient has albuterol inhaler at home.   Acute on chronic diastolic CHF (congestive heart failure) (HCC) EF 50%.  Likely more COPD rather than CHF.  Refer to CHF clinic.  Continue Toprol and Lasix.   New Medications Started at [facility]  Discharge:?? -Started azithromycin (ZITHROMAX) 250 MG tablet -Started budesonide-formoterol (SYMBICORT) 80-4.5 MCG/ACT inhaler -Started predniSONE (DELTASONE) 10 MG tablet    Medication Changes at Hospital Discharge: -Changed furosemide (LASIX) 40 MG tablet -Changed potassium chloride 20 MEQ TBCR   Medications Discontinued at Hospital Discharge: -Stopped famotidine 20 MG tablet (PEPCID) -Stopped sertraline 25 mg  Medications that remain the same after Hospital Discharge:??  -All other medications will remain the same.       Other upcoming appts: Ortho appointment on 02/20/2022 Cardiology appointment on 02/21/2022 Pulmonology appointment on 02/28/2022  Charlene Brooke, PharmD notified and will determine if action is needed.  Marijean Niemann, [place] Pharmacy Assistant [phone]    Pharmacist addendum: Patient has followed up with PCP, Cardiology, and Pulmonary. No PharmD intervention required.  Charlene Brooke, PharmD, BCACP 03/03/22 11:07 AM

## 2022-02-20 NOTE — Therapy (Signed)
OUTPATIENT PHYSICAL THERAPY EVALUATION   Patient Name: Anita Harrell MRN: [id number] DOB:1950/09/22, 71 y.o., female Today's Date: 02/20/2022  END OF SESSION:  PT End of Session - 02/20/22 1352     Visit Number 1    Number of Visits 24    Date for PT Re-Evaluation 05/15/22    Authorization Type MEDICARE PART B reporting period from 01/16/2022    Progress Note Due on Visit 10    PT Start Time 1120    PT Stop Time 1210    PT Time Calculation (min) 50 min    Activity Tolerance Patient tolerated treatment well    Behavior During Therapy [facility] for tasks assessed/performed             Past Medical History:  Diagnosis Date   Allergy    allergic rhinitis   Anxiety    Back pain    Chest pain    Depression    Epigastric pain    Goiter    History of tobacco abuse    Hyperglycemia    mild   Hyperlipidemia    Hypertension    Insomnia    Hx of   Labile blood pressure    Panic disorder    History of   Personal history of colonic polyps 06/09/2004   SVD (spontaneous vaginal delivery)    x 2   Past Surgical History:  Procedure Laterality Date   ABDOMINAL HYSTERECTOMY N/A 09/24/2012   Procedure: HYSTERECTOMY ABDOMINAL;  Surgeon: Gus Height, MD;  Location: [facility];  Service: Gynecology;  Laterality: N/A;   BREAST BIOPSY     CARDIOVERSION N/A 02/09/2022   Procedure: CARDIOVERSION;  Surgeon: Minna Merritts, MD;  Location: [facility];  Service: Cardiovascular;  Laterality: N/A;   COLONOSCOPY     COLONOSCOPY  2017   ELBOW SURGERY  2004   EYE SURGERY     bilateral lasik   SALPINGOOPHORECTOMY Bilateral 09/24/2012   Procedure: SALPINGO OOPHORECTOMY;  Surgeon: Gus Height, MD;  Location: [facility];  Service: Gynecology;  Laterality: Bilateral;   THYROID SURGERY     B9 massess   WART FULGURATION N/A 09/24/2012   Procedure: FULGURATION VAGINAL WART;  Surgeon: Gus Height, MD;  Location: [facility];  Service: Gynecology;  Laterality: N/A;   WISDOM TOOTH EXTRACTION     Patient Active  Problem List   Diagnosis Date Noted   Chest pain 02/16/2022   General weakness 02/16/2022   Poor balance 02/16/2022   Falling 02/16/2022   COPD exacerbation ([place]) 02/13/2022   Fall at home, initial encounter 02/13/2022   Hypokalemia 02/13/2022   HTN (hypertension) 02/13/2022   Acute on chronic diastolic CHF (congestive heart failure) ([place]) 02/13/2022   DDD (degenerative disc disease), lumbar 12/26/2021   Bilateral hip bursitis 10/05/2021   Atrial fibrillation with RVR ([place]) 10/05/2021   Sleep walking 10/05/2021   Cerumen impaction 06/21/2021   Estrogen deficiency 06/07/2021   Overactive bladder 10/17/2019   Medicare annual wellness visit, subsequent 01/16/2019   Thyroid nodule 05/01/2018   Electronic cigarette use 04/03/2018   Obesity (BMI 30-39.9) 04/03/2018   Lung mass 02/01/2018   Abnormal chest x-ray 01/29/2018   Smokers' cough ([place]) 01/01/2018   Tachycardia 11/01/2015   Microscopic hematuria 08/13/2012   Routine general medical examination at a health care facility 07/22/2012   Leukocytosis 03/30/2010   Prediabetes 02/21/2008   HYPERTENSION, BENIGN ESSENTIAL 01/22/2008   ALLERGIC RHINITIS 07/25/2007   Low back pain 06/26/2007   Personal history of goiter 07/27/2006  Hyperlipidemia 07/27/2006   PANIC DISORDER 07/27/2006   Former smoker 07/27/2006   Depression with anxiety 07/27/2006   Insomnia 07/27/2006   Personal history of colonic polyps 06/09/2004    PCP: Abner Greenspan, MD  REFERRING PROVIDER: Tower, Wynelle Fanny, MD  REFERRING DIAG: generalized weakness, chronic right-sided low back pain with right-sided sciatica, poor balance  THERAPY DIAG:  Muscle weakness (generalized)  Unsteadiness on feet  History of falling  Other low back pain  Sciatica, right side  Difficulty in walking, not elsewhere classified  Rationale for Evaluation and Treatment: Rehabilitation  ONSET DATE: chronic over many years, remembers original back pain starting when she was  approximately 71 years old, weakness and balance problems exacerbated since being hospitalized 02/13/2022.   SUBJECTIVE:   SUBJECTIVE STATEMENT: Patient is known to this clinic and PT. She was being seen by this PT for low back pain and was hospitalized a week ago for acute respiratory failure with severe sepsis 2/2 COPD exacerbation. PT in acute care recommended she have OP PT to help with balance and functional activities due to deconditioning whlie in the hospital. Patient saw her PCP last Friday who ordered new OP PT episode for diagnoses of generalized weakness, chronic right-sided low back pain with right-sided sciatica, and poor balance. Patient is now here for evaluation for these diagnoses. Patient arrives with no AD and states she is not walking very well and has to hold on to things to keep her balance around the home. She states her back did not bother her much while in the hospital when she was not up. It started hurting the day she came home and was pretty bad yesterday but is not as bad today.   PERTINENT HISTORY: Patient is a 71 y.o. female who presents to outpatient physical therapy with a referral for medical diagnosis lumbosacral degenerative disc disease, chronic back pain greater than 3 months duration. This patient's chief complaints consist of chronic bilateral low back pain (R > L) radiating to right glute/hip region leading to the following functional deficits: difficulty with "it all" including walking, standing, shopping, preparing meals, traveling, visiting her beach house, prolonged sitting, stairs, being active, transfers, bed mobility, keeping your balance and not falling. Relevant past medical history and comorbidities include afib (on eliquis), emphysema, anxiety, depression, goiter, HTN, insomnia, epigastric pain, lung mass in left lung (stable in 3 years), prediabetes, osteopenia, does endorse increased loose stool and gas recently, memory (self-described), COPD, CHF,   recent hospitalization.  Patient denies hx of cancer, stroke, seizures, diabetes, unexplained weight loss, unexplained stumbling or dropping things, osteoporosis, and spinal surgery   PAIN:  Are you having pain? Yes: NPRS scale: Current: 5/10,  Best: 0/10, Worst: 9/10. Pain location: right glute and low back Pain description:  "like a knife sticking in your back" Aggravating factors: standing, moving around, trying to cook supper, walking around, putting weight on right leg, riding in the car, going up and down steps, prolonged sitting.  Relieving factors: laying down, pain pills, muscle relaxers, ice, pushing buggy at store, extension exercises.   FUNCTIONAL LIMITATIONS: difficulty with "it all" including walking, standing, shopping, preparing meals, traveling, visiting her beach house, prolonged sitting, stairs, being active, transfers, bed mobility, keeping your balance and not falling.    PRECAUTIONS: None  WEIGHT BEARING RESTRICTIONS: No  FALLS:  Has patient fallen in last 6 months? Yes. Number of falls 4 (one the day she got home from the hospital).   LIVING ENVIRONMENT: Lives with: lives with  their spouse Lives in: House/apartment, 5 steps to get to the main house. Has bilateral handrails and can hold both.  Stairs: no Has following equipment at home: Single point cane, grab bars around tub, RW.  She takes baths in a tub/shower combo.   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   LEISURE: 2 dogs, used to be very active    PLOF: Independent  PATIENT GOALS: "to be out of pain" "I would love not to need the walker and the cane"   OBJECTIVE  DIAGNOSTIC FINDINGS:  R hip xray report 09/08/2020: CLINICAL DATA:  71 year old female with right lateral hip pain.   EXAM: DG HIP (WITH OR WITHOUT PELVIS) 2-3V RIGHT   COMPARISON:  None.   FINDINGS: There is no evidence of hip fracture or dislocation. There is no evidence of  arthropathy or other focal bone abnormality.   IMPRESSION: No acute fracture, malalignment, or significant degenerative change.     Electronically Signed   By: Ruthann Cancer MD   On: 09/08/2020 13:22  SELF- REPORTED FUNCTION FOTO score: 43/100 (balance questionnaire)  Vitals:   02/20/22 1139  BP: 110/70  Pulse: 85  SpO2: 97%   OBSERVATION/INSPECTION Posture Posture (seated): leans away from right side Posture (standing): slight left lateral shift, increased thoracic kyphosis, decreased lumbar lordosis.   Anthropometrics Tremor: none noted today Body composition: BMI 3.2 Muscle bulk: no gross asymmetry noted  Skin: bruising at left forearm and right wrist Functional Mobility Bed mobility: supine <> sit and rolling mod I for increased time/effort.  Transfers: sit <> stand I with use of B UE when available.  Gait: slow careful steps but able to ambulate household and short community distance without AD. Reports feeling unsteady. More detailed gait analysis to be performed at later date as appropriate.   SPINE MOTION  LUMBAR SPINE AROM *Indicates pain Flexion: fingers to distal third of shins, no pain  Extension: 25% no pain Side Flexion:   R WFL with mild right sided pain  L WFL with no pain Rotation:  R 25% mild R sided pain L 10% no pain  PERIPHERAL JOINT MOTION (in degrees) PASSIVE RANGE OF MOTION (PROM) B LE grossly WFL for basic functional activities except lacks hip extension bilaterally.   MUSCLE PERFORMANCE (MMT):  *Indicates pain 02/20/22 Date Date  Joint/Motion R/L R/L R/L  Hip     Flexion (L1, L2) 4/4 / /  Extension (knee flex) 4/4 / /  Abduction 3+/4 / /  Knee     Extension (L3) / / /  Flexion (S2) 4/3+ / /  Ankle/Foot     Dorsiflexion (L4) 4/4 / /  Great toe extension (L5) 4/4 / /  Eversion (S1) 4/4 / /  Plantarflexion (S1) 4/4 / /  Comments:  02/20/2022: unable to heel or toe walk effectively with B UE support. Barely able to clear table  with knee with hip extension in prone bilaterally.   FUNCTIONAL/BALANCE TESTS: Six Minute Walk Test (6MWT): 635 feet with RW, no back pain, limited by feeling out of breath, reports left arm discomfort.   Timed Up and GO: 10.5  seconds (average of 3 trials) from 17 inch chair with no UE support. Supervision provided for safety.  Trial 1: 11 Trial 2: 10 Trial 3: 10.5  TODAY'S TREATMENT:  Education on importance of using AD since she has history of falls and reports feeling unsteady.   PATIENT EDUCATION:  Education details:  Education on diagnosis, prognosis, POC, anatomy and physiology of current condition. Person educated: Patient Education method: Customer service manager Education comprehension: verbal cues required and needs further education  HOME EXERCISE PROGRAM: TBD  ASSESSMENT:  CLINICAL IMPRESSION: Patient is a 71 y.o. female referred to outpatient physical therapy with a medical diagnosis of generalized weakness, chronic right-sided low back pain with right-sided sciatica, poor balance who presents with signs and symptoms consistent with generalized weakness/deconditioning, unsteadiness on feet, elevated fall risk, and chronic low back pain with radiation towards R LE with recent hospitalization exacerbating fall risk and weakness. Patient's back pain has responded well to specific exercises for extension preference during episode of care for low back pain prior to hospitalization. Patient presents with significant pain, ROM, joint stiffness, balance, motor control, flexibility, knowledge, muscle performance (strength/power/endurance) and activity tolerance impairments that are limiting ability to complete her usual activities such as "it all" including walking, standing, shopping, preparing meals, traveling, visiting her beach house, prolonged sitting, stairs, being active, transfers, bed mobility, keeping your  balance and not falling without difficulty. Patient will benefit from skilled physical therapy intervention to address current body structure impairments and activity limitations to improve function and work towards goals set in current POC in order to return to prior level of function or maximal functional improvement.    OBJECTIVE IMPAIRMENTS: Abnormal gait, cardiopulmonary status limiting activity, decreased activity tolerance, decreased balance, decreased cognition, decreased coordination, decreased endurance, decreased knowledge of condition, decreased knowledge of use of DME, decreased mobility, difficulty walking, decreased ROM, decreased strength, decreased safety awareness, hypomobility, impaired perceived functional ability, impaired flexibility, improper body mechanics, postural dysfunction, obesity, and pain.   ACTIVITY LIMITATIONS: carrying, lifting, bending, standing, squatting, stairs, transfers, bed mobility, bathing, toileting, hygiene/grooming, and locomotion level  PARTICIPATION LIMITATIONS: meal prep, cleaning, laundry, interpersonal relationship, shopping, community activity, and   "it all" including walking, standing, shopping, preparing meals, traveling, visiting her beach house, prolonged sitting, stairs, being active, transfers, bed mobility, keeping your balance and not falling  PERSONAL FACTORS: Fitness, Past/current experiences, Time since onset of injury/illness/exacerbation, and 3+ comorbidities:   afib (on eliquis), emphysema, anxiety, depression, goiter, HTN, insomnia, epigastric pain, lung mass in left lung (stable in 3 years), prediabetes, osteopenia, does endorse increased loose stool and gas recently, memory (self-described), COPD, CHF,  recent hospitalization are also affecting patient's functional outcome.   REHAB POTENTIAL: Good  CLINICAL DECISION MAKING: Evolving/moderate complexity  EVALUATION COMPLEXITY: Moderate   GOALS: Goals reviewed with patient? No    SHORT TERM GOALS: Target date: 03/06/2022   Patient will be independent with initial home exercise program for self-management of symptoms. Baseline: Initial HEP to be provided at visit 2 as appropriate (02/20/2022); Goal status: INITIAL     LONG TERM GOALS: Target date: 05/15/2022    Patient will be independent with a long-term home exercise program for self-management of symptoms.  Baseline: Initial HEP to be provided at visit 2 as appropriate (02/20/2022); Goal status: INITIAL   2.  Patient will demonstrate improved FOTO to equal or greater than 51 by visit #13 to demonstrate improvement in overall condition and self-reported functional ability.  Baseline: 43 (02/20/2022); Goal status: INITIAL   3.  Patient will ambulate equal or greater than 1000 feet with LRAD to demonstrate improvement in community mobility.  Baseline: 635 feet with RW, no back pain, limited by feeling  out of breath, reports left arm discomfort. (02/20/2022); Goal status: INITIAL   4.  Patient will demonstrate bilateral hip abduction strength equal or greater than 4+/5 on MMT to improve strength for single leg stance activities such as walking and climbing stairs.  Baseline: R 3+/5, L 4/5 (02/20/2022); Goal status: INITIAL   5.  Patient will complete community, work and/or recreational activities with 50% less limitation due to current condition.  Baseline: difficulty with "it all" including walking, standing, shopping, preparing meals, traveling, visiting her beach house, prolonged sitting, stairs, being active, transfers, bed mobility, keeping your balance and not falling (02/20/2022); Goal status: INITIAL   6.  Patient will score equal or greater than 25/30 on Functional Gait Assessment to demonstrate low fall risk.  Baseline: to be tested visit 2 as appropriate (02/20/2022);  Goal status: INITIAL    PLAN:  PT FREQUENCY: 1-2x/week  PT DURATION: 12 weeks  PLANNED INTERVENTIONS: Therapeutic exercises,  Therapeutic activity, Neuromuscular re-education, Balance training, Gait training, Patient/Family education, Self Care, Joint mobilization, Stair training, Vestibular training, DME instructions, Dry Needling, Cognitive remediation, Electrical stimulation, Spinal mobilization, Cryotherapy, Moist heat, Manual therapy, and Re-evaluation  PLAN FOR NEXT SESSION: update HEP as appropriate, progressive LE/core/functional strengthening and static/dynamic balance exercises, specific exercises and manual therapy as needed for pain control.    Everlean Alstrom. Graylon Good, PT, DPT 02/20/22, 2:21 PM  [facility] [address] [place], [place] [address] P: [phone] I F: [phone]

## 2022-02-20 NOTE — Progress Notes (Unsigned)
Cardiology Office Note    Date:  02/21/2022   ID:  Anita, Harrell April 15, 1950, MRN [id number]  PCP:  Abner Greenspan, MD  Cardiologist:  [name] Rogue, MD  Electrophysiologist:  None   Chief Complaint: Hospital follow-up  History of Present Illness:   Anita Harrell is a 71 y.o. female with history of coronary calcification noted on CT imaging, persistent A-fib on apixaban, HFpEF, emphysema with prior tobacco and vaping with chronic dyspnea, HTN, and depression/anxiety who presents for hospital follow-up as outlined below.  She was evaluated as a new patient by Dr. Rockey Situ in 07/2021 for shortness of breath and new onset A-fib.  Echo in 08/2021 showed an EF of 55 to 60%, no regional wall motion abnormalities, normal RV systolic function and ventricular cavity size, moderately dilated left atrium, mild mitral regurgitation, and an estimated right atrial pressure of 3 mmHg.  ETT (for flecainide use) on 12/21/2021 showed the patient exercised for 1 minute and 34 seconds achieving 3.8 METS of activity with a normal BP response.  Overall, this was an intermediate risk treadmill stress test due to poor exercise tolerance.  There was no electrocardiographic evidence of ischemia.  Zio patch in 12/2021 showed a 100% A-fib burden with an average ventricular rate of 96 bpm.  She presented for scheduled DCCV on 02/09/2022 and was found to have converted to sinus rhythm spontaneously prior to the procedure.  She was admitted to the hospital from 12/4 through 12/6 with acute respiratory failure with hypoxia in the setting of COPD exacerbation and acute on chronic HFpEF.  High-sensitivity troponin negative x 2, BNP 128, D-dimer normal.  Echo showed an EF of 50 to 55%, no regional wall motion abnormalities, mild LVH, normal RV systolic function and ventricular cavity size, normal PASP, trivial mitral regurgitation, and an estimated right atrial pressure of 3 mmHg.  EKG showed sinus rhythm with a  first-degree AV block.  She did briefly require supplemental oxygen though was weaned to room air prior to discharge.  She was treated with steroids, nebulizer therapy, and diuresis with symptomatic improvement.  She comes in accompanied by her husband today and is doing well from a cardiac perspective.  She continues to improve following her recent admission and notes that today she has felt better.  She does feel like her breathing is close to baseline.  No chest pain, tachypalpitations, dizziness, presyncope, or syncope.  No further lower extremity swelling.  Her weight is down 8 pounds today when compared to her last visit in our office in 01/2022.  She remains adherent to apixaban twice daily and has not missed any doses.  No progressive orthopnea.   Labs independently reviewed: 02/2022 - potassium 3.3, BUN 8, serum creatinine 0.55, magnesium 1.9, Hgb 12.2, PLT 144 05/2021 - A1c 6.1, TSH normal, TC 118, TG 197, HDL 41, LDL 37, albumin 4.7, AST/ALT normal  Past Medical History:  Diagnosis Date   Allergy    allergic rhinitis   Anxiety    Back pain    Chest pain    Depression    Epigastric pain    Goiter    History of tobacco abuse    Hyperglycemia    mild   Hyperlipidemia    Hypertension    Insomnia    Hx of   Labile blood pressure    Panic disorder    History of   Personal history of colonic polyps 06/09/2004   SVD (spontaneous vaginal delivery)    x  2    Past Surgical History:  Procedure Laterality Date   ABDOMINAL HYSTERECTOMY N/A 09/24/2012   Procedure: HYSTERECTOMY ABDOMINAL;  Surgeon: Gus Height, MD;  Location: [facility];  Service: Gynecology;  Laterality: N/A;   BREAST BIOPSY     CARDIOVERSION N/A 02/09/2022   Procedure: CARDIOVERSION;  Surgeon: Minna Merritts, MD;  Location: [facility];  Service: Cardiovascular;  Laterality: N/A;   COLONOSCOPY     COLONOSCOPY  2017   ELBOW SURGERY  2004   EYE SURGERY     bilateral lasik   SALPINGOOPHORECTOMY Bilateral 09/24/2012    Procedure: SALPINGO OOPHORECTOMY;  Surgeon: Gus Height, MD;  Location: [facility];  Service: Gynecology;  Laterality: Bilateral;   THYROID SURGERY     B9 massess   WART FULGURATION N/A 09/24/2012   Procedure: FULGURATION VAGINAL WART;  Surgeon: Gus Height, MD;  Location: [facility];  Service: Gynecology;  Laterality: N/A;   WISDOM TOOTH EXTRACTION      Current Medications: Current Meds  Medication Sig   acetaminophen (TYLENOL) 650 MG CR tablet Take 650 mg by mouth every 8 (eight) hours as needed for pain.   albuterol (VENTOLIN HFA) 108 (90 Base) MCG/ACT inhaler Inhale 2 puffs into the lungs every 6 (six) hours as needed for wheezing or shortness of breath.   apixaban (ELIQUIS) 5 MG TABS tablet TAKE 1 TABLET BY MOUTH TWICE A DAY   azithromycin (ZITHROMAX) 250 MG tablet One tab po daily for two more doses   budesonide-formoterol (SYMBICORT) 80-4.5 MCG/ACT inhaler Inhale 2 puffs into the lungs 2 (two) times daily.   busPIRone (BUSPAR) 30 MG tablet TAKE ONE HALF TO 1 TABLET BY MOUTH 3 TIMES DAILY AS DIRECTED   cyclobenzaprine (FLEXERIL) 10 MG tablet Take 0.5-1 tablets (5-10 mg total) by mouth at bedtime as needed for muscle spasms.   diltiazem (CARDIZEM CD) 120 MG 24 hr capsule Take 1 capsule (120 mg total) by mouth daily.   flecainide (TAMBOCOR) 100 MG tablet Take 1 tablet (100 mg total) by mouth 2 (two) times daily.   furosemide (LASIX) 40 MG tablet Take 1 tablet (40 mg total) by mouth daily.   metoprolol succinate (TOPROL-XL) 50 MG 24 hr tablet TAKE 1 TABLET BY MOUTH TWICE A DAY WITH MEALS   potassium chloride 20 MEQ TBCR Take 20 mEq by mouth daily.   predniSONE (DELTASONE) 10 MG tablet Four tabs o daily for two more days   rosuvastatin (CRESTOR) 10 MG tablet TAKE 1 TABLET BY MOUTH ONCE A DAY   solifenacin (VESICARE) 10 MG tablet TAKE 1 TABLET BY MOUTH ONCE A DAY   traMADol (ULTRAM) 50 MG tablet Take 1-2 tablets (50-100 mg total) by mouth every 8 (eight) hours as needed for moderate pain.   zolpidem  (AMBIEN CR) 12.5 MG CR tablet TAKE 1 TABLET BY MOUTH ONCE DAILY AT BEDTIME AS NEEDED FOR SLEEP    Allergies:   Abilify [aripiprazole], Codeine, Cymbalta [duloxetine hcl], Lipitor [atorvastatin], and Wellbutrin [bupropion]   Social History   Socioeconomic History   Marital status: Married    Spouse name: Not on file   Number of children: Not on file   Years of education: Not on file   Highest education level: Not on file  Occupational History   Not on file  Tobacco Use   Smoking status: Every Day    Types: E-cigarettes   Smokeless tobacco: Never   Tobacco comments:    She is vaping, former smoker    Media planner  Vaping Use: Every day  Substance and Sexual Activity   Alcohol use: No    Alcohol/week: 0.0 standard drinks of alcohol   Drug use: No   Sexual activity: Yes    Birth control/protection: Post-menopausal  Other Topics Concern   Not on file  Social History Narrative   Lives with husband and 2 pets; dogs.   Social Determinants of Health   Financial Resource Strain: Low Risk  (05/16/2021)   Overall Financial Resource Strain (CARDIA)    Difficulty of Paying Living Expenses: Not hard at all  Food Insecurity: No Food Insecurity (02/14/2022)   Hunger Vital Sign    Worried About Running Out of Food in the Last Year: Never true    Ran Out of Food in the Last Year: Never true  Transportation Needs: No Transportation Needs (02/14/2022)   PRAPARE - Hydrologist (Medical): No    Lack of Transportation (Non-Medical): No  Physical Activity: Inactive (05/16/2021)   Exercise Vital Sign    Days of Exercise per Week: 0 days    Minutes of Exercise per Session: 0 min  Stress: No Stress Concern Present (05/16/2021)   Early    Feeling of Stress : Not at all  Social Connections: Moderately Isolated (05/16/2021)   Social Connection and Isolation Panel [NHANES]    Frequency of Communication  with Friends and Family: More than three times a week    Frequency of Social Gatherings with Friends and Family: Never    Attends Religious Services: Never    Marine scientist or Organizations: No    Attends Music therapist: Never    Marital Status: Married     Family History:  The patient's family history includes Alcohol abuse in her father; Breast cancer in her sister; Cancer in her father and sister; Heart disease in her father and sister; Hypertension in her mother; Stroke in her mother. There is no history of Colon cancer, Esophageal cancer, Rectal cancer, or Stomach cancer.  ROS:   12-point review of systems is negative unless otherwise noted in the HPI.   EKGs/Labs/Other Studies Reviewed:    Studies reviewed were summarized above. The additional studies were reviewed today:  2D echo 02/14/2022: 1. Left ventricular ejection fraction, by estimation, is 50 to 55%. The  left ventricle has low normal function. The left ventricle has no regional  wall motion abnormalities. There is mild left ventricular hypertrophy.  Left ventricular diastolic function   could not be evaluated.   2. Right ventricular systolic function is normal. The right ventricular  size is normal. There is normal pulmonary artery systolic pressure.   3. The mitral valve is normal in structure. Trivial mitral valve  regurgitation.   4. The aortic valve has an indeterminant number of cusps. Aortic valve  regurgitation is not visualized. No aortic stenosis is present.   5. The inferior vena cava is normal in size with greater than 50%  respiratory variability, suggesting right atrial pressure of 3 mmHg.  __________  Elwyn Reach patch 12/2021: Atrial Fibrillation occurred continuously (100% burden), ranging from 60-158 bpm (avg of 96 bpm).    Isolated VEs were rare (<1.0%), VE Couplets were rare (<1.0%), and no VE Triplets were present.    Patient triggered (1) associated with atrial  fibrillation __________  ETT 12/21/2021:   Patient exercised for 1 minute and 34 seconds with 3.8 METS of activity.  Normal blood pressure response.  No ST segment depression   Overall intermediate risk exercise treadmill test secondary to poor exercise tolerance.  There was no electrocardiographic evidence of ischemia. __________  2D echo 08/30/2021: 1. Left ventricular ejection fraction, by estimation, is 55 to 60%. The  left ventricle has normal function. The left ventricle has no regional  wall motion abnormalities. Left ventricular diastolic parameters are  indeterminate.   2. Right ventricular systolic function is normal. The right ventricular  size is normal. Tricuspid regurgitation signal is inadequate for assessing  PA pressure.   3. Left atrial size was moderately dilated.   4. The mitral valve is normal in structure. Mild mitral valve  regurgitation. No evidence of mitral stenosis.   5. The aortic valve is normal in structure. Aortic valve regurgitation is  not visualized. No aortic stenosis is present.   6. The inferior vena cava is normal in size with greater than 50%  respiratory variability, suggesting right atrial pressure of 3 mmHg.     EKG:  EKG is ordered today.  The EKG ordered today demonstrates A-fib with RVR, 102 bpm, poor R wave progression along the precordial leads, nonspecific ST-T changes  Recent Labs: 06/07/2021: ALT 17; TSH 2.14 02/13/2022: B Natriuretic Peptide 128.9 02/14/2022: BUN 8; Creatinine, Ser 0.55; Magnesium 1.9; Potassium 3.3; Sodium 140 02/21/2022: Hemoglobin 15.3; Platelets 150  Recent Lipid Panel    Component Value Date/Time   CHOL 118 06/07/2021 1500   TRIG 197.0 (H) 06/07/2021 1500   HDL 41.30 06/07/2021 1500   CHOLHDL 3 06/07/2021 1500   VLDL 39.4 06/07/2021 1500   LDLCALC 37 06/07/2021 1500   LDLDIRECT 73.0 08/21/2014 1157    PHYSICAL EXAM:    VS:  BP 118/82 (BP Location: Left Arm, Patient Position: Sitting, Cuff Size:  Large)   Pulse (!) 102   Ht 5' 2.5" (1.588 m)   Wt 201 lb 3.2 oz (91.3 kg)   SpO2 96%   BMI 36.21 kg/m   BMI: Body mass index is 36.21 kg/m.  Physical Exam Vitals reviewed.  Constitutional:      Appearance: She is well-developed.  HENT:     Head: Normocephalic and atraumatic.  Eyes:     General:        Right eye: No discharge.        Left eye: No discharge.  Neck:     Vascular: No JVD.  Cardiovascular:     Rate and Rhythm: Normal rate. Rhythm irregularly irregular.     Pulses:          Posterior tibial pulses are 2+ on the right side and 2+ on the left side.     Heart sounds: Normal heart sounds, S1 normal and S2 normal. Heart sounds not distant. No midsystolic click and no opening snap. No murmur heard.    No friction rub.  Pulmonary:     Effort: Pulmonary effort is normal. No respiratory distress.     Breath sounds: Normal breath sounds. No decreased breath sounds, wheezing or rales.  Chest:     Chest wall: No tenderness.  Abdominal:     General: There is no distension.  Musculoskeletal:     Cervical back: Normal range of motion.     Right lower leg: No edema.     Left lower leg: No edema.  Skin:    General: Skin is warm and dry.     Nails: There is no clubbing.  Neurological:     Mental Status: She is alert and oriented to  person, place, and time.  Psychiatric:        Speech: Speech normal.        Behavior: Behavior normal.        Thought Content: Thought content normal.        Judgment: Judgment normal.     Wt Readings from Last 3 Encounters:  02/21/22 201 lb 3.2 oz (91.3 kg)  02/16/22 207 lb 2 oz (94 kg)  02/15/22 204 lb 9.4 oz (92.8 kg)     ASSESSMENT & PLAN:   Persistent A-fib: She is noted to be back in A-fib today, though is completely asymptomatic.  Add Cardizem CD 120 mg daily with continuation of Toprol-XL 50 mg twice daily and flecainide 100 mg twice daily.  Recent flecainide treadmill showed no significant arrhythmias at maximal exercise  tolerance.  Schedule DCCV.  Should she have further recurrence of A-fib after restoring sinus rhythm, we may need to consider alternative AAD.  CHADS2VASc at least 5 (CHF, HTN, age x 1, vascular disease, sex category).  She remains on apixaban 5 mg twice daily and does not meet reduced dosing criteria.  No falls since hospital discharge.  No bleeding concerns.  She remains adherent to apixaban twice daily and denies missing any doses.  Recent renal function and hemoglobin stable.  Check CBC and BMP.  HFpEF: She appears euvolemic and well compensated on furosemide 40 mg daily with KCl repletion.  Continue optimal blood pressure control.  Pursue rhythm control strategy as outlined above with regards to her A-fib.  In follow-up, consider addition of MRA and/or SGLT2 inhibitor.  Check BMP.  HTN: Blood pressure is well-controlled in the office today.  She remains on Toprol-XL.  Emphysema with prior tobacco use and ongoing vape use: Noted on prior imaging.  Contributing to symptoms and hospitalization.  Complete cessation of vaping is recommended.  Follow-up with PCP.  Hypokalemia: Currently on KCl 20 mEq daily.  Repeat BMP with recommendation to replete potassium to goal 4.0 as indicated.  Falls/generalized weakness: No falls since hospital discharge.  Working with PT.   Shared Decision Making/Informed Consent{  The risks (stroke, cardiac arrhythmias rarely resulting in the need for a temporary or permanent pacemaker, skin irritation or burns and complications associated with conscious sedation including aspiration, arrhythmia, respiratory failure and death), benefits (restoration of normal sinus rhythm) and alternatives of a direct current cardioversion were explained in detail to Ms. Cahue and she agrees to proceed.       Disposition: F/u with Dr. Rockey Situ or an APP in 1 week after DCCV.   Medication Adjustments/Labs and Tests Ordered: Current medicines are reviewed at length with the patient  today.  Concerns regarding medicines are outlined above. Medication changes, Labs and Tests ordered today are summarized above and listed in the Patient Instructions accessible in Encounters.   Signed, Christell Faith, PA-C 02/21/2022 12:12 PM     [facility] [address] [place], [place] [address] [phone]

## 2022-02-21 ENCOUNTER — Telehealth: Payer: Self-pay | Admitting: *Deleted

## 2022-02-21 ENCOUNTER — Encounter: Payer: Self-pay | Admitting: Physician Assistant

## 2022-02-21 ENCOUNTER — Ambulatory Visit: Payer: Medicare Other | Attending: Physician Assistant | Admitting: Physician Assistant

## 2022-02-21 ENCOUNTER — Other Ambulatory Visit
Admission: RE | Admit: 2022-02-21 | Discharge: 2022-02-21 | Disposition: A | Discharge status: Home / Self Care | Payer: Medicare Other | Source: Ambulatory Visit | Attending: Physician Assistant | Admitting: Physician Assistant

## 2022-02-21 ENCOUNTER — Encounter: Payer: Medicare Other | Admitting: Physical Therapy

## 2022-02-21 VITALS — BP 118/82 | HR 102 | Ht 62.5 in | Wt 201.2 lb

## 2022-02-21 DIAGNOSIS — I10 Essential (primary) hypertension: Secondary | ICD-10-CM | POA: Insufficient documentation

## 2022-02-21 DIAGNOSIS — J439 Emphysema, unspecified: Secondary | ICD-10-CM | POA: Diagnosis not present

## 2022-02-21 DIAGNOSIS — I4819 Other persistent atrial fibrillation: Secondary | ICD-10-CM

## 2022-02-21 DIAGNOSIS — I5032 Chronic diastolic (congestive) heart failure: Secondary | ICD-10-CM

## 2022-02-21 DIAGNOSIS — R531 Weakness: Secondary | ICD-10-CM | POA: Diagnosis not present

## 2022-02-21 DIAGNOSIS — E876 Hypokalemia: Secondary | ICD-10-CM

## 2022-02-21 DIAGNOSIS — T8182XA Emphysema (subcutaneous) resulting from a procedure, initial encounter: Secondary | ICD-10-CM

## 2022-02-21 DIAGNOSIS — W19XXXA Unspecified fall, initial encounter: Secondary | ICD-10-CM

## 2022-02-21 LAB — CBC
HCT: 45.2 % (ref 36.0–46.0)
Hemoglobin: 15.3 g/dL — ABNORMAL HIGH (ref 12.0–15.0)
MCH: 29.4 pg (ref 26.0–34.0)
MCHC: 33.8 g/dL (ref 30.0–36.0)
MCV: 86.9 fL (ref 80.0–100.0)
Platelets: 150 10*3/uL (ref 150–400)
RBC: 5.2 MIL/uL — ABNORMAL HIGH (ref 3.87–5.11)
RDW: 13.5 % (ref 11.5–15.5)
WBC: 17.5 10*3/uL — ABNORMAL HIGH (ref 4.0–10.5)
nRBC: 0 % (ref 0.0–0.2)

## 2022-02-21 LAB — BASIC METABOLIC PANEL
Anion gap: 8 (ref 5–15)
BUN: 15 mg/dL (ref 8–23)
CO2: 23 mmol/L (ref 22–32)
Calcium: 9.4 mg/dL (ref 8.9–10.3)
Chloride: 107 mmol/L (ref 98–111)
Creatinine, Ser: 0.8 mg/dL (ref 0.44–1.00)
GFR, Estimated: >60 mL/min (ref >60)
Glucose, Bld: 132 mg/dL — ABNORMAL HIGH (ref 70–99)
Potassium: 3.4 mmol/L — ABNORMAL LOW (ref 3.5–5.1)
Sodium: 138 mmol/L (ref 135–145)

## 2022-02-21 MED ORDER — DILTIAZEM HCL ER COATED BEADS 120 MG PO CP24: 120.0000 mg | ORAL_CAPSULE | Freq: Every day | ORAL | 3 refills | Status: DC

## 2022-02-21 NOTE — Patient Instructions (Signed)
Medication Instructions:  Your physician has recommended you make the following change in your medication:   START Diltiazem CD 120 mg once daily   *If you need a refill on your cardiac medications before your next appointment, please call your pharmacy*   Lab Work: [place] today over at the [facility]. Stop at registration desk to check in.   If you have labs (blood work) drawn today and your tests are completely normal, you will receive your results only by: [place] (if you have MyChart) OR A paper copy in the mail If you have any lab test that is abnormal or we need to change your treatment, we will call you to review the results.   Testing/Procedures: You are scheduled for a Cardioversion on 03/02/22 with Dr. Rockey Situ Please arrive at the [facility] at 06:30 a.m. on the day of your procedure.  DIET INSTRUCTIONS:  Nothing to eat or drink after midnight except your medications with a small sip of water.         Labs: CBC & BMET today over at the [facility].   Medications: HOLD Furosemide that morning and you may take once you get back home. YOU MAY TAKE ALL of your remaining medications with a small amount of water.  Must have a responsible person to drive you home.  Bring a current list of your medications and current insurance cards.    If you have any questions after you get home, please call the office at [phone]    Follow-Up: At [facility], you and your health needs are our priority.  As part of our continuing mission to provide you with exceptional heart care, we have created designated Provider Care Teams.  These Care Teams include your primary Cardiologist (physician) and Advanced Practice Providers (APPs -  Physician Assistants and Nurse Practitioners) who all work together to provide you with the care you need, when you need it.   Your next appointment:   1 week(s) after cardioversion.  The format for your next  appointment:   In Person  Provider:   [name] Rogue, MD or Christell Faith, PA-C         Important Information About Sugar

## 2022-02-21 NOTE — Telephone Encounter (Signed)
-----   Message from Rise Mu, PA-C sent at 02/21/2022 12:40 PM EST ----- Pre-DCCV labs Potassium remains low and below goal of 4.0 Renal function ok, though starting to show some degree of dehydration  Random glucose mildly elevated WBC count elevated  Recommendations: -Decrease Lasix to 20 mg daily -Increase KCl to 20 mEq bid for 3 days, then decrease to 10 mEq daily thereafter -Follow up with PCP for intermittent and current leukocytosis and erythrocytosis (likely some degree of hemoconcentration when looking at renal function and PLT count as well) -Recheck BMP at follow up this month

## 2022-02-21 NOTE — Telephone Encounter (Signed)
Line with busy noise.

## 2022-02-22 ENCOUNTER — Telehealth: Payer: Self-pay | Admitting: *Deleted

## 2022-02-22 ENCOUNTER — Inpatient Hospital Stay: Payer: Medicare Other | Admitting: Family Medicine

## 2022-02-22 DIAGNOSIS — F411 Generalized anxiety disorder: Secondary | ICD-10-CM | POA: Diagnosis not present

## 2022-02-22 DIAGNOSIS — F332 Major depressive disorder, recurrent severe without psychotic features: Secondary | ICD-10-CM | POA: Diagnosis not present

## 2022-02-22 DIAGNOSIS — F5101 Primary insomnia: Secondary | ICD-10-CM | POA: Diagnosis not present

## 2022-02-22 NOTE — Patient Outreach (Signed)
  Care Coordination [facility] Note Transition Care Management Unsuccessful Follow-up Telephone Call  Date of discharge and from where:  [facility] [phone]  Attempts:  3rd Attempt  Reason for unsuccessful TCM follow-up call:  No answer/busy  [place] Management [phone]

## 2022-02-23 ENCOUNTER — Ambulatory Visit: Payer: Medicare Other | Attending: Pulmonary Disease

## 2022-02-23 ENCOUNTER — Ambulatory Visit: Payer: Medicare Other | Admitting: Physical Therapy

## 2022-02-23 ENCOUNTER — Encounter: Payer: Medicare Other | Admitting: Physical Therapy

## 2022-02-23 DIAGNOSIS — R0602 Shortness of breath: Secondary | ICD-10-CM | POA: Insufficient documentation

## 2022-02-23 LAB — PULMONARY FUNCTION TEST ARMC ONLY
DL/VA % pred: 84 %
DL/VA: 3.47 ml/min/mmHg/L
DLCO unc % pred: 72 %
DLCO unc: 14.47 ml/min/mmHg
FEF 25-75 Post: 3.08 L/sec
FEF 25-75 Pre: 1.99 L/sec
FEF2575-%Change-Post: 55 %
FEF2575-%Pred-Post: 165 %
FEF2575-%Pred-Pre: 106 %
FEV1-%Change-Post: 11 %
FEV1-%Pred-Post: 80 %
FEV1-%Pred-Pre: 71 %
FEV1-Post: 1.84 L
FEV1-Pre: 1.66 L
FEV1FVC-%Change-Post: 7 %
FEV1FVC-%Pred-Pre: 109 %
FEV6-%Change-Post: 3 %
FEV6-%Pred-Post: 70 %
FEV6-%Pred-Pre: 68 %
FEV6-Post: 2.06 L
FEV6-Pre: 1.99 L
FEV6FVC-%Pred-Post: 104 %
FEV6FVC-%Pred-Pre: 104 %
FVC-%Change-Post: 3 %
FVC-%Pred-Post: 67 %
FVC-%Pred-Pre: 65 %
FVC-Post: 2.06 L
FVC-Pre: 1.99 L
Post FEV1/FVC ratio: 89 %
Post FEV6/FVC ratio: 100 %
Pre FEV1/FVC ratio: 83 %
Pre FEV6/FVC Ratio: 100 %
RV % pred: 77 %
RV: 1.76 L
TLC % pred: 78 %
TLC: 4.09 L

## 2022-02-23 MED ORDER — ALBUTEROL SULFATE (2.5 MG/3ML) 0.083% IN NEBU
2.5000 mg | INHALATION_SOLUTION | Freq: Once | RESPIRATORY_TRACT | Status: AC
  Administered 2022-02-23: 2.5 mg via RESPIRATORY_TRACT

## 2022-02-23 NOTE — Progress Notes (Signed)
Patient ID: Anita Harrell, female    DOB: 11-Sep-1950, 71 y.o.   MRN: [id number]  HPI  Anita Harrell is a 71 y/o female with a history of atrial fibrillation, hyperlipidemia, HTN, thyroid disease, anxiety, COPD, depression, panic attacks and chronic heart failure.   Echo report from 02/14/22 showed an EF of 50-55% along with mild LVH  Stress test 12/21/21 was normal.   Admitted 02/13/22 due to shortness of breath along with dry cough, wheezing, no chest pain, fever or chills. Placed on oxygen due to hypoxia but then weaned off oxygen. Discharged after 2 days.   She presents today for her initial visit with a chief complaint of minimal fatigue upon moderate exertion. Describes this as chronic in nature. She has associated shortness of breath, wheezing, easy bruising, back pain and anxiety along with this. She denies any difficulty sleeping (although last night she didn't sleep well), dizziness, abdominal distention, palpitations, pedal edema, chest pain, cough or weight gain.   She is not adding any salt to her food and says that she's lost her taste for some foods, like [employer], that she used to love eating. Has been using salt substitute and just recently got Mrs[employer] Dash seasoning but hasn't started using it yet.   Is scheduled for cardioversion next week.   Past Medical History:  Diagnosis Date   Allergy    allergic rhinitis   Anxiety    Arrhythmia    atrial fibrillation   Back pain    Chest pain    CHF (congestive heart failure) (HCC)    COPD (chronic obstructive pulmonary disease) (HCC)    Depression    Epigastric pain    Goiter    History of tobacco abuse    Hyperglycemia    mild   Hyperlipidemia    Hypertension    Insomnia    Hx of   Labile blood pressure    Panic disorder    History of   Personal history of colonic polyps 06/09/2004   SVD (spontaneous vaginal delivery)    x 2   Past Surgical History:  Procedure Laterality Date   ABDOMINAL HYSTERECTOMY N/A 09/24/2012    Procedure: HYSTERECTOMY ABDOMINAL;  Surgeon: Gus Height, MD;  Location: [facility];  Service: Gynecology;  Laterality: N/A;   BREAST BIOPSY     CARDIOVERSION N/A 02/09/2022   Procedure: CARDIOVERSION;  Surgeon: Minna Merritts, MD;  Location: [facility];  Service: Cardiovascular;  Laterality: N/A;   COLONOSCOPY     COLONOSCOPY  2017   ELBOW SURGERY  2004   EYE SURGERY     bilateral lasik   SALPINGOOPHORECTOMY Bilateral 09/24/2012   Procedure: SALPINGO OOPHORECTOMY;  Surgeon: Gus Height, MD;  Location: [facility];  Service: Gynecology;  Laterality: Bilateral;   THYROID SURGERY     B9 massess   WART FULGURATION N/A 09/24/2012   Procedure: FULGURATION VAGINAL WART;  Surgeon: Gus Height, MD;  Location: [facility];  Service: Gynecology;  Laterality: N/A;   WISDOM TOOTH EXTRACTION     Family History  Problem Relation Age of Onset   Hypertension Mother    Stroke Mother    Alcohol abuse Father    Cancer Father        lung CA   Heart disease Father        CHF   Cancer Sister        breast CA   Heart disease Sister        CHF from chemotx also has defib  Breast cancer Sister    Colon cancer Neg Hx    Esophageal cancer Neg Hx    Rectal cancer Neg Hx    Stomach cancer Neg Hx    Social History   Tobacco Use   Smoking status: Former    Types: E-cigarettes   Smokeless tobacco: Never   Tobacco comments:    She is vaping, former smoker    Substance Use Topics   Alcohol use: No    Alcohol/week: 0.0 standard drinks of alcohol   Allergies  Allergen Reactions   Abilify [Aripiprazole]     Vision problem   Codeine Nausea And Vomiting    REACTION: Nausea and vomiting Pt has tolerated vicodin & percocet in the past   Cymbalta [Duloxetine Hcl]     More depressed/tearful    Lipitor [Atorvastatin]     Muscle pain    Wellbutrin [Bupropion]    Prior to Admission medications   Medication Sig Start Date End Date Taking? Authorizing Provider  albuterol (VENTOLIN HFA) 108 (90 Base) MCG/ACT inhaler  Inhale 2 puffs into the lungs every 6 (six) hours as needed for wheezing or shortness of breath. 02/15/22  Yes Wieting, Richard, MD  apixaban (ELIQUIS) 5 MG TABS tablet TAKE 1 TABLET BY MOUTH TWICE A DAY 01/10/22  Yes Gollan, Kathlene November, MD  budesonide-formoterol (SYMBICORT) 80-4.5 MCG/ACT inhaler Inhale 2 puffs into the lungs 2 (two) times daily. 02/15/22  Yes Wieting, Richard, MD  busPIRone (BUSPAR) 30 MG tablet TAKE ONE HALF TO 1 TABLET BY MOUTH 3 TIMES DAILY AS DIRECTED 11/04/21  Yes Tower, Wynelle Fanny, MD  cyclobenzaprine (FLEXERIL) 10 MG tablet Take 10 mg by mouth 3 (three) times daily as needed for muscle spasms. 0.5-1 tablets   Yes [provider]  diltiazem (CARDIZEM CD) 120 MG 24 hr capsule Take 1 capsule (120 mg total) by mouth daily. 02/21/22 05/22/22 Yes Dunn, Areta Haber, PA-C  flecainide (TAMBOCOR) 100 MG tablet Take 1 tablet (100 mg total) by mouth 2 (two) times daily. 12/12/21  Yes Gollan, Kathlene November, MD  furosemide (LASIX) 40 MG tablet Take 1 tablet (40 mg total) by mouth daily. 02/15/22 03/17/22 Yes Wieting, Richard, MD  metoprolol succinate (TOPROL-XL) 50 MG 24 hr tablet TAKE 1 TABLET BY MOUTH TWICE A DAY WITH MEALS 11/04/21  Yes Gollan, Kathlene November, MD  potassium chloride 20 MEQ TBCR Take 20 mEq by mouth daily. 02/15/22 05/16/22 Yes Wieting, Richard, MD  rosuvastatin (CRESTOR) 10 MG tablet TAKE 1 TABLET BY MOUTH ONCE A DAY 12/30/21  Yes Tower, Wynelle Fanny, MD  traMADol (ULTRAM) 50 MG tablet Take 1-2 tablets (50-100 mg total) by mouth every 8 (eight) hours as needed for moderate pain. 01/26/22  Yes Copland, Frederico Hamman, MD  zolpidem (AMBIEN CR) 12.5 MG CR tablet TAKE 1 TABLET BY MOUTH ONCE DAILY AT BEDTIME AS NEEDED FOR SLEEP 12/19/21  Yes Tower, Wynelle Fanny, MD  solifenacin (VESICARE) 10 MG tablet TAKE 1 TABLET BY MOUTH ONCE A DAY Patient not taking: Reported on 02/24/2022 12/08/21   Tower, Wynelle Fanny, MD   Review of Systems  Constitutional:  Positive for fatigue. Negative for appetite change.  HENT:  Negative  for congestion, postnasal drip and sore throat.   Eyes: Negative.   Respiratory:  Positive for shortness of breath and wheezing. Negative for cough and chest tightness.   Cardiovascular:  Negative for chest pain, palpitations and leg swelling.  Gastrointestinal:  Negative for abdominal distention and abdominal pain.  Endocrine: Negative.   Genitourinary: Negative.  Musculoskeletal:  Positive for back pain. Negative for neck pain.  Skin: Negative.   Allergic/Immunologic: Negative.   Neurological:  Negative for dizziness and light-headedness.  Hematological:  Negative for adenopathy. Bruises/bleeds easily.  Psychiatric/Behavioral:  Negative for dysphoric mood and sleep disturbance (sleeping on 2 pillow). The patient is nervous/anxious.     Vitals:   02/24/22 0954  BP: 139/86  Pulse: 93  Resp: 16  SpO2: 95%  Weight: 203 lb 8 oz (92.3 kg)   Wt Readings from Last 3 Encounters:  02/24/22 203 lb 8 oz (92.3 kg)  02/21/22 201 lb 3.2 oz (91.3 kg)  02/16/22 207 lb 2 oz (94 kg)   Lab Results  Component Value Date   CREATININE 0.80 02/21/2022   CREATININE 0.55 02/14/2022   CREATININE 0.61 02/13/2022   Physical Exam Vitals and nursing note reviewed. Exam conducted with a chaperone present (husband).  Constitutional:      Appearance: Normal appearance.  HENT:     Head: Normocephalic and atraumatic.  Cardiovascular:     Rate and Rhythm: Normal rate. Rhythm irregular.  Pulmonary:     Effort: Pulmonary effort is normal.     Breath sounds: No wheezing, rhonchi or rales.  Abdominal:     General: There is no distension.     Palpations: Abdomen is soft.     Tenderness: There is no abdominal tenderness.  Musculoskeletal:        General: No tenderness.     Cervical back: Normal range of motion and neck supple.     Right lower leg: No edema.     Left lower leg: No edema.  Skin:    General: Skin is warm and dry.  Neurological:     General: No focal deficit present.     Mental Status:  She is alert and oriented to person, place, and time.  Psychiatric:        Mood and Affect: Mood normal.        Behavior: Behavior normal.        Thought Content: Thought content normal.   Assessment & Plan:  1: Chronic heart failure with preserved ejection fraction with structural changes (LVH)- - NYHA class II - euvolemic today - weighing daily; understands to call for an overnight weight gain of > 2 pounds or a weekly weight gain of > 5 pounds - not adding salt and is using salt substitute and recently got Mrs. Dash to use - got [employer] last night but says that she didn't eat much because she lost her taste for it; reviewed keeping daily intake to '2000mg'$  - discussed adding SGLT2/ entresto at next visit but will defer to after her cardioversion next week - BNP 02/13/22 was 128.9  2: HTN- - BP mildly elevated (139/86) - saw PCP (Tower) 02/16/22 - BMP 02/21/22 reviewed and showed sodium 138, potassium 3.4, creatinine 0.8 & GFR >60 - relayed info to her from cardiology about med changes due to potassium of 3.4 with upcoming cardioversion; decrease lasix to '20mg'$  daily, increase potassium to 41mq BID X 3 days & then reduce potassium to 139m  3: Atrial fibrillation- - saw cardiology (Dunn) 02/21/22 - scheduled for cardioversion 03/02/22  4: COPD- - saw pulmonology (GPatsey Berthold9/19/23 - reports having recent "breathing test"   Med list reviewed.   Return in 6 weeks, sooner if needed.

## 2022-02-24 ENCOUNTER — Ambulatory Visit: Payer: Medicare Other | Attending: Family | Admitting: Family

## 2022-02-24 ENCOUNTER — Encounter: Payer: Self-pay | Admitting: Family

## 2022-02-24 VITALS — BP 139/86 | HR 93 | Resp 16 | Wt 203.5 lb

## 2022-02-24 DIAGNOSIS — I4819 Other persistent atrial fibrillation: Secondary | ICD-10-CM | POA: Diagnosis not present

## 2022-02-24 DIAGNOSIS — J449 Chronic obstructive pulmonary disease, unspecified: Secondary | ICD-10-CM | POA: Insufficient documentation

## 2022-02-24 DIAGNOSIS — Z79899 Other long term (current) drug therapy: Secondary | ICD-10-CM | POA: Diagnosis not present

## 2022-02-24 DIAGNOSIS — M549 Dorsalgia, unspecified: Secondary | ICD-10-CM | POA: Insufficient documentation

## 2022-02-24 DIAGNOSIS — E785 Hyperlipidemia, unspecified: Secondary | ICD-10-CM | POA: Diagnosis not present

## 2022-02-24 DIAGNOSIS — F419 Anxiety disorder, unspecified: Secondary | ICD-10-CM | POA: Insufficient documentation

## 2022-02-24 DIAGNOSIS — I110 Hypertensive heart disease with heart failure: Secondary | ICD-10-CM | POA: Insufficient documentation

## 2022-02-24 DIAGNOSIS — I5032 Chronic diastolic (congestive) heart failure: Secondary | ICD-10-CM | POA: Insufficient documentation

## 2022-02-24 DIAGNOSIS — R0602 Shortness of breath: Secondary | ICD-10-CM | POA: Insufficient documentation

## 2022-02-24 DIAGNOSIS — E079 Disorder of thyroid, unspecified: Secondary | ICD-10-CM | POA: Diagnosis not present

## 2022-02-24 DIAGNOSIS — I4891 Unspecified atrial fibrillation: Secondary | ICD-10-CM | POA: Diagnosis not present

## 2022-02-24 DIAGNOSIS — I10 Essential (primary) hypertension: Secondary | ICD-10-CM | POA: Diagnosis not present

## 2022-02-24 MED ORDER — POTASSIUM CHLORIDE ER 10 MEQ PO TBCR: 10.0000 meq | EXTENDED_RELEASE_TABLET | Freq: Every day | ORAL | 3 refills | Status: DC

## 2022-02-24 MED ORDER — FUROSEMIDE 20 MG PO TABS: 20.0000 mg | ORAL_TABLET | Freq: Every day | ORAL | 3 refills | Status: DC

## 2022-02-24 NOTE — Patient Instructions (Addendum)
Continue weighing daily and call for an overnight weight gain of 3 pounds or more or a weekly weight gain of more than 5 pounds.   If you have voicemail, please make sure your mailbox is cleaned out so that we may leave a message and please make sure to listen to any voicemails.    Per cardiology :  Decrease lasix to '20mg'$  daily. This will be 1/2 tablet daily.  Increase potassium to 31mq twice daily for 3 days and then decrease it to 137m daily

## 2022-02-24 NOTE — Telephone Encounter (Signed)
Reviewed results and recommendations with patient. She requested that I send in refills for her. Confirmed upcoming appointment and she verbalized understanding with no further questions.

## 2022-02-27 ENCOUNTER — Encounter: Payer: Medicare Other | Admitting: Physical Therapy

## 2022-02-28 ENCOUNTER — Ambulatory Visit: Payer: Medicare Other | Admitting: Physical Therapy

## 2022-02-28 ENCOUNTER — Encounter: Payer: Self-pay | Admitting: Pulmonary Disease

## 2022-02-28 ENCOUNTER — Ambulatory Visit (INDEPENDENT_AMBULATORY_CARE_PROVIDER_SITE_OTHER): Payer: Medicare Other | Admitting: Pulmonary Disease

## 2022-02-28 VITALS — BP 118/72 | HR 63 | Temp 97.3°F | Ht 62.5 in | Wt 203.8 lb

## 2022-02-28 DIAGNOSIS — J4489 Other specified chronic obstructive pulmonary disease: Secondary | ICD-10-CM

## 2022-02-28 DIAGNOSIS — I4819 Other persistent atrial fibrillation: Secondary | ICD-10-CM | POA: Diagnosis not present

## 2022-02-28 DIAGNOSIS — E669 Obesity, unspecified: Secondary | ICD-10-CM | POA: Diagnosis not present

## 2022-02-28 DIAGNOSIS — Z720 Tobacco use: Secondary | ICD-10-CM | POA: Diagnosis not present

## 2022-02-28 DIAGNOSIS — R0602 Shortness of breath: Secondary | ICD-10-CM

## 2022-02-28 MED ORDER — TRELEGY ELLIPTA 100-62.5-25 MCG/ACT IN AEPB: 1.0000 | INHALATION_SPRAY | Freq: Every day | RESPIRATORY_TRACT | 0 refills | Status: DC

## 2022-02-28 MED ORDER — TRELEGY ELLIPTA 100-62.5-25 MCG/ACT IN AEPB: 1.0000 | INHALATION_SPRAY | Freq: Every day | RESPIRATORY_TRACT | 11 refills | Status: DC

## 2022-02-28 NOTE — Patient Instructions (Signed)
We are switching your inhaler to Trelegy Ellipta 1 puff daily.  Rinse your mouth well after you use it.  You do not need to use your Symbicort any longer.  You can use your albuterol (rescue inhaler) as needed, up to 4 times a day.  Again, this is only if needed for shortness of breath or wheezing.  Please stop vaping.  This also affects your lungs.  We will see you in follow-up in 3 months time call sooner should any new problems arise.

## 2022-02-28 NOTE — Progress Notes (Signed)
Subjective:    Patient ID: Anita Harrell, female    DOB: [birth year], 71 y.o.   MRN: [id number] Patient Care Team: Tower, Audrie Gallus, MD as PCP - General (Family Medicine) Antonieta Iba, MD as PCP - Cardiology (Cardiology) Annamaria Helling, MD as Consulting Physician (Obstetrics and Gynecology) Phil Dopp, [facility] as Pharmacist (Pharmacist)  Chief Complaint  Patient presents with   Follow-up    DOE. Wheezing. No Cough.    HPI Patient is a 71 year old former smoker who currently vapes nicotine containing substance, who presents for follow-up. She was last seen on 29 November 2021. Recall that in May she presented for evaluation because of dyspnea on exertion. The patient also was experiencing fatigue and feeling that she was a "couch potato". She was noted at that time to be in atrial fibrillation with rapid ventricular response. She was placed on Eliquis and given an appointment for the following day for evaluation by cardiology. She has been seen by cardiology and they are attempting to control her atrial fibrillation.  At her prior visit we ordered PFTs and advised her to continue following with cardiology due to her atrial fib.  She does not endorse any fevers, chills or sweats. No cough or sputum production. No pain, lower extremity edema or calf tenderness.  She does have dyspnea on exertion, notes occasional wheezing as well. She does not endorse any other symptomatology today.  We reviewed PFTs performed 23 February 2022 these show a mixed obstructive/restrictive physiology.   Review of Systems A 10 point review of systems was performed and it is as noted above otherwise negative.  Patient Active Problem List   Diagnosis Date Noted   Chest pain 02/16/2022   General weakness 02/16/2022   Poor balance 02/16/2022   Falling 02/16/2022   COPD exacerbation (HCC) 02/13/2022   Fall at home, initial encounter 02/13/2022   Hypokalemia 02/13/2022   HTN (hypertension) 02/13/2022    Acute on chronic diastolic CHF (congestive heart failure) (HCC) 02/13/2022   DDD (degenerative disc disease), lumbar 12/26/2021   Bilateral hip bursitis 10/05/2021   Atrial fibrillation with RVR (HCC) 10/05/2021   Sleep walking 10/05/2021   Cerumen impaction 06/21/2021   Estrogen deficiency 06/07/2021   Overactive bladder 10/17/2019   Medicare annual wellness visit, subsequent 01/16/2019   Thyroid nodule 05/01/2018   Electronic cigarette use 04/03/2018   Obesity (BMI 30-39.9) 04/03/2018   Lung mass 02/01/2018   Abnormal chest x-ray 01/29/2018   Smokers' cough (HCC) 01/01/2018   Tachycardia 11/01/2015   Microscopic hematuria 08/13/2012   Routine general medical examination at a health care facility 07/22/2012   Leukocytosis 03/30/2010   Prediabetes 02/21/2008   HYPERTENSION, BENIGN ESSENTIAL 01/22/2008   ALLERGIC RHINITIS 07/25/2007   Low back pain 06/26/2007   Personal history of goiter 07/27/2006   Hyperlipidemia 07/27/2006   PANIC DISORDER 07/27/2006   Former smoker 07/27/2006   Depression with anxiety 07/27/2006   Insomnia 07/27/2006   Personal history of colonic polyps 06/09/2004   Social History   Tobacco Use   Smoking status: Former    Packs/day: 1.00    Types: E-cigarettes, Cigarettes    Quit date: 2011    Years since quitting: 12.9   Smokeless tobacco: Never   Tobacco comments:    She is vaping, former smoker    Substance Use Topics   Alcohol use: No    Alcohol/week: 0.0 standard drinks of alcohol   Allergies  Allergen Reactions   Abilify [Aripiprazole]     Vision  problem   Codeine Nausea And Vomiting    REACTION: Nausea and vomiting Pt has tolerated vicodin & percocet in the past   Cymbalta [Duloxetine Hcl]     More depressed/tearful    Lipitor [Atorvastatin]     Muscle pain    Wellbutrin [Bupropion]    Current Meds  Medication Sig   albuterol (VENTOLIN HFA) 108 (90 Base) MCG/ACT inhaler Inhale 2 puffs into the lungs every 6 (six) hours as needed  for wheezing or shortness of breath.   apixaban (ELIQUIS) 5 MG TABS tablet TAKE 1 TABLET BY MOUTH TWICE A DAY   budesonide-formoterol (SYMBICORT) 80-4.5 MCG/ACT inhaler Inhale 2 puffs into the lungs 2 (two) times daily.   busPIRone (BUSPAR) 30 MG tablet TAKE ONE HALF TO 1 TABLET BY MOUTH 3 TIMES DAILY AS DIRECTED   cyclobenzaprine (FLEXERIL) 10 MG tablet Take 10 mg by mouth 3 (three) times daily as needed for muscle spasms. 0.5-1 tablets   diltiazem (CARDIZEM CD) 120 MG 24 hr capsule Take 1 capsule (120 mg total) by mouth daily.   flecainide (TAMBOCOR) 100 MG tablet Take 1 tablet (100 mg total) by mouth 2 (two) times daily.   furosemide (LASIX) 20 MG tablet Take 1 tablet (20 mg total) by mouth daily.   metoprolol succinate (TOPROL-XL) 50 MG 24 hr tablet TAKE 1 TABLET BY MOUTH TWICE A DAY WITH MEALS   PARoxetine HCl (PAXIL PO) Take 1 tablet by mouth daily.   potassium chloride (KLOR-CON) 10 MEQ tablet Take 1 tablet (10 mEq total) by mouth daily.   rosuvastatin (CRESTOR) 10 MG tablet TAKE 1 TABLET BY MOUTH ONCE A DAY   solifenacin (VESICARE) 10 MG tablet TAKE 1 TABLET BY MOUTH ONCE A DAY   traMADol (ULTRAM) 50 MG tablet Take 1-2 tablets (50-100 mg total) by mouth every 8 (eight) hours as needed for moderate pain.   zolpidem (AMBIEN CR) 12.5 MG CR tablet TAKE 1 TABLET BY MOUTH ONCE DAILY AT BEDTIME AS NEEDED FOR SLEEP   Immunization History  Administered Date(s) Administered   Fluad Quad(high Dose 65+) 01/16/2019, 11/29/2021   Influenza Split 03/27/2011, 01/23/2012   Influenza,inj,Quad PF,6+ Mos 11/26/2012, 11/10/2013, 11/24/2016, 12/25/2017   PFIZER(Purple Top)SARS-COV-2 Vaccination 10/20/2019, 11/10/2019   Pneumococcal Conjugate-13 08/21/2014   Pneumococcal Polysaccharide-23 05/19/2008, 08/30/2015   Td 03/13/2001   Tdap 01/23/2012   Zoster Recombinat (Shingrix) 05/04/2021, 06/21/2021   Zoster, Live 01/07/2013       Objective:   Physical Exam BP 118/72 (BP Location: Left Arm, Cuff  Size: Large)   Pulse 63   Temp (!) 97.3 F (36.3 C)   Ht 5' 2.5" (1.588 m)   Wt 203 lb 12.8 oz (92.4 kg)   SpO2 95%   BMI 36.68 kg/m   SpO2: 95 % O2 Device: None (Room air)  GENERAL: Obese woman, no acute distress, fully ambulatory, no conversational dyspnea. HEAD: Normocephalic, atraumatic.  EYES: Pupils equal, round, reactive to light.  No scleral icterus.  MOUTH: Pharynx clear, oral mucosa moist. NECK: Supple. No thyromegaly. Trachea midline. No JVD.  No adenopathy. PULMONARY: Good air entry bilaterally.  No adventitious sounds. CARDIOVASCULAR: S1 and S2.  Irregular heart rate (atrial fibrillation) between 60-70 bpm, no cardiac murmurs discerned. ABDOMEN: Obese, otherwise benign. MUSCULOSKELETAL: No joint deformity, no clubbing, no edema.  NEUROLOGIC: No overt focal deficit, no gait disturbance, speech is fluent. SKIN: Intact,warm,dry. PSYCH: Mood and behavior normal.   Recent Results (from the past 2160 hour(s))  Exercise Tolerance Test     Status: None  Collection Time: 12/21/21  1:53 PM  Result Value Ref Range   Rest HR 108.0 bpm   Rest BP 123/90 mmHg   Exercise duration (min) 1 min   Exercise duration (sec) 34 sec   Estimated workload 3.8    Peak HR 142 bpm   Peak BP 149/88 mmHg   MPHR 148 bpm   Percent HR 95.0 %   ST Depression (mm) 0 mm  CBC     Status: Abnormal   Collection Time: 02/01/22  3:12 PM  Result Value Ref Range   WBC 9.7 4.0 - 10.5 K/uL   RBC 4.62 3.87 - 5.11 MIL/uL   Hemoglobin 13.5 12.0 - 15.0 g/dL   HCT 16.1 09.6 - 04.5 %   MCV 87.2 80.0 - 100.0 fL   MCH 29.2 26.0 - 34.0 pg   MCHC 33.5 30.0 - 36.0 g/dL   RDW 40.9 81.1 - 91.4 %   Platelets 144 (L) 150 - 400 K/uL   nRBC 0.0 0.0 - 0.2 %    Comment: Performed at [facility], [address] [place], [place] [address]  Basic Metabolic Panel (BMET)     Status: Abnormal   Collection Time: 02/01/22  3:12 PM  Result Value Ref Range   Sodium 140 135 - 145 mmol/L   Potassium 3.8 3.5 -  5.1 mmol/L   Chloride 105 98 - 111 mmol/L   CO2 27 22 - 32 mmol/L   Glucose, Bld 106 (H) 70 - 99 mg/dL    Comment: Glucose reference range applies only to samples taken after fasting for at least 8 hours.   BUN 10 8 - 23 mg/dL   Creatinine, Ser 6.21 0.44 - 1.00 mg/dL   Calcium 9.0 8.9 - 30.8 mg/dL   GFR, Estimated >65 >78 mL/min    Comment: (NOTE) Calculated using the CKD-EPI Creatinine Equation (2021)    Anion gap 8 5 - 15    Comment: Performed at [facility], [address] [place], [place] [address]  Basic metabolic panel     Status: Abnormal   Collection Time: 02/13/22  5:47 AM  Result Value Ref Range   Sodium 139 135 - 145 mmol/L   Potassium 3.2 (L) 3.5 - 5.1 mmol/L   Chloride 107 98 - 111 mmol/L   CO2 26 22 - 32 mmol/L   Glucose, Bld 119 (H) 70 - 99 mg/dL    Comment: Glucose reference range applies only to samples taken after fasting for at least 8 hours.   BUN 13 8 - 23 mg/dL   Creatinine, Ser 9.52 0.44 - 1.00 mg/dL   Calcium 8.8 (L) 8.9 - 10.3 mg/dL   GFR, Estimated >84 >13 mL/min    Comment: (NOTE) Calculated using the CKD-EPI Creatinine Equation (2021)    Anion gap 6 5 - 15    Comment: Performed at [facility], [address] [place], [place] [address]  CBC     Status: Abnormal   Collection Time: 02/13/22  5:47 AM  Result Value Ref Range   WBC 12.6 (H) 4.0 - 10.5 K/uL   RBC 4.08 3.87 - 5.11 MIL/uL   Hemoglobin 12.2 12.0 - 15.0 g/dL   HCT 02.7 25.3 - 66.4 %   MCV 90.0 80.0 - 100.0 fL   MCH 29.9 26.0 - 34.0 pg   MCHC 33.2 30.0 - 36.0 g/dL   RDW 40.3 47.4 - 25.9 %   Platelets 144 (L) 150 - 400 K/uL   nRBC 0.0  0.0 - 0.2 %    Comment: Performed at [facility], [address] [place], [place] [address]  Troponin I (High Sensitivity)     Status: None   Collection Time: 02/13/22  5:47 AM  Result Value Ref Range   Troponin I (High Sensitivity) 4 <18 ng/L    Comment: (NOTE) Elevated high sensitivity troponin I (hsTnI) values and  significant  changes across serial measurements may suggest ACS but many other  chronic and acute conditions are known to elevate hsTnI results.  Refer to the "Links" section for chest pain algorithms and additional  guidance. Performed at [facility], [address] [place], [place] [address]   D-dimer, quantitative     Status: None   Collection Time: 02/13/22  5:47 AM  Result Value Ref Range   D-Dimer, Quant 0.27 0.00 - 0.50 ug/mL-FEU    Comment: (NOTE) At the manufacturer cut-off value of 0.5 g/mL FEU, this assay has a negative predictive value of 95-100%.This assay is intended for use in conjunction with a clinical pretest probability (PTP) assessment model to exclude pulmonary embolism (PE) and deep venous thrombosis (DVT) in outpatients suspected of PE or DVT. Results should be correlated with clinical presentation. Performed at [facility], [address] [place], [place] [address]   Brain natriuretic peptide     Status: Abnormal   Collection Time: 02/13/22  5:47 AM  Result Value Ref Range   B Natriuretic Peptide 128.9 (H) 0.0 - 100.0 pg/mL    Comment: Performed at [facility], [address] [place], [place] [address]  Resp Panel by RT-PCR (Flu A&B, Covid) Anterior Nasal Swab     Status: None   Collection Time: 02/13/22  5:57 AM   Specimen: Anterior Nasal Swab  Result Value Ref Range   SARS Coronavirus 2 by RT PCR NEGATIVE NEGATIVE    Comment: (NOTE) SARS-CoV-2 target nucleic acids are NOT DETECTED.  The SARS-CoV-2 RNA is generally detectable in upper respiratory specimens during the acute phase of infection. The lowest concentration of SARS-CoV-2 viral copies this assay can detect is 138 copies/mL. A negative result does not preclude SARS-Cov-2 infection and should not be used as the sole basis for treatment or other patient management decisions. A negative result may occur with  improper specimen collection/handling, submission  of specimen other than nasopharyngeal swab, presence of viral mutation(s) within the areas targeted by this assay, and inadequate number of viral copies(<138 copies/mL). A negative result must be combined with clinical observations, patient history, and epidemiological information. The expected result is Negative.  Fact Sheet for Patients:  BloggerCourse.com  Fact Sheet for Healthcare Providers:  SeriousBroker.it  This test is no t yet approved or cleared by the [place] FDA and  has been authorized for detection and/or diagnosis of SARS-CoV-2 by FDA under an Emergency Use Authorization (EUA). This EUA will remain  in effect (meaning this test can be used) for the duration of the COVID-19 declaration under Section 564(b)(1) of the Act, 21 U.S.C.section 360bbb-3(b)(1), unless the authorization is terminated  or revoked sooner.       Influenza A by PCR NEGATIVE NEGATIVE   Influenza B by PCR NEGATIVE NEGATIVE    Comment: (NOTE) The Xpert Xpress SARS-CoV-2/FLU/RSV plus assay is intended as an aid in the diagnosis of influenza from Nasopharyngeal swab specimens and should not be used as a sole basis for treatment. Nasal washings and aspirates are unacceptable for Xpert Xpress SARS-CoV-2/FLU/RSV testing.  Fact Sheet for Patients:  BloggerCourse.com  Fact Sheet for Healthcare Providers: SeriousBroker.it  This test is not yet approved or cleared by the [place] FDA and has been authorized for detection and/or diagnosis of SARS-CoV-2 by FDA under an Emergency Use Authorization (EUA). This EUA will remain in effect (meaning this test can be used) for the duration of the COVID-19 declaration under Section 564(b)(1) of the Act, 21 U.S.C. section 360bbb-3(b)(1), unless the authorization is terminated or revoked.  Performed at [facility], [address]  [place], [place] [address]   Procalcitonin     Status: None   Collection Time: 02/13/22  8:38 AM  Result Value Ref Range   Procalcitonin <0.10 ng/mL    Comment:        Interpretation: PCT (Procalcitonin) <= 0.5 ng/mL: Systemic infection (sepsis) is not likely. Local bacterial infection is possible. (NOTE)       Sepsis PCT Algorithm           Lower Respiratory Tract                                      Infection PCT Algorithm    ----------------------------     ----------------------------         PCT < 0.25 ng/mL                PCT < 0.10 ng/mL          Strongly encourage             Strongly discourage   discontinuation of antibiotics    initiation of antibiotics    ----------------------------     -----------------------------       PCT 0.25 - 0.50 ng/mL            PCT 0.10 - 0.25 ng/mL               OR       >80% decrease in PCT            Discourage initiation of                                            antibiotics      Encourage discontinuation           of antibiotics    ----------------------------     -----------------------------         PCT >= 0.50 ng/mL              PCT 0.26 - 0.50 ng/mL               AND        <80% decrease in PCT             Encourage initiation of                                             antibiotics       Encourage continuation           of antibiotics    ----------------------------     -----------------------------        PCT >= 0.50 ng/mL  PCT > 0.50 ng/mL               AND         increase in PCT                  Strongly encourage                                      initiation of antibiotics    Strongly encourage escalation           of antibiotics                                     -----------------------------                                           PCT <= 0.25 ng/mL                                                 OR                                        > 80% decrease in PCT                                       Discontinue / Do not initiate                                             antibiotics  Performed at [facility], [address] [place], [place] [address]   Magnesium     Status: None   Collection Time: 02/13/22  8:38 AM  Result Value Ref Range   Magnesium 1.9 1.7 - 2.4 mg/dL    Comment: Performed at [facility], [address], [place], [place] [address]  Troponin I (High Sensitivity)     Status: None   Collection Time: 02/13/22  8:40 AM  Result Value Ref Range   Troponin I (High Sensitivity) 5 <18 ng/L    Comment: (NOTE) Elevated high sensitivity troponin I (hsTnI) values and significant  changes across serial measurements may suggest ACS but many other  chronic and acute conditions are known to elevate hsTnI results.  Refer to the "Links" section for chest pain algorithms and additional  guidance. Performed at [facility], [address] [place], [place] [address]   Lactic acid, plasma     Status: Abnormal   Collection Time: 02/13/22 11:12 AM  Result Value Ref Range   Lactic Acid, Venous 4.0 (HH) 0.5 - 1.9 mmol/L    Comment: CRITICAL RESULT CALLED TO, READ BACK BY AND VERIFIED WITH LINDA [facility] 02/13/22 1150 KLW Performed at [facility], [address] [place], [place] [address]   Lactic acid, plasma     Status:  None   Collection Time: 02/14/22  3:47 AM  Result Value Ref Range   Lactic Acid, Venous 1.6 0.5 - 1.9 mmol/L    Comment: Performed at [facility], [address] [place], [place] [address]  Magnesium     Status: None   Collection Time: 02/14/22  3:47 AM  Result Value Ref Range   Magnesium 1.9 1.7 - 2.4 mg/dL    Comment: Performed at [facility], [address] [place], [place] [address]  Basic metabolic panel     Status: Abnormal   Collection Time: 02/14/22  3:47 AM  Result Value Ref Range   Sodium 140 135 - 145 mmol/L   Potassium 3.3 (L) 3.5 - 5.1 mmol/L   Chloride 106 98 - 111 mmol/L    CO2 26 22 - 32 mmol/L   Glucose, Bld 153 (H) 70 - 99 mg/dL    Comment: Glucose reference range applies only to samples taken after fasting for at least 8 hours.   BUN 8 8 - 23 mg/dL   Creatinine, Ser 0.98 0.44 - 1.00 mg/dL   Calcium 9.3 8.9 - 11.9 mg/dL   GFR, Estimated >14 >78 mL/min    Comment: (NOTE) Calculated using the CKD-EPI Creatinine Equation (2021)    Anion gap 8 5 - 15    Comment: Performed at [facility], [address] [place], [place] [address]  ECHOCARDIOGRAM COMPLETE     Status: None   Collection Time: 02/14/22  1:12 PM  Result Value Ref Range   Weight 3,304 oz   Height 62 in   BP 126/83 mmHg   Ao pk vel 1.47 m/s   AV Area VTI 2.67 cm2   AR max vel 2.27 cm2   AV Mean grad 4.0 mmHg   AV Peak grad 8.6 mmHg   S' Lateral 3.30 cm   AV Area mean vel 2.45 cm2   Area-P 1/2 3.87 cm2  Basic metabolic panel     Status: Abnormal   Collection Time: 02/21/22 11:28 AM  Result Value Ref Range   Sodium 138 135 - 145 mmol/L   Potassium 3.4 (L) 3.5 - 5.1 mmol/L   Chloride 107 98 - 111 mmol/L   CO2 23 22 - 32 mmol/L   Glucose, Bld 132 (H) 70 - 99 mg/dL    Comment: Glucose reference range applies only to samples taken after fasting for at least 8 hours.   BUN 15 8 - 23 mg/dL   Creatinine, Ser 1.30 0.44 - 1.00 mg/dL   Calcium 9.4 8.9 - 86.5 mg/dL   GFR, Estimated >78 >46 mL/min    Comment: (NOTE) Calculated using the CKD-EPI Creatinine Equation (2021)    Anion gap 8 5 - 15    Comment: Performed at [facility], [address] [place], [place] [address]  CBC     Status: Abnormal   Collection Time: 02/21/22 11:28 AM  Result Value Ref Range   WBC 17.5 (H) 4.0 - 10.5 K/uL   RBC 5.20 (H) 3.87 - 5.11 MIL/uL   Hemoglobin 15.3 (H) 12.0 - 15.0 g/dL   HCT 28.4 13.2 - 44.0 %   MCV 86.9 80.0 - 100.0 fL   MCH 29.4 26.0 - 34.0 pg   MCHC 33.8 30.0 - 36.0 g/dL   RDW 10.2 72.5 - 36.6 %   Platelets 150 150 - 400 K/uL   nRBC 0.0 0.0 - 0.2 %    Comment: Performed  at [facility], [address]  [address] [place], [place] [address]  Pulmonary Function Test [facility]     Status: None   Collection Time: 02/23/22  1:54 PM  Result Value Ref Range   FVC-Pre 1.99 L   FVC-%Pred-Pre 65 %   FVC-Post 2.06 L   FVC-%Pred-Post 67 %   FVC-%Change-Post 3 %   FEV1-Pre 1.66 L   FEV1-%Pred-Pre 71 %   FEV1-Post 1.84 L   FEV1-%Pred-Post 80 %   FEV1-%Change-Post 11 %   FEV6-Pre 1.99 L   FEV6-%Pred-Pre 68 %   FEV6-Post 2.06 L   FEV6-%Pred-Post 70 %   FEV6-%Change-Post 3 %   Pre FEV1/FVC ratio 83 %   FEV1FVC-%Pred-Pre 109 %   Post FEV1/FVC ratio 89 %   FEV1FVC-%Change-Post 7 %   Pre FEV6/FVC Ratio 100 %   FEV6FVC-%Pred-Pre 104 %   Post FEV6/FVC ratio 100 %   FEV6FVC-%Pred-Post 104 %   FEF 25-75 Pre 1.99 L/sec   FEF2575-%Pred-Pre 106 %   FEF 25-75 Post 3.08 L/sec   FEF2575-%Pred-Post 165 %   FEF2575-%Change-Post 55 %   RV 1.76 L   RV % pred 77 %   TLC 4.09 L   TLC % pred 78 %   DLCO unc 14.47 ml/min/mmHg   DLCO unc % pred 72 %   DL/VA 6.04 ml/min/mmHg/L   DL/VA % pred 84 %      Assessment & Plan:     ICD-10-CM   1. COPD with asthma  J44.89    Trial of Trelegy 100, 1 inhalation daily Continue as needed albuterol    2. Shortness of breath  R06.02    Multifactorial A-fib, obesity/deconditioning Poorly compensated COPD    3. Persistent atrial fibrillation (HCC)  I48.19    This patient was complexity to her management Follows with cardiology    4. Vapes nicotine containing substance  Z72.0    Patient counseled regarding discontinuation of vaping Total counseling time 3 to 5 minutes    5. Obesity (BMI 30-39.9)  E66.9    Patient will benefit from weight loss     Meds ordered this encounter  Medications   Fluticasone-Umeclidin-Vilant (TRELEGY ELLIPTA) 100-62.5-25 MCG/ACT AEPB    Sig: Inhale 1 puff into the lungs daily.    Dispense:  28 each    Refill:  11   Will see the patient in follow-up in 3 months time she is to call sooner  should any new problems arise.  Gailen Shelter, MD Advanced Bronchoscopy PCCM [facility]    *This note was dictated using voice recognition software/Dragon.  Despite best efforts to proofread, errors can occur which can change the meaning. Any transcriptional errors that result from this process are unintentional and may not be fully corrected at the time of dictation.

## 2022-03-02 ENCOUNTER — Telehealth: Payer: Self-pay | Admitting: Cardiovascular Disease

## 2022-03-02 ENCOUNTER — Encounter: Payer: Medicare Other | Admitting: Physical Therapy

## 2022-03-02 ENCOUNTER — Other Ambulatory Visit: Payer: Self-pay

## 2022-03-02 ENCOUNTER — Ambulatory Visit
Admission: RE | Admit: 2022-03-02 | Discharge: 2022-03-02 | Disposition: A | Discharge status: Home / Self Care | Payer: Medicare Other | Attending: Cardiovascular Disease | Admitting: Cardiovascular Disease

## 2022-03-02 ENCOUNTER — Encounter
Admission: RE | Disposition: A | Discharge status: Home / Self Care | Payer: Self-pay | Source: Home / Self Care | Attending: Cardiovascular Disease

## 2022-03-02 ENCOUNTER — Encounter: Payer: Self-pay | Admitting: Cardiovascular Disease

## 2022-03-02 ENCOUNTER — Ambulatory Visit: Payer: Medicare Other | Admitting: Anesthesiology

## 2022-03-02 DIAGNOSIS — Z538 Procedure and treatment not carried out for other reasons: Secondary | ICD-10-CM | POA: Insufficient documentation

## 2022-03-02 DIAGNOSIS — I110 Hypertensive heart disease with heart failure: Secondary | ICD-10-CM | POA: Diagnosis not present

## 2022-03-02 DIAGNOSIS — G4730 Sleep apnea, unspecified: Secondary | ICD-10-CM | POA: Insufficient documentation

## 2022-03-02 DIAGNOSIS — Z79899 Other long term (current) drug therapy: Secondary | ICD-10-CM | POA: Insufficient documentation

## 2022-03-02 DIAGNOSIS — Z87891 Personal history of nicotine dependence: Secondary | ICD-10-CM | POA: Diagnosis not present

## 2022-03-02 DIAGNOSIS — I4891 Unspecified atrial fibrillation: Secondary | ICD-10-CM | POA: Insufficient documentation

## 2022-03-02 DIAGNOSIS — J449 Chronic obstructive pulmonary disease, unspecified: Secondary | ICD-10-CM | POA: Diagnosis not present

## 2022-03-02 DIAGNOSIS — I509 Heart failure, unspecified: Secondary | ICD-10-CM | POA: Diagnosis not present

## 2022-03-02 DIAGNOSIS — Z6835 Body mass index (BMI) 35.0-35.9, adult: Secondary | ICD-10-CM | POA: Insufficient documentation

## 2022-03-02 DIAGNOSIS — I4819 Other persistent atrial fibrillation: Secondary | ICD-10-CM

## 2022-03-02 HISTORY — PX: CARDIOVERSION: SHX1299

## 2022-03-02 SURGERY — CARDIOVERSION
Anesthesia: General

## 2022-03-02 MED ORDER — SODIUM CHLORIDE 0.9 % IV SOLN: INTRAVENOUS | Status: DC

## 2022-03-02 NOTE — Telephone Encounter (Signed)
Presented for cardioversion, noted to be in normal rhythm Cardioversion cancelled Second time she has converted to NSR on flecainide Will continue current medications and arrange follow up in clinic 1 -2 months  Signed, Esmond Plants, MD, Ph.D [facility]

## 2022-03-02 NOTE — Progress Notes (Signed)
Patient chemically converted on her own. No cardioversion needed. Instructed to send patient home per MD.

## 2022-03-02 NOTE — Anesthesia Preprocedure Evaluation (Signed)
Anesthesia Evaluation  Patient identified by MRN, date of birth, ID band Patient awake    Reviewed: Allergy & Precautions, NPO status , Patient's Chart, lab work & pertinent test results  Airway Mallampati: II  TM Distance: >3 FB Neck ROM: full    Dental  (+) Teeth Intact, Caps,    Pulmonary neg pulmonary ROS, sleep apnea , COPD, former smoker   Pulmonary exam normal  + decreased breath sounds      Cardiovascular Exercise Tolerance: Poor hypertension, Pt. on medications +CHF  negative cardio ROS Normal cardiovascular exam+ dysrhythmias Atrial Fibrillation  Rhythm:Irregular     Neuro/Psych   Anxiety Depression    negative neurological ROS  negative psych ROS   GI/Hepatic negative GI ROS, Neg liver ROS,,,  Endo/Other  negative endocrine ROS  Morbid obesity  Renal/GU negative Renal ROS  negative genitourinary   Musculoskeletal   Abdominal  (+) + obese  Peds negative pediatric ROS (+)  Hematology negative hematology ROS (+)   Anesthesia Other Findings Past Medical History: No date: Allergy     Comment:  allergic rhinitis No date: Anxiety No date: Arrhythmia     Comment:  atrial fibrillation No date: Back pain No date: Chest pain No date: CHF (congestive heart failure) (HCC) No date: COPD (chronic obstructive pulmonary disease) (HCC) No date: Depression No date: Epigastric pain No date: Goiter No date: History of tobacco abuse No date: Hyperglycemia     Comment:  mild No date: Hyperlipidemia No date: Hypertension No date: Insomnia     Comment:  Hx of No date: Labile blood pressure No date: Panic disorder     Comment:  History of 06/09/2004: Personal history of colonic polyps No date: SVD (spontaneous vaginal delivery)     Comment:  x 2  Past Surgical History: 09/24/2012: ABDOMINAL HYSTERECTOMY; N/A     Comment:  Procedure: HYSTERECTOMY ABDOMINAL;  Surgeon: Gus Height,              MD;  Location: [facility];  Service: Gynecology;  Laterality:              N/A; No date: BREAST BIOPSY 02/09/2022: CARDIOVERSION; N/A     Comment:  Procedure: CARDIOVERSION;  Surgeon: Minna Merritts,               MD;  Location: [facility];  Service: Cardiovascular;                Laterality: N/A; No date: COLONOSCOPY 2017: COLONOSCOPY 2004: ELBOW SURGERY No date: EYE SURGERY     Comment:  bilateral lasik 09/24/2012: SALPINGOOPHORECTOMY; Bilateral     Comment:  Procedure: SALPINGO OOPHORECTOMY;  Surgeon: Gus Height,               MD;  Location: [facility];  Service: Gynecology;  Laterality:              Bilateral; No date: THYROID SURGERY     Comment:  B9 massess 09/24/2012: WART FULGURATION; N/A     Comment:  Procedure: FULGURATION VAGINAL WART;  Surgeon: Gus Height, MD;  Location: [facility];  Service: Gynecology;                Laterality: N/A; No date: WISDOM TOOTH EXTRACTION  BMI    Body Mass Index: 35.43 kg/m      Reproductive/Obstetrics negative OB ROS  Anesthesia Physical Anesthesia Plan  ASA: 3  Anesthesia Plan: General   Post-op Pain Management:    Induction: Intravenous  PONV Risk Score and Plan: Propofol infusion and TIVA  Airway Management Planned: Natural Airway  Additional Equipment:   Intra-op Plan:   Post-operative Plan:   Informed Consent: I have reviewed the patients History and Physical, chart, labs and discussed the procedure including the risks, benefits and alternatives for the proposed anesthesia with the patient or authorized representative who has indicated his/her understanding and acceptance.     Dental Advisory Given  Plan Discussed with: CRNA and Surgeon  Anesthesia Plan Comments:        Anesthesia Quick Evaluation

## 2022-03-03 ENCOUNTER — Encounter: Payer: Self-pay | Admitting: Cardiovascular Disease

## 2022-03-07 DIAGNOSIS — F332 Major depressive disorder, recurrent severe without psychotic features: Secondary | ICD-10-CM | POA: Diagnosis not present

## 2022-03-07 DIAGNOSIS — F411 Generalized anxiety disorder: Secondary | ICD-10-CM | POA: Diagnosis not present

## 2022-03-07 DIAGNOSIS — F5101 Primary insomnia: Secondary | ICD-10-CM | POA: Diagnosis not present

## 2022-03-08 ENCOUNTER — Other Ambulatory Visit: Payer: Self-pay | Admitting: Family Medicine

## 2022-03-09 ENCOUNTER — Encounter: Payer: Medicare Other | Admitting: Physical Therapy

## 2022-03-10 ENCOUNTER — Ambulatory Visit: Payer: Medicare Other | Admitting: Physician Assistant

## 2022-03-10 ENCOUNTER — Ambulatory Visit: Payer: Medicare Other | Admitting: Cardiology

## 2022-03-14 ENCOUNTER — Encounter: Payer: Medicare Other | Admitting: Physical Therapy

## 2022-03-15 ENCOUNTER — Telehealth: Payer: Self-pay | Admitting: Physical Therapy

## 2022-03-15 ENCOUNTER — Ambulatory Visit: Payer: Self-pay | Admitting: Physical Therapy

## 2022-03-15 ENCOUNTER — Ambulatory Visit: Payer: Medicare Other | Admitting: Physical Therapy

## 2022-03-15 DIAGNOSIS — Z9181 History of falling: Secondary | ICD-10-CM | POA: Insufficient documentation

## 2022-03-15 DIAGNOSIS — G8929 Other chronic pain: Secondary | ICD-10-CM | POA: Insufficient documentation

## 2022-03-15 DIAGNOSIS — R2681 Unsteadiness on feet: Secondary | ICD-10-CM | POA: Insufficient documentation

## 2022-03-15 DIAGNOSIS — M5459 Other low back pain: Secondary | ICD-10-CM

## 2022-03-15 DIAGNOSIS — M5441 Lumbago with sciatica, right side: Secondary | ICD-10-CM | POA: Insufficient documentation

## 2022-03-15 DIAGNOSIS — M5431 Sciatica, right side: Secondary | ICD-10-CM

## 2022-03-15 DIAGNOSIS — M6281 Muscle weakness (generalized): Secondary | ICD-10-CM

## 2022-03-15 DIAGNOSIS — R531 Weakness: Secondary | ICD-10-CM | POA: Insufficient documentation

## 2022-03-15 DIAGNOSIS — R262 Difficulty in walking, not elsewhere classified: Secondary | ICD-10-CM | POA: Insufficient documentation

## 2022-03-15 DIAGNOSIS — R2689 Other abnormalities of gait and mobility: Secondary | ICD-10-CM | POA: Insufficient documentation

## 2022-03-15 NOTE — Telephone Encounter (Signed)
Called patient when she did not come to her appointment scheduled at 2:30pm today. Patient answered and states she "called up there" and let us know she didn't feel like the walking was helping her and she wanted to stop PT and join [employer] instead. I let her know we would discharge her from PT and update our schedule.   Anita Harrell. Graylon Good, PT, DPT 03/15/22, 2:53 PM  [facility] [address] [place], [place] [address] P: [phone] I F: [phone]

## 2022-03-15 NOTE — Therapy (Signed)
OUTPATIENT PHYSICAL THERAPY NO-VISIT DISCHARGE SUMMARY Dates of reporting 02/20/2022 to 03/15/2022   Patient Name: Anita Harrell MRN: [id number] DOB:05/27/1950, 72 y.o., female Today's Date: 03/15/2022  PCP: Abner Greenspan, MD  REFERRING PROVIDER: Abner Greenspan, MD   END OF SESSION:    PT End of Session - 03/15/22 1503     Visit Number 0             Past Medical History:  Diagnosis Date   Allergy    allergic rhinitis   Anxiety    Arrhythmia    atrial fibrillation   Back pain    Chest pain    CHF (congestive heart failure) (HCC)    COPD (chronic obstructive pulmonary disease) ([place])    Depression    Epigastric pain    Goiter    History of tobacco abuse    Hyperglycemia    mild   Hyperlipidemia    Hypertension    Insomnia    Hx of   Labile blood pressure    Panic disorder    History of   Personal history of colonic polyps 06/09/2004   SVD (spontaneous vaginal delivery)    x 2   Past Surgical History:  Procedure Laterality Date   ABDOMINAL HYSTERECTOMY N/A 09/24/2012   Procedure: HYSTERECTOMY ABDOMINAL;  Surgeon: Gus Height, MD;  Location: [facility];  Service: Gynecology;  Laterality: N/A;   BREAST BIOPSY     CARDIOVERSION N/A 02/09/2022   Procedure: CARDIOVERSION;  Surgeon: Minna Merritts, MD;  Location: [facility];  Service: Cardiovascular;  Laterality: N/A;   CARDIOVERSION N/A 03/02/2022   Procedure: CARDIOVERSION;  Surgeon: Minna Merritts, MD;  Location: [facility];  Service: Cardiovascular;  Laterality: N/A;   COLONOSCOPY     COLONOSCOPY  2017   ELBOW SURGERY  2004   EYE SURGERY     bilateral lasik   SALPINGOOPHORECTOMY Bilateral 09/24/2012   Procedure: SALPINGO OOPHORECTOMY;  Surgeon: Gus Height, MD;  Location: [facility];  Service: Gynecology;  Laterality: Bilateral;   THYROID SURGERY     B9 massess   WART FULGURATION N/A 09/24/2012   Procedure: FULGURATION VAGINAL WART;  Surgeon: Gus Height, MD;  Location: [facility];  Service: Gynecology;  Laterality:  N/A;   WISDOM TOOTH EXTRACTION     Patient Active Problem List   Diagnosis Date Noted   Chest pain 02/16/2022   General weakness 02/16/2022   Poor balance 02/16/2022   Falling 02/16/2022   COPD exacerbation ([place]) 02/13/2022   Fall at home, initial encounter 02/13/2022   Hypokalemia 02/13/2022   HTN (hypertension) 02/13/2022   Acute on chronic diastolic CHF (congestive heart failure) ([place]) 02/13/2022   DDD (degenerative disc disease), lumbar 12/26/2021   Bilateral hip bursitis 10/05/2021   Atrial fibrillation with RVR ([place]) 10/05/2021   Sleep walking 10/05/2021   Cerumen impaction 06/21/2021   Estrogen deficiency 06/07/2021   Overactive bladder 10/17/2019   Medicare annual wellness visit, subsequent 01/16/2019   Thyroid nodule 05/01/2018   Electronic cigarette use 04/03/2018   Obesity (BMI 30-39.9) 04/03/2018   Lung mass 02/01/2018   Abnormal chest x-ray 01/29/2018   Smokers' cough ([place]) 01/01/2018   Tachycardia 11/01/2015   Microscopic hematuria 08/13/2012   Routine general medical examination at a health care facility 07/22/2012   Leukocytosis 03/30/2010   Prediabetes 02/21/2008   HYPERTENSION, BENIGN ESSENTIAL 01/22/2008   ALLERGIC RHINITIS 07/25/2007   Low back pain 06/26/2007   Personal history of goiter 07/27/2006   Hyperlipidemia 07/27/2006  PANIC DISORDER 07/27/2006   Former smoker 07/27/2006   Depression with anxiety 07/27/2006   Insomnia 07/27/2006   Personal history of colonic polyps 06/09/2004    REFERRING DIAG: generalized weakness, chronic right-sided low back pain with right-sided sciatica, poor balance   THERAPY DIAG:  Muscle weakness (generalized)  Unsteadiness on feet  History of falling  Other low back pain  Sciatica, right side  Difficulty in walking, not elsewhere classified  Rationale for Evaluation and Treatment: Rehabilitation  PERTINENT HISTORY: Patient is a 72 y.o. female who presents to outpatient physical therapy with a  referral for medical diagnosis lumbosacral degenerative disc disease, chronic back pain greater than 3 months duration. This patient's chief complaints consist of chronic bilateral low back pain (R > L) radiating to right glute/hip region leading to the following functional deficits: difficulty with "it all" including walking, standing, shopping, preparing meals, traveling, visiting her beach house, prolonged sitting, stairs, being active, transfers, bed mobility, keeping your balance and not falling. Relevant past medical history and comorbidities include afib (on eliquis), emphysema, anxiety, depression, goiter, HTN, insomnia, epigastric pain, lung mass in left lung (stable in 3 years), prediabetes, osteopenia, does endorse increased loose stool and gas recently, memory (self-described), COPD, CHF,  recent hospitalization.  Patient denies hx of cancer, stroke, seizures, diabetes, unexplained weight loss, unexplained stumbling or dropping things, osteoporosis, and spinal surgery    PRECAUTIONS: fall risk  SUBJECTIVE:                                                                                                                                                                                      SUBJECTIVE STATEMENT:  PT called patient when she did not show up to PT appointment scheduled at 2:30 pm today. Patient answered and said she had called the clinic and let them know she did not feel like walking was helping her and she wanted to discharge from PT and start [employer] at the gym instead.    PAIN:  Are you having pain? N/A   OBJECTIVE:   Patient is not present for examination at this time. Please see previous documentation for latest objective data.    ASSESSMENT:   CLINICAL IMPRESSION: Since hospitalization patient attended initial eval only on 02/20/2022 for current episode of care. She has now requested discharge from PT and said she would rather go to [facility]. She has not  made progress towards any of her goals as she has not attended PT since her initial eval. Patient is now discharged per her request.   From initial eval 02/20/2022:  Patient is a 72 y.o. female referred to outpatient physical therapy with a medical  diagnosis of generalized weakness, chronic right-sided low back pain with right-sided sciatica, poor balance who presents with signs and symptoms consistent with generalized weakness/deconditioning, unsteadiness on feet, elevated fall risk, and chronic low back pain with radiation towards R LE with recent hospitalization exacerbating fall risk and weakness. Patient's back pain has responded well to specific exercises for extension preference during episode of care for low back pain prior to hospitalization. Patient presents with significant pain, ROM, joint stiffness, balance, motor control, flexibility, knowledge, muscle performance (strength/power/endurance) and activity tolerance impairments that are limiting ability to complete her usual activities such as "it all" including walking, standing, shopping, preparing meals, traveling, visiting her beach house, prolonged sitting, stairs, being active, transfers, bed mobility, keeping your balance and not falling without difficulty. Patient will benefit from skilled physical therapy intervention to address current body structure impairments and activity limitations to improve function and work towards goals set in current POC in order to return to prior level of function or maximal functional improvement.      OBJECTIVE IMPAIRMENTS: Abnormal gait, cardiopulmonary status limiting activity, decreased activity tolerance, decreased balance, decreased cognition, decreased coordination, decreased endurance, decreased knowledge of condition, decreased knowledge of use of DME, decreased mobility, difficulty walking, decreased ROM, decreased strength, decreased safety awareness, hypomobility, impaired perceived functional  ability, impaired flexibility, improper body mechanics, postural dysfunction, obesity, and pain.    ACTIVITY LIMITATIONS: carrying, lifting, bending, standing, squatting, stairs, transfers, bed mobility, bathing, toileting, hygiene/grooming, and locomotion level   PARTICIPATION LIMITATIONS: meal prep, cleaning, laundry, interpersonal relationship, shopping, community activity, and   "it all" including walking, standing, shopping, preparing meals, traveling, visiting her beach house, prolonged sitting, stairs, being active, transfers, bed mobility, keeping your balance and not falling   PERSONAL FACTORS: Fitness, Past/current experiences, Time since onset of injury/illness/exacerbation, and 3+ comorbidities:   afib (on eliquis), emphysema, anxiety, depression, goiter, HTN, insomnia, epigastric pain, lung mass in left lung (stable in 3 years), prediabetes, osteopenia, does endorse increased loose stool and gas recently, memory (self-described), COPD, CHF,  recent hospitalization are also affecting patient's functional outcome.    REHAB POTENTIAL: Good   CLINICAL DECISION MAKING: Evolving/moderate complexity   EVALUATION COMPLEXITY: Moderate     GOALS: Goals reviewed with patient? No   SHORT TERM GOALS: Target date: 03/06/2022   Patient will be independent with initial home exercise program for self-management of symptoms. Baseline: Initial HEP to be provided at visit 2 as appropriate (02/20/2022); Goal status: Not met     LONG TERM GOALS: Target date: 05/15/2022    Patient will be independent with a long-term home exercise program for self-management of symptoms.  Baseline: Initial HEP to be provided at visit 2 as appropriate (02/20/2022); Goal status: Not met   2.  Patient will demonstrate improved FOTO to equal or greater than 51 by visit #13 to demonstrate improvement in overall condition and self-reported functional ability.  Baseline: 43 (02/20/2022); Goal status: not met   3.   Patient will ambulate equal or greater than 1000 feet with LRAD to demonstrate improvement in community mobility.  Baseline: 635 feet with RW, no back pain, limited by feeling out of breath, reports left arm discomfort. (02/20/2022); Goal status: not met   4.  Patient will demonstrate bilateral hip abduction strength equal or greater than 4+/5 on MMT to improve strength for single leg stance activities such as walking and climbing stairs.  Baseline: R 3+/5, L 4/5 (02/20/2022); Goal status: not met   5.  Patient will complete community,  work and/or recreational activities with 50% less limitation due to current condition.  Baseline: difficulty with "it all" including walking, standing, shopping, preparing meals, traveling, visiting her beach house, prolonged sitting, stairs, being active, transfers, bed mobility, keeping your balance and not falling (02/20/2022); Goal status: not met   6.  Patient will score equal or greater than 25/30 on Functional Gait Assessment to demonstrate low fall risk.  Baseline: to be tested visit 2 as appropriate (02/20/2022);  Goal status: not met     PLAN:   PT FREQUENCY: 1-2x/week   PT DURATION: 12 weeks   PLANNED INTERVENTIONS: Therapeutic exercises, Therapeutic activity, Neuromuscular re-education, Balance training, Gait training, Patient/Family education, Self Care, Joint mobilization, Stair training, Vestibular training, DME instructions, Dry Needling, Cognitive remediation, Electrical stimulation, Spinal mobilization, Cryotherapy, Moist heat, Manual therapy, and Re-evaluation   PLAN FOR NEXT SESSION: Patient is discharged from PT per her request.    Everlean Alstrom. Graylon Good, PT, DPT 03/15/22, 3:04 PM  [facility] [address] [place], [place] [address] P: [phone] I F: [phone]

## 2022-03-16 ENCOUNTER — Encounter: Payer: Medicare Other | Admitting: Physical Therapy

## 2022-03-20 ENCOUNTER — Ambulatory Visit: Payer: Medicare Other | Admitting: Physical Therapy

## 2022-03-22 ENCOUNTER — Ambulatory Visit: Payer: Medicare Other | Admitting: Physical Therapy

## 2022-03-27 ENCOUNTER — Ambulatory Visit: Payer: Medicare Other | Admitting: Physical Therapy

## 2022-03-28 DIAGNOSIS — F5101 Primary insomnia: Secondary | ICD-10-CM | POA: Diagnosis not present

## 2022-03-28 DIAGNOSIS — F411 Generalized anxiety disorder: Secondary | ICD-10-CM | POA: Diagnosis not present

## 2022-03-28 DIAGNOSIS — F332 Major depressive disorder, recurrent severe without psychotic features: Secondary | ICD-10-CM | POA: Diagnosis not present

## 2022-03-29 ENCOUNTER — Encounter: Payer: Medicare Other | Admitting: Physical Therapy

## 2022-03-30 ENCOUNTER — Other Ambulatory Visit: Payer: Self-pay | Admitting: Cardiovascular Disease

## 2022-04-03 ENCOUNTER — Encounter: Payer: Medicare Other | Admitting: Physical Therapy

## 2022-04-05 ENCOUNTER — Encounter: Payer: Medicare Other | Admitting: Physical Therapy

## 2022-04-05 ENCOUNTER — Other Ambulatory Visit: Payer: Self-pay | Admitting: Family Medicine

## 2022-04-06 ENCOUNTER — Telehealth: Payer: Self-pay | Admitting: Family

## 2022-04-06 ENCOUNTER — Encounter: Payer: Medicare Other | Admitting: Family

## 2022-04-06 ENCOUNTER — Other Ambulatory Visit (HOSPITAL_COMMUNITY): Payer: Self-pay

## 2022-04-06 NOTE — Telephone Encounter (Signed)
Patient did not show for her [facility] Failure [facility] appointment on 04/06/22. Will attempt to reschedule.

## 2022-04-06 NOTE — Telephone Encounter (Signed)
Name of Medication: Tramadol **last filled by Dr. Lorelei Pont** Name of Pharmacy: [facility] Date and Quantity: 01/26/22 #50 tab/ 2 refills Last Office Visit and Type: Hospital f/u 02/16/22 Next Office Visit and Type:  none scheduled   Crestor last filled on 12/30/21 #90 tabs/ 0 refills

## 2022-04-06 NOTE — Progress Notes (Deleted)
Patient ID: Anita Harrell, female    DOB: 1950/12/16, 72 y.o.   MRN: [id number]  HPI  Anita Harrell is a 72 y/o female with a history of atrial fibrillation, hyperlipidemia, HTN, thyroid disease, anxiety, COPD, depression, panic attacks and chronic heart failure.   Echo report from 02/14/22 showed an EF of 50-55% along with mild LVH  Stress test 12/21/21 was normal.   Admitted 02/13/22 due to shortness of breath along with dry cough, wheezing, no chest pain, fever or chills. Placed on oxygen due to hypoxia but then weaned off oxygen. Discharged after 2 days.   She presents today for a follow-up visit with a chief complaint of   Cardioversion was cancelled as she converted to NSR.   Past Medical History:  Diagnosis Date   Allergy    allergic rhinitis   Anxiety    Arrhythmia    atrial fibrillation   Back pain    Chest pain    CHF (congestive heart failure) (HCC)    COPD (chronic obstructive pulmonary disease) (HCC)    Depression    Epigastric pain    Goiter    History of tobacco abuse    Hyperglycemia    mild   Hyperlipidemia    Hypertension    Insomnia    Hx of   Labile blood pressure    Panic disorder    History of   Personal history of colonic polyps 06/09/2004   SVD (spontaneous vaginal delivery)    x 2   Past Surgical History:  Procedure Laterality Date   ABDOMINAL HYSTERECTOMY N/A 09/24/2012   Procedure: HYSTERECTOMY ABDOMINAL;  Surgeon: Gus Height, MD;  Location: [facility];  Service: Gynecology;  Laterality: N/A;   BREAST BIOPSY     CARDIOVERSION N/A 02/09/2022   Procedure: CARDIOVERSION;  Surgeon: Minna Merritts, MD;  Location: [facility];  Service: Cardiovascular;  Laterality: N/A;   CARDIOVERSION N/A 03/02/2022   Procedure: CARDIOVERSION;  Surgeon: Minna Merritts, MD;  Location: [facility];  Service: Cardiovascular;  Laterality: N/A;   COLONOSCOPY     COLONOSCOPY  2017   ELBOW SURGERY  2004   EYE SURGERY     bilateral lasik   SALPINGOOPHORECTOMY  Bilateral 09/24/2012   Procedure: SALPINGO OOPHORECTOMY;  Surgeon: Gus Height, MD;  Location: [facility];  Service: Gynecology;  Laterality: Bilateral;   THYROID SURGERY     B9 massess   WART FULGURATION N/A 09/24/2012   Procedure: FULGURATION VAGINAL WART;  Surgeon: Gus Height, MD;  Location: [facility];  Service: Gynecology;  Laterality: N/A;   WISDOM TOOTH EXTRACTION     Family History  Problem Relation Age of Onset   Hypertension Mother    Stroke Mother    Alcohol abuse Father    Cancer Father        lung CA   Heart disease Father        CHF   Cancer Sister        breast CA   Heart disease Sister        CHF from chemotx also has defib   Breast cancer Sister    Colon cancer Neg Hx    Esophageal cancer Neg Hx    Rectal cancer Neg Hx    Stomach cancer Neg Hx    Social History   Tobacco Use   Smoking status: Former    Packs/day: 1.00    Types: E-cigarettes, Cigarettes    Quit date: 2011    Years since quitting: 84.0  Smokeless tobacco: Never   Tobacco comments:    She is vaping, former smoker    Substance Use Topics   Alcohol use: No    Alcohol/week: 0.0 standard drinks of alcohol   Allergies  Allergen Reactions   Abilify [Aripiprazole]     Vision problem   Codeine Nausea And Vomiting    REACTION: Nausea and vomiting Pt has tolerated vicodin & percocet in the past   Cymbalta [Duloxetine Hcl]     More depressed/tearful    Lipitor [Atorvastatin]     Muscle pain    Wellbutrin [Bupropion]     Review of Systems  Constitutional:  Positive for fatigue. Negative for appetite change.  HENT:  Negative for congestion, postnasal drip and sore throat.   Eyes: Negative.   Respiratory:  Positive for shortness of breath and wheezing. Negative for cough and chest tightness.   Cardiovascular:  Negative for chest pain, palpitations and leg swelling.  Gastrointestinal:  Negative for abdominal distention and abdominal pain.  Endocrine: Negative.   Genitourinary: Negative.    Musculoskeletal:  Positive for back pain. Negative for neck pain.  Skin: Negative.   Allergic/Immunologic: Negative.   Neurological:  Negative for dizziness and light-headedness.  Hematological:  Negative for adenopathy. Bruises/bleeds easily.  Psychiatric/Behavioral:  Negative for dysphoric mood and sleep disturbance (sleeping on 2 pillow). The patient is nervous/anxious.      Physical Exam Vitals and nursing note reviewed. Exam conducted with a chaperone present (husband).  Constitutional:      Appearance: Normal appearance.  HENT:     Head: Normocephalic and atraumatic.  Cardiovascular:     Rate and Rhythm: Normal rate. Rhythm irregular.  Pulmonary:     Effort: Pulmonary effort is normal.     Breath sounds: No wheezing, rhonchi or rales.  Abdominal:     General: There is no distension.     Palpations: Abdomen is soft.     Tenderness: There is no abdominal tenderness.  Musculoskeletal:        General: No tenderness.     Cervical back: Normal range of motion and neck supple.     Right lower leg: No edema.     Left lower leg: No edema.  Skin:    General: Skin is warm and dry.  Neurological:     General: No focal deficit present.     Mental Status: She is alert and oriented to person, place, and time.  Psychiatric:        Mood and Affect: Mood normal.        Behavior: Behavior normal.        Thought Content: Thought content normal.   Assessment & Plan:  1: Chronic heart failure with preserved ejection fraction with structural changes (LVH)- - NYHA class II - euvolemic today - weighing daily; understands to call for an overnight weight gain of > 2 pounds or a weekly weight gain of > 5 pounds - weight 203.8 from last visit here 6 weeks ago - not adding salt and is using salt substitute and recently got Mrs. Dash to use - discussed adding SGLT2/ entresto at next visit but will defer to after her cardioversion next week - BNP 02/13/22 was 128.9  2: HTN- - BP  - saw PCP  ([facility]) 02/16/22 - BMP 02/21/22 reviewed and showed sodium 138, potassium 3.4, creatinine 0.8 & GFR >60  3: Atrial fibrillation- - saw cardiology ([facility]) 02/21/22; returns 04/17/22 - cardioversion was cancelled because she converted to NSR  4: COPD- -  saw pulmonology Patsey Berthold) 02/28/22   Med list reviewed.

## 2022-04-10 ENCOUNTER — Encounter: Payer: Medicare Other | Admitting: Physical Therapy

## 2022-04-11 DIAGNOSIS — F332 Major depressive disorder, recurrent severe without psychotic features: Secondary | ICD-10-CM | POA: Diagnosis not present

## 2022-04-11 DIAGNOSIS — F5101 Primary insomnia: Secondary | ICD-10-CM | POA: Diagnosis not present

## 2022-04-11 DIAGNOSIS — F411 Generalized anxiety disorder: Secondary | ICD-10-CM | POA: Diagnosis not present

## 2022-04-12 ENCOUNTER — Encounter: Payer: Medicare Other | Admitting: Physical Therapy

## 2022-04-15 ENCOUNTER — Other Ambulatory Visit: Payer: Self-pay | Admitting: Cardiovascular Disease

## 2022-04-16 NOTE — Progress Notes (Signed)
Cardiology Office Note  Date:  04/17/2022   ID:  Anita, Harrell 18-Jul-1950, MRN [id number]  PCP:  Abner Greenspan, MD   Chief Complaint  Patient presents with   Follow-up    Patient c/o shortness of breath with walking a short distance. Medications reviewed by the patient verbally.     HPI:  Anita Harrell is a 72 year old woman with past medical history of Hypertension Depression/anxiety Vapor cigarettes/former smoker 20 years Chronic shortness of breath/smoker's cough Atrial fibrillation noted on visit with pulmonary Who presents for follow-up of her paroxysmal atrial fibrillation  Last seen by myself in clinic November 2023 In atrial fibrillation on that visit, asymptomatic in clinic May 2023, was in atrial fibrillation, new onset Seen by one of our providers August 31, 2021, was in NSR  admitted to the hospital from 12/4 through 12/6 with acute respiratory failure with hypoxia in the setting of COPD exacerbation and acute on chronic HFpEF.   Echo showed an EF of 50 to 55%, no regional wall motion abnormalities,  normal PASP, treated with steroids, nebulizer therapy, and diuresis with symptomatic improvement.   Seen in clinic after hospital discharge February 21, 2022, in atrial fibrillation at that time Was started on diltiazem ER 120 with metoprolol succinate 50 twice daily and flecainide 100 twice daily Scheduled for cardioversion but this was canceled March 02, 2022 as she was back in normal sinus rhythm  In follow-up today reports she is sedentary, no regular exercise program Continue to note shortness of breath on exertion Has a recumbent bike at home but does not use it  Maintaining normal sinus rhythm on today's visit  EKG personally reviewed by myself on todays visit Normal sinus rhythm rate 73 bpm no significant ST-T wave changes  In follow-up today she reports that she continues to have shortness of breath on exertion Weight gain, previously 200  pounds now 209 Eating more but also reports having some leg swelling, possibly abdominal distention from fluid retention  Past medical history reviewed Echocardiogram August 30, 2021 . Left ventricular ejection fraction, by estimation, is 55 to 60%. The  left ventricle has normal function. The left ventricle has no regional  wall motion abnormalities. Left ventricular diastolic parameters are  indeterminate.   2. Right ventricular systolic function is normal. The right ventricular  size is normal. Tricuspid regurgitation signal is inadequate for assessing  PA pressure.   3. Left atrial size was moderately dilated.   4. The mitral valve is normal in structure. Mild mitral valve  regurgitation. No evidence of mitral stenosis.   5. The aortic valve is normal in structure. Aortic valve regurgitation is  not visualized. No aortic stenosis is present.   6. The inferior vena cava is normal in size with greater than 50%  respiratory variability, suggesting right atrial pressure of 3 mmHg.   PMH:   has a past medical history of Allergy, Anxiety, Arrhythmia, Back pain, Chest pain, CHF (congestive heart failure) (HCC), COPD (chronic obstructive pulmonary disease) ([place]), Depression, Epigastric pain, Goiter, History of tobacco abuse, Hyperglycemia, Hyperlipidemia, Hypertension, Insomnia, Labile blood pressure, Panic disorder, Personal history of colonic polyps (06/09/2004), and SVD (spontaneous vaginal delivery).  PSH:    Past Surgical History:  Procedure Laterality Date   ABDOMINAL HYSTERECTOMY N/A 09/24/2012   Procedure: HYSTERECTOMY ABDOMINAL;  Surgeon: Gus Height, MD;  Location: [facility];  Service: Gynecology;  Laterality: N/A;   BREAST BIOPSY     CARDIOVERSION N/A 02/09/2022   Procedure: CARDIOVERSION;  Surgeon: Minna Merritts, MD;  Location: [facility];  Service: Cardiovascular;  Laterality: N/A;   CARDIOVERSION N/A 03/02/2022   Procedure: CARDIOVERSION;  Surgeon: Minna Merritts, MD;   Location: [facility];  Service: Cardiovascular;  Laterality: N/A;   COLONOSCOPY     COLONOSCOPY  2017   ELBOW SURGERY  2004   EYE SURGERY     bilateral lasik   SALPINGOOPHORECTOMY Bilateral 09/24/2012   Procedure: SALPINGO OOPHORECTOMY;  Surgeon: Gus Height, MD;  Location: [facility];  Service: Gynecology;  Laterality: Bilateral;   THYROID SURGERY     B9 massess   WART FULGURATION N/A 09/24/2012   Procedure: FULGURATION VAGINAL WART;  Surgeon: Gus Height, MD;  Location: [facility];  Service: Gynecology;  Laterality: N/A;   WISDOM TOOTH EXTRACTION      Current Outpatient Medications  Medication Sig Dispense Refill   albuterol (VENTOLIN HFA) 108 (90 Base) MCG/ACT inhaler Inhale 2 puffs into the lungs every 6 (six) hours as needed for wheezing or shortness of breath. 8 g 2   apixaban (ELIQUIS) 5 MG TABS tablet TAKE 1 TABLET BY MOUTH TWICE A DAY 60 tablet 5   busPIRone (BUSPAR) 30 MG tablet TAKE ONE HALF TO 1 TABLET BY MOUTH 3 TIMES DAILY AS DIRECTED 270 tablet 1   cyclobenzaprine (FLEXERIL) 10 MG tablet Take 10 mg by mouth 3 (three) times daily as needed for muscle spasms. 0.5-1 tablets     diltiazem (CARDIZEM CD) 120 MG 24 hr capsule Take 1 capsule (120 mg total) by mouth daily. 90 capsule 3   DULoxetine (CYMBALTA) 60 MG capsule Take 60 mg by mouth daily.     flecainide (TAMBOCOR) 100 MG tablet Take 1 tablet (100 mg total) by mouth 2 (two) times daily. 180 tablet 3   Fluticasone-Umeclidin-Vilant (TRELEGY ELLIPTA) 100-62.5-25 MCG/ACT AEPB Inhale 1 puff into the lungs daily. 28 each 11   furosemide (LASIX) 20 MG tablet Take 1 tablet (20 mg total) by mouth daily. 90 tablet 3   hydrOXYzine (VISTARIL) 25 MG capsule Take 25 mg by mouth 2 (two) times daily.     metoprolol succinate (TOPROL-XL) 50 MG 24 hr tablet TAKE 1 TABLET BY MOUTH TWICE A DAY WITH MEALS 180 tablet 1   PARoxetine HCl (PAXIL PO) Take 1 tablet by mouth daily.     potassium chloride (KLOR-CON) 10 MEQ tablet Take 1 tablet (10 mEq total) by  mouth daily. 90 tablet 3   rosuvastatin (CRESTOR) 10 MG tablet TAKE 1 TABLET BY MOUTH ONCE A DAY 90 tablet 3   solifenacin (VESICARE) 10 MG tablet TAKE 1 TABLET BY MOUTH ONCE A DAY 90 tablet 1   traMADol (ULTRAM) 50 MG tablet TAKE 1-2 TABLETS BY MOUTH EVERY 8 HOURS AS NEEDED FOR MODERATE PAIN 90 tablet 0   zolpidem (AMBIEN CR) 12.5 MG CR tablet TAKE 1 TABLET BY MOUTH ONCE DAILY AT BEDTIME AS NEEDED FOR SLEEP 90 tablet 0   Fluticasone-Umeclidin-Vilant (TRELEGY ELLIPTA) 100-62.5-25 MCG/ACT AEPB Inhale 1 puff into the lungs daily. (Patient not taking: Reported on 04/17/2022) 14 each 0   No current facility-administered medications for this visit.    Allergies:   Abilify [aripiprazole], Codeine, Cymbalta [duloxetine hcl], Lipitor [atorvastatin], and Wellbutrin [bupropion]   Social History:  The patient  reports that she quit smoking about 13 years ago. Her smoking use included e-cigarettes and cigarettes. She smoked an average of 1 pack per day. She has never used smokeless tobacco. She reports that she does not drink alcohol and does  not use drugs.   Family History:   family history includes Alcohol abuse in her father; Breast cancer in her sister; Cancer in her father and sister; Heart disease in her father and sister; Hypertension in her mother; Stroke in her mother.    Review of Systems: Review of Systems  Constitutional: Negative.   HENT: Negative.    Respiratory:  Positive for shortness of breath.   Cardiovascular: Negative.   Gastrointestinal: Negative.   Musculoskeletal: Negative.   Neurological: Negative.   Psychiatric/Behavioral: Negative.    All other systems reviewed and are negative.   PHYSICAL EXAM: VS:  BP 110/70 (BP Location: Left Arm, Patient Position: Sitting, Cuff Size: Normal)   Pulse 73   Ht '5\' 3"'$  (1.6 m)   Wt 208 lb 4 oz (94.5 kg)   SpO2 93%   BMI 36.89 kg/m  , BMI Body mass index is 36.89 kg/m. Constitutional:  oriented to person, place, and time. No distress.   HENT:  Head: Grossly normal Eyes:  no discharge. No scleral icterus.  Neck: No JVD, no carotid bruits  Cardiovascular: Regular rate and rhythm, no murmurs appreciated Pulmonary/Chest: Clear to auscultation bilaterally, no wheezes or rails Abdominal: Soft.  no distension.  no tenderness.  Musculoskeletal: Normal range of motion Neurological:  normal muscle tone. Coordination normal. No atrophy Skin: Skin warm and dry Psychiatric: normal affect, pleasant  Recent Labs: 06/07/2021: ALT 17; TSH 2.14 02/13/2022: B Natriuretic Peptide 128.9 02/14/2022: Magnesium 1.9 02/21/2022: BUN 15; Creatinine, Ser 0.80; Hemoglobin 15.3; Platelets 150; Potassium 3.4; Sodium 138    Lipid Panel Lab Results  Component Value Date   CHOL 118 06/07/2021   HDL 41.30 06/07/2021   LDLCALC 37 06/07/2021   TRIG 197.0 (H) 06/07/2021    Wt Readings from Last 3 Encounters:  04/17/22 208 lb 4 oz (94.5 kg)  03/02/22 200 lb (90.7 kg)  02/28/22 203 lb 12.8 oz (92.4 kg)     ASSESSMENT AND PLAN:  Problem List Items Addressed This Visit       Cardiology Problems   Hyperlipidemia   Other Visit Diagnoses     Chronic heart failure with preserved ejection fraction (HCC)    -  Primary   Persistent atrial fibrillation (HCC)       Chronic obstructive pulmonary disease, unspecified COPD type ([place])       Essential hypertension       Generalized weakness       SOB (shortness of breath)         Paroxysmal atrial fibrillation with RVR on Eliquis, metoprolol succinate 50 daily, flecainide 100 twice daily and diltiazem ER Previously set up 2 cardioversions but she converted before procedures performed Today maintaining normal sinus rhythm No medication changes made, recommended pulse oximeter to monitor heart rhythm at home For rate 90 and higher sustained probably in atrial fibrillation  Shortness of breath Reports chronic mild shortness of breath Prior smoking history, deconditioning Recommend Lasix 20 daily  with potassium 20 daily  Anxiety Reports doing well on BuSpar   Total encounter time more than 30 minutes  Greater than 50% was spent in counseling and coordination of care with the patient    Signed, Esmond Plants, M.D., Ph.D. [place], [place]

## 2022-04-17 ENCOUNTER — Encounter: Payer: Medicare Other | Admitting: Physical Therapy

## 2022-04-17 ENCOUNTER — Encounter: Payer: Self-pay | Admitting: Cardiovascular Disease

## 2022-04-17 ENCOUNTER — Other Ambulatory Visit: Payer: Self-pay | Admitting: Family Medicine

## 2022-04-17 ENCOUNTER — Ambulatory Visit: Payer: Medicare Other | Attending: Physician Assistant | Admitting: Cardiovascular Disease

## 2022-04-17 VITALS — BP 110/70 | HR 73 | Ht 63.0 in | Wt 208.2 lb

## 2022-04-17 DIAGNOSIS — J449 Chronic obstructive pulmonary disease, unspecified: Secondary | ICD-10-CM | POA: Diagnosis not present

## 2022-04-17 DIAGNOSIS — I4819 Other persistent atrial fibrillation: Secondary | ICD-10-CM

## 2022-04-17 DIAGNOSIS — R531 Weakness: Secondary | ICD-10-CM | POA: Insufficient documentation

## 2022-04-17 DIAGNOSIS — I10 Essential (primary) hypertension: Secondary | ICD-10-CM | POA: Diagnosis not present

## 2022-04-17 DIAGNOSIS — R0602 Shortness of breath: Secondary | ICD-10-CM | POA: Insufficient documentation

## 2022-04-17 DIAGNOSIS — E782 Mixed hyperlipidemia: Secondary | ICD-10-CM | POA: Diagnosis not present

## 2022-04-17 DIAGNOSIS — I5032 Chronic diastolic (congestive) heart failure: Secondary | ICD-10-CM

## 2022-04-17 MED ORDER — POTASSIUM CHLORIDE ER 10 MEQ PO TBCR: 20.0000 meq | EXTENDED_RELEASE_TABLET | Freq: Every day | ORAL | 3 refills | Status: DC

## 2022-04-17 MED ORDER — METOPROLOL SUCCINATE ER 50 MG PO TB24: ORAL_TABLET | ORAL | 1 refills | Status: DC

## 2022-04-17 NOTE — Telephone Encounter (Signed)
Last time we talked I urged pt to discuss ambien with her psychiatrist as I thought it increased her fall risk significantly  Did they discuss this ? / other options?

## 2022-04-17 NOTE — Patient Instructions (Addendum)
Medication Instructions:  Please increase the potassium up to 20 meq daily  If you need a refill on your cardiac medications before your next appointment, please call your pharmacy.   Lab work: No new labs needed  Testing/Procedures: No new testing needed  Follow-Up: At [facility], you and your health needs are our priority.  As part of our continuing mission to provide you with exceptional heart care, we have created designated Provider Care Teams.  These Care Teams include your primary Cardiologist (physician) and Advanced Practice Providers (APPs -  Physician Assistants and Nurse Practitioners) who all work together to provide you with the care you need, when you need it.  You will need a follow up appointment in 6 months  Providers on your designated Care Team:   Murray Hodgkins, NP Christell Faith, PA-C Cadence Kathlen Mody, [place]  COVID-19 Vaccine Information can be found at: [email or web address].uk For questions related to vaccine distribution or appointments, please email [email or web address] or call [phone].

## 2022-04-17 NOTE — Telephone Encounter (Signed)
Prescription refill request for Eliquis received. Indication: AF Last office visit: 02/24/22  Rolena Infante FNP Scr: 0.80 on 02/21/22  Epic Age: 72 Weight: 92.3kg  Based on above findings Eliquis '5mg'$  twice daily is the appropriate dose.  Refill approved.

## 2022-04-17 NOTE — Telephone Encounter (Signed)
Refill request Ambien Last refill 12/19/21 #90 Last office visit 02/16/22

## 2022-04-18 NOTE — Telephone Encounter (Signed)
I sent in 10 to get her through until her appt

## 2022-04-18 NOTE — Telephone Encounter (Signed)
Pt said she forgot to discuss this. Pt said she does have an appt next week with them and will try and remember to ask her. Pt did ask PCP to fill med until appt because she is out of med, please advise

## 2022-04-19 ENCOUNTER — Encounter: Payer: Medicare Other | Admitting: Physical Therapy

## 2022-04-24 ENCOUNTER — Encounter: Payer: Medicare Other | Admitting: Physical Therapy

## 2022-04-25 DIAGNOSIS — F411 Generalized anxiety disorder: Secondary | ICD-10-CM | POA: Diagnosis not present

## 2022-04-25 DIAGNOSIS — F5101 Primary insomnia: Secondary | ICD-10-CM | POA: Diagnosis not present

## 2022-04-25 DIAGNOSIS — F332 Major depressive disorder, recurrent severe without psychotic features: Secondary | ICD-10-CM | POA: Diagnosis not present

## 2022-04-26 ENCOUNTER — Encounter: Payer: Medicare Other | Admitting: Physical Therapy

## 2022-05-01 ENCOUNTER — Encounter: Payer: Medicare Other | Admitting: Physical Therapy

## 2022-05-03 ENCOUNTER — Encounter: Payer: Medicare Other | Admitting: Physical Therapy

## 2022-05-08 ENCOUNTER — Encounter: Payer: Medicare Other | Admitting: Physical Therapy

## 2022-05-09 DIAGNOSIS — F5101 Primary insomnia: Secondary | ICD-10-CM | POA: Diagnosis not present

## 2022-05-09 DIAGNOSIS — F03A3 Unspecified dementia, mild, with mood disturbance: Secondary | ICD-10-CM | POA: Diagnosis not present

## 2022-05-09 DIAGNOSIS — F411 Generalized anxiety disorder: Secondary | ICD-10-CM | POA: Diagnosis not present

## 2022-05-09 DIAGNOSIS — F332 Major depressive disorder, recurrent severe without psychotic features: Secondary | ICD-10-CM | POA: Diagnosis not present

## 2022-05-10 ENCOUNTER — Encounter: Payer: Medicare Other | Admitting: Physical Therapy

## 2022-05-17 ENCOUNTER — Ambulatory Visit (INDEPENDENT_AMBULATORY_CARE_PROVIDER_SITE_OTHER): Payer: Medicare Other | Admitting: Family Medicine

## 2022-05-17 ENCOUNTER — Encounter: Payer: Self-pay | Admitting: Family Medicine

## 2022-05-17 VITALS — BP 128/70 | HR 70 | Temp 97.7°F | Ht 63.0 in | Wt 207.0 lb

## 2022-05-17 DIAGNOSIS — F5104 Psychophysiologic insomnia: Secondary | ICD-10-CM

## 2022-05-17 DIAGNOSIS — D72821 Monocytosis (symptomatic): Secondary | ICD-10-CM | POA: Diagnosis not present

## 2022-05-17 DIAGNOSIS — F05 Delirium due to known physiological condition: Secondary | ICD-10-CM

## 2022-05-17 DIAGNOSIS — Z79899 Other long term (current) drug therapy: Secondary | ICD-10-CM

## 2022-05-17 DIAGNOSIS — R443 Hallucinations, unspecified: Secondary | ICD-10-CM | POA: Diagnosis not present

## 2022-05-17 DIAGNOSIS — F410 Panic disorder [episodic paroxysmal anxiety] without agoraphobia: Secondary | ICD-10-CM | POA: Diagnosis not present

## 2022-05-17 DIAGNOSIS — F418 Other specified anxiety disorders: Secondary | ICD-10-CM | POA: Diagnosis not present

## 2022-05-17 NOTE — Assessment & Plan Note (Addendum)
Visual, audio and some paranoia  For years but worse lately Only at night  H/o sleep walking   Diff incl anx/dep , early dementia, med side eff  Long disc/med rev F/u psychiatry F/u next wk for mmse and lab Neuro ref At f/u will also discuss pharmacy ref for med review

## 2022-05-17 NOTE — Patient Instructions (Addendum)
Call your psychiatrist  Tell them your anxiety and some hallucinations and paranoia are worse  See if they want to change anything before your appt tues   But also keep appt for Tuesday   I am going to put in a neurology referral  I put the referral in  Please let us know if you don't hear in 1-2 weeks   Follow up with me next week  We will do a memory screening test and discuss some labs   For now hold the tramadol and hold the flexeril (the pain pill and the muscle relaxer)

## 2022-05-17 NOTE — Assessment & Plan Note (Addendum)
Long discussion re: current tx from psychiatry along with worsened anxiety /panic/ and hallucinations (also paranoia) the past 3 nights  Stopped ambien In process of stopping paxil  Med review done and plans to correspond with psychiatry soon  Disc poss of some neuro cognitive change as well   Now taking Cymbalta 60 mg daily  Remeron 15 mg qhs Hydroxyzine for emergencies only  Buspar 30 mg bid   40  Minutes were spent today both face to face and in the chart obtaining history, reviewing records and test results, performing exam , educating and discussing treatment options, and placing orders

## 2022-05-17 NOTE — Assessment & Plan Note (Signed)
Worse recently getting off paxil and ambien   Plans to reach out to psychiatrist
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
## 2022-05-17 NOTE — Assessment & Plan Note (Signed)
Off ambien On remeron  Some sundowning behavior  Disc holding un needed medications Will f/u with psychiatry

## 2022-05-17 NOTE — Assessment & Plan Note (Signed)
Will re check this at f/u next wk

## 2022-05-17 NOTE — Assessment & Plan Note (Signed)
The following night time symptoms : Anixety /panic Insomnia Auditory and visual hallucinations One episode of going outside and calling police   Disc role of anx/dep, medications and also possible neuro cog changes  Ref made to neuro She will call psychiatry today and f/u tues for med management  F/u here for mmse (? If did poorly on that at psych) as well as labs Discussed safety  Excellent care from son  Today she is mentally sharp and pleasant   Inst to hold tramadol and flexeril until further notice Continue to hold [place]

## 2022-05-17 NOTE — Assessment & Plan Note (Signed)
Any psychiatric meds and cardiac as well  Flexeril and tramadol for pain   Some sundowning and hallucinations  Will ref to pharmacy for med review

## 2022-05-18 ENCOUNTER — Ambulatory Visit (INDEPENDENT_AMBULATORY_CARE_PROVIDER_SITE_OTHER): Payer: Medicare Other

## 2022-05-18 ENCOUNTER — Telehealth: Payer: Self-pay

## 2022-05-18 VITALS — Ht 63.0 in | Wt 207.0 lb

## 2022-05-18 DIAGNOSIS — Z0000 Encounter for general adult medical examination without abnormal findings: Secondary | ICD-10-CM | POA: Diagnosis not present

## 2022-05-18 NOTE — Progress Notes (Signed)
Care Management & Coordination Services Pharmacy Team  Reason for Encounter: Appointment Reminder  Contacted patient to confirm in office appointment with Charlene Brooke , PharmD on 05/19/22 at 10:00- appointment just made   Have you seen any other providers since your last visit with PCP? No   Chart review:  Recent office visits:  05/17/22-Marne Tower,MD(PCP)-sundowning,Inst to hold tramadol and flexeril until further notice Continue to hold ambien-Will ref to pharmacy for med review-referral to neurology,f/u psychiatry. 02/16/22-Marne Tower,MD(PCP)-hospital f/u,referral for PT,discussed stopping ambien 01/26/22-Spencer Copland,MD(fam med)-back pain,xray, short course prednisone,start tramadol '50mg'$  1-2 tabs.every 8 hours as needed or pain 12/26/21-Marne Tower,MD(PCP)-depression,f/u psychiatry 11/30/21-Spencer Copland,MD(fam med)-chronic back pain,cortisone injection, PT referral,  Recent consult visits:  04/17/22-Timothy Gollan,MD(cardio)-f/u heart failure,increase the potassium up to 20 meq daily f/u 6 months 02/28/22-Carmen Gonzalez,MD(pulmo)-switching your inhaler to Trelegy Ellipta 1 puff daily. F/u 3 months 02/24/22-Tina Hackneu,FNP-[facility] failure [facility]-decrease lasix to '20mg'$  daily(1/2 tab) increase potassium 21mq twice a day for 3 days then decrease to 114m daily  f/u 6 weeks 02/21/22-Ryan Dunn,PA(cardio)-hospital f/u,Afib,labs START Diltiazem CD 120 mg once daily . 02/01/22-Timothy Gollan,MD(cardio)-f/u Afib, schedule cardioversion,add Lasix '20mg'$  daily and potassium 10 for weight gain  12/12/21-Timothy Gollan,MD(cardio)-f/u Afib,EKG,Recommend she add flecainide 100 twice daily Flecainide treadmill in 2 weeks,ordered a Zio monitor 11/29/21-Carmen Gonzales,MD(pulmo)-f/u shortness of breath,RX for albuterol inhaler for as needed,f/u 3 months     Hospital visits:  03/02/22- Timothy Gollan,MD([facility])-cardioversion -no admission  02/13/22 thru 02/15/22-[facility]- acute respiratory failure-  solu-medrol and nebulizer treatments- discharged home  02/09/22-Timothy Gollan,MD(cardio)-[facility] -cardioversion no admission   Star Rating Drugs:  Medication:  Last Fill: Day Supply Rosuvastatin '10mg'$  04/06/22 90   Care Gaps: Annual wellness visit in last year? Yes  LiCharlene BrookePharmD notified  [facility]ensor[facility]istant [phone]

## 2022-05-18 NOTE — Progress Notes (Signed)
I connected with  Anita Harrell on 05/18/22 by a audio enabled telemedicine application and verified that I am speaking with the correct person using two identifiers.  Patient Location: Home  Provider Location: Home Office  I discussed the limitations of evaluation and management by telemedicine. The patient expressed understanding and agreed to proceed.  Subjective:   Anita Harrell is a 72 y.o. female who presents for Medicare Annual (Subsequent) preventive examination.  Review of Systems      Cardiac Risk Factors include: advanced age (>55mn, >>84women);hypertension;sedentary lifestyle     Objective:    Today's Vitals   05/18/22 1320  Weight: 207 lb (93.9 kg)  Height: '5\' 3"'$  (1.6 m)  PainSc: 0-No pain   Body mass index is 36.67 kg/m.     05/18/2022    1:26 PM 02/14/2022    2:07 AM 02/13/2022    5:43 AM 02/09/2022    7:06 AM 05/16/2021    1:16 PM 05/14/2020    1:15 PM 01/29/2020    3:49 AM  Advanced Directives  Does Patient Have a Medical Advance Directive? Yes No;Yes No No Yes Yes No  Type of [place] will   Living [place]   Does patient want to make changes to medical advance directive?  No - Patient declined   Yes (MAU/Ambulatory/Procedural Areas - Information given)    Copy of [place] Chart? No - copy requested     No - copy requested   Would patient like information on creating a medical advance directive?  No - Patient declined  Yes (ED - Information included in AVS)   No - Patient declined    Current Medications (verified) Outpatient Encounter Medications as of 05/18/2022  Medication Sig   albuterol (VENTOLIN HFA) 108 (90 Base) MCG/ACT inhaler Inhale 2 puffs into the lungs every 6 (six) hours as needed for wheezing or shortness of breath.   apixaban (ELIQUIS) 5 MG TABS tablet TAKE 1 TABLET BY MOUTH TWICE A DAY   busPIRone (BUSPAR) 30 MG tablet  TAKE ONE HALF TO 1 TABLET BY MOUTH 3 TIMES DAILY AS DIRECTED   diltiazem (CARDIZEM CD) 120 MG 24 hr capsule Take 1 capsule (120 mg total) by mouth daily.   DULoxetine (CYMBALTA) 60 MG capsule Take 60 mg by mouth daily.   flecainide (TAMBOCOR) 100 MG tablet Take 1 tablet (100 mg total) by mouth 2 (two) times daily.   Fluticasone-Umeclidin-Vilant (TRELEGY ELLIPTA) 100-62.5-25 MCG/ACT AEPB Inhale 1 puff into the lungs daily.   Fluticasone-Umeclidin-Vilant (TRELEGY ELLIPTA) 100-62.5-25 MCG/ACT AEPB Inhale 1 puff into the lungs daily.   furosemide (LASIX) 20 MG tablet Take 1 tablet (20 mg total) by mouth daily.   hydrOXYzine (VISTARIL) 25 MG capsule Take 25 mg by mouth 2 (two) times daily.   metoprolol succinate (TOPROL-XL) 50 MG 24 hr tablet TAKE 1 TABLET BY MOUTH TWICE A DAY WITH MEALS   mirtazapine (REMERON) 15 MG tablet Take 15 mg by mouth at bedtime.   PARoxetine HCl (PAXIL PO) Take 1 tablet by mouth daily. '5mg'$  daily. Trying to wean off of it.   potassium chloride (KLOR-CON) 10 MEQ tablet Take 2 tablets (20 mEq total) by mouth daily.   rosuvastatin (CRESTOR) 10 MG tablet TAKE 1 TABLET BY MOUTH ONCE A DAY   solifenacin (VESICARE) 10 MG tablet TAKE 1 TABLET BY MOUTH ONCE A DAY   cyclobenzaprine (FLEXERIL) 10 MG tablet Take 10  mg by mouth 3 (three) times daily as needed for muscle spasms. 0.5-1 tablets (Patient not taking: Reported on 05/17/2022)   traMADol (ULTRAM) 50 MG tablet TAKE 1-2 TABLETS BY MOUTH EVERY 8 HOURS AS NEEDED FOR MODERATE PAIN (Patient not taking: Reported on 05/17/2022)   No facility-administered encounter medications on file as of 05/18/2022.    Allergies (verified) Abilify [aripiprazole], Codeine, Cymbalta [duloxetine hcl], Lipitor [atorvastatin], and Wellbutrin [bupropion]   History: Past Medical History:  Diagnosis Date   Allergy    allergic rhinitis   Anxiety    Arrhythmia    atrial fibrillation   Back pain    Chest pain    CHF (congestive heart failure) (HCC)     COPD (chronic obstructive pulmonary disease) (HCC)    Depression    Epigastric pain    Goiter    History of tobacco abuse    Hyperglycemia    mild   Hyperlipidemia    Hypertension    Insomnia    Hx of   Labile blood pressure    Panic disorder    History of   Personal history of colonic polyps 06/09/2004   SVD (spontaneous vaginal delivery)    x 2   Past Surgical History:  Procedure Laterality Date   ABDOMINAL HYSTERECTOMY N/A 09/24/2012   Procedure: HYSTERECTOMY ABDOMINAL;  Surgeon: Gus Height, MD;  Location: [facility];  Service: Gynecology;  Laterality: N/A;   BREAST BIOPSY     CARDIOVERSION N/A 02/09/2022   Procedure: CARDIOVERSION;  Surgeon: Minna Merritts, MD;  Location: [facility];  Service: Cardiovascular;  Laterality: N/A;   CARDIOVERSION N/A 03/02/2022   Procedure: CARDIOVERSION;  Surgeon: Minna Merritts, MD;  Location: [facility];  Service: Cardiovascular;  Laterality: N/A;   COLONOSCOPY     COLONOSCOPY  2017   ELBOW SURGERY  2004   EYE SURGERY     bilateral lasik   SALPINGOOPHORECTOMY Bilateral 09/24/2012   Procedure: SALPINGO OOPHORECTOMY;  Surgeon: Gus Height, MD;  Location: [facility];  Service: Gynecology;  Laterality: Bilateral;   THYROID SURGERY     B9 massess   WART FULGURATION N/A 09/24/2012   Procedure: FULGURATION VAGINAL WART;  Surgeon: Gus Height, MD;  Location: [facility];  Service: Gynecology;  Laterality: N/A;   WISDOM TOOTH EXTRACTION     Family History  Problem Relation Age of Onset   Hypertension Mother    Stroke Mother    Alcohol abuse Father    Cancer Father        lung CA   Heart disease Father        CHF   Cancer Sister        breast CA   Heart disease Sister        CHF from chemotx also has defib   Breast cancer Sister    Colon cancer Neg Hx    Esophageal cancer Neg Hx    Rectal cancer Neg Hx    Stomach cancer Neg Hx    Social History   Socioeconomic History   Marital status: Married    Spouse name: Not on file   Number of children:  Not on file   Years of education: Not on file   Highest education level: Not on file  Occupational History   Not on file  Tobacco Use   Smoking status: Former    Packs/day: 1.00    Types: E-cigarettes, Cigarettes    Quit date: 2011    Years since quitting: 13.1   Smokeless tobacco:  Never   Tobacco comments:    She is vaping, former smoker    Vaping Use   Vaping Use: Every day  Substance and Sexual Activity   Alcohol use: No    Alcohol/week: 0.0 standard drinks of alcohol   Drug use: No   Sexual activity: Yes    Birth control/protection: Post-menopausal  Other Topics Concern   Not on file  Social History Narrative   Lives with husband and 2 pets; dogs.   Social Determinants of Health   Financial Resource Strain: Low Risk  (05/18/2022)   Overall Financial Resource Strain (CARDIA)    Difficulty of Paying Living Expenses: Not hard at all  Food Insecurity: No Food Insecurity (05/18/2022)   Hunger Vital Sign    Worried About Running Out of Food in the Last Year: Never true    Ran Out of Food in the Last Year: Never true  Transportation Needs: No Transportation Needs (05/18/2022)   PRAPARE - Hydrologist (Medical): No    Lack of Transportation (Non-Medical): No  Physical Activity: Inactive (05/18/2022)   Exercise Vital Sign    Days of Exercise per Week: 0 days    Minutes of Exercise per Session: 0 min  Stress: Stress Concern Present (05/18/2022)   [place]    Feeling of Stress : To some extent  Social Connections: Moderately Integrated (05/18/2022)   Social Connection and Isolation Panel [NHANES]    Frequency of Communication with Friends and Family: More than three times a week    Frequency of Social Gatherings with Friends and Family: More than three times a week    Attends Religious Services: More than 4 times per year    Active Member of [employer] or Organizations: No    Attends Programme researcher, broadcasting/film/video: Never    Marital Status: Married    Tobacco Counseling Counseling given: Not Answered Tobacco comments: She is vaping, former smoker     Clinical Intake:  Pre-visit preparation completed: Yes  Pain : No/denies pain Pain Score: 0-No pain     Nutritional Risks: None Diabetes: No  How often do you need to have someone help you when you read instructions, pamphlets, or other written materials from your doctor or pharmacy?: 1 - Never  Diabetic? no  Interpreter Needed?: No  Information entered by :: C.Saiquan Hands LPN   Activities of Daily Living    05/18/2022    1:29 PM 03/02/2022    7:07 AM  In your present state of health, do you have any difficulty performing the following activities:  Hearing? 0 0  Vision? 1 0  Comment looking for eye doctor.   Difficulty concentrating or making decisions? 1 0  Comment occasionally has short term memery issues   Walking or climbing stairs? 1 0  Comment Back issue   Dressing or bathing? 0 0  Doing errands, shopping? 1   Comment Husband transports   Conservation officer, nature and eating ? N   Using the Toilet? N   In the past six months, have you accidently leaked urine? N   Do you have problems with loss of bowel control? N   Managing your Medications? N   Managing your Finances? N   Housekeeping or managing your Housekeeping? N     Patient Care Team: Tower, Wynelle Fanny, MD as PCP - General (Family Medicine) Rockey Situ Kathlene November, MD as PCP - Cardiology (Cardiology) Vania Rea, MD as Consulting Physician (  Obstetrics and Gynecology) Debbora Dus, [facility] as Pharmacist (Pharmacist) Tyler Pita, MD as Consulting Physician (Pulmonary Disease)  Indicate any recent Medical Services you may have received from other than [facility] providers in the past year (date may be approximate).     Assessment:   This is a routine wellness examination for Anita Harrell.  Hearing/Vision screen Hearing Screening - Comments:: No aids Vision  Screening - Comments:: Wears readers  Dietary issues and exercise activities discussed: Current Exercise Habits: The patient does not participate in regular exercise at present, Exercise limited by: orthopedic condition(s) (back issue under orthopedic care.)   Goals Addressed             This Visit's Progress    Patient Stated       Trying to get off some medications.       Depression Screen    05/18/2022    1:25 PM 05/17/2022   11:52 AM 05/16/2021    1:19 PM 05/14/2020    1:18 PM 10/17/2019   10:28 AM 01/16/2019   12:13 PM 12/25/2017   12:20 PM  PHQ 2/9 Scores  PHQ - 2 Score 0 '4 1 4 2 '$ 0 0  PHQ- 9 Score 0 '19  6 10      '$ Fall Risk    05/18/2022    1:27 PM 10/26/2021   10:51 AM 10/05/2021    9:21 AM 05/16/2021    1:17 PM 05/14/2020    1:16 PM  Fall Risk   Falls in the past year? 1 0 0 1 1  Comment     tripped and fell  Number falls in past yr: 1   0 0  Comment mainly at night due to [place] per pt      Injury with Fall? 0   0 0  Risk for fall due to : Impaired balance/gait   Other (Comment) Medication side effect  Risk for fall due to: Comment per patient, has not fallen since stopping ambien.   tripped   Follow up Falls evaluation completed;Falls prevention discussed;Education provided Falls evaluation completed Falls evaluation completed Falls prevention discussed Falls evaluation completed;Falls prevention discussed    FALL RISK PREVENTION PERTAINING TO THE HOME:  Any stairs in or around the home? Yes  If so, are there any without handrails? No  Home free of loose throw rugs in walkways, pet beds, electrical cords, etc? Yes  Adequate lighting in your home to reduce risk of falls? Yes   ASSISTIVE DEVICES UTILIZED TO PREVENT FALLS:  Life alert? No  Use of a cane, walker or w/c? Yes  Grab bars in the bathroom? Yes  Shower chair or bench in shower? No  Elevated toilet seat or a handicapped toilet? No    Cognitive Function:    05/14/2020    1:22 PM 12/15/2016   10:40 AM  08/30/2015    1:45 PM  MMSE - Mini Mental State Exam  Orientation to time '5 5 5  '$ Orientation to Place '5 5 5  '$ Registration '3 3 3  '$ Attention/ Calculation 5 0 0  Recall '3 3 3  '$ Language- name 2 objects  0 0  Language- repeat '1 1 1  '$ Language- follow 3 step command  3 3  Language- read & follow direction  0 0  Write a sentence  0 0  Copy design  0 0  Total score  20 20        05/18/2022    1:31 PM  6CIT Screen  What Year?  0 points  What month? 0 points  What time? 0 points  Count back from 20 0 points  Months in reverse 0 points  Repeat phrase 4 points  Total Score 4 points    Immunizations Immunization History  Administered Date(s) Administered   Fluad Quad(high Dose 65+) 01/16/2019, 11/29/2021   Influenza Split 03/27/2011, 01/23/2012   Influenza,inj,Quad PF,6+ Mos 11/26/2012, 11/10/2013, 11/24/2016, 12/25/2017   PFIZER(Purple Top)SARS-COV-2 Vaccination 10/20/2019, 11/10/2019   Pneumococcal Conjugate-13 08/21/2014   Pneumococcal Polysaccharide-23 05/19/2008, 08/30/2015   Td 03/13/2001   Tdap 01/23/2012   Zoster Recombinat (Shingrix) 05/04/2021, 06/21/2021   Zoster, Live 01/07/2013    TDAP status: Due, Education has been provided regarding the importance of this vaccine. Advised may receive this vaccine at local pharmacy or Health Dept. Aware to provide a copy of the vaccination record if obtained from local pharmacy or Health Dept. Verbalized acceptance and understanding.  Flu Vaccine status: Up to date  Pneumococcal vaccine status: Up to date  Covid-19 vaccine status: Declined, Education has been provided regarding the importance of this vaccine but patient still declined. Advised may receive this vaccine at local pharmacy or Health Dept.or vaccine clinic. Aware to provide a copy of the vaccination record if obtained from local pharmacy or Health Dept. Verbalized acceptance and understanding.  Qualifies for Shingles Vaccine? Yes   Zostavax completed  unknown   Shingrix  Completed?: Yes  Screening Tests Health Maintenance  Topic Date Due   COVID-19 Vaccine (3 - Pfizer risk series) 12/08/2019   COLONOSCOPY (Pts 45-14yr Insurance coverage will need to be confirmed)  11/11/2020   DTaP/Tdap/Td (3 - Td or Tdap) 01/22/2022   MAMMOGRAM  06/15/2022   Medicare Annual Wellness (AWV)  05/18/2023   Pneumonia Vaccine 72 Years old  Completed   INFLUENZA VACCINE  Completed   DEXA SCAN  Completed   Hepatitis C Screening  Completed   Zoster Vaccines- Shingrix  Completed   HPV VACCINES  Aged Out   Lung Cancer Screening  Discontinued    Health Maintenance  Health Maintenance Due  Topic Date Due   COVID-19 Vaccine (3 - Pfizer risk series) 12/08/2019   COLONOSCOPY (Pts 45-460yrInsurance coverage will need to be confirmed)  11/11/2020   DTaP/Tdap/Td (3 - Td or Tdap) 01/22/2022    Colorectal cancer screening: Type of screening: Colonoscopy. Completed 11/12/2015. Repeat every 5 years declines a this time.  Mammogram status: Completed 06/14/2021. Repeat every year pt will call to schedule.  Bone Density status: Completed 11/29/2021. Results reflect: Bone density results: OSTEOPENIA. Repeat every 2 years.  Lung Cancer Screening: (Low Dose CT Chest recommended if Age 74-80ears, 30 pack-year currently smoking OR have quit w/in 15years.) does not qualify. Completed lung cancer screening.  Lung Cancer Screening Referral: no  Additional Screening:  Hepatitis C Screening: does not qualify; Completed 07/11/2021  Vision Screening: Recommended annual ophthalmology exams for early detection of glaucoma and other disorders of the eye. Is the patient up to date with their annual eye exam?  No  Who is the provider or what is the name of the office in which the patient attends annual eye exams? Patient will schedule appointment. If pt is not established with a provider, would they like to be referred to a provider to establish care? Yes .   Dental Screening: Recommended  annual dental exams for proper oral hygiene  Community Resource Referral / Chronic Care Management: CRR required this visit?  No   CCM required this visit?  No  Plan:     I have personally reviewed and noted the following in the patient's chart:   Medical and social history Use of alcohol, tobacco or illicit drugs  Current medications and supplements including opioid prescriptions. Patient is not currently taking opioid prescriptions. Functional ability and status Nutritional status Physical activity Advanced directives List of other physicians Hospitalizations, surgeries, and ER visits in previous 12 months Vitals Screenings to include cognitive, depression, and falls Referrals and appointments  In addition, I have reviewed and discussed with patient certain preventive protocols, quality metrics, and best practice recommendations. A written personalized care plan for preventive services as well as general preventive health recommendations were provided to patient.     Lebron Conners, LPN   [id number]   Nurse Notes: none

## 2022-05-18 NOTE — Patient Instructions (Signed)
[name] , Thank you for taking time to come for your Medicare Wellness Visit. I appreciate your ongoing commitment to your health goals. Please review the following plan we discussed and let me know if I can assist you in the future.   These are the goals we discussed:  Goals      Increase physical activity     Starting 12/15/2016, I will continue to exercise for 5 min daily.      Patient Stated     05/14/2020, I will maintain and continue medications as prescribed.     Patient Stated     Would like to maintain current routine     Patient Stated     Trying to get off some medications.        This is a list of the screening recommended for you and due dates:  Health Maintenance  Topic Date Due   COVID-19 Vaccine (3 - Pfizer risk series) 12/08/2019   Colon Cancer Screening  11/11/2020   DTaP/Tdap/Td vaccine (3 - Td or Tdap) 01/22/2022   Mammogram  06/15/2022   Medicare Annual Wellness Visit  05/18/2023   Pneumonia Vaccine  Completed   Flu Shot  Completed   DEXA scan (bone density measurement)  Completed   Hepatitis C Screening: USPSTF Recommendation to screen - Ages 64-79 yo.  Completed   Zoster (Shingles) Vaccine  Completed   HPV Vaccine  Aged Out   Screening for Lung Cancer  Discontinued    Advanced directives: Please bring a copy of your health care power of attorney and living will to the office to be added to your chart at your convenience.   Conditions/risks identified: Aim for 30 minutes of exercise or brisk walking, 6-8 glasses of water, and 5 servings of fruits and vegetables each day.   Next appointment: Follow up in one year for your annual wellness visit 05/23/2023 @ 11:45 via telephone.   Preventive Care 30 Years and Older, Female Preventive care refers to lifestyle choices and visits with your health care provider that can promote health and wellness. What does preventive care include? A yearly physical exam. This is also called an annual well  check. Dental exams once or twice a year. Routine eye exams. Ask your health care provider how often you should have your eyes checked. Personal lifestyle choices, including: Daily care of your teeth and gums. Regular physical activity. Eating a healthy diet. Avoiding tobacco and drug use. Limiting alcohol use. Practicing safe sex. Taking low-dose aspirin every day. Taking vitamin and mineral supplements as recommended by your health care provider. What happens during an annual well check? The services and screenings done by your health care provider during your annual well check will depend on your age, overall health, lifestyle risk factors, and family history of disease. Counseling  Your health care provider may ask you questions about your: Alcohol use. Tobacco use. Drug use. Emotional well-being. Home and relationship well-being. Sexual activity. Eating habits. History of falls. Memory and ability to understand (cognition). Work and work Statistician. Reproductive health. Screening  You may have the following tests or measurements: Height, weight, and BMI. Blood pressure. Lipid and cholesterol levels. These may be checked every 5 years, or more frequently if you are over 3 years old. Skin check. Lung cancer screening. You may have this screening every year starting at age 20 if you have a 30-pack-year history of smoking and currently smoke or have quit within the past 15 years. Fecal occult blood test (  FOBT) of the stool. You may have this test every year starting at age 39. Flexible sigmoidoscopy or colonoscopy. You may have a sigmoidoscopy every 5 years or a colonoscopy every 10 years starting at age 36. Hepatitis C blood test. Hepatitis B blood test. Sexually transmitted disease (STD) testing. Diabetes screening. This is done by checking your blood sugar (glucose) after you have not eaten for a while (fasting). You may have this done every 1-3 years. Bone density scan.  This is done to screen for osteoporosis. You may have this done starting at age 59. Mammogram. This may be done every 1-2 years. Talk to your health care provider about how often you should have regular mammograms. Talk with your health care provider about your test results, treatment options, and if necessary, the need for more tests. Vaccines  Your health care provider may recommend certain vaccines, such as: Influenza vaccine. This is recommended every year. Tetanus, diphtheria, and acellular pertussis (Tdap, Td) vaccine. You may need a Td booster every 10 years. Zoster vaccine. You may need this after age 54. Pneumococcal 13-valent conjugate (PCV13) vaccine. One dose is recommended after age 70. Pneumococcal polysaccharide (PPSV23) vaccine. One dose is recommended after age 8. Talk to your health care provider about which screenings and vaccines you need and how often you need them. This information is not intended to replace advice given to you by your health care provider. Make sure you discuss any questions you have with your health care provider. Document Released: 03/26/2015 Document Revised: 11/17/2015 Document Reviewed: 12/29/2014 Elsevier Interactive Patient Education  2017 [place] Prevention in the Home Falls can cause injuries. They can happen to people of all ages. There are many things you can do to make your home safe and to help prevent falls. What can I do on the outside of my home? Regularly fix the edges of walkways and driveways and fix any cracks. Remove anything that might make you trip as you walk through a door, such as a raised step or threshold. Trim any bushes or trees on the path to your home. Use bright outdoor lighting. Clear any walking paths of anything that might make someone trip, such as rocks or tools. Regularly check to see if handrails are loose or broken. Make sure that both sides of any steps have handrails. Any raised decks and porches  should have guardrails on the edges. Have any leaves, snow, or ice cleared regularly. Use sand or salt on walking paths during winter. Clean up any spills in your garage right away. This includes oil or grease spills. What can I do in the bathroom? Use night lights. Install grab bars by the toilet and in the tub and shower. Do not use towel bars as grab bars. Use non-skid mats or decals in the tub or shower. If you need to sit down in the shower, use a plastic, non-slip stool. Keep the floor dry. Clean up any water that spills on the floor as soon as it happens. Remove soap buildup in the tub or shower regularly. Attach bath mats securely with double-sided non-slip rug tape. Do not have throw rugs and other things on the floor that can make you trip. What can I do in the bedroom? Use night lights. Make sure that you have a light by your bed that is easy to reach. Do not use any sheets or blankets that are too big for your bed. They should not hang down onto the floor. Have a  firm chair that has side arms. You can use this for support while you get dressed. Do not have throw rugs and other things on the floor that can make you trip. What can I do in the kitchen? Clean up any spills right away. Avoid walking on wet floors. Keep items that you use a lot in easy-to-reach places. If you need to reach something above you, use a strong step stool that has a grab bar. Keep electrical cords out of the way. Do not use floor polish or wax that makes floors slippery. If you must use wax, use non-skid floor wax. Do not have throw rugs and other things on the floor that can make you trip. What can I do with my stairs? Do not leave any items on the stairs. Make sure that there are handrails on both sides of the stairs and use them. Fix handrails that are broken or loose. Make sure that handrails are as long as the stairways. Check any carpeting to make sure that it is firmly attached to the stairs.  Fix any carpet that is loose or worn. Avoid having throw rugs at the top or bottom of the stairs. If you do have throw rugs, attach them to the floor with carpet tape. Make sure that you have a light switch at the top of the stairs and the bottom of the stairs. If you do not have them, ask someone to add them for you. What else can I do to help prevent falls? Wear shoes that: Do not have high heels. Have rubber bottoms. Are comfortable and fit you well. Are closed at the toe. Do not wear sandals. If you use a stepladder: Make sure that it is fully opened. Do not climb a closed stepladder. Make sure that both sides of the stepladder are locked into place. Ask someone to hold it for you, if possible. Clearly mark and make sure that you can see: Any grab bars or handrails. First and last steps. Where the edge of each step is. Use tools that help you move around (mobility aids) if they are needed. These include: Canes. Walkers. Scooters. Crutches. Turn on the lights when you go into a dark area. Replace any light bulbs as soon as they burn out. Set up your furniture so you have a clear path. Avoid moving your furniture around. If any of your floors are uneven, fix them. If there are any pets around you, be aware of where they are. Review your medicines with your doctor. Some medicines can make you feel dizzy. This can increase your chance of falling. Ask your doctor what other things that you can do to help prevent falls. This information is not intended to replace advice given to you by your health care provider. Make sure you discuss any questions you have with your health care provider. Document Released: 12/24/2008 Document Revised: 08/05/2015 Document Reviewed: 04/03/2014 Elsevier Interactive Patient Education  2017 [employer].

## 2022-05-19 ENCOUNTER — Ambulatory Visit: Payer: Medicare Other | Admitting: Pharmacist

## 2022-05-19 NOTE — Progress Notes (Unsigned)
Care Management & Coordination Services Pharmacy Note  05/19/2022 Name:  GRACEMARIE YONKE MRN:  [id number] DOB:  1950/06/19  Summary: Initial visit -Hallucination/cognitive concerns: pt/husband report improvement in hallucinations since stopping zolpidem; she reports sleep is ok in meantime; she has also been holding tramadol and cyclobenzaprine, reports pain is worse -Reviewed medication list for other contributors: hydroxyzine and solifenacin are also known to contribute to cognitive impairment due to anticholinergic effects; further, it is unclear how beneficial these medications are for her anxiety and overactive bladder, respectively  Recommendations/Changes made from today's visit: -Advised to hold hydroxyzine and solifenacin due to risk for cognitive impairment/hallucinations; can still take hydroxyzine PRN for anxiety -Can consider adding tramadol back on PRN basis - f/u with PCP as scheduled -Try melatonin for sleep (start 3-5 mg, can increase to 10 mg if needed)  Follow up plan: -Pharmacist follow up televisit scheduled for 1 month -PCP appt 05/26/22; Neurology appt 07/11/22    Subjective: ILIYANA SWEENY is an 72 y.o. year old female who is a primary patient of Tower, Wynelle Fanny, MD.  The care coordination team was consulted for assistance with disease management and care coordination needs.    Engaged with patient face to face for initial visit. Patient lives at home with her husband, Sonia Side. Her husband handles her medications, sets up pill box for her each week.   Recent office visits: 05/17/22 Dr Glori Bickers OV: depression/anxiety - worsening anxiety, panic, hallucinations. Stopped ambien. Tapering paxil. F/u with psych. Referred to neuro. Hold tramadol and flexeril.   02/16/22 Dr Glori Bickers OV: Hospital f/u - [facility]ECOPD. Advised to quit vaping. Referred to PT. F/u with psych - discuss Lorrin Mais, may be adding to falls.  01/26/22 Dr Lorelei Pont OV: chronic low back pain - consider MRI, epidural  steroid injection. Start tramadol today.  Recent consult visits: 04/17/22 Dr Rockey Situ (Cardiology): HF, Afib. In NSR. SOB - rec lasix 20 daily and Kcl 20 daily.   02/28/22 Dr Patsey Berthold (Pulm): COPD/asthma - d/c symbicort. Start Trelegy Advised to stop vaping.   02/24/22 NP Hackney (HF clinic): HFpEF - weigh daily. Decrease lasix to 20 daily (1/2 tab).  02/21/22 PA Dunn (Cardiology): Hospital f/u (acute HF and AECOPD). In Afib. Add Diltiazem 120 mg daily.  02/01/22 Dr Rockey Situ (Cardiology): Afib, HF. Zio monitor confirmed persistent afib. Set up cardioversion. SOB - add Lasix 20 mg and Kcl 10 mg.   Hospital visits: 03/02/22 Admission [facility]): cardioversion  02/13/22 - 02/15/22 Admission [facility]): Acute resp failure, CHF, COPD. Tx with solu-medrol, nebulizers. Discharged with Symbicort, prednisone. Referred to HF clinic as well.  Objective:  Lab Results  Component Value Date   CREATININE 0.80 02/21/2022   BUN 15 02/21/2022   GFR 87.73 06/07/2021   GFRNONAA >60 02/21/2022   GFRAA >90 09/17/2012   NA 138 02/21/2022   K 3.4 (L) 02/21/2022   CALCIUM 9.4 02/21/2022   CO2 23 02/21/2022   GLUCOSE 132 (H) 02/21/2022    Lab Results  Component Value Date/Time   HGBA1C 6.1 06/07/2021 03:00 PM   HGBA1C 6.1 05/14/2020 11:28 AM   GFR 87.73 06/07/2021 03:00 PM   GFR 88.72 05/14/2020 11:28 AM    Last diabetic Eye exam:  Lab Results  Component Value Date/Time   HMDIABEYEEXA normal 06/12/2007 12:00 AM    Last diabetic Foot exam:  Lab Results  Component Value Date/Time   HMDIABFOOTEX yes 04/16/2008 12:00 AM     Lab Results  Component Value Date   CHOL 118 06/07/2021   HDL  41.30 06/07/2021   LDLCALC 37 06/07/2021   LDLDIRECT 73.0 08/21/2014   TRIG 197.0 (H) 06/07/2021   CHOLHDL 3 06/07/2021       Latest Ref Rng & Units 06/07/2021    3:00 PM 05/14/2020   11:28 AM 01/08/2019   10:38 AM  Hepatic Function  Total Protein 6.0 - 8.3 g/dL 7.4  6.8  7.2   Albumin 3.5 - 5.2 g/dL 4.7  4.1  4.4    AST 0 - 37 U/L '18  14  15   '$ ALT 0 - 35 U/L '17  16  17   '$ Alk Phosphatase 39 - 117 U/L 75  53  60   Total Bilirubin 0.2 - 1.2 mg/dL 0.6  0.5  0.5     Lab Results  Component Value Date/Time   TSH 2.14 06/07/2021 03:00 PM   TSH 1.36 05/14/2020 11:28 AM   FREET4 0.79 11/10/2013 02:35 PM   FREET4 0.7 07/16/2007 09:41 AM       Latest Ref Rng & Units 02/21/2022   11:28 AM 02/13/2022    5:47 AM 02/01/2022    3:12 PM  CBC  WBC 4.0 - 10.5 K/uL 17.5  12.6  9.7   Hemoglobin 12.0 - 15.0 g/dL 15.3  12.2  13.5   Hematocrit 36.0 - 46.0 % 45.2  36.7  40.3   Platelets 150 - 400 K/uL 150  144  144     No results found for: "VD25OH", "VITAMINB12"  Clinical ASCVD: No  The ASCVD Risk score (Arnett DK, et al., 2019) failed to calculate for the following reasons:   The systolic blood pressure is missing   The valid total cholesterol range is 130 to 320 mg/dL    CHA2DS2/VAS Stroke Risk Points  Current as of 6 hours ago     4 >= 2 Points: High Risk  1 - 1.99 Points: Medium Risk  0 Points: Low Risk    Last Change:       Points Metrics  1 Has Congestive Heart Failure:  Yes    Current as of 6 hours ago  0 Has Vascular Disease:  No    Current as of 6 hours ago  1 Has Hypertension:  Yes    Current as of 6 hours ago  1 Age:  72    Current as of 6 hours ago  0 Has Diabetes:  No    Current as of 6 hours ago  0 Had Stroke:  No  Had TIA:  No  Had Thromboembolism:  No    Current as of 6 hours ago  1 Female:  Yes    Current as of 6 hours ago       05/18/2022    1:25 PM 05/17/2022   11:52 AM 05/16/2021    1:19 PM  Depression screen PHQ 2/9  Decreased Interest 0 2 0  Down, Depressed, Hopeless 0 2 1  PHQ - 2 Score 0 4 1  Altered sleeping 0 3   Tired, decreased energy 0 3   Change in appetite 0 3   Feeling bad or failure about yourself  0 3   Trouble concentrating 0 3   Moving slowly or fidgety/restless 0 0   Suicidal thoughts 0 0   PHQ-9 Score 0 19   Difficult doing work/chores Not difficult  at all Very difficult      Social History   Tobacco Use  Smoking Status Former   Packs/day: 1.00   Types: E-cigarettes, Cigarettes  Quit date: 2011   Years since quitting: 13.1  Smokeless Tobacco Never  Tobacco Comments   She is vaping, former smoker     BP Readings from Last 3 Encounters:  05/17/22 128/70  04/17/22 110/70  03/02/22 124/66   Pulse Readings from Last 3 Encounters:  05/17/22 70  04/17/22 73  03/02/22 72   Wt Readings from Last 3 Encounters:  05/18/22 207 lb (93.9 kg)  05/17/22 207 lb (93.9 kg)  04/17/22 208 lb 4 oz (94.5 kg)   BMI Readings from Last 3 Encounters:  05/18/22 36.67 kg/m  05/17/22 36.67 kg/m  04/17/22 36.89 kg/m    Allergies  Allergen Reactions   Abilify [Aripiprazole]     Vision problem   Codeine Nausea And Vomiting    REACTION: Nausea and vomiting Pt has tolerated vicodin & percocet in the past   Cymbalta [Duloxetine Hcl]     More depressed/tearful    Lipitor [Atorvastatin]     Muscle pain    Wellbutrin [Bupropion]     Medications Reviewed Today     Reviewed by Charlton Haws, RPH (Pharmacist) on 05/19/22 at 1044  Med List Status: <None>   Medication Order Taking? Sig Documenting Provider Last Dose Status Informant  albuterol (VENTOLIN HFA) 108 (90 Base) MCG/ACT inhaler [id number] Yes Inhale 2 puffs into the lungs every 6 (six) hours as needed for wheezing or shortness of breath. Loletha Grayer, MD Taking Active   apixaban (ELIQUIS) 5 MG TABS tablet [id number] Yes TAKE 1 TABLET BY MOUTH TWICE A DAY Gollan, Kathlene November, MD Taking Active   busPIRone (BUSPAR) 30 MG tablet [id number] Yes TAKE ONE HALF TO 1 TABLET BY MOUTH 3 TIMES DAILY AS DIRECTED Tower, Wynelle Fanny, MD Taking Active Self  cyclobenzaprine (FLEXERIL) 10 MG tablet [id number] No Take 10 mg by mouth 3 (three) times daily as needed for muscle spasms. 0.5-1 tablets  Patient not taking: Reported on 05/19/2022   [provider] Not Taking Active            Med  Note Glori Bickers, MARNE A   Wed May 17, 2022  1:18 PM) holding  diltiazem (CARDIZEM CD) 120 MG 24 hr capsule [id number] Yes Take 1 capsule (120 mg total) by mouth daily. Rise Mu, PA-C Taking Active   DULoxetine (CYMBALTA) 60 MG capsule [id number] Yes Take 60 mg by mouth daily. [provider] Taking Active   flecainide (TAMBOCOR) 100 MG tablet [id number] Yes Take 1 tablet (100 mg total) by mouth 2 (two) times daily. Minna Merritts, MD Taking Active Self  Fluticasone-Umeclidin-Vilant (TRELEGY ELLIPTA) 100-62.5-25 MCG/ACT AEPB [id number] Yes Inhale 1 puff into the lungs daily. Tyler Pita, MD Taking Active   Fluticasone-Umeclidin-Vilant [facility] ELLIPTA) 100-62.5-25 MCG/ACT AEPB [id number] Yes Inhale 1 puff into the lungs daily. Tyler Pita, MD Taking Active   furosemide (LASIX) 20 MG tablet [id number] Yes Take 1 tablet (20 mg total) by mouth daily. Rise Mu, PA-C Taking Active   hydrOXYzine (VISTARIL) 25 MG capsule [id number] Yes Take 25 mg by mouth 2 (two) times daily. Doles-Johnson, Teah, NP Taking Active   metoprolol succinate (TOPROL-XL) 50 MG 24 hr tablet [id number] Yes TAKE 1 TABLET BY MOUTH TWICE A DAY WITH MEALS Gollan, Kathlene November, MD Taking Active   PARoxetine (PAXIL) 10 MG tablet [id number] Yes Take 1 tablet by mouth daily. Doles-Johnson, Teah, NP Taking Active   potassium chloride (KLOR-CON) 10 MEQ tablet [id number] Yes Take 2 tablets (20 mEq total) by mouth daily. [name] Rogue  J, MD Taking Active   rosuvastatin (CRESTOR) 10 MG tablet [id number] Yes TAKE 1 TABLET BY MOUTH ONCE A DAY Tower, Wynelle Fanny, MD Taking Active   solifenacin (VESICARE) 10 MG tablet [id number] Yes TAKE 1 TABLET BY MOUTH ONCE A DAY Tower, Wynelle Fanny, MD Taking Active   traMADol (ULTRAM) 50 MG tablet [id number] No TAKE 1-2 TABLETS BY MOUTH EVERY 8 HOURS AS NEEDED FOR MODERATE PAIN  Patient not taking: Reported on 05/19/2022   Abner Greenspan, MD Not Taking Active            Med Note Glori Bickers, MARNE A   Wed May 17, 2022  1:18 PM) holding            SDOH:  (Social Determinants of Health) assessments and interventions performed: No SDOH Interventions    Flowsheet Row Clinical Support from 05/18/2022 in [place] at [facility] Office Visit from 05/17/2022 in [place] at [place] from 05/14/2020 in [place] at [facility] Office Visit from 10/17/2019 in [place] at [place] from 12/15/2016 in [place] at [place] Interventions Intervention Not Indicated -- -- -- --  Housing Interventions Intervention Not Indicated -- -- -- --  Transportation Interventions Intervention Not Indicated -- -- -- --  Utilities Interventions Intervention Not Indicated -- -- -- --  Alcohol Usage Interventions Intervention Not Indicated (Score <7) -- -- -- --  Depression Interventions/Treatment  -- Currently on Treatment, Counseling  [sees psychiatrist] Medication, Currently on Treatment Medication, Currently on Treatment --  [referral to PCP]  Financial Strain Interventions Intervention Not Indicated -- -- -- --  Physical Activity Interventions Patient Refused, Other (Comments) -- -- -- --  Stress Interventions Intervention Not Indicated -- -- -- --  Social Connections Interventions Intervention Not Indicated -- -- -- --       Medication Assistance: None required.  Patient affirms current coverage meets needs.  Medication Access: Within the past 30 days, how often has patient missed a dose of medication? 0 Is a pillbox or other method used to improve adherence? Yes  Factors that may affect medication adherence? Memory impairment Are meds synced by current pharmacy? No  Are meds delivered by current pharmacy? No  Does patient experience delays in picking up medications due to transportation concerns? No   [facility]  Reviewed: Is patient disadvantaged to use [facility]?: Yes  Current Rx insurance plan: [employer] Name and location of Current pharmacy:  [facility], [place] [address] Phone: [phone] Fax: [phone]  [facility] services reviewed with patient today?: No  Patient requests to transfer care to [facility]?: No  Reason patient declined to change pharmacies: Disadvantaged due to insurance/mail order  Compliance/Adherence/Medication fill history: Care Gaps: Colonoscopy (due 11/2020)  Star-Rating Drugs: Rosuvastatin - PDC   Assessment/Plan   Atrial Fibrillation (Goal: prevent stroke and major bleeding) -Controlled - maintaining NSR in recent cardiology OV -CHADSVASC: 4; previously set up 2 cardioversions but cancelled d/t spontaneous conversion to NSR -Current treatment: Metoprolol succinate 50 mg daily - Appropriate, Effective, Safe, Accessible Diltiazem CD 120 mg daily -Appropriate, Effective, Safe, Accessible Flecainide 100 mg BID -Appropriate, Effective, Safe, Accessible Eliquis 5 mg BID -Appropriate, Effective, Safe, Accessible -Medications previously tried: n/a -Counseled on increased risk of stroke due to Afib and benefits of  anticoagulation for stroke prevention; bleeding risk associated with Eliquis and importance of self-monitoring for signs/symptoms of bleeding; -Recommended to continue current medication  Heart Failure (Goal: manage symptoms and prevent exacerbations) -Controlled -Last ejection fraction: 50-55% (Date: 02/14/22) -HF type: HFpEF (EF > 50%) -NYHA Class: II (slight limitation of activity) -Current treatment: Furosemide 20 mg daily -Appropriate, Effective, Safe, Accessible Metoprolol succinate 50 mg daily -Appropriate, Effective, Safe, Accessible Potassium 20 meq daily -Appropriate, Effective, Safe, Accessible -Medications previously tried: n/a  -Current  home daily weights: not checking -Educated on Benefits of medications for managing symptoms and prolonging life Importance of weighing daily - if you gain more than 3 pounds in one day or 5 pounds in one week, call cardiology; Proper diuretic administration and potassium supplementation -Recommended to continue current medication  COPD / asthma (Goal: control symptoms and prevent exacerbations) -Controlled - pt reports improvement in sx since starting Trelegy -Gold Grade: Gold 1 (FEV1>80%) -Current COPD Classification:  E (exacerbation leading to hospitalization OR 2+ moderate exacerbations) -MMRC/CAT score: not on file -Pulmonary function testing: 02/2022 - FEV1 80% pred; FEV1/FVC 0.89; FEV1 11% change post-bronchodilator. Dx: minimal obstructive airway disease, reactive airways, minimal restriction (interstitial - possible fibrosis or inflammation) -Exacerbations requiring treatment in last 6 months: 1 -Current treatment  Trelegy 100-62.5-25 mcg/act 1 puff daily - Appropriate, Effective, Safe, Accessible Albuterol HFA prn -Appropriate, Effective, Safe, Accessible -Medications previously tried: symbicort  -Patient reports consistent use of maintenance inhaler -Frequency of rescue inhaler use: 2-3x weekly -Counseled on Proper inhaler technique; Benefits of consistent maintenance inhaler use; Differences between maintenance and rescue inhalers -Recommended to continue current medication  Depression/Anxiety (Goal: manage symptoms) -Follows with psychiatry, they are adjusting medications currently - pt was in process of weaning off paroxetine but felt significantly worse off med so it was restarted at 10 mg last week; pt also reports she takes buspirone PRN but typically twice a day, she also takes hydroxyzine scheduled twice a day -Pt also asked about xanax for PRN use - discussed overall risks of chronic benzodiazepine use, would not recommend starting in her case -PHQ9: 0 (05/2022) - minimal  depression -GAD7: not on file -Connected with Psychiatry (NP Teah Doles-Johnson) for mental health support -Current treatment: Buspirone 30 mg BID - Appropriate, Effective, Safe, Accessible Duloxetine 60 mg daily -Appropriate, Effective, Safe, Accessible Paroxetine 10 mg daily -Appropriate, Effective, Safe, Accessible Hydroxyzine 25 mg BID -Appropriate, Effective, Query Safe -Medications previously tried/failed: abilify (vision), bupropion, zolpidem, sertraline, mirtazapine -Reviewed medications for potential to contribute to cognitive impairment - hydroxyzine is most likely to cause issues given anticholinergic effects, it is also unclear how helpful this is for her anxiety as she has been on it for years -Advised to hold hydroxyzine; can use PRN for anxiety  Insomnia (Goal: improve sleep) -Not ideally controlled - pt was previously on zolpidem but this was recently stopped due to hallucinations, pt/family reports hallucinations are gone, she is sleeping ok in the meantime -Current treatment  None -Medications previously tried: zolpidem  -Reviewed importance of staying off zolpidem, avoiding sedative hypnotics in the future -Advised she can use melatonin 3-5 mg at bedtime in meantime  Overactive bladder (Goal: manage symptoms) -Unclear control - pt reports urinating frequently during the night, it is not clear how helpful solifenacin is -Current treatment  Solifenacin 10 mg daily -Appropriate, Effective, Query Safe -Medications previously tried: n/a  -Reviewed anticholinergic effects of solifenacin can contribute to cognitive impairment -Advised to hold solifenacin and monitor urination symptoms; consider Mybetriq or Gemtesa if further  control is needed in future  Chronic back pain (Goal: manage pain) -Not ideally controlled - pt is currently holding pain medications d/t concern for cognitive side effects; she is using Tylenol PRN -Hx degenerative disc disease -Current treatment   Tramadol 50 mg PRN - on hold Cyclobenzaprine 10 mg PRN - on hold Tylenol 500 mg PRN - Appropriate, Effective, Safe, Accessible -Medications previously tried: n/a  -Hallucinations have significantly improved off of zolpidem; Can consider adding pain medications back one by one  Hyperlipidemia: (LDL goal < 70) -Controlled - LDL 37 (05/2021) -Current treatment: Rosuvastatin 10 mg daily - Appropriate, Effective, Safe, Accessible -Medications previously tried: n/a  -Educated on Cholesterol goals; Benefits of statin for ASCVD risk reduction; -Recommended to continue current medication  Health Maintenance -Vaccine gaps: TDAP  Charlene Brooke, PharmD, BCACP Clinical Pharmacist [facility] [phone]

## 2022-05-22 NOTE — Patient Instructions (Signed)
Visit Information  Phone number for Pharmacist: [phone]  Thank you for meeting with me to discuss your medications! I look forward to working with you to achieve your health care goals. Below is a summary of what we talked about during the visit:  Recommendations/Changes made from today's visit: -Advised to hold hydroxyzine and solifenacin due to risk for cognitive impairment/hallucinations; can still take hydroxyzine PRN for anxiety -Can consider adding tramadol back on PRN basis - f/u with PCP as scheduled -Try melatonin for sleep (start 3-5 mg, can increase to 10 mg if needed)  Follow up plan: -Pharmacist follow up televisit scheduled for 1 month -PCP appt 05/26/22; Neurology appt 07/11/22   Charlene Brooke, PharmD, BCACP Clinical Pharmacist [facility] at [facility] [phone]

## 2022-05-23 DIAGNOSIS — H25013 Cortical age-related cataract, bilateral: Secondary | ICD-10-CM | POA: Diagnosis not present

## 2022-05-23 DIAGNOSIS — H2513 Age-related nuclear cataract, bilateral: Secondary | ICD-10-CM | POA: Diagnosis not present

## 2022-05-24 ENCOUNTER — Ambulatory Visit: Payer: Medicare Other | Admitting: Family Medicine

## 2022-05-24 ENCOUNTER — Encounter: Payer: Self-pay | Admitting: Pulmonary Disease

## 2022-05-24 NOTE — Progress Notes (Signed)
Subjective:    Patient ID: Anita Harrell, female    DOB: 11-07-1950, 72 y.o.   MRN: [id number] Patient Care Team: Tower, Wynelle Fanny, MD as PCP - General (Family Medicine) Rockey Situ Kathlene November, MD as PCP - Cardiology (Cardiology) Vania Rea, MD as Consulting Physician (Obstetrics and Gynecology) Tyler Pita, MD as Consulting Physician (Pulmonary Disease) Doles-Johnson, Teah, NP as Nurse Practitioner (Psychiatry) Charlton Haws, [facility] as Pharmacist (Pharmacist)  Chief Complaint  Patient presents with   Follow-up    Increased SOB, wheezing and chest tightness over the past 2 wks. She has been out of her albuterol inhaler for about a month. Still using vape daily.     HPI Patient is a 72 year old former smoker who currently vapes nicotine containing substance, who presents for follow-up.  She was last seen on 26 Jul 2021.  Recall that at that time she presented because of dyspnea on exertion.  The patient also was experiencing fatigue and feeling that she was a "couch potato".  She was noted at that time to be in atrial fibrillation with rapid ventricular response.  She was placed on Eliquis and given an appointment for the following day for evaluation by cardiology.  She has been seen by cardiology and they are attempting to control her atrial fibrillation.  She states that she used to use as needed albuterol inhaler however she ran out of this inhaler approximately a month ago.  She has noted some wheezing and chest tightness over the last 2 weeks.  Fevers, chills or sweats.  No cough or sputum production.  No pain, lower extremity edema or calf tenderness.  She does not endorse any other symptomatology today.  Desires to have flu vaccine today.   Review of Systems A 10 point review of systems was performed and it is as noted above otherwise negative.  Patient Active Problem List   Diagnosis Date Noted   Bilateral hip bursitis 10/05/2021   Atrial fibrillation with RVR ([place])  10/05/2021   Sleep walking 10/05/2021   Cerumen impaction 06/21/2021   Estrogen deficiency 06/07/2021   Overactive bladder 10/17/2019   Medicare annual wellness visit, subsequent 01/16/2019   Thyroid nodule 05/01/2018   Electronic cigarette use 04/03/2018   Obesity (BMI 30-39.9) 04/03/2018   Lung mass 02/01/2018   Abnormal chest x-ray 01/29/2018   Smokers' cough ([place]) 01/01/2018   Microscopic hematuria 08/13/2012   Routine general medical examination at a health care facility 07/22/2012   Leukocytosis 03/30/2010   Prediabetes 02/21/2008   HYPERTENSION, BENIGN ESSENTIAL 01/22/2008   ALLERGIC RHINITIS 07/25/2007   Low back pain 06/26/2007   Personal history of goiter 07/27/2006   Hyperlipidemia 07/27/2006   PANIC DISORDER 07/27/2006   Former smoker 07/27/2006   Depression with anxiety 07/27/2006   Insomnia 07/27/2006   Personal history of colonic polyps 06/09/2004   Social History   Tobacco Use   Smoking status: Former    Packs/day: 1.00    Types: E-cigarettes, Cigarettes    Quit date: 2011    Years since quitting: 13.2   Smokeless tobacco: Never   Tobacco comments:    She is vaping, former smoker    Substance Use Topics   Alcohol use: No    Alcohol/week: 0.0 standard drinks of alcohol   Allergies  Allergen Reactions   Abilify [Aripiprazole]     Vision problem   Codeine Nausea And Vomiting    REACTION: Nausea and vomiting Pt has tolerated vicodin & percocet in the past  Cymbalta [Duloxetine Hcl]     More depressed/tearful    Lipitor [Atorvastatin]     Muscle pain    Wellbutrin [Bupropion]    Current Meds  Medication Sig   busPIRone (BUSPAR) 30 MG tablet TAKE ONE HALF TO 1 TABLET BY MOUTH 3 TIMES DAILY AS DIRECTED   ELIQUIS 5 MG TABS tablet TAKE 1 TABLET BY MOUTH TWICE A DAY   famotidine (PEPCID) 20 MG tablet Take 1 tablet (20 mg total) by mouth at bedtime. (Patient not taking: Reported on 02/13/2022)   [metoprolol succinate (TOPROL-XL) 50 MG 24 hr tablet TAKE  1 TABLET BY MOUTH TWICE A DAY WITH MEALS   PARoxetine (PAXIL) 40 MG tablet Take 1.5 tablets (60 mg total) by mouth every morning.   rosuvastatin (CRESTOR) 10 MG tablet TAKE 1 TABLET BY MOUTH ONCE A DAY   solifenacin (VESICARE) 10 MG tablet TAKE 1 TABLET BY MOUTH ONCE A DAY   Immunization History  Administered Date(s) Administered   Fluad Quad(high Dose 65+) 01/16/2019, 11/29/2021   Influenza Split 03/27/2011, 01/23/2012   Influenza,inj,Quad PF,6+ Mos 11/26/2012, 11/10/2013, 11/24/2016, 12/25/2017   PFIZER(Purple Top)SARS-COV-2 Vaccination 10/20/2019, 11/10/2019   Pneumococcal Conjugate-13 08/21/2014   Pneumococcal Polysaccharide-23 05/19/2008, 08/30/2015   Td 03/13/2001   Tdap 01/23/2012   Zoster Recombinat (Shingrix) 05/04/2021, 06/21/2021   Zoster, Live 01/07/2013       Objective:   Physical Exam BP (!) 142/90 (BP Location: Left Arm, Cuff Size: Normal)   Pulse 95   Temp (!) 97.4 F (36.3 C) (Oral)   Ht '5\' 3"'$  (1.6 m)   Wt 204 lb (92.5 kg)   SpO2 96% Comment: on RA  BMI 36.14 kg/m   SpO2: 96 % (on RA) O2 Device: None (Room air)  GENERAL: Obese woman, no acute distress, fully ambulatory, no conversational dyspnea. HEAD: Normocephalic, atraumatic.  EYES: Pupils equal, round, reactive to light.  No scleral icterus.  MOUTH: Pharynx clear, oral mucosa moist. NECK: Supple. No thyromegaly. Trachea midline. No JVD.  No adenopathy. PULMONARY: Good air entry bilaterally.  Coarse, otherwise, no adventitious sounds. CARDIOVASCULAR: S1 and S2.  Irregular heart rate (atrial fibrillation), no cardiac murmurs discerned. ABDOMEN: Obese, otherwise benign. MUSCULOSKELETAL: No joint deformity, no clubbing, no edema.  NEUROLOGIC: No overt focal deficit, no gait disturbance, speech is fluent. SKIN: Intact,warm,dry. PSYCH: Mood and behavior normal.       Assessment & Plan:     ICD-10-CM   1. Shortness of breath  R06.02 Pulmonary Function Test [facility] Only   Multifactorial Atrial fib,  obesity, deconditioning Reassess with PFTs    2. Persistent atrial fibrillation (HCC)  I48.19    Issue adds complexity to her management She is on Eliquis On beta-blocker Follows with cardiology    3. Obesity (BMI 30-39.9)  E66.9    This issue adds complexity to her management at Would benefit from weight loss    4. Need for immunization against influenza  Z23 Flu Vaccine QUAD High Dose(Fluad)    5. Vapes nicotine containing substance  Z72.0    Counseled with regards to discontinuation of vaping     Orders Placed This Encounter  Procedures   Flu Vaccine QUAD High Dose(Fluad)   Pulmonary Function Test [facility] Only    Standing Status:   Future    Number of Occurrences:   1    Standing Expiration Date:   03/31/2022    Order Specific Question:   Full PFT: includes the following: basic spirometry, spirometry pre & post bronchodilator, diffusion capacity (DLCO),  lung volumes    Answer:   Full PFT    Order Specific Question:   This test can only be performed at    Answer:   [place] ordered this encounter  Medications   albuterol (VENTOLIN HFA) 108 (90 Base) MCG/ACT inhaler    Sig: Inhale 2 puffs into the lungs every 6 (six) hours as needed for wheezing or shortness of breath.    Dispense:  18 g    Refill:  3   Patient has persistent issues with dyspnea.  Still with issues with persistent atrial fibrillation.  I suspect that this is adding to the issue.  We will reassess with PFTs.  She has been out of her rescue inhaler which she states has helped her in the past.  We will reinstitute this but will await PFTs to determine best management as necessary.  We will see her in follow-up in 3 months time she is to contact us prior to that time should any new difficulties arise.  Renold Don, MD Advanced Bronchoscopy [facility]    *This note was dictated using voice recognition software/Dragon.  Despite best efforts to proofread, errors can  occur which can change the meaning. Any transcriptional errors that result from this process are unintentional and may not be fully corrected at the time of dictation.

## 2022-05-25 DIAGNOSIS — F03A3 Unspecified dementia, mild, with mood disturbance: Secondary | ICD-10-CM | POA: Diagnosis not present

## 2022-05-25 DIAGNOSIS — F332 Major depressive disorder, recurrent severe without psychotic features: Secondary | ICD-10-CM | POA: Diagnosis not present

## 2022-05-25 DIAGNOSIS — F5101 Primary insomnia: Secondary | ICD-10-CM | POA: Diagnosis not present

## 2022-05-25 DIAGNOSIS — F411 Generalized anxiety disorder: Secondary | ICD-10-CM | POA: Diagnosis not present

## 2022-05-26 ENCOUNTER — Ambulatory Visit (INDEPENDENT_AMBULATORY_CARE_PROVIDER_SITE_OTHER): Payer: Medicare Other | Admitting: Family Medicine

## 2022-05-26 ENCOUNTER — Encounter: Payer: Self-pay | Admitting: Family Medicine

## 2022-05-26 VITALS — BP 118/66 | HR 71 | Temp 98.6°F | Ht 63.0 in | Wt 208.0 lb

## 2022-05-26 DIAGNOSIS — N3281 Overactive bladder: Secondary | ICD-10-CM | POA: Diagnosis not present

## 2022-05-26 DIAGNOSIS — F418 Other specified anxiety disorders: Secondary | ICD-10-CM | POA: Diagnosis not present

## 2022-05-26 DIAGNOSIS — M5136 Other intervertebral disc degeneration, lumbar region: Secondary | ICD-10-CM

## 2022-05-26 DIAGNOSIS — F05 Delirium due to known physiological condition: Secondary | ICD-10-CM

## 2022-05-26 DIAGNOSIS — F410 Panic disorder [episodic paroxysmal anxiety] without agoraphobia: Secondary | ICD-10-CM | POA: Diagnosis not present

## 2022-05-26 DIAGNOSIS — F5104 Psychophysiologic insomnia: Secondary | ICD-10-CM | POA: Diagnosis not present

## 2022-05-26 DIAGNOSIS — D72821 Monocytosis (symptomatic): Secondary | ICD-10-CM | POA: Diagnosis not present

## 2022-05-26 DIAGNOSIS — F513 Sleepwalking [somnambulism]: Secondary | ICD-10-CM | POA: Diagnosis not present

## 2022-05-26 DIAGNOSIS — R443 Hallucinations, unspecified: Secondary | ICD-10-CM | POA: Diagnosis not present

## 2022-05-26 MED ORDER — MIRABEGRON ER 25 MG PO TB24: 25.0000 mg | ORAL_TABLET | Freq: Every day | ORAL | 3 refills | Status: DC

## 2022-05-26 NOTE — Assessment & Plan Note (Signed)
Improved See a/p for dep/anx Sees psychiatry

## 2022-05-26 NOTE — Assessment & Plan Note (Signed)
Worse insomnia w/o ambien or hydroxyzine or remeron  Ms is better  Is calm despite not sleeping as much

## 2022-05-26 NOTE — Assessment & Plan Note (Signed)
Resolved off of ambien and mirtazapine   Takes hydroxyzine only when necessary  Holding vesicare   Adding back flexeril and tramadol prn  Will update if these return

## 2022-05-26 NOTE — Assessment & Plan Note (Signed)
Chronic  Lab today  No s/s of infection  Path rev ordered as well

## 2022-05-26 NOTE — Assessment & Plan Note (Signed)
Doing better after med change from psychiatrist after ms change and confusion and sundowning   Now taking Cymbalta 60 mg  Buspar 30 mg bid  Paxil 10 mg daily in evening   Off remeron  Uses hydroxyzine only when abs necessary -has not needed  Off ambien  Is more calm

## 2022-05-26 NOTE — Assessment & Plan Note (Signed)
No longer sundowning off ambiien/ mirtazapine and vesicare  Also off hydroxyzine but can take prn  MMSE score reassuring with 27/30  Nl clock draw Continue to follow and watch cog status

## 2022-05-26 NOTE — Assessment & Plan Note (Signed)
Limits mobility  Now that her mental status is better we can add back prn flexeril and tramadol with great caution  Family will watch her  Handicapped parking form done today

## 2022-05-26 NOTE — Assessment & Plan Note (Signed)
?   If vesicare added to MS change Stopped it   Sent px for myrbetriq to see if we could get it covered /likely prior auth If able to get will report back re: effectiveness and side eff at 25 mg dose

## 2022-05-26 NOTE — Progress Notes (Signed)
Subjective:    Patient ID: Anita Harrell, female    DOB: 06-18-1950, 72 y.o.   MRN: [id number]  HPI Pt presents to discuss memory concerns Also re check leukocytosis   Wt Readings from Last 3 Encounters:  05/26/22 208 lb (94.3 kg)  05/18/22 207 lb (93.9 kg)  05/17/22 207 lb (93.9 kg)   36.85 kg/m  Vitals:   05/26/22 1119  BP: 118/66  Pulse: 71  Temp: 98.6 F (37 C)  SpO2: 96%     Reviewed last visit here from 3/6 Sees Dr Wynetta Emery for psychiatry and we discussed vis/ aug hallucinations (multi factorial) As well as some paranoia and panic attacks and memory issues /occ confusion Possible sundowning  We did an in dept med review at the time  Was stopping [place] -this made the biggest difference  Trying the melatonin 10 mg  Still on 10 mg paxil - (did not do well stopping it) and taking at night    Currently taking  Cymbalta 60  Remeron 15 mg qhs- stopped this from psychiatrist  Hydroxyzine prn - has not had to take it (has on hand)  Buspar 30 mg bid   Was told to hold tramadol and flexeril   Mood Sleep Hallucinations  Night time behavior   She did have a pharmacy visit   Was adv to hold hydroxyzine and solifenacin  Consider tramadol prn with close supervision  Consider melatonin    Lab Results  Component Value Date   WBC 17.5 (H) 02/21/2022   HGB 15.3 (H) 02/21/2022   HCT 45.2 02/21/2022   MCV 86.9 02/21/2022   PLT 150 02/21/2022   Not sleeping well but is more calm    Pain from arthritis and sciatic nerve worse off flexeril and tramadol   All the hallucinations are gone  Mood is better  Less panic attacks and they are smaller   No longer confused  No longer memory problems   MMSE today 27   No s/s of infection    Patient Active Problem List   Diagnosis Date Noted   Sundowning 05/17/2022   Hallucinations 05/17/2022   Polypharmacy 05/17/2022   General weakness 02/16/2022   Poor balance 02/16/2022   Falling 02/16/2022   COPD  exacerbation ([place]) 02/13/2022   Fall at home, initial encounter 02/13/2022   Hypokalemia 02/13/2022   HTN (hypertension) 02/13/2022   Acute on chronic diastolic CHF (congestive heart failure) ([place]) 02/13/2022   DDD (degenerative disc disease), lumbar 12/26/2021   Bilateral hip bursitis 10/05/2021   Atrial fibrillation with RVR ([place]) 10/05/2021   Cerumen impaction 06/21/2021   Estrogen deficiency 06/07/2021   Overactive bladder 10/17/2019   Medicare annual wellness visit, subsequent 01/16/2019   Thyroid nodule 05/01/2018   Electronic cigarette use 04/03/2018   Obesity (BMI 30-39.9) 04/03/2018   Lung mass 02/01/2018   Abnormal chest x-ray 01/29/2018   Smokers' cough ([place]) 01/01/2018   Microscopic hematuria 08/13/2012   Routine general medical examination at a health care facility 07/22/2012   Leukocytosis 03/30/2010   Prediabetes 02/21/2008   HYPERTENSION, BENIGN ESSENTIAL 01/22/2008   ALLERGIC RHINITIS 07/25/2007   Low back pain 06/26/2007   Personal history of goiter 07/27/2006   Hyperlipidemia 07/27/2006   PANIC DISORDER 07/27/2006   Former smoker 07/27/2006   Depression with anxiety 07/27/2006   Insomnia 07/27/2006   Personal history of colonic polyps 06/09/2004   Past Medical History:  Diagnosis Date   Allergy    allergic rhinitis   Anxiety  Arrhythmia    atrial fibrillation   Back pain    Chest pain    CHF (congestive heart failure) (HCC)    COPD (chronic obstructive pulmonary disease) (HCC)    Depression    Epigastric pain    Goiter    History of tobacco abuse    Hyperglycemia    mild   Hyperlipidemia    Hypertension    Insomnia    Hx of   Labile blood pressure    Panic disorder    History of   Personal history of colonic polyps 06/09/2004   SVD (spontaneous vaginal delivery)    x 2   Past Surgical History:  Procedure Laterality Date   ABDOMINAL HYSTERECTOMY N/A 09/24/2012   Procedure: HYSTERECTOMY ABDOMINAL;  Surgeon: Gus Height, MD;  Location:  [facility];  Service: Gynecology;  Laterality: N/A;   BREAST BIOPSY     CARDIOVERSION N/A 02/09/2022   Procedure: CARDIOVERSION;  Surgeon: Minna Merritts, MD;  Location: [facility];  Service: Cardiovascular;  Laterality: N/A;   CARDIOVERSION N/A 03/02/2022   Procedure: CARDIOVERSION;  Surgeon: Minna Merritts, MD;  Location: [facility];  Service: Cardiovascular;  Laterality: N/A;   COLONOSCOPY     COLONOSCOPY  2017   ELBOW SURGERY  2004   EYE SURGERY     bilateral lasik   SALPINGOOPHORECTOMY Bilateral 09/24/2012   Procedure: SALPINGO OOPHORECTOMY;  Surgeon: Gus Height, MD;  Location: [facility];  Service: Gynecology;  Laterality: Bilateral;   THYROID SURGERY     B9 massess   WART FULGURATION N/A 09/24/2012   Procedure: FULGURATION VAGINAL WART;  Surgeon: Gus Height, MD;  Location: [facility];  Service: Gynecology;  Laterality: N/A;   WISDOM TOOTH EXTRACTION     Social History   Tobacco Use   Smoking status: Former    Packs/day: 1    Types: E-cigarettes, Cigarettes    Quit date: 2011    Years since quitting: 13.2   Smokeless tobacco: Never   Tobacco comments:    She is vaping, former smoker    Scientific laboratory technician Use: Every day  Substance Use Topics   Alcohol use: No    Alcohol/week: 0.0 standard drinks of alcohol   Drug use: No   Family History  Problem Relation Age of Onset   Hypertension Mother    Stroke Mother    Alcohol abuse Father    Cancer Father        lung CA   Heart disease Father        CHF   Cancer Sister        breast CA   Heart disease Sister        CHF from chemotx also has defib   Breast cancer Sister    Colon cancer Neg Hx    Esophageal cancer Neg Hx    Rectal cancer Neg Hx    Stomach cancer Neg Hx    Allergies  Allergen Reactions   Abilify [Aripiprazole]     Vision problem   Ambien [Zolpidem]     Hallucinations Ms change    Codeine Nausea And Vomiting    REACTION: Nausea and vomiting Pt has tolerated vicodin & percocet in the past   Cymbalta  [Duloxetine Hcl]     More depressed/tearful    Lipitor [Atorvastatin]     Muscle pain    Vesicare [Solifenacin]     MS change   Wellbutrin [Bupropion]    Current Outpatient Medications on File Prior to  Visit  Medication Sig Dispense Refill   acetaminophen (TYLENOL) 500 MG tablet Take 500 mg by mouth every 6 (six) hours as needed.     albuterol (VENTOLIN HFA) 108 (90 Base) MCG/ACT inhaler Inhale 2 puffs into the lungs every 6 (six) hours as needed for wheezing or shortness of breath. 8 g 2   apixaban (ELIQUIS) 5 MG TABS tablet TAKE 1 TABLET BY MOUTH TWICE A DAY 60 tablet 5   busPIRone (BUSPAR) 30 MG tablet TAKE ONE HALF TO 1 TABLET BY MOUTH 3 TIMES DAILY AS DIRECTED 270 tablet 1   cyclobenzaprine (FLEXERIL) 10 MG tablet Take 10 mg by mouth 3 (three) times daily as needed for muscle spasms. 0.5-1 tablets     DULoxetine (CYMBALTA) 60 MG capsule Take 60 mg by mouth daily.     flecainide (TAMBOCOR) 100 MG tablet Take 1 tablet (100 mg total) by mouth 2 (two) times daily. 180 tablet 3   Fluticasone-Umeclidin-Vilant (TRELEGY ELLIPTA) 100-62.5-25 MCG/ACT AEPB Inhale 1 puff into the lungs daily. 28 each 11   Fluticasone-Umeclidin-Vilant (TRELEGY ELLIPTA) 100-62.5-25 MCG/ACT AEPB Inhale 1 puff into the lungs daily. 14 each 0   metoprolol succinate (TOPROL-XL) 50 MG 24 hr tablet TAKE 1 TABLET BY MOUTH TWICE A DAY WITH MEALS 180 tablet 1   PARoxetine (PAXIL) 10 MG tablet Take 1 tablet by mouth daily.     potassium chloride (KLOR-CON) 10 MEQ tablet Take 2 tablets (20 mEq total) by mouth daily. 180 tablet 3   rosuvastatin (CRESTOR) 10 MG tablet TAKE 1 TABLET BY MOUTH ONCE A DAY 90 tablet 3   traMADol (ULTRAM) 50 MG tablet TAKE 1-2 TABLETS BY MOUTH EVERY 8 HOURS AS NEEDED FOR MODERATE PAIN 90 tablet 0   diltiazem (CARDIZEM CD) 120 MG 24 hr capsule Take 1 capsule (120 mg total) by mouth daily. 90 capsule 3   furosemide (LASIX) 20 MG tablet Take 1 tablet (20 mg total) by mouth daily. 90 tablet 3    hydrOXYzine (VISTARIL) 25 MG capsule Take 25 mg by mouth 2 (two) times daily. (Patient not taking: Reported on 05/26/2022)     No current facility-administered medications on file prior to visit.    Review of Systems  Constitutional:  Negative for activity change, appetite change, fatigue, fever and unexpected weight change.  HENT:  Negative for congestion, ear pain, rhinorrhea, sinus pressure and sore throat.   Eyes:  Negative for pain, redness and visual disturbance.  Respiratory:  Negative for cough, shortness of breath and wheezing.   Cardiovascular:  Negative for chest pain and palpitations.  Gastrointestinal:  Negative for abdominal pain, blood in stool, constipation and diarrhea.  Endocrine: Negative for polydipsia and polyuria.  Genitourinary:  Negative for dysuria, frequency and urgency.  Musculoskeletal:  Positive for back pain and gait problem. Negative for arthralgias and myalgias.  Skin:  Negative for pallor and rash.  Allergic/Immunologic: Negative for environmental allergies.  Neurological:  Negative for dizziness, syncope and headaches.  Hematological:  Negative for adenopathy. Does not bruise/bleed easily.  Psychiatric/Behavioral:  Positive for dysphoric mood. Negative for behavioral problems, confusion and decreased concentration. The patient is nervous/anxious.        Objective:   Physical Exam Constitutional:      General: She is not in acute distress.    Appearance: Normal appearance. She is well-developed. She is obese. She is not ill-appearing or diaphoretic.  HENT:     Head: Normocephalic and atraumatic.  Eyes:     Conjunctiva/sclera: Conjunctivae normal.  Pupils: Pupils are equal, round, and reactive to light.  Neck:     Thyroid: No thyromegaly.     Vascular: No carotid bruit or JVD.  Cardiovascular:     Rate and Rhythm: Normal rate and regular rhythm.     Heart sounds: Normal heart sounds.     No gallop.  Pulmonary:     Effort: Pulmonary effort is  normal. No respiratory distress.     Breath sounds: Normal breath sounds. No wheezing or rales.  Abdominal:     General: There is no distension or abdominal bruit.     Palpations: Abdomen is soft.  Musculoskeletal:     Cervical back: Normal range of motion and neck supple.     Right lower leg: No edema.     Left lower leg: No edema.     Comments: Poor rom LS  Gait is labored due to back pain today  Lymphadenopathy:     Cervical: No cervical adenopathy.  Skin:    General: Skin is warm and dry.     Coloration: Skin is not pale.     Findings: No rash.  Neurological:     Mental Status: She is alert and oriented to person, place, and time.     Cranial Nerves: No cranial nerve deficit.     Motor: No weakness.     Coordination: Coordination normal.     Deep Tendon Reflexes: Reflexes are normal and symmetric. Reflexes normal.  Psychiatric:        Mood and Affect: Mood normal.     Comments: Much improved mood   MMSE score 27 out of 30 today  Nl clock draw           Assessment & Plan:   Problem List Items Addressed This Visit       Nervous and Auditory   RESOLVED: Sleep walking    This has stopped off ambien  Also holding hydroxyzine unless abs [facility] psychiatry f/u         Musculoskeletal and Integument   DDD (degenerative disc disease), lumbar    Limits mobility  Now that her mental status is better we can add back prn flexeril and tramadol with great caution  Family will watch her  Handicapped parking form done today        Genitourinary   Overactive bladder    ? If vesicare added to MS change Stopped it   Sent px for myrbetriq to see if we could get it covered /likely prior auth If able to get will report back re: effectiveness and side eff at 25 mg dose         Other   Depression with anxiety    Doing better after med change from psychiatrist after ms change and confusion and sundowning   Now taking Cymbalta 60 mg  Buspar 30 mg bid  Paxil  10 mg daily in evening   Off remeron  Uses hydroxyzine only when abs necessary -has not needed  Off ambien  Is more calm       Hallucinations - Primary    Resolved off of ambien and mirtazapine   Takes hydroxyzine only when necessary  Holding vesicare   Adding back flexeril and tramadol prn  Will update if these return       Insomnia    Worse insomnia w/o ambien or hydroxyzine or remeron  Ms is better  Is calm despite not sleeping as much      Leukocytosis  Chronic  Lab today  No s/s of infection  Path rev ordered as well       Relevant Orders   Pathologist smear review   CBC with Differential/Platelet   PANIC DISORDER    Improved See a/p for dep/anx Sees psychiatry      Sundowning    No longer sundowning off ambiien/ mirtazapine and vesicare  Also off hydroxyzine but can take prn  MMSE score reassuring with 27/30  Nl clock draw Continue to follow and watch cog status

## 2022-05-26 NOTE — Patient Instructions (Addendum)
I want to try generic myrbetriq instead of the generic vesicare  Vesicare may add to confusion and mental status change  Will send to pharmacy  See if it can be covered   You can try going back to as needed flexeril and tramadol but if your mental status changes let us know   Your memory/cognition screening is reassuring today   Labs for the white blood cell count     Take care of yourself

## 2022-05-26 NOTE — Assessment & Plan Note (Signed)
This has stopped off ambien  Also holding hydroxyzine unless abs [facility] psychiatry f/u

## 2022-05-29 LAB — CBC WITH DIFFERENTIAL/PLATELET
Absolute Monocytes: 1686 cells/uL — ABNORMAL HIGH (ref 200–950)
Basophils Absolute: 29 cells/uL (ref 0–200)
Basophils Relative: 0.4 %
Eosinophils Absolute: 29 cells/uL (ref 15–500)
Eosinophils Relative: 0.4 %
HCT: 36.9 % (ref 35.0–45.0)
Hemoglobin: 12.5 g/dL (ref 11.7–15.5)
Lymphs Abs: 1716 cells/uL (ref 850–3900)
MCH: 29.6 pg (ref 27.0–33.0)
MCHC: 33.9 g/dL (ref 32.0–36.0)
MCV: 87.2 fL (ref 80.0–100.0)
MPV: 11.4 fL (ref 7.5–12.5)
Monocytes Relative: 23.1 %
Neutro Abs: 3840 cells/uL (ref 1500–7800)
Neutrophils Relative %: 52.6 %
Platelets: 129 10*3/uL — ABNORMAL LOW (ref 140–400)
RBC: 4.23 10*6/uL (ref 3.80–5.10)
RDW: 12.4 % (ref 11.0–15.0)
Total Lymphocyte: 23.5 %
WBC: 7.3 10*3/uL (ref 3.8–10.8)

## 2022-05-29 LAB — PATHOLOGIST SMEAR REVIEW

## 2022-05-31 NOTE — Progress Notes (Unsigned)
Patient ID: Anita Harrell, female    DOB: 09/22/1950, 72 y.o.   MRN: [id number]  HPI  [name] is a 72 y/o female with a history of atrial fibrillation, hyperlipidemia, HTN, thyroid disease, anxiety, COPD, depression, panic attacks and chronic heart failure.   Echo report from 02/14/22 showed an EF of 50-55% along with mild LVH  Stress test 12/21/21 was normal.   Admitted 02/13/22 due to shortness of breath along with dry cough, wheezing, no chest pain, fever or chills. Placed on oxygen due to hypoxia but then weaned off oxygen. Discharged after 2 days.   She presents today for HF follow-up visit with a chief complaint of   Past Medical History:  Diagnosis Date   Allergy    allergic rhinitis   Anxiety    Arrhythmia    atrial fibrillation   Back pain    Chest pain    CHF (congestive heart failure) (HCC)    COPD (chronic obstructive pulmonary disease) (HCC)    Depression    Epigastric pain    Goiter    History of tobacco abuse    Hyperglycemia    mild   Hyperlipidemia    Hypertension    Insomnia    Hx of   Labile blood pressure    Panic disorder    History of   Personal history of colonic polyps 06/09/2004   SVD (spontaneous vaginal delivery)    x 2   Past Surgical History:  Procedure Laterality Date   ABDOMINAL HYSTERECTOMY N/A 09/24/2012   Procedure: HYSTERECTOMY ABDOMINAL;  Surgeon: Gus Height, MD;  Location: [facility];  Service: Gynecology;  Laterality: N/A;   BREAST BIOPSY     CARDIOVERSION N/A 02/09/2022   Procedure: CARDIOVERSION;  Surgeon: Minna Merritts, MD;  Location: [facility];  Service: Cardiovascular;  Laterality: N/A;   CARDIOVERSION N/A 03/02/2022   Procedure: CARDIOVERSION;  Surgeon: Minna Merritts, MD;  Location: [facility];  Service: Cardiovascular;  Laterality: N/A;   COLONOSCOPY     COLONOSCOPY  2017   ELBOW SURGERY  2004   EYE SURGERY     bilateral lasik   SALPINGOOPHORECTOMY Bilateral 09/24/2012   Procedure: SALPINGO OOPHORECTOMY;   Surgeon: Gus Height, MD;  Location: [facility];  Service: Gynecology;  Laterality: Bilateral;   THYROID SURGERY     B9 massess   WART FULGURATION N/A 09/24/2012   Procedure: FULGURATION VAGINAL WART;  Surgeon: Gus Height, MD;  Location: [facility];  Service: Gynecology;  Laterality: N/A;   WISDOM TOOTH EXTRACTION     Family History  Problem Relation Age of Onset   Hypertension Mother    Stroke Mother    Alcohol abuse Father    Cancer Father        lung CA   Heart disease Father        CHF   Cancer Sister        breast CA   Heart disease Sister        CHF from chemotx also has defib   Breast cancer Sister    Colon cancer Neg Hx    Esophageal cancer Neg Hx    Rectal cancer Neg Hx    Stomach cancer Neg Hx    Social History   Tobacco Use   Smoking status: Former    Packs/day: 1    Types: E-cigarettes, Cigarettes    Quit date: 2011    Years since quitting: 13.2   Smokeless tobacco: Never   Tobacco comments:  She is vaping, former smoker    Substance Use Topics   Alcohol use: No    Alcohol/week: 0.0 standard drinks of alcohol   Allergies  Allergen Reactions   Abilify [Aripiprazole]     Vision problem   Ambien [Zolpidem]     Hallucinations Ms change    Codeine Nausea And Vomiting    REACTION: Nausea and vomiting Pt has tolerated vicodin & percocet in the past   Cymbalta [Duloxetine Hcl]     More depressed/tearful    Lipitor [Atorvastatin]     Muscle pain    Vesicare [Solifenacin]     MS change   Wellbutrin [Bupropion]     Review of Systems  Constitutional:  Positive for fatigue. Negative for appetite change.  HENT:  Negative for congestion, postnasal drip and sore throat.   Eyes: Negative.   Respiratory:  Positive for shortness of breath and wheezing. Negative for cough and chest tightness.   Cardiovascular:  Negative for chest pain, palpitations and leg swelling.  Gastrointestinal:  Negative for abdominal distention and abdominal pain.  Endocrine: Negative.    Genitourinary: Negative.   Musculoskeletal:  Positive for back pain. Negative for neck pain.  Skin: Negative.   Allergic/Immunologic: Negative.   Neurological:  Negative for dizziness and light-headedness.  Hematological:  Negative for adenopathy. Bruises/bleeds easily.  Psychiatric/Behavioral:  Negative for dysphoric mood and sleep disturbance (sleeping on 2 pillow). The patient is nervous/anxious.       Physical Exam Vitals and nursing note reviewed. Exam conducted with a chaperone present (husband).  Constitutional:      Appearance: Normal appearance.  HENT:     Head: Normocephalic and atraumatic.  Cardiovascular:     Rate and Rhythm: Normal rate. Rhythm irregular.  Pulmonary:     Effort: Pulmonary effort is normal.     Breath sounds: No wheezing, rhonchi or rales.  Abdominal:     General: There is no distension.     Palpations: Abdomen is soft.     Tenderness: There is no abdominal tenderness.  Musculoskeletal:        General: No tenderness.     Cervical back: Normal range of motion and neck supple.     Right lower leg: No edema.     Left lower leg: No edema.  Skin:    General: Skin is warm and dry.  Neurological:     General: No focal deficit present.     Mental Status: She is alert and oriented to person, place, and time.  Psychiatric:        Mood and Affect: Mood normal.        Behavior: Behavior normal.        Thought Content: Thought content normal.   Assessment & Plan:  1: Chronic heart failure with preserved ejection fraction with structural changes (LVH)- - NYHA class II - euvolemic today - weighing daily; understands to call for an overnight weight gain of > 2 pounds or a weekly weight gain of > 5 pounds - weight 203.8 from last visit here 3 months ago - - not adding salt and is using salt substitute and recently got Mrs. Dash to use  - BNP 02/13/22 was 128.9  2: HTN- - BP  - saw PCP (Tower) 05/26/22 - BMP 02/21/22 reviewed and showed sodium 138,  potassium 3.4, creatinine 0.8 & GFR >60  3: Atrial fibrillation- - saw cardiology Rockey Situ) 04/17/22 - DCCV 03/02/22  4: COPD- - saw pulmonology Patsey Berthold) 02/28/22 - reports having recent "breathing  test"   Med list reviewed.

## 2022-06-01 ENCOUNTER — Encounter: Payer: Self-pay | Admitting: Pharmacist

## 2022-06-01 ENCOUNTER — Encounter: Payer: Self-pay | Admitting: Family

## 2022-06-01 ENCOUNTER — Telehealth: Payer: Self-pay

## 2022-06-01 ENCOUNTER — Ambulatory Visit (HOSPITAL_BASED_OUTPATIENT_CLINIC_OR_DEPARTMENT_OTHER): Payer: Medicare Other | Admitting: Family

## 2022-06-01 ENCOUNTER — Other Ambulatory Visit
Admission: RE | Admit: 2022-06-01 | Discharge: 2022-06-01 | Disposition: A | Discharge status: Home / Self Care | Payer: Medicare Other | Source: Ambulatory Visit | Attending: Family | Admitting: Family

## 2022-06-01 VITALS — BP 125/64 | HR 64 | Resp 16 | Wt 209.0 lb

## 2022-06-01 DIAGNOSIS — I5033 Acute on chronic diastolic (congestive) heart failure: Secondary | ICD-10-CM | POA: Insufficient documentation

## 2022-06-01 DIAGNOSIS — E785 Hyperlipidemia, unspecified: Secondary | ICD-10-CM | POA: Insufficient documentation

## 2022-06-01 DIAGNOSIS — Z79899 Other long term (current) drug therapy: Secondary | ICD-10-CM | POA: Insufficient documentation

## 2022-06-01 DIAGNOSIS — I10 Essential (primary) hypertension: Secondary | ICD-10-CM

## 2022-06-01 DIAGNOSIS — I4891 Unspecified atrial fibrillation: Secondary | ICD-10-CM | POA: Insufficient documentation

## 2022-06-01 DIAGNOSIS — I5032 Chronic diastolic (congestive) heart failure: Secondary | ICD-10-CM | POA: Insufficient documentation

## 2022-06-01 DIAGNOSIS — J449 Chronic obstructive pulmonary disease, unspecified: Secondary | ICD-10-CM | POA: Insufficient documentation

## 2022-06-01 DIAGNOSIS — E079 Disorder of thyroid, unspecified: Secondary | ICD-10-CM | POA: Insufficient documentation

## 2022-06-01 DIAGNOSIS — I110 Hypertensive heart disease with heart failure: Secondary | ICD-10-CM | POA: Insufficient documentation

## 2022-06-01 DIAGNOSIS — I4819 Other persistent atrial fibrillation: Secondary | ICD-10-CM | POA: Diagnosis not present

## 2022-06-01 DIAGNOSIS — Z7901 Long term (current) use of anticoagulants: Secondary | ICD-10-CM | POA: Insufficient documentation

## 2022-06-01 DIAGNOSIS — F419 Anxiety disorder, unspecified: Secondary | ICD-10-CM | POA: Insufficient documentation

## 2022-06-01 DIAGNOSIS — F32A Depression, unspecified: Secondary | ICD-10-CM | POA: Insufficient documentation

## 2022-06-01 LAB — BASIC METABOLIC PANEL
Anion gap: 12 (ref 5–15)
BUN: 9 mg/dL (ref 8–23)
CO2: 26 mmol/L (ref 22–32)
Calcium: 9.2 mg/dL (ref 8.9–10.3)
Chloride: 101 mmol/L (ref 98–111)
Creatinine, Ser: 0.59 mg/dL (ref 0.44–1.00)
GFR, Estimated: >60 mL/min (ref >60)
Glucose, Bld: 152 mg/dL — ABNORMAL HIGH (ref 70–99)
Potassium: 3.2 mmol/L — ABNORMAL LOW (ref 3.5–5.1)
Sodium: 139 mmol/L (ref 135–145)

## 2022-06-01 MED ORDER — METOLAZONE 2.5 MG PO TABS: 2.5000 mg | ORAL_TABLET | Freq: Every day | ORAL | 0 refills | Status: DC

## 2022-06-01 NOTE — Patient Instructions (Signed)
Take the booster fluid pill (metolazone) 1/2 hour before you take your regular fluid pill (lasix). You will do this for today and then again for tomorrow.   For today and tomorrow, please double your potassium from 2 tablets to 4 tablets while taking the metolazone.    Go to the [facility] and get your lab work drawn.

## 2022-06-01 NOTE — Progress Notes (Signed)
REDS VEST READING= 32 CHEST RULER=39  VEST FITTING TASKS: POSTURE=sitting HEIGHT MARKER=B CENTER STRIP=aligned  COMMENTS:

## 2022-06-01 NOTE — Telephone Encounter (Addendum)
Anita Handler, RN     Spoke with pt via phone. Pt aware, agreeable, and verbalized understanding to take 6 potassium tablets today and 6 tomorrow, then resume 2 tablets daily.    Alisa Graff, FNP      Potassium is low and she is going to take 2 days of metolazone. Earlier today, I told her to double her potassium for the 2 days she's taking metolazone but she needs to take 6 potassium tablets today and 6 tomorrow instead. She can then resume her 2 tablets daily. Labs will be checked at her appointment next week.

## 2022-06-07 NOTE — Progress Notes (Signed)
Patient ID: Anita Harrell, female   DOB: 1950/07/11, 72 y.o.   MRN: [id number]  [facility] - PHARMACIST COUNSELING NOTE  *HFpEF%  Guideline-Directed Medical Therapy/Evidence Based Medicine  ACE/ARB/ARNI:  none Beta Blocker: Metoprolol succinate 50 mg daily Aldosterone Antagonist:  none Diuretic: Furosemide 20 mg daily SGLT2i:  none  Adherence Assessment  Do you ever forget to take your medication? [] Yes [x] No  Do you ever skip doses due to side effects? [] Yes [x] No  Do you have trouble affording your medicines? [] Yes [x] No  Are you ever unable to pick up your medication due to transportation difficulties? [] Yes [x] No  Do you ever stop taking your medications because you don't believe they are helping? [] Yes [x] No  Do you check your weight daily? [x] Yes [] No   Adherence strategy: pill box  Barriers to obtaining medications: none  Vital signs: HR 64, BP 125/64, weight (pounds) 209 lb  ECHO: Date 02/14/2022, EF of 50-55% along with mild LVH   ReDS vest 32%     Latest Ref Rng & Units 06/01/2022   10:39 AM 02/21/2022   11:28 AM 02/14/2022    3:47 AM  BMP  Glucose 70 - 99 mg/dL 152  132  153   BUN 8 - 23 mg/dL 9  15  8    Creatinine 0.44 - 1.00 mg/dL 0.59  0.80  0.55   Sodium 135 - 145 mmol/L 139  138  140   Potassium 3.5 - 5.1 mmol/L 3.2  3.4  3.3   Chloride 98 - 111 mmol/L 101  107  106   CO2 22 - 32 mmol/L 26  23  26    Calcium 8.9 - 10.3 mg/dL 9.2  9.4  9.3     Past Medical History:  Diagnosis Date   Allergy    allergic rhinitis   Anxiety    Arrhythmia    atrial fibrillation   Back pain    Chest pain    CHF (congestive heart failure) (HCC)    COPD (chronic obstructive pulmonary disease) (HCC)    Depression    Epigastric pain    Goiter    History of tobacco abuse    Hyperglycemia    mild   Hyperlipidemia    Hypertension    Insomnia    Hx of   Labile blood pressure    Panic disorder    History of    Personal history of colonic polyps 06/09/2004   SVD (spontaneous vaginal delivery)    x 2    ASSESSMENT 72 year old female who presents to the HF clinic for follow up. PMH includes atrial fibrillation, hyperlipidemia, HTN, thyroid disease, anxiety, COPD, depression, panic attacks and chronic heart failure. Last admission to acute care was 03/02/2022 for persistent A.Fib.  Medrec completed during this OV.  Patient denies problems with current therapy but reports weight gain int he last week. Noted she is not voiding as much after taking her furosemide. Strategies to decrease sodium in diet discussed with patient and spouse. No issues to afford therapy reported.  PLAN  Decrease sodium in diet and limit fluid intake per providers instructions Additional diuresis needed. Recommendation given to NP - add metolazone to current diuretic daily x 2-3 days. Repeat BMET in 2 days Follow up as directed by provider   Time spent: 15 minutes  Aloise Copus Rodriguez-Guzman PharmD, BCPS 06/07/2022 10:35 AM   Current Outpatient Medications:    acetaminophen (TYLENOL) 500 MG tablet, Take 500 mg by  mouth every 6 (six) hours as needed., Disp: , Rfl:    albuterol (VENTOLIN HFA) 108 (90 Base) MCG/ACT inhaler, Inhale 2 puffs into the lungs every 6 (six) hours as needed for wheezing or shortness of breath., Disp: 8 g, Rfl: 2   apixaban (ELIQUIS) 5 MG TABS tablet, TAKE 1 TABLET BY MOUTH TWICE A DAY, Disp: 60 tablet, Rfl: 5   busPIRone (BUSPAR) 30 MG tablet, TAKE ONE HALF TO 1 TABLET BY MOUTH 3 TIMES DAILY AS DIRECTED, Disp: 270 tablet, Rfl: 1   cyclobenzaprine (FLEXERIL) 10 MG tablet, Take 10 mg by mouth 3 (three) times daily as needed for muscle spasms. 0.5-1 tablets, Disp: , Rfl:    diltiazem (CARDIZEM CD) 120 MG 24 hr capsule, Take 1 capsule (120 mg total) by mouth daily., Disp: 90 capsule, Rfl: 3   DULoxetine (CYMBALTA) 60 MG capsule, Take 60 mg by mouth daily., Disp: , Rfl:    flecainide (TAMBOCOR) 100 MG  tablet, Take 1 tablet (100 mg total) by mouth 2 (two) times daily., Disp: 180 tablet, Rfl: 3   Fluticasone-Umeclidin-Vilant (TRELEGY ELLIPTA) 100-62.5-25 MCG/ACT AEPB, Inhale 1 puff into the lungs daily., Disp: 28 each, Rfl: 11   Fluticasone-Umeclidin-Vilant (TRELEGY ELLIPTA) 100-62.5-25 MCG/ACT AEPB, Inhale 1 puff into the lungs daily., Disp: 14 each, Rfl: 0   furosemide (LASIX) 20 MG tablet, Take 1 tablet (20 mg total) by mouth daily., Disp: 90 tablet, Rfl: 3   hydrOXYzine (VISTARIL) 25 MG capsule, Take 25 mg by mouth 2 (two) times daily., Disp: , Rfl:    metolazone (ZAROXOLYN) 2.5 MG tablet, Take 1 tablet (2.5 mg total) by mouth daily. Take 1/2 hour before your lasix, Disp: 2 tablet, Rfl: 0   metoprolol succinate (TOPROL-XL) 50 MG 24 hr tablet, TAKE 1 TABLET BY MOUTH TWICE A DAY WITH MEALS, Disp: 180 tablet, Rfl: 1   mirabegron ER (MYRBETRIQ) 25 MG TB24 tablet, Take 1 tablet (25 mg total) by mouth daily., Disp: 90 tablet, Rfl: 3   PARoxetine (PAXIL) 10 MG tablet, Take 1 tablet by mouth daily., Disp: , Rfl:    potassium chloride (KLOR-CON) 10 MEQ tablet, Take 2 tablets (20 mEq total) by mouth daily., Disp: 180 tablet, Rfl: 3   rosuvastatin (CRESTOR) 10 MG tablet, TAKE 1 TABLET BY MOUTH ONCE A DAY, Disp: 90 tablet, Rfl: 3   traMADol (ULTRAM) 50 MG tablet, TAKE 1-2 TABLETS BY MOUTH EVERY 8 HOURS AS NEEDED FOR MODERATE PAIN, Disp: 90 tablet, Rfl: 0    MEDICATION ADHERENCES TIPS AND STRATEGIES Taking medication as prescribed improves patient outcomes in heart failure (reduces hospitalizations, improves symptoms, increases survival) Side effects of medications can be managed by decreasing doses, switching agents, stopping drugs, or adding additional therapy. Please let someone in the [facility] know if you have having bothersome side effects so we can modify your regimen. Do not alter your medication regimen without talking to [place].  Medication reminders can help patients remember to take  drugs on time. If you are missing or forgetting doses you can try linking behaviors, using pill boxes, or an electronic reminder like an alarm on your phone or an app. Some people can also get automated phone calls as medication reminders.

## 2022-06-08 ENCOUNTER — Encounter: Payer: Self-pay | Admitting: Family

## 2022-06-08 ENCOUNTER — Other Ambulatory Visit
Admission: RE | Admit: 2022-06-08 | Discharge: 2022-06-08 | Disposition: A | Discharge status: Home / Self Care | Payer: Medicare Other | Source: Ambulatory Visit | Attending: Family | Admitting: Family

## 2022-06-08 ENCOUNTER — Ambulatory Visit (HOSPITAL_BASED_OUTPATIENT_CLINIC_OR_DEPARTMENT_OTHER): Payer: Medicare Other | Admitting: Family

## 2022-06-08 ENCOUNTER — Other Ambulatory Visit: Payer: Self-pay | Admitting: Family

## 2022-06-08 VITALS — BP 128/60 | HR 60 | Wt 200.0 lb

## 2022-06-08 DIAGNOSIS — I5032 Chronic diastolic (congestive) heart failure: Secondary | ICD-10-CM | POA: Diagnosis not present

## 2022-06-08 DIAGNOSIS — I10 Essential (primary) hypertension: Secondary | ICD-10-CM

## 2022-06-08 DIAGNOSIS — I4819 Other persistent atrial fibrillation: Secondary | ICD-10-CM

## 2022-06-08 DIAGNOSIS — J449 Chronic obstructive pulmonary disease, unspecified: Secondary | ICD-10-CM | POA: Diagnosis not present

## 2022-06-08 LAB — BASIC METABOLIC PANEL
Anion gap: 11 (ref 5–15)
BUN: 9 mg/dL (ref 8–23)
CO2: 25 mmol/L (ref 22–32)
Calcium: 9.3 mg/dL (ref 8.9–10.3)
Chloride: 100 mmol/L (ref 98–111)
Creatinine, Ser: 0.65 mg/dL (ref 0.44–1.00)
GFR, Estimated: >60 mL/min (ref >60)
Glucose, Bld: 152 mg/dL — ABNORMAL HIGH (ref 70–99)
Potassium: 3.4 mmol/L — ABNORMAL LOW (ref 3.5–5.1)
Sodium: 136 mmol/L (ref 135–145)

## 2022-06-08 MED ORDER — POTASSIUM CHLORIDE ER 10 MEQ PO TBCR: 30.0000 meq | EXTENDED_RELEASE_TABLET | Freq: Every day | ORAL | 3 refills | Status: DC

## 2022-06-08 NOTE — Patient Instructions (Signed)
Go to the [facility] to get your lab work drawn today.

## 2022-06-08 NOTE — Progress Notes (Signed)
Patient ID: Anita Harrell, female    DOB: 04/18/1950, 72 y.o.   MRN: [id number]  HPI  Anita Harrell is a 72 y/o female with a history of atrial fibrillation, hyperlipidemia, HTN, thyroid disease, anxiety, COPD, depression, panic attacks and chronic heart failure.   Echo 02/14/22: EF of 50-55% along with mild LVH  Stress test 12/21/21 was normal.   Admitted 02/13/22 due to shortness of breath along with dry cough, wheezing, no chest pain, fever or chills. Placed on oxygen due to hypoxia but then weaned off oxygen. Discharged after 2 days.   She presents today for HF follow-up visit with a chief complaint of minimal SOB w/ moderate exertion. Chronic in nature. Has associated fatigue, dizziness, anxiety and intermittent difficulty sleeping along with this. Denies any abdominal distention, palpitations, pedal edema, chest pain, wheezing, cough or weight gain.   Took metolazone for 2 days with extra potassium since last here and she and her husband feel like she's doing "much better".   Past Medical History:  Diagnosis Date   Allergy    allergic rhinitis   Anxiety    Arrhythmia    atrial fibrillation   Back pain    Chest pain    CHF (congestive heart failure) (HCC)    COPD (chronic obstructive pulmonary disease) (HCC)    Depression    Epigastric pain    Goiter    History of tobacco abuse    Hyperglycemia    mild   Hyperlipidemia    Hypertension    Insomnia    Hx of   Labile blood pressure    Panic disorder    History of   Personal history of colonic polyps 06/09/2004   SVD (spontaneous vaginal delivery)    x 2   Past Surgical History:  Procedure Laterality Date   ABDOMINAL HYSTERECTOMY N/A 09/24/2012   Procedure: HYSTERECTOMY ABDOMINAL;  Surgeon: Gus Height, MD;  Location: [facility];  Service: Gynecology;  Laterality: N/A;   BREAST BIOPSY     CARDIOVERSION N/A 02/09/2022   Procedure: CARDIOVERSION;  Surgeon: Minna Merritts, MD;  Location: [facility];  Service:  Cardiovascular;  Laterality: N/A;   CARDIOVERSION N/A 03/02/2022   Procedure: CARDIOVERSION;  Surgeon: Minna Merritts, MD;  Location: [facility];  Service: Cardiovascular;  Laterality: N/A;   COLONOSCOPY     COLONOSCOPY  2017   ELBOW SURGERY  2004   EYE SURGERY     bilateral lasik   SALPINGOOPHORECTOMY Bilateral 09/24/2012   Procedure: SALPINGO OOPHORECTOMY;  Surgeon: Gus Height, MD;  Location: [facility];  Service: Gynecology;  Laterality: Bilateral;   THYROID SURGERY     B9 massess   WART FULGURATION N/A 09/24/2012   Procedure: FULGURATION VAGINAL WART;  Surgeon: Gus Height, MD;  Location: [facility];  Service: Gynecology;  Laterality: N/A;   WISDOM TOOTH EXTRACTION     Family History  Problem Relation Age of Onset   Hypertension Mother    Stroke Mother    Alcohol abuse Father    Cancer Father        lung CA   Heart disease Father        CHF   Cancer Sister        breast CA   Heart disease Sister        CHF from chemotx also has defib   Breast cancer Sister    Colon cancer Neg Hx    Esophageal cancer Neg Hx    Rectal cancer Neg Hx  Stomach cancer Neg Hx    Social History   Tobacco Use   Smoking status: Former    Packs/day: 1    Types: E-cigarettes, Cigarettes    Quit date: 2011    Years since quitting: 13.2   Smokeless tobacco: Never   Tobacco comments:    She is vaping, former smoker    Substance Use Topics   Alcohol use: No    Alcohol/week: 0.0 standard drinks of alcohol   Allergies  Allergen Reactions   Abilify [Aripiprazole]     Vision problem   Ambien [Zolpidem]     Hallucinations Anita change    Codeine Nausea And Vomiting    REACTION: Nausea and vomiting Pt has tolerated vicodin & percocet in the past   Cymbalta [Duloxetine Hcl]     More depressed/tearful    Lipitor [Atorvastatin]     Muscle pain    Vesicare [Solifenacin]     Anita change   Wellbutrin [Bupropion]    Prior to Admission medications   Medication Sig Start Date End Date Taking? Authorizing  Provider  acetaminophen (TYLENOL) 500 MG tablet Take 500 mg by mouth every 6 (six) hours as needed.   Yes [provider]  albuterol (VENTOLIN HFA) 108 (90 Base) MCG/ACT inhaler Inhale 2 puffs into the lungs every 6 (six) hours as needed for wheezing or shortness of breath. 02/15/22  Yes Wieting, Richard, MD  apixaban (ELIQUIS) 5 MG TABS tablet TAKE 1 TABLET BY MOUTH TWICE A DAY 04/17/22  Yes Gollan, Kathlene November, MD  busPIRone (BUSPAR) 30 MG tablet TAKE ONE HALF TO 1 TABLET BY MOUTH 3 TIMES DAILY AS DIRECTED 11/04/21  Yes Tower, Wynelle Fanny, MD  cyclobenzaprine (FLEXERIL) 10 MG tablet Take 10 mg by mouth 3 (three) times daily as needed for muscle spasms. 0.5-1 tablets   Yes [provider]  diltiazem (CARDIZEM CD) 120 MG 24 hr capsule Take 1 capsule (120 mg total) by mouth daily. 02/21/22 06/08/22 Yes Dunn, Areta Haber, PA-C  DULoxetine (CYMBALTA) 60 MG capsule Take 60 mg by mouth daily. 04/11/22  Yes [provider]  flecainide (TAMBOCOR) 100 MG tablet Take 1 tablet (100 mg total) by mouth 2 (two) times daily. 12/12/21  Yes Gollan, Kathlene November, MD  Fluticasone-Umeclidin-Vilant (TRELEGY ELLIPTA) 100-62.5-25 MCG/ACT AEPB Inhale 1 puff into the lungs daily. 02/28/22  Yes Tyler Pita, MD  Fluticasone-Umeclidin-Vilant (TRELEGY ELLIPTA) 100-62.5-25 MCG/ACT AEPB Inhale 1 puff into the lungs daily. 02/28/22  Yes Tyler Pita, MD  furosemide (LASIX) 20 MG tablet Take 1 tablet (20 mg total) by mouth daily. 02/24/22 06/08/22 Yes Dunn, Areta Haber, PA-C  hydrOXYzine (VISTARIL) 25 MG capsule Take 25 mg by mouth 2 (two) times daily. 04/11/22  Yes Doles-Johnson, Teah, NP  metoprolol succinate (TOPROL-XL) 50 MG 24 hr tablet TAKE 1 TABLET BY MOUTH TWICE A DAY WITH MEALS 04/17/22  Yes Gollan, Kathlene November, MD  mirabegron ER (MYRBETRIQ) 25 MG TB24 tablet Take 1 tablet (25 mg total) by mouth daily. 05/26/22  Yes Tower, Wynelle Fanny, MD  PARoxetine (PAXIL) 10 MG tablet Take 1 tablet by mouth daily.   Yes Doles-Johnson,  Teah, NP  potassium chloride (KLOR-CON) 10 MEQ tablet Take 2 tablets (20 mEq total) by mouth daily. 04/17/22  Yes Minna Merritts, MD  rosuvastatin (CRESTOR) 10 MG tablet TAKE 1 TABLET BY MOUTH ONCE A DAY 04/06/22  Yes Tower, Marne A, MD  traMADol (ULTRAM) 50 MG tablet TAKE 1-2 TABLETS BY MOUTH EVERY 8 HOURS AS NEEDED FOR  MODERATE PAIN 04/06/22  Yes Tower, Wynelle Fanny, MD    Review of Systems  Constitutional:  Positive for fatigue. Negative for appetite change.  HENT:  Positive for congestion and rhinorrhea. Negative for postnasal drip and sore throat.   Eyes: Negative.   Respiratory:  Positive for shortness of breath. Negative for cough, chest tightness and wheezing.   Cardiovascular:  Negative for chest pain, palpitations and leg swelling.  Gastrointestinal:  Negative for abdominal distention and abdominal pain.  Endocrine: Negative.   Genitourinary: Negative.   Musculoskeletal:  Positive for back pain. Negative for neck pain.  Skin: Negative.   Allergic/Immunologic: Negative.   Neurological:  Positive for dizziness. Negative for light-headedness.  Hematological:  Negative for adenopathy. Bruises/bleeds easily.  Psychiatric/Behavioral:  Positive for sleep disturbance (not sleeping well; unknown reason). Negative for dysphoric mood. The patient is nervous/anxious.    Vitals:   06/08/22 0858  BP: 128/60  Pulse: 60  SpO2: 94%  Weight: 200 lb (90.7 kg)   Wt Readings from Last 3 Encounters:  06/08/22 200 lb (90.7 kg)  06/01/22 209 lb (94.8 kg)  05/26/22 208 lb (94.3 kg)   Lab Results  Component Value Date   CREATININE 0.59 06/01/2022   CREATININE 0.80 02/21/2022   CREATININE 0.55 02/14/2022   Physical Exam Vitals and nursing note reviewed. Exam conducted with a chaperone present (husband).  Constitutional:      Appearance: Normal appearance.  HENT:     Head: Normocephalic and atraumatic.  Cardiovascular:     Rate and Rhythm: Normal rate and regular rhythm.  Pulmonary:      Effort: Pulmonary effort is normal.     Breath sounds: No wheezing, rhonchi or rales.  Abdominal:     General: There is no distension.     Palpations: Abdomen is soft.     Tenderness: There is no abdominal tenderness.  Musculoskeletal:        General: No tenderness.     Cervical back: Normal range of motion and neck supple.     Right lower leg: No tenderness. No edema.     Left lower leg: No tenderness. No edema.  Skin:    General: Skin is warm and dry.  Neurological:     General: No focal deficit present.     Mental Status: She is alert and oriented to person, place, and time.  Psychiatric:        Mood and Affect: Mood normal.        Behavior: Behavior normal.        Thought Content: Thought content normal.   Assessment & Plan:  1: NICM with preserved ejection fraction with LVH- - NYHA class II - euvolemic today - weighing daily; understands to call for an overnight weight gain of > 2 pounds or a weekly weight gain of > 5 pounds - weight down 9 pounds from last visit here 1 week ago - echo 02/14/22: EF of 50-55% along with mild LVH - stress test 12/21/21 was normal.  - not adding salt and is using salt substitute/ Mrs Deliah Boston - continue furosemide 20mg  daily/ potassium 28meq daily - continue metoprolol succinate 50mg  daily - took metolazone 2.5mg  for 2 days along w/ extra potassium - BMP today  - BNP 02/13/22 was 128.9  2: HTN- - BP 128/60 - saw PCP (Tower) 05/26/22 - BMP 06/01/22 reviewed and showed sodium 139, potassium 3.2, creatinine 0.59 & GFR >60 - BMP today  3: Atrial fibrillation- - saw cardiology Rockey Situ) 04/17/22 - DCCV  03/02/22 - apixaban 5mg  BID - diltiazem 120mg  daily - flecainide 100mg  BID  4: COPD- - saw pulmonology Patsey Berthold) 02/28/22 - had PFT's done 02/23/22  Return in 2 months, sooner if needed.

## 2022-06-15 DIAGNOSIS — F332 Major depressive disorder, recurrent severe without psychotic features: Secondary | ICD-10-CM | POA: Diagnosis not present

## 2022-06-15 DIAGNOSIS — F5101 Primary insomnia: Secondary | ICD-10-CM | POA: Diagnosis not present

## 2022-06-15 DIAGNOSIS — F411 Generalized anxiety disorder: Secondary | ICD-10-CM | POA: Diagnosis not present

## 2022-06-19 ENCOUNTER — Telehealth: Payer: Self-pay

## 2022-06-19 NOTE — Progress Notes (Signed)
Care Management & Coordination Services Pharmacy Team  Reason for Encounter: Appointment Reminder  Contacted patient to confirm telephone appointment with Al Corpus , PharmD on 06/22/22 at 11:45. Spoke with patient on 06/19/2022   Do you have any problems getting your medications? No  What is your top health concern you would like to discuss at your upcoming visit? No concerns mentioned  Have you seen any other providers since your last visit with PCP? Yes- cardiology    Hospital visits:  None in previous 6 months   Star Rating Drugs:  Medication:  Last Fill: Day Supply Rosuvastatin 10mg  04/06/22 90  Care Gaps: Annual wellness visit in last year? Yes   Al Corpus, PharmD notified  Burt Knack, [facility] Clinical Pharmacy Assistant [phone]

## 2022-06-20 ENCOUNTER — Telehealth: Payer: Self-pay | Admitting: Family Medicine

## 2022-06-20 MED ORDER — PREDNISONE 10 MG PO TABS: ORAL_TABLET | ORAL | 0 refills | Status: DC

## 2022-06-20 NOTE — Telephone Encounter (Signed)
Patient was prescribed  traMADol (ULTRAM) 50 MG tablet  for her severe pain from arthritis and sciatic nerve in her back. She called in today stating that the tramadol isn't helping her and she's in so much pain. She said that she can barely walk,all she can do is sit on an ice pack to help relieve pain. She was sobbing really bad while on the phone with me.She would like to know if theres anything else that can be prescribed to her for pain?

## 2022-06-20 NOTE — Telephone Encounter (Signed)
Pt notified of Dr. Royden Purl comments and f/u appt scheduled tomorrow. Pt does want to go ahead and start prednisone. Please send to [facility]

## 2022-06-20 NOTE — Telephone Encounter (Signed)
Needs a visit  May need to consider MRI or other testing or orthopedics (unless she is already going)   I want to do a course of prednisone if she can tolerate it and not already on it to see if we can calm this down-if that is ok where to send?

## 2022-06-21 ENCOUNTER — Ambulatory Visit (INDEPENDENT_AMBULATORY_CARE_PROVIDER_SITE_OTHER): Payer: Medicare Other | Admitting: Family Medicine

## 2022-06-21 ENCOUNTER — Encounter: Payer: Self-pay | Admitting: Family Medicine

## 2022-06-21 VITALS — BP 126/70 | HR 73 | Temp 97.8°F | Ht 63.0 in | Wt 201.5 lb

## 2022-06-21 DIAGNOSIS — M5136 Other intervertebral disc degeneration, lumbar region: Secondary | ICD-10-CM

## 2022-06-21 MED ORDER — TRAMADOL HCL 50 MG PO TABS: ORAL_TABLET | ORAL | 0 refills | Status: DC

## 2022-06-21 NOTE — Patient Instructions (Signed)
I want to order MRI - will preference [place] if possible  If you don't get a call in 1-2 weeks let us know   Take tramadol as needed  Flexeril (muscle relaxer) as needed with caution of sedation and falls - start with bedtime if needed  Ice and heat are ok as needed   Finish the prednisone   If things worsen or if you loose the ability to raise your right foot- let us know and go to ER if necessary

## 2022-06-21 NOTE — Assessment & Plan Note (Addendum)
Fairly severe on LS xray  Going on for years and starting to flare more often and cause more motility problems  Did not tolerate PT well  Started a course of prednisone for unbearable pain yesterday Has tramadol (disc sedation/fall risk/ habit) Has flexeril (also disc sed and fall risk ) Would rather avoid narcotics if possible  Options next include further eval with MRI, consider spine clinic or multidisc pain clinic  May be app for epidural injections depending on level of issue   Will start with MRI order- pt is ok with this  Disc sympt management  Disc ER precautions Disc neuro changes to watch for

## 2022-06-21 NOTE — Progress Notes (Signed)
Subjective:    Patient ID: Anita Harrell, female    DOB: 1951/01/12, 72 y.o.   MRN: [id number]  HPI Pt presents for ongoing back pain   Wt Readings from Last 3 Encounters:  06/21/22 201 lb 8 oz (91.4 kg)  06/08/22 200 lb (90.7 kg)  06/01/22 209 lb (94.8 kg)   35.69 kg/m  Vitals:   06/21/22 1159  BP: 126/70  Pulse: 73  Temp: 97.8 F (36.6 C)  SpO2: 95%      Saw Dr Patsy Lager in the past for chronic R sided low back pain  Given round of prednisone and flexeril and tramadol in November 2023  Was ref to PT  Last LS film: DG Lumbar Spine Complete (Accession [id number]) (Order [id number]) Imaging Date: 01/26/2022 Department: [facility] [address] Released By: Ronalee Red, RT Authorizing: Hannah Beat, MD   Exam Status  Status  Final [99]   PACS Intelerad Image Link   Show images for DG Lumbar Spine Complete Study Result  Narrative & Impression  CLINICAL DATA:  Degenerative disc disease.  Chronic low back pain.   EXAM: LUMBAR SPINE - COMPLETE 4+ VIEW   COMPARISON:  CT abdomen pelvis February 16, 2015   FINDINGS: Mild leftward curvature of the mid lumbar spine. Oblique views are somewhat limited. Grade 1 anterolisthesis of L5 on S1 likely secondary to extensive L5-S1 facet degenerative changes. Retrolisthesis of L2 on L3. Multilevel degenerative disc disease at each level with disc space narrowing and endplate osteophytosis. Extensive multilevel facet degenerative changes most pronounced L4-5 and L5-S1. Aortic atherosclerosis. Right upper quadrant calcification, nonspecific, potentially cholelithiasis.   IMPRESSION: Extensive multilevel degenerative disc and facet disease most pronounced L4-5 and L5-S1.   Grade 1 anterolisthesis L5 on S1.     Disc MRI or injections   Has lumbosacral DDD Did PT in January-she quit after one visit  At that time more R sided with RLE radiculopathy   Pt called yesterday We sent in some  prednisone to start    This flare started last week (did not do anything out of the ordinary)  Pain is worse on the R and it goes down the leg  Also rad to L back  Not to L leg   No numbness in foot   Patient Active Problem List   Diagnosis Date Noted   Sundowning 05/17/2022   Hallucinations 05/17/2022   Polypharmacy 05/17/2022   General weakness 02/16/2022   Poor balance 02/16/2022   Falling 02/16/2022   COPD exacerbation 02/13/2022   Fall at home, initial encounter 02/13/2022   Hypokalemia 02/13/2022   HTN (hypertension) 02/13/2022   Acute on chronic diastolic CHF (congestive heart failure) 02/13/2022   DDD (degenerative disc disease), lumbar 12/26/2021   Bilateral hip bursitis 10/05/2021   Atrial fibrillation with RVR 10/05/2021   Cerumen impaction 06/21/2021   Estrogen deficiency 06/07/2021   Overactive bladder 10/17/2019   Medicare annual wellness visit, subsequent 01/16/2019   Thyroid nodule 05/01/2018   Electronic cigarette use 04/03/2018   Obesity (BMI 30-39.9) 04/03/2018   Lung mass 02/01/2018   Abnormal chest x-ray 01/29/2018   Smokers' cough 01/01/2018   Microscopic hematuria 08/13/2012   Routine general medical examination at a health care facility 07/22/2012   Leukocytosis 03/30/2010   Prediabetes 02/21/2008   HYPERTENSION, BENIGN ESSENTIAL 01/22/2008   ALLERGIC RHINITIS 07/25/2007   Low back pain 06/26/2007   Personal history of goiter 07/27/2006   Hyperlipidemia 07/27/2006   PANIC DISORDER 07/27/2006   Former  smoker 07/27/2006   Depression with anxiety 07/27/2006   Insomnia 07/27/2006   Personal history of colonic polyps 06/09/2004   Past Medical History:  Diagnosis Date   Allergy    allergic rhinitis   Anxiety    Arrhythmia    atrial fibrillation   Back pain    Chest pain    CHF (congestive heart failure)    COPD (chronic obstructive pulmonary disease)    Depression    Epigastric pain    Goiter    History of tobacco abuse     Hyperglycemia    mild   Hyperlipidemia    Hypertension    Insomnia    Hx of   Labile blood pressure    Panic disorder    History of   Personal history of colonic polyps 06/09/2004   SVD (spontaneous vaginal delivery)    x 2   Past Surgical History:  Procedure Laterality Date   ABDOMINAL HYSTERECTOMY N/A 09/24/2012   Procedure: HYSTERECTOMY ABDOMINAL;  Surgeon: Miguel Aschoff, MD;  Location: [facility];  Service: Gynecology;  Laterality: N/A;   BREAST BIOPSY     CARDIOVERSION N/A 02/09/2022   Procedure: CARDIOVERSION;  Surgeon: Antonieta Iba, MD;  Location: [facility];  Service: Cardiovascular;  Laterality: N/A;   CARDIOVERSION N/A 03/02/2022   Procedure: CARDIOVERSION;  Surgeon: Antonieta Iba, MD;  Location: [facility];  Service: Cardiovascular;  Laterality: N/A;   COLONOSCOPY     COLONOSCOPY  2017   ELBOW SURGERY  2004   EYE SURGERY     bilateral lasik   SALPINGOOPHORECTOMY Bilateral 09/24/2012   Procedure: SALPINGO OOPHORECTOMY;  Surgeon: Miguel Aschoff, MD;  Location: [facility];  Service: Gynecology;  Laterality: Bilateral;   THYROID SURGERY     B9 massess   WART FULGURATION N/A 09/24/2012   Procedure: FULGURATION VAGINAL WART;  Surgeon: Miguel Aschoff, MD;  Location: [facility];  Service: Gynecology;  Laterality: N/A;   WISDOM TOOTH EXTRACTION     Social History   Tobacco Use   Smoking status: Former    Packs/day: 1    Types: E-cigarettes, Cigarettes    Quit date: 2011    Years since quitting: 13.2   Smokeless tobacco: Never   Tobacco comments:    She is vaping, former smoker    Building services engineer Use: Every day  Substance Use Topics   Alcohol use: No    Alcohol/week: 0.0 standard drinks of alcohol   Drug use: No   Family History  Problem Relation Age of Onset   Hypertension Mother    Stroke Mother    Alcohol abuse Father    Cancer Father        lung CA   Heart disease Father        CHF   Cancer Sister        breast CA   Heart disease Sister        CHF from chemotx  also has defib   Breast cancer Sister    Colon cancer Neg Hx    Esophageal cancer Neg Hx    Rectal cancer Neg Hx    Stomach cancer Neg Hx    Allergies  Allergen Reactions   Abilify [Aripiprazole]     Vision problem   Ambien [Zolpidem]     Hallucinations Ms change    Codeine Nausea And Vomiting    REACTION: Nausea and vomiting Pt has tolerated vicodin & percocet in the past   Cymbalta [Duloxetine Hcl]  More depressed/tearful    Lipitor [Atorvastatin]     Muscle pain    Vesicare [Solifenacin]     MS change   Wellbutrin [Bupropion]    Current Outpatient Medications on File Prior to Visit  Medication Sig Dispense Refill   acetaminophen (TYLENOL) 500 MG tablet Take 500 mg by mouth every 6 (six) hours as needed.     albuterol (VENTOLIN HFA) 108 (90 Base) MCG/ACT inhaler Inhale 2 puffs into the lungs every 6 (six) hours as needed for wheezing or shortness of breath. 8 g 2   apixaban (ELIQUIS) 5 MG TABS tablet TAKE 1 TABLET BY MOUTH TWICE A DAY 60 tablet 5   busPIRone (BUSPAR) 30 MG tablet TAKE ONE HALF TO 1 TABLET BY MOUTH 3 TIMES DAILY AS DIRECTED 270 tablet 1   cyclobenzaprine (FLEXERIL) 10 MG tablet Take 10 mg by mouth 3 (three) times daily as needed for muscle spasms. 0.5-1 tablets     diltiazem (CARDIZEM CD) 120 MG 24 hr capsule Take 1 capsule (120 mg total) by mouth daily. 90 capsule 3   DULoxetine (CYMBALTA) 60 MG capsule Take 60 mg by mouth daily.     flecainide (TAMBOCOR) 100 MG tablet Take 1 tablet (100 mg total) by mouth 2 (two) times daily. 180 tablet 3   Fluticasone-Umeclidin-Vilant (TRELEGY ELLIPTA) 100-62.5-25 MCG/ACT AEPB Inhale 1 puff into the lungs daily. 28 each 11   Fluticasone-Umeclidin-Vilant (TRELEGY ELLIPTA) 100-62.5-25 MCG/ACT AEPB Inhale 1 puff into the lungs daily. 14 each 0   hydrOXYzine (VISTARIL) 25 MG capsule Take 25 mg by mouth 2 (two) times daily.     metoprolol succinate (TOPROL-XL) 50 MG 24 hr tablet TAKE 1 TABLET BY MOUTH TWICE A DAY WITH MEALS  180 tablet 1   mirabegron ER (MYRBETRIQ) 25 MG TB24 tablet Take 1 tablet (25 mg total) by mouth daily. 90 tablet 3   PARoxetine (PAXIL) 10 MG tablet Take 1 tablet by mouth daily.     potassium chloride (KLOR-CON) 10 MEQ tablet Take 3 tablets (30 mEq total) by mouth daily. 270 tablet 3   predniSONE (DELTASONE) 10 MG tablet Take 4 pills once daily by mouth for 3 days, then 3 pills daily for 3 days, then 2 pills daily for 3 days then 1 pill daily for 3 days then stop 30 tablet 0   rosuvastatin (CRESTOR) 10 MG tablet TAKE 1 TABLET BY MOUTH ONCE A DAY 90 tablet 3   furosemide (LASIX) 20 MG tablet Take 1 tablet (20 mg total) by mouth daily. 90 tablet 3   No current facility-administered medications on file prior to visit.     Review of Systems  Constitutional:  Negative for activity change, appetite change, fatigue, fever and unexpected weight change.  HENT:  Negative for congestion, ear pain, rhinorrhea, sinus pressure and sore throat.   Eyes:  Negative for pain, redness and visual disturbance.  Respiratory:  Negative for cough, shortness of breath and wheezing.   Cardiovascular:  Negative for chest pain and palpitations.  Gastrointestinal:  Negative for abdominal pain, blood in stool, constipation and diarrhea.  Endocrine: Negative for polydipsia and polyuria.  Genitourinary:  Negative for dysuria, frequency and urgency.  Musculoskeletal:  Positive for back pain and gait problem. Negative for arthralgias, joint swelling and myalgias.  Skin:  Negative for pallor and rash.  Allergic/Immunologic: Negative for environmental allergies.  Neurological:  Negative for dizziness, syncope and headaches.  Hematological:  Negative for adenopathy. Does not bruise/bleed easily.  Psychiatric/Behavioral:  Positive for sleep disturbance.  Negative for decreased concentration and dysphoric mood. The patient is nervous/anxious.        Objective:   Physical Exam Constitutional:      General: She is not in  acute distress.    Appearance: Normal appearance. She is obese. She is not ill-appearing or diaphoretic.     Comments: Uncomfortable today  Eyes:     Conjunctiva/sclera: Conjunctivae normal.     Pupils: Pupils are equal, round, and reactive to light.  Cardiovascular:     Rate and Rhythm: Normal rate and regular rhythm.  Pulmonary:     Effort: Pulmonary effort is normal. No respiratory distress.     Breath sounds: Normal breath sounds. No wheezing.  Musculoskeletal:     Lumbar back: Spasms, tenderness and bony tenderness present. No swelling, deformity, signs of trauma or lacerations. Decreased range of motion. Positive right straight leg raise test. Negative left straight leg raise test.     Comments: LS Some loss of lordosis Very limited flex/ext due to pain  Gait is slow and labored  Tender over L4, L5 today  Some tenderness over L SI joint  Bent knee raise on R is pos for some leg and buttock pain  Hip flexor weakness vs hesitancy from pain = difficult to tell  Skin:    General: Skin is warm and dry.     Findings: No bruising, erythema or rash.  Neurological:     Mental Status: She is alert.     Sensory: No sensory deficit.     Motor: No weakness.     Gait: Gait abnormal.     Deep Tendon Reflexes: Reflexes normal.     Comments: Some weakness vs hesitancy of hip flexion due to pain    Psychiatric:        Mood and Affect: Affect is tearful.           Assessment & Plan:   Problem List Items Addressed This Visit       Musculoskeletal and Integument   DDD (degenerative disc disease), lumbar - Primary    Fairly severe on LS xray  Going on for years and starting to flare more often and cause more motility problems  Did not tolerate PT well  Started a course of prednisone for unbearable pain yesterday Has tramadol (disc sedation/fall risk/ habit) Has flexeril (also disc sed and fall risk ) Would rather avoid narcotics if possible  Options next include further eval  with MRI, consider spine clinic or multidisc pain clinic  May be app for epidural injections depending on level of issue   Will start with MRI order- pt is ok with this  Disc sympt management  Disc ER precautions Disc neuro changes to watch for        Relevant Medications   traMADol (ULTRAM) 50 MG tablet   Other Relevant Orders   MR Lumbar Spine Wo Contrast

## 2022-06-22 ENCOUNTER — Ambulatory Visit
Admission: RE | Admit: 2022-06-22 | Discharge: 2022-06-22 | Disposition: A | Discharge status: Home / Self Care | Payer: Medicare Other | Source: Ambulatory Visit | Attending: Family Medicine | Admitting: Family Medicine

## 2022-06-22 ENCOUNTER — Ambulatory Visit: Payer: Medicare Other | Admitting: Pharmacist

## 2022-06-22 DIAGNOSIS — M48061 Spinal stenosis, lumbar region without neurogenic claudication: Secondary | ICD-10-CM | POA: Diagnosis not present

## 2022-06-22 DIAGNOSIS — M5136 Other intervertebral disc degeneration, lumbar region: Secondary | ICD-10-CM

## 2022-06-22 DIAGNOSIS — M545 Low back pain, unspecified: Secondary | ICD-10-CM | POA: Diagnosis not present

## 2022-06-22 NOTE — Progress Notes (Signed)
Care Management & Coordination Services Pharmacy Note  06/22/2022 Name:  Anita Harrell MRN:  [id number] DOB:  Sep 18, 1950  Summary: F/U visit -Hallucination/cognitive concerns: pt reports no further issues since stopping zolpidem; she also stopped solifenacin and started Myrbetriq due to anticholinergic concerns  Recommendations/Changes made from today's visit: -No med changes  Follow up plan: -Pharmacist follow up PRN -Pulmonary appt 06/23/22; Neurology appt 07/11/22; HF clinic 08/09/22    Subjective: Anita Harrell is an 72 y.o. year old female who is a primary patient of Tower, Audrie Gallus, MD.  The care coordination team was consulted for assistance with disease management and care coordination needs.    Engaged with patient face to face for initial visit. Patient lives at home with her husband, Dorene Sorrow. Her husband handles her medications, sets up pill box for her each week.   Recent office visits: 06/21/22 Dr Milinda Antis OV: back pain - ordered MRI. Prednisone course 05/26/22 Dr Milinda Antis OV: hallucinations - improved. Agree stop solifenacin. Start Myrbetriq 25 mg.  05/17/22 Dr Milinda Antis OV: depression/anxiety - worsening anxiety, panic, hallucinations. Stopped ambien. Tapering paxil. F/u with psych. Referred to neuro. Hold tramadol and flexeril.   02/16/22 Dr Milinda Antis OV: Hospital f/u - AECOPD. Advised to quit vaping. Referred to PT. F/u with psych - discuss Remus Loffler, may be adding to falls.  01/26/22 Dr Patsy Lager OV: chronic low back pain - consider MRI, epidural steroid injection. Start tramadol today.  Recent consult visits: 06/08/22 NP Hackney (HF clinic): Wt 200#. Improved w/ metolazone. D/c metolazone. K 3.4 - take 3 Kcl daily.  06/01/22 NP Hackney (HF clinic): Wt 209#. Rx metolazone x 2 days. K 3.2 - Triple Kcl x 2 days.  04/17/22 Dr Mariah Milling (Cardiology): HF, Afib. In NSR. SOB - rec lasix 20 daily and Kcl 20 daily.   02/28/22 Dr Jayme Cloud (Pulm): COPD/asthma - d/c symbicort. Start Trelegy Advised  to stop vaping.   02/24/22 NP Hackney (HF clinic): HFpEF - weigh daily. Decrease lasix to 20 daily (1/2 tab).  02/21/22 PA Dunn (Cardiology): Hospital f/u (acute HF and AECOPD). In Afib. Add Diltiazem 120 mg daily.  02/01/22 Dr Mariah Milling (Cardiology): Afib, HF. Zio monitor confirmed persistent afib. Set up cardioversion. SOB - add Lasix 20 mg and Kcl 10 mg.   Hospital visits: 03/02/22 Admission [facility]): cardioversion  02/13/22 - 02/15/22 Admission [facility]): Acute resp failure, CHF, COPD. Tx with solu-medrol, nebulizers. Discharged with Symbicort, prednisone. Referred to HF clinic as well.  Objective:  Lab Results  Component Value Date   CREATININE 0.65 06/08/2022   BUN 9 06/08/2022   GFR 87.73 06/07/2021   GFRNONAA >60 06/08/2022   GFRAA >90 09/17/2012   NA 136 06/08/2022   K 3.4 (L) 06/08/2022   CALCIUM 9.3 06/08/2022   CO2 25 06/08/2022   GLUCOSE 152 (H) 06/08/2022    Lab Results  Component Value Date/Time   HGBA1C 6.1 06/07/2021 03:00 PM   HGBA1C 6.1 05/14/2020 11:28 AM   GFR 87.73 06/07/2021 03:00 PM   GFR 88.72 05/14/2020 11:28 AM    Last diabetic Eye exam:  Lab Results  Component Value Date/Time   HMDIABEYEEXA normal 06/12/2007 12:00 AM    Last diabetic Foot exam:  Lab Results  Component Value Date/Time   HMDIABFOOTEX yes 04/16/2008 12:00 AM     Lab Results  Component Value Date   CHOL 118 06/07/2021   HDL 41.30 06/07/2021   LDLCALC 37 06/07/2021   LDLDIRECT 73.0 08/21/2014   TRIG 197.0 (H) 06/07/2021   CHOLHDL 3 06/07/2021  Latest Ref Rng & Units 06/07/2021    3:00 PM 05/14/2020   11:28 AM 01/08/2019   10:38 AM  Hepatic Function  Total Protein 6.0 - 8.3 g/dL 7.4  6.8  7.2   Albumin 3.5 - 5.2 g/dL 4.7  4.1  4.4   AST 0 - 37 U/L 18  14  15    ALT 0 - 35 U/L 17  16  17    Alk Phosphatase 39 - 117 U/L 75  53  60   Total Bilirubin 0.2 - 1.2 mg/dL 0.6  0.5  0.5     Lab Results  Component Value Date/Time   TSH 2.14 06/07/2021 03:00 PM   TSH 1.36  05/14/2020 11:28 AM   FREET4 0.79 11/10/2013 02:35 PM   FREET4 0.7 07/16/2007 09:41 AM       Latest Ref Rng & Units 05/26/2022   12:04 PM 02/21/2022   11:28 AM 02/13/2022    5:47 AM  CBC  WBC 3.8 - 10.8 Thousand/uL 7.3  17.5  12.6   Hemoglobin 11.7 - 15.5 g/dL 57.8  46.9  62.9   Hematocrit 35.0 - 45.0 % 36.9  45.2  36.7   Platelets 140 - 400 Thousand/uL 129  150  144     No results found for: "VD25OH", "VITAMINB12"  Clinical ASCVD: No  The ASCVD Risk score (Arnett DK, et al., 2019) failed to calculate for the following reasons:   The valid total cholesterol range is 130 to 320 mg/dL    BMW4XL2/GMW Stroke Risk Points  Current as of 6 hours ago     4 >= 2 Points: High Risk  1 - 1.99 Points: Medium Risk  0 Points: Low Risk    Last Change:       Points Metrics  1 Has Congestive Heart Failure:  Yes    Current as of 6 hours ago  0 Has Vascular Disease:  No    Current as of 6 hours ago  1 Has Hypertension:  Yes    Current as of 6 hours ago  1 Age:  20    Current as of 6 hours ago  0 Has Diabetes:  No    Current as of 6 hours ago  0 Had Stroke:  No  Had TIA:  No  Had Thromboembolism:  No    Current as of 6 hours ago  1 Female:  Yes    Current as of 6 hours ago       05/18/2022    1:25 PM 05/17/2022   11:52 AM 05/16/2021    1:19 PM  Depression screen PHQ 2/9  Decreased Interest 0 2 0  Down, Depressed, Hopeless 0 2 1  PHQ - 2 Score 0 4 1  Altered sleeping 0 3   Tired, decreased energy 0 3   Change in appetite 0 3   Feeling bad or failure about yourself  0 3   Trouble concentrating 0 3   Moving slowly or fidgety/restless 0 0   Suicidal thoughts 0 0   PHQ-9 Score 0 19   Difficult doing work/chores Not difficult at all Very difficult      Social History   Tobacco Use  Smoking Status Former   Packs/day: 1   Types: E-cigarettes, Cigarettes   Quit date: 2011   Years since quitting: 13.2  Smokeless Tobacco Never  Tobacco Comments   She is vaping, former smoker      BP Readings from Last 3 Encounters:  06/21/22 126/70  06/08/22 128/60  06/01/22 125/64   Pulse Readings from Last 3 Encounters:  06/21/22 73  06/08/22 60  06/01/22 64   Wt Readings from Last 3 Encounters:  06/21/22 201 lb 8 oz (91.4 kg)  06/08/22 200 lb (90.7 kg)  06/01/22 209 lb (94.8 kg)   BMI Readings from Last 3 Encounters:  06/21/22 35.69 kg/m  06/08/22 35.43 kg/m  06/01/22 37.02 kg/m    Allergies  Allergen Reactions   Abilify [Aripiprazole]     Vision problem   Ambien [Zolpidem]     Hallucinations Ms change    Codeine Nausea And Vomiting    REACTION: Nausea and vomiting Pt has tolerated vicodin & percocet in the past   Cymbalta [Duloxetine Hcl]     More depressed/tearful    Lipitor [Atorvastatin]     Muscle pain    Vesicare [Solifenacin]     MS change   Wellbutrin [Bupropion]     Medications Reviewed Today     Reviewed by Shon Millet, CMA (Certified Medical Assistant) on 06/21/22 at 1201  Med List Status: <None>   Medication Order Taking? Sig Documenting Provider Last Dose Status Informant  acetaminophen (TYLENOL) 500 MG tablet [id number] Yes Take 500 mg by mouth every 6 (six) hours as needed. [provider] Taking Active   albuterol (VENTOLIN HFA) 108 (90 Base) MCG/ACT inhaler [id number] Yes Inhale 2 puffs into the lungs every 6 (six) hours as needed for wheezing or shortness of breath. Alford Highland, MD Taking Active   apixaban (ELIQUIS) 5 MG TABS tablet [id number] Yes TAKE 1 TABLET BY MOUTH TWICE A DAY Gollan, Tollie Pizza, MD Taking Active   busPIRone (BUSPAR) 30 MG tablet [id number] Yes TAKE ONE HALF TO 1 TABLET BY MOUTH 3 TIMES DAILY AS DIRECTED Tower, Audrie Gallus, MD Taking Active Self  cyclobenzaprine (FLEXERIL) 10 MG tablet [id number] Yes Take 10 mg by mouth 3 (three) times daily as needed for muscle spasms. 0.5-1 tablets [provider] Taking Active            Med Note [facility], RAQUEL   Thu Jun 01, 2022  9:10 AM)     diltiazem (CARDIZEM CD) 120 MG 24 hr capsule [id number] Yes Take 1 capsule (120 mg total) by mouth daily. Sondra Barges, PA-C Taking Active   DULoxetine (CYMBALTA) 60 MG capsule [id number] Yes Take 60 mg by mouth daily. [provider] Taking Active   flecainide (TAMBOCOR) 100 MG tablet [id number] Yes Take 1 tablet (100 mg total) by mouth 2 (two) times daily. Antonieta Iba, MD Taking Active Self  Fluticasone-Umeclidin-Vilant (TRELEGY ELLIPTA) 100-62.5-25 MCG/ACT AEPB [id number] Yes Inhale 1 puff into the lungs daily. Salena Saner, MD Taking Active   Fluticasone-Umeclidin-Vilant [facility] ELLIPTA) 100-62.5-25 MCG/ACT AEPB [id number] Yes Inhale 1 puff into the lungs daily. Salena Saner, MD Taking Active   furosemide (LASIX) 20 MG tablet [id number]  Take 1 tablet (20 mg total) by mouth daily. Sondra Barges, PA-C  Expired 06/08/22 2359   hydrOXYzine (VISTARIL) 25 MG capsule [id number] Yes Take 25 mg by mouth 2 (two) times daily. Doles-Johnson, Teah, NP Taking Active            Med Note [facility], [place]   Thu Jun 01, 2022  9:12 AM)    metoprolol succinate (TOPROL-XL) 50 MG 24 hr tablet [id number] Yes TAKE 1 TABLET BY MOUTH TWICE A DAY WITH MEALS Gollan, Tollie Pizza, MD Taking Active   mirabegron ER (MYRBETRIQ) 25 MG TB24 tablet [id number] Yes Take  1 tablet (25 mg total) by mouth daily. Tower, Audrie Gallus, MD Taking Active   PARoxetine (PAXIL) 10 MG tablet [id number] Yes Take 1 tablet by mouth daily. Doles-Johnson, Teah, NP Taking Active   potassium chloride (KLOR-CON) 10 MEQ tablet [id number] Yes Take 3 tablets (30 mEq total) by mouth daily. Delma Freeze, FNP Taking Active   predniSONE (DELTASONE) 10 MG tablet [id number] Yes Take 4 pills once daily by mouth for 3 days, then 3 pills daily for 3 days, then 2 pills daily for 3 days then 1 pill daily for 3 days then stop Tower, Audrie Gallus, MD Taking Active   rosuvastatin (CRESTOR) 10 MG tablet [id number] Yes TAKE 1 TABLET BY MOUTH ONCE A DAY  Tower, Audrie Gallus, MD Taking Active   traMADol (ULTRAM) 50 MG tablet [id number] Yes TAKE 1-2 TABLETS BY MOUTH EVERY 8 HOURS AS NEEDED FOR MODERATE PAIN Tower, Audrie Gallus, MD Taking Active            Med Note [facility], RAQU[place]   Thu Jun 01, 2022  9:13 AM)              SDOH:  (Social Determinants of Health) assessments and interventions performed: No SDOH Interventions    Flowsheet Row Clinical Support from 05/18/2022 in [facility] [place] Office Visit from 05/17/2022 in [facility] Clinical Support from 05/14/2020 in [facility] [place] Office Visit from 10/17/2019 in [facility] [place] [facility] Clinical Support from 12/15/2016 in [facility] [place]  SDOH Interventions       Food Insecurity Interventions Intervention Not Indicated -- -- -- --  Housing Interventions Intervention Not Indicated -- -- -- --  Transportation Interventions Intervention Not Indicated -- -- -- --  Utilities Interventions Intervention Not Indicated -- -- -- --  Alcohol Usage Interventions Intervention Not Indicated (Score <7) -- -- -- --  Depression Interventions/Treatment  -- Currently on Treatment, Counseling  [sees psychiatrist] Medication, Currently on Treatment Medication, Currently on Treatment --  [referral to PCP]  Financial Strain Interventions Intervention Not Indicated -- -- -- --  Physical Activity Interventions Patient Refused, Other (Comments) -- -- -- --  Stress Interventions Intervention Not Indicated -- -- -- --  Social Connections Interventions Intervention Not Indicated -- -- -- --       Medication Assistance: None required.  Patient affirms current coverage meets needs.  Medication Access: Within the past 30 days, how often has patient missed a dose of medication? 0 Is a pillbox or other method used to improve adherence? Yes  Factors that may affect medication  adherence? Memory impairment Are meds synced by current pharmacy? No  Are meds delivered by current pharmacy? No  Does patient experience delays in picking up medications due to transportation concerns? No   [facility] Services Reviewed: Is patient disadvantaged to use [facility]?: Yes  Current Rx insurance plan: Express scripts Name and location of Current pharmacy:  [facility] - [place], [place] - [address] [place] [address] Phone: [phone] Fax: [phone]  [facility] services reviewed with patient today?: No  Patient requests to transfer care to [facility] Pharmacy?: No  Reason patient declined to change pharmacies: Disadvantaged due to insurance/mail order  Compliance/Adherence/Medication fill history: Care Gaps: Colonoscopy (due 11/2020)  Star-Rating Drugs: Rosuvastatin - PDC 94%   ASSESSMENT / PLAN  Atrial Fibrillation (Goal: prevent stroke and major bleeding) -Controlled - maintaining  NSR in recent cardiology OV -CHADSVASC: 4; previously set up 2 cardioversions but cancelled d/t spontaneous conversion to NSR -Current treatment: Metoprolol succinate 50 mg daily - Appropriate, Effective, Safe, Accessible Diltiazem CD 120 mg daily -Appropriate, Effective, Safe, Accessible Flecainide 100 mg BID -Appropriate, Effective, Safe, Accessible Eliquis 5 mg BID -Appropriate, Effective, Safe, Accessible -Medications previously tried: n/a -Counseled on increased risk of stroke due to Afib and benefits of anticoagulation for stroke prevention; bleeding risk associated with Eliquis and importance of self-monitoring for signs/symptoms of bleeding; -Recommended to continue current medication  Heart Failure (Goal: manage symptoms and prevent exacerbations) -Controlled -Last ejection fraction: 50-55% (Date: 02/14/22) -HF type: HFpEF (EF > 50%) -NYHA Class: II (slight limitation of activity) -Current treatment: Furosemide 20 mg  daily -Appropriate, Effective, Safe, Accessible Metoprolol succinate 50 mg daily -Appropriate, Effective, Safe, Accessible Potassium 20 meq daily -Appropriate, Effective, Safe, Accessible -Medications previously tried: n/a  -Current home daily weights: not checking -Educated on Benefits of medications for managing symptoms and prolonging life Importance of weighing daily - if you gain more than 3 pounds in one day or 5 pounds in one week, call cardiology; Proper diuretic administration and potassium supplementation -Recommended to continue current medication  COPD / asthma (Goal: control symptoms and prevent exacerbations) -Controlled - pt reports improvement in sx since starting Trelegy -Gold Grade: Gold 1 (FEV1>80%) -Current COPD Classification:  E (exacerbation leading to hospitalization OR 2+ moderate exacerbations) -MMRC/CAT score: not on file -Pulmonary function testing: 02/2022 - FEV1 80% pred; FEV1/FVC 0.89; FEV1 11% change post-bronchodilator. Dx: minimal obstructive airway disease, reactive airways, minimal restriction (interstitial - possible fibrosis or inflammation) -Exacerbations requiring treatment in last 6 months: 1 -Current treatment  Trelegy 100-62.5-25 mcg/act 1 puff daily - Appropriate, Effective, Safe, Accessible Albuterol HFA prn -Appropriate, Effective, Safe, Accessible -Medications previously tried: symbicort  -Patient reports consistent use of maintenance inhaler -Frequency of rescue inhaler use: 2-3x weekly -Counseled on Proper inhaler technique; Benefits of consistent maintenance inhaler use; Differences between maintenance and rescue inhalers -Recommended to continue current medication  Depression/Anxiety (Goal: manage symptoms) -Follows with psychiatry, they are adjusting medications currently - pt was in process of weaning off paroxetine but felt significantly worse off med so it was restarted at 10 mg; pt also reports she takes buspirone PRN but typically  twice a day, she also takes hydroxyzine scheduled twice a day -Pt also asked about xanax for PRN use - discussed overall risks of chronic benzodiazepine use, would not recommend starting in her case -PHQ9: 0 (05/2022) - minimal depression -GAD7: not on file -Connected with Psychiatry (NP Teah Doles-Johnson) for mental health support -Current treatment: Buspirone 30 mg BID - Appropriate, Effective, Safe, Accessible Duloxetine 60 mg daily -Appropriate, Effective, Safe, Accessible Paroxetine 10 mg daily -Appropriate, Effective, Safe, Accessible Hydroxyzine 25 mg PRN - Appropriate, Effective, Safe, Accessible -Medications previously tried/failed: abilify (vision), bupropion, zolpidem, sertraline, mirtazapine -Recommend to continue current medication  Insomnia (Goal: improve sleep) -Not ideally controlled - pt was previously on zolpidem but this was recently stopped due to hallucinations, pt/family reports hallucinations are gone, she is sleeping ok in the meantime -Current treatment  None -Medications previously tried: zolpidem  -Reviewed importance of staying off zolpidem, avoiding sedative hypnotics in the future -Advised she can use melatonin 3-5 mg at bedtime in meantime  Overactive bladder (Goal: manage symptoms) -Controlled -  -Current treatment  Myrbetriq 25 mg daily - Appropriate, Effective, Safe, Accessible -Medications previously tried: solifenacin -Recommend to continue current medication  Chronic back pain (Goal: manage  pain) -Not ideally controlled - pt is undergoing MRI today to evaluate worsening back pain -Hx degenerative disc disease -Current treatment  Tramadol 50 mg PRN - Appropriate, Effective, Safe, Accessible Cyclobenzaprine 10 mg PRN - Appropriate, Effective, Safe, Accessible Tylenol 500 mg PRN - Appropriate, Effective, Safe, Accessible -Medications previously tried: n/a  -Recommend to continue current medication  Hyperlipidemia: (LDL goal < 70) -Controlled -  LDL 37 (05/2021) -Current treatment: Rosuvastatin 10 mg daily - Appropriate, Effective, Safe, Accessible -Medications previously tried: n/a  -Educated on Cholesterol goals; Benefits of statin for ASCVD risk reduction; -Recommended to continue current medication  Health Maintenance -Vaccine gaps: TDAP  Al CorpusLindsey Vedanth Sirico, PharmD, BCACP Clinical Pharmacist [facility] [phone]

## 2022-06-23 ENCOUNTER — Ambulatory Visit (INDEPENDENT_AMBULATORY_CARE_PROVIDER_SITE_OTHER): Payer: Medicare Other | Admitting: Adult Health

## 2022-06-23 ENCOUNTER — Other Ambulatory Visit: Payer: Self-pay | Admitting: Internal Medicine

## 2022-06-23 ENCOUNTER — Encounter: Payer: Self-pay | Admitting: Adult Health

## 2022-06-23 VITALS — BP 122/78 | HR 62 | Temp 97.3°F | Ht 63.0 in | Wt 204.4 lb

## 2022-06-23 DIAGNOSIS — Z87891 Personal history of nicotine dependence: Secondary | ICD-10-CM

## 2022-06-23 DIAGNOSIS — J449 Chronic obstructive pulmonary disease, unspecified: Secondary | ICD-10-CM

## 2022-06-23 DIAGNOSIS — Z720 Tobacco use: Secondary | ICD-10-CM | POA: Insufficient documentation

## 2022-06-23 DIAGNOSIS — E042 Nontoxic multinodular goiter: Secondary | ICD-10-CM

## 2022-06-23 NOTE — Patient Instructions (Addendum)
Continue on Trelegy 1 puff daily, rinse after use Albuterol as needed. Activity as tolerated Work on not Tree surgeon to Lung cancer CT chest screening program.  Follow-up with Dr. Jayme Cloud in 6 months and as needed

## 2022-06-23 NOTE — Assessment & Plan Note (Signed)
COPD with improved symptom control on Trelegy.  Patient is encouraged on vaping cessation.  Activity as tolerated.  Refer to the lung cancer CT screening program.  Plan  Patient Instructions  Continue on Trelegy 1 puff daily, rinse after use Albuterol as needed. Activity as tolerated Work on not Tree surgeon to Lung cancer CT chest screening program.  Follow-up with Dr. Jayme Cloud in 6 months and as needed

## 2022-06-23 NOTE — Assessment & Plan Note (Signed)
History of smoking, and ongoing vaping use.  Cessation discussed in detail.  Will refer to the lung cancer CT screening program.

## 2022-06-23 NOTE — Progress Notes (Signed)
Agree with the details of the visit as noted by Tammy Parrett, NP.  C. Laura Jameya Pontiff, MD [facility] 

## 2022-06-23 NOTE — Progress Notes (Signed)
  ID: Anita Harrell, female    DOB: 06/12/1950, 72 y.o.   MRN: [id number]  Chief Complaint  Patient presents with   Follow-up    Referring provider: Judy Pimple, MD  HPI: 72 year old female active smoker (vaping) followed for COPD with asthma Medical history significant for A-fib on Eliquis  TEST/EVENTS :  CT chest Jul 11, 2021 showed stable right upper lobe groundglass attenuation over the last 3 years.,  Emphysema, stable left apical scarring  PFTs February 23, 2022 showed moderate restriction with FEV1 at 80%, ratio 89, FVC 67%, mild positive bronchodilator response, DLCO 72%.  06/23/2022 Follow up : COPD with asthma  Patient presents for a 30-month follow-up.  Patient has underlying COPD with asthma.  She continues to vape.  We discussed cessation in detail. She is trying to cut back,.  Last visit she was changed from Symbicort to Trelegy inhaler.  Says that it is working better.  Feels that her breathing has improved.  She has decreased cough and congestion. Former smoker 68 to age 52 1 PPD.  Patient says she is not very active -Limited due to back pain .  We discussed referral to the lung cancer screening program which she qualifies for.  Patient denies any chest pain orthopnea PND or hemoptysis.  Allergies  Allergen Reactions   Abilify [Aripiprazole]     Vision problem   Ambien [Zolpidem]     Hallucinations Ms change    Codeine Nausea And Vomiting    REACTION: Nausea and vomiting Pt has tolerated vicodin & percocet in the past   Cymbalta [Duloxetine Hcl]     More depressed/tearful    Lipitor [Atorvastatin]     Muscle pain    Vesicare [Solifenacin]     MS change   Wellbutrin [Bupropion]     Immunization History  Administered Date(s) Administered   Fluad Quad(high Dose 65+) 01/16/2019, 11/29/2021   Influenza Split 03/27/2011, 01/23/2012   Influenza,inj,Quad PF,6+ Mos 11/26/2012, 11/10/2013, 11/24/2016, 12/25/2017   PFIZER(Purple Top)SARS-COV-2  Vaccination 10/20/2019, 11/10/2019   Pneumococcal Conjugate-13 08/21/2014   Pneumococcal Polysaccharide-23 05/19/2008, 08/30/2015   Td 03/13/2001   Tdap 01/23/2012   Zoster Recombinat (Shingrix) 05/04/2021, 06/21/2021   Zoster, Live 01/07/2013    Past Medical History:  Diagnosis Date   Allergy    allergic rhinitis   Anxiety    Arrhythmia    atrial fibrillation   Back pain    Chest pain    CHF (congestive heart failure)    COPD (chronic obstructive pulmonary disease)    Depression    Epigastric pain    Goiter    History of tobacco abuse    Hyperglycemia    mild   Hyperlipidemia    Hypertension    Insomnia    Hx of   Labile blood pressure    Panic disorder    History of   Personal history of colonic polyps 06/09/2004   SVD (spontaneous vaginal delivery)    x 2    Tobacco History: Social History   Tobacco Use  Smoking Status Former   Packs/day: 1   Types: E-cigarettes, Cigarettes   Quit date: 2011   Years since quitting: 13.2  Smokeless Tobacco Never  Tobacco Comments   She is vaping, former smoker     Counseling given: Not Answered Tobacco comments: She is vaping, former smoker     Outpatient Medications Prior to Visit  Medication Sig Dispense Refill   acetaminophen (TYLENOL) 500 MG tablet Take 500 mg by  mouth every 6 (six) hours as needed.     albuterol (VENTOLIN HFA) 108 (90 Base) MCG/ACT inhaler Inhale 2 puffs into the lungs every 6 (six) hours as needed for wheezing or shortness of breath. 8 g 2   apixaban (ELIQUIS) 5 MG TABS tablet TAKE 1 TABLET BY MOUTH TWICE A DAY 60 tablet 5   busPIRone (BUSPAR) 30 MG tablet TAKE ONE HALF TO 1 TABLET BY MOUTH 3 TIMES DAILY AS DIRECTED 270 tablet 1   cyclobenzaprine (FLEXERIL) 10 MG tablet Take 10 mg by mouth 3 (three) times daily as needed for muscle spasms. 0.5-1 tablets     diltiazem (CARDIZEM CD) 120 MG 24 hr capsule Take 1 capsule (120 mg total) by mouth daily. 90 capsule 3   DULoxetine (CYMBALTA) 60 MG capsule  Take 60 mg by mouth daily.     flecainide (TAMBOCOR) 100 MG tablet Take 1 tablet (100 mg total) by mouth 2 (two) times daily. 180 tablet 3   Fluticasone-Umeclidin-Vilant (TRELEGY ELLIPTA) 100-62.5-25 MCG/ACT AEPB Inhale 1 puff into the lungs daily. 28 each 11   hydrOXYzine (VISTARIL) 25 MG capsule Take 25 mg by mouth 2 (two) times daily.     metoprolol succinate (TOPROL-XL) 50 MG 24 hr tablet TAKE 1 TABLET BY MOUTH TWICE A DAY WITH MEALS 180 tablet 1   mirabegron ER (MYRBETRIQ) 25 MG TB24 tablet Take 1 tablet (25 mg total) by mouth daily. 90 tablet 3   PARoxetine (PAXIL) 10 MG tablet Take 1 tablet by mouth daily.     potassium chloride (KLOR-CON) 10 MEQ tablet Take 3 tablets (30 mEq total) by mouth daily. 270 tablet 3   predniSONE (DELTASONE) 10 MG tablet Take 4 pills once daily by mouth for 3 days, then 3 pills daily for 3 days, then 2 pills daily for 3 days then 1 pill daily for 3 days then stop 30 tablet 0   rosuvastatin (CRESTOR) 10 MG tablet TAKE 1 TABLET BY MOUTH ONCE A DAY 90 tablet 3   traMADol (ULTRAM) 50 MG tablet TAKE 1-2 TABLETS BY MOUTH EVERY 8 HOURS AS NEEDED FOR MODERATE PAIN 90 tablet 0   Fluticasone-Umeclidin-Vilant (TRELEGY ELLIPTA) 100-62.5-25 MCG/ACT AEPB Inhale 1 puff into the lungs daily. (Patient not taking: Reported on 06/23/2022) 14 each 0   furosemide (LASIX) 20 MG tablet Take 1 tablet (20 mg total) by mouth daily. 90 tablet 3   No facility-administered medications prior to visit.     Review of Systems:   Constitutional:   No  weight loss, night sweats,  Fevers, chills, fatigue, or  lassitude.  HEENT:   No headaches,  Difficulty swallowing,  Tooth/dental problems, or  Sore throat,                No sneezing, itching, ear ache, nasal congestion, post nasal drip,   CV:  No chest pain,  Orthopnea, PND, swelling in lower extremities, anasarca, dizziness, palpitations, syncope.   GI  No heartburn, indigestion, abdominal pain, nausea, vomiting, diarrhea, change in bowel  habits, loss of appetite, bloody stools.   Resp No excess mucus, no productive cough,  No non-productive cough,  No coughing up of blood.  No change in color of mucus.  No wheezing.  No chest wall deformity  Skin: no rash or lesions.  GU: no dysuria, change in color of urine, no urgency or frequency.  No flank pain, no hematuria   MS:  No joint pain or swelling.  No decreased range of motion.  No back  pain.    Physical Exam  BP 122/78 (BP Location: Left Arm, Cuff Size: Large)   Pulse 62   Temp (!) 97.3 F (36.3 C)   Ht 5\' 3"  (1.6 m)   Wt 204 lb 6.4 oz (92.7 kg)   SpO2 95%   BMI 36.21 kg/m   GEN: A/Ox3; pleasant , NAD, well nourished    HEENT:  [place]/AT, NOSE-clear, THROAT-clear, no lesions, no postnasal drip or exudate noted.   NECK:  Supple w/ fair ROM; no JVD; normal carotid impulses w/o bruits; no thyromegaly or nodules palpated; no lymphadenopathy.    RESP  Clear  P & A; w/o, wheezes/ rales/ or rhonchi. no accessory muscle use, no dullness to percussion  CARD:  RRR, no m/r/g, no peripheral edema, pulses intact, no cyanosis or clubbing.  GI:   Soft & nt; nml bowel sounds; no organomegaly or masses detected.   Musco: Warm bil, no deformities or joint swelling noted.   Neuro: alert, no focal deficits noted.    Skin: Warm, no lesions or rashes    Lab Results:  CBC   BNP  ProBNP No results found for: "PROBNP"  Imaging:        Latest Ref Rng & Units 02/23/2022    1:54 PM  PFT Results  FVC-Pre L 1.99   FVC-Predicted Pre % 65   FVC-Post L 2.06   FVC-Predicted Post % 67   Pre FEV1/FVC % % 83   Post FEV1/FCV % % 89   FEV1-Pre L 1.66   FEV1-Predicted Pre % 71   FEV1-Post L 1.84   DLCO uncorrected ml/min/mmHg 14.47   DLCO UNC% % 72   DLVA Predicted % 84   TLC L 4.09   TLC % Predicted % 78   RV % Predicted % 77     No results found for: "NITRICOXIDE"      Assessment & Plan:   COPD (chronic obstructive pulmonary disease) COPD with improved  symptom control on Trelegy.  Patient is encouraged on vaping cessation.  Activity as tolerated.  Refer to the lung cancer CT screening program.  Plan  Patient Instructions  Continue on Trelegy 1 puff daily, rinse after use Albuterol as needed. Activity as tolerated Work on not Tree surgeon to Lung cancer CT chest screening program.  Follow-up with Dr. Jayme Cloud in 6 months and as needed    Tobacco abuse History of smoking, and ongoing vaping use.  Cessation discussed in detail.  Will refer to the lung cancer CT screening program.    Rubye Oaks, NP 06/23/2022

## 2022-06-25 NOTE — Addendum Note (Signed)
Addended by: Roxy Manns A on: 06/25/2022 04:17 PM   Modules accepted: Orders

## 2022-06-26 ENCOUNTER — Encounter: Payer: Self-pay | Admitting: *Deleted

## 2022-06-28 DIAGNOSIS — M545 Low back pain, unspecified: Secondary | ICD-10-CM | POA: Diagnosis not present

## 2022-06-28 DIAGNOSIS — M4316 Spondylolisthesis, lumbar region: Secondary | ICD-10-CM | POA: Diagnosis not present

## 2022-06-28 DIAGNOSIS — M47896 Other spondylosis, lumbar region: Secondary | ICD-10-CM | POA: Diagnosis not present

## 2022-06-29 DIAGNOSIS — F5101 Primary insomnia: Secondary | ICD-10-CM | POA: Diagnosis not present

## 2022-06-29 DIAGNOSIS — F332 Major depressive disorder, recurrent severe without psychotic features: Secondary | ICD-10-CM | POA: Diagnosis not present

## 2022-06-29 DIAGNOSIS — F411 Generalized anxiety disorder: Secondary | ICD-10-CM | POA: Diagnosis not present

## 2022-07-03 ENCOUNTER — Other Ambulatory Visit: Payer: Self-pay | Admitting: Family Medicine

## 2022-07-03 NOTE — Telephone Encounter (Signed)
Name of Medication: Tramadol Name of Pharmacy: [facility] Last Fill or Written Date and Quantity: 06/21/22 #90 tabs/ 0 refills Last Office Visit and Type: Back Pain on 4/10/824 Next Office Visit and Type: none scheduled

## 2022-07-11 ENCOUNTER — Encounter: Payer: Self-pay | Admitting: Diagnostic Neuroimaging

## 2022-07-11 ENCOUNTER — Other Ambulatory Visit: Payer: Self-pay | Admitting: Family Medicine

## 2022-07-11 ENCOUNTER — Ambulatory Visit (INDEPENDENT_AMBULATORY_CARE_PROVIDER_SITE_OTHER): Payer: Medicare Other | Admitting: Diagnostic Neuroimaging

## 2022-07-11 VITALS — BP 120/72 | HR 57 | Ht 63.0 in | Wt 206.6 lb

## 2022-07-11 DIAGNOSIS — R441 Visual hallucinations: Secondary | ICD-10-CM | POA: Diagnosis not present

## 2022-07-11 NOTE — Telephone Encounter (Signed)
Name of Medication: Tramadol Name of Pharmacy: [facility] Last Fill or Written Date and Quantity: 07/03/22 #90 tabs/ 0 refills Last Office Visit and Type: Back Pain on 4/10/824 Next Office Visit and Type: none scheduled

## 2022-07-11 NOTE — Progress Notes (Signed)
[facility]  PATIENT: Anita Harrell DOB: [birth year]  REFERRING CLINICIAN: Tower, Audrie Gallus, MD HISTORY FROM: patient  REASON FOR VISIT: new consult   HISTORICAL  CHIEF COMPLAINT:  Chief Complaint  Patient presents with   New Patient (Initial Visit)    Patient in room #7 with her husband.  Patient states she been having hallucination lately and she blames. Patient states she can't sleep at night.    HISTORY OF PRESENT ILLNESS:   72 year old female here for evaluation of hallucinations.  For past 1 year patient had formed visual hallucinations, typically in her bedroom at nighttime and low light situations.  She sees people who are nonthreatening.  Sometimes talks to them.  She would ask her husband if he can see them but then he would tell her to turn the lights on and the images would go away.  In March 2024 patient woke up and did not see her husband.  She went outside and thought he was out there.  She thought she was in some danger and she called the police.  She was very paranoid and would not believe what was happening.  Mention she took antianxiety medications and symptoms resolved.  The next day she decided to stop her Ambien which she had been taking for over 20 years.  Since that time hallucinations have resolved.   REVIEW OF SYSTEMS: Full 14 system review of systems performed and negative with exception of: as per HPI.  ALLERGIES: Allergies  Allergen Reactions   Abilify [Aripiprazole]     Vision problem   Ambien [Zolpidem]     Hallucinations Ms change    Codeine Nausea And Vomiting    REACTION: Nausea and vomiting Pt has tolerated vicodin & percocet in the past   Cymbalta [Duloxetine Hcl]     More depressed/tearful    Lipitor [Atorvastatin]     Muscle pain    Vesicare [Solifenacin]     MS change   Wellbutrin [Bupropion]     HOME MEDICATIONS: Outpatient Medications Prior to Visit  Medication Sig Dispense Refill   acetaminophen  (TYLENOL) 500 MG tablet Take 500 mg by mouth every 6 (six) hours as needed.     albuterol (VENTOLIN HFA) 108 (90 Base) MCG/ACT inhaler Inhale 2 puffs into the lungs every 6 (six) hours as needed for wheezing or shortness of breath. 8 g 2   apixaban (ELIQUIS) 5 MG TABS tablet TAKE 1 TABLET BY MOUTH TWICE A DAY 60 tablet 5   busPIRone (BUSPAR) 30 MG tablet TAKE ONE HALF TO 1 TABLET BY MOUTH 3 TIMES DAILY AS DIRECTED 270 tablet 1   cyclobenzaprine (FLEXERIL) 10 MG tablet Take 10 mg by mouth 3 (three) times daily as needed for muscle spasms. 0.5-1 tablets     diltiazem (CARDIZEM CD) 120 MG 24 hr capsule Take 1 capsule (120 mg total) by mouth daily. 90 capsule 3   DULoxetine (CYMBALTA) 60 MG capsule Take 60 mg by mouth daily.     flecainide (TAMBOCOR) 100 MG tablet Take 1 tablet (100 mg total) by mouth 2 (two) times daily. 180 tablet 3   Fluticasone-Umeclidin-Vilant (TRELEGY ELLIPTA) 100-62.5-25 MCG/ACT AEPB Inhale 1 puff into the lungs daily. 28 each 11   hydrOXYzine (VISTARIL) 25 MG capsule Take 25 mg by mouth 2 (two) times daily.     Melatonin 5 MG CAPS Take by mouth.     metoprolol succinate (TOPROL-XL) 50 MG 24 hr tablet TAKE 1 TABLET BY MOUTH TWICE A DAY WITH  MEALS 180 tablet 1   mirabegron ER (MYRBETRIQ) 25 MG TB24 tablet Take 1 tablet (25 mg total) by mouth daily. 90 tablet 3   PARoxetine (PAXIL) 10 MG tablet Take 1 tablet by mouth daily.     potassium chloride (KLOR-CON) 10 MEQ tablet Take 3 tablets (30 mEq total) by mouth daily. 270 tablet 3   predniSONE (DELTASONE) 10 MG tablet Take 4 pills once daily by mouth for 3 days, then 3 pills daily for 3 days, then 2 pills daily for 3 days then 1 pill daily for 3 days then stop 30 tablet 0   rosuvastatin (CRESTOR) 10 MG tablet TAKE 1 TABLET BY MOUTH ONCE A DAY 90 tablet 3   traMADol (ULTRAM) 50 MG tablet TAKE ONE (1) TO TWO (2) TABLETS BY MOUTH EVERY EIGHT HOURS AS NEEDED FOR MODERATE PAIN 60 tablet 0   traZODone (DESYREL) 50 MG tablet Take 50 mg by  mouth at bedtime as needed.     Fluticasone-Umeclidin-Vilant (TRELEGY ELLIPTA) 100-62.5-25 MCG/ACT AEPB Inhale 1 puff into the lungs daily. (Patient not taking: Reported on 06/23/2022) 14 each 0   furosemide (LASIX) 20 MG tablet Take 1 tablet (20 mg total) by mouth daily. 90 tablet 3   No facility-administered medications prior to visit.    PAST MEDICAL HISTORY: Past Medical History:  Diagnosis Date   Allergy    allergic rhinitis   Anxiety    Arrhythmia    atrial fibrillation   Back pain    Chest pain    CHF (congestive heart failure) (HCC)    COPD (chronic obstructive pulmonary disease) (HCC)    Depression    Epigastric pain    Goiter    History of tobacco abuse    Hyperglycemia    mild   Hyperlipidemia    Hypertension    Insomnia    Hx of   Labile blood pressure    Panic disorder    History of   Personal history of colonic polyps 06/09/2004   SVD (spontaneous vaginal delivery)    x 2    PAST SURGICAL HISTORY: Past Surgical History:  Procedure Laterality Date   ABDOMINAL HYSTERECTOMY N/A 09/24/2012   Procedure: HYSTERECTOMY ABDOMINAL;  Surgeon: Miguel Aschoff, MD;  Location: [facility];  Service: Gynecology;  Laterality: N/A;   BREAST BIOPSY     CARDIOVERSION N/A 02/09/2022   Procedure: CARDIOVERSION;  Surgeon: Antonieta Iba, MD;  Location: [facility];  Service: Cardiovascular;  Laterality: N/A;   CARDIOVERSION N/A 03/02/2022   Procedure: CARDIOVERSION;  Surgeon: Antonieta Iba, MD;  Location: [facility];  Service: Cardiovascular;  Laterality: N/A;   COLONOSCOPY     COLONOSCOPY  2017   ELBOW SURGERY  2004   EYE SURGERY     bilateral lasik   SALPINGOOPHORECTOMY Bilateral 09/24/2012   Procedure: SALPINGO OOPHORECTOMY;  Surgeon: Miguel Aschoff, MD;  Location: [facility];  Service: Gynecology;  Laterality: Bilateral;   THYROID SURGERY     B9 massess   WART FULGURATION N/A 09/24/2012   Procedure: FULGURATION VAGINAL WART;  Surgeon: Miguel Aschoff, MD;  Location: [facility];  Service:  Gynecology;  Laterality: N/A;   WISDOM TOOTH EXTRACTION      FAMILY HISTORY: Family History  Problem Relation Age of Onset   Hypertension Mother    Stroke Mother    Alcohol abuse Father    Cancer Father        lung CA   Heart disease Father        CHF  Cancer Sister        breast CA   Heart disease Sister        CHF from chemotx also has defib   Breast cancer Sister    Colon cancer Neg Hx    Esophageal cancer Neg Hx    Rectal cancer Neg Hx    Stomach cancer Neg Hx     SOCIAL HISTORY: Social History   Socioeconomic History   Marital status: Married    Spouse name: Not on file   Number of children: Not on file   Years of education: Not on file   Highest education level: Not on file  Occupational History   Not on file  Tobacco Use   Smoking status: Former    Packs/day: 1    Types: E-cigarettes, Cigarettes    Quit date: 2011    Years since quitting: 13.3   Smokeless tobacco: Never   Tobacco comments:    She is vaping, former smoker    Building services engineer Use: Every day  Substance and Sexual Activity   Alcohol use: No    Alcohol/week: 0.0 standard drinks of alcohol   Drug use: No   Sexual activity: Yes    Birth control/protection: Post-menopausal  Other Topics Concern   Not on file  Social History Narrative   Lives with husband and 2 pets; dogs.   Social Determinants of Health   Financial Resource Strain: Low Risk  (05/18/2022)   Overall Financial Resource Strain (CARDIA)    Difficulty of Paying Living Expenses: Not hard at all  Food Insecurity: No Food Insecurity (05/18/2022)   Hunger Vital Sign    Worried About Running Out of Food in the Last Year: Never true    Ran Out of Food in the Last Year: Never true  Transportation Needs: No Transportation Needs (05/18/2022)   PRAPARE - Administrator, Civil Service (Medical): No    Lack of Transportation (Non-Medical): No  Physical Activity: Inactive (05/18/2022)   Exercise Vital Sign    Days of  Exercise per Week: 0 days    Minutes of Exercise per Session: 0 min  Stress: Stress Concern Present (05/18/2022)   [employer] of Occupational Health - Occupational Stress Questionnaire    Feeling of Stress : To some extent  Social Connections: Moderately Integrated (05/18/2022)   Social Connection and Isolation Panel [NHANES]    Frequency of Communication with Friends and Family: More than three times a week    Frequency of Social Gatherings with Friends and Family: More than three times a week    Attends Religious Services: More than 4 times per year    Active Member of [employer] or Organizations: No    Attends Banker Meetings: Never    Marital Status: Married  Catering manager Violence: Not At Risk (05/18/2022)   Humiliation, Afraid, Rape, and Kick questionnaire    Fear of Current or Ex-Partner: No    Emotionally Abused: No    Physically Abused: No    Sexually Abused: No     PHYSICAL EXAM  GENERAL EXAM/CONSTITUTIONAL: Vitals:  Vitals:   07/11/22 1135  BP: 120/72  Pulse: (!) 57  Weight: 206 lb 9.6 oz (93.7 kg)  Height: 5\' 3"  (1.6 m)   Body mass index is 36.6 kg/m. Wt Readings from Last 3 Encounters:  07/11/22 206 lb 9.6 oz (93.7 kg)  06/23/22 204 lb 6.4 oz (92.7 kg)  06/21/22 201 lb 8 oz (91.4 kg)  Patient is in no distress; well developed, nourished and groomed; neck is supple  CARDIOVASCULAR: Examination of carotid arteries is normal; no carotid bruits Regular rate and rhythm, no murmurs Examination of peripheral vascular system by observation and palpation is normal  EYES: Ophthalmoscopic exam of optic discs and posterior segments is normal; no papilledema or hemorrhages No results found.  MUSCULOSKELETAL: Gait, strength, tone, movements noted in Neurologic exam below  NEUROLOGIC: MENTAL STATUS:     05/14/2020    1:22 PM 12/15/2016   10:40 AM 08/30/2015    1:45 PM  MMSE - Mini Mental State Exam  Orientation to time 5 5 5   Orientation to Place  5 5 5   Registration 3 3 3   Attention/ Calculation 5 0 0  Recall 3 3 3   Language- name 2 objects  0 0  Language- repeat 1 1 1   Language- follow 3 step command  3 3  Language- read & follow direction  0 0  Write a sentence  0 0  Copy design  0 0  Total score  20 20   awake, alert, oriented to person, place and time recent and remote memory intact normal attention and concentration language fluent, comprehension intact, naming intact fund of knowledge appropriate  CRANIAL NERVE:  2nd - no papilledema on fundoscopic exam 2nd, 3rd, 4th, 6th - pupils equal and reactive to light, visual fields full to confrontation, extraocular muscles intact, no nystagmus 5th - facial sensation symmetric 7th - facial strength symmetric 8th - hearing intact 9th - palate elevates symmetrically, uvula midline 11th - shoulder shrug symmetric 12th - tongue protrusion midline  MOTOR:  normal bulk and tone, full strength in the BUE, BLE  SENSORY:  normal and symmetric to light touch, temperature, vibration  COORDINATION:  finger-nose-finger, fine finger movements normal  REFLEXES:  deep tendon reflexes --> BUE 1, BLE 2 and symmetric  GAIT/STATION:  narrow based gait     DIAGNOSTIC DATA (LABS, IMAGING, TESTING) - I reviewed patient records, labs, notes, testing and imaging myself where available.  Lab Results  Component Value Date   WBC 7.3 05/26/2022   HGB 12.5 05/26/2022   HCT 36.9 05/26/2022   MCV 87.2 05/26/2022   PLT 129 (L) 05/26/2022      Component Value Date/Time   NA 136 06/08/2022 0943   K 3.4 (L) 06/08/2022 0943   CL 100 06/08/2022 0943   CO2 25 06/08/2022 0943   GLUCOSE 152 (H) 06/08/2022 0943   BUN 9 06/08/2022 0943   CREATININE 0.65 06/08/2022 0943   CALCIUM 9.3 06/08/2022 0943   PROT 7.4 06/07/2021 1500   ALBUMIN 4.7 06/07/2021 1500   AST 18 06/07/2021 1500   ALT 17 06/07/2021 1500   ALKPHOS 75 06/07/2021 1500   BILITOT 0.6 06/07/2021 1500   GFRNONAA >60  06/08/2022 0943   GFRAA >90 09/17/2012 1130   Lab Results  Component Value Date   CHOL 118 06/07/2021   HDL 41.30 06/07/2021   LDLCALC 37 06/07/2021   LDLDIRECT 73.0 08/21/2014   TRIG 197.0 (H) 06/07/2021   CHOLHDL 3 06/07/2021   Lab Results  Component Value Date   HGBA1C 6.1 06/07/2021   No results found for: "VITAMINB12" Lab Results  Component Value Date   TSH 2.14 06/07/2021      ASSESSMENT AND PLAN  72 y.o. year old female here with:   Dx:  1. Episodes of formed visual hallucinations     PLAN:  hallucinations / paranoia (since 2023; non-threatening; evening time) -  now resolved since stopping ambien - monitor symptoms; follow up with psychiatry - if symptoms worsen, then consider MRI brain  Return for pending if symptoms worsen or fail to improve, return to PCP.    Suanne Marker, MD 07/11/2022, 12:16 PM Certified in Neurology, Neurophysiology and Neuroimaging  [facility] [address], [address] [place], [place] [address] [phone]

## 2022-07-11 NOTE — Telephone Encounter (Signed)
?   Too early  Filled 4/23

## 2022-07-11 NOTE — Patient Instructions (Signed)
hallucinations / paranoia  - now resolved since stopping ambien - monitor symptoms; follow up with psychiatry - if symptoms worsen, then consider MRI brain

## 2022-07-12 NOTE — Telephone Encounter (Signed)
With directions that are given if pt takes 2 tabs every 8 hrs it's a 10 day Rx, so refill would be due on 07/14/22

## 2022-07-13 DIAGNOSIS — M5416 Radiculopathy, lumbar region: Secondary | ICD-10-CM | POA: Diagnosis not present

## 2022-07-13 DIAGNOSIS — M47816 Spondylosis without myelopathy or radiculopathy, lumbar region: Secondary | ICD-10-CM | POA: Diagnosis not present

## 2022-07-13 DIAGNOSIS — M47812 Spondylosis without myelopathy or radiculopathy, cervical region: Secondary | ICD-10-CM | POA: Diagnosis not present

## 2022-07-13 DIAGNOSIS — M4316 Spondylolisthesis, lumbar region: Secondary | ICD-10-CM | POA: Diagnosis not present

## 2022-07-18 ENCOUNTER — Ambulatory Visit
Admission: RE | Admit: 2022-07-18 | Discharge: 2022-07-18 | Disposition: A | Discharge status: Home / Self Care | Payer: Medicare Other | Source: Ambulatory Visit | Attending: Internal Medicine | Admitting: Internal Medicine

## 2022-07-18 DIAGNOSIS — E042 Nontoxic multinodular goiter: Secondary | ICD-10-CM | POA: Diagnosis not present

## 2022-07-19 DIAGNOSIS — F411 Generalized anxiety disorder: Secondary | ICD-10-CM | POA: Diagnosis not present

## 2022-07-19 DIAGNOSIS — F5101 Primary insomnia: Secondary | ICD-10-CM | POA: Diagnosis not present

## 2022-07-19 DIAGNOSIS — F332 Major depressive disorder, recurrent severe without psychotic features: Secondary | ICD-10-CM | POA: Diagnosis not present

## 2022-07-25 DIAGNOSIS — E042 Nontoxic multinodular goiter: Secondary | ICD-10-CM | POA: Diagnosis not present

## 2022-08-08 ENCOUNTER — Other Ambulatory Visit: Payer: Self-pay | Admitting: Family Medicine

## 2022-08-08 DIAGNOSIS — F411 Generalized anxiety disorder: Secondary | ICD-10-CM | POA: Diagnosis not present

## 2022-08-08 DIAGNOSIS — F5101 Primary insomnia: Secondary | ICD-10-CM | POA: Diagnosis not present

## 2022-08-08 DIAGNOSIS — F332 Major depressive disorder, recurrent severe without psychotic features: Secondary | ICD-10-CM | POA: Diagnosis not present

## 2022-08-08 NOTE — Telephone Encounter (Signed)
Name of Medication: Tramadol Name of Pharmacy: [facility] Last Fill or Written Date and Quantity: 07/12/22 #60 tabs/ 0 refills Last Office Visit and Type: Back Pain on 06/21/22 Next Office Visit and Type: none scheduled

## 2022-08-08 NOTE — Telephone Encounter (Signed)
Looks like Dr. Patsy Lager filled med last on 11/30/21 #30 tabs/ 3 refills, last OV was a f/u on back on 06/21/22

## 2022-08-09 ENCOUNTER — Ambulatory Visit: Payer: Medicare Other | Attending: Family | Admitting: Family

## 2022-08-09 ENCOUNTER — Encounter: Payer: Self-pay | Admitting: Family

## 2022-08-09 ENCOUNTER — Other Ambulatory Visit: Payer: Self-pay | Admitting: Family Medicine

## 2022-08-09 ENCOUNTER — Encounter: Payer: Medicare Other | Admitting: Family

## 2022-08-09 VITALS — BP 110/52 | HR 63 | Wt 204.0 lb

## 2022-08-09 DIAGNOSIS — I509 Heart failure, unspecified: Secondary | ICD-10-CM | POA: Insufficient documentation

## 2022-08-09 DIAGNOSIS — E079 Disorder of thyroid, unspecified: Secondary | ICD-10-CM | POA: Diagnosis not present

## 2022-08-09 DIAGNOSIS — Z7901 Long term (current) use of anticoagulants: Secondary | ICD-10-CM | POA: Insufficient documentation

## 2022-08-09 DIAGNOSIS — I5032 Chronic diastolic (congestive) heart failure: Secondary | ICD-10-CM

## 2022-08-09 DIAGNOSIS — Z1231 Encounter for screening mammogram for malignant neoplasm of breast: Secondary | ICD-10-CM

## 2022-08-09 DIAGNOSIS — J449 Chronic obstructive pulmonary disease, unspecified: Secondary | ICD-10-CM | POA: Diagnosis not present

## 2022-08-09 DIAGNOSIS — I4819 Other persistent atrial fibrillation: Secondary | ICD-10-CM

## 2022-08-09 DIAGNOSIS — Z79899 Other long term (current) drug therapy: Secondary | ICD-10-CM | POA: Diagnosis not present

## 2022-08-09 DIAGNOSIS — I428 Other cardiomyopathies: Secondary | ICD-10-CM | POA: Diagnosis not present

## 2022-08-09 DIAGNOSIS — F32A Depression, unspecified: Secondary | ICD-10-CM | POA: Diagnosis not present

## 2022-08-09 DIAGNOSIS — E785 Hyperlipidemia, unspecified: Secondary | ICD-10-CM | POA: Diagnosis not present

## 2022-08-09 DIAGNOSIS — R441 Visual hallucinations: Secondary | ICD-10-CM | POA: Diagnosis not present

## 2022-08-09 DIAGNOSIS — I10 Essential (primary) hypertension: Secondary | ICD-10-CM | POA: Diagnosis not present

## 2022-08-09 DIAGNOSIS — F419 Anxiety disorder, unspecified: Secondary | ICD-10-CM | POA: Insufficient documentation

## 2022-08-09 DIAGNOSIS — I480 Paroxysmal atrial fibrillation: Secondary | ICD-10-CM | POA: Diagnosis not present

## 2022-08-09 DIAGNOSIS — J4489 Other specified chronic obstructive pulmonary disease: Secondary | ICD-10-CM | POA: Diagnosis not present

## 2022-08-09 DIAGNOSIS — I110 Hypertensive heart disease with heart failure: Secondary | ICD-10-CM | POA: Insufficient documentation

## 2022-08-09 NOTE — Progress Notes (Signed)
PCP: Roxy Manns, MD (last seen 04/24) Primary Cardiologist: Julien Nordmann, MD (last seen 02/24)  HPI:  Anita Harrell is a 72 y/o female with a history of atrial fibrillation, hyperlipidemia, HTN, thyroid disease, anxiety, COPD, asthma, depression, panic attacks and chronic heart failure. Cardioversion cancelled 03/02/22 because she was back in [facility].   Echo 02/14/22: EF of 50-55% along with mild LVH  Stress test 12/21/21 was normal.   Admitted 02/13/22 due to shortness of breath along with dry cough, wheezing, no chest pain, fever or chills. Placed on oxygen due to hypoxia but then weaned off oxygen. Discharged after 2 days.   She presents today for HF follow-up visit with a chief complaint of minimal SOB w/ exertion. Chronic in nature. Has associated fatigue, dizziness on occasion, chronic difficulty sleeping & LBP along with this. Denies cough, palpitations, chest pain, pedal edema or dizziness.   Reports having a good appetite. Doesn't use salt but does use NoSalt for seasoning. Has not taken her diuretic in the last 2 days because she's had numerous appointments. Will not take it today either because they are leaving here and headed to [facility]. She says that she will resume taking this tomorrow.   ROS: All systems negative except as listed in HPI, PMH and Problem List.  SH:  Social History   Socioeconomic History   Marital status: Married    Spouse name: Not on file   Number of children: Not on file   Years of education: Not on file   Highest education level: Not on file  Occupational History   Not on file  Tobacco Use   Smoking status: Former    Packs/day: 1    Types: E-cigarettes, Cigarettes    Quit date: 2011    Years since quitting: 13.4   Smokeless tobacco: Never   Tobacco comments:    She is vaping, former smoker    Building services engineer Use: Every day  Substance and Sexual Activity   Alcohol use: No    Alcohol/week: 0.0 standard drinks of alcohol   Drug use: No    Sexual activity: Yes    Birth control/protection: Post-menopausal  Other Topics Concern   Not on file  Social History Narrative   Lives with husband and 2 pets; dogs.   Social Determinants of Health   Financial Resource Strain: Low Risk  (05/18/2022)   Overall Financial Resource Strain (CARDIA)    Difficulty of Paying Living Expenses: Not hard at all  Food Insecurity: No Food Insecurity (05/18/2022)   Hunger Vital Sign    Worried About Running Out of Food in the Last Year: Never true    Ran Out of Food in the Last Year: Never true  Transportation Needs: No Transportation Needs (05/18/2022)   PRAPARE - Administrator, Civil Service (Medical): No    Lack of Transportation (Non-Medical): No  Physical Activity: Inactive (05/18/2022)   Exercise Vital Sign    Days of Exercise per Week: 0 days    Minutes of Exercise per Session: 0 min  Stress: Stress Concern Present (05/18/2022)   [employer] of Occupational Health - Occupational Stress Questionnaire    Feeling of Stress : To some extent  Social Connections: Moderately Integrated (05/18/2022)   Social Connection and Isolation Panel [NHANES]    Frequency of Communication with Friends and Family: More than three times a week    Frequency of Social Gatherings with Friends and Family: More than three times a week  Attends Religious Services: More than 4 times per year    Active Member of Clubs or Organizations: No    Attends Banker Meetings: Never    Marital Status: Married  Catering manager Violence: Not At Risk (05/18/2022)   Humiliation, Afraid, Rape, and Kick questionnaire    Fear of Current or Ex-Partner: No    Emotionally Abused: No    Physically Abused: No    Sexually Abused: No    FH:  Family History  Problem Relation Age of Onset   Hypertension Mother    Stroke Mother    Alcohol abuse Father    Cancer Father        lung CA   Heart disease Father        CHF   Cancer Sister        breast CA    Heart disease Sister        CHF from chemotx also has defib   Breast cancer Sister    Colon cancer Neg Hx    Esophageal cancer Neg Hx    Rectal cancer Neg Hx    Stomach cancer Neg Hx     Past Medical History:  Diagnosis Date   Allergy    allergic rhinitis   Anxiety    Arrhythmia    atrial fibrillation   Back pain    Chest pain    CHF (congestive heart failure) (HCC)    COPD (chronic obstructive pulmonary disease) (HCC)    Depression    Epigastric pain    Goiter    History of tobacco abuse    Hyperglycemia    mild   Hyperlipidemia    Hypertension    Insomnia    Hx of   Labile blood pressure    Panic disorder    History of   Personal history of colonic polyps 06/09/2004   SVD (spontaneous vaginal delivery)    x 2    Current Outpatient Medications  Medication Sig Dispense Refill   acetaminophen (TYLENOL) 500 MG tablet Take 500 mg by mouth every 6 (six) hours as needed.     albuterol (VENTOLIN HFA) 108 (90 Base) MCG/ACT inhaler Inhale 2 puffs into the lungs every 6 (six) hours as needed for wheezing or shortness of breath. 8 g 2   apixaban (ELIQUIS) 5 MG TABS tablet TAKE 1 TABLET BY MOUTH TWICE A DAY 60 tablet 5   busPIRone (BUSPAR) 30 MG tablet TAKE ONE HALF TO 1 TABLET BY MOUTH 3 TIMES DAILY AS DIRECTED 270 tablet 1   cyclobenzaprine (FLEXERIL) 10 MG tablet TAKE ONE HALF (1/2) TO ONE TABLET BY MOUTH AT BEDTIME AS NEEDED FOR MUSCLE SPASMS 30 tablet 3   diltiazem (CARDIZEM CD) 120 MG 24 hr capsule Take 1 capsule (120 mg total) by mouth daily. 90 capsule 3   DULoxetine (CYMBALTA) 60 MG capsule Take 60 mg by mouth daily.     flecainide (TAMBOCOR) 100 MG tablet Take 1 tablet (100 mg total) by mouth 2 (two) times daily. 180 tablet 3   Fluticasone-Umeclidin-Vilant (TRELEGY ELLIPTA) 100-62.5-25 MCG/ACT AEPB Inhale 1 puff into the lungs daily. 28 each 11   Fluticasone-Umeclidin-Vilant (TRELEGY ELLIPTA) 100-62.5-25 MCG/ACT AEPB Inhale 1 puff into the lungs daily. (Patient not  taking: Reported on 06/23/2022) 14 each 0   furosemide (LASIX) 20 MG tablet Take 1 tablet (20 mg total) by mouth daily. 90 tablet 3   hydrOXYzine (VISTARIL) 25 MG capsule Take 25 mg by mouth 2 (two) times daily.  Melatonin 5 MG CAPS Take by mouth.     metoprolol succinate (TOPROL-XL) 50 MG 24 hr tablet TAKE 1 TABLET BY MOUTH TWICE A DAY WITH MEALS 180 tablet 1   mirabegron ER (MYRBETRIQ) 25 MG TB24 tablet Take 1 tablet (25 mg total) by mouth daily. 90 tablet 3   PARoxetine (PAXIL) 10 MG tablet Take 1 tablet by mouth daily.     potassium chloride (KLOR-CON) 10 MEQ tablet Take 3 tablets (30 mEq total) by mouth daily. 270 tablet 3   predniSONE (DELTASONE) 10 MG tablet Take 4 pills once daily by mouth for 3 days, then 3 pills daily for 3 days, then 2 pills daily for 3 days then 1 pill daily for 3 days then stop 30 tablet 0   rosuvastatin (CRESTOR) 10 MG tablet TAKE 1 TABLET BY MOUTH ONCE A DAY 90 tablet 3   traMADol (ULTRAM) 50 MG tablet TAKE ONE (1) TO TWO (2) TABLETS BY MOUTH EVERY EIGHT HOURS AS NEEDED FOR MODERATE PAIN 60 tablet 0   traZODone (DESYREL) 50 MG tablet Take 50 mg by mouth at bedtime as needed.     No current facility-administered medications for this visit.   Vitals:   08/09/22 1302  BP: (!) 110/52  Pulse: 63  SpO2: 98%  Weight: 204 lb (92.5 kg)   Wt Readings from Last 3 Encounters:  08/09/22 204 lb (92.5 kg)  07/11/22 206 lb 9.6 oz (93.7 kg)  06/23/22 204 lb 6.4 oz (92.7 kg)   Lab Results  Component Value Date   CREATININE 0.65 06/08/2022   CREATININE 0.59 06/01/2022   CREATININE 0.80 02/21/2022   PHYSICAL EXAM:  General:  Well appearing. No resp difficulty HEENT: normal Neck: supple. JVP flat. No lymphadenopathy or thryomegaly appreciated. Cor: PMI normal. Regular rate & rhythm. No rubs, gallops or murmurs. Lungs: clear Abdomen: soft, nontender, nondistended. No hepatosplenomegaly. No bruits or masses.  Extremities: no cyanosis, clubbing, rash, trace pitting  edema bilateral lower legs Neuro: alert & orientedx3, cranial nerves grossly intact. Moves all 4 extremities w/o difficulty. Affect pleasant.   ECG: not done   ASSESSMENT & PLAN:  1: NICM with preserved ejection fraction with LVH- - likely due to HTN/ PAF - NYHA class II - euvolemic today - weighing daily; understands to call for an overnight weight gain of > 2 pounds or a weekly weight gain of > 5 pounds - weight up 4 pounds from last visit here 2 months ago - echo 02/14/22: EF of 50-55% along with mild LVH - stress test 12/21/21 was normal.  - not adding salt and is using salt substitute/ Mrs Sharilyn Sites - continue furosemide 20mg  daily/ potassium daily; she has not taken this the last 2 days due to appts but will resume tomorrow; is leaving here to head to [facility] for vacation - continue metoprolol succinate 50mg  BID - really isn't interested in changing/ adding any new medications at this time as she feels like she is already taking "too much" - BNP 02/13/22 was 128.9 - PharmD reconciled meds w/ patient  2: HTN- - BP 110/52 - saw PCP (Tower) 04/24 - BMP 06/08/22 reviewed and showed sodium 136, potassium 3.4, creatinine 0.65 & GFR >60  3: Paroxysmal Atrial fibrillation- - saw cardiology Mariah Milling) 02/24 - DCCV 03/02/22 cancelled as patient was in NSR - continue apixaban 5mg  BID - continue diltiazem 120mg  daily - continue flecainide 100mg  BID  4: COPD- - saw pulmonology (Parrett) 04/24 - had PFT's done 02/23/22  5:  Visual hallucinations- - saw neurology (Penumalli) 04/24 - hallucinations stopped after stopping ambien - brain MRI to be considered if symptoms re-occur/ worsen - to follow-up with psychiatry  Return in 6 months, sooner if needed.

## 2022-08-09 NOTE — Progress Notes (Signed)
[facility] HEART FAILURE CLINIC - Pharmacist Note  Anita Harrell is a 72 y.o. female with HFpEF (EF >50%) presenting to the Heart Failure Clinic for follow up. Patient presents today with her husband who helps manage her medications. She reports checking her weight daily and having lost some weight recently. She has since gained some of that weight back. She reports no signs or symptoms of volume overload. She states that she has not taken lasix in 2 days due to her doctor's appointments. She reports no issues on current regimen and no medication access issues.  Recent ED Visit (past 6 months):  Date: 02/13/2022, CC: shortness of breath  Guideline-Directed Medical Therapy/Evidence Based Medicine ACE/ARB/ARNI: none Beta Blocker: Metoprolol succinate 50 mg daily Aldosterone Antagonist: none Diuretic: Furosemide 20 mg daily SGLT2i:  none  Adherence Assessment Do you ever forget to take your medication? [] Yes [x] No  Do you ever skip doses due to side effects? [] Yes [x] No  Do you have trouble affording your medicines? [] Yes [x] No  Are you ever unable to pick up your medication due to transportation difficulties? [] Yes [x] No  Do you ever stop taking your medications because you don't believe they are helping? [] Yes [x] No  Do you check your weight daily? [x] Yes [] No  Adherence strategy: pillbox, husband manages medications Barriers to obtaining medications: none reported   Diagnostics ECHO: Date 02/14/2022, EF 50-55%, no RWMA, mild LVH  Vitals    07/11/2022   11:35 AM 06/23/2022   11:31 AM 06/21/2022   11:59 AM  Vitals with BMI  Height 5\' 3"  5\' 3"  5\' 3"   Weight 206 lbs 10 oz 204 lbs 6 oz 201 lbs 8 oz  BMI 36.61 36.22 35.7  Systolic 120 122 161  Diastolic 72 78 70  Pulse 57 62 73     Recent Labs    Latest Ref Rng & Units 06/08/2022    9:43 AM 06/01/2022   10:39 AM 02/21/2022   11:28 AM  BMP  Glucose 70 - 99 mg/dL 096  045  409   BUN 8 - 23 mg/dL 9  9  15    Creatinine 0.44 -  1.00 mg/dL 8.11  9.14  7.82   Sodium 135 - 145 mmol/L 136  139  138   Potassium 3.5 - 5.1 mmol/L 3.4  3.2  3.4   Chloride 98 - 111 mmol/L 100  101  107   CO2 22 - 32 mmol/L 25  26  23    Calcium 8.9 - 10.3 mg/dL 9.3  9.2  9.4     Past Medical History Past Medical History:  Diagnosis Date   Allergy    allergic rhinitis   Anxiety    Arrhythmia    atrial fibrillation   Back pain    Chest pain    CHF (congestive heart failure) (HCC)    COPD (chronic obstructive pulmonary disease) (HCC)    Depression    Epigastric pain    Goiter    History of tobacco abuse    Hyperglycemia    mild   Hyperlipidemia    Hypertension    Insomnia    Hx of   Labile blood pressure    Panic disorder    History of   Personal history of colonic polyps 06/09/2004   SVD (spontaneous vaginal delivery)    x 2    Plan Continue regimen as directed by NP Addition of SGLT2 inhibitor limited by hypotension Annual echo due 02/2023  Time spent: 10 minutes  [employer],  PharmD PGY1 Pharmacy Resident 08/09/2022 12:59 PM

## 2022-08-14 DIAGNOSIS — M5416 Radiculopathy, lumbar region: Secondary | ICD-10-CM | POA: Diagnosis not present

## 2022-08-17 ENCOUNTER — Other Ambulatory Visit: Payer: Self-pay | Admitting: Family Medicine

## 2022-08-17 NOTE — Telephone Encounter (Signed)
This was a auto refill request pt doesn't need med filled she has plenty of med left. Pt is still going to spine clinic and they are doing injections in her back so they told her that should start helping with the pain soon so she will not need as much pain med going forward.   Rx declined and pt aware  FYI to PCP

## 2022-08-17 NOTE — Telephone Encounter (Signed)
Name of Medication: Tramadol Name of Pharmacy: [facility] Last Fill or Written Date and Quantity: 08/08/22 #60 tabs/ 0 refills Last Office Visit and Type: Back Pain on 06/21/22 Next Office Visit and Type: none scheduled

## 2022-08-17 NOTE — Telephone Encounter (Signed)
Just revilled on 5/28 I think Please ask how much she is averaging per day due to pain  How is her back/leg pain?  (She is going to spine clinic as well) They may have tried gabapentin-if so is it helping?  Just want to touch base before refilling

## 2022-08-28 ENCOUNTER — Encounter: Payer: Self-pay | Admitting: *Deleted

## 2022-08-29 DIAGNOSIS — M47816 Spondylosis without myelopathy or radiculopathy, lumbar region: Secondary | ICD-10-CM | POA: Diagnosis not present

## 2022-08-29 DIAGNOSIS — M5416 Radiculopathy, lumbar region: Secondary | ICD-10-CM | POA: Diagnosis not present

## 2022-08-30 ENCOUNTER — Ambulatory Visit
Admission: RE | Admit: 2022-08-30 | Discharge: 2022-08-30 | Disposition: A | Discharge status: Home / Self Care | Payer: Medicare Other | Source: Ambulatory Visit | Attending: Family Medicine | Admitting: Family Medicine

## 2022-08-30 DIAGNOSIS — F411 Generalized anxiety disorder: Secondary | ICD-10-CM | POA: Diagnosis not present

## 2022-08-30 DIAGNOSIS — F5101 Primary insomnia: Secondary | ICD-10-CM | POA: Diagnosis not present

## 2022-08-30 DIAGNOSIS — F332 Major depressive disorder, recurrent severe without psychotic features: Secondary | ICD-10-CM | POA: Diagnosis not present

## 2022-08-30 DIAGNOSIS — Z1231 Encounter for screening mammogram for malignant neoplasm of breast: Secondary | ICD-10-CM | POA: Diagnosis not present

## 2022-09-11 ENCOUNTER — Other Ambulatory Visit: Payer: Self-pay | Admitting: Cardiovascular Disease

## 2022-09-11 ENCOUNTER — Other Ambulatory Visit: Payer: Self-pay | Admitting: Family Medicine

## 2022-09-11 NOTE — Telephone Encounter (Signed)
Prescription refill request for Eliquis received. Indication: AF Last office visit: 04/17/22  Concha Se MD Scr: 0.65 on 06/08/22  Epic Age: 72 Weight: 94.5kg  Based on above findings Eliquis 5mg  twice daily is the appropriate dose.  Refill approved.

## 2022-09-11 NOTE — Telephone Encounter (Signed)
Refill request

## 2022-09-12 NOTE — Telephone Encounter (Signed)
Name of Medication: Tramadol Name of Pharmacy: [facility] Last Fill or Written Date and Quantity: 08/08/22 #60 tabs/ 0 refills Last Office Visit and Type: Back Pain on 06/21/22 Next Office Visit and Type: none scheduled 

## 2022-09-19 ENCOUNTER — Other Ambulatory Visit: Payer: Self-pay | Admitting: Family Medicine

## 2022-09-27 DIAGNOSIS — F411 Generalized anxiety disorder: Secondary | ICD-10-CM | POA: Diagnosis not present

## 2022-09-27 DIAGNOSIS — F5101 Primary insomnia: Secondary | ICD-10-CM | POA: Diagnosis not present

## 2022-09-27 DIAGNOSIS — F03A3 Unspecified dementia, mild, with mood disturbance: Secondary | ICD-10-CM | POA: Diagnosis not present

## 2022-09-27 DIAGNOSIS — F332 Major depressive disorder, recurrent severe without psychotic features: Secondary | ICD-10-CM | POA: Diagnosis not present

## 2022-10-25 ENCOUNTER — Other Ambulatory Visit: Payer: Self-pay | Admitting: Family Medicine

## 2022-10-25 ENCOUNTER — Other Ambulatory Visit: Payer: Self-pay | Admitting: Cardiovascular Disease

## 2022-10-25 NOTE — Telephone Encounter (Signed)
Cyclobenzaprine may not be due yet-has refills? Is she taking more than directions note? (If so how much)? Please let me know then I will address both refills Thanks

## 2022-10-26 NOTE — Telephone Encounter (Signed)
Patient is scheduled on 10/8

## 2022-10-26 NOTE — Telephone Encounter (Signed)
Called [facility] and it was them. They just refill her last refill on 10/24/22 and sent it for an auto refill. I advise them PCP isn't going to refill it early and they will just send a new refill once it's actually due in Sept.   Flexeril declined and sent tramadol refill back to PCP

## 2022-11-08 DIAGNOSIS — F5101 Primary insomnia: Secondary | ICD-10-CM | POA: Diagnosis not present

## 2022-11-08 DIAGNOSIS — F332 Major depressive disorder, recurrent severe without psychotic features: Secondary | ICD-10-CM | POA: Diagnosis not present

## 2022-11-08 DIAGNOSIS — F411 Generalized anxiety disorder: Secondary | ICD-10-CM | POA: Diagnosis not present

## 2022-11-08 DIAGNOSIS — F03A3 Unspecified dementia, mild, with mood disturbance: Secondary | ICD-10-CM | POA: Diagnosis not present

## 2022-11-10 ENCOUNTER — Other Ambulatory Visit: Payer: Self-pay | Admitting: Family Medicine

## 2022-11-10 NOTE — Telephone Encounter (Signed)
Name of Medication: Tramadol Name of Pharmacy: [facility]  Last Fill or Written Date and Quantity: 10/26/22 # 60/ 0 refills (10 day Rx) Last Office Visit and Type: f/u on back 06/21/22 Next Office Visit and Type: none scheduled

## 2022-11-15 ENCOUNTER — Encounter: Payer: Self-pay | Admitting: Internal Medicine

## 2022-11-15 ENCOUNTER — Ambulatory Visit (INDEPENDENT_AMBULATORY_CARE_PROVIDER_SITE_OTHER): Payer: Medicare Other | Admitting: Internal Medicine

## 2022-11-15 VITALS — BP 110/60 | HR 80 | Temp 98.6°F | Ht 63.0 in | Wt 193.0 lb

## 2022-11-15 DIAGNOSIS — U071 COVID-19: Secondary | ICD-10-CM | POA: Insufficient documentation

## 2022-11-15 NOTE — Assessment & Plan Note (Signed)
Fairly mild symptoms  Discussed antivirals---too many medication issues (especially the flecainide) Analgesics, cough med To ER if develops SOB Isolate through this week---can be out next week with mask if feels better

## 2022-11-15 NOTE — Progress Notes (Signed)
Subjective:    Patient ID: Anita Harrell, female    DOB: [birth year], 72 y.o.   MRN: [id number]  HPI Here due to COVID infection With husband  Was at the beach--started feeling bad 2 days ago Got home yesterday---tested for COVID and was positive Feels "really sick" Bad cough and congestion No headache Some chills and fever last night Has muscle aching No sore throat or ear pain No SOB  Coricidin---may have helped congestion Tylenol regularly --and some extra with this  Current Outpatient Medications on File Prior to Visit  Medication Sig Dispense Refill   acetaminophen (TYLENOL) 500 MG tablet Take 500 mg by mouth every 6 (six) hours as needed.     albuterol (VENTOLIN HFA) 108 (90 Base) MCG/ACT inhaler Inhale 2 puffs into the lungs every 6 (six) hours as needed for wheezing or shortness of breath. 8 g 2   ALPRAZolam (XANAX) 0.25 MG tablet Take by mouth.     cyclobenzaprine (FLEXERIL) 10 MG tablet TAKE ONE HALF (1/2) TO ONE TABLET BY MOUTH AT BEDTIME AS NEEDED FOR MUSCLE SPASMS (Patient taking differently: Take 10 mg by mouth 3 (three) times daily as needed for muscle spasms.) 30 tablet 3   diltiazem (CARDIZEM CD) 120 MG 24 hr capsule Take 1 capsule (120 mg total) by mouth daily. 90 capsule 3   DULoxetine (CYMBALTA) 60 MG capsule Take 60 mg by mouth daily.     ELIQUIS 5 MG TABS tablet TAKE ONE TABLET BY MOUTH TWICE A DAY 60 tablet 5   flecainide (TAMBOCOR) 100 MG tablet Take 1 tablet (100 mg total) by mouth 2 (two) times daily. 180 tablet 3   Fluticasone-Umeclidin-Vilant (TRELEGY ELLIPTA) 100-62.5-25 MCG/ACT AEPB Inhale 1 puff into the lungs daily. 28 each 11   furosemide (LASIX) 20 MG tablet Take 1 tablet (20 mg total) by mouth daily. 90 tablet 3   gabapentin (NEURONTIN) 300 MG capsule Take by mouth.     hydrOXYzine (VISTARIL) 25 MG capsule Take 25 mg by mouth 2 (two) times daily.     Melatonin 5 MG CAPS Take by mouth.     metoprolol succinate (TOPROL-XL) 50 MG 24 hr tablet  TAKE ONE TABLET BY MOUTH TWICE A DAY WITH MEALS 180 tablet 0   mirabegron ER (MYRBETRIQ) 25 MG TB24 tablet Take 1 tablet (25 mg total) by mouth daily. 90 tablet 3   PARoxetine (PAXIL) 10 MG tablet Take 1 tablet by mouth daily.     potassium chloride (KLOR-CON) 10 MEQ tablet Take 3 tablets (30 mEq total) by mouth daily. 270 tablet 3   rosuvastatin (CRESTOR) 10 MG tablet TAKE 1 TABLET BY MOUTH ONCE A DAY 90 tablet 3   traMADol (ULTRAM) 50 MG tablet TAKE ONE (1) TO TWO (2) TABLETS BY MOUTH EVERY EIGHT HOURS AS NEEDED FOR MODERATE PAIN 60 tablet 0   traZODone (DESYREL) 50 MG tablet Take 50 mg by mouth at bedtime as needed.     No current facility-administered medications on file prior to visit.    Allergies  Allergen Reactions   Abilify [Aripiprazole]     Vision problem   Ambien [Zolpidem]     Hallucinations Ms change    Codeine Nausea And Vomiting    REACTION: Nausea and vomiting Pt has tolerated vicodin & percocet in the past   Cymbalta [Duloxetine Hcl]     More depressed/tearful    Lipitor [Atorvastatin]     Muscle pain    Vesicare [Solifenacin]     MS change  Wellbutrin [Bupropion]     Past Medical History:  Diagnosis Date   Allergy    allergic rhinitis   Anxiety    Arrhythmia    atrial fibrillation   Back pain    Chest pain    CHF (congestive heart failure) (HCC)    COPD (chronic obstructive pulmonary disease) (HCC)    Depression    Epigastric pain    Goiter    History of tobacco abuse    Hyperglycemia    mild   Hyperlipidemia    Hypertension    Insomnia    Hx of   Labile blood pressure    Panic disorder    History of   Personal history of colonic polyps 06/09/2004   SVD (spontaneous vaginal delivery)    x 2    Past Surgical History:  Procedure Laterality Date   ABDOMINAL HYSTERECTOMY N/A 09/24/2012   Procedure: HYSTERECTOMY ABDOMINAL;  Surgeon: Miguel Aschoff, MD;  Location: [facility];  Service: Gynecology;  Laterality: N/A;   BREAST BIOPSY Right 2016    CARDIOVERSION N/A 02/09/2022   Procedure: CARDIOVERSION;  Surgeon: Antonieta Iba, MD;  Location: [facility];  Service: Cardiovascular;  Laterality: N/A;   CARDIOVERSION N/A 03/02/2022   Procedure: CARDIOVERSION;  Surgeon: Antonieta Iba, MD;  Location: [facility];  Service: Cardiovascular;  Laterality: N/A;   COLONOSCOPY     COLONOSCOPY  2017   ELBOW SURGERY  2004   EYE SURGERY     bilateral lasik   SALPINGOOPHORECTOMY Bilateral 09/24/2012   Procedure: SALPINGO OOPHORECTOMY;  Surgeon: Miguel Aschoff, MD;  Location: [facility];  Service: Gynecology;  Laterality: Bilateral;   THYROID SURGERY     B9 massess   WART FULGURATION N/A 09/24/2012   Procedure: FULGURATION VAGINAL WART;  Surgeon: Miguel Aschoff, MD;  Location: [facility];  Service: Gynecology;  Laterality: N/A;   WISDOM TOOTH EXTRACTION      Family History  Problem Relation Age of Onset   Hypertension Mother    Stroke Mother    Alcohol abuse Father    Cancer Father        lung CA   Heart disease Father        CHF   Cancer Sister        breast CA   Heart disease Sister        CHF from chemotx also has defib   Breast cancer Sister    Colon cancer Neg Hx    Esophageal cancer Neg Hx    Rectal cancer Neg Hx    Stomach cancer Neg Hx     Social History   Socioeconomic History   Marital status: Married    Spouse name: Not on file   Number of children: Not on file   Years of education: Not on file   Highest education level: Not on file  Occupational History   Not on file  Tobacco Use   Smoking status: Former    Current packs/day: 0.00    Types: E-cigarettes, Cigarettes    Quit date: 2011    Years since quitting: 13.6   Smokeless tobacco: Never   Tobacco comments:    She is vaping, former smoker    Advertising account planner   Vaping status: Every Day  Substance and Sexual Activity   Alcohol use: No    Alcohol/week: 0.0 standard drinks of alcohol   Drug use: No   Sexual activity: Yes    Birth control/protection: Post-menopausal  Other  Topics Concern   Not  on file  Social History Narrative   Lives with husband and 2 pets; dogs.   Social Determinants of Health   Financial Resource Strain: Low Risk  (05/18/2022)   Overall Financial Resource Strain (CARDIA)    Difficulty of Paying Living Expenses: Not hard at all  Food Insecurity: No Food Insecurity (05/18/2022)   Hunger Vital Sign    Worried About Running Out of Food in the Last Year: Never true    Ran Out of Food in the Last Year: Never true  Transportation Needs: No Transportation Needs (05/18/2022)   PRAPARE - Administrator, Civil Service (Medical): No    Lack of Transportation (Non-Medical): No  Physical Activity: Inactive (05/18/2022)   Exercise Vital Sign    Days of Exercise per Week: 0 days    Minutes of Exercise per Session: 0 min  Stress: Stress Concern Present (05/18/2022)   [employer] of Occupational Health - Occupational Stress Questionnaire    Feeling of Stress : To some extent  Social Connections: Moderately Integrated (05/18/2022)   Social Connection and Isolation Panel [NHANES]    Frequency of Communication with Friends and Family: More than three times a week    Frequency of Social Gatherings with Friends and Family: More than three times a week    Attends Religious Services: More than 4 times per year    Active Member of [employer] or Organizations: No    Attends Banker Meetings: Never    Marital Status: Married  Catering manager Violence: Not At Risk (05/18/2022)   Humiliation, Afraid, Rape, and Kick questionnaire    Fear of Current or Ex-Partner: No    Emotionally Abused: No    Physically Abused: No    Sexually Abused: No   Review of Systems Lost her appetite --ate soup Smell and taste normal No N/V    Objective:   Physical Exam Constitutional:      Appearance: Normal appearance.  HENT:     Head:     Comments: No sinus tenderness    Right Ear: Tympanic membrane and ear canal normal.     Left Ear: Tympanic membrane  and ear canal normal.     Mouth/Throat:     Pharynx: No oropharyngeal exudate or posterior oropharyngeal erythema.  Pulmonary:     Effort: Pulmonary effort is normal.     Breath sounds: Normal breath sounds. No wheezing or rales.  Musculoskeletal:     Cervical back: Neck supple.  Lymphadenopathy:     Cervical: No cervical adenopathy.  Neurological:     Mental Status: She is alert.            Assessment & Plan:

## 2022-11-18 ENCOUNTER — Emergency Department: Payer: Medicare Other

## 2022-11-18 ENCOUNTER — Other Ambulatory Visit: Payer: Self-pay | Admitting: Family Medicine

## 2022-11-18 ENCOUNTER — Other Ambulatory Visit: Payer: Self-pay

## 2022-11-18 ENCOUNTER — Inpatient Hospital Stay
Admission: EM | Admit: 2022-11-18 | Discharge: 2022-11-25 | DRG: 177 | Disposition: A | Discharge status: Home Health | Payer: Medicare Other | Attending: Internal Medicine | Admitting: Internal Medicine

## 2022-11-18 ENCOUNTER — Encounter: Payer: Self-pay | Admitting: Emergency Medicine

## 2022-11-18 DIAGNOSIS — I2510 Atherosclerotic heart disease of native coronary artery without angina pectoris: Secondary | ICD-10-CM | POA: Diagnosis not present

## 2022-11-18 DIAGNOSIS — I482 Chronic atrial fibrillation, unspecified: Secondary | ICD-10-CM | POA: Diagnosis present

## 2022-11-18 DIAGNOSIS — I10 Essential (primary) hypertension: Secondary | ICD-10-CM | POA: Diagnosis not present

## 2022-11-18 DIAGNOSIS — Z7901 Long term (current) use of anticoagulants: Secondary | ICD-10-CM

## 2022-11-18 DIAGNOSIS — J441 Chronic obstructive pulmonary disease with (acute) exacerbation: Secondary | ICD-10-CM | POA: Diagnosis not present

## 2022-11-18 DIAGNOSIS — D696 Thrombocytopenia, unspecified: Secondary | ICD-10-CM | POA: Insufficient documentation

## 2022-11-18 DIAGNOSIS — E872 Acidosis, unspecified: Secondary | ICD-10-CM | POA: Diagnosis not present

## 2022-11-18 DIAGNOSIS — Z86010 Personal history of colonic polyps: Secondary | ICD-10-CM | POA: Diagnosis not present

## 2022-11-18 DIAGNOSIS — I480 Paroxysmal atrial fibrillation: Secondary | ICD-10-CM | POA: Insufficient documentation

## 2022-11-18 DIAGNOSIS — I110 Hypertensive heart disease with heart failure: Secondary | ICD-10-CM | POA: Diagnosis not present

## 2022-11-18 DIAGNOSIS — Z87891 Personal history of nicotine dependence: Secondary | ICD-10-CM

## 2022-11-18 DIAGNOSIS — E785 Hyperlipidemia, unspecified: Secondary | ICD-10-CM | POA: Diagnosis not present

## 2022-11-18 DIAGNOSIS — F17290 Nicotine dependence, other tobacco product, uncomplicated: Secondary | ICD-10-CM | POA: Diagnosis not present

## 2022-11-18 DIAGNOSIS — E876 Hypokalemia: Secondary | ICD-10-CM | POA: Diagnosis not present

## 2022-11-18 DIAGNOSIS — J90 Pleural effusion, not elsewhere classified: Secondary | ICD-10-CM | POA: Diagnosis not present

## 2022-11-18 DIAGNOSIS — F419 Anxiety disorder, unspecified: Secondary | ICD-10-CM | POA: Diagnosis present

## 2022-11-18 DIAGNOSIS — Z8249 Family history of ischemic heart disease and other diseases of the circulatory system: Secondary | ICD-10-CM

## 2022-11-18 DIAGNOSIS — E669 Obesity, unspecified: Secondary | ICD-10-CM | POA: Diagnosis present

## 2022-11-18 DIAGNOSIS — Z90710 Acquired absence of both cervix and uterus: Secondary | ICD-10-CM

## 2022-11-18 DIAGNOSIS — Z7951 Long term (current) use of inhaled steroids: Secondary | ICD-10-CM

## 2022-11-18 DIAGNOSIS — R531 Weakness: Secondary | ICD-10-CM | POA: Diagnosis not present

## 2022-11-18 DIAGNOSIS — R590 Localized enlarged lymph nodes: Secondary | ICD-10-CM | POA: Diagnosis not present

## 2022-11-18 DIAGNOSIS — J1282 Pneumonia due to coronavirus disease 2019: Secondary | ICD-10-CM | POA: Diagnosis present

## 2022-11-18 DIAGNOSIS — E871 Hypo-osmolality and hyponatremia: Secondary | ICD-10-CM | POA: Diagnosis not present

## 2022-11-18 DIAGNOSIS — Z79899 Other long term (current) drug therapy: Secondary | ICD-10-CM | POA: Diagnosis not present

## 2022-11-18 DIAGNOSIS — J9601 Acute respiratory failure with hypoxia: Secondary | ICD-10-CM

## 2022-11-18 DIAGNOSIS — Z803 Family history of malignant neoplasm of breast: Secondary | ICD-10-CM

## 2022-11-18 DIAGNOSIS — U071 COVID-19: Secondary | ICD-10-CM | POA: Diagnosis not present

## 2022-11-18 DIAGNOSIS — Z811 Family history of alcohol abuse and dependence: Secondary | ICD-10-CM

## 2022-11-18 DIAGNOSIS — Z6834 Body mass index (BMI) 34.0-34.9, adult: Secondary | ICD-10-CM

## 2022-11-18 DIAGNOSIS — R918 Other nonspecific abnormal finding of lung field: Secondary | ICD-10-CM | POA: Diagnosis not present

## 2022-11-18 DIAGNOSIS — I5030 Unspecified diastolic (congestive) heart failure: Secondary | ICD-10-CM

## 2022-11-18 DIAGNOSIS — Z801 Family history of malignant neoplasm of trachea, bronchus and lung: Secondary | ICD-10-CM

## 2022-11-18 DIAGNOSIS — J9621 Acute and chronic respiratory failure with hypoxia: Secondary | ICD-10-CM

## 2022-11-18 DIAGNOSIS — I5032 Chronic diastolic (congestive) heart failure: Secondary | ICD-10-CM | POA: Diagnosis not present

## 2022-11-18 DIAGNOSIS — J159 Unspecified bacterial pneumonia: Secondary | ICD-10-CM | POA: Diagnosis present

## 2022-11-18 DIAGNOSIS — Z823 Family history of stroke: Secondary | ICD-10-CM

## 2022-11-18 DIAGNOSIS — R059 Cough, unspecified: Secondary | ICD-10-CM | POA: Diagnosis not present

## 2022-11-18 DIAGNOSIS — I5033 Acute on chronic diastolic (congestive) heart failure: Secondary | ICD-10-CM | POA: Diagnosis not present

## 2022-11-18 DIAGNOSIS — R410 Disorientation, unspecified: Secondary | ICD-10-CM | POA: Diagnosis not present

## 2022-11-18 DIAGNOSIS — J440 Chronic obstructive pulmonary disease with acute lower respiratory infection: Secondary | ICD-10-CM | POA: Diagnosis present

## 2022-11-18 DIAGNOSIS — E042 Nontoxic multinodular goiter: Secondary | ICD-10-CM | POA: Diagnosis not present

## 2022-11-18 DIAGNOSIS — I517 Cardiomegaly: Secondary | ICD-10-CM | POA: Diagnosis not present

## 2022-11-18 DIAGNOSIS — J180 Bronchopneumonia, unspecified organism: Secondary | ICD-10-CM | POA: Diagnosis not present

## 2022-11-18 LAB — RESPIRATORY PANEL BY PCR

## 2022-11-18 LAB — CBC WITH DIFFERENTIAL/PLATELET
Abs Immature Granulocytes: 0.33 10*3/uL — ABNORMAL HIGH (ref 0.00–0.07)
Basophils Absolute: 0 10*3/uL (ref 0.0–0.1)
Basophils Relative: 0 %
Eosinophils Absolute: 0 10*3/uL (ref 0.0–0.5)
Eosinophils Relative: 0 %
HCT: 39.2 % (ref 36.0–46.0)
Hemoglobin: 13.4 g/dL (ref 12.0–15.0)
Immature Granulocytes: 2 %
Lymphocytes Relative: 4 %
Lymphs Abs: 0.7 10*3/uL (ref 0.7–4.0)
MCH: 28.9 pg (ref 26.0–34.0)
MCHC: 34.2 g/dL (ref 30.0–36.0)
MCV: 84.5 fL (ref 80.0–100.0)
Monocytes Absolute: 5 10*3/uL — ABNORMAL HIGH (ref 0.1–1.0)
Monocytes Relative: 32 %
Neutro Abs: 9.7 10*3/uL — ABNORMAL HIGH (ref 1.7–7.7)
Neutrophils Relative %: 62 %
Platelets: 97 10*3/uL — ABNORMAL LOW (ref 150–400)
RBC: 4.64 MIL/uL (ref 3.87–5.11)
RDW: 13 % (ref 11.5–15.5)
WBC: 15.7 10*3/uL — ABNORMAL HIGH (ref 4.0–10.5)
nRBC: 0 % (ref 0.0–0.2)

## 2022-11-18 LAB — TECHNOLOGIST SMEAR REVIEW: Plt Morphology: NORMAL

## 2022-11-18 LAB — COMPREHENSIVE METABOLIC PANEL
ALT: 21 U/L (ref 0–44)
AST: 30 U/L (ref 15–41)
Albumin: 3.1 g/dL — ABNORMAL LOW (ref 3.5–5.0)
Alkaline Phosphatase: 93 U/L (ref 38–126)
Anion gap: 11 (ref 5–15)
BUN: 11 mg/dL (ref 8–23)
CO2: 21 mmol/L — ABNORMAL LOW (ref 22–32)
Calcium: 8.6 mg/dL — ABNORMAL LOW (ref 8.9–10.3)
Chloride: 102 mmol/L (ref 98–111)
Creatinine, Ser: 0.65 mg/dL (ref 0.44–1.00)
GFR, Estimated: >60 mL/min (ref >60)
Glucose, Bld: 138 mg/dL — ABNORMAL HIGH (ref 70–99)
Potassium: 2.9 mmol/L — ABNORMAL LOW (ref 3.5–5.1)
Sodium: 134 mmol/L — ABNORMAL LOW (ref 135–145)
Total Bilirubin: 0.4 mg/dL (ref 0.3–1.2)
Total Protein: 6.9 g/dL (ref 6.5–8.1)

## 2022-11-18 LAB — BLOOD GAS, ARTERIAL
Acid-base deficit: 2.8 mmol/L — ABNORMAL HIGH (ref 0.0–2.0)
Bicarbonate: 20.8 mmol/L (ref 20.0–28.0)
FIO2: 0.4 %
O2 Saturation: 98.3 %
Patient temperature: 37
pCO2 arterial: 32 mmHg (ref 32–48)
pH, Arterial: 7.42 (ref 7.35–7.45)
pO2, Arterial: 80 mmHg — ABNORMAL LOW (ref 83–108)

## 2022-11-18 LAB — D-DIMER, QUANTITATIVE: D-Dimer, Quant: 0.6 ug{FEU}/mL — ABNORMAL HIGH (ref 0.00–0.50)

## 2022-11-18 LAB — LACTIC ACID, PLASMA
Lactic Acid, Venous: 1.4 mmol/L (ref 0.5–1.9)
Lactic Acid, Venous: 1.2 mmol/L (ref 0.5–1.9)

## 2022-11-18 LAB — BRAIN NATRIURETIC PEPTIDE: B Natriuretic Peptide: 222.1 pg/mL — ABNORMAL HIGH (ref 0.0–100.0)

## 2022-11-18 MED ORDER — IPRATROPIUM-ALBUTEROL 20-100 MCG/ACT IN AERS: 2.0000 | INHALATION_SPRAY | Freq: Four times a day (QID) | RESPIRATORY_TRACT | Status: DC | PRN

## 2022-11-18 MED ORDER — METOPROLOL SUCCINATE ER 50 MG PO TB24
50.0000 mg | ORAL_TABLET | Freq: Every day | ORAL | Status: DC
  Administered 2022-11-18 – 2022-11-25 (×8): 50 mg via ORAL
  Filled 2022-11-18 (×8): qty 1

## 2022-11-18 MED ORDER — TRAZODONE HCL 50 MG PO TABS
50.0000 mg | ORAL_TABLET | Freq: Every evening | ORAL | Status: DC | PRN
  Administered 2022-11-18 – 2022-11-24 (×7): 50 mg via ORAL
  Filled 2022-11-18 (×7): qty 1

## 2022-11-18 MED ORDER — POTASSIUM CHLORIDE CRYS ER 20 MEQ PO TBCR
40.0000 meq | EXTENDED_RELEASE_TABLET | Freq: Two times a day (BID) | ORAL | Status: DC
  Administered 2022-11-18 – 2022-11-19 (×3): 40 meq via ORAL
  Filled 2022-11-18 (×3): qty 2

## 2022-11-18 MED ORDER — ALBUTEROL SULFATE (2.5 MG/3ML) 0.083% IN NEBU
2.5000 mg | INHALATION_SOLUTION | Freq: Once | RESPIRATORY_TRACT | Status: AC
  Administered 2022-11-18: 2.5 mg via RESPIRATORY_TRACT
  Filled 2022-11-18: qty 3

## 2022-11-18 MED ORDER — DILTIAZEM HCL ER COATED BEADS 120 MG PO CP24
120.0000 mg | ORAL_CAPSULE | Freq: Every day | ORAL | Status: DC
  Administered 2022-11-18 – 2022-11-19 (×2): 120 mg via ORAL
  Filled 2022-11-18 (×2): qty 1

## 2022-11-18 MED ORDER — FUROSEMIDE 40 MG PO TABS: 20.0000 mg | ORAL_TABLET | Freq: Once | ORAL | Status: DC

## 2022-11-18 MED ORDER — FUROSEMIDE 10 MG/ML IJ SOLN: 20.0000 mg | Freq: Once | INTRAMUSCULAR | Status: DC

## 2022-11-18 MED ORDER — TRAMADOL HCL 50 MG PO TABS: 50.0000 mg | ORAL_TABLET | Freq: Four times a day (QID) | ORAL | Status: DC | PRN

## 2022-11-18 MED ORDER — FLECAINIDE ACETATE 50 MG PO TABS
100.0000 mg | ORAL_TABLET | Freq: Two times a day (BID) | ORAL | Status: DC
  Administered 2022-11-18 – 2022-11-25 (×14): 100 mg via ORAL
  Filled 2022-11-18: qty 1
  Filled 2022-11-18 (×13): qty 2

## 2022-11-18 MED ORDER — PREDNISONE 20 MG PO TABS
40.0000 mg | ORAL_TABLET | Freq: Every day | ORAL | Status: DC
  Administered 2022-11-19: 40 mg via ORAL
  Filled 2022-11-18: qty 2

## 2022-11-18 MED ORDER — APIXABAN 5 MG PO TABS
5.0000 mg | ORAL_TABLET | Freq: Two times a day (BID) | ORAL | Status: DC
  Administered 2022-11-18 – 2022-11-25 (×14): 5 mg via ORAL
  Filled 2022-11-18 (×14): qty 1

## 2022-11-18 MED ORDER — FUROSEMIDE 20 MG PO TABS
20.0000 mg | ORAL_TABLET | Freq: Every day | ORAL | Status: DC
  Administered 2022-11-18 – 2022-11-19 (×2): 20 mg via ORAL
  Filled 2022-11-18 (×2): qty 1

## 2022-11-18 MED ORDER — ALUM & MAG HYDROXIDE-SIMETH 200-200-20 MG/5ML PO SUSP
30.0000 mL | ORAL | Status: DC | PRN
  Administered 2022-11-18 – 2022-11-25 (×5): 30 mL via ORAL
  Filled 2022-11-18 (×5): qty 30

## 2022-11-18 MED ORDER — DULOXETINE HCL 30 MG PO CPEP
60.0000 mg | ORAL_CAPSULE | Freq: Every day | ORAL | Status: DC
  Administered 2022-11-18 – 2022-11-25 (×8): 60 mg via ORAL
  Filled 2022-11-18 (×3): qty 2
  Filled 2022-11-18: qty 1
  Filled 2022-11-18 (×5): qty 2

## 2022-11-18 MED ORDER — LEVALBUTEROL HCL 0.63 MG/3ML IN NEBU: 0.6300 mg | INHALATION_SOLUTION | Freq: Four times a day (QID) | RESPIRATORY_TRACT | Status: DC | PRN

## 2022-11-18 MED ORDER — IPRATROPIUM-ALBUTEROL 0.5-2.5 (3) MG/3ML IN SOLN: 3.0000 mL | Freq: Four times a day (QID) | RESPIRATORY_TRACT | Status: DC | PRN

## 2022-11-18 MED ORDER — SODIUM CHLORIDE 0.9 % IV SOLN
500.0000 mg | INTRAVENOUS | Status: AC
  Administered 2022-11-18 – 2022-11-19 (×2): 500 mg via INTRAVENOUS
  Filled 2022-11-18 (×2): qty 5

## 2022-11-18 MED ORDER — MIRABEGRON ER 25 MG PO TB24
25.0000 mg | ORAL_TABLET | Freq: Every day | ORAL | Status: DC
  Administered 2022-11-18 – 2022-11-25 (×8): 25 mg via ORAL
  Filled 2022-11-18 (×8): qty 1

## 2022-11-18 MED ORDER — IPRATROPIUM-ALBUTEROL 0.5-2.5 (3) MG/3ML IN SOLN: 3.0000 mL | Freq: Four times a day (QID) | RESPIRATORY_TRACT | Status: DC

## 2022-11-18 MED ORDER — SODIUM CHLORIDE 0.9 % IV SOLN
2.0000 g | INTRAVENOUS | Status: AC
  Administered 2022-11-18 – 2022-11-22 (×5): 2 g via INTRAVENOUS
  Filled 2022-11-18 (×5): qty 20

## 2022-11-18 MED ORDER — ALPRAZOLAM 0.25 MG PO TABS
0.2500 mg | ORAL_TABLET | Freq: Once | ORAL | Status: AC
  Administered 2022-11-18: 0.25 mg via ORAL
  Filled 2022-11-18: qty 1

## 2022-11-18 MED ORDER — ROSUVASTATIN CALCIUM 10 MG PO TABS
10.0000 mg | ORAL_TABLET | Freq: Every day | ORAL | Status: DC
  Administered 2022-11-18 – 2022-11-25 (×8): 10 mg via ORAL
  Filled 2022-11-18 (×8): qty 1

## 2022-11-18 MED ORDER — METHYLPREDNISOLONE SODIUM SUCC 125 MG IJ SOLR
125.0000 mg | Freq: Two times a day (BID) | INTRAMUSCULAR | Status: AC
  Administered 2022-11-18 (×2): 125 mg via INTRAVENOUS
  Filled 2022-11-18 (×2): qty 2

## 2022-11-18 MED ORDER — ACETAMINOPHEN 325 MG PO TABS
650.0000 mg | ORAL_TABLET | Freq: Four times a day (QID) | ORAL | Status: DC | PRN
  Administered 2022-11-18: 650 mg via ORAL
  Filled 2022-11-18: qty 2

## 2022-11-18 NOTE — Progress Notes (Signed)
       CROSS COVER NOTE  NAME: Anita Harrell MRN: [id number] DOB : 12-05-1950    Concern as stated by nurse / staff   Informed by offgoing physician patietn with COVID and COPD exacerbation now requiring oxygen 50 % HHFNC     Pertinent findings on chart review: History of afib on eliquis and complian - got 1 dose of furosemide  Labs reviewed. ABG good. Ddimer 0.60; BNP 222  Assessment and  Interventions   Assessment:    11/18/2022    8:44 PM 11/18/2022    3:16 PM 11/18/2022    1:24 PM  Vitals with BMI  Height   5\' 3"   Weight   190 lbs  BMI   33.67  Systolic 126 101 97  Diastolic 64 66 55  Pulse 73 73 67   Patient reports feeling not as short of breath at the time of my exam. States she just feels exhausted  as she was unable to eat or sleep 5 days prior to admit Discussed current therapies she is on. Effects of steroids, Sat goal 88 %  And slow diuresis  Patient utilizes xanax at home on occasion for anxiety Plan: - PERC rule unable to rule out PE but WELLS score for PE 0 - No CT needed Xanax offered if needed will ask for it Transfer to progressive given heart failure and increasing oxygen need       Donnie Mesa NP [facility] Cross Cover 7pm-7am - check amion for availability Pager [phone]

## 2022-11-18 NOTE — ED Triage Notes (Addendum)
Pt via POV from home. Pt states she tested COVID+ positive on Monday, she took an at home test. Pt reports SOB, cough, weakness. Denies chest pain. States she has a hx of COPD and CHF, denies any more swelling than normal. Pt is A&Ox4 and NAD, on arrival RA sat 84%, placed on 4L [place] and pt sat 92%.

## 2022-11-18 NOTE — Assessment & Plan Note (Addendum)
Need to stop vaping

## 2022-11-18 NOTE — Assessment & Plan Note (Signed)
Recent diagnosis of COVID on September 4 Worsening respiratory status since diagnosis with noted secondary pneumonia on imaging

## 2022-11-18 NOTE — Assessment & Plan Note (Addendum)
Continue Solu-Medrol and switch over to a prednisone taper upon discharge.

## 2022-11-18 NOTE — ED Provider Notes (Signed)
[facility] Provider Note    Event Date/Time   First MD Initiated Contact with Patient 11/18/22 [phone]     (approximate)   History   Shortness of Breath and Weakness   HPI  Anita Harrell is a 72 y.o. female with a history of CHF COPD presents to the ER for evaluation of worsening shortness of breath.  She took a home COVID test which was positive a few days ago.  Followed up with PCP to confirm test and was told that she would not be a candidate for Paxlovid due to being on anticoagulation with her history of CHF.  She been try to manage at home but getting worse.  Does not wear home oxygen found to be hypoxic to 82% on room air requiring supplemental oxygen.     Physical Exam   Triage Vital Signs: ED Triage Vitals  Encounter Vitals Group     BP 11/18/22 0740 109/63     Systolic BP Percentile --      Diastolic BP Percentile --      Pulse Rate 11/18/22 0740 76     Resp 11/18/22 0740 20     Temp 11/18/22 0742 97.8 F (36.6 C)     Temp Source 11/18/22 0740 Oral     SpO2 11/18/22 0740 (!) 84 %     Weight --      Height --      Head Circumference --      Peak Flow --      Pain Score 11/18/22 0739 0     Pain Loc --      Pain Education --      Exclude from Growth Chart --     Most recent vital signs: Vitals:   11/18/22 0742 11/18/22 0745  BP:    Pulse:    Resp:    Temp: 97.8 F (36.6 C)   SpO2:  92%     Constitutional: Alert  Eyes: Conjunctivae are normal.  Head: Atraumatic. Nose: No congestion/rhinnorhea. Mouth/Throat: Mucous membranes are moist.   Neck: Painless ROM.  Cardiovascular:   Good peripheral circulation. Respiratory: Normal respiratory effort.  No retractions.  Gastrointestinal: Soft and nontender.  Musculoskeletal:  no deformity Neurologic:  MAE spontaneously. No gross focal neurologic deficits are appreciated.  Skin:  Skin is warm, dry and intact. No rash noted. Psychiatric: Mood and affect are normal. Speech and  behavior are normal.    ED Results / Procedures / Treatments   Labs (all labs ordered are listed, but only abnormal results are displayed) Labs Reviewed  COMPREHENSIVE METABOLIC PANEL - Abnormal; Notable for the following components:      Result Value   Sodium 134 (*)    Potassium 2.9 (*)    CO2 21 (*)    Glucose, Bld 138 (*)    Calcium 8.6 (*)    Albumin 3.1 (*)    All other components within normal limits  CBC WITH DIFFERENTIAL/PLATELET - Abnormal; Notable for the following components:   WBC 15.7 (*)    Platelets 97 (*)    Neutro Abs 9.7 (*)    Monocytes Absolute 5.0 (*)    Abs Immature Granulocytes 0.33 (*)    All other components within normal limits  CULTURE, BLOOD (ROUTINE X 2)  CULTURE, BLOOD (ROUTINE X 2)  LACTIC ACID, PLASMA  LACTIC ACID, PLASMA  BRAIN NATRIURETIC PEPTIDE     EKG  ED ECG REPORT I, Willy Eddy, the attending physician, personally viewed  and interpreted this ECG.   Date: 11/18/2022  EKG Time: 7:51  Rate: 70  Rhythm: sinus  Axis: normal  Intervals: normal  ST&T Change: nonspecific st abn, no stemi    RADIOLOGY Please see ED Course for my review and interpretation.  I personally reviewed all radiographic images ordered to evaluate for the above acute complaints and reviewed radiology reports and findings.  These findings were personally discussed with the patient.  Please see medical record for radiology report.    PROCEDURES:  Critical Care performed: Yes, see critical care procedure note(s)  .Critical Care  Performed by: Willy Eddy, MD Authorized by: Willy Eddy, MD   Critical care provider statement:    Critical care time (minutes):  35   Critical care was necessary to treat or prevent imminent or life-threatening deterioration of the following conditions:  Respiratory failure   Critical care was time spent personally by me on the following activities:  Ordering and performing treatments and interventions,  ordering and review of laboratory studies, ordering and review of radiographic studies, pulse oximetry, re-evaluation of patient's condition, review of old charts, obtaining history from patient or surrogate, examination of patient, evaluation of patient's response to treatment, discussions with primary provider, discussions with consultants and development of treatment plan with patient or surrogate    MEDICATIONS ORDERED IN ED: Medications  cefTRIAXone (ROCEPHIN) 2 g in sodium chloride 0.9 % 100 mL IVPB (has no administration in time range)  azithromycin (ZITHROMAX) 500 mg in sodium chloride 0.9 % 250 mL IVPB (has no administration in time range)  albuterol (PROVENTIL) (2.5 MG/3ML) 0.083% nebulizer solution 2.5 mg (2.5 mg Nebulization Given 11/18/22 0836)     IMPRESSION / MDM / ASSESSMENT AND PLAN / ED COURSE  I reviewed the triage vital signs and the nursing notes.                              Differential diagnosis includes, but is not limited to, Asthma, copd, CHF, pna, ptx, malignancy, Pe, anemia  Patient presenting to the ER for evaluation of symptoms as described above.  Based on symptoms, risk factors and considered above differential, this presenting complaint could reflect a potentially life-threatening illness therefore the patient will be placed on continuous pulse oximetry and telemetry for monitoring.  Laboratory evaluation will be sent to evaluate for the above complaints.      Clinical Course as of 11/18/22 1610  Sat Nov 18, 2022  0813 X-ray on my review and interpretation with evidence of pna no effusion.  Will await formal radiology report. [PR]  [phone] Patient with leukocytosis findings consistent with pneumonia will add on broad-spectrum IV antibiotics.  Given acute respiratory failure with hypoxia requiring supplemental oxygen will consult hospitalist for admission [PR]    Clinical Course User Index [PR] Willy Eddy, MD     FINAL CLINICAL IMPRESSION(S) / ED  DIAGNOSES   Final diagnoses:  Bronchopneumonia  COVID-19  Acute respiratory failure with hypoxia (HCC)     Rx / DC Orders   ED Discharge Orders     None        Note:  This document was prepared using Dragon voice recognition software and may include unintentional dictation errors.    Willy Eddy, MD 11/18/22 [phone]

## 2022-11-18 NOTE — Assessment & Plan Note (Signed)
-   Continue Toprol, spironolactone

## 2022-11-18 NOTE — Assessment & Plan Note (Signed)
Continue statin. 

## 2022-11-18 NOTE — Progress Notes (Signed)
Positive mild to moderate increased work of breathing persisting since admission Discussed with respiratory who would like to give patient trial of heated high flow nasal cannula Will check ABG x 1 D-dimer x 1 Monitor for now Low threshold for BiPAP if respiratory status fails to improve

## 2022-11-18 NOTE — Assessment & Plan Note (Deleted)
The patient does not wear oxygen at home.  Was on heated high flow nasal cannula earlier in the hospital course.  Now down to 1 L.  Try to taper off oxygen on a daily basis.

## 2022-11-18 NOTE — H&P (Addendum)
History and Physical    Patient: Anita Harrell:[id number] DOB: [birth year] DOA: 11/18/2022 DOS: the patient was seen and examined on 11/18/2022 PCP: Anita Pimple, MD  Patient coming from: Home  Chief Complaint:  Chief Complaint  Patient presents with   Shortness of Breath   Weakness   HPI: Anita Harrell is a 72 y.o. female with medical history significant of atrial fibrillation, HFpEF, COPD, hypertension, hyperlipidemia presenting with acute respiratory failure with hypoxia, pneumonia, COPD and CHF exacerbations.  Patient reports recently being diagnosed with COVID roughly 5 days ago.  Patient states she was not giving any treatment because of her anticoagulation and antiarrhythmic medications.  Positive symptomatic management.  Patient ports worsening respiratory status over the past 3 to 4 days.  Positive generalized malaise.  Positive cough, wheezing, increased sputum production.  Minimal chest pain.  No abdominal pain.  Positive decreased p.o. intake.  No focal hemiparesis or confusion.  No longer smoking but does vape per patient.  No reported alcohol use.  Trace orthopnea and PND. Presented to the ER afebrile, hemodynamically stable.  Satting in the mid 80s on room air, transition to 4 L nasal cannula to keep O2 sats greater than 90%.  White count 15.7, hemoglobin 13.4, platelets 97.  Creatinine 0.65, potassium 2.9.  Chest x-ray with multilobar bilateral bronchopneumonia.  Small pleural effusion and mild cardiomegaly.  EKG atrial flutter with rate in 70s. Review of Systems: As mentioned in the history of present illness. All other systems reviewed and are negative. Past Medical History:  Diagnosis Date   Allergy    allergic rhinitis   Anxiety    Arrhythmia    atrial fibrillation   Back pain    Chest pain    CHF (congestive heart failure) (HCC)    COPD (chronic obstructive pulmonary disease) (HCC)    Depression    Epigastric pain    Goiter    History of tobacco abuse     Hyperglycemia    mild   Hyperlipidemia    Hypertension    Insomnia    Hx of   Labile blood pressure    Panic disorder    History of   Personal history of colonic polyps 06/09/2004   SVD (spontaneous vaginal delivery)    x 2   Past Surgical History:  Procedure Laterality Date   ABDOMINAL HYSTERECTOMY N/A 09/24/2012   Procedure: HYSTERECTOMY ABDOMINAL;  Surgeon: Anita Aschoff, MD;  Location: [facility];  Service: Gynecology;  Laterality: N/A;   BREAST BIOPSY Right 2016   CARDIOVERSION N/A 02/09/2022   Procedure: CARDIOVERSION;  Surgeon: Anita Iba, MD;  Location: [facility];  Service: Cardiovascular;  Laterality: N/A;   CARDIOVERSION N/A 03/02/2022   Procedure: CARDIOVERSION;  Surgeon: Anita Iba, MD;  Location: [facility];  Service: Cardiovascular;  Laterality: N/A;   COLONOSCOPY     COLONOSCOPY  2017   ELBOW SURGERY  2004   EYE SURGERY     bilateral lasik   SALPINGOOPHORECTOMY Bilateral 09/24/2012   Procedure: SALPINGO OOPHORECTOMY;  Surgeon: Anita Aschoff, MD;  Location: [facility];  Service: Gynecology;  Laterality: Bilateral;   THYROID SURGERY     B9 massess   WART FULGURATION N/A 09/24/2012   Procedure: FULGURATION VAGINAL WART;  Surgeon: Anita Aschoff, MD;  Location: [facility];  Service: Gynecology;  Laterality: N/A;   WISDOM TOOTH EXTRACTION     Social History:  reports that she quit smoking about 13 years ago. Her smoking use included e-cigarettes and cigarettes.  She has never used smokeless tobacco. She reports that she does not drink alcohol and does not use drugs.  Allergies  Allergen Reactions   Abilify [Aripiprazole]     Vision problem   Ambien [Zolpidem]     Hallucinations Ms change    Codeine Nausea And Vomiting    REACTION: Nausea and vomiting Pt has tolerated vicodin & percocet in the past   Cymbalta [Duloxetine Hcl]     More depressed/tearful    Lipitor [Atorvastatin]     Muscle pain    Vesicare [Solifenacin]     MS change   Wellbutrin [Bupropion]      Family History  Problem Relation Age of Onset   Hypertension Mother    Stroke Mother    Alcohol abuse Father    Cancer Father        lung CA   Heart disease Father        CHF   Cancer Sister        breast CA   Heart disease Sister        CHF from chemotx also has defib   Breast cancer Sister    Colon cancer Neg Hx    Esophageal cancer Neg Hx    Rectal cancer Neg Hx    Stomach cancer Neg Hx     Prior to Admission medications   Medication Sig Start Date End Date Taking? Authorizing Provider  acetaminophen (TYLENOL) 500 MG tablet Take 500 mg by mouth every 6 (six) hours as needed.    [provider]  albuterol (VENTOLIN HFA) 108 (90 Base) MCG/ACT inhaler Inhale 2 puffs into the lungs every 6 (six) hours as needed for wheezing or shortness of breath. 02/15/22   Anita Highland, MD  ALPRAZolam Prudy Feeler) 0.25 MG tablet Take by mouth. 07/19/22   [provider]  cyclobenzaprine (FLEXERIL) 10 MG tablet TAKE ONE HALF (1/2) TO ONE TABLET BY MOUTH AT BEDTIME AS NEEDED FOR MUSCLE SPASMS Patient taking differently: Take 10 mg by mouth 3 (three) times daily as needed for muscle spasms. 08/08/22   Harrell, Anita Gallus, MD  diltiazem (CARDIZEM CD) 120 MG 24 hr capsule Take 1 capsule (120 mg total) by mouth daily. 02/21/22 06/20/25  Anita Barges, PA-C  DULoxetine (CYMBALTA) 60 MG capsule Take 60 mg by mouth daily. 04/11/22   [provider]  ELIQUIS 5 MG TABS tablet TAKE ONE TABLET BY MOUTH TWICE A DAY 09/11/22   Anita Iba, MD  flecainide (TAMBOCOR) 100 MG tablet Take 1 tablet (100 mg total) by mouth 2 (two) times daily. 12/12/21   Anita Iba, MD  Fluticasone-Umeclidin-Vilant (TRELEGY ELLIPTA) 100-62.5-25 MCG/ACT AEPB Inhale 1 puff into the lungs daily. 02/28/22   Anita Saner, MD  furosemide (LASIX) 20 MG tablet Take 1 tablet (20 mg total) by mouth daily. 02/24/22 08/01/23  Anita Barges, PA-C  gabapentin (NEURONTIN) 300 MG capsule Take by mouth. 10/25/22    [provider]  hydrOXYzine (VISTARIL) 25 MG capsule Take 25 mg by mouth 2 (two) times daily. 04/11/22   Harrell, Teah, NP  Melatonin 5 MG CAPS Take by mouth.    [provider]  metoprolol succinate (TOPROL-XL) 50 MG 24 hr tablet TAKE ONE TABLET BY MOUTH TWICE A DAY WITH MEALS 10/25/22   Anita Iba, MD  mirabegron ER (MYRBETRIQ) 25 MG TB24 tablet Take 1 tablet (25 mg total) by mouth daily. 05/26/22   Harrell, Anita Gallus, MD  PARoxetine (PAXIL) 10 MG tablet Take  1 tablet by mouth daily.    Harrell, Teah, NP  potassium chloride (KLOR-CON) 10 MEQ tablet Take 3 tablets (30 mEq total) by mouth daily. 06/08/22   Delma Freeze, FNP  rosuvastatin (CRESTOR) 10 MG tablet TAKE 1 TABLET BY MOUTH ONCE A DAY 04/06/22   Harrell, Anita Gallus, MD  traMADol (ULTRAM) 50 MG tablet TAKE ONE (1) TO TWO (2) TABLETS BY MOUTH EVERY EIGHT HOURS AS NEEDED FOR MODERATE PAIN 11/10/22   Harrell, Anita Gallus, MD  traZODone (DESYREL) 50 MG tablet Take 50 mg by mouth at bedtime as needed. 06/29/22   [provider]    Physical Exam: Vitals:   11/18/22 0740 11/18/22 0742 11/18/22 0745  BP: 109/63    Pulse: 76    Resp: 20    Temp:  97.8 F (36.6 C)   TempSrc: Oral Oral   SpO2: (!) 84%  92%   Physical Exam Constitutional:      Appearance: She is normal weight.  HENT:     Head: Normocephalic and atraumatic.     Mouth/Throat:     Mouth: Mucous membranes are moist.  Cardiovascular:     Rate and Rhythm: Normal rate and regular rhythm.  Pulmonary:     Effort: Pulmonary effort is normal.     Breath sounds: Wheezing and rales present.  Abdominal:     General: Abdomen is flat.  Musculoskeletal:        General: Normal range of motion.     Cervical back: Normal range of motion.  Skin:    General: Skin is warm.  Neurological:     General: No focal deficit present.  Psychiatric:        Mood and Affect: Mood normal.     Data Reviewed:  There are no new results to review at this time. DG  Chest Port 1 View CLINICAL DATA:  72 year old female with history of positive COVID test on Monday. Shortness of breath, cough and weakness.  EXAM: PORTABLE CHEST 1 VIEW  COMPARISON:  Chest x-ray 02/13/2022.  FINDINGS: Widespread areas of interstitial prominence and peribronchial cuffing, along with patchy ill-defined opacities noted in the lungs bilaterally, most evident in the mid to lower lungs. Small left pleural effusion. No definite right pleural effusion. No pneumothorax. Pulmonary vasculature does not appear engorged. Heart size is mildly enlarged. Upper mediastinal contours are within normal limits.  IMPRESSION: 1. The appearance of the chest suggests multilobar bilateral bronchopneumonia (left-greater-than-right), as above. 2. Small left pleural effusion. 3. Mild cardiomegaly.  Electronically Signed   By: Trudie Reed M.D.   On: 11/18/2022 08:20  Lab Results  Component Value Date   WBC 15.7 (H) 11/18/2022   HGB 13.4 11/18/2022   HCT 39.2 11/18/2022   MCV 84.5 11/18/2022   PLT 97 (L) 11/18/2022   Last metabolic panel Lab Results  Component Value Date   GLUCOSE 138 (H) 11/18/2022   NA 134 (L) 11/18/2022   K 2.9 (L) 11/18/2022   CL 102 11/18/2022   CO2 21 (L) 11/18/2022   BUN 11 11/18/2022   CREATININE 0.65 11/18/2022   GFRNONAA >60 11/18/2022   CALCIUM 8.6 (L) 11/18/2022   PHOS 2.8 03/29/2010   PROT 6.9 11/18/2022   ALBUMIN 3.1 (L) 11/18/2022   BILITOT 0.4 11/18/2022   ALKPHOS 93 11/18/2022   AST 30 11/18/2022   ALT 21 11/18/2022   ANIONGAP 11 11/18/2022    Assessment and Plan: Acute respiratory failure with hypoxia (HCC) Decompensated respiratory failure now requiring 4 L  nasal cannula with noted bronchopneumonia on chest x-ray as well as overlapping COPD exacerbation and mild cardiomegaly in setting of HFpEF though symptoms seem predominantly more pulmonary than cardiac IV Rocephin azithromycin for infectious coverage IV Solu-Medrol and  DuoNebs Continue supplemental oxygen Positive cardiomegaly on chest x-ray Gentle diuresis Monitor  Bronchopneumonia Positive multilobar pneumonia on imaging in the setting of recent COVID diagnosis roughly 5 days ago IV Rocephin and azithromycin for infectious coverage Blood and respiratory cultures Urine strep Legionella Respiratory panel Continue supplemental oxygen as needed Monitor  COPD with acute exacerbation (HCC) Positive exacerbation in the setting of multilobar pneumonia with decompensated respiratory status now requiring 4 L nasal cannula IV Solu-Medrol DuoNebs IV Rocephin azithromycin in the setting of pneumonia coverage Supplemental oxygen as needed Monitor  (HFpEF) heart failure with preserved ejection fraction (HCC) 2D echo December 2023 with EF 55 to 60% Appears fairly euvolemic on exam today trace volume overload in the setting of decompensated respiratory failure with pneumonia and COPD Positive cardiomegaly on imaging BNP pending Will give small dose of IV Lasix x 1 Otherwise continue cardiac and diuretic regimen  HTN (hypertension) BP stable  Titrate home regimen  Hyperlipidemia Continue statin  Thrombocytopenia (HCC) Plt in 90s  Appears to have overall downward trend over past 12 months  Will check peripheral smear  Monitor   Atrial fibrillation, chronic (HCC) Rate controlled at present Continue home medications: Diltiazem, flecainide, Eliquis Monitor  COVID-19 Recent diagnosis of COVID on September 4 Worsening respiratory status since diagnosis with noted secondary pneumonia on imaging   Former smoker Pt now vaping  Discussed cessation at length       Advance Care Planning:   Code Status: Full Code   Consults: None   Family Communication: Family at the bedside  Severity of Illness: The appropriate patient status for this patient is INPATIENT. Inpatient status is judged to be reasonable and necessary in order to provide the  required intensity of service to ensure the patient's safety. The patient's presenting symptoms, physical exam findings, and initial radiographic and laboratory data in the context of their chronic comorbidities is felt to place them at high risk for further clinical deterioration. Furthermore, it is not anticipated that the patient will be medically stable for discharge from the hospital within 2 midnights of admission.   * I certify that at the point of admission it is my clinical judgment that the patient will require inpatient hospital care spanning beyond 2 midnights from the point of admission due to high intensity of service, high risk for further deterioration and high frequency of surveillance required.*  Author: Floydene Flock, MD 11/18/2022 9:42 AM  For on call review [email or web address].

## 2022-11-18 NOTE — Assessment & Plan Note (Addendum)
Multi lobar COVID-19 pneumonia.  Patient also completed empiric Rocephin and Zithromax.  Continue Solu-Medrol and switched over to a prednisone taper.  Sputum culture actually grew out rare Staph aureus.  Will give doxycycline 100 mg twice a day for 1 week upon going home.

## 2022-11-18 NOTE — Assessment & Plan Note (Signed)
Plt in 90s  Appears to have overall downward trend over past 12 months  Will check peripheral smear  Monitor

## 2022-11-18 NOTE — Progress Notes (Addendum)
Pt ao x4. Pt on 5L of o2 on arrival to unit. Pt now currently running HFNC @ 50L and 40%. Pt o2 sats 88-92%. Pt having some SOB. MD aware, Night shift Provider notified. No new orders at this time. Pt resting in bed at this time with husband at bedside.

## 2022-11-18 NOTE — Assessment & Plan Note (Addendum)
Acute on chronic.  2D echo December 2023 with EF 50-55% Patient on IV Lasix.  Continue Toprol and spironolactone.

## 2022-11-18 NOTE — Assessment & Plan Note (Signed)
Rate controlled at present Continue home medications: Diltiazem, flecainide, Eliquis Monitor

## 2022-11-19 ENCOUNTER — Inpatient Hospital Stay: Payer: Medicare Other

## 2022-11-19 DIAGNOSIS — J441 Chronic obstructive pulmonary disease with (acute) exacerbation: Secondary | ICD-10-CM

## 2022-11-19 DIAGNOSIS — J9601 Acute respiratory failure with hypoxia: Secondary | ICD-10-CM | POA: Diagnosis not present

## 2022-11-19 DIAGNOSIS — E871 Hypo-osmolality and hyponatremia: Secondary | ICD-10-CM | POA: Insufficient documentation

## 2022-11-19 DIAGNOSIS — U071 COVID-19: Secondary | ICD-10-CM

## 2022-11-19 DIAGNOSIS — J180 Bronchopneumonia, unspecified organism: Secondary | ICD-10-CM

## 2022-11-19 DIAGNOSIS — E872 Acidosis, unspecified: Secondary | ICD-10-CM | POA: Insufficient documentation

## 2022-11-19 LAB — PHOSPHORUS: Phosphorus: 2.3 mg/dL — ABNORMAL LOW (ref 2.5–4.6)

## 2022-11-19 LAB — BASIC METABOLIC PANEL
Anion gap: 8 (ref 5–15)
BUN: 17 mg/dL (ref 8–23)
CO2: 20 mmol/L — ABNORMAL LOW (ref 22–32)
Calcium: 8.7 mg/dL — ABNORMAL LOW (ref 8.9–10.3)
Chloride: 107 mmol/L (ref 98–111)
Creatinine, Ser: 0.55 mg/dL (ref 0.44–1.00)
GFR, Estimated: >60 mL/min (ref >60)
Glucose, Bld: 202 mg/dL — ABNORMAL HIGH (ref 70–99)
Potassium: 3.9 mmol/L (ref 3.5–5.1)
Sodium: 135 mmol/L (ref 135–145)

## 2022-11-19 LAB — STREP PNEUMONIAE URINARY ANTIGEN: Strep Pneumo Urinary Antigen: NEGATIVE

## 2022-11-19 LAB — MAGNESIUM: Magnesium: 2.2 mg/dL (ref 1.7–2.4)

## 2022-11-19 LAB — PROCALCITONIN: Procalcitonin: 0.19 ng/mL

## 2022-11-19 MED ORDER — IOHEXOL 350 MG/ML SOLN
75.0000 mL | Freq: Once | INTRAVENOUS | Status: AC | PRN
  Administered 2022-11-19: 75 mL via INTRAVENOUS

## 2022-11-19 MED ORDER — FUROSEMIDE 10 MG/ML IJ SOLN
40.0000 mg | Freq: Every day | INTRAMUSCULAR | Status: DC
  Administered 2022-11-19 – 2022-11-20 (×2): 40 mg via INTRAVENOUS
  Filled 2022-11-19 (×2): qty 4

## 2022-11-19 MED ORDER — IPRATROPIUM-ALBUTEROL 20-100 MCG/ACT IN AERS
1.0000 | INHALATION_SPRAY | Freq: Four times a day (QID) | RESPIRATORY_TRACT | Status: DC
  Administered 2022-11-19 – 2022-11-25 (×25): 1 via RESPIRATORY_TRACT
  Filled 2022-11-19: qty 4

## 2022-11-19 MED ORDER — UMECLIDINIUM BROMIDE 62.5 MCG/ACT IN AEPB
1.0000 | INHALATION_SPRAY | Freq: Every day | RESPIRATORY_TRACT | Status: DC
  Administered 2022-11-19 – 2022-11-25 (×7): 1 via RESPIRATORY_TRACT
  Filled 2022-11-19: qty 7

## 2022-11-19 MED ORDER — AZITHROMYCIN 250 MG PO TABS
500.0000 mg | ORAL_TABLET | Freq: Every day | ORAL | Status: AC
  Administered 2022-11-20 – 2022-11-22 (×3): 500 mg via ORAL
  Filled 2022-11-19 (×3): qty 2

## 2022-11-19 MED ORDER — METHYLPREDNISOLONE SODIUM SUCC 125 MG IJ SOLR
80.0000 mg | Freq: Every day | INTRAMUSCULAR | Status: DC
  Administered 2022-11-19 – 2022-11-24 (×6): 80 mg via INTRAVENOUS
  Filled 2022-11-19 (×6): qty 2

## 2022-11-19 MED ORDER — SODIUM BICARBONATE 650 MG PO TABS
650.0000 mg | ORAL_TABLET | Freq: Two times a day (BID) | ORAL | Status: DC
  Administered 2022-11-19 (×2): 650 mg via ORAL
  Filled 2022-11-19 (×2): qty 1

## 2022-11-19 MED ORDER — POTASSIUM & SODIUM PHOSPHATES 280-160-250 MG PO PACK
1.0000 | PACK | Freq: Three times a day (TID) | ORAL | Status: AC
  Administered 2022-11-19 (×3): 1 via ORAL
  Filled 2022-11-19 (×3): qty 1

## 2022-11-19 MED ORDER — FLUTICASONE FUROATE-VILANTEROL 100-25 MCG/ACT IN AEPB
1.0000 | INHALATION_SPRAY | Freq: Every day | RESPIRATORY_TRACT | Status: DC
  Administered 2022-11-19 – 2022-11-25 (×7): 1 via RESPIRATORY_TRACT
  Filled 2022-11-19: qty 28

## 2022-11-19 NOTE — Hospital Course (Addendum)
Anita Harrell is a 72 y.o. female with medical history significant of atrial fibrillation, HFpEF, COPD, hypertension, hyperlipidemia presenting with acute respiratory failure with hypoxia, pneumonia, COPD and CHF exacerbations.   Patient had an increased cough, shortness of breath and wheezing. Upon arriving the hospital, patient had hypoxemia of 80% on room air, initially put on 2 L oxygen, then changed to heated high flow. Patient was found to have positive COVID, chest x-ray showed multifocal bronchopneumonia. Patient is started on IV steroids, Rocephin and Zithromax.  Patient was also started on Lasix.  9/11.  Patient started down to 4 L nasal cannula today. 9/12.  Patient was down to 3 L when I saw her this morning and will try to taper to off today. 9/13.  Patient down to 1 L of oxygen today.  Try to take off oxygen if able to. 9/14.  Patient's pulse ox dropped down to 84% with ambulation.  Patient feeling well enough to go home and will go home with oxygen.

## 2022-11-19 NOTE — Progress Notes (Signed)
Transition of Care [facility]) - Inpatient Brief Assessment   Patient Details  Name: Anita Harrell MRN: [id number] Date of Birth: [birth year]  Transition of Care [facility]) CM/SW Contact:    Bing Quarry, RN Phone Number: 11/19/2022, 1:04 PM   Clinical Narrative: 11/19/22: New to unit. Tested C+ last Monday with home test. From home.  Symptoms worsening. To ED 11/18/22 w/dx of acute respiratory failure with hypoxia, pneumonia, COPD & CHF exacerbations. PMH: significant for afib, HFpEF, COPD, HTN, HLD. No home oxygen PTA. O2 Sats in ED in the 80's. IV antibiotics changing to oral today. This am showed HFNC 50L. Brief assessment done.  PCP: Roxy Manns at [facility].  INS: Medicare A/B.  HRA Score: 17%. TOC to follow for dc needs and a HF referral.   Gabriel Cirri MSN RN CM  Transitions of Care Department [facility] [phone] Weekends Only     Transition of Care Asessment: Insurance and Status: Insurance coverage has been reviewed Patient has primary care physician: Yes Home environment has been reviewed: From home setting to ED[place] feeling [place]. Prior level of function:: Independent Prior/Current Home Services: No current home services Social Determinants of Health Reivew: SDOH reviewed no interventions necessary Readmission risk has been reviewed: Yes Transition of care needs: transition of care needs identified, TOC will continue to follow (Follow for HF/or post discharge needs: HH or DME oxygen)

## 2022-11-19 NOTE — Progress Notes (Addendum)
Progress Note   Patient: Anita Harrell:[id number] DOB: 09-Oct-1950 DOA: 11/18/2022     1 DOS: the patient was seen and examined on 11/19/2022   Brief hospital course: Anita Harrell is a 72 y.o. female with medical history significant of atrial fibrillation, HFpEF, COPD, hypertension, hyperlipidemia presenting with acute respiratory failure with hypoxia, pneumonia, COPD and CHF exacerbations.   Patient had an increased cough, shortness of breath and wheezing. Upon arriving the hospital, patient had hypoxemia of 80% on room air, initially put on 2 L oxygen, then changed to heated high flow. Patient was found to have positive COVID, chest x-ray showed multifocal bronchopneumonia. Patient is started on IV steroids, Rocephin and Zithromax.  Patient was also started on Lasix.   Active Problems:   Acute respiratory failure with hypoxia (HCC)   Bronchopneumonia   COPD with acute exacerbation (HCC)   HTN (hypertension)   (HFpEF) heart failure with preserved ejection fraction (HCC)   Hyperlipidemia   Hypokalemia   Obesity (BMI 30-39.9)   Former smoker   COVID-19   Atrial fibrillation, chronic (HCC)   Thrombocytopenia (HCC)   Hyponatremia   Hypophosphatemia   Metabolic acidosis   Assessment and Plan: Acute respiratory failure with hypoxia (HCC) COVID-pneumonia. Secondary bacterial pneumonia. Acute respite failure appears to be multifactorial, including pneumonia, COPD exacerbation, exacerbation of diastolic congestive heart failure. Patient currently on heated high flow,  high risk of deterioration.  Will monitor closely in the progressive unit. Patient does not need remdesivir as patient COVID was diagnosed 5 days ago.  Due to severe respiratory failure, patient will be given IV steroids. Will check a CRP to see if additional treatment is needed. D dimer 0.6, not elevated based on age.  Continue antibiotics with Rocephin and Zithromax.  Pending procalcitonin level.  COPD with  acute exacerbation [facility]) Patient had some bronchospasm at the time of admission, continue antibiotics and steroids.  Acute on chronic (HFpEF) heart failure with preserved ejection fraction [facility]) 2D echo December 2023 with EF 55 to 60% Patient had some orthopnea, some crackles in the base.  May have mild CHF exacerbation.  Given severe acute respiratory failure, patient will need a drive up to long further.  I will start IV Lasix, starting with 40 mg.  Hypokalemia Hyponatremia. Metabolic acidosis. Hypophosphatemia. Potassium has normalized, started oral sodium bicarb for CO2 of 20.  Phosphate 2.3, repleted orally.  HTN (hypertension) BP stable    Hyperlipidemia Continue statin  Thrombocytopenia (HCC) Appears to be chronic, worsening due to COVID.  Continue to follow.  No active bleeding.  Atrial fibrillation, chronic (HCC) Rate controlled at present  On flecainide, Eliquis Patient has bradycardia, discontinued diltiazem.  Tobacco abuse with vaping. Advised to quit.    Subjective:  Patient currently on heated high flow, still have a cough, short of breath with exertion.  Physical Exam: Vitals:   11/19/22 0317 11/19/22 0358 11/19/22 0800 11/19/22 0825  BP: (!) 111/56 101/63 127/63   Pulse: 63 60 70 69  Resp: (!) 41 14 (!) 28 (!) 21  Temp: 97.7 F (36.5 C) (!) 97.3 F (36.3 C) (!) 97.5 F (36.4 C)   TempSrc: Oral  Oral   SpO2: (!) 87% 92% (!) 87% 92%  Weight:      Height:       General exam: Appears calm and comfortable  Respiratory system: A few crackles in the base.Marland Kitchen Respiratory effort normal. Cardiovascular system: Bradycardic. No JVD, murmurs, rubs, gallops or clicks. No pedal edema. Gastrointestinal system:  Abdomen is nondistended, soft and nontender. No organomegaly or masses felt. Normal bowel sounds heard. Central nervous system: Alert and oriented. No focal neurological deficits. Extremities: Symmetric 5 x 5 power. Skin: No rashes, lesions or  ulcers Psychiatry: Judgement and insight appear normal. Mood & affect appropriate.    Data Reviewed:  Chest x-ray results and images reviewed, lab results reviewed.  Family Communication: Husband updated at bedside.  Disposition: Status is: Inpatient Remains inpatient appropriate because: Severity of disease, IV treatment, high risk of deterioration.     Time spent: 55 minutes  Author: Marrion Coy, MD 11/19/2022 10:26 AM  For on call review [email or web address].

## 2022-11-19 NOTE — Progress Notes (Signed)
PHARMACIST - PHYSICIAN COMMUNICATION  CONCERNING: Antibiotic IV to Oral Route Change Policy  RECOMMENDATION: This patient is receiving azithromycin by the intravenous route.  Based on criteria approved by the Pharmacy and Therapeutics Committee, the antibiotic(s) is/are being converted to the equivalent oral dose form(s).  DESCRIPTION: These criteria include: Patient being treated for a respiratory tract infection, urinary tract infection, cellulitis or clostridium difficile associated diarrhea if on metronidazole The patient is not neutropenic and does not exhibit a GI malabsorption state The patient is eating (either orally or via tube) and/or has been taking other orally administered medications for a least 24 hours The patient is improving clinically and has a Tmax < 100.5  If you have questions about this conversion, please contact the Pharmacy Department   Tressie Ellis 11/19/22

## 2022-11-20 DIAGNOSIS — J9601 Acute respiratory failure with hypoxia: Secondary | ICD-10-CM | POA: Diagnosis not present

## 2022-11-20 DIAGNOSIS — U071 COVID-19: Secondary | ICD-10-CM | POA: Diagnosis not present

## 2022-11-20 DIAGNOSIS — J441 Chronic obstructive pulmonary disease with (acute) exacerbation: Secondary | ICD-10-CM | POA: Diagnosis not present

## 2022-11-20 DIAGNOSIS — J180 Bronchopneumonia, unspecified organism: Secondary | ICD-10-CM | POA: Diagnosis not present

## 2022-11-20 LAB — BASIC METABOLIC PANEL
Anion gap: 8 (ref 5–15)
BUN: 20 mg/dL (ref 8–23)
CO2: 24 mmol/L (ref 22–32)
Calcium: 8.8 mg/dL — ABNORMAL LOW (ref 8.9–10.3)
Chloride: 104 mmol/L (ref 98–111)
Creatinine, Ser: 0.55 mg/dL (ref 0.44–1.00)
GFR, Estimated: >60 mL/min (ref >60)
Glucose, Bld: 142 mg/dL — ABNORMAL HIGH (ref 70–99)
Potassium: 3.6 mmol/L (ref 3.5–5.1)
Sodium: 136 mmol/L (ref 135–145)

## 2022-11-20 LAB — CBC
HCT: 36.3 % (ref 36.0–46.0)
Hemoglobin: 12.2 g/dL (ref 12.0–15.0)
MCH: 28.6 pg (ref 26.0–34.0)
MCHC: 33.6 g/dL (ref 30.0–36.0)
MCV: 85 fL (ref 80.0–100.0)
Platelets: 141 10*3/uL — ABNORMAL LOW (ref 150–400)
RBC: 4.27 MIL/uL (ref 3.87–5.11)
RDW: 13.2 % (ref 11.5–15.5)
WBC: 19.1 10*3/uL — ABNORMAL HIGH (ref 4.0–10.5)
nRBC: 0 % (ref 0.0–0.2)

## 2022-11-20 LAB — EXPECTORATED SPUTUM ASSESSMENT W GRAM STAIN, RFLX TO RESP C

## 2022-11-20 LAB — C-REACTIVE PROTEIN: CRP: 5.9 mg/dL — ABNORMAL HIGH (ref <1.0)

## 2022-11-20 LAB — MAGNESIUM: Magnesium: 2.1 mg/dL (ref 1.7–2.4)

## 2022-11-20 LAB — PHOSPHORUS: Phosphorus: 2.4 mg/dL — ABNORMAL LOW (ref 2.5–4.6)

## 2022-11-20 MED ORDER — FUROSEMIDE 10 MG/ML IJ SOLN
40.0000 mg | Freq: Two times a day (BID) | INTRAMUSCULAR | Status: DC
  Administered 2022-11-20 – 2022-11-25 (×10): 40 mg via INTRAVENOUS
  Filled 2022-11-20 (×10): qty 4

## 2022-11-20 MED ORDER — QUETIAPINE FUMARATE 25 MG PO TABS
25.0000 mg | ORAL_TABLET | Freq: Every day | ORAL | Status: DC
  Administered 2022-11-20 – 2022-11-24 (×5): 25 mg via ORAL
  Filled 2022-11-20 (×5): qty 1

## 2022-11-20 MED ORDER — POTASSIUM & SODIUM PHOSPHATES 280-160-250 MG PO PACK
1.0000 | PACK | Freq: Three times a day (TID) | ORAL | Status: AC
  Administered 2022-11-20 – 2022-11-21 (×4): 1 via ORAL
  Filled 2022-11-20 (×4): qty 1

## 2022-11-20 MED ORDER — GUAIFENESIN-DM 100-10 MG/5ML PO SYRP
5.0000 mL | ORAL_SOLUTION | ORAL | Status: DC | PRN
  Administered 2022-11-20 – 2022-11-25 (×7): 5 mL via ORAL
  Filled 2022-11-20 (×7): qty 10

## 2022-11-20 NOTE — TOC Progression Note (Signed)
Transition of Care [facility]) - Progression Note    Patient Details  Name: Anita Harrell MRN: [id number] Date of Birth: 05-Feb-1951  Transition of Care [facility]) CM/SW Contact  Truddie Hidden, RN Phone Number: 11/20/2022, 4:19 PM  Clinical Narrative:    TOC continuing to follow patient's progress throughout discharge planning.        Expected Discharge Plan and Services                                               Social Determinants of Health (SDOH) Interventions SDOH Screenings   Food Insecurity: No Food Insecurity (11/18/2022)  Housing: Low Risk  (11/18/2022)  Transportation Needs: No Transportation Needs (11/18/2022)  Utilities: Not At Risk (11/18/2022)  Alcohol Screen: Low Risk  (05/18/2022)  Depression (PHQ2-9): Low Risk  (05/18/2022)  Recent Concern: Depression (PHQ2-9) - High Risk (05/17/2022)  Financial Resource Strain: Low Risk  (05/18/2022)  Physical Activity: Inactive (05/18/2022)  Social Connections: Moderately Integrated (05/18/2022)  Stress: Stress Concern Present (05/18/2022)  Tobacco Use: Medium Risk (11/18/2022)    Readmission Risk Interventions     No data to display

## 2022-11-20 NOTE — Progress Notes (Signed)
[facility] HF Stewardship  PCP: Tower, Audrie Gallus, MD  PCP-Cardiologist: Julien Nordmann, MD  HPI: Anita Harrell is a 72 y.o. female with atrial fibrillation, HFpEF, COPD, hypertension, hyperlipidemia who presented with acute respiratory failure with hypoxia, pneumonia, COPD & CHF exacerbations. Found to have COVID-19 PNA and possible secondary bacterial pneumonia. Treated with steroids and antibiotics. Given IV furosemide for mild hypervolemia.   Pertinent cardiac history: TTE 08/2021 showed LVEF of 55-60% with LA dilation and mild MR. Negative exercise test in 12/2021. Long term monitor in 12/2021 showed continuous Afib. TTE 02/2022 showed LVEF 50-55%.   Pertinent Lab Values: Creatinine, Ser  Date Value Ref Range Status  11/20/2022 0.55 0.44 - 1.00 mg/dL Final   BUN  Date Value Ref Range Status  11/20/2022 20 8 - 23 mg/dL Final   Potassium  Date Value Ref Range Status  11/20/2022 3.6 3.5 - 5.1 mmol/L Final   Sodium  Date Value Ref Range Status  11/20/2022 136 135 - 145 mmol/L Final   B Natriuretic Peptide  Date Value Ref Range Status  11/18/2022 222.1 (H) 0.0 - 100.0 pg/mL Final    Comment:    Performed at [facility], [address] [place], [place] [address]   Magnesium  Date Value Ref Range Status  11/20/2022 2.1 1.7 - 2.4 mg/dL Final    Comment:    Performed at [facility], [address] [place], [place] [address]   Hgb A1c MFr Bld  Date Value Ref Range Status  06/07/2021 6.1 4.6 - 6.5 % Final    Comment:    Glycemic Control Guidelines for People with Diabetes:Non Diabetic:  <6%Goal of Therapy: <7%Additional Action Suggested:  >8%    TSH  Date Value Ref Range Status  06/07/2021 2.14 0.35 - 5.50 uIU/mL Final    Vital Signs: Temp:  [97.6 F (36.4 C)-98.3 F (36.8 C)] 97.9 F (36.6 C) (09/09 0741) Pulse Rate:  [63-73] 73 (09/09 0000) Cardiac Rhythm: Bundle branch block (09/08 2100) Resp:  [21-43] 28 (09/09 0000) BP: (101-128)/(50-91)  128/85 (09/09 0741) SpO2:  [88 %-94 %] 94 % (09/09 0741) FiO2 (%):  [50 %] 50 % (09/08 2024)   Intake/Output Summary (Last 24 hours) at 11/20/2022 0849 Last data filed at 11/20/2022 0500 Gross per 24 hour  Intake 225 ml  Output 1575 ml  Net -1350 ml    Current Inpatient Medications:  -Furosemide 40 mg IV BID -Metoprolol succinate 50 mg daily  Prior to admission HF Medications:  -Furosemide 20 mg daily + potassium 30 meq daily -Metoprolol succinate 50 mg daily  Assessment: 1. Diastolic heart failure (LVEF 50-55%), due to unknown etiology. NYHA class IV symptoms.  -Worsening SOB requiring high O2. CT to rule out PE. Symptoms most likely due to PNA with CHF and COPD somewhat contributing. Creatinine stable and furosemide increased to BID.  -BP stable. Would forgoe GDMT titration until infection is resolved and at low risk for intubation. -Maintaining NSR on flecainide. HR 70s. Cautioned use in cardiomyopathy, however does not appear to be ischemic in nature.  Plan: 1) Medication changes recommended at this time: -None  2) Patient assistance: -Pending  3) Education: -To be completed prior to discharge.   Medication Assistance / Insurance Benefits Check:  Does the patient have prescription insurance?    Type of insurance plan:   Does the patient qualify for medication assistance through manufacturers or grants? Pending   Eligible grants and/or patient assistance programs: pending   Medication assistance applications in  progress: pending   Medication assistance applications approved: pending  Outpatient Pharmacy:  Prior to admission outpatient pharmacy: [facility]     Thank you for involving pharmacy in this patient's care.  Enos Fling, PharmD, BCPS Phone - [phone] Clinical Pharmacist 11/20/2022 12:10 PM

## 2022-11-20 NOTE — Plan of Care (Signed)
  Problem: Activity: Goal: Capacity to carry out activities will improve Outcome: Progressing   Problem: Activity: Goal: Ability to tolerate increased activity will improve Outcome: Progressing   Problem: Respiratory: Goal: Ability to maintain a clear airway will improve Outcome: Progressing Goal: Levels of oxygenation will improve Outcome: Progressing Goal: Ability to maintain adequate ventilation will improve Outcome: Progressing

## 2022-11-20 NOTE — Progress Notes (Signed)
Progress Note   Patient: Anita Harrell:[id number] DOB: [birth year] DOA: 11/18/2022     2 DOS: the patient was seen and examined on 11/20/2022   Brief hospital course: Anita Harrell is a 72 y.o. female with medical history significant of atrial fibrillation, HFpEF, COPD, hypertension, hyperlipidemia presenting with acute respiratory failure with hypoxia, pneumonia, COPD and CHF exacerbations.   Patient had an increased cough, shortness of breath and wheezing. Upon arriving the hospital, patient had hypoxemia of 80% on room air, initially put on 2 L oxygen, then changed to heated high flow. Patient was found to have positive COVID, chest x-ray showed multifocal bronchopneumonia. Patient is started on IV steroids, Rocephin and Zithromax.  Patient was also started on Lasix.   Active Problems:   Acute respiratory failure with hypoxia (HCC)   Bronchopneumonia   COPD with acute exacerbation (HCC)   HTN (hypertension)   (HFpEF) heart failure with preserved ejection fraction (HCC)   Hyperlipidemia   Hypokalemia   Obesity (BMI 30-39.9)   Former smoker   COVID-19   Atrial fibrillation, chronic (HCC)   Thrombocytopenia (HCC)   Hyponatremia   Hypophosphatemia   Metabolic acidosis   Assessment and Plan:  Acute respiratory failure with hypoxia (HCC) COVID-pneumonia. Secondary bacterial pneumonia. Acute respite failure appears to be multifactorial, including pneumonia, COPD exacerbation, exacerbation of diastolic congestive heart failure. Patient currently on heated high flow,  high risk of deterioration.  Will monitor closely in the progressive unit. Patient does not need remdesivir as patient COVID was diagnosed 5 days ago.  Due to severe respiratory failure, patient will be given IV steroids. Continue antibiotics with Rocephin and Zithromax.  Calcitonin level only minimal elevated. CRP level 5.9, repeat level tomorrow.  CT chest with contrast rule out PE.  But the patient does have  significant chronic last changes consistent with COVID-pneumonia. Will consider Actemra if his CRP level continues to increase.  Right lower lobe lung mass. CT scan also identified a 2.8 cm lung mass in the right lower lobe, patient states that she has been followed by pulmonology for this.  COPD with acute exacerbation [facility]) Patient had some bronchospasm at the time of admission, continue antibiotics and steroids.   Acute on chronic (HFpEF) heart failure with preserved ejection fraction [facility]) 2D echo December 2023 with EF 55 to 60% Patient had some orthopnea, some crackles in the base.  May have mild CHF exacerbation.  Given severe acute respiratory failure, patient will need to dry up to lungs further.   Increase furosemide to 40 mg twice a day.  Continue to follow.   Hypokalemia Hyponatremia. Metabolic acidosis. Hypophosphatemia. Condition all improved.  Given another treatment of oral phosphate.   HTN (hypertension) BP stable      Hyperlipidemia Continue statin   Thrombocytopenia (HCC) Appears to be chronic, worsening due to COVID.  Continue to follow.  No active bleeding.   Atrial fibrillation, chronic (HCC) Rate controlled at present  On flecainide, Eliquis Patient has bradycardia, discontinued diltiazem.   Tobacco abuse with vaping. Advised to quit.       Subjective:  Patient short of breath seem to be stable, but had an increased oxygen to 50% last night, was able to bring it down to 44%. Patient could not sleep last night not due to short of breath.  Physical Exam: Vitals:   11/20/22 0400 11/20/22 0741 11/20/22 0800 11/20/22 1000  BP: 118/89 128/85 110/75   Pulse:   72 76  Resp:    20  Temp: 97.9 F (36.6 C) 97.9 F (36.6 C)    TempSrc: Oral Oral    SpO2:  94%  91%  Weight:      Height:       General exam: Appears calm and comfortable  Respiratory system: Crackles in the bases bilaterally. Respiratory effort normal. Cardiovascular system: S1 & S2  heard, RRR. No JVD, murmurs, rubs, gallops or clicks. No pedal edema. Gastrointestinal system: Abdomen is nondistended, soft and nontender. No organomegaly or masses felt. Normal bowel sounds heard. Central nervous system: Alert and oriented. No focal neurological deficits. Extremities: Symmetric 5 x 5 power. Skin: No rashes, lesions or ulcers Psychiatry: Judgement and insight appear normal. Mood & affect appropriate.    Data Reviewed:  CT and lab results reviewed.  Family Communication: Husband updated at the bedside.  Disposition: Status is: Inpatient Remains inpatient appropriate because: Severity of disease, IV treatment. Patient condition still critical, need to follow closely.     Time spent: 55 minutes  Author: Marrion Coy, MD 11/20/2022 11:56 AM  For on call review [email or web address].

## 2022-11-20 NOTE — Telephone Encounter (Signed)
Last filled on 08/08/22 #30 tabs/ 3 refills. Last OV was a f/u on back on 06/21/22

## 2022-11-21 DIAGNOSIS — J441 Chronic obstructive pulmonary disease with (acute) exacerbation: Secondary | ICD-10-CM | POA: Diagnosis not present

## 2022-11-21 DIAGNOSIS — R410 Disorientation, unspecified: Secondary | ICD-10-CM | POA: Insufficient documentation

## 2022-11-21 DIAGNOSIS — I5033 Acute on chronic diastolic (congestive) heart failure: Secondary | ICD-10-CM | POA: Diagnosis not present

## 2022-11-21 DIAGNOSIS — J9601 Acute respiratory failure with hypoxia: Secondary | ICD-10-CM | POA: Diagnosis not present

## 2022-11-21 LAB — LEGIONELLA PNEUMOPHILA SEROGP 1 UR AG: L. pneumophila Serogp 1 Ur Ag: NEGATIVE

## 2022-11-21 LAB — CBC
HCT: 33.8 % — ABNORMAL LOW (ref 36.0–46.0)
Hemoglobin: 11.6 g/dL — ABNORMAL LOW (ref 12.0–15.0)
MCH: 28.9 pg (ref 26.0–34.0)
MCHC: 34.3 g/dL (ref 30.0–36.0)
MCV: 84.1 fL (ref 80.0–100.0)
Platelets: 131 10*3/uL — ABNORMAL LOW (ref 150–400)
RBC: 4.02 MIL/uL (ref 3.87–5.11)
RDW: 12.9 % (ref 11.5–15.5)
WBC: 11.2 10*3/uL — ABNORMAL HIGH (ref 4.0–10.5)
nRBC: 0 % (ref 0.0–0.2)

## 2022-11-21 LAB — BASIC METABOLIC PANEL
Anion gap: 10 (ref 5–15)
BUN: 27 mg/dL — ABNORMAL HIGH (ref 8–23)
CO2: 26 mmol/L (ref 22–32)
Calcium: 8.4 mg/dL — ABNORMAL LOW (ref 8.9–10.3)
Chloride: 100 mmol/L (ref 98–111)
Creatinine, Ser: 0.63 mg/dL (ref 0.44–1.00)
GFR, Estimated: >60 mL/min (ref >60)
Glucose, Bld: 159 mg/dL — ABNORMAL HIGH (ref 70–99)
Potassium: 2.9 mmol/L — ABNORMAL LOW (ref 3.5–5.1)
Sodium: 136 mmol/L (ref 135–145)

## 2022-11-21 LAB — PHOSPHORUS: Phosphorus: 3.1 mg/dL (ref 2.5–4.6)

## 2022-11-21 LAB — MAGNESIUM: Magnesium: 2.2 mg/dL (ref 1.7–2.4)

## 2022-11-21 LAB — C-REACTIVE PROTEIN: CRP: 2 mg/dL — ABNORMAL HIGH (ref <1.0)

## 2022-11-21 MED ORDER — POTASSIUM CHLORIDE 10 MEQ/100ML IV SOLN
10.0000 meq | Freq: Once | INTRAVENOUS | Status: AC
  Administered 2022-11-21: 10 meq via INTRAVENOUS
  Filled 2022-11-21: qty 100

## 2022-11-21 MED ORDER — SPIRONOLACTONE 25 MG PO TABS
25.0000 mg | ORAL_TABLET | Freq: Every day | ORAL | Status: DC
  Administered 2022-11-21 – 2022-11-25 (×5): 25 mg via ORAL
  Filled 2022-11-21 (×5): qty 1

## 2022-11-21 MED ORDER — ORAL CARE MOUTH RINSE: 15.0000 mL | OROMUCOSAL | Status: DC | PRN

## 2022-11-21 MED ORDER — POTASSIUM CHLORIDE 10 MEQ/100ML IV SOLN
10.0000 meq | INTRAVENOUS | Status: AC
  Administered 2022-11-21: 10 meq via INTRAVENOUS
  Filled 2022-11-21 (×2): qty 100

## 2022-11-21 MED ORDER — POTASSIUM CHLORIDE CRYS ER 20 MEQ PO TBCR
40.0000 meq | EXTENDED_RELEASE_TABLET | ORAL | Status: AC
  Administered 2022-11-21 (×2): 40 meq via ORAL
  Filled 2022-11-21 (×2): qty 2

## 2022-11-21 NOTE — Progress Notes (Signed)
The patient has confusion as to her location.  She is saying that she is in an apartment building.  She has removed all of her equipment and her gown attempting to go to the bathroom.She has removed her PIV. She reorients easily.

## 2022-11-21 NOTE — Plan of Care (Signed)
?  Problem: Education: ?Goal: Ability to demonstrate management of disease process will improve ?Outcome: Progressing ?Goal: Ability to verbalize understanding of medication therapies will improve ?Outcome: Progressing ?Goal: Individualized Educational Video(s) ?Outcome: Progressing ?  ?Problem: Activity: ?Goal: Capacity to carry out activities will improve ?Outcome: Progressing ?  ?Problem: Cardiac: ?Goal: Ability to achieve and maintain adequate cardiopulmonary perfusion will improve ?Outcome: Progressing ?  ?Problem: Education: ?Goal: Knowledge of disease or condition will improve ?Outcome: Progressing ?Goal: Knowledge of the prescribed therapeutic regimen will improve ?Outcome: Progressing ?Goal: Individualized Educational Video(s) ?Outcome: Progressing ?  ?Problem: Activity: ?Goal: Ability to tolerate increased activity will improve ?Outcome: Progressing ?Goal: Will verbalize the importance of balancing activity with adequate rest periods ?Outcome: Progressing ?  ?Problem: Respiratory: ?Goal: Ability to maintain a clear airway will improve ?Outcome: Progressing ?Goal: Levels of oxygenation will improve ?Outcome: Progressing ?Goal: Ability to maintain adequate ventilation will improve ?Outcome: Progressing ?  ?Problem: Activity: ?Goal: Ability to tolerate increased activity will improve ?Outcome: Progressing ?  ?Problem: Clinical Measurements: ?Goal: Ability to maintain a body temperature in the normal range will improve ?Outcome: Progressing ?  ?Problem: Respiratory: ?Goal: Ability to maintain adequate ventilation will improve ?Outcome: Progressing ?Goal: Ability to maintain a clear airway will improve ?Outcome: Progressing ?  ?Problem: Education: ?Goal: Knowledge of General Education information will improve ?Description: Including pain rating scale, medication(s)/side effects and non-pharmacologic comfort measures ?Outcome: Progressing ?  ?Problem: Health Behavior/Discharge Planning: ?Goal: Ability to manage  health-related needs will improve ?Outcome: Progressing ?  ?Problem: Clinical Measurements: ?Goal: Ability to maintain clinical measurements within normal limits will improve ?Outcome: Progressing ?Goal: Will remain free from infection ?Outcome: Progressing ?Goal: Diagnostic test results will improve ?Outcome: Progressing ?Goal: Respiratory complications will improve ?Outcome: Progressing ?Goal: Cardiovascular complication will be avoided ?Outcome: Progressing ?  ?Problem: Activity: ?Goal: Risk for activity intolerance will decrease ?Outcome: Progressing ?  ?Problem: Nutrition: ?Goal: Adequate nutrition will be maintained ?Outcome: Progressing ?  ?Problem: Coping: ?Goal: Level of anxiety will decrease ?Outcome: Progressing ?  ?Problem: Elimination: ?Goal: Will not experience complications related to bowel motility ?Outcome: Progressing ?Goal: Will not experience complications related to urinary retention ?Outcome: Progressing ?  ?Problem: Pain Managment: ?Goal: General experience of comfort will improve ?Outcome: Progressing ?  ?Problem: Safety: ?Goal: Ability to remain free from injury will improve ?Outcome: Progressing ?  ?Problem: Skin Integrity: ?Goal: Risk for impaired skin integrity will decrease ?Outcome: Progressing ?  ?

## 2022-11-21 NOTE — Progress Notes (Signed)
Progress Note   Patient: Anita Harrell [id number]:[id number] DOB: 01/05/1951 DOA: 11/18/2022     3 DOS: the patient was seen and examined on 11/21/2022   Brief hospital course: Anita Harrell is a 72 y.o. female with medical history significant of atrial fibrillation, HFpEF, COPD, hypertension, hyperlipidemia presenting with acute respiratory failure with hypoxia, pneumonia, COPD and CHF exacerbations.   Patient had an increased cough, shortness of breath and wheezing. Upon arriving the hospital, patient had hypoxemia of 80% on room air, initially put on 2 L oxygen, then changed to heated high flow. Patient was found to have positive COVID, chest x-ray showed multifocal bronchopneumonia. Patient is started on IV steroids, Rocephin and Zithromax.  Patient was also started on Lasix. Condition gradually improving.   Active Problems:   Acute respiratory failure with hypoxia (HCC)   Bronchopneumonia   COPD with acute exacerbation (HCC)   HTN (hypertension)   (HFpEF) heart failure with preserved ejection fraction (HCC)   Hyperlipidemia   Hypokalemia   Obesity (BMI 30-39.9)   Former smoker   COVID-19   Atrial fibrillation, chronic (HCC)   Thrombocytopenia (HCC)   Hyponatremia   Hypophosphatemia   Metabolic acidosis   Assessment and Plan: Acute respiratory failure with hypoxia (HCC) COVID-pneumonia. Secondary bacterial pneumonia. Acute respite failure appears to be multifactorial, including pneumonia, COPD exacerbation, exacerbation of diastolic congestive heart failure. Patient currently on heated high flow,  high risk of deterioration.  Will monitor closely in the progressive unit. Patient does not need remdesivir as patient COVID was diagnosed 5 days ago.  Due to severe respiratory failure, patient will be given IV steroids. Continue antibiotics with Rocephin and Zithromax, will complete a 5-day course.  Calcitonin level only minimal elevated. CRP level 5.9, repeat level 2.0. CT  chest with contrast showed significant groundglass changes, typical of a COVID-pneumonia.  Patient also had 2.8 cm lung mass. Overnight, patient had some agitation, but overall oxygenation is better.  Will continue current treatment.  Delirium. Patient in the middle of night, confused, pulling out her oxygen. She seemed to be better this morning.  No longer confused. She normally does not sleep well during the night, but she slept more deeply last night after taking Seroquel.  I will continue Seroquel for 1 more night, if delirium happen again tonight, Seroquel has to be discontinued.   Right lower lobe lung mass. CT scan also identified a 2.8 cm lung mass in the right lower lobe, patient states that she has been followed by pulmonology for this.   COPD with acute exacerbation [facility]) Patient had some bronchospasm at the time of admission, continue antibiotics and steroids.   Acute on chronic (HFpEF) heart failure with preserved ejection fraction [facility]) 2D echo December 2023 with EF 55 to 60% Patient had some orthopnea, some crackles in the base.  May have mild CHF exacerbation.  Given severe acute respiratory failure, patient will need to dry up to lungs further.   Increase furosemide to 40 mg twice a day.  Continue to follow.   Hypokalemia Hyponatremia. Metabolic acidosis. Hypophosphatemia. Condition all improved.  Given another treatment of oral phosphate.   HTN (hypertension) BP stable      Hyperlipidemia Continue statin   Thrombocytopenia (HCC) Appears to be chronic, worsening due to COVID.  Continue to follow.  No active bleeding.   Atrial fibrillation, chronic (HCC) Rate controlled at present  On flecainide, Eliquis Patient has bradycardia, discontinued diltiazem.   Tobacco abuse with vaping. Advised to quit.  Subjective: Patient was agitated last night, she slept well, she pulled out her oxygen during the sleep.  Husband will stay with her tonight. She  feels better today, no confusion.  Physical Exam: Vitals:   11/21/22 0726 11/21/22 0728 11/21/22 1100 11/21/22 1120  BP:  139/74    Pulse:  74 70   Resp:   (!) 27   Temp:  98.2 F (36.8 C)  97.8 F (36.6 C)  TempSrc:    Axillary  SpO2: 97% 93% 97%   Weight:      Height:       General exam: Appears calm and comfortable  Respiratory system: Crackles in the left base.  Respiratory effort normal. Cardiovascular system: S1 & S2 heard, RRR. No JVD, murmurs, rubs, gallops or clicks. No pedal edema. Gastrointestinal system: Abdomen is nondistended, soft and nontender. No organomegaly or masses felt. Normal bowel sounds heard. Central nervous system: Alert and oriented. No focal neurological deficits. Extremities: Symmetric 5 x 5 power. Skin: No rashes, lesions or ulcers Psychiatry: Judgement and insight appear normal. Mood & affect appropriate.    Data Reviewed:  Lab results reviewed.  Family Communication: Husband updated at the bedside.  Disposition: Status is: Inpatient Remains inpatient appropriate because: Severity of disease, IV treatment.     Time spent: 50 minutes  Author: Marrion Coy, MD 11/21/2022 11:49 AM  For on call review [email or web address].

## 2022-11-21 NOTE — Progress Notes (Signed)
[facility] HF Stewardship  PCP: Tower, Audrie Gallus, MD  PCP-Cardiologist: Julien Nordmann, MD  HPI: Anita Harrell is a 72 y.o. female with atrial fibrillation, HFpEF, COPD, hypertension, hyperlipidemia who presented with acute respiratory failure with hypoxia, pneumonia, COPD & CHF exacerbations. Found to have COVID-19 PNA and possible secondary bacterial pneumonia. Treated with steroids and antibiotics. Given IV furosemide for hypervolemia on admission.   Pertinent cardiac history: TTE 08/2021 showed LVEF of 55-60% with LA dilation and mild MR. Negative exercise test in 12/2021. Long term monitor in 12/2021 showed continuous Afib. TTE 02/2022 showed LVEF 50-55%.   Pertinent Lab Values: Creatinine, Ser  Date Value Ref Range Status  11/21/2022 0.63 0.44 - 1.00 mg/dL Final   BUN  Date Value Ref Range Status  11/21/2022 27 (H) 8 - 23 mg/dL Final   Potassium  Date Value Ref Range Status  11/21/2022 2.9 (L) 3.5 - 5.1 mmol/L Final   Sodium  Date Value Ref Range Status  11/21/2022 136 135 - 145 mmol/L Final   B Natriuretic Peptide  Date Value Ref Range Status  11/18/2022 222.1 (H) 0.0 - 100.0 pg/mL Final    Comment:    Performed at [facility], [address] [place], [place] [address]   Magnesium  Date Value Ref Range Status  11/21/2022 2.2 1.7 - 2.4 mg/dL Final    Comment:    Performed at [facility], [address] [place], [place] [address]   Hgb A1c MFr Bld  Date Value Ref Range Status  06/07/2021 6.1 4.6 - 6.5 % Final    Comment:    Glycemic Control Guidelines for People with Diabetes:Non Diabetic:  <6%Goal of Therapy: <7%Additional Action Suggested:  >8%    TSH  Date Value Ref Range Status  06/07/2021 2.14 0.35 - 5.50 uIU/mL Final    Vital Signs: Temp:  [97.6 F (36.4 C)-98.2 F (36.8 C)] 98.2 F (36.8 C) (09/10 0728) Pulse Rate:  [72-76] 74 (09/10 0728) Cardiac Rhythm: Normal sinus rhythm;Bundle branch block (09/09 2000) Resp:  [20] 20  (09/09 1600) BP: (114-139)/(74-96) 139/74 (09/10 0728) SpO2:  [71 %-97 %] 93 % (09/10 0728) FiO2 (%):  [35 %-55 %] 55 % (09/10 0758)   Intake/Output Summary (Last 24 hours) at 11/21/2022 0827 Last data filed at 11/21/2022 0400 Gross per 24 hour  Intake 240 ml  Output --  Net 240 ml    Current Inpatient Medications:  -Furosemide 40 mg IV BID -Metoprolol succinate 50 mg daily  Prior to admission HF Medications:  -Furosemide 20 mg daily + potassium 30 meq daily -Metoprolol succinate 50 mg daily  Assessment: 1. Diastolic heart failure (LVEF 50-55%), due to unknown etiology. NYHA class IV symptoms.  -O2 down from 40L to 8L. Symptoms most likely due to PNA with CHF and COPD somewhat contributing. Creatinine stable and furosemide increased to BID 9/9.  -BP elevated today. Persistent hypokalemia. Can consider spironolactone 12.5 mg daily when suspected to be low risk for intubation for HFpEF (TOPCAT), hypoK, and diuresis.  -Maintaining NSR on flecainide. HR 70s. Cautioned use in cardiomyopathy, however does not appear to be ischemic in nature.  Plan: 1) Medication changes recommended at this time: -None  2) Patient assistance: -Pending  3) Education: -To be completed prior to discharge.   Medication Assistance / Insurance Benefits Check:  Does the patient have prescription insurance?    Type of insurance plan:   Does the patient qualify for medication assistance through manufacturers or grants? Pending   Eligible  grants and/or patient assistance programs: pending   Medication assistance applications in progress: pending   Medication assistance applications approved: pending  Outpatient Pharmacy:  Prior to admission outpatient pharmacy: [facility]     Thank you for involving pharmacy in this patient's care.  Enos Fling, PharmD, BCPS Phone - [phone] Clinical Pharmacist 11/21/2022 8:27 AM

## 2022-11-22 ENCOUNTER — Other Ambulatory Visit (HOSPITAL_COMMUNITY): Payer: Self-pay

## 2022-11-22 DIAGNOSIS — J9601 Acute respiratory failure with hypoxia: Secondary | ICD-10-CM | POA: Diagnosis not present

## 2022-11-22 DIAGNOSIS — I5033 Acute on chronic diastolic (congestive) heart failure: Secondary | ICD-10-CM | POA: Diagnosis not present

## 2022-11-22 DIAGNOSIS — E871 Hypo-osmolality and hyponatremia: Secondary | ICD-10-CM

## 2022-11-22 DIAGNOSIS — E669 Obesity, unspecified: Secondary | ICD-10-CM

## 2022-11-22 DIAGNOSIS — E876 Hypokalemia: Secondary | ICD-10-CM

## 2022-11-22 DIAGNOSIS — I10 Essential (primary) hypertension: Secondary | ICD-10-CM

## 2022-11-22 DIAGNOSIS — J441 Chronic obstructive pulmonary disease with (acute) exacerbation: Secondary | ICD-10-CM | POA: Diagnosis not present

## 2022-11-22 DIAGNOSIS — J180 Bronchopneumonia, unspecified organism: Secondary | ICD-10-CM | POA: Diagnosis not present

## 2022-11-22 DIAGNOSIS — D696 Thrombocytopenia, unspecified: Secondary | ICD-10-CM

## 2022-11-22 DIAGNOSIS — R410 Disorientation, unspecified: Secondary | ICD-10-CM

## 2022-11-22 DIAGNOSIS — E785 Hyperlipidemia, unspecified: Secondary | ICD-10-CM

## 2022-11-22 LAB — CBC
HCT: 35.2 % — ABNORMAL LOW (ref 36.0–46.0)
Hemoglobin: 12.1 g/dL (ref 12.0–15.0)
MCH: 29.2 pg (ref 26.0–34.0)
MCHC: 34.4 g/dL (ref 30.0–36.0)
MCV: 84.8 fL (ref 80.0–100.0)
Platelets: 142 10*3/uL — ABNORMAL LOW (ref 150–400)
RBC: 4.15 MIL/uL (ref 3.87–5.11)
RDW: 12.9 % (ref 11.5–15.5)
WBC: 14.8 10*3/uL — ABNORMAL HIGH (ref 4.0–10.5)
nRBC: 0 % (ref 0.0–0.2)

## 2022-11-22 LAB — BASIC METABOLIC PANEL
Anion gap: 10 (ref 5–15)
BUN: 24 mg/dL — ABNORMAL HIGH (ref 8–23)
CO2: 25 mmol/L (ref 22–32)
Calcium: 8.5 mg/dL — ABNORMAL LOW (ref 8.9–10.3)
Chloride: 101 mmol/L (ref 98–111)
Creatinine, Ser: 0.59 mg/dL (ref 0.44–1.00)
GFR, Estimated: >60 mL/min (ref >60)
Glucose, Bld: 134 mg/dL — ABNORMAL HIGH (ref 70–99)
Potassium: 4.3 mmol/L (ref 3.5–5.1)
Sodium: 136 mmol/L (ref 135–145)

## 2022-11-22 LAB — MAGNESIUM: Magnesium: 2.6 mg/dL — ABNORMAL HIGH (ref 1.7–2.4)

## 2022-11-22 NOTE — Assessment & Plan Note (Signed)
Improved

## 2022-11-22 NOTE — Plan of Care (Signed)
  Problem: Education: Goal: Knowledge of disease or condition will improve Outcome: Progressing   Problem: Respiratory: Goal: Ability to maintain a clear airway will improve Outcome: Progressing   Problem: Clinical Measurements: Goal: Cardiovascular complication will be avoided Outcome: Progressing   Problem: Activity: Goal: Risk for activity intolerance will decrease Outcome: Progressing   Problem: Pain Managment: Goal: General experience of comfort will improve Outcome: Progressing   Problem: Safety: Goal: Ability to remain free from injury will improve Outcome: Progressing

## 2022-11-22 NOTE — TOC Benefit Eligibility Note (Signed)
Patient Product/process development scientist completed.    The patient is insured through [employer]. Patient has Medicare and is not eligible for a copay card, but may be able to apply for patient assistance, if available.    Ran test claim for Farxiga 10 mg and the current 30 day co-pay is $136.77 due to being in Coverage Gap (donut hole).  Ran test claim for Jardiance 10 mg and the current 30 day co-pay is $143.55 .due to being in Coverage Gap (donut hole)   This test claim was processed through [employer]- copay amounts may vary at other pharmacies due to [employer], or as the patient moves through the different stages of their insurance plan.     Roland Earl, CPHT Pharmacy Technician III Certified Patient Advocate [facility] Patient Advocate Team Direct Number: [phone]  Fax: [phone]

## 2022-11-22 NOTE — Consult Note (Signed)
[facility] Customer service manager [facility]) Accountable Care Organization (ACO) [facility] Liaison Note  11/22/2022  [name] 06/15/1950 [id number]  Location: [facility] RN Hospital Liaison screened the patient remotely at [facility].  Insurance:  Medicare   Anita Harrell is a 72 y.o. female who is a Primary Care Patient of Tower, Audrie Gallus, MD. The patient was screened for readmission hospitalization with noted high risk score for unplanned readmission risk with 1 IP in 6 months.  The patient was assessed for potential [facility]) Care Management service needs for post hospital transition for care coordination. Review of patient's electronic medical record reveals patient was admitted for Bronchopneumonia. Pt currently on isolation as the hospital liaison will not visit via bedside to preserve PPE for immediate staff. Will continue to monitor for post-hospital dischare and plan accordingly for the appropriate referral based upn this diagnosis.   Plan: [facility] Liaison will continue to follow progress and disposition to asess for post hospital community care coordination/management needs.  Referral request for community care coordination: Will make a referral for a care coordinator for care management services.  Addendum 11/27/22- Pt discharged 9/14 with [facility] on home O2 in the care of his spouse.    [facility] Care Management/Population Health does not replace or interfere with any arrangements made by the Inpatient Transition of Care team.   For questions contact:   Anita Cousin, RN, [facility] Liaison [place]   Population Health Office Hours MTWF  8:00 am-6:00 pm [phone] mobile [phone] [Office toll free line] Office Hours are M-F 8:30 - 5 pm [email or web address]

## 2022-11-22 NOTE — Assessment & Plan Note (Signed)
Replaced. °

## 2022-11-22 NOTE — Assessment & Plan Note (Signed)
BMI 34.37 

## 2022-11-22 NOTE — Progress Notes (Signed)
Progress Note   Patient: Anita Harrell [id number]:[id number] DOB: 27-Jul-1950 DOA: 11/18/2022     4 DOS: the patient was seen and examined on 11/22/2022   Brief hospital course: Anita Harrell is a 72 y.o. female with medical history significant of atrial fibrillation, HFpEF, COPD, hypertension, hyperlipidemia presenting with acute respiratory failure with hypoxia, pneumonia, COPD and CHF exacerbations.   Patient had an increased cough, shortness of breath and wheezing. Upon arriving the hospital, patient had hypoxemia of 80% on room air, initially put on 2 L oxygen, then changed to heated high flow. Patient was found to have positive COVID, chest x-ray showed multifocal bronchopneumonia. Patient is started on IV steroids, Rocephin and Zithromax.  Patient was also started on Lasix.  9/11.  Patient started down to 4 L nasal cannula today.  Assessment and Plan: Acute respiratory failure with hypoxia (HCC) The patient does not wear oxygen at home.  Was on heated high flow nasal cannula earlier in the hospital course.  Now down to 4 L.  Will try to see if we can get off oxygen prior to disposition.  Bronchopneumonia Multi lobar COVID-19 pneumonia.  Patient also completed empiric Rocephin and Zithromax.  Continue Solu-Medrol.  COPD with acute exacerbation (HCC) Continue Solu-Medrol  (HFpEF) heart failure with preserved ejection fraction (HCC) Acute on chronic.  2D echo December 2023 with EF 50-55% Patient on IV Lasix also.  Continue Toprol and spironolactone.  HTN (hypertension) Continue Toprol, spironolactone  Hyperlipidemia Continue statin  Hypokalemia Replaced  Obesity (BMI 30-39.9) BMI 34.37  Delirium Resolved  Metabolic acidosis Improved  Hypophosphatemia Replaced  Hyponatremia Resolved  Thrombocytopenia (HCC) Appears chronic, likely lower with viral infection.  Atrial fibrillation, chronic (HCC) Continue home medications: Diltiazem, flecainide, Eliquis   Former  smoker Pt now vaping           Subjective: Patient was down to 4 L today.  Admitted with COVID pneumonia and hypoxia.  Physical Exam: Vitals:   11/22/22 0533 11/22/22 0755 11/22/22 0826 11/22/22 1154  BP: (!) 159/68  132/63 (!) 135/109  Pulse: 72     Resp: 20 18    Temp: (!) 96.3 F (35.7 C)  98 F (36.7 C) 97.7 F (36.5 C)  TempSrc:   Oral Oral  SpO2: 94%   95%  Weight:      Height:       Physical Exam HENT:     Head: Normocephalic.     Mouth/Throat:     Pharynx: No oropharyngeal exudate.  Eyes:     General: Lids are normal.     Conjunctiva/sclera: Conjunctivae normal.  Cardiovascular:     Rate and Rhythm: Normal rate and regular rhythm.     Heart sounds: Normal heart sounds, S1 normal and S2 normal.  Pulmonary:     Breath sounds: Examination of the right-lower field reveals decreased breath sounds and rhonchi. Examination of the left-lower field reveals decreased breath sounds and rhonchi. Decreased breath sounds and rhonchi present. No wheezing or rales.  Abdominal:     Palpations: Abdomen is soft.     Tenderness: There is no abdominal tenderness.  Musculoskeletal:     Right lower leg: No swelling.     Left lower leg: No swelling.  Skin:    General: Skin is warm.     Findings: No rash.  Neurological:     Mental Status: She is alert and oriented to person, place, and time.     Data Reviewed: Creatinine 0.59, white blood cell 14.8,  hemoglobin 12.1, platelet count 142  Family Communication: Updated patient's husband on the phone  Disposition: Status is: Inpatient Remains inpatient appropriate because: Trying to see if we can get off oxygen prior to disposition  Planned Discharge Destination: Home    Time spent: 28 minutes  Author: Alford Highland, MD 11/22/2022 4:14 PM  For on call review [email or web address].

## 2022-11-22 NOTE — Progress Notes (Signed)
[facility] HF Stewardship  PCP: Tower, Audrie Gallus, MD  PCP-Cardiologist: Julien Nordmann, MD  HPI: Anita Harrell is a 72 y.o. female with atrial fibrillation, HFpEF, COPD, hypertension, hyperlipidemia who presented with acute respiratory failure with hypoxia, pneumonia, COPD & CHF exacerbations. Found to have COVID-19 PNA and possible secondary bacterial pneumonia. Treated with steroids and antibiotics. Given IV furosemide for hypervolemia on admission.   Pertinent cardiac history: TTE 08/2021 showed LVEF of 55-60% with LA dilation and mild MR. Negative exercise test in 12/2021. Long term monitor in 12/2021 showed continuous Afib. TTE 02/2022 showed LVEF 50-55%.   Pertinent Lab Values: Creatinine, Ser  Date Value Ref Range Status  11/22/2022 0.59 0.44 - 1.00 mg/dL Final   BUN  Date Value Ref Range Status  11/22/2022 24 (H) 8 - 23 mg/dL Final   Potassium  Date Value Ref Range Status  11/22/2022 4.3 3.5 - 5.1 mmol/L Final   Sodium  Date Value Ref Range Status  11/22/2022 136 135 - 145 mmol/L Final   B Natriuretic Peptide  Date Value Ref Range Status  11/18/2022 222.1 (H) 0.0 - 100.0 pg/mL Final    Comment:    Performed at [facility], [address] [place], [place] [address]   Magnesium  Date Value Ref Range Status  11/22/2022 2.6 (H) 1.7 - 2.4 mg/dL Final    Comment:    Performed at [facility], [address] [place], [place] [address]   Hgb A1c MFr Bld  Date Value Ref Range Status  06/07/2021 6.1 4.6 - 6.5 % Final    Comment:    Glycemic Control Guidelines for People with Diabetes:Non Diabetic:  <6%Goal of Therapy: <7%Additional Action Suggested:  >8%    TSH  Date Value Ref Range Status  06/07/2021 2.14 0.35 - 5.50 uIU/mL Final    Vital Signs: Temp:  [96.3 F (35.7 C)-97.9 F (36.6 C)] 96.3 F (35.7 C) (09/11 0533) Pulse Rate:  [66-72] 72 (09/11 0533) Cardiac Rhythm: Normal sinus rhythm;Bundle branch block (09/10 1900) Resp:  [19-27] 20  (09/11 0533) BP: (110-159)/(50-73) 159/68 (09/11 0533) SpO2:  [91 %-97 %] 94 % (09/11 0533) FiO2 (%):  [40 %-55 %] 40 % (09/10 2048)   Intake/Output Summary (Last 24 hours) at 11/22/2022 0730 Last data filed at 11/21/2022 2200 Gross per 24 hour  Intake 360 ml  Output --  Net 360 ml    Current Inpatient Medications:  -Furosemide 40 mg IV BID -Metoprolol succinate 50 mg daily -Spironolactone 25 mg daily  Prior to admission HF Medications:  -Furosemide 20 mg daily + potassium 30 meq daily -Metoprolol succinate 50 mg daily  Assessment: 1. Diastolic heart failure (LVEF 50-55%), due to unknown etiology. NYHA class IV symptoms.  -O2 down from 40L to 5L. Symptoms most likely due to PNA with CHF and COPD somewhat contributing. Creatinine stable and furosemide increased to BID 9/9.  -BP elevated this AM. Hypokalemia improved with spironolactone and supplementation.  -Maintaining NSR on flecainide. HR 70s. Cautioned use in cardiomyopathy, however does not appear to be ischemic in nature. -Can consider starting SGLT2i for first line HFpEF treatement and to augment diuresis, however copays are high due to the Medicare coverage gap. Can consider patient assistance if initiated.  Plan: 1) Medication changes recommended at this time: -Can consider starting SGLT2i when stable from infection standpoint.  2) Patient assistance: -Currently in donut hole, making Farxiga copay $136.77 and Jardiance $143.55  3) Education: -To be completed prior to discharge.   Medication  Assistance / Insurance Benefits Check:  Does the patient have prescription insurance?    Type of insurance plan:   Does the patient qualify for medication assistance through manufacturers or grants? Pending  Eligible grants and/or patient assistance programs: pending  Medication assistance applications in progress: pending  Medication assistance applications approved: pending  Outpatient Pharmacy:  Prior to admission  outpatient pharmacy: [facility]     Thank you for involving pharmacy in this patient's care.  Enos Fling, PharmD, BCPS Phone - [phone] Clinical Pharmacist 11/22/2022 7:30 AM

## 2022-11-22 NOTE — Assessment & Plan Note (Addendum)
Replaced. °

## 2022-11-22 NOTE — Assessment & Plan Note (Signed)
Resolved

## 2022-11-22 NOTE — Assessment & Plan Note (Addendum)
Last sodium 1 point less than normal range.

## 2022-11-23 DIAGNOSIS — J441 Chronic obstructive pulmonary disease with (acute) exacerbation: Secondary | ICD-10-CM | POA: Diagnosis not present

## 2022-11-23 DIAGNOSIS — U071 COVID-19: Secondary | ICD-10-CM | POA: Diagnosis not present

## 2022-11-23 DIAGNOSIS — J9601 Acute respiratory failure with hypoxia: Secondary | ICD-10-CM | POA: Diagnosis not present

## 2022-11-23 DIAGNOSIS — J180 Bronchopneumonia, unspecified organism: Secondary | ICD-10-CM | POA: Diagnosis not present

## 2022-11-23 LAB — CULTURE, BLOOD (ROUTINE X 2)
Culture: NO GROWTH
Culture: NO GROWTH

## 2022-11-23 MED ORDER — ONDANSETRON HCL 4 MG/2ML IJ SOLN
4.0000 mg | Freq: Four times a day (QID) | INTRAMUSCULAR | Status: DC | PRN
  Administered 2022-11-23: 4 mg via INTRAVENOUS
  Filled 2022-11-23: qty 2

## 2022-11-23 NOTE — Progress Notes (Addendum)
[facility] HF Stewardship  PCP: Tower, Audrie Gallus, MD  PCP-Cardiologist: Julien Nordmann, MD  HPI: Anita Harrell is a 72 y.o. female with atrial fibrillation, HFpEF, COPD, hypertension, hyperlipidemia who presented with acute respiratory failure with hypoxia, pneumonia, COPD & CHF exacerbations. Found to have COVID-19 PNA and possible secondary bacterial pneumonia. Treated with steroids and antibiotics. Given IV furosemide for hypervolemia on admission.   Pertinent cardiac history: TTE 08/2021 showed LVEF of 55-60% with LA dilation and mild MR. Negative exercise test in 12/2021. Long term monitor in 12/2021 showed continuous Afib. TTE 02/2022 showed LVEF 50-55%.   Pertinent Lab Values: Creatinine, Ser  Date Value Ref Range Status  11/22/2022 0.59 0.44 - 1.00 mg/dL Final   BUN  Date Value Ref Range Status  11/22/2022 24 (H) 8 - 23 mg/dL Final   Potassium  Date Value Ref Range Status  11/22/2022 4.3 3.5 - 5.1 mmol/L Final   Sodium  Date Value Ref Range Status  11/22/2022 136 135 - 145 mmol/L Final   B Natriuretic Peptide  Date Value Ref Range Status  11/18/2022 222.1 (H) 0.0 - 100.0 pg/mL Final    Comment:    Performed at [facility], [address] [place], [place] [address]   Magnesium  Date Value Ref Range Status  11/22/2022 2.6 (H) 1.7 - 2.4 mg/dL Final    Comment:    Performed at [facility], [address] [place], [place] [address]   Hgb A1c MFr Bld  Date Value Ref Range Status  06/07/2021 6.1 4.6 - 6.5 % Final    Comment:    Glycemic Control Guidelines for People with Diabetes:Non Diabetic:  <6%Goal of Therapy: <7%Additional Action Suggested:  >8%    TSH  Date Value Ref Range Status  06/07/2021 2.14 0.35 - 5.50 uIU/mL Final    Vital Signs: Temp:  [97.6 F (36.4 C)-98 F (36.7 C)] 98 F (36.7 C) (09/12 0425) Pulse Rate:  [73-83] 73 (09/12 0425) Cardiac Rhythm: Heart block (09/11 1930) Resp:  [18-20] 20 (09/12 0425) BP:  (122-140)/(50-109) 140/68 (09/12 0425) SpO2:  [92 %-96 %] 93 % (09/12 0425)   Intake/Output Summary (Last 24 hours) at 11/23/2022 0801 Last data filed at 11/23/2022 0981 Gross per 24 hour  Intake 120 ml  Output --  Net 120 ml    Current Inpatient Medications:  -Furosemide 40 mg IV BID -Metoprolol succinate 50 mg daily -Spironolactone 25 mg daily  Prior to admission HF Medications:  -Furosemide 20 mg daily + potassium 30 meq daily -Metoprolol succinate 50 mg daily  Assessment: 1. Diastolic heart failure (LVEF 50-55%), due to unknown etiology. NYHA class IV symptoms.  -O2 down from 40L to 3L. PNA likely contributing most to symptoms with CHF and COPD somewhat contributing. No BMP today. -BP elevated this AM with SBP 140s. Hypokalemia improved with spironolactone and supplementation.  -Maintaining NSR on flecainide. HR 70s. Since flecainide is usually cautioned in structural heart disease, may be better candidate for Tikosyn in the future. Cardiomyopathy does not appear to be ischemic in nature, so may also be reasonable to continue as well. Defer to MD. -Can consider starting SGLT2i for first line HFpEF treatement and to augment diuresis, however copays are high due to the Medicare coverage gap. Can consider patient assistance if initiated.  Plan: 1) Medication changes recommended at this time: -Can consider starting SGLT2i when stable from infection standpoint.  2) Patient assistance: -Currently in donut hole, making Farxiga copay $136.77 and Jardiance $143.55  3)  Education: -To be completed prior to discharge.   Medication Assistance / Insurance Benefits Check:  Does the patient have prescription insurance?    Type of insurance plan:   Does the patient qualify for medication assistance through manufacturers or grants? Pending  Eligible grants and/or patient assistance programs: pending  Medication assistance applications in progress: pending  Medication assistance  applications approved: pending  Outpatient Pharmacy:  Prior to admission outpatient pharmacy: [facility]     Thank you for involving pharmacy in this patient's care.  Enos Fling, PharmD, BCPS Phone - [phone] Clinical Pharmacist 11/23/2022 8:01 AM

## 2022-11-23 NOTE — Progress Notes (Signed)
Progress Note   Patient: Anita Harrell DOB: [birth year] DOA: 11/18/2022     5 DOS: the patient was seen and examined on 11/23/2022   Brief hospital course: Anita Harrell is a 72 y.o. female with medical history significant of atrial fibrillation, HFpEF, COPD, hypertension, hyperlipidemia presenting with acute respiratory failure with hypoxia, pneumonia, COPD and CHF exacerbations.   Patient had an increased cough, shortness of breath and wheezing. Upon arriving the hospital, patient had hypoxemia of 80% on room air, initially put on 2 L oxygen, then changed to heated high flow. Patient was found to have positive COVID, chest x-ray showed multifocal bronchopneumonia. Patient is started on IV steroids, Rocephin and Zithromax.  Patient was also started on Lasix.  9/11.  Patient started down to 4 L nasal cannula today. 9/12.  Patient was down to 3 L when I saw her this morning and will try to taper to off today.  Assessment and Plan: Acute respiratory failure with hypoxia (HCC) The patient does not wear oxygen at home.  Was on heated high flow nasal cannula earlier in the hospital course.  Now down to 3 L.  Try to taper off oxygen on a daily basis.  Bronchopneumonia Multi lobar COVID-19 pneumonia.  Patient also completed empiric Rocephin and Zithromax.  Continue Solu-Medrol.  COPD with acute exacerbation (HCC) Continue Solu-Medrol  (HFpEF) heart failure with preserved ejection fraction (HCC) Acute on chronic.  2D echo December 2023 with EF 50-55% Patient on IV Lasix also.  Continue Toprol and spironolactone.  HTN (hypertension) Continue Toprol, spironolactone  Hyperlipidemia Continue statin  Hypokalemia Replaced  Obesity (BMI 30-39.9) BMI 34.37  Delirium Resolved  Metabolic acidosis Improved  Hypophosphatemia Replaced  Hyponatremia Resolved  Thrombocytopenia (HCC) Appears chronic, likely lower with viral infection.  Atrial fibrillation, chronic  (HCC) Continue home medications: Diltiazem, flecainide, Eliquis.  Pharmacist to reach out to cardiologist about the flecainide.   Former smoker Pt now vaping           Subjective: Patient felt nauseous this morning and required some Zofran.  Patient still has some shortness of breath and cough and feels tired just with minimal activity.  Admitted with COVID-19 infection.  Physical Exam: Vitals:   11/23/22 0901 11/23/22 0940 11/23/22 0943 11/23/22 1225  BP: (!) 141/69   133/73  Pulse: 69 69  64  Resp: 18 17  18   Temp: 98 F (36.7 C)   (!) 97.3 F (36.3 C)  TempSrc:      SpO2: 95% 95% 93% 96%  Weight:      Height:       Physical Exam HENT:     Head: Normocephalic.     Mouth/Throat:     Pharynx: No oropharyngeal exudate.  Eyes:     General: Lids are normal.     Conjunctiva/sclera: Conjunctivae normal.  Cardiovascular:     Rate and Rhythm: Normal rate and regular rhythm.     Heart sounds: Normal heart sounds, S1 normal and S2 normal.  Pulmonary:     Breath sounds: Examination of the right-lower field reveals decreased breath sounds and rhonchi. Examination of the left-lower field reveals decreased breath sounds and rhonchi. Decreased breath sounds and rhonchi present. No wheezing or rales.  Abdominal:     Palpations: Abdomen is soft.     Tenderness: There is no abdominal tenderness.  Musculoskeletal:     Right lower leg: No swelling.     Left lower leg: No swelling.  Skin:  General: Skin is warm.     Findings: No rash.  Neurological:     Mental Status: She is alert and oriented to person, place, and time.     Data Reviewed: Creatinine 0.59, white blood count 14.8, hemoglobin 12.1  Family Communication: Spoke with husband at the bedside  Disposition: Status is: Inpatient Remains inpatient appropriate because: Trying to see if we can get off oxygen today  Planned Discharge Destination: Home    Time spent: 28 minutes Case discussed with pharmacist and  nursing staff.  Author: Alford Highland, MD 11/23/2022 12:59 PM  For on call review [email or web address].

## 2022-11-23 NOTE — TOC Progression Note (Signed)
Transition of Care [facility]) - Progression Note    Patient Details  Name: Anita Harrell MRN: [id number] Date of Birth: 1950/06/03  [facility] CM/SW Contact  Truddie Hidden, RN Phone Number: 11/23/2022, 1:36 PM  Clinical Narrative:    TOC continuing to follow patient's progress throughout discharge planning.        Expected Discharge Plan and Services                                               Social Determinants of Health (SDOH) Interventions SDOH Screenings   Food Insecurity: No Food Insecurity (11/18/2022)  Housing: Low Risk  (11/18/2022)  Transportation Needs: No Transportation Needs (11/18/2022)  Utilities: Not At Risk (11/18/2022)  Alcohol Screen: Low Risk  (05/18/2022)  Depression (PHQ2-9): Low Risk  (05/18/2022)  Recent Concern: Depression (PHQ2-9) - High Risk (05/17/2022)  Financial Resource Strain: Low Risk  (05/18/2022)  Physical Activity: Inactive (05/18/2022)  Social Connections: Moderately Integrated (05/18/2022)  Stress: Stress Concern Present (05/18/2022)  Tobacco Use: Medium Risk (11/18/2022)    Readmission Risk Interventions     No data to display

## 2022-11-23 NOTE — Evaluation (Signed)
Physical Therapy Evaluation Patient Details Name: Anita Harrell MRN: [id number] DOB: September 08, 1950 Today's Date: 11/23/2022  History of Present Illness  Anita Harrell is a 72 y.o. female with medical history significant of atrial fibrillation, HFpEF, COPD, hypertension, hyperlipidemia presenting with acute respiratory failure with hypoxia, pneumonia, COPD and CHF exacerbations.   Clinical Impression  Pt received in Semi-Fowler's position and reluctant to performing therapy due to having a worse day today than yesterday.  Pt noting that she had a significant amount of congestion throughout the day today and unsure of how much she would be able to do.  Pt performed bed mobility and was able to don personal brief in bed without difficulty.  Pt did have drop in saturation levels with mobility down to 80% without nasal cannula.  Unable to return with pursed lip breathing, so nasal cannula placed and pt rose and maintained 92%> the entire session.  Pt ambulated in the room with the FWW and then returned to chair.  Pt moves well and only limited due to the lines/leads within the room.  Pt left with all needs met and call bell within reach.          If plan is discharge home, recommend the following: A little help with walking and/or transfers;Help with stairs or ramp for entrance   Can travel by private vehicle        Equipment Recommendations None recommended by PT  Recommendations for Other Services       Functional Status Assessment Patient has had a recent decline in their functional status and demonstrates the ability to make significant improvements in function in a reasonable and predictable amount of time.     Precautions / Restrictions        Mobility  Bed Mobility Overal bed mobility: Modified Independent                  Transfers Overall transfer level: Modified independent Equipment used: Rolling walker (2 wheels)                     Ambulation/Gait Ambulation/Gait assistance: Supervision Gait Distance (Feet): 20 Feet Assistive device: Rolling walker (2 wheels) Gait Pattern/deviations: Step-through pattern Gait velocity: decreased     General Gait Details: smaller steps taken and pt more unsure of stability due to the weakness from Covid.  Stairs            Wheelchair Mobility     Tilt Bed    Modified Rankin (Stroke Patients Only)       Balance Overall balance assessment: Needs assistance Sitting-balance support: No upper extremity supported, Feet supported Sitting balance-Leahy Scale: Good     Standing balance support: No upper extremity supported, Bilateral upper extremity supported, During functional activity, Reliant on assistive device for balance Standing balance-Leahy Scale: Fair                               Pertinent Vitals/Pain Pain Assessment Pain Assessment: 0-10 Pain Score: 0-No pain    Home Living                          Prior Function                       Extremity/Trunk Assessment   Upper Extremity Assessment Upper Extremity Assessment: Overall WFL for tasks assessed    Lower Extremity  Assessment Lower Extremity Assessment: LLE deficits/detail;Generalized weakness LLE Deficits / Details: Pt with reduced hamstring strength compared to R side, 4/5 MMT       Communication   Communication Communication: No apparent difficulties  Cognition Arousal: Alert Behavior During Therapy: WFL for tasks assessed/performed Overall Cognitive Status: Within Functional Limits for tasks assessed                                          General Comments      Exercises     Assessment/Plan    PT Assessment Patient needs continued PT services  PT Problem List Decreased strength;Decreased activity tolerance;Decreased balance;Decreased mobility;Decreased knowledge of use of DME;Decreased safety awareness       PT Treatment  Interventions Gait training;Stair training;Functional mobility training;Therapeutic activities;DME instruction;Therapeutic exercise;Balance training;Neuromuscular re-education    PT Goals (Current goals can be found in the Care Plan section)  Acute Rehab PT Goals Patient Stated Goal: to get back home. PT Goal Formulation: With patient Time For Goal Achievement: 12/07/22 Potential to Achieve Goals: Good    Frequency Min 1X/week     Co-evaluation               AM-PAC PT "6 Clicks" Mobility  Outcome Measure Help needed turning from your back to your side while in a flat bed without using bedrails?: A Little Help needed moving from lying on your back to sitting on the side of a flat bed without using bedrails?: A Little Help needed moving to and from a bed to a chair (including a wheelchair)?: A Little Help needed standing up from a chair using your arms (e.g., wheelchair or bedside chair)?: A Little Help needed to walk in hospital room?: A Little Help needed climbing 3-5 steps with a railing? : A Lot 6 Click Score: 17    End of Session Equipment Utilized During Treatment: Gait belt Activity Tolerance: Patient tolerated treatment well;Patient limited by fatigue Patient left: in chair;with call bell/phone within reach Nurse Communication: Mobility status PT Visit Diagnosis: Unsteadiness on feet (R26.81);Other abnormalities of gait and mobility (R26.89);Muscle weakness (generalized) (M62.81);Difficulty in walking, not elsewhere classified (R26.2)    Time: 1430-1500 PT Time Calculation (min) (ACUTE ONLY): 30 min   Charges:   PT Evaluation $PT Eval Low Complexity: 1 Low PT Treatments $Therapeutic Activity: 8-22 mins PT General Charges $$ ACUTE PT VISIT: 1 Visit         Nolon Bussing, PT, DPT Physical Therapist - [facility]  11/23/22, 4:02 PM

## 2022-11-23 NOTE — Plan of Care (Signed)
  Problem: Education: Goal: Ability to demonstrate management of disease process will improve Outcome: Progressing Goal: Ability to verbalize understanding of medication therapies will improve Outcome: Progressing Goal: Individualized Educational Video(s) Outcome: Progressing   Problem: Activity: Goal: Capacity to carry out activities will improve Outcome: Progressing   Problem: Cardiac: Goal: Ability to achieve and maintain adequate cardiopulmonary perfusion will improve Outcome: Progressing   Problem: Activity: Goal: Ability to tolerate increased activity will improve Outcome: Progressing Goal: Will verbalize the importance of balancing activity with adequate rest periods Outcome: Progressing   Problem: Respiratory: Goal: Ability to maintain a clear airway will improve Outcome: Progressing Goal: Levels of oxygenation will improve Outcome: Progressing Goal: Ability to maintain adequate ventilation will improve Outcome: Progressing   Problem: Education: Goal: Knowledge of General Education information will improve Description: Including pain rating scale, medication(s)/side effects and non-pharmacologic comfort measures Outcome: Progressing   Problem: Health Behavior/Discharge Planning: Goal: Ability to manage health-related needs will improve Outcome: Progressing   Problem: Activity: Goal: Risk for activity intolerance will decrease Outcome: Progressing   Problem: Pain Managment: Goal: General experience of comfort will improve Outcome: Progressing   Problem: Elimination: Goal: Will not experience complications related to bowel motility Outcome: Progressing Goal: Will not experience complications related to urinary retention Outcome: Progressing   Problem: Safety: Goal: Ability to remain free from injury will improve Outcome: Progressing   Problem: Skin Integrity: Goal: Risk for impaired skin integrity will decrease Outcome: Progressing

## 2022-11-24 DIAGNOSIS — J180 Bronchopneumonia, unspecified organism: Secondary | ICD-10-CM | POA: Diagnosis not present

## 2022-11-24 DIAGNOSIS — I5033 Acute on chronic diastolic (congestive) heart failure: Secondary | ICD-10-CM | POA: Diagnosis not present

## 2022-11-24 DIAGNOSIS — J441 Chronic obstructive pulmonary disease with (acute) exacerbation: Secondary | ICD-10-CM | POA: Diagnosis not present

## 2022-11-24 DIAGNOSIS — J9601 Acute respiratory failure with hypoxia: Secondary | ICD-10-CM | POA: Diagnosis not present

## 2022-11-24 LAB — CULTURE, RESPIRATORY W GRAM STAIN

## 2022-11-24 LAB — BASIC METABOLIC PANEL
Anion gap: 9 (ref 5–15)
BUN: 22 mg/dL (ref 8–23)
CO2: 32 mmol/L (ref 22–32)
Calcium: 8.6 mg/dL — ABNORMAL LOW (ref 8.9–10.3)
Chloride: 93 mmol/L — ABNORMAL LOW (ref 98–111)
Creatinine, Ser: 0.71 mg/dL (ref 0.44–1.00)
GFR, Estimated: >60 mL/min (ref >60)
Glucose, Bld: 198 mg/dL — ABNORMAL HIGH (ref 70–99)
Potassium: 3.6 mmol/L (ref 3.5–5.1)
Sodium: 134 mmol/L — ABNORMAL LOW (ref 135–145)

## 2022-11-24 LAB — MAGNESIUM: Magnesium: 2.6 mg/dL — ABNORMAL HIGH (ref 1.7–2.4)

## 2022-11-24 MED ORDER — POTASSIUM CHLORIDE 20 MEQ PO PACK
40.0000 meq | PACK | Freq: Once | ORAL | Status: AC
  Administered 2022-11-24: 40 meq via ORAL
  Filled 2022-11-24: qty 2

## 2022-11-24 MED ORDER — METHYLPREDNISOLONE SODIUM SUCC 40 MG IJ SOLR
40.0000 mg | Freq: Every day | INTRAMUSCULAR | Status: DC
  Administered 2022-11-25: 40 mg via INTRAVENOUS
  Filled 2022-11-24: qty 1

## 2022-11-24 NOTE — Progress Notes (Signed)
Progress Note   Patient: Anita Harrell ZOX:[id number] DOB: 1950-05-21 DOA: 11/18/2022     6 DOS: the patient was seen and examined on 11/24/2022   Brief hospital course: Anita Harrell is a 72 y.o. female with medical history significant of atrial fibrillation, HFpEF, COPD, hypertension, hyperlipidemia presenting with acute respiratory failure with hypoxia, pneumonia, COPD and CHF exacerbations.   Patient had an increased cough, shortness of breath and wheezing. Upon arriving the hospital, patient had hypoxemia of 80% on room air, initially put on 2 L oxygen, then changed to heated high flow. Patient was found to have positive COVID, chest x-ray showed multifocal bronchopneumonia. Patient is started on IV steroids, Rocephin and Zithromax.  Patient was also started on Lasix.  9/11.  Patient started down to 4 L nasal cannula today. 9/12.  Patient was down to 3 L when I saw her this morning and will try to taper to off today. 9/13.  Patient down to 1 L of oxygen today.  Try to take off oxygen if able to.  Assessment and Plan: Acute respiratory failure with hypoxia (HCC) The patient does not wear oxygen at home.  Was on heated high flow nasal cannula earlier in the hospital course.  Now down to 1 L.  Try to taper off oxygen on a daily basis.  Bronchopneumonia Multi lobar COVID-19 pneumonia.  Patient also completed empiric Rocephin and Zithromax.  Continue Solu-Medrol.  COPD with acute exacerbation (HCC) Continue Solu-Medrol  (HFpEF) heart failure with preserved ejection fraction (HCC) Acute on chronic.  2D echo December 2023 with EF 50-55% Patient on IV Lasix.  Continue Toprol and spironolactone.  HTN (hypertension) Continue Toprol, spironolactone  Hyperlipidemia Continue statin  Hypokalemia Replace orally.  Obesity (BMI 30-39.9) BMI 34.37  Delirium Resolved  Metabolic acidosis Improved  Hypophosphatemia Replaced  Hyponatremia Resolved  Thrombocytopenia  (HCC) Appears chronic, likely lower with viral infection.  Atrial fibrillation, chronic (HCC) Continue home medications: Diltiazem, flecainide, Eliquis.   Former smoker Pt now vaping           Subjective: Patient feeling better.  Admitted with COVID-19 pneumonia.  Still with some cough.  Down to 1 L.  Physical Exam: Vitals:   11/23/22 2112 11/24/22 0017 11/24/22 0620 11/24/22 0916  BP: 129/63 (!) 146/71 (!) 123/59 103/71  Pulse: 76 74 65 72  Resp: 18 18 18  (!) 21  Temp: 97.8 F (36.6 C) 97.9 F (36.6 C) 97.9 F (36.6 C) 97.7 F (36.5 C)  TempSrc:    Axillary  SpO2: 92% 94% 93% (!) 89%  Weight:      Height:       Physical Exam HENT:     Head: Normocephalic.     Mouth/Throat:     Pharynx: No oropharyngeal exudate.  Eyes:     General: Lids are normal.     Conjunctiva/sclera: Conjunctivae normal.  Cardiovascular:     Rate and Rhythm: Normal rate and regular rhythm.     Heart sounds: Normal heart sounds, S1 normal and S2 normal.  Pulmonary:     Breath sounds: Examination of the right-lower field reveals decreased breath sounds. Examination of the left-lower field reveals decreased breath sounds. Decreased breath sounds present. No wheezing, rhonchi or rales.  Abdominal:     Palpations: Abdomen is soft.     Tenderness: There is no abdominal tenderness.  Musculoskeletal:     Right lower leg: No swelling.     Left lower leg: No swelling.  Skin:    General:  Skin is warm.     Findings: No rash.  Neurological:     Mental Status: She is alert and oriented to person, place, and time.     Data Reviewed: Sodium 134, potassium 3.6, creatinine 0.71  Family Communication: Spoke with husband at bedside  Disposition: Status is: Inpatient Remains inpatient appropriate because: Trying to get off oxygen prior to disposition.  Now down to 1 L.  Planned Discharge Destination: Home    Time spent: 27 minutes  Author: Alford Highland, MD 11/24/2022 2:57 PM  For on  call review [email or web address].

## 2022-11-24 NOTE — Progress Notes (Signed)
Mobility Specialist - Progress Note   Pre-mobility: SpO2(96) During mobility: SpO2(89) Post-mobility: SPO2(93)     11/24/22 1631  Mobility  Activity Ambulated with assistance in hallway  Level of Assistance Modified independent, requires aide device or extra time  Assistive Device Front wheel walker  Distance Ambulated (ft) 120 ft  Range of Motion/Exercises Active  Activity Response Tolerated well  Mobility Referral Yes  $Mobility charge 1 Mobility  Mobility Specialist Start Time (ACUTE ONLY) 1610  Mobility Specialist Stop Time (ACUTE ONLY) 1631  Mobility Specialist Time Calculation (min) (ACUTE ONLY) 21 min   Pt resting in recliner on 1L upon entry. Pt STS and ambulates to hallway around NS halfway and back to room ModI with RW. Pt gait is moderately stable. Pt returned to recliner and left with needs in reach.   Johnathan Hausen Mobility Specialist 11/24/22, 4:41 PM

## 2022-11-24 NOTE — Progress Notes (Signed)
[facility] HF Stewardship  PCP: Tower, Audrie Gallus, MD  PCP-Cardiologist: Julien Nordmann, MD  HPI: Anita Harrell is a 72 y.o. female with atrial fibrillation, HFpEF, COPD, hypertension, hyperlipidemia who presented with acute respiratory failure with hypoxia, pneumonia, COPD & CHF exacerbations. Found to have COVID-19 PNA and possible secondary bacterial pneumonia. Treated with steroids and antibiotics.   Pertinent cardiac history: TTE 08/2021 showed LVEF of 55-60% with LA dilation and mild MR. Negative exercise test in 12/2021. Long term monitor in 12/2021 showed continuous Afib. TTE 02/2022 showed LVEF 50-55%.   Pertinent Lab Values: Creatinine, Ser  Date Value Ref Range Status  11/24/2022 0.71 0.44 - 1.00 mg/dL Final   BUN  Date Value Ref Range Status  11/24/2022 22 8 - 23 mg/dL Final   Potassium  Date Value Ref Range Status  11/24/2022 3.6 3.5 - 5.1 mmol/L Final   Sodium  Date Value Ref Range Status  11/24/2022 134 (L) 135 - 145 mmol/L Final   B Natriuretic Peptide  Date Value Ref Range Status  11/18/2022 222.1 (H) 0.0 - 100.0 pg/mL Final    Comment:    Performed at [facility], [address] [place], [place] [address]   Magnesium  Date Value Ref Range Status  11/24/2022 2.6 (H) 1.7 - 2.4 mg/dL Final    Comment:    Performed at [facility], [address] [place], [place] [address]   Hgb A1c MFr Bld  Date Value Ref Range Status  06/07/2021 6.1 4.6 - 6.5 % Final    Comment:    Glycemic Control Guidelines for People with Diabetes:Non Diabetic:  <6%Goal of Therapy: <7%Additional Action Suggested:  >8%    TSH  Date Value Ref Range Status  06/07/2021 2.14 0.35 - 5.50 uIU/mL Final    Vital Signs: Temp:  [97.3 F (36.3 C)-98 F (36.7 C)] 97.9 F (36.6 C) (09/13 0620) Pulse Rate:  [64-76] 65 (09/13 0620) Cardiac Rhythm: Heart block;Bundle branch block (09/12 1943) Resp:  [17-20] 18 (09/13 0620) BP: (123-146)/(59-75) 123/59 (09/13 0620) SpO2:   [92 %-96 %] 93 % (09/13 0620)   Intake/Output Summary (Last 24 hours) at 11/24/2022 0738 Last data filed at 11/23/2022 1243 Gross per 24 hour  Intake 240 ml  Output --  Net 240 ml    Current Inpatient Medications:  -Furosemide 40 mg IV BID -Metoprolol succinate 50 mg daily -Spironolactone 25 mg daily  Prior to admission HF Medications:  -Furosemide 20 mg daily + potassium 30 meq daily -Metoprolol succinate 50 mg daily  Assessment: 1. Diastolic heart failure (LVEF 50-55%), due to unknown etiology. NYHA class IV symptoms.  -O2 requirement down. Urine output not well documented. No weight since 9/8. Creatinine/BUN stable.  -BP stable this AM. Potassium 3.6, will need 40 meq today. -Maintaining NSR on flecainide. HR 70s. Since flecainide is usually cautioned in structural heart disease, may be better candidate for Tikosyn in the future. Cardiomyopathy does not appear to be ischemic in nature, so may also be reasonable to continue flecainide as well. Defer to MD team. -Can consider starting SGLT2i for first line HFpEF treatement and to augment diuresis, however copays are high due to the Medicare coverage gap. Can consider patient assistance if initiated.  Plan: 1) Medication changes recommended at this time: -Maintain strict I/Os, daily weights, Mg >2, and K >4. -Can consider starting SGLT2i when stable from infection standpoint. May require patient assistance pending income if started.   2) Patient assistance: -Currently in donut hole, making [place]  copay $136.77 and [place] $143.55  3) Education: -To be completed prior to discharge.   Medication Assistance / Insurance Benefits Check:  Does the patient have prescription insurance?    Type of insurance plan:   Does the patient qualify for medication assistance through manufacturers or grants? Pending  Eligible grants and/or patient assistance programs: pending  Medication assistance applications in progress: pending   Medication assistance applications approved: pending  Outpatient Pharmacy:  Prior to admission outpatient pharmacy: [facility]     Thank you for involving pharmacy in this patient's care.  Enos Fling, PharmD, BCPS Phone - [phone] Clinical Pharmacist 11/24/2022 7:38 AM

## 2022-11-25 DIAGNOSIS — J441 Chronic obstructive pulmonary disease with (acute) exacerbation: Secondary | ICD-10-CM | POA: Diagnosis not present

## 2022-11-25 DIAGNOSIS — J9621 Acute and chronic respiratory failure with hypoxia: Secondary | ICD-10-CM | POA: Diagnosis not present

## 2022-11-25 DIAGNOSIS — I5033 Acute on chronic diastolic (congestive) heart failure: Secondary | ICD-10-CM | POA: Diagnosis not present

## 2022-11-25 DIAGNOSIS — J180 Bronchopneumonia, unspecified organism: Secondary | ICD-10-CM | POA: Diagnosis not present

## 2022-11-25 LAB — BASIC METABOLIC PANEL
Anion gap: 11 (ref 5–15)
BUN: 22 mg/dL (ref 8–23)
CO2: 32 mmol/L (ref 22–32)
Calcium: 8.7 mg/dL — ABNORMAL LOW (ref 8.9–10.3)
Chloride: 91 mmol/L — ABNORMAL LOW (ref 98–111)
Creatinine, Ser: 0.58 mg/dL (ref 0.44–1.00)
GFR, Estimated: >60 mL/min (ref >60)
Glucose, Bld: 120 mg/dL — ABNORMAL HIGH (ref 70–99)
Potassium: 4.3 mmol/L (ref 3.5–5.1)
Sodium: 134 mmol/L — ABNORMAL LOW (ref 135–145)

## 2022-11-25 MED ORDER — SPIRONOLACTONE 25 MG PO TABS: 25.0000 mg | ORAL_TABLET | Freq: Every day | ORAL | 0 refills | Status: DC

## 2022-11-25 MED ORDER — PREDNISONE 10 MG PO TABS: ORAL_TABLET | ORAL | 0 refills | Status: DC

## 2022-11-25 MED ORDER — DOXYCYCLINE HYCLATE 100 MG PO TABS: 100.0000 mg | ORAL_TABLET | Freq: Two times a day (BID) | ORAL | 0 refills | Status: AC

## 2022-11-25 MED ORDER — GUAIFENESIN-DM 100-10 MG/5ML PO SYRP: 5.0000 mL | ORAL_SOLUTION | ORAL | 0 refills | Status: DC | PRN

## 2022-11-25 NOTE — Discharge Summary (Signed)
Physician Discharge Summary   Patient: Anita Harrell MRN: [id number] DOB: [birth year]  Admit date:     11/18/2022  Discharge date: 11/25/22  Discharge Physician: Alford Highland   PCP: Judy Pimple, MD   Recommendations at discharge:   Follow-up PCP 5 days Follow-up Dr. Jayme Cloud 2 weeks  Discharge Diagnoses: Principal Problem:   Acute on chronic respiratory failure with hypoxemia (HCC) Active Problems:   Bronchopneumonia   COPD with acute exacerbation (HCC)   HTN (hypertension)   (HFpEF) heart failure with preserved ejection fraction (HCC)   Hyperlipidemia   Hypokalemia   Obesity (BMI 30-39.9)   Former smoker   COVID-19   Atrial fibrillation, chronic (HCC)   Thrombocytopenia (HCC)   Hyponatremia   Hypophosphatemia   Metabolic acidosis   Delirium    Hospital Course: Anita Harrell is a 72 y.o. female with medical history significant of atrial fibrillation, HFpEF, COPD, hypertension, hyperlipidemia presenting with acute respiratory failure with hypoxia, pneumonia, COPD and CHF exacerbations.   Patient had an increased cough, shortness of breath and wheezing. Upon arriving the hospital, patient had hypoxemia of 80% on room air, initially put on 2 L oxygen, then changed to heated high flow. Patient was found to have positive COVID, chest x-ray showed multifocal bronchopneumonia. Patient is started on IV steroids, Rocephin and Zithromax.  Patient was also started on Lasix.  9/11.  Patient started down to 4 L nasal cannula today. 9/12.  Patient was down to 3 L when I saw her this morning and will try to taper to off today. 9/13.  Patient down to 1 L of oxygen today.  Try to take off oxygen if able to. 9/14.  Patient's pulse ox dropped down to 84% with ambulation.  Patient feeling well enough to go home and will go home with oxygen.  Assessment and Plan: * Acute on chronic respiratory failure with hypoxemia (HCC) Patient improved quite a bit from being on heated high  flow nasal cannula and then taper down to 1 L.  Unable to come off oxygen.  When she ambulated she dropped down to 84% on room air will discharge home on 2 L of oxygen and follow-up with pulmonary in a few weeks.  Continue steroid taper.  Bronchopneumonia Multi lobar COVID-19 pneumonia.  Patient also completed empiric Rocephin and Zithromax.  Continue Solu-Medrol and switched over to a prednisone taper.  Sputum culture actually grew out rare Staph aureus.  Will give doxycycline 100 mg twice a day for 1 week upon going home.  COPD with acute exacerbation (HCC) Continue Solu-Medrol and switch over to a prednisone taper upon discharge.  (HFpEF) heart failure with preserved ejection fraction (HCC) Acute on chronic.  2D echo December 2023 with EF 50-55% Continue Toprol, spironolactone.  Can go back on Lasix orally as outpatient.  HTN (hypertension) Continue Toprol, spironolactone  Hyperlipidemia Continue statin  Hypokalemia Replaced.  Obesity (BMI 30-39.9) BMI 34.37  Delirium Resolved  Metabolic acidosis Improved  Hypophosphatemia Replaced  Hyponatremia Last sodium 1 point less than normal range.  Thrombocytopenia (HCC) Appears chronic, likely lower with viral infection.  Atrial fibrillation, chronic (HCC) Continue home medications: Toprol, flecainide, Eliquis.   Former smoker Need to stop vaping           Consultants: None Procedures performed: None Disposition: Home health Diet recommendation:  Cardiac diet DISCHARGE MEDICATION: Allergies as of 11/25/2022       Reactions   Abilify [aripiprazole]    Vision problem   Ambien [zolpidem]  Hallucinations Ms change    Codeine Nausea And Vomiting   REACTION: Nausea and vomiting Pt has tolerated vicodin & percocet in the past   Cymbalta [duloxetine Hcl]    More depressed/tearful    Lipitor [atorvastatin]    Muscle pain    Vesicare [solifenacin]    MS change   Wellbutrin [bupropion]          Medication List     STOP taking these medications    diltiazem 120 MG 24 hr capsule Commonly known as: CARDIZEM CD   potassium chloride 10 MEQ tablet Commonly known as: KLOR-CON       TAKE these medications    acetaminophen 500 MG tablet Commonly known as: TYLENOL Take 500 mg by mouth every 6 (six) hours as needed.   albuterol 108 (90 Base) MCG/ACT inhaler Commonly known as: VENTOLIN HFA Inhale 2 puffs into the lungs every 6 (six) hours as needed for wheezing or shortness of breath.   ALPRAZolam 0.25 MG tablet Commonly known as: XANAX Take by mouth.   cyclobenzaprine 10 MG tablet Commonly known as: FLEXERIL TAKE ONE HALF (1/2) TO ONE TABLET BY MOUTH AT BEDTIME AS NEEDED FOR MUSCLE SPASMS What changed: See the new instructions.   doxycycline 100 MG tablet Commonly known as: VIBRA-TABS Take 1 tablet (100 mg total) by mouth 2 (two) times daily for 7 days.   DULoxetine 60 MG capsule Commonly known as: CYMBALTA Take 60 mg by mouth daily.   Eliquis 5 MG Tabs tablet Generic drug: apixaban TAKE ONE TABLET BY MOUTH TWICE A DAY   flecainide 100 MG tablet Commonly known as: TAMBOCOR Take 1 tablet (100 mg total) by mouth 2 (two) times daily.   furosemide 20 MG tablet Commonly known as: LASIX Take 1 tablet (20 mg total) by mouth daily.   gabapentin 300 MG capsule Commonly known as: NEURONTIN Take 600 mg by mouth at bedtime.   guaiFENesin-dextromethorphan 100-10 MG/5ML syrup Commonly known as: ROBITUSSIN DM Take 5 mLs by mouth every 4 (four) hours as needed for cough (chest congestion).   hydrOXYzine 25 MG capsule Commonly known as: VISTARIL Take 25 mg by mouth 2 (two) times daily.   Melatonin 5 MG Caps Take by mouth.   metoprolol succinate 50 MG 24 hr tablet Commonly known as: TOPROL-XL TAKE ONE TABLET BY MOUTH TWICE A DAY WITH MEALS   mirabegron ER 25 MG Tb24 tablet Commonly known as: MYRBETRIQ Take 1 tablet (25 mg total) by mouth daily.   PARoxetine  10 MG tablet Commonly known as: PAXIL Take 1 tablet by mouth daily.   predniSONE 10 MG tablet Commonly known as: DELTASONE 4 tabs po day1; 3 tabs po day2,3; 2 tabs po day4,5; 1 tab po day6,7; 1/2 tab po day8,9   rosuvastatin 10 MG tablet Commonly known as: CRESTOR TAKE 1 TABLET BY MOUTH ONCE A DAY   spironolactone 25 MG tablet Commonly known as: ALDACTONE Take 1 tablet (25 mg total) by mouth daily. Start taking on: November 26, 2022   traMADol 50 MG tablet Commonly known as: ULTRAM TAKE ONE (1) TO TWO (2) TABLETS BY MOUTH EVERY EIGHT HOURS AS NEEDED FOR MODERATE PAIN   traZODone 50 MG tablet Commonly known as: DESYREL Take 50 mg by mouth at bedtime as needed.   Trelegy Ellipta 100-62.5-25 MCG/ACT Aepb Generic drug: Fluticasone-Umeclidin-Vilant Inhale 1 puff into the lungs daily.               Durable Medical Equipment  (From admission, onward)  Start     Ordered   11/25/22 1145  For home use only DME oxygen  Once       Question Answer Comment  Length of Need Lifetime   Mode or (Route) Nasal cannula   Liters per Minute 2   Frequency Continuous (stationary and portable oxygen unit needed)   Oxygen conserving device Yes   Oxygen delivery system Gas      11/25/22 1144            Follow-up Information     Tower, Audrie Gallus, MD Follow up in 5 day(s).   Specialties: Family Medicine, Radiology Contact information: [address] [place] [address] [phone]         Salena Saner, MD Follow up in 2 week(s).   Specialty: Pulmonary Disease Contact information: [address] [place] [address] [phone]                Discharge Exam: Ceasar Mons Weights   11/18/22 1324 11/19/22 0312  Weight: 86.2 kg 88 kg   Physical Exam HENT:     Head: Normocephalic.     Mouth/Throat:     Pharynx: No oropharyngeal exudate.  Eyes:     General: Lids are normal.     Conjunctiva/sclera: Conjunctivae normal.   Cardiovascular:     Rate and Rhythm: Normal rate and regular rhythm.     Heart sounds: Normal heart sounds, S1 normal and S2 normal.  Pulmonary:     Breath sounds: Examination of the right-lower field reveals decreased breath sounds. Examination of the left-lower field reveals decreased breath sounds. Decreased breath sounds present. No wheezing, rhonchi or rales.  Abdominal:     Palpations: Abdomen is soft.     Tenderness: There is no abdominal tenderness.  Musculoskeletal:     Right lower leg: No swelling.     Left lower leg: No swelling.  Skin:    General: Skin is warm.     Findings: No rash.  Neurological:     Mental Status: She is alert and oriented to person, place, and time.      Condition at discharge: stable  The results of significant diagnostics from this hospitalization (including imaging, microbiology, ancillary and laboratory) are listed below for reference.   Imaging Studies: CT Angio Chest Pulmonary Embolism (PE) W or WO Contrast  Result Date: 11/19/2022 CLINICAL DATA:  Shortness of breath, cough weakness EXAM: CT ANGIOGRAPHY CHEST WITH CONTRAST TECHNIQUE: Multidetector CT imaging of the chest was performed using the standard protocol during bolus administration of intravenous contrast. Multiplanar CT image reconstructions and MIPs were obtained to evaluate the vascular anatomy. RADIATION DOSE REDUCTION: This exam was performed according to the departmental dose-optimization program which includes automated exposure control, adjustment of the mA and/or kV according to patient size and/or use of iterative reconstruction technique. CONTRAST:  75mL OMNIPAQUE IOHEXOL 350 MG/ML SOLN COMPARISON:  Chest CT dated Jul 11, 2021; thyroid ultrasound dated May 7 FINDINGS: Cardiovascular: No evidence of pulmonary embolus. Mild cardiomegaly. No pericardial effusion. Moderate coronary artery calcifications. Normal caliber thoracic aorta with moderate atherosclerotic disease.  Mediastinum/Nodes: Small hiatal hernia. Prior left thyroidectomy. Multinodular right thyroid gland, unchanged when compared with the prior, correlate with prior thyroid ultrasound. Enlarged right hilar lymph nodes. Reference node measuring 13 mm in short axis on series 4, image 71. Lungs/Pleura: Central airways are patent. Patchy ground-glass opacities and septal thickening, most pronounced in the upper lungs. Right lower lobe masslike opacity measuring 2.8 x  2.3 cm on series 6, image 75. bilateral lower lobe atelectasis. No pleural effusion or pneumothorax. Upper Abdomen: No acute abnormality. Musculoskeletal: No chest wall abnormality. No acute or significant osseous findings. Review of the MIP images confirms the above findings. IMPRESSION: 1. No evidence of pulmonary embolus. 2. Right lower lobe masslike opacity, could be due to infection or due to primary lung neoplasm. Consider tissue sampling or short-term follow-up chest CT in 1-2 months based on clinical findings. 3. Enlarged right hilar lymph nodes, could be reactive or due to metastatic disease. 4. Patchy ground-glass opacities and septal thickening, most pronounced in the upper lungs, likely due to pulmonary edema. 5. Aortic Atherosclerosis (ICD10-I70.0). Electronically Signed   By: Allegra Lai M.D.   On: 11/19/2022 21:24   DG Chest Port 1 View  Result Date: 11/18/2022 CLINICAL DATA:  72 year old female with history of positive COVID test on Monday. Shortness of breath, cough and weakness. EXAM: PORTABLE CHEST 1 VIEW COMPARISON:  Chest x-ray 02/13/2022. FINDINGS: Widespread areas of interstitial prominence and peribronchial cuffing, along with patchy ill-defined opacities noted in the lungs bilaterally, most evident in the mid to lower lungs. Small left pleural effusion. No definite right pleural effusion. No pneumothorax. Pulmonary vasculature does not appear engorged. Heart size is mildly enlarged. Upper mediastinal contours are within normal  limits. IMPRESSION: 1. The appearance of the chest suggests multilobar bilateral bronchopneumonia (left-greater-than-right), as above. 2. Small left pleural effusion. 3. Mild cardiomegaly. Electronically Signed   By: Trudie Reed M.D.   On: 11/18/2022 08:20    Microbiology: Results for orders placed or performed during the hospital encounter of 11/18/22  Blood culture (routine x 2)     Status: None   Collection Time: 11/18/22  9:13 AM   Specimen: BLOOD  Result Value Ref Range Status   Specimen Description BLOOD BLOOD RIGHT ARM  Final   Special Requests   Final    BOTTLES DRAWN AEROBIC AND ANAEROBIC Blood Culture results may not be optimal due to an excessive volume of blood received in culture bottles   Culture   Final    NO GROWTH 5 DAYS Performed at [facility], [address] [place], [place] [address]    Report Status 11/23/2022 FINAL  Final  Respiratory (~20 pathogens) panel by PCR     Status: None   Collection Time: 11/18/22 11:02 AM   Specimen: Nasopharyngeal Swab; Respiratory  Result Value Ref Range Status   Adenovirus NOT DETECTED NOT DETECTED Final   Coronavirus 229E NOT DETECTED NOT DETECTED Final    Comment: (NOTE) The Coronavirus on the Respiratory Panel, DOES NOT test for the novel  Coronavirus (2019 nCoV)    Coronavirus HKU1 NOT DETECTED NOT DETECTED Final   Coronavirus NL63 NOT DETECTED NOT DETECTED Final   Coronavirus OC43 NOT DETECTED NOT DETECTED Final   Metapneumovirus NOT DETECTED NOT DETECTED Final   Rhinovirus / Enterovirus NOT DETECTED NOT DETECTED Final   Influenza A NOT DETECTED NOT DETECTED Final   Influenza B NOT DETECTED NOT DETECTED Final   Parainfluenza Virus 1 NOT DETECTED NOT DETECTED Final   Parainfluenza Virus 2 NOT DETECTED NOT DETECTED Final   Parainfluenza Virus 3 NOT DETECTED NOT DETECTED Final   Parainfluenza Virus 4 NOT DETECTED NOT DETECTED Final   Respiratory Syncytial Virus NOT DETECTED NOT DETECTED Final   Bordetella  pertussis NOT DETECTED NOT DETECTED Final   Bordetella Parapertussis NOT DETECTED NOT DETECTED Final   Chlamydophila pneumoniae NOT DETECTED NOT DETECTED Final  Mycoplasma pneumoniae NOT DETECTED NOT DETECTED Final    Comment: Performed at [facility], [address], [place], [place] [address]  Blood culture (routine x 2)     Status: None   Collection Time: 11/18/22  1:20 PM   Specimen: BLOOD  Result Value Ref Range Status   Specimen Description BLOOD BLOOD LEFT HAND  Final   Special Requests   Final    BOTTLES DRAWN AEROBIC AND ANAEROBIC Blood Culture results may not be optimal due to an excessive volume of blood received in culture bottles   Culture   Final    NO GROWTH 5 DAYS Performed at [facility], [address], [place], [place] [address]    Report Status 11/23/2022 FINAL  Final  Expectorated Sputum Assessment w Gram Stain, Rflx to Resp Cult     Status: None   Collection Time: 11/20/22  6:10 PM   Specimen: Sputum  Result Value Ref Range Status   Specimen Description SPUTUM  Final   Special Requests NONE  Final   Sputum evaluation   Final    THIS SPECIMEN IS ACCEPTABLE FOR SPUTUM CULTURE Performed at [facility], [address], [place], [place] [address]    Report Status 11/20/2022 FINAL  Final  Culture, Respiratory w Gram Stain     Status: None   Collection Time: 11/20/22  6:10 PM   Specimen: SPU  Result Value Ref Range Status   Specimen Description   Final    SPUTUM Performed at [facility], [address] [place], [place] [address]    Special Requests   Final    NONE Reflexed from [id number] Performed at [facility], [address] [place], [place] [address]    Gram Stain   Final    RARE WBC PRESENT, PREDOMINANTLY PMN RARE GRAM POSITIVE COCCI RARE GRAM POSITIVE RODS Performed at [facility], [address], [place], [place] [address]    Culture FEW STAPHYLOCOCCUS AUREUS  Final   Report Status  11/24/2022 FINAL  Final   Organism ID, Bacteria STAPHYLOCOCCUS AUREUS  Final      Susceptibility   Staphylococcus aureus - MIC*    CIPROFLOXACIN <=0.5 SENSITIVE Sensitive     ERYTHROMYCIN RESISTANT Resistant     GENTAMICIN <=0.5 SENSITIVE Sensitive     OXACILLIN 0.5 SENSITIVE Sensitive     TETRACYCLINE <=1 SENSITIVE Sensitive     VANCOMYCIN 1 SENSITIVE Sensitive     TRIMETH/SULFA <=10 SENSITIVE Sensitive     CLINDAMYCIN RESISTANT Resistant     RIFAMPIN <=0.5 SENSITIVE Sensitive     Inducible Clindamycin POSITIVE Resistant     LINEZOLID 2 SENSITIVE Sensitive     * FEW STAPHYLOCOCCUS AUREUS    Labs: CBC: Recent Labs  Lab 11/20/22 0430 11/21/22 0322 11/22/22 0724  WBC 19.1* 11.2* 14.8*  HGB 12.2 11.6* 12.1  HCT 36.3 33.8* 35.2*  MCV 85.0 84.1 84.8  PLT 141* 131* 142*   Basic Metabolic Panel: Recent Labs  Lab 11/19/22 0833 11/20/22 0430 11/21/22 0322 11/22/22 0451 11/24/22 0622 11/25/22 0550  NA 135 136 136 136 134* 134*  K 3.9 3.6 2.9* 4.3 3.6 4.3  CL 107 104 100 101 93* 91*  CO2 20* 24 26 25  32 32  GLUCOSE 202* 142* 159* 134* 198* 120*  BUN 17 20 27* 24* 22 22  CREATININE 0.55 0.55 0.63 0.59 0.71 0.58  CALCIUM 8.7* 8.8* 8.4* 8.5* 8.6* 8.7*  MG 2.2 2.1 2.2 2.6* 2.6*  --  PHOS 2.3* 2.4* 3.1  --   --   --    Liver Function Tests: No results for input(s): "AST", "ALT", "ALKPHOS", "BILITOT", "PROT", "ALBUMIN" in the last 168 hours. CBG: No results for input(s): "GLUCAP" in the last 168 hours.  Discharge time spent: greater than 30 minutes.  Signed: Alford Highland, MD [facility] 11/25/2022

## 2022-11-25 NOTE — TOC Initial Note (Signed)
Transition of Care [facility]) - Initial/Assessment Note    Patient Details  Name: Anita Harrell MRN: [id number] Date of Birth: 11/21/1950  Transition of Care [facility]) CM/SW Contact:    Liliana Cline, LCSW Phone Number: 11/25/2022, 12:21 PM  Clinical Narrative:                 Patient is on isolation. Called into room. Spoke to spouse. Patient is from home with spouse. Confirmed home address. PCP is Dr. Milinda Antis. Patient and spouse are agreeable to HHPT/OT and home oxygen being ordered. Home o2 referral made to [facility] with [facility] who is aware of DC today. Home Health referral made to [facility] with Frances Furbish who is aware of DC today.    Expected Discharge Plan: Home w Home Health Services Barriers to Discharge: Continued Medical Work up   Patient Goals and CMS Choice Patient states their goals for this hospitalization and ongoing recovery are:: home with home health CMS Medicare.gov Compare Post Acute Care list provided to:: Patient Represenative (must comment) Choice offered to / list presented to : Spouse      Expected Discharge Plan and Services       Living arrangements for the past 2 months: Single Family Home Expected Discharge Date: 11/25/22               DME Arranged: Oxygen DME Agency: [employer] Date DME Agency Contacted: 11/25/22   Representative spoke with at DME Agency: Selena Batten HH Arranged: PT, OT HH Agency: [facility] Date [facility] Agency Contacted: 11/25/22   Representative spoke with at [facility] Agency: Kandee Keen  Prior Living Arrangements/Services Living arrangements for the past 2 months: Single Family Home Lives with:: Spouse Patient language and need for interpreter reviewed:: Yes Do you feel safe going back to the place where you live?: Yes      Need for Family Participation in Patient Care: Yes (Comment) Care giver support system in place?: Yes (comment)   Criminal Activity/Legal Involvement Pertinent to Current Situation/Hospitalization: No - Comment as  needed  Activities of Daily Living Home Assistive Devices/Equipment: Eyeglasses ADL Screening (condition at time of admission) Patient's cognitive ability adequate to safely complete daily activities?: Yes Is the patient deaf or have difficulty hearing?: No Does the patient have difficulty seeing, even when wearing glasses/contacts?: No Does the patient have difficulty concentrating, remembering, or making decisions?: No Patient able to express need for assistance with ADLs?: Yes Does the patient have difficulty dressing or bathing?: Yes Independently performs ADLs?: Yes (appropriate for developmental age) Does the patient have difficulty walking or climbing stairs?: No Weakness of Legs: None Weakness of Arms/Hands: None  Permission Sought/Granted Permission sought to share information with : Facility Industrial/product designer granted to share information with : Yes, Verbal Permission Granted (by husband)              Emotional Assessment         Alcohol / Substance Use: Not Applicable Psych Involvement: No (comment)  Admission diagnosis:  Bronchopneumonia [J18.0] Acute respiratory failure with hypoxia (HCC) [J96.01] COVID-19 [U07.1] Patient Active Problem List   Diagnosis Date Noted   Delirium 11/21/2022   Hyponatremia 11/19/2022   Hypophosphatemia 11/19/2022   Metabolic acidosis 11/19/2022   Acute respiratory failure with hypoxia (HCC) 11/18/2022   Bronchopneumonia 11/18/2022   (HFpEF) heart failure with preserved ejection fraction (HCC) 11/18/2022   Atrial fibrillation, chronic (HCC) 11/18/2022   Thrombocytopenia (HCC) 11/18/2022   COVID-19 11/15/2022   Tobacco abuse 06/23/2022   Sundowning 05/17/2022  Hallucinations 05/17/2022   Polypharmacy 05/17/2022   General weakness 02/16/2022   Poor balance 02/16/2022   Falling 02/16/2022   COPD with acute exacerbation (HCC) 02/13/2022   Fall at home, initial encounter 02/13/2022   Hypokalemia 02/13/2022    HTN (hypertension) 02/13/2022   Acute on chronic diastolic CHF (congestive heart failure) (HCC) 02/13/2022   DDD (degenerative disc disease), lumbar 12/26/2021   Bilateral hip bursitis 10/05/2021   Atrial fibrillation with RVR (HCC) 10/05/2021   Cerumen impaction 06/21/2021   Estrogen deficiency 06/07/2021   Overactive bladder 10/17/2019   Medicare annual wellness visit, subsequent 01/16/2019   Thyroid nodule 05/01/2018   Electronic cigarette use 04/03/2018   Obesity (BMI 30-39.9) 04/03/2018   Lung mass 02/01/2018   Abnormal chest x-ray 01/29/2018   Smokers' cough (HCC) 01/01/2018   Microscopic hematuria 08/13/2012   Routine general medical examination at a health care facility 07/22/2012   Leukocytosis 03/30/2010   Prediabetes 02/21/2008   HYPERTENSION, BENIGN ESSENTIAL 01/22/2008   ALLERGIC RHINITIS 07/25/2007   Low back pain 06/26/2007   Personal history of goiter 07/27/2006   Hyperlipidemia 07/27/2006   PANIC DISORDER 07/27/2006   Former smoker 07/27/2006   Depression with anxiety 07/27/2006   Insomnia 07/27/2006   Personal history of colonic polyps 06/09/2004   PCP:  Judy Pimple, MD Pharmacy:   [facility] - [place], [place] - [address] [place] [address] Phone: [phone] Fax: [phone]  [id number] - [place], [place] - [address] AT [facility] [address] [facility] [address] & [address] [place] [address] Phone: [phone] Fax: [phone]     Social Determinants of Health (SDOH) Social History: SDOH Screenings   Food Insecurity: No Food Insecurity (11/18/2022)  Housing: Low Risk  (11/18/2022)  Transportation Needs: No Transportation Needs (11/18/2022)  Utilities: Not At Risk (11/18/2022)  Alcohol Screen: Low Risk  (05/18/2022)  Depression (PHQ2-9): Low Risk  (05/18/2022)  Recent Concern: Depression (PHQ2-9) - High Risk (05/17/2022)  Financial Resource Strain: Low Risk  (05/18/2022)  Physical  Activity: Inactive (05/18/2022)  Social Connections: Moderately Integrated (05/18/2022)  Stress: Stress Concern Present (05/18/2022)  Tobacco Use: Medium Risk (11/18/2022)   SDOH Interventions:     Readmission Risk Interventions     No data to display

## 2022-11-25 NOTE — Progress Notes (Signed)
SATURATION QUALIFICATIONS: (This note is used to comply with regulatory documentation for home oxygen)  Patient Saturations on Room Air at Rest = 95%  Patient Saturations on Room Air while Ambulating = 84%  Patient Saturations on 2 Liters of oxygen while Ambulating = 92%

## 2022-11-25 NOTE — Plan of Care (Signed)
  Problem: Education: Goal: Ability to demonstrate management of disease process will improve Outcome: Progressing Goal: Ability to verbalize understanding of medication therapies will improve Outcome: Progressing Goal: Individualized Educational Video(s) Outcome: Progressing   Problem: Activity: Goal: Capacity to carry out activities will improve Outcome: Progressing   Problem: Cardiac: Goal: Ability to achieve and maintain adequate cardiopulmonary perfusion will improve Outcome: Progressing   Problem: Activity: Goal: Ability to tolerate increased activity will improve Outcome: Progressing Goal: Will verbalize the importance of balancing activity with adequate rest periods Outcome: Progressing   Problem: Respiratory: Goal: Ability to maintain a clear airway will improve Outcome: Progressing Goal: Levels of oxygenation will improve Outcome: Progressing Goal: Ability to maintain adequate ventilation will improve Outcome: Progressing   Problem: Education: Goal: Knowledge of General Education information will improve Description: Including pain rating scale, medication(s)/side effects and non-pharmacologic comfort measures Outcome: Progressing   Problem: Respiratory: Goal: Ability to maintain adequate ventilation will improve Outcome: Progressing Goal: Ability to maintain a clear airway will improve Outcome: Progressing   Problem: Health Behavior/Discharge Planning: Goal: Ability to manage health-related needs will improve Outcome: Progressing   Problem: Nutrition: Goal: Adequate nutrition will be maintained Outcome: Progressing   Problem: Activity: Goal: Risk for activity intolerance will decrease Outcome: Progressing   Problem: Coping: Goal: Level of anxiety will decrease Outcome: Progressing   Problem: Elimination: Goal: Will not experience complications related to bowel motility Outcome: Progressing Goal: Will not experience complications related to  urinary retention Outcome: Progressing   Problem: Skin Integrity: Goal: Risk for impaired skin integrity will decrease Outcome: Progressing

## 2022-11-25 NOTE — Assessment & Plan Note (Signed)
Patient improved quite a bit from being on heated high flow nasal cannula and then taper down to 1 L.  Unable to come off oxygen.  When she ambulated she dropped down to 84% on room air will discharge home on 2 L of oxygen and follow-up with pulmonary in a few weeks.  Continue steroid taper.

## 2022-11-27 ENCOUNTER — Telehealth: Payer: Self-pay | Admitting: *Deleted

## 2022-11-27 NOTE — Transitions of Care (Post Inpatient/ED Visit) (Signed)
11/27/2022  Name: Anita Harrell MRN: [id number] DOB: Jul 23, 1950  Today's TOC FU Call Status: Today's TOC FU Call Status:: Successful TOC FU Call Completed TOC FU Call Complete Date: 11/27/22 Patient's Name and Date of Birth confirmed.  Transition Care Management Follow-up Telephone Call Date of Discharge: 11/25/22 Discharge Facility: [facility]) Type of Discharge: Inpatient Admission Primary Inpatient Discharge Diagnosis:: Acute on chronic respiratory failure with hypoxemia How have you been since you were released from the hospital?: Better Any questions or concerns?: No  Items Reviewed: Did you receive and understand the discharge instructions provided?: Yes Medications obtained,verified, and reconciled?: Yes (Medications Reviewed) Any new allergies since your discharge?: No Dietary orders reviewed?: No Do you have support at home?: Yes People in Home: spouse Name of Support/Comfort Primary Source: jerry  Medications Reviewed Today: Medications Reviewed Today     Reviewed by Luella Cook, RN (Case Manager) on 11/27/22 at 1220  Med List Status: <None>   Medication Order Taking? Sig Documenting Provider Last Dose Status Informant  acetaminophen (TYLENOL) 500 MG tablet [id number] Yes Take 500 mg by mouth every 6 (six) hours as needed. [provider] Taking Active Self, Spouse/Significant Other, Pharmacy Records  albuterol (VENTOLIN HFA) 108 (90 Base) MCG/ACT inhaler [id number] Yes Inhale 2 puffs into the lungs every 6 (six) hours as needed for wheezing or shortness of breath. Alford Highland, MD Taking Active Self, Spouse/Significant Other, Pharmacy Records  ALPRAZolam Prudy Feeler) 0.25 MG tablet [id number] Yes Take by mouth. [provider] Taking Active Self, Spouse/Significant Other, Pharmacy Records  cyclobenzaprine (FLEXERIL) 10 MG tablet [id number] Yes TAKE ONE HALF (1/2) TO ONE TABLET BY MOUTH AT BEDTIME AS NEEDED FOR MUSCLE  SPASMS Tower, Audrie Gallus, MD Taking Active   doxycycline (VIBRA-TABS) 100 MG tablet [id number] Yes Take 1 tablet (100 mg total) by mouth 2 (two) times daily for 7 days. Alford Highland, MD Taking Active   DULoxetine (CYMBALTA) 60 MG capsule [id number] Yes Take 60 mg by mouth daily. [provider] Taking Active Self, Spouse/Significant Other, Pharmacy Records  ELIQUIS 5 MG TABS tablet [id number] Yes TAKE ONE TABLET BY MOUTH TWICE A DAY Gollan, Tollie Pizza, MD Taking Active Self, Spouse/Significant Other, Pharmacy Records  flecainide (TAMBOCOR) 100 MG tablet [id number] Yes Take 1 tablet (100 mg total) by mouth 2 (two) times daily. Antonieta Iba, MD Taking Active Self, Spouse/Significant Other, Pharmacy Records  Fluticasone-Umeclidin-[facility] ELLIPTA) 100-62.5-25 MCG/ACT AEPB [id number] Yes Inhale 1 puff into the lungs daily. Salena Saner, MD Taking Active Self, Spouse/Significant Other, Pharmacy Records  furosemide (LASIX) 20 MG tablet [id number] Yes Take 1 tablet (20 mg total) by mouth daily. Sondra Barges, PA-C Taking Active Self, Spouse/Significant Other, Pharmacy Records  gabapentin (NEURONTIN) 300 MG capsule [id number] Yes Take 600 mg by mouth at bedtime. [provider] Taking Active Self, Spouse/Significant Other, Pharmacy Records  guaiFENesin-dextromethorphan [facility] DM) 100-10 MG/5ML syrup [id number] Yes Take 5 mLs by mouth every 4 (four) hours as needed for cough (chest congestion). Alford Highland, MD Taking Active   hydrOXYzine (VISTARIL) 25 MG capsule [id number] Yes Take 25 mg by mouth 2 (two) times daily. Doles-Johnson, Teah, NP Taking Active Self, Spouse/Significant Other, Pharmacy Records           Med Note [facility]   Thu Jun 01, 2022  9:12 AM)    Melatonin 5 MG CAPS [id number] Yes Take by mouth. [provider] Taking Active Self, Spouse/Significant Other, Pharmacy Records  metoprolol succinate (TOPROL-XL) 50 MG 24  hr tablet [id number]  Yes TAKE ONE TABLET BY MOUTH TWICE A DAY WITH MEALS Gollan, Tollie Pizza, MD Taking Active Self, Spouse/Significant Other, Pharmacy Records  mirabegron ER (MYRBETRIQ) 25 MG TB24 tablet [id number] Yes Take 1 tablet (25 mg total) by mouth daily. Tower, Audrie Gallus, MD Taking Active Self, Spouse/Significant Other, Pharmacy Records  PARoxetine (PAXIL) 10 MG tablet [id number] Yes Take 1 tablet by mouth daily. Doles-Johnson, Teah, NP Taking Active Self, Spouse/Significant Other, Pharmacy Records  predniSONE (DELTASONE) 10 MG tablet [id number] Yes 4 tabs po day1; 3 tabs po day2,3; 2 tabs po day4,5; 1 tab po day6,7; 1/2 tab po day8,9 Alford Highland, MD Taking Active   rosuvastatin (CRESTOR) 10 MG tablet [id number] Yes TAKE 1 TABLET BY MOUTH ONCE A DAY Tower, Audrie Gallus, MD Taking Active Self, Spouse/Significant Other, Pharmacy Records  spironolactone (ALDACTONE) 25 MG tablet [id number] Yes Take 1 tablet (25 mg total) by mouth daily. Alford Highland, MD Taking Active   traMADol (ULTRAM) 50 MG tablet [id number] Yes TAKE ONE (1) TO TWO (2) TABLETS BY MOUTH EVERY EIGHT HOURS AS NEEDED FOR MODERATE PAIN Tower, Audrie Gallus, MD Taking Active Self, Spouse/Significant Other, Pharmacy Records  traZODone (DESYREL) 50 MG tablet [id number] Yes Take 50 mg by mouth at bedtime as needed. [provider] Taking Active Self, Spouse/Significant Other, Pharmacy Records            Home Care and Equipment/Supplies: Were Home Health Services Ordered?: Yes Name of Home Health Agency:: [facility] Has Agency set up a time to come to your home?: No EMR reviewed for Home Health Orders: Orders present/patient has not received call (refer to CM for follow-up) (RN gave phone number to spouse to follow up) Any new equipment or medical supplies ordered?: Yes Name of Medical supply agency?: [facility]) Were you able to get the equipment/medical supplies?: Yes Do you have any questions related to the use of the equipment/supplies?:  No  Functional Questionnaire: Do you need assistance with bathing/showering or dressing?: Yes Do you need assistance with meal preparation?: Yes Do you need assistance with eating?: No Do you need assistance with getting out of bed/getting out of a chair/moving?: No (using walker) Do you have difficulty managing or taking your medications?: No  Follow up appointments reviewed: PCP Follow-up appointment confirmed?: Yes Date of PCP follow-up appointment?: 12/04/22 Follow-up Provider: Dr Idamae Schuller [facility] Follow-up appointment confirmed?: Yes Date of Specialist follow-up appointment?: 12/14/22 Follow-Up Specialty Provider:: Dr Mariella Saa [id number], Clarisa Kindred [phone] Do you need transportation to your follow-up appointment?: No Do you understand care options if your condition(s) worsen?: Yes-patient verbalized understanding  SDOH Interventions Today    Flowsheet Row Most Recent Value  SDOH Interventions   Food Insecurity Interventions Intervention Not Indicated  Housing Interventions Intervention Not Indicated  Transportation Interventions Intervention Not Indicated      Interventions Today    Flowsheet Row Most Recent Value  General Interventions   General Interventions Discussed/Reviewed General Interventions Discussed, General Interventions Reviewed, Doctor Visits  Exercise Interventions   Exercise Discussed/Reviewed Exercise Discussed  Kaylyn Lim is to start pt/ot. Patien is up ambulating with walker in house now]  Mental Health Interventions   Mental Health Discussed/Reviewed Mental Health Discussed  [pt has upcoming behavioral health visit]  Pharmacy Interventions   Pharmacy Dicussed/Reviewed Pharmacy Topics Discussed, Pharmacy Topics Reviewed      TOC Interventions Today    Flowsheet Row Most Recent Value  TOC Interventions   TOC Interventions Discussed/Reviewed TOC Interventions Discussed, TOC Interventions Reviewed, Arranged PCP  follow up within 7  days/Care Guide scheduled       Gean Maidens BSN RN [facility] [phone]

## 2022-11-29 DIAGNOSIS — E785 Hyperlipidemia, unspecified: Secondary | ICD-10-CM | POA: Diagnosis not present

## 2022-11-29 DIAGNOSIS — Z6834 Body mass index (BMI) 34.0-34.9, adult: Secondary | ICD-10-CM | POA: Diagnosis not present

## 2022-11-29 DIAGNOSIS — Z9181 History of falling: Secondary | ICD-10-CM | POA: Diagnosis not present

## 2022-11-29 DIAGNOSIS — I482 Chronic atrial fibrillation, unspecified: Secondary | ICD-10-CM | POA: Diagnosis not present

## 2022-11-29 DIAGNOSIS — Z87891 Personal history of nicotine dependence: Secondary | ICD-10-CM | POA: Diagnosis not present

## 2022-11-29 DIAGNOSIS — J441 Chronic obstructive pulmonary disease with (acute) exacerbation: Secondary | ICD-10-CM | POA: Diagnosis not present

## 2022-11-29 DIAGNOSIS — E871 Hypo-osmolality and hyponatremia: Secondary | ICD-10-CM | POA: Diagnosis not present

## 2022-11-29 DIAGNOSIS — J9621 Acute and chronic respiratory failure with hypoxia: Secondary | ICD-10-CM | POA: Diagnosis not present

## 2022-11-29 DIAGNOSIS — Z9981 Dependence on supplemental oxygen: Secondary | ICD-10-CM | POA: Diagnosis not present

## 2022-11-29 DIAGNOSIS — J440 Chronic obstructive pulmonary disease with acute lower respiratory infection: Secondary | ICD-10-CM | POA: Diagnosis not present

## 2022-11-29 DIAGNOSIS — J1282 Pneumonia due to coronavirus disease 2019: Secondary | ICD-10-CM | POA: Diagnosis not present

## 2022-11-29 DIAGNOSIS — Z7951 Long term (current) use of inhaled steroids: Secondary | ICD-10-CM | POA: Diagnosis not present

## 2022-11-29 DIAGNOSIS — I5033 Acute on chronic diastolic (congestive) heart failure: Secondary | ICD-10-CM | POA: Diagnosis not present

## 2022-11-29 DIAGNOSIS — Z7952 Long term (current) use of systemic steroids: Secondary | ICD-10-CM | POA: Diagnosis not present

## 2022-11-29 DIAGNOSIS — Z7901 Long term (current) use of anticoagulants: Secondary | ICD-10-CM | POA: Diagnosis not present

## 2022-11-29 DIAGNOSIS — E876 Hypokalemia: Secondary | ICD-10-CM | POA: Diagnosis not present

## 2022-11-29 DIAGNOSIS — J15211 Pneumonia due to Methicillin susceptible Staphylococcus aureus: Secondary | ICD-10-CM | POA: Diagnosis not present

## 2022-11-29 DIAGNOSIS — U071 COVID-19: Secondary | ICD-10-CM | POA: Diagnosis not present

## 2022-11-29 DIAGNOSIS — D696 Thrombocytopenia, unspecified: Secondary | ICD-10-CM | POA: Diagnosis not present

## 2022-11-29 DIAGNOSIS — I110 Hypertensive heart disease with heart failure: Secondary | ICD-10-CM | POA: Diagnosis not present

## 2022-11-29 DIAGNOSIS — I700 Atherosclerosis of aorta: Secondary | ICD-10-CM | POA: Diagnosis not present

## 2022-11-29 DIAGNOSIS — E669 Obesity, unspecified: Secondary | ICD-10-CM | POA: Diagnosis not present

## 2022-11-30 DIAGNOSIS — J440 Chronic obstructive pulmonary disease with acute lower respiratory infection: Secondary | ICD-10-CM | POA: Diagnosis not present

## 2022-11-30 DIAGNOSIS — J1282 Pneumonia due to coronavirus disease 2019: Secondary | ICD-10-CM | POA: Diagnosis not present

## 2022-11-30 DIAGNOSIS — J441 Chronic obstructive pulmonary disease with (acute) exacerbation: Secondary | ICD-10-CM | POA: Diagnosis not present

## 2022-11-30 DIAGNOSIS — U071 COVID-19: Secondary | ICD-10-CM | POA: Diagnosis not present

## 2022-11-30 DIAGNOSIS — J9621 Acute and chronic respiratory failure with hypoxia: Secondary | ICD-10-CM | POA: Diagnosis not present

## 2022-11-30 DIAGNOSIS — J15211 Pneumonia due to Methicillin susceptible Staphylococcus aureus: Secondary | ICD-10-CM | POA: Diagnosis not present

## 2022-12-04 ENCOUNTER — Ambulatory Visit (INDEPENDENT_AMBULATORY_CARE_PROVIDER_SITE_OTHER): Payer: Medicare Other | Admitting: Family Medicine

## 2022-12-04 ENCOUNTER — Encounter: Payer: Self-pay | Admitting: Family Medicine

## 2022-12-04 VITALS — BP 128/65 | HR 62 | Temp 97.8°F | Ht 63.0 in | Wt 180.4 lb

## 2022-12-04 DIAGNOSIS — U071 COVID-19: Secondary | ICD-10-CM

## 2022-12-04 DIAGNOSIS — J441 Chronic obstructive pulmonary disease with (acute) exacerbation: Secondary | ICD-10-CM

## 2022-12-04 DIAGNOSIS — I5033 Acute on chronic diastolic (congestive) heart failure: Secondary | ICD-10-CM | POA: Diagnosis not present

## 2022-12-04 DIAGNOSIS — J180 Bronchopneumonia, unspecified organism: Secondary | ICD-10-CM | POA: Diagnosis not present

## 2022-12-04 DIAGNOSIS — J440 Chronic obstructive pulmonary disease with acute lower respiratory infection: Secondary | ICD-10-CM | POA: Diagnosis not present

## 2022-12-04 DIAGNOSIS — J9621 Acute and chronic respiratory failure with hypoxia: Secondary | ICD-10-CM

## 2022-12-04 DIAGNOSIS — I10 Essential (primary) hypertension: Secondary | ICD-10-CM

## 2022-12-04 DIAGNOSIS — J15211 Pneumonia due to Methicillin susceptible Staphylococcus aureus: Secondary | ICD-10-CM | POA: Diagnosis not present

## 2022-12-04 DIAGNOSIS — Z87891 Personal history of nicotine dependence: Secondary | ICD-10-CM | POA: Diagnosis not present

## 2022-12-04 DIAGNOSIS — J1282 Pneumonia due to coronavirus disease 2019: Secondary | ICD-10-CM | POA: Diagnosis not present

## 2022-12-04 NOTE — Assessment & Plan Note (Signed)
From covid pna with copd  Reviewed hospital records, lab results and studies in detail    Resolved  Has 02 at home for prn  ;pulm follow up planned

## 2022-12-04 NOTE — Assessment & Plan Note (Signed)
Reviewed hospital records, lab results and studies in detail  In setting of covid pna  Much improved  02 sat 94% on RA today   Has HH coming  Reassuring exam  Follow up with pulm early oct  Call back and Er precautions noted in detail today

## 2022-12-04 NOTE — Assessment & Plan Note (Signed)
Clinically improved Covid  Reviewed hospital records, lab results and studies in detail    Finished steroids and antibiotic  For pulm follow up early oct

## 2022-12-04 NOTE — Assessment & Plan Note (Signed)
BP: 128/65  On toprol and spironolactone  She was instructed to hold diltiazem until cardiology follow up   Reviewed hospital records, lab results and studies in detail

## 2022-12-04 NOTE — Progress Notes (Signed)
Subjective:    Patient ID: Anita Harrell, female    DOB: January 29, 1951, 72 y.o.   MRN: [id number]  HPI  Wt Readings from Last 3 Encounters:  12/04/22 180 lb 6 oz (81.8 kg)  11/19/22 194 lb 0.1 oz (88 kg)  11/15/22 193 lb (87.5 kg)   31.95 kg/m  Vitals:   12/04/22 1355 12/04/22 1417  BP: (!) 146/90 128/65  Pulse: 62   Temp: 97.8 F (36.6 C)   SpO2: 94%      Pt presents for follow up hosp from 9/7 to 9/14 for bronchopneumonia  (covid) with acute resp failure with hypoxemia (in setting of COPD) and CHF exacerbation   Hosp course as follows:    Hospital Course: JAMEISHA CANION is a 72 y.o. female with medical history significant of atrial fibrillation, HFpEF, COPD, hypertension, hyperlipidemia presenting with acute respiratory failure with hypoxia, pneumonia, COPD and CHF exacerbations.   Patient had an increased cough, shortness of breath and wheezing. Upon arriving the hospital, patient had hypoxemia of 80% on room air, initially put on 2 L oxygen, then changed to heated high flow. Patient was found to have positive COVID, chest x-ray showed multifocal bronchopneumonia. Patient is started on IV steroids, Rocephin and Zithromax.  Patient was also started on Lasix.   9/11.  Patient started down to 4 L nasal cannula today. 9/12.  Patient was down to 3 L when I saw her this morning and will try to taper to off today. 9/13.  Patient down to 1 L of oxygen today.  Try to take off oxygen if able to. 9/14.  Patient's pulse ox dropped down to 84% with ambulation.  Patient feeling well enough to go home and will go home with oxygen.   Assessment and Plan: * Acute on chronic respiratory failure with hypoxemia (HCC) Patient improved quite a bit from being on heated high flow nasal cannula and then taper down to 1 L.  Unable to come off oxygen.  When she ambulated she dropped down to 84% on room air will discharge home on 2 L of oxygen and follow-up with pulmonary in a few weeks.   Continue steroid taper.   Bronchopneumonia Multi lobar COVID-19 pneumonia.  Patient also completed empiric Rocephin and Zithromax.  Continue Solu-Medrol and switched over to a prednisone taper.  Sputum culture actually grew out rare Staph aureus.  Will give doxycycline 100 mg twice a day for 1 week upon going home.   COPD with acute exacerbation (HCC) Continue Solu-Medrol and switch over to a prednisone taper upon discharge.  Done with antibiotic Done with steroid    (HFpEF) heart failure with preserved ejection fraction (HCC) Acute on chronic.  2D echo December 2023 with EF 50-55% Continue Toprol, spironolactone.  Can go back on Lasix orally as outpatient.   HTN (hypertension) Continue Toprol, spironolactone   Hyperlipidemia Continue statin   Hypokalemia Replaced.   Obesity (BMI 30-39.9) BMI 34.37   Delirium Resolved   Metabolic acidosis Improved   Hypophosphatemia Replaced   Hyponatremia Last sodium 1 point less than normal range.   Thrombocytopenia (HCC) Appears chronic, likely lower with viral infection.   Atrial fibrillation, chronic (HCC) Continue home medications: Toprol, flecainide, Eliquis.     Former smoker Need to stop vaping She is done with it and stopped entirely !      Pt was instructed to stop her diltiazem and Klor con  Was dc on 02 for pulm follow up  Has not needed the  02 much  Can move around much     Blood culture noted neg for 5 days  Lab Results  Component Value Date   NA 134 (L) 11/25/2022   K 4.3 11/25/2022   CO2 32 11/25/2022   GLUCOSE 120 (H) 11/25/2022   BUN 22 11/25/2022   CREATININE 0.58 11/25/2022   CALCIUM 8.7 (L) 11/25/2022   GFR 87.73 06/07/2021   GFRNONAA >60 11/25/2022   Lab Results  Component Value Date   ALT 21 11/18/2022   AST 30 11/18/2022   ALKPHOS 93 11/18/2022   BILITOT 0.4 11/18/2022   Lab Results  Component Value Date   WBC 14.8 (H) 11/22/2022   HGB 12.1 11/22/2022   HCT 35.2 (L)  11/22/2022   MCV 84.8 11/22/2022   PLT 142 (L) 11/22/2022   Has cardiology follow up on 9/25 Pulm follow up 10/3  HH coming out for PT soon    Patient Active Problem List   Diagnosis Date Noted   Delirium 11/21/2022   Hyponatremia 11/19/2022   Hypophosphatemia 11/19/2022   Metabolic acidosis 11/19/2022   Bronchopneumonia 11/18/2022   (HFpEF) heart failure with preserved ejection fraction (HCC) 11/18/2022   Atrial fibrillation, chronic (HCC) 11/18/2022   Thrombocytopenia (HCC) 11/18/2022   COVID-19 11/15/2022   Sundowning 05/17/2022   Hallucinations 05/17/2022   Polypharmacy 05/17/2022   General weakness 02/16/2022   Poor balance 02/16/2022   Falling 02/16/2022   COPD with acute exacerbation (HCC) 02/13/2022   Fall at home, initial encounter 02/13/2022   Hypokalemia 02/13/2022   HTN (hypertension) 02/13/2022   Acute on chronic diastolic CHF (congestive heart failure) (HCC) 02/13/2022   DDD (degenerative disc disease), lumbar 12/26/2021   Bilateral hip bursitis 10/05/2021   Atrial fibrillation with RVR (HCC) 10/05/2021   Cerumen impaction 06/21/2021   Estrogen deficiency 06/07/2021   Overactive bladder 10/17/2019   Medicare annual wellness visit, subsequent 01/16/2019   Thyroid nodule 05/01/2018   Electronic cigarette use 04/03/2018   Obesity (BMI 30-39.9) 04/03/2018   Lung mass 02/01/2018   Abnormal chest x-ray 01/29/2018   Smokers' cough (HCC) 01/01/2018   Microscopic hematuria 08/13/2012   Routine general medical examination at a health care facility 07/22/2012   Leukocytosis 03/30/2010   Prediabetes 02/21/2008   HYPERTENSION, BENIGN ESSENTIAL 01/22/2008   ALLERGIC RHINITIS 07/25/2007   Low back pain 06/26/2007   Personal history of goiter 07/27/2006   Hyperlipidemia 07/27/2006   PANIC DISORDER 07/27/2006   Former smoker 07/27/2006   Depression with anxiety 07/27/2006   Insomnia 07/27/2006   Personal history of colonic polyps 06/09/2004   Past Medical  History:  Diagnosis Date   Allergy    allergic rhinitis   Anxiety    Arrhythmia    atrial fibrillation   Back pain    Chest pain    CHF (congestive heart failure) (HCC)    COPD (chronic obstructive pulmonary disease) (HCC)    Depression    Epigastric pain    Goiter    History of tobacco abuse    Hyperglycemia    mild   Hyperlipidemia    Hypertension    Insomnia    Hx of   Labile blood pressure    Panic disorder    History of   Personal history of colonic polyps 06/09/2004   SVD (spontaneous vaginal delivery)    x 2   Past Surgical History:  Procedure Laterality Date   ABDOMINAL HYSTERECTOMY N/A 09/24/2012   Procedure: HYSTERECTOMY ABDOMINAL;  Surgeon: Miguel Aschoff, MD;  Location:  [facility];  Service: Gynecology;  Laterality: N/A;   BREAST BIOPSY Right 2016   CARDIOVERSION N/A 02/09/2022   Procedure: CARDIOVERSION;  Surgeon: Antonieta Iba, MD;  Location: [facility];  Service: Cardiovascular;  Laterality: N/A;   CARDIOVERSION N/A 03/02/2022   Procedure: CARDIOVERSION;  Surgeon: Antonieta Iba, MD;  Location: [facility];  Service: Cardiovascular;  Laterality: N/A;   COLONOSCOPY     COLONOSCOPY  2017   ELBOW SURGERY  2004   EYE SURGERY     bilateral lasik   SALPINGOOPHORECTOMY Bilateral 09/24/2012   Procedure: SALPINGO OOPHORECTOMY;  Surgeon: Miguel Aschoff, MD;  Location: [facility];  Service: Gynecology;  Laterality: Bilateral;   THYROID SURGERY     B9 massess   WART FULGURATION N/A 09/24/2012   Procedure: FULGURATION VAGINAL WART;  Surgeon: Miguel Aschoff, MD;  Location: [facility];  Service: Gynecology;  Laterality: N/A;   WISDOM TOOTH EXTRACTION     Social History   Tobacco Use   Smoking status: Former    Current packs/day: 0.00    Types: E-cigarettes, Cigarettes    Quit date: 2011    Years since quitting: 13.7   Smokeless tobacco: Never   Tobacco comments:    She is vaping, former smoker    Advertising account planner   Vaping status: Every Day  Substance Use Topics   Alcohol use: No     Alcohol/week: 0.0 standard drinks of alcohol   Drug use: No   Family History  Problem Relation Age of Onset   Hypertension Mother    Stroke Mother    Alcohol abuse Father    Cancer Father        lung CA   Heart disease Father        CHF   Cancer Sister        breast CA   Heart disease Sister        CHF from chemotx also has defib   Breast cancer Sister    Colon cancer Neg Hx    Esophageal cancer Neg Hx    Rectal cancer Neg Hx    Stomach cancer Neg Hx    Allergies  Allergen Reactions   Abilify [Aripiprazole]     Vision problem   Ambien [Zolpidem]     Hallucinations Ms change    Codeine Nausea And Vomiting    REACTION: Nausea and vomiting Pt has tolerated vicodin & percocet in the past   Cymbalta [Duloxetine Hcl]     More depressed/tearful    Lipitor [Atorvastatin]     Muscle pain    Vesicare [Solifenacin]     MS change   Wellbutrin [Bupropion]    Current Outpatient Medications on File Prior to Visit  Medication Sig Dispense Refill   acetaminophen (TYLENOL) 500 MG tablet Take 500 mg by mouth every 6 (six) hours as needed.     albuterol (VENTOLIN HFA) 108 (90 Base) MCG/ACT inhaler Inhale 2 puffs into the lungs every 6 (six) hours as needed for wheezing or shortness of breath. 8 g 2   ALPRAZolam (XANAX) 0.25 MG tablet Take by mouth.     cyclobenzaprine (FLEXERIL) 10 MG tablet TAKE ONE HALF (1/2) TO ONE TABLET BY MOUTH AT BEDTIME AS NEEDED FOR MUSCLE SPASMS 30 tablet 3   DULoxetine (CYMBALTA) 60 MG capsule Take 60 mg by mouth daily.     ELIQUIS 5 MG TABS tablet TAKE ONE TABLET BY MOUTH TWICE A DAY 60 tablet 5   flecainide (TAMBOCOR) 100 MG tablet Take 1  tablet (100 mg total) by mouth 2 (two) times daily. 180 tablet 3   Fluticasone-Umeclidin-Vilant (TRELEGY ELLIPTA) 100-62.5-25 MCG/ACT AEPB Inhale 1 puff into the lungs daily. 28 each 11   furosemide (LASIX) 20 MG tablet Take 1 tablet (20 mg total) by mouth daily. 90 tablet 3   gabapentin (NEURONTIN) 300 MG capsule  Take 600 mg by mouth at bedtime.     guaiFENesin-dextromethorphan (ROBITUSSIN DM) 100-10 MG/5ML syrup Take 5 mLs by mouth every 4 (four) hours as needed for cough (chest congestion). 118 mL 0   hydrOXYzine (VISTARIL) 25 MG capsule Take 25 mg by mouth 2 (two) times daily.     Melatonin 5 MG CAPS Take by mouth.     metoprolol succinate (TOPROL-XL) 50 MG 24 hr tablet TAKE ONE TABLET BY MOUTH TWICE A DAY WITH MEALS 180 tablet 0   mirabegron ER (MYRBETRIQ) 25 MG TB24 tablet Take 1 tablet (25 mg total) by mouth daily. 90 tablet 3   PARoxetine (PAXIL) 10 MG tablet Take 1 tablet by mouth daily.     rosuvastatin (CRESTOR) 10 MG tablet TAKE 1 TABLET BY MOUTH ONCE A DAY 90 tablet 3   spironolactone (ALDACTONE) 25 MG tablet Take 1 tablet (25 mg total) by mouth daily. 30 tablet 0   traMADol (ULTRAM) 50 MG tablet TAKE ONE (1) TO TWO (2) TABLETS BY MOUTH EVERY EIGHT HOURS AS NEEDED FOR MODERATE PAIN 60 tablet 0   traZODone (DESYREL) 50 MG tablet Take 50 mg by mouth at bedtime as needed.     No current facility-administered medications on file prior to visit.    Review of Systems  Constitutional:  Positive for fatigue. Negative for activity change, appetite change, fever and unexpected weight change.  HENT:  Negative for congestion, ear pain, rhinorrhea, sinus pressure and sore throat.   Eyes:  Negative for pain, redness and visual disturbance.  Respiratory:  Negative for cough, chest tightness, shortness of breath, wheezing and stridor.        Little to no cough No wheezing   Cardiovascular:  Negative for chest pain and palpitations.  Gastrointestinal:  Negative for abdominal pain, blood in stool, constipation and diarrhea.  Endocrine: Negative for polydipsia and polyuria.  Genitourinary:  Negative for dysuria, frequency and urgency.  Musculoskeletal:  Negative for arthralgias, back pain and myalgias.  Skin:  Negative for pallor and rash.  Allergic/Immunologic: Negative for environmental allergies.   Neurological:  Negative for dizziness, syncope and headaches.       Generalized weakness  Hematological:  Negative for adenopathy. Does not bruise/bleed easily.  Psychiatric/Behavioral:  Negative for decreased concentration and dysphoric mood. The patient is not nervous/anxious.        Objective:   Physical Exam Constitutional:      General: She is not in acute distress.    Appearance: Normal appearance. She is well-developed. She is obese. She is not ill-appearing or diaphoretic.  HENT:     Head: Normocephalic and atraumatic.     Mouth/Throat:     Mouth: Mucous membranes are moist.  Eyes:     General: No scleral icterus.    Conjunctiva/sclera: Conjunctivae normal.     Pupils: Pupils are equal, round, and reactive to light.  Neck:     Thyroid: No thyromegaly.     Vascular: No carotid bruit or JVD.  Cardiovascular:     Rate and Rhythm: Normal rate and regular rhythm.     Heart sounds: Normal heart sounds.     No gallop.  Pulmonary:     Effort: Pulmonary effort is normal. No respiratory distress.     Breath sounds: Normal breath sounds. No stridor. No wheezing, rhonchi or rales.     Comments: Diffusely distant bs  No wheeze or rales Abdominal:     General: There is no distension or abdominal bruit.     Palpations: Abdomen is soft.  Musculoskeletal:     Cervical back: Normal range of motion and neck supple.     Right lower leg: No edema.     Left lower leg: No edema.  Lymphadenopathy:     Cervical: No cervical adenopathy.  Skin:    General: Skin is warm and dry.     Coloration: Skin is not pale.     Findings: No rash.  Neurological:     Mental Status: She is alert.     Cranial Nerves: No cranial nerve deficit.     Coordination: Coordination normal.     Deep Tendon Reflexes: Reflexes are normal and symmetric. Reflexes normal.  Psychiatric:        Mood and Affect: Mood normal.           Assessment & Plan:   Problem List Items Addressed This Visit        Cardiovascular and Mediastinum   Acute on chronic diastolic CHF (congestive heart failure) (HCC)    Hosp for this and pna/ covid  Improved much now  14 lb weight loss  For cardiology f/u      HTN (hypertension)    BP: 128/65  On toprol and spironolactone  She was instructed to hold diltiazem until cardiology follow up   Reviewed hospital records, lab results and studies in detail        Relevant Orders   Basic metabolic panel   CBC with Differential/Platelet     Respiratory   RESOLVED: Acute on chronic respiratory failure with hypoxemia (HCC)    From covid pna with copd  Reviewed hospital records, lab results and studies in detail    Resolved  Has 02 at home for prn  ;pulm follow up planned      Bronchopneumonia - Primary    Clinically improved Covid  Reviewed hospital records, lab results and studies in detail    Finished steroids and antibiotic  For pulm follow up early oct       Relevant Orders   Basic metabolic panel   CBC with Differential/Platelet   COPD with acute exacerbation [facility])    Reviewed hospital records, lab results and studies in detail  In setting of covid pna  Much improved  02 sat 94% on RA today   Has HH coming  Reassuring exam  Follow up with pulm early oct  Call back and Er precautions noted in detail today          Other   COVID-19    S/p pneumonia and hosp Reviewed hospital records, lab results and studies in detail  Much clinical improvement  No antiviral due to current meds   Reassuring exam  Finished antibiotic and steriods For pulm and card follow up soon       Former smoker    Quit both smoking and vaping after this hospitalization  Commended Breathing is better

## 2022-12-04 NOTE — Assessment & Plan Note (Signed)
Quit both smoking and vaping after this hospitalization  Commended Breathing is better

## 2022-12-04 NOTE — Patient Instructions (Addendum)
Take care of yourself  Keep doing your PT  Use the oxygen if needed   If symptoms worsen or return let us know (go to ER if severe)  Labs today   Follow up with cardiology and pulmonary as planned

## 2022-12-04 NOTE — Assessment & Plan Note (Signed)
Hosp for this and pna/ covid  Improved much now  14 lb weight loss  For cardiology f/u

## 2022-12-04 NOTE — Assessment & Plan Note (Signed)
S/p pneumonia and hosp Reviewed hospital records, lab results and studies in detail  Much clinical improvement  No antiviral due to current meds   Reassuring exam  Finished antibiotic and steriods For pulm and card follow up soon

## 2022-12-05 ENCOUNTER — Telehealth: Payer: Self-pay

## 2022-12-05 DIAGNOSIS — U071 COVID-19: Secondary | ICD-10-CM | POA: Diagnosis not present

## 2022-12-05 DIAGNOSIS — J440 Chronic obstructive pulmonary disease with acute lower respiratory infection: Secondary | ICD-10-CM | POA: Diagnosis not present

## 2022-12-05 DIAGNOSIS — J1282 Pneumonia due to coronavirus disease 2019: Secondary | ICD-10-CM | POA: Diagnosis not present

## 2022-12-05 DIAGNOSIS — J441 Chronic obstructive pulmonary disease with (acute) exacerbation: Secondary | ICD-10-CM | POA: Diagnosis not present

## 2022-12-05 DIAGNOSIS — J15211 Pneumonia due to Methicillin susceptible Staphylococcus aureus: Secondary | ICD-10-CM | POA: Diagnosis not present

## 2022-12-05 DIAGNOSIS — J9621 Acute and chronic respiratory failure with hypoxia: Secondary | ICD-10-CM | POA: Diagnosis not present

## 2022-12-05 LAB — CBC WITH DIFFERENTIAL/PLATELET
Basophils Absolute: 0.1 10*3/uL (ref 0.0–0.1)
Basophils Relative: 0.6 % (ref 0.0–3.0)
Eosinophils Absolute: 0 10*3/uL (ref 0.0–0.7)
Eosinophils Relative: 0.1 % (ref 0.0–5.0)
HCT: 40.2 % (ref 36.0–46.0)
Hemoglobin: 13.1 g/dL (ref 12.0–15.0)
Lymphocytes Relative: 11.6 % — ABNORMAL LOW (ref 12.0–46.0)
Lymphs Abs: 2.4 10*3/uL (ref 0.7–4.0)
MCHC: 32.4 g/dL (ref 30.0–36.0)
MCV: 88.4 fl (ref 78.0–100.0)
Monocytes Absolute: 6.1 10*3/uL — ABNORMAL HIGH (ref 0.1–1.0)
Monocytes Relative: 29.5 % — ABNORMAL HIGH (ref 3.0–12.0)
Neutro Abs: 12 10*3/uL — ABNORMAL HIGH (ref 1.4–7.7)
Neutrophils Relative %: 58.2 % (ref 43.0–77.0)
Platelets: 128 10*3/uL — ABNORMAL LOW (ref 150.0–400.0)
RBC: 4.55 Mil/uL (ref 3.87–5.11)
RDW: 14.3 % (ref 11.5–15.5)
WBC: 20.6 10*3/uL (ref 4.0–10.5)

## 2022-12-05 LAB — BASIC METABOLIC PANEL
BUN: 12 mg/dL (ref 6–23)
CO2: 26 mEq/L (ref 19–32)
Calcium: 9.1 mg/dL (ref 8.4–10.5)
Chloride: 101 mEq/L (ref 96–112)
Creatinine, Ser: 0.71 mg/dL (ref 0.40–1.20)
GFR: 84.15 mL/min (ref >60.00)
Glucose, Bld: 106 mg/dL — ABNORMAL HIGH (ref 70–99)
Potassium: 3.9 mEq/L (ref 3.5–5.1)
Sodium: 135 mEq/L (ref 135–145)

## 2022-12-05 NOTE — Telephone Encounter (Signed)
Hope with [facility]  lab called critical lab result on WBC 20.6. sending note to Dr Milinda Antis, [employer] pool and will teams [place] CMA and also noted in [facility] lab notebook.

## 2022-12-05 NOTE — Telephone Encounter (Signed)
See result note Likely due to recent steroids Re check in 1 week please

## 2022-12-06 ENCOUNTER — Encounter: Payer: Medicare Other | Admitting: Family

## 2022-12-06 ENCOUNTER — Other Ambulatory Visit: Payer: Self-pay | Admitting: Cardiovascular Disease

## 2022-12-07 DIAGNOSIS — J1282 Pneumonia due to coronavirus disease 2019: Secondary | ICD-10-CM | POA: Diagnosis not present

## 2022-12-07 DIAGNOSIS — J9621 Acute and chronic respiratory failure with hypoxia: Secondary | ICD-10-CM | POA: Diagnosis not present

## 2022-12-07 DIAGNOSIS — J15211 Pneumonia due to Methicillin susceptible Staphylococcus aureus: Secondary | ICD-10-CM | POA: Diagnosis not present

## 2022-12-07 DIAGNOSIS — J440 Chronic obstructive pulmonary disease with acute lower respiratory infection: Secondary | ICD-10-CM | POA: Diagnosis not present

## 2022-12-07 DIAGNOSIS — U071 COVID-19: Secondary | ICD-10-CM | POA: Diagnosis not present

## 2022-12-07 DIAGNOSIS — J441 Chronic obstructive pulmonary disease with (acute) exacerbation: Secondary | ICD-10-CM | POA: Diagnosis not present

## 2022-12-10 ENCOUNTER — Telehealth: Payer: Self-pay | Admitting: Family Medicine

## 2022-12-10 DIAGNOSIS — D72821 Monocytosis (symptomatic): Secondary | ICD-10-CM

## 2022-12-10 NOTE — Telephone Encounter (Signed)
-----   Message from Alvina Chou sent at 12/08/2022  2:13 PM EDT ----- Regarding: Lab orders for MON, 9.30.24 1 Week lab orders, thanks

## 2022-12-11 ENCOUNTER — Other Ambulatory Visit (INDEPENDENT_AMBULATORY_CARE_PROVIDER_SITE_OTHER): Payer: Medicare Other

## 2022-12-11 DIAGNOSIS — J15211 Pneumonia due to Methicillin susceptible Staphylococcus aureus: Secondary | ICD-10-CM | POA: Diagnosis not present

## 2022-12-11 DIAGNOSIS — D72821 Monocytosis (symptomatic): Secondary | ICD-10-CM | POA: Diagnosis not present

## 2022-12-11 DIAGNOSIS — J1282 Pneumonia due to coronavirus disease 2019: Secondary | ICD-10-CM | POA: Diagnosis not present

## 2022-12-11 DIAGNOSIS — I110 Hypertensive heart disease with heart failure: Secondary | ICD-10-CM | POA: Diagnosis not present

## 2022-12-11 DIAGNOSIS — I700 Atherosclerosis of aorta: Secondary | ICD-10-CM | POA: Diagnosis not present

## 2022-12-11 DIAGNOSIS — J440 Chronic obstructive pulmonary disease with acute lower respiratory infection: Secondary | ICD-10-CM | POA: Diagnosis not present

## 2022-12-11 DIAGNOSIS — I482 Chronic atrial fibrillation, unspecified: Secondary | ICD-10-CM | POA: Diagnosis not present

## 2022-12-11 DIAGNOSIS — E785 Hyperlipidemia, unspecified: Secondary | ICD-10-CM | POA: Diagnosis not present

## 2022-12-11 DIAGNOSIS — J9621 Acute and chronic respiratory failure with hypoxia: Secondary | ICD-10-CM | POA: Diagnosis not present

## 2022-12-11 DIAGNOSIS — I5033 Acute on chronic diastolic (congestive) heart failure: Secondary | ICD-10-CM | POA: Diagnosis not present

## 2022-12-11 DIAGNOSIS — U071 COVID-19: Secondary | ICD-10-CM | POA: Diagnosis not present

## 2022-12-11 DIAGNOSIS — D696 Thrombocytopenia, unspecified: Secondary | ICD-10-CM | POA: Diagnosis not present

## 2022-12-11 DIAGNOSIS — J441 Chronic obstructive pulmonary disease with (acute) exacerbation: Secondary | ICD-10-CM | POA: Diagnosis not present

## 2022-12-11 LAB — CBC WITH DIFFERENTIAL/PLATELET
Basophils Absolute: 0.1 10*3/uL (ref 0.0–0.1)
Basophils Relative: 1 % (ref 0.0–3.0)
Eosinophils Absolute: 0 10*3/uL (ref 0.0–0.7)
Eosinophils Relative: 0.3 % (ref 0.0–5.0)
HCT: 35.2 % — ABNORMAL LOW (ref 36.0–46.0)
Hemoglobin: 11.8 g/dL — ABNORMAL LOW (ref 12.0–15.0)
Lymphocytes Relative: 16.3 % (ref 12.0–46.0)
Lymphs Abs: 1.4 10*3/uL (ref 0.7–4.0)
MCHC: 33.4 g/dL (ref 30.0–36.0)
MCV: 88.1 fL (ref 78.0–100.0)
Monocytes Absolute: 2.2 10*3/uL — ABNORMAL HIGH (ref 0.1–1.0)
Monocytes Relative: 25.6 % — ABNORMAL HIGH (ref 3.0–12.0)
Neutro Abs: 4.9 10*3/uL (ref 1.4–7.7)
Neutrophils Relative %: 56.8 % (ref 43.0–77.0)
Platelets: 110 10*3/uL — ABNORMAL LOW (ref 150.0–400.0)
RBC: 3.99 Mil/uL (ref 3.87–5.11)
RDW: 14.3 % (ref 11.5–15.5)
WBC: 8.7 10*3/uL (ref 4.0–10.5)

## 2022-12-12 DIAGNOSIS — J9621 Acute and chronic respiratory failure with hypoxia: Secondary | ICD-10-CM | POA: Diagnosis not present

## 2022-12-12 DIAGNOSIS — J1282 Pneumonia due to coronavirus disease 2019: Secondary | ICD-10-CM | POA: Diagnosis not present

## 2022-12-12 DIAGNOSIS — J441 Chronic obstructive pulmonary disease with (acute) exacerbation: Secondary | ICD-10-CM | POA: Diagnosis not present

## 2022-12-12 DIAGNOSIS — J15211 Pneumonia due to Methicillin susceptible Staphylococcus aureus: Secondary | ICD-10-CM | POA: Diagnosis not present

## 2022-12-12 DIAGNOSIS — J440 Chronic obstructive pulmonary disease with acute lower respiratory infection: Secondary | ICD-10-CM | POA: Diagnosis not present

## 2022-12-12 DIAGNOSIS — U071 COVID-19: Secondary | ICD-10-CM | POA: Diagnosis not present

## 2022-12-14 ENCOUNTER — Ambulatory Visit (INDEPENDENT_AMBULATORY_CARE_PROVIDER_SITE_OTHER): Payer: Medicare Other | Admitting: Pulmonary Disease

## 2022-12-14 ENCOUNTER — Encounter: Payer: Self-pay | Admitting: Pulmonary Disease

## 2022-12-14 VITALS — BP 118/70 | HR 75 | Temp 97.5°F | Ht 63.0 in | Wt 188.4 lb

## 2022-12-14 DIAGNOSIS — Z8616 Personal history of COVID-19: Secondary | ICD-10-CM

## 2022-12-14 DIAGNOSIS — J4489 Other specified chronic obstructive pulmonary disease: Secondary | ICD-10-CM | POA: Diagnosis not present

## 2022-12-14 DIAGNOSIS — J15211 Pneumonia due to Methicillin susceptible Staphylococcus aureus: Secondary | ICD-10-CM | POA: Diagnosis not present

## 2022-12-14 DIAGNOSIS — R9389 Abnormal findings on diagnostic imaging of other specified body structures: Secondary | ICD-10-CM

## 2022-12-14 DIAGNOSIS — J9601 Acute respiratory failure with hypoxia: Secondary | ICD-10-CM

## 2022-12-14 DIAGNOSIS — J440 Chronic obstructive pulmonary disease with acute lower respiratory infection: Secondary | ICD-10-CM | POA: Diagnosis not present

## 2022-12-14 DIAGNOSIS — J1282 Pneumonia due to coronavirus disease 2019: Secondary | ICD-10-CM | POA: Diagnosis not present

## 2022-12-14 DIAGNOSIS — J441 Chronic obstructive pulmonary disease with (acute) exacerbation: Secondary | ICD-10-CM | POA: Diagnosis not present

## 2022-12-14 DIAGNOSIS — J9621 Acute and chronic respiratory failure with hypoxia: Secondary | ICD-10-CM | POA: Diagnosis not present

## 2022-12-14 DIAGNOSIS — U071 COVID-19: Secondary | ICD-10-CM | POA: Diagnosis not present

## 2022-12-14 NOTE — Patient Instructions (Signed)
To order an oxygen level that will be done while you sleep.  [employer] will contact you to get this set up.  Continue your therapy as you are doing.  Will see you towards the third week in November, we we will have a CT of the chest performed prior to that visit.  Call sooner should any new problems arise.

## 2022-12-14 NOTE — Progress Notes (Signed)
Subjective:    Patient ID: Anita Harrell, female    DOB: May 05, 1950, 72 y.o.   MRN: [id number]  Patient Care Team: Tower, Audrie Gallus, MD as PCP - General (Family Medicine) Mariah Milling Tollie Pizza, MD as PCP - Cardiology (Cardiology) Annamaria Helling, MD as Consulting Physician (Obstetrics and Gynecology) Salena Saner, MD as Consulting Physician (Pulmonary Disease) Doles-Johnson, Teah, NP as Nurse Practitioner (Psychiatry) Kathyrn Sheriff, [facility] (Inactive) as Pharmacist (Pharmacist)  Chief Complaint  Patient presents with   Follow-up    Admitted for Covid on 11/18/2022. No SOB, wheezing or cough.     HPI Patient is a 72 year old former smoker who recently discontinued use of vaping nicotine containing substance, who presents for follow-up after a recent hospitalization at [facility] between 18 November 2022 through 25 November 2022 due to acute respiratory failure with hypoxia in the setting of pneumonia and COVID-19 infection.  I last saw her on 28 February 2022 and in the interim she was seen by Rubye Oaks, NP on 23 June 2022.  At that time she was stable from her COPD with asthma overlap on Trelegy Ellipta.  She was asked to follow-up in 6 months time.  As noted, the patient was admitted on 18 November 2022 to [facility] after she developed worsening shortness of breath in the setting of COVID-19 self diagnosed 5 days prior.  The patient was not provided Paxlovid due to being on anticoagulation.  She was noted to have oxygen saturations at 82% on room air.  She was admitted for management of those issues.  She had a CT angio chest on 19 November 2022 that showed patchy groundglass opacities and right lower lobe masslike opacity measuring 2.8 x 2.3 cm as well as bilateral lower lobe atelectasis.  There are also enlarged right hilar lymph nodes that could be reactive.  The patient eventually recovered, she was discharged home on 14 September on supplemental oxygen.  Since that time she has been weaned  off of oxygen, she has physical therapy at home that monitor her oxygen level.  She states she has not required it.  She is compliant with Trelegy Ellipta which she notes helps her breathing.  She feels that she has gradually getting back to baseline.  She still has some dyspnea on exertion but this is improving significantly since discharge.  She does not endorse any other symptomatology today.   Review of Systems A 10 point review of systems was performed and it is as noted above otherwise negative.   Patient Active Problem List   Diagnosis Date Noted   Delirium 11/21/2022   Hyponatremia 11/19/2022   Hypophosphatemia 11/19/2022   Metabolic acidosis 11/19/2022   Bronchopneumonia 11/18/2022   (HFpEF) heart failure with preserved ejection fraction (HCC) 11/18/2022   Atrial fibrillation, chronic (HCC) 11/18/2022   Thrombocytopenia (HCC) 11/18/2022   COVID-19 11/15/2022   Sundowning 05/17/2022   Hallucinations 05/17/2022   Polypharmacy 05/17/2022   General weakness 02/16/2022   Poor balance 02/16/2022   Falling 02/16/2022   COPD with acute exacerbation (HCC) 02/13/2022   Fall at home, initial encounter 02/13/2022   Hypokalemia 02/13/2022   HTN (hypertension) 02/13/2022   Acute on chronic diastolic CHF (congestive heart failure) (HCC) 02/13/2022   DDD (degenerative disc disease), lumbar 12/26/2021   Bilateral hip bursitis 10/05/2021   Atrial fibrillation with RVR (HCC) 10/05/2021   Cerumen impaction 06/21/2021   Estrogen deficiency 06/07/2021   Overactive bladder 10/17/2019   Medicare annual wellness visit, subsequent 01/16/2019   Thyroid  nodule 05/01/2018   Electronic cigarette use 04/03/2018   Obesity (BMI 30-39.9) 04/03/2018   Lung mass 02/01/2018   Abnormal chest x-ray 01/29/2018   Smokers' cough (HCC) 01/01/2018   Microscopic hematuria 08/13/2012   Routine general medical examination at a health care facility 07/22/2012   Leukocytosis 03/30/2010   Prediabetes 02/21/2008    HYPERTENSION, BENIGN ESSENTIAL 01/22/2008   Allergic rhinitis 07/25/2007   Low back pain 06/26/2007   Personal history of goiter 07/27/2006   Hyperlipidemia 07/27/2006   PANIC DISORDER 07/27/2006   Former smoker 07/27/2006   Depression with anxiety 07/27/2006   Insomnia 07/27/2006   History of colonic polyps 06/09/2004    Social History   Tobacco Use   Smoking status: Former    Current packs/day: 0.00    Types: E-cigarettes, Cigarettes    Quit date: 2011    Years since quitting: 13.7   Smokeless tobacco: Never   Tobacco comments:    She is vaping, former smoker    Substance Use Topics   Alcohol use: No    Alcohol/week: 0.0 standard drinks of alcohol    Allergies  Allergen Reactions   Abilify [Aripiprazole]     Vision problem   Ambien [Zolpidem]     Hallucinations Ms change    Codeine Nausea And Vomiting    REACTION: Nausea and vomiting Pt has tolerated vicodin & percocet in the past   Cymbalta [Duloxetine Hcl]     More depressed/tearful    Lipitor [Atorvastatin]     Muscle pain    Vesicare [Solifenacin]     MS change   Wellbutrin [Bupropion]     Current Meds  Medication Sig   acetaminophen (TYLENOL) 500 MG tablet Take 500 mg by mouth every 6 (six) hours as needed.   albuterol (VENTOLIN HFA) 108 (90 Base) MCG/ACT inhaler Inhale 2 puffs into the lungs every 6 (six) hours as needed for wheezing or shortness of breath.   ALPRAZolam (XANAX) 0.25 MG tablet Take by mouth.   cyclobenzaprine (FLEXERIL) 10 MG tablet TAKE ONE HALF (1/2) TO ONE TABLET BY MOUTH AT BEDTIME AS NEEDED FOR MUSCLE SPASMS   DULoxetine (CYMBALTA) 60 MG capsule Take 60 mg by mouth daily.   ELIQUIS 5 MG TABS tablet TAKE ONE TABLET BY MOUTH TWICE A DAY   flecainide (TAMBOCOR) 100 MG tablet TAKE ONE TABLET BY MOUTH TWICE A DAY   Fluticasone-Umeclidin-Vilant (TRELEGY ELLIPTA) 100-62.5-25 MCG/ACT AEPB Inhale 1 puff into the lungs daily.   furosemide (LASIX) 20 MG tablet Take 1 tablet (20 mg total)  by mouth daily.   gabapentin (NEURONTIN) 300 MG capsule Take 600 mg by mouth at bedtime.   guaiFENesin-dextromethorphan (ROBITUSSIN DM) 100-10 MG/5ML syrup Take 5 mLs by mouth every 4 (four) hours as needed for cough (chest congestion).   hydrOXYzine (VISTARIL) 25 MG capsule Take 25 mg by mouth 2 (two) times daily.   Melatonin 5 MG CAPS Take by mouth.   metoprolol succinate (TOPROL-XL) 50 MG 24 hr tablet TAKE ONE TABLET BY MOUTH TWICE A DAY WITH MEALS   mirabegron ER (MYRBETRIQ) 25 MG TB24 tablet Take 1 tablet (25 mg total) by mouth daily.   PARoxetine (PAXIL) 10 MG tablet Take 1 tablet by mouth daily.   rosuvastatin (CRESTOR) 10 MG tablet TAKE 1 TABLET BY MOUTH ONCE A DAY   spironolactone (ALDACTONE) 25 MG tablet Take 1 tablet (25 mg total) by mouth daily.   traMADol (ULTRAM) 50 MG tablet TAKE ONE (1) TO TWO (2) TABLETS BY MOUTH EVERY  EIGHT HOURS AS NEEDED FOR MODERATE PAIN   traZODone (DESYREL) 50 MG tablet Take 50 mg by mouth at bedtime as needed.    Immunization History  Administered Date(s) Administered   Fluad Quad(high Dose 65+) 01/16/2019, 11/29/2021   Influenza Split 03/27/2011, 01/23/2012   Influenza,inj,Quad PF,6+ Mos 11/26/2012, 11/10/2013, 11/24/2016, 12/25/2017   PFIZER(Purple Top)SARS-COV-2 Vaccination 10/20/2019, 11/10/2019   Pneumococcal Conjugate-13 08/21/2014   Pneumococcal Polysaccharide-23 05/19/2008, 08/30/2015   Td 03/13/2001   Tdap 01/23/2012   Zoster Recombinant(Shingrix) 05/04/2021, 06/21/2021   Zoster, Live 01/07/2013        Objective:     BP 118/70 (BP Location: Right Arm, Cuff Size: Normal)   Pulse 75   Temp (!) 97.5 F (36.4 C)   Ht 5\' 3"  (1.6 m)   Wt 188 lb 6.4 oz (85.5 kg)   SpO2 93%   BMI 33.37 kg/m   SpO2: 93 % O2 Device: None (Room air)  GENERAL: Obese woman, no acute distress, presents in transport chair today, no conversational dyspnea. HEAD: Normocephalic, atraumatic.  EYES: Pupils equal, round, reactive to light.  No scleral  icterus.  MOUTH: Pharynx clear, oral mucosa moist. NECK: Supple. No thyromegaly. Trachea midline. No JVD.  No adenopathy. PULMONARY: Good air entry bilaterally.  No adventitious sounds. CARDIOVASCULAR: S1 and S2.  Irregular heart rate (atrial fibrillation) between 60-70 bpm, no cardiac murmurs discerned. ABDOMEN: Obese, otherwise benign. MUSCULOSKELETAL: No joint deformity, no clubbing, no edema.  NEUROLOGIC: No overt focal deficit, no gait disturbance, speech is fluent. SKIN: Intact,warm,dry. PSYCH: Mood and behavior normal.    Representative image of the PET/CT performed 19 November 2022 showing the masslike density on the right lower lobe as well as bilateral infiltrates:    Assessment & Plan:     ICD-10-CM   1. COPD with asthma (HCC)  J44.89 Overnight Pulse Oximetry Study   Appears fairly well compensated Continue Trelegy 100 Continue as needed albuterol    2. Acute respiratory failure with hypoxia (HCC)  J96.01 Overnight Pulse Oximetry Study   Weaned off of oxygen at rest Declined ambulatory oximetry today Will obtain overnight oximetry on room air    3. Abnormal CT of the chest  R93.89 CT CHEST WO CONTRAST   Will repeat CT chest in 4 to 6 weeks time Follow-up after CT chest performed    4. Personal history of COVID-19  Z86.16 CT CHEST WO CONTRAST   This issue adds complexity to her management Nodular densities may be secondary to COVID-19 infection      Orders Placed This Encounter  Procedures   CT CHEST WO CONTRAST    End of Nov. Before follow up visit with Dr. Jayme Cloud.    Standing Status:   Future    Standing Expiration Date:   12/14/2023    Order Specific Question:   Preferred imaging location?    [facility]   Overnight Pulse Oximetry Study    Room Air DME: [employer]    Standing Status:   Future    Standing Expiration Date:   12/14/2023   We will see the patient in follow-up after repeat CT scan is performed.  Will call her with the results of  the overnight oximetry.  She is to continue home therapy.  May consider pulmonary rehab post home therapy.  Patient is to contact us prior to follow-up time should any new difficulties arise.     Gailen Shelter, MD Advanced Bronchoscopy PCCM [facility]    *This note was dictated  using voice recognition software/Dragon.  Despite best efforts to proofread, errors can occur which can change the meaning. Any transcriptional errors that result from this process are unintentional and may not be fully corrected at the time of dictation.

## 2022-12-15 ENCOUNTER — Other Ambulatory Visit: Payer: Self-pay | Admitting: Family Medicine

## 2022-12-15 DIAGNOSIS — J441 Chronic obstructive pulmonary disease with (acute) exacerbation: Secondary | ICD-10-CM | POA: Diagnosis not present

## 2022-12-15 DIAGNOSIS — J440 Chronic obstructive pulmonary disease with acute lower respiratory infection: Secondary | ICD-10-CM | POA: Diagnosis not present

## 2022-12-15 DIAGNOSIS — J1282 Pneumonia due to coronavirus disease 2019: Secondary | ICD-10-CM | POA: Diagnosis not present

## 2022-12-15 DIAGNOSIS — J15211 Pneumonia due to Methicillin susceptible Staphylococcus aureus: Secondary | ICD-10-CM | POA: Diagnosis not present

## 2022-12-15 DIAGNOSIS — J9621 Acute and chronic respiratory failure with hypoxia: Secondary | ICD-10-CM | POA: Diagnosis not present

## 2022-12-15 DIAGNOSIS — U071 COVID-19: Secondary | ICD-10-CM | POA: Diagnosis not present

## 2022-12-18 DIAGNOSIS — J9621 Acute and chronic respiratory failure with hypoxia: Secondary | ICD-10-CM | POA: Diagnosis not present

## 2022-12-18 DIAGNOSIS — J440 Chronic obstructive pulmonary disease with acute lower respiratory infection: Secondary | ICD-10-CM | POA: Diagnosis not present

## 2022-12-18 DIAGNOSIS — J1282 Pneumonia due to coronavirus disease 2019: Secondary | ICD-10-CM | POA: Diagnosis not present

## 2022-12-18 DIAGNOSIS — J441 Chronic obstructive pulmonary disease with (acute) exacerbation: Secondary | ICD-10-CM | POA: Diagnosis not present

## 2022-12-18 DIAGNOSIS — U071 COVID-19: Secondary | ICD-10-CM | POA: Diagnosis not present

## 2022-12-18 DIAGNOSIS — J15211 Pneumonia due to Methicillin susceptible Staphylococcus aureus: Secondary | ICD-10-CM | POA: Diagnosis not present

## 2022-12-18 NOTE — Progress Notes (Unsigned)
Cardiology Office Note  Date:  12/19/2022   ID:  Anita, Harrell 07-27-1950, MRN [id number]  PCP:  Judy Pimple, MD   QQ:[id number] covid PNA   HPI:  [name] is a 72 year old woman with past medical history of Hypertension Depression/anxiety Vapor cigarettes/former smoker 20 years Chronic shortness of breath/smoker's cough Atrial fibrillation noted on visit with pulmonary Who presents for follow-up of her paroxysmal atrial fibrillation  Last seen by myself in clinic November 2023  Admission to [facility] for Covid PNA9/24 Admitted with respiratory distress Diltiazem held at discharge Reports that she stopped vaping  Lost weight 20 pounds she believes over the past several months Weight 184 at home, stable Denies PND orthopnea, no leg swelling  Legs feel weak following recent hospitalization, still walking with a walker Husband helps at home  EKG personally reviewed by myself on todays visit EKG Interpretation Date/Time:  Tuesday December 19 2022 14:59:03 EDT Ventricular Rate:  65 PR Interval:  218 QRS Duration:  106 QT Interval:  454 QTC Calculation: 472 R Axis:   13  Text Interpretation: Sinus rhythm with 1st degree A-V block Incomplete right bundle branch block Nonspecific T wave abnormality Prolonged QT When compared with ECG of 18-Nov-2022 07:51, No significant change was found Confirmed by Julien Nordmann [phone]) on 12/19/2022 3:10:04 PM   in clinic May 2023, was in atrial fibrillation, new onset Seen by one of our providers August 31, 2021, was in NSR  admitted to the hospital from 12/4 through 12/6 with acute respiratory failure with hypoxia in the setting of COPD exacerbation and acute on chronic HFpEF.   Echo showed an EF of 50 to 55%, no regional wall motion abnormalities,  normal PASP, treated with steroids, nebulizer therapy, and diuresis with symptomatic improvement.   Seen in clinic after hospital discharge February 21, 2022, in atrial fibrillation  at that time Was started on diltiazem ER 120 with metoprolol succinate 50 twice daily and flecainide 100 twice daily Scheduled for cardioversion but this was canceled March 02, 2022 as she was back in normal sinus rhythm  Echocardiogram August 30, 2021 . Left ventricular ejection fraction, by estimation, is 55 to 60%. The  left ventricle has normal function. The left ventricle has no regional  wall motion abnormalities. Left ventricular diastolic parameters are  indeterminate.   2. Right ventricular systolic function is normal. The right ventricular  size is normal. Tricuspid regurgitation signal is inadequate for assessing  PA pressure.   3. Left atrial size was moderately dilated.   4. The mitral valve is normal in structure. Mild mitral valve  regurgitation. No evidence of mitral stenosis.   5. The aortic valve is normal in structure. Aortic valve regurgitation is  not visualized. No aortic stenosis is present.   6. The inferior vena cava is normal in size with greater than 50%  respiratory variability, suggesting right atrial pressure of 3 mmHg.   PMH:   has a past medical history of Allergy, Anxiety, Arrhythmia, Back pain, Chest pain, CHF (congestive heart failure) (HCC), COPD (chronic obstructive pulmonary disease) (HCC), Depression, Epigastric pain, Goiter, History of tobacco abuse, Hyperglycemia, Hyperlipidemia, Hypertension, Insomnia, Labile blood pressure, Panic disorder, Personal history of colonic polyps (06/09/2004), and SVD (spontaneous vaginal delivery).  PSH:    Past Surgical History:  Procedure Laterality Date   ABDOMINAL HYSTERECTOMY N/A 09/24/2012   Procedure: HYSTERECTOMY ABDOMINAL;  Surgeon: Miguel Aschoff, MD;  Location: [facility];  Service: Gynecology;  Laterality: N/A;   BREAST BIOPSY Right  2016   CARDIOVERSION N/A 02/09/2022   Procedure: CARDIOVERSION;  Surgeon: Antonieta Iba, MD;  Location: [facility];  Service: Cardiovascular;  Laterality: N/A;   CARDIOVERSION  N/A 03/02/2022   Procedure: CARDIOVERSION;  Surgeon: Antonieta Iba, MD;  Location: [facility] OR[facility];  Service: Cardiovascular;  Laterality: N/A;   COLONOSCOPY     COLONOSCOPY  2017   ELBOW SURGERY  2004   EYE SURGERY     bilateral lasik   SALPINGOOPHORECTOMY Bilateral 09/24/2012   Procedure: SALPINGO OOPHORECTOMY;  Surgeon: Miguel Aschoff, MD;  Location: [facility];  Service: Gynecology;  Laterality: Bilateral;   THYROID SURGERY     B9 massess   WART FULGURATION N/A 09/24/2012   Procedure: FULGURATION VAGINAL WART;  Surgeon: Miguel Aschoff, MD;  Location: [facility];  Service: Gynecology;  Laterality: N/A;   WISDOM TOOTH EXTRACTION      Current Outpatient Medications  Medication Sig Dispense Refill   acetaminophen (TYLENOL) 500 MG tablet Take 500 mg by mouth every 6 (six) hours as needed.     albuterol (VENTOLIN HFA) 108 (90 Base) MCG/ACT inhaler Inhale 2 puffs into the lungs every 6 (six) hours as needed for wheezing or shortness of breath. 8 g 2   ALPRAZolam (XANAX) 0.25 MG tablet Take by mouth at bedtime.     cyclobenzaprine (FLEXERIL) 10 MG tablet TAKE ONE HALF (1/2) TO ONE TABLET BY MOUTH AT BEDTIME AS NEEDED FOR MUSCLE SPASMS 30 tablet 3   DULoxetine (CYMBALTA) 60 MG capsule Take 60 mg by mouth daily.     ELIQUIS 5 MG TABS tablet TAKE ONE TABLET BY MOUTH TWICE A DAY 60 tablet 5   flecainide (TAMBOCOR) 100 MG tablet TAKE ONE TABLET BY MOUTH TWICE A DAY 180 tablet 0   Fluticasone-Umeclidin-Vilant (TRELEGY ELLIPTA) 100-62.5-25 MCG/ACT AEPB Inhale 1 puff into the lungs daily. 28 each 11   furosemide (LASIX) 20 MG tablet Take 1 tablet (20 mg total) by mouth daily. 90 tablet 3   gabapentin (NEURONTIN) 300 MG capsule Take 600 mg by mouth at bedtime.     guaiFENesin-dextromethorphan (ROBITUSSIN DM) 100-10 MG/5ML syrup Take 5 mLs by mouth every 4 (four) hours as needed for cough (chest congestion). 118 mL 0   hydrOXYzine (VISTARIL) 25 MG capsule Take 25 mg by mouth 2 (two) times daily.     Melatonin 5 MG CAPS  Take by mouth.     metoprolol succinate (TOPROL-XL) 50 MG 24 hr tablet TAKE ONE TABLET BY MOUTH TWICE A DAY WITH MEALS 180 tablet 0   mirabegron ER (MYRBETRIQ) 25 MG TB24 tablet Take 1 tablet (25 mg total) by mouth daily. 90 tablet 3   PARoxetine (PAXIL) 10 MG tablet Take 1 tablet by mouth daily.     rosuvastatin (CRESTOR) 10 MG tablet TAKE 1 TABLET BY MOUTH ONCE A DAY 90 tablet 3   spironolactone (ALDACTONE) 25 MG tablet Take 1 tablet (25 mg total) by mouth daily. 30 tablet 0   traMADol (ULTRAM) 50 MG tablet TAKE ONE (1) TO TWO (2) TABLETS BY MOUTH EVERY EIGHT HOURS AS NEEDED FOR MODERATE PAIN 60 tablet 0   traZODone (DESYREL) 50 MG tablet Take 50 mg by mouth at bedtime as needed.     No current facility-administered medications for this visit.    Allergies:   Abilify [aripiprazole], Ambien [zolpidem], Codeine, Cymbalta [duloxetine hcl], Lipitor [atorvastatin], Vesicare [solifenacin], and Wellbutrin [bupropion]   Social History:  The patient  reports that she quit smoking about 13 years ago. Her smoking use  included e-cigarettes and cigarettes. She has never used smokeless tobacco. She reports that she does not drink alcohol and does not use drugs.   Family History:   family history includes Alcohol abuse in her father; Breast cancer in her sister; Cancer in her father and sister; Heart disease in her father and sister; Hypertension in her mother; Stroke in her mother.    Review of Systems: Review of Systems  Constitutional: Negative.   HENT: Negative.    Respiratory:  Positive for shortness of breath.   Cardiovascular: Negative.   Gastrointestinal: Negative.   Musculoskeletal: Negative.   Neurological: Negative.   Psychiatric/Behavioral: Negative.    All other systems reviewed and are negative.   PHYSICAL EXAM: VS:  BP 102/60   Pulse 65   Ht 5\' 3"  (1.6 m)   Wt 187 lb 6.4 oz (85 kg)   SpO2 94%   BMI 33.20 kg/m  , BMI Body mass index is 33.2 kg/m. Constitutional:  oriented to  person, place, and time. No distress.  HENT:  Head: Grossly normal Eyes:  no discharge. No scleral icterus.  Neck: No JVD, no carotid bruits  Cardiovascular: Regular rate and rhythm, no murmurs appreciated Pulmonary/Chest: Clear to auscultation bilaterally, no wheezes or rails Abdominal: Soft.  no distension.  no tenderness.  Musculoskeletal: Normal range of motion Neurological:  normal muscle tone. Coordination normal. No atrophy Skin: Skin warm and dry Psychiatric: normal affect, pleasant   Recent Labs: 11/18/2022: ALT 21; B Natriuretic Peptide 222.1 11/24/2022: Magnesium 2.6 12/04/2022: BUN 12; Creatinine, Ser 0.71; Potassium 3.9; Sodium 135 12/11/2022: Hemoglobin 11.8; Platelets 110.0    Lipid Panel Lab Results  Component Value Date   CHOL 118 06/07/2021   HDL 41.30 06/07/2021   LDLCALC 37 06/07/2021   TRIG 197.0 (H) 06/07/2021    Wt Readings from Last 3 Encounters:  12/19/22 187 lb 6.4 oz (85 kg)  12/14/22 188 lb 6.4 oz (85.5 kg)  12/04/22 180 lb 6 oz (81.8 kg)     ASSESSMENT AND PLAN:  Problem List Items Addressed This Visit       Cardiology Problems   Hyperlipidemia   Relevant Orders   EKG 12-Lead (Completed)   Other Visit Diagnoses     Chronic diastolic heart failure (HCC)    -  Primary   Relevant Orders   EKG 12-Lead (Completed)   Persistent atrial fibrillation (HCC)       Relevant Orders   EKG 12-Lead (Completed)   Essential hypertension       Relevant Orders   EKG 12-Lead (Completed)   Chronic obstructive pulmonary disease, unspecified COPD type (HCC)       Relevant Orders   EKG 12-Lead (Completed)   Generalized weakness       Relevant Orders   EKG 12-Lead (Completed)   SOB (shortness of breath)       Relevant Orders   EKG 12-Lead (Completed)      Paroxysmal atrial fibrillation with RVR Maintaining normal sinus rhythm on Eliquis, metoprolol succinate 50 daily, flecainide 100 twice daily  Diltiazem held at hospital discharge presumably for  hypotension  No medication changes made  Shortness of breath Reports chronic mild shortness of breath Prior smoking history, deconditioning Recommend Lasix 20 daily with potassium 20 daily  Anxiety Reports doing well on Cymbalta  COPD Recent COVID, COPD exacerbation Respiratory status much improved, legs weaker, still recovering Followed by pulmonary   Total encounter time more than 30 minutes  Greater than 50% was spent in counseling  and coordination of care with the patient    Signed, Dossie Arbour, M.D., Ph.D. [facility] [place], [place] [phone]

## 2022-12-18 NOTE — Telephone Encounter (Signed)
Name of Medication: Tramadol Name of Pharmacy: [facility]  Last Fill or Written Date and Quantity: 11/10/22 # 60/ 0 refills  Last Office Visit and Type: 12/04/22 Hospital f/u Next Office Visit and Type: none scheduled

## 2022-12-19 ENCOUNTER — Ambulatory Visit: Payer: Medicare Other | Attending: Cardiovascular Disease | Admitting: Cardiovascular Disease

## 2022-12-19 ENCOUNTER — Encounter: Payer: Self-pay | Admitting: Cardiovascular Disease

## 2022-12-19 ENCOUNTER — Ambulatory Visit: Payer: Medicare Other | Admitting: Cardiovascular Disease

## 2022-12-19 VITALS — BP 102/60 | HR 65 | Ht 63.0 in | Wt 187.4 lb

## 2022-12-19 DIAGNOSIS — R0602 Shortness of breath: Secondary | ICD-10-CM | POA: Diagnosis not present

## 2022-12-19 DIAGNOSIS — I10 Essential (primary) hypertension: Secondary | ICD-10-CM | POA: Diagnosis not present

## 2022-12-19 DIAGNOSIS — I5032 Chronic diastolic (congestive) heart failure: Secondary | ICD-10-CM

## 2022-12-19 DIAGNOSIS — J449 Chronic obstructive pulmonary disease, unspecified: Secondary | ICD-10-CM | POA: Diagnosis not present

## 2022-12-19 DIAGNOSIS — R531 Weakness: Secondary | ICD-10-CM | POA: Diagnosis not present

## 2022-12-19 DIAGNOSIS — I4819 Other persistent atrial fibrillation: Secondary | ICD-10-CM

## 2022-12-19 DIAGNOSIS — E782 Mixed hyperlipidemia: Secondary | ICD-10-CM | POA: Diagnosis not present

## 2022-12-19 MED ORDER — FLECAINIDE ACETATE 100 MG PO TABS: 100.0000 mg | ORAL_TABLET | Freq: Two times a day (BID) | ORAL | 3 refills | Status: DC

## 2022-12-19 MED ORDER — METOPROLOL SUCCINATE ER 50 MG PO TB24: ORAL_TABLET | ORAL | 3 refills | Status: DC

## 2022-12-19 MED ORDER — FUROSEMIDE 20 MG PO TABS: 20.0000 mg | ORAL_TABLET | Freq: Every day | ORAL | 3 refills | Status: DC

## 2022-12-19 NOTE — Patient Instructions (Addendum)
Medication Instructions:  No changes  If you need a refill on your cardiac medications before your next appointment, please call your pharmacy.    Lab work: No new labs needed   Testing/Procedures: No new testing needed   Follow-Up: At [facility], you and your health needs are our priority.  As part of our continuing mission to provide you with exceptional heart care, we have created designated Provider Care Teams.  These Care Teams include your primary Cardiologist (physician) and Advanced Practice Providers (APPs -  Physician Assistants and Nurse Practitioners) who all work together to provide you with the care you need, when you need it.  You will need a follow up appointment in 6 months  Providers on your designated Care Team:   [name] Berge, NP Ryan Dunn, PA-C Cadence Furth, PA-C  COVID-19 Vaccine Information can be found at: [email or web address] For questions related to vaccine distribution or appointments, please email [email or web address] or call [phone].   

## 2022-12-21 DIAGNOSIS — F332 Major depressive disorder, recurrent severe without psychotic features: Secondary | ICD-10-CM | POA: Diagnosis not present

## 2022-12-21 DIAGNOSIS — F03A3 Unspecified dementia, mild, with mood disturbance: Secondary | ICD-10-CM | POA: Diagnosis not present

## 2022-12-21 DIAGNOSIS — F5101 Primary insomnia: Secondary | ICD-10-CM | POA: Diagnosis not present

## 2022-12-21 DIAGNOSIS — F411 Generalized anxiety disorder: Secondary | ICD-10-CM | POA: Diagnosis not present

## 2022-12-22 DIAGNOSIS — J15211 Pneumonia due to Methicillin susceptible Staphylococcus aureus: Secondary | ICD-10-CM | POA: Diagnosis not present

## 2022-12-22 DIAGNOSIS — J440 Chronic obstructive pulmonary disease with acute lower respiratory infection: Secondary | ICD-10-CM | POA: Diagnosis not present

## 2022-12-22 DIAGNOSIS — J441 Chronic obstructive pulmonary disease with (acute) exacerbation: Secondary | ICD-10-CM | POA: Diagnosis not present

## 2022-12-22 DIAGNOSIS — J9621 Acute and chronic respiratory failure with hypoxia: Secondary | ICD-10-CM | POA: Diagnosis not present

## 2022-12-22 DIAGNOSIS — U071 COVID-19: Secondary | ICD-10-CM | POA: Diagnosis not present

## 2022-12-22 DIAGNOSIS — J1282 Pneumonia due to coronavirus disease 2019: Secondary | ICD-10-CM | POA: Diagnosis not present

## 2022-12-28 DIAGNOSIS — U071 COVID-19: Secondary | ICD-10-CM | POA: Diagnosis not present

## 2022-12-28 DIAGNOSIS — J15211 Pneumonia due to Methicillin susceptible Staphylococcus aureus: Secondary | ICD-10-CM | POA: Diagnosis not present

## 2022-12-28 DIAGNOSIS — J441 Chronic obstructive pulmonary disease with (acute) exacerbation: Secondary | ICD-10-CM | POA: Diagnosis not present

## 2022-12-28 DIAGNOSIS — J440 Chronic obstructive pulmonary disease with acute lower respiratory infection: Secondary | ICD-10-CM | POA: Diagnosis not present

## 2022-12-28 DIAGNOSIS — J1282 Pneumonia due to coronavirus disease 2019: Secondary | ICD-10-CM | POA: Diagnosis not present

## 2022-12-28 DIAGNOSIS — J9621 Acute and chronic respiratory failure with hypoxia: Secondary | ICD-10-CM | POA: Diagnosis not present

## 2022-12-29 DIAGNOSIS — Z87891 Personal history of nicotine dependence: Secondary | ICD-10-CM | POA: Diagnosis not present

## 2022-12-29 DIAGNOSIS — Z7951 Long term (current) use of inhaled steroids: Secondary | ICD-10-CM | POA: Diagnosis not present

## 2022-12-29 DIAGNOSIS — Z9181 History of falling: Secondary | ICD-10-CM | POA: Diagnosis not present

## 2022-12-29 DIAGNOSIS — Z7952 Long term (current) use of systemic steroids: Secondary | ICD-10-CM | POA: Diagnosis not present

## 2022-12-29 DIAGNOSIS — Z7901 Long term (current) use of anticoagulants: Secondary | ICD-10-CM | POA: Diagnosis not present

## 2022-12-29 DIAGNOSIS — J15211 Pneumonia due to Methicillin susceptible Staphylococcus aureus: Secondary | ICD-10-CM | POA: Diagnosis not present

## 2022-12-29 DIAGNOSIS — I482 Chronic atrial fibrillation, unspecified: Secondary | ICD-10-CM | POA: Diagnosis not present

## 2022-12-29 DIAGNOSIS — Z6834 Body mass index (BMI) 34.0-34.9, adult: Secondary | ICD-10-CM | POA: Diagnosis not present

## 2022-12-29 DIAGNOSIS — J1282 Pneumonia due to coronavirus disease 2019: Secondary | ICD-10-CM | POA: Diagnosis not present

## 2022-12-29 DIAGNOSIS — E785 Hyperlipidemia, unspecified: Secondary | ICD-10-CM | POA: Diagnosis not present

## 2022-12-29 DIAGNOSIS — U071 COVID-19: Secondary | ICD-10-CM | POA: Diagnosis not present

## 2022-12-29 DIAGNOSIS — I700 Atherosclerosis of aorta: Secondary | ICD-10-CM | POA: Diagnosis not present

## 2022-12-29 DIAGNOSIS — I110 Hypertensive heart disease with heart failure: Secondary | ICD-10-CM | POA: Diagnosis not present

## 2022-12-29 DIAGNOSIS — E871 Hypo-osmolality and hyponatremia: Secondary | ICD-10-CM | POA: Diagnosis not present

## 2022-12-29 DIAGNOSIS — J9621 Acute and chronic respiratory failure with hypoxia: Secondary | ICD-10-CM | POA: Diagnosis not present

## 2022-12-29 DIAGNOSIS — Z9981 Dependence on supplemental oxygen: Secondary | ICD-10-CM | POA: Diagnosis not present

## 2022-12-29 DIAGNOSIS — E669 Obesity, unspecified: Secondary | ICD-10-CM | POA: Diagnosis not present

## 2022-12-29 DIAGNOSIS — D696 Thrombocytopenia, unspecified: Secondary | ICD-10-CM | POA: Diagnosis not present

## 2022-12-29 DIAGNOSIS — E876 Hypokalemia: Secondary | ICD-10-CM | POA: Diagnosis not present

## 2022-12-29 DIAGNOSIS — I5033 Acute on chronic diastolic (congestive) heart failure: Secondary | ICD-10-CM | POA: Diagnosis not present

## 2022-12-29 DIAGNOSIS — J441 Chronic obstructive pulmonary disease with (acute) exacerbation: Secondary | ICD-10-CM | POA: Diagnosis not present

## 2022-12-29 DIAGNOSIS — J440 Chronic obstructive pulmonary disease with acute lower respiratory infection: Secondary | ICD-10-CM | POA: Diagnosis not present

## 2023-01-03 DIAGNOSIS — J1282 Pneumonia due to coronavirus disease 2019: Secondary | ICD-10-CM | POA: Diagnosis not present

## 2023-01-03 DIAGNOSIS — J441 Chronic obstructive pulmonary disease with (acute) exacerbation: Secondary | ICD-10-CM | POA: Diagnosis not present

## 2023-01-03 DIAGNOSIS — J440 Chronic obstructive pulmonary disease with acute lower respiratory infection: Secondary | ICD-10-CM | POA: Diagnosis not present

## 2023-01-03 DIAGNOSIS — J15211 Pneumonia due to Methicillin susceptible Staphylococcus aureus: Secondary | ICD-10-CM | POA: Diagnosis not present

## 2023-01-03 DIAGNOSIS — J9621 Acute and chronic respiratory failure with hypoxia: Secondary | ICD-10-CM | POA: Diagnosis not present

## 2023-01-03 DIAGNOSIS — U071 COVID-19: Secondary | ICD-10-CM | POA: Diagnosis not present

## 2023-01-05 DIAGNOSIS — J15211 Pneumonia due to Methicillin susceptible Staphylococcus aureus: Secondary | ICD-10-CM | POA: Diagnosis not present

## 2023-01-05 DIAGNOSIS — J441 Chronic obstructive pulmonary disease with (acute) exacerbation: Secondary | ICD-10-CM | POA: Diagnosis not present

## 2023-01-05 DIAGNOSIS — J9621 Acute and chronic respiratory failure with hypoxia: Secondary | ICD-10-CM | POA: Diagnosis not present

## 2023-01-05 DIAGNOSIS — U071 COVID-19: Secondary | ICD-10-CM | POA: Diagnosis not present

## 2023-01-05 DIAGNOSIS — J1282 Pneumonia due to coronavirus disease 2019: Secondary | ICD-10-CM | POA: Diagnosis not present

## 2023-01-05 DIAGNOSIS — J440 Chronic obstructive pulmonary disease with acute lower respiratory infection: Secondary | ICD-10-CM | POA: Diagnosis not present

## 2023-01-09 DIAGNOSIS — U071 COVID-19: Secondary | ICD-10-CM | POA: Diagnosis not present

## 2023-01-09 DIAGNOSIS — J441 Chronic obstructive pulmonary disease with (acute) exacerbation: Secondary | ICD-10-CM | POA: Diagnosis not present

## 2023-01-09 DIAGNOSIS — J1282 Pneumonia due to coronavirus disease 2019: Secondary | ICD-10-CM | POA: Diagnosis not present

## 2023-01-09 DIAGNOSIS — J15211 Pneumonia due to Methicillin susceptible Staphylococcus aureus: Secondary | ICD-10-CM | POA: Diagnosis not present

## 2023-01-09 DIAGNOSIS — J440 Chronic obstructive pulmonary disease with acute lower respiratory infection: Secondary | ICD-10-CM | POA: Diagnosis not present

## 2023-01-09 DIAGNOSIS — J9621 Acute and chronic respiratory failure with hypoxia: Secondary | ICD-10-CM | POA: Diagnosis not present

## 2023-01-11 DIAGNOSIS — J1282 Pneumonia due to coronavirus disease 2019: Secondary | ICD-10-CM | POA: Diagnosis not present

## 2023-01-11 DIAGNOSIS — J440 Chronic obstructive pulmonary disease with acute lower respiratory infection: Secondary | ICD-10-CM | POA: Diagnosis not present

## 2023-01-11 DIAGNOSIS — J15211 Pneumonia due to Methicillin susceptible Staphylococcus aureus: Secondary | ICD-10-CM | POA: Diagnosis not present

## 2023-01-11 DIAGNOSIS — J441 Chronic obstructive pulmonary disease with (acute) exacerbation: Secondary | ICD-10-CM | POA: Diagnosis not present

## 2023-01-11 DIAGNOSIS — U071 COVID-19: Secondary | ICD-10-CM | POA: Diagnosis not present

## 2023-01-11 DIAGNOSIS — J9621 Acute and chronic respiratory failure with hypoxia: Secondary | ICD-10-CM | POA: Diagnosis not present

## 2023-01-18 DIAGNOSIS — F411 Generalized anxiety disorder: Secondary | ICD-10-CM | POA: Diagnosis not present

## 2023-01-18 DIAGNOSIS — F332 Major depressive disorder, recurrent severe without psychotic features: Secondary | ICD-10-CM | POA: Diagnosis not present

## 2023-01-29 ENCOUNTER — Ambulatory Visit
Admission: RE | Admit: 2023-01-29 | Discharge: 2023-01-29 | Disposition: A | Discharge status: Home / Self Care | Payer: Medicare Other | Source: Ambulatory Visit | Attending: *Deleted | Admitting: *Deleted

## 2023-01-29 DIAGNOSIS — Z8616 Personal history of COVID-19: Secondary | ICD-10-CM | POA: Diagnosis not present

## 2023-01-29 DIAGNOSIS — I2510 Atherosclerotic heart disease of native coronary artery without angina pectoris: Secondary | ICD-10-CM | POA: Insufficient documentation

## 2023-01-29 DIAGNOSIS — J439 Emphysema, unspecified: Secondary | ICD-10-CM | POA: Insufficient documentation

## 2023-01-29 DIAGNOSIS — R9389 Abnormal findings on diagnostic imaging of other specified body structures: Secondary | ICD-10-CM | POA: Insufficient documentation

## 2023-01-29 DIAGNOSIS — R918 Other nonspecific abnormal finding of lung field: Secondary | ICD-10-CM | POA: Diagnosis not present

## 2023-01-29 DIAGNOSIS — K760 Fatty (change of) liver, not elsewhere classified: Secondary | ICD-10-CM | POA: Diagnosis not present

## 2023-01-29 DIAGNOSIS — I700 Atherosclerosis of aorta: Secondary | ICD-10-CM | POA: Diagnosis not present

## 2023-01-29 DIAGNOSIS — J432 Centrilobular emphysema: Secondary | ICD-10-CM | POA: Diagnosis not present

## 2023-01-30 ENCOUNTER — Other Ambulatory Visit: Payer: Self-pay | Admitting: Physician Assistant

## 2023-02-06 ENCOUNTER — Ambulatory Visit: Payer: Medicare Other | Admitting: Pulmonary Disease

## 2023-02-06 ENCOUNTER — Encounter: Payer: Self-pay | Admitting: Pulmonary Disease

## 2023-02-06 VITALS — BP 138/86 | HR 56 | Temp 97.1°F | Ht 63.0 in | Wt 185.2 lb

## 2023-02-06 DIAGNOSIS — R9389 Abnormal findings on diagnostic imaging of other specified body structures: Secondary | ICD-10-CM | POA: Diagnosis not present

## 2023-02-06 DIAGNOSIS — J449 Chronic obstructive pulmonary disease, unspecified: Secondary | ICD-10-CM

## 2023-02-06 DIAGNOSIS — Z8616 Personal history of COVID-19: Secondary | ICD-10-CM | POA: Diagnosis not present

## 2023-02-06 DIAGNOSIS — Z23 Encounter for immunization: Secondary | ICD-10-CM | POA: Diagnosis not present

## 2023-02-06 DIAGNOSIS — I480 Paroxysmal atrial fibrillation: Secondary | ICD-10-CM

## 2023-02-06 NOTE — Patient Instructions (Signed)
VISIT SUMMARY:  During your visit, we discussed your recent health concerns, including back pain, pneumonia, emphysema, and atrial fibrillation. We reviewed your recent CT scan results and discussed your current medications and lifestyle changes. We also talked about the importance of physical therapy and vaccinations.  YOUR PLAN:  -PNEUMONIA: Pneumonia is an infection that inflames the air sacs in one or both lungs. Your recent CT scan shows significant improvement with no current signs of pneumonia or lung cancer. We will monitor for any new symptoms and consider a repeat CT scan in three months if recommended by the radiologist.  -EMPHYSEMA: Emphysema is a lung condition that causes shortness of breath. You have chronic emphysema with residual scarring, likely due to your history of smoking and vaping, both of which you have now discontinued. It is important to continue abstaining from vaping and smoking and to monitor your lung function and symptoms.  -ARTHRITIS-RELATED BACK PAIN: Arthritis-related back pain is discomfort in the back due to inflammation of the joints. You received an injection for pain, which has now shifted to the other side. Physical therapy is recommended to strengthen your muscles and improve mobility. Continue with home exercises as tolerated.  -ATRIAL FIBRILLATION: Atrial fibrillation is an irregular and often rapid heart rate. Your condition is well-managed with your current heart medication. You prefer to follow up with your cardiologist every three months instead of six months, which we will continue to do.  -GENERAL HEALTH MAINTENANCE: We discussed the importance of receiving your flu shot and RSV vaccine to avoid overwhelming your immune system. You will receive the flu shot today and the RSV vaccine in 3-4 weeks.  INSTRUCTIONS:  Please schedule a follow-up appointment in three months. Call us if any issues arise with your CT scan results.

## 2023-02-06 NOTE — Progress Notes (Unsigned)
Subjective:    Patient ID: Anita Harrell, female    DOB: January 29, 1951, 72 y.o.   MRN: [id number]  Patient Care Team: Tower, Audrie Gallus, MD as PCP - General (Family Medicine) Mariah Milling Tollie Pizza, MD as PCP - Cardiology (Cardiology) Annamaria Helling, MD as Consulting Physician (Obstetrics and Gynecology) Salena Saner, MD as Consulting Physician (Pulmonary Disease) Doles-Johnson, Teah, NP as Nurse Practitioner (Psychiatry) Kathyrn Sheriff, [facility] (Inactive) as Pharmacist (Pharmacist)  Chief Complaint  Patient presents with   Follow-up    DOE. No wheezing or cough.     BACKGROUND/INTERVAL:Patient is a 72 year old former smoker who recently discontinued use of vaping nicotine containing substance for her enthesis October 2024), who presents for follow-up from her 14 December 2022 visit.  Recall she had a recent hospitalization at [facility] between 18 November 2022 through 25 November 2022 due to acute respiratory failure with hypoxia in the setting of pneumonia and COVID-19 infection.  She had abnormal CT scan findings due to that.  The patient resolved her acute respiratory failure symptoms however there was still concern with regards to her CT findings.  She had follow-up CT chest 29 January 2023 and she presents for discussion of next steps with regards to this test.  HPI Discussed the use of AI scribe software for clinical note transcription with the patient, who gave verbal consent to proceed.  History of Present Illness   The patient, with a history of atrial fibrillation and recurrent pneumonia, presents with back pain, which she attributes to arthritis. She reports having received an injection from a spine doctor for the pain, which has now shifted to the other side. Despite the discomfort, she expresses satisfaction with her mobility, using a walker for support.  Regarding her cardiac condition, she confirms continued use of heart medication for atrial fibrillation and reports no issues.  She recalls a recent hospitalization due to pneumonia, during which a CT scan was performed. She admits to a history of vaping, which she has since discontinued, and notes a significant reduction in coughing since cessation.  The patient also mentions difficulty with mobility, particularly getting in and out of bed, which she believes may be due to a prolonged period of bed rest. She expresses interest in physical therapy and reports attempting exercises at home, though not consistently due to needing assistance with her exercises and her husband being very busy with his schedule on their farm.  The patient's husband however states that he could assess the patient if needed.  The most recent CT chest was performed on 29 January 2023, radiologist has not read the film however this was independently reviewed and discussed in detail with the patient and her husband who accompanies her today.  The previously noted areas of concern on the CT angio of 19 November 2022 obtained during her COVID-19 infection, have now resolved.  Patient is due for flu vaccine and would like to have this today.     Review of Systems A 10 point review of systems was performed and it is as noted above otherwise negative.   Patient Active Problem List   Diagnosis Date Noted   Delirium 11/21/2022   Hyponatremia 11/19/2022   Hypophosphatemia 11/19/2022   Metabolic acidosis 11/19/2022   Bronchopneumonia 11/18/2022   (HFpEF) heart failure with preserved ejection fraction (HCC) 11/18/2022   Atrial fibrillation, chronic (HCC) 11/18/2022   Thrombocytopenia (HCC) 11/18/2022   COVID-19 11/15/2022   Sundowning 05/17/2022   Hallucinations 05/17/2022   Polypharmacy 05/17/2022  General weakness 02/16/2022   Poor balance 02/16/2022   Falling 02/16/2022   COPD with acute exacerbation (HCC) 02/13/2022   Fall at home, initial encounter 02/13/2022   Hypokalemia 02/13/2022   HTN (hypertension) 02/13/2022   Acute on chronic  diastolic CHF (congestive heart failure) (HCC) 02/13/2022   DDD (degenerative disc disease), lumbar 12/26/2021   Bilateral hip bursitis 10/05/2021   Atrial fibrillation with RVR (HCC) 10/05/2021   Cerumen impaction 06/21/2021   Estrogen deficiency 06/07/2021   Overactive bladder 10/17/2019   Medicare annual wellness visit, subsequent 01/16/2019   Thyroid nodule 05/01/2018   Electronic cigarette use 04/03/2018   Obesity (BMI 30-39.9) 04/03/2018   Lung mass 02/01/2018   Abnormal chest x-ray 01/29/2018   Smokers' cough (HCC) 01/01/2018   Microscopic hematuria 08/13/2012   Routine general medical examination at a health care facility 07/22/2012   Leukocytosis 03/30/2010   Prediabetes 02/21/2008   HYPERTENSION, BENIGN ESSENTIAL 01/22/2008   Allergic rhinitis 07/25/2007   Low back pain 06/26/2007   Personal history of goiter 07/27/2006   Hyperlipidemia 07/27/2006   PANIC DISORDER 07/27/2006   Former smoker 07/27/2006   Depression with anxiety 07/27/2006   Insomnia 07/27/2006   History of colonic polyps 06/09/2004    Social History   Tobacco Use   Smoking status: Former    Current packs/day: 0.00    Types: E-cigarettes, Cigarettes    Quit date: 2011    Years since quitting: 13.9   Smokeless tobacco: Never  Substance Use Topics   Alcohol use: No    Alcohol/week: 0.0 standard drinks of alcohol    Allergies  Allergen Reactions   Abilify [Aripiprazole]     Vision problem   Ambien [Zolpidem]     Hallucinations Ms change    Codeine Nausea And Vomiting    REACTION: Nausea and vomiting Pt has tolerated vicodin & percocet in the past   Cymbalta [Duloxetine Hcl]     More depressed/tearful    Lipitor [Atorvastatin]     Muscle pain    Vesicare [Solifenacin]     MS change   Wellbutrin [Bupropion]     Current Meds  Medication Sig   acetaminophen (TYLENOL) 500 MG tablet Take 500 mg by mouth every 6 (six) hours as needed.   albuterol (VENTOLIN HFA) 108 (90 Base) MCG/ACT  inhaler Inhale 2 puffs into the lungs every 6 (six) hours as needed for wheezing or shortness of breath.   ALPRAZolam (XANAX) 0.25 MG tablet Take by mouth at bedtime.   cyclobenzaprine (FLEXERIL) 10 MG tablet TAKE ONE HALF (1/2) TO ONE TABLET BY MOUTH AT BEDTIME AS NEEDED FOR MUSCLE SPASMS   DULoxetine (CYMBALTA) 60 MG capsule Take 60 mg by mouth daily.   ELIQUIS 5 MG TABS tablet TAKE ONE TABLET BY MOUTH TWICE A DAY   flecainide (TAMBOCOR) 100 MG tablet Take 1 tablet (100 mg total) by mouth 2 (two) times daily.   Fluticasone-Umeclidin-Vilant (TRELEGY ELLIPTA) 100-62.5-25 MCG/ACT AEPB Inhale 1 puff into the lungs daily.   furosemide (LASIX) 20 MG tablet Take 1 tablet (20 mg total) by mouth daily.   gabapentin (NEURONTIN) 300 MG capsule Take 600 mg by mouth at bedtime.   guaiFENesin-dextromethorphan (ROBITUSSIN DM) 100-10 MG/5ML syrup Take 5 mLs by mouth every 4 (four) hours as needed for cough (chest congestion).   hydrOXYzine (VISTARIL) 25 MG capsule Take 25 mg by mouth 2 (two) times daily.   Melatonin 5 MG CAPS Take by mouth.   metoprolol succinate (TOPROL-XL) 50 MG 24 hr tablet  TAKE ONE TABLET BY MOUTH TWICE A DAY WITH MEALS   mirabegron ER (MYRBETRIQ) 25 MG TB24 tablet Take 1 tablet (25 mg total) by mouth daily.   PARoxetine (PAXIL) 10 MG tablet Take 1 tablet by mouth daily.   rosuvastatin (CRESTOR) 10 MG tablet TAKE 1 TABLET BY MOUTH ONCE A DAY   spironolactone (ALDACTONE) 25 MG tablet Take 1 tablet (25 mg total) by mouth daily.   traMADol (ULTRAM) 50 MG tablet TAKE ONE (1) TO TWO (2) TABLETS BY MOUTH EVERY EIGHT HOURS AS NEEDED FOR MODERATE PAIN   traZODone (DESYREL) 50 MG tablet Take 50 mg by mouth at bedtime as needed.    Immunization History  Administered Date(s) Administered   Fluad Quad(high Dose 65+) 01/16/2019, 11/29/2021   Fluad Trivalent(High Dose 65+) 02/06/2023   Influenza Split 03/27/2011, 01/23/2012   Influenza,inj,Quad PF,6+ Mos 11/26/2012, 11/10/2013, 11/24/2016,  12/25/2017   PFIZER(Purple Top)SARS-COV-2 Vaccination 10/20/2019, 11/10/2019   Pneumococcal Conjugate-13 08/21/2014   Pneumococcal Polysaccharide-23 05/19/2008, 08/30/2015   Td 03/13/2001   Tdap 01/23/2012   Zoster Recombinant(Shingrix) 05/04/2021, 06/21/2021   Zoster, Live 01/07/2013        Objective:     BP 138/86 (BP Location: Right Arm, Cuff Size: Normal)   Pulse (!) 56   Temp (!) 97.1 F (36.2 C)   Ht 5\' 3"  (1.6 m)   Wt 185 lb 3.2 oz (84 kg)   SpO2 96%   BMI 32.81 kg/m   SpO2: 96 % O2 Device: None (Room air)  GENERAL: Obese woman, no acute distress, presents in transport chair today, no conversational dyspnea. HEAD: Normocephalic, atraumatic.  EYES: Pupils equal, round, reactive to light.  No scleral icterus.  MOUTH: Pharynx clear, oral mucosa moist. NECK: Supple. No thyromegaly. Trachea midline. No JVD.  No adenopathy. PULMONARY: Good air entry bilaterally.  No adventitious sounds. CARDIOVASCULAR: S1 and S2.  Bradycardic regular rate at 56 bpm, no cardiac murmurs discerned. ABDOMEN: Obese, otherwise benign. MUSCULOSKELETAL: No joint deformity, no clubbing, no edema.  NEUROLOGIC: No overt focal deficit, no gait disturbance, speech is fluent. SKIN: Intact,warm,dry. PSYCH: Mood and behavior normal.      Assessment & Plan:     ICD-10-CM   1. Abnormal CT of the chest  R93.89     2. Stage 1 mild COPD by GOLD classification (HCC)  J44.9     3. Paroxysmal atrial fibrillation (HCC)  I48.0     4. Personal history of COVID-19  Z86.16     5. Need for influenza vaccination  Z23 Flu Vaccine Trivalent High Dose (Fluad)      Orders Placed This Encounter  Procedures   Flu Vaccine Trivalent High Dose (Fluad)   Discussion:    Pneumonia Recent hospitalization for pneumonia D/T COVID 19. Follow-up CT scan shows significant improvement with no current signs of pneumonia or lung cancer. Residual scarring and emphysema noted. Discussed potential need for repeat scan in  three months if radiologist identifies concerns. - Monitor for new symptoms - Call if radiologist identifies concerns on CT scan - Consider repeat CT scan in three months if recommended by radiologist  Emphysema Chronic emphysema with residual scarring. History of vaping, now discontinued, leading to reduced coughing. Emphasized the importance of continued abstinence from vaping. - Encourage continued abstinence from vaping - Monitor lung function and symptoms  Arthritis-related Back Pain Chronic back pain likely due to arthritis. Recent injection from spine doctor caused pain on the opposite side. Pain may contribute to difficulty walking and muscle weakness. Discussed the  importance of physical therapy to strengthen muscles and improve mobility. Explained that injections can help manage pain to facilitate physical therapy. - Recommend physical therapy to strengthen muscles and improve mobility - Continue home exercises as tolerated  Atrial Fibrillation Chronic atrial fibrillation, well-managed with current heart medication. Prefers cardiologist follow-up every three months instead of six months. - Continue current heart medication - Follow up with cardiologist every three months  General Health Maintenance Has not received flu shot or RSV vaccine this year. Discussed importance and timing of vaccinations to avoid overwhelming the immune system. - Administer flu shot today - Recommend RSV vaccine in 3-4 weeks  Follow-up - Schedule follow-up appointment in three months - Call if any issues arise with CT scan results.      Gailen Shelter, MD Advanced Bronchoscopy PCCM [facility]    *This note was generated using voice recognition software/Dragon and/or AI transcription program.  Despite best efforts to proofread, errors can occur which can change the meaning. Any transcriptional errors that result from this process are unintentional and may not be fully  corrected at the time of dictation.

## 2023-02-12 ENCOUNTER — Other Ambulatory Visit: Payer: Self-pay | Admitting: Family Medicine

## 2023-02-13 NOTE — Telephone Encounter (Signed)
Name of Medication: Tramadol Name of Pharmacy: [facility]  Last Fill or Written Date and Quantity: 12/18/22 # 60/ 0 refills  Last Office Visit and Type: 12/04/22 Hospital f/u Next Office Visit and Type: none scheduled

## 2023-02-14 ENCOUNTER — Encounter: Payer: Medicare Other | Admitting: Family

## 2023-02-16 DIAGNOSIS — S32059A Unspecified fracture of fifth lumbar vertebra, initial encounter for closed fracture: Secondary | ICD-10-CM | POA: Diagnosis not present

## 2023-02-16 DIAGNOSIS — Z79899 Other long term (current) drug therapy: Secondary | ICD-10-CM | POA: Diagnosis not present

## 2023-02-16 DIAGNOSIS — M47816 Spondylosis without myelopathy or radiculopathy, lumbar region: Secondary | ICD-10-CM | POA: Diagnosis not present

## 2023-02-16 DIAGNOSIS — G894 Chronic pain syndrome: Secondary | ICD-10-CM | POA: Diagnosis not present

## 2023-02-16 DIAGNOSIS — Z79891 Long term (current) use of opiate analgesic: Secondary | ICD-10-CM | POA: Diagnosis not present

## 2023-02-19 ENCOUNTER — Other Ambulatory Visit: Payer: Self-pay

## 2023-02-19 DIAGNOSIS — R9389 Abnormal findings on diagnostic imaging of other specified body structures: Secondary | ICD-10-CM

## 2023-02-20 ENCOUNTER — Other Ambulatory Visit: Payer: Self-pay | Admitting: Family Medicine

## 2023-02-20 ENCOUNTER — Other Ambulatory Visit: Payer: Self-pay | Admitting: Physician Assistant

## 2023-02-20 DIAGNOSIS — F411 Generalized anxiety disorder: Secondary | ICD-10-CM | POA: Diagnosis not present

## 2023-02-20 DIAGNOSIS — F332 Major depressive disorder, recurrent severe without psychotic features: Secondary | ICD-10-CM | POA: Diagnosis not present

## 2023-02-20 DIAGNOSIS — F5101 Primary insomnia: Secondary | ICD-10-CM | POA: Diagnosis not present

## 2023-02-20 DIAGNOSIS — S32059A Unspecified fracture of fifth lumbar vertebra, initial encounter for closed fracture: Secondary | ICD-10-CM

## 2023-02-25 ENCOUNTER — Ambulatory Visit
Admission: RE | Admit: 2023-02-25 | Discharge: 2023-02-25 | Disposition: A | Discharge status: Home / Self Care | Payer: Medicare Other | Source: Ambulatory Visit | Attending: Physician Assistant | Admitting: Physician Assistant

## 2023-02-25 DIAGNOSIS — M51362 Other intervertebral disc degeneration, lumbar region with discogenic back pain and lower extremity pain: Secondary | ICD-10-CM | POA: Diagnosis not present

## 2023-02-25 DIAGNOSIS — S32059A Unspecified fracture of fifth lumbar vertebra, initial encounter for closed fracture: Secondary | ICD-10-CM

## 2023-03-05 ENCOUNTER — Other Ambulatory Visit: Payer: Self-pay | Admitting: Family Medicine

## 2023-03-06 NOTE — Telephone Encounter (Signed)
Last filled on 11/20/22 # 30 tabs/ 3 tabs  Last OV was a hospital f/u on 12/04/22

## 2023-03-11 NOTE — Progress Notes (Unsigned)
Anita Espino T. Saphyra Hutt, MD, CAQ Sports Medicine [facility] [address] [place], [address]  Phone: [phone]  FAX: [phone]  Anita Harrell - 72 y.o. female  MRN [id number]  Date of Birth: [birth year]  Date: 03/12/2023  PCP: Judy Pimple, MD  Referral: Judy Pimple, MD  Chief Complaint  Patient presents with   Burning with Urination   Subjective:   This 72 y.o. female patient presents with burning, urgency. No vaginal discharge or external irritation.  No STD exposure. No abd pain, no flank pain.   Review of Systems is noted in the HPI, as appropriate  Objective:   Blood pressure 110/80, pulse (!) 56, temperature 97.8 F (36.6 C), temperature source Temporal, height 5\' 3"  (1.6 m), weight 181 lb (82.1 kg), SpO2 94%.   GEN: WDWN HEENT: Atraumatc, normocephalic. CV: RRR, No M/G/R PULM: CTA B, No wheezes, crackles, or rhonchi ABD: S, NT, ND, +BS, no rebound. No CVAT. No suprapubic tenderness.  Objective Data: Results for orders placed or performed in visit on 03/12/23  POCT Urinalysis Dipstick (Automated)   Collection Time: 03/12/23  3:19 PM  Result Value Ref Range   Color, UA Yellow    Clarity, UA Clear    Glucose, UA Negative Negative   Bilirubin, UA Negative    Ketones, UA Negative    Spec Grav, UA 1.010 1.010 - 1.025   Blood, UA Trace    pH, UA 6.5 5.0 - 8.0   Protein, UA Positive (A) Negative   Urobilinogen, UA 0.2 0.2 or 1.0 E.U./dL   Nitrite, UA Negative    Leukocytes, UA Trace (A) Negative    Assessment and Plan:     ICD-10-CM   1. Cystitis  N30.90     2. Burning with urination  R30.0 POCT Urinalysis Dipstick (Automated)    Urine Culture     Rx with ABX as below. Drink plenty of fluids and supportive care.  Follow-up: No follow-ups on file.  Meds ordered this encounter  Medications   sulfamethoxazole-trimethoprim (BACTRIM DS) 800-160 MG tablet    Sig: Take 1 tablet by mouth 2 (two)  times daily.    Dispense:  14 tablet    Refill:  0   Orders Placed This Encounter  Procedures   Urine Culture   POCT Urinalysis Dipstick (Automated)    Signed,  Alisse Tuite T. Cole Eastridge, MD   Patient's Medications  New Prescriptions   SULFAMETHOXAZOLE-TRIMETHOPRIM (BACTRIM DS) 800-160 MG TABLET    Take 1 tablet by mouth 2 (two) times daily.  Previous Medications   ACETAMINOPHEN (TYLENOL) 500 MG TABLET    Take 500 mg by mouth every 6 (six) hours as needed.   ALBUTEROL (VENTOLIN HFA) 108 (90 BASE) MCG/ACT INHALER    Inhale 2 puffs into the lungs every 6 (six) hours as needed for wheezing or shortness of breath.   ALPRAZOLAM (XANAX) 0.25 MG TABLET    Take by mouth at bedtime.   CYCLOBENZAPRINE (FLEXERIL) 10 MG TABLET    TAKE ONE HALF (1/2) TO ONE TABLET BY MOUTH AT BEDTIME AS NEEDED FOR MUSCLE SPASMS   DULOXETINE (CYMBALTA) 60 MG CAPSULE    Take 60 mg by mouth daily.   ELIQUIS 5 MG TABS TABLET    TAKE ONE TABLET BY MOUTH TWICE A DAY   FLECAINIDE (TAMBOCOR) 100 MG TABLET    Take 1 tablet (100 mg total) by mouth 2 (two) times daily.   FLUTICASONE-UMECLIDIN-VILANT (TRELEGY ELLIPTA) 100-62.5-25 MCG/ACT  AEPB    Inhale 1 puff into the lungs daily.   FUROSEMIDE (LASIX) 20 MG TABLET    Take 1 tablet (20 mg total) by mouth daily.   GABAPENTIN (NEURONTIN) 300 MG CAPSULE    Take 600 mg by mouth at bedtime.   GUAIFENESIN-DEXTROMETHORPHAN (ROBITUSSIN DM) 100-10 MG/5ML SYRUP    Take 5 mLs by mouth every 4 (four) hours as needed for cough (chest congestion).   HYDROCODONE-ACETAMINOPHEN (NORCO/VICODIN) 5-325 MG TABLET    Take 1 tablet by mouth 3 (three) times daily as needed.   HYDROXYZINE (VISTARIL) 25 MG CAPSULE    Take 25 mg by mouth 2 (two) times daily.   MELATONIN 5 MG CAPS    Take by mouth.   METOPROLOL SUCCINATE (TOPROL-XL) 50 MG 24 HR TABLET    TAKE ONE TABLET BY MOUTH TWICE A DAY WITH MEALS   MIRABEGRON ER (MYRBETRIQ) 25 MG TB24 TABLET    Take 1 tablet (25 mg total) by mouth daily.   MIRTAZAPINE  (REMERON) 7.5 MG TABLET    Take 7.5 mg by mouth at bedtime.   PAROXETINE (PAXIL) 10 MG TABLET    Take 1 tablet by mouth daily.   ROSUVASTATIN (CRESTOR) 10 MG TABLET    TAKE 1 TABLET BY MOUTH ONCE A DAY   SPIRONOLACTONE (ALDACTONE) 25 MG TABLET    Take 1 tablet (25 mg total) by mouth daily.   TRAMADOL (ULTRAM) 50 MG TABLET    TAKE ONE (1) TO TWO (2) TABLETS BY MOUTH EVERY EIGHT HOURS AS NEEDED FOR MODERATE PAIN   TRAZODONE (DESYREL) 100 MG TABLET    Take 100 mg by mouth at bedtime as needed.  Modified Medications   No medications on file  Discontinued Medications   TRAZODONE (DESYREL) 50 MG TABLET    Take 50 mg by mouth at bedtime as needed.

## 2023-03-12 ENCOUNTER — Ambulatory Visit (INDEPENDENT_AMBULATORY_CARE_PROVIDER_SITE_OTHER): Payer: Medicare Other | Admitting: Family Medicine

## 2023-03-12 ENCOUNTER — Encounter: Payer: Self-pay | Admitting: Family Medicine

## 2023-03-12 VITALS — BP 110/80 | HR 56 | Temp 97.8°F | Ht 63.0 in | Wt 181.0 lb

## 2023-03-12 DIAGNOSIS — N3090 Cystitis, unspecified without hematuria: Secondary | ICD-10-CM

## 2023-03-12 DIAGNOSIS — R300 Dysuria: Secondary | ICD-10-CM | POA: Diagnosis not present

## 2023-03-12 LAB — POC URINALSYSI DIPSTICK (AUTOMATED)
Bilirubin, UA: NEGATIVE
Glucose, UA: NEGATIVE
Ketones, UA: NEGATIVE
Nitrite, UA: NEGATIVE
Protein, UA: POSITIVE — AB
Spec Grav, UA: 1.01 (ref 1.010–1.025)
Urobilinogen, UA: 0.2 U/dL
pH, UA: 6.5 (ref 5.0–8.0)

## 2023-03-12 MED ORDER — SULFAMETHOXAZOLE-TRIMETHOPRIM 800-160 MG PO TABS: 1.0000 | ORAL_TABLET | Freq: Two times a day (BID) | ORAL | 0 refills | Status: DC

## 2023-03-13 DIAGNOSIS — F03A3 Unspecified dementia, mild, with mood disturbance: Secondary | ICD-10-CM | POA: Diagnosis not present

## 2023-03-13 DIAGNOSIS — F411 Generalized anxiety disorder: Secondary | ICD-10-CM | POA: Diagnosis not present

## 2023-03-13 DIAGNOSIS — F332 Major depressive disorder, recurrent severe without psychotic features: Secondary | ICD-10-CM | POA: Diagnosis not present

## 2023-03-13 DIAGNOSIS — F5101 Primary insomnia: Secondary | ICD-10-CM | POA: Diagnosis not present

## 2023-03-15 LAB — URINE CULTURE
MICRO NUMBER:: 15900107
SPECIMEN QUALITY:: ADEQUATE

## 2023-03-16 DIAGNOSIS — M47816 Spondylosis without myelopathy or radiculopathy, lumbar region: Secondary | ICD-10-CM | POA: Diagnosis not present

## 2023-03-20 ENCOUNTER — Other Ambulatory Visit: Payer: Self-pay | Admitting: Pulmonary Disease

## 2023-03-21 ENCOUNTER — Other Ambulatory Visit: Payer: Self-pay | Admitting: Cardiovascular Disease

## 2023-03-21 NOTE — Telephone Encounter (Signed)
 Please review

## 2023-03-21 NOTE — Telephone Encounter (Signed)
 Prescription refill request for Eliquis received. Indication: AF Last office visit: 12/19/22  Concha Se MD Scr: 0.71 on 12/04/22  Epic Age: 73 Weight: 85kg  Based on above findings Eliquis 5mg  twice daily is the appropriate dose.  Refill approved.

## 2023-03-28 DIAGNOSIS — M5416 Radiculopathy, lumbar region: Secondary | ICD-10-CM | POA: Diagnosis not present

## 2023-04-02 DIAGNOSIS — J449 Chronic obstructive pulmonary disease, unspecified: Secondary | ICD-10-CM | POA: Diagnosis not present

## 2023-04-03 ENCOUNTER — Ambulatory Visit (INDEPENDENT_AMBULATORY_CARE_PROVIDER_SITE_OTHER): Payer: Medicare Other | Admitting: Family Medicine

## 2023-04-03 ENCOUNTER — Encounter: Payer: Self-pay | Admitting: Family Medicine

## 2023-04-03 ENCOUNTER — Other Ambulatory Visit: Payer: Self-pay | Admitting: Family Medicine

## 2023-04-03 VITALS — BP 118/80 | HR 65 | Temp 97.6°F | Ht 63.0 in | Wt 181.4 lb

## 2023-04-03 DIAGNOSIS — M51362 Other intervertebral disc degeneration, lumbar region with discogenic back pain and lower extremity pain: Secondary | ICD-10-CM

## 2023-04-03 DIAGNOSIS — I480 Paroxysmal atrial fibrillation: Secondary | ICD-10-CM | POA: Diagnosis not present

## 2023-04-03 DIAGNOSIS — R300 Dysuria: Secondary | ICD-10-CM | POA: Diagnosis not present

## 2023-04-03 DIAGNOSIS — J4489 Other specified chronic obstructive pulmonary disease: Secondary | ICD-10-CM | POA: Insufficient documentation

## 2023-04-03 DIAGNOSIS — N3281 Overactive bladder: Secondary | ICD-10-CM | POA: Diagnosis not present

## 2023-04-03 DIAGNOSIS — J449 Chronic obstructive pulmonary disease, unspecified: Secondary | ICD-10-CM | POA: Insufficient documentation

## 2023-04-03 DIAGNOSIS — F5104 Psychophysiologic insomnia: Secondary | ICD-10-CM | POA: Diagnosis not present

## 2023-04-03 DIAGNOSIS — N3000 Acute cystitis without hematuria: Secondary | ICD-10-CM

## 2023-04-03 LAB — POCT UA - MICROSCOPIC ONLY

## 2023-04-03 LAB — POC URINALSYSI DIPSTICK (AUTOMATED)
Spec Grav, UA: >=1.03 — AB (ref 1.010–1.025)
pH, UA: 6 (ref 5.0–8.0)

## 2023-04-03 MED ORDER — CEPHALEXIN 500 MG PO CAPS
500.0000 mg | ORAL_CAPSULE | Freq: Two times a day (BID) | ORAL | 0 refills | Status: DC
Start: 2023-04-03 — End: 2023-05-03

## 2023-04-03 NOTE — Assessment & Plan Note (Signed)
Under care of cardiology/ Dr Mariah Milling  No clinical changes Per card note Maintaining normal sinus rhythm on Eliquis, metoprolol succinate 50 daily, flecainide 100 twice daily

## 2023-04-03 NOTE — Telephone Encounter (Signed)
Last filled on 03/06/23 # 30 tabs/ 0 refills   Pt has an acute appt today at 2pm with PCP

## 2023-04-03 NOTE — Assessment & Plan Note (Signed)
Pt continues specialty care with pain control and gabapentin  Occational takes cyclobenzaprine for spasm/muscle tightness  Will refill this for prn use  Cautioned re: sedation/fall risk  Stressed importance that she NOT take this as a sleep aide  To discuss sleep issues with her psychiatrist

## 2023-04-03 NOTE — Progress Notes (Signed)
Subjective:    Patient ID: Anita Harrell, female    DOB: January 22, 1951, 73 y.o.   MRN: [id number]  HPI  Wt Readings from Last 3 Encounters:  04/03/23 181 lb 6 oz (82.3 kg)  03/12/23 181 lb (82.1 kg)  02/06/23 185 lb 3.2 oz (84 kg)   32.13 kg/m  Vitals:   04/03/23 1354  BP: 118/80  Pulse: 65  Temp: 97.6 F (36.4 C)  SpO2: 93%    Pt presents with urinary symptoms     Had urine culture positive for klebsiella at end of December Resistant to amp Was treatment with septra by Dr Patsy Lager  Her symptoms did completely resolve  Better for a week  Is pretty good at drinking fluids   Some urinary symptoms started this am  Burning to urinate   Took azo  No new frequency / that is baseline  No blood in urine  Urgency is also normal for her  No bladder pain or pressure  No flank pain  No fever or n/v     On myrbetiq for overactive bladder   Has flexeril for prn  Pharmacy just requested   Results for orders placed or performed in visit on 04/03/23  POCT Urinalysis Dipstick (Automated)   Collection Time: 04/03/23  2:10 PM  Result Value Ref Range   Color, UA Orange    Clarity, UA Hazy    Glucose, UA     Bilirubin, UA     Ketones, UA     Spec Grav, UA >=1.030 (A) 1.010 - 1.025   Blood, UA     pH, UA 6.0 5.0 - 8.0   Protein, UA     Urobilinogen, UA     Nitrite, UA     Leukocytes, UA    POCT UA - Microscopic Only   Collection Time: 04/03/23  2:19 PM  Result Value Ref Range   WBC, Ur, HPF, POC 4-6 0 - 5   RBC, Urine, Miroscopic few 0 - 2   Bacteria, U Microscopic many None - Trace   Mucus, UA few    Epithelial cells, urine per micros few    Crystals, Ur, HPF, POC none    Casts, Ur, LPF, POC none    Yeast, UA none     Lab Results  Component Value Date   NA 135 12/04/2022   K 3.9 12/04/2022   CO2 26 12/04/2022   GLUCOSE 106 (H) 12/04/2022   BUN 12 12/04/2022   CREATININE 0.71 12/04/2022   CALCIUM 9.1 12/04/2022   GFR 84.15 12/04/2022   GFRNONAA  >60 11/25/2022      Patient Active Problem List   Diagnosis Date Noted   COPD with asthma (HCC) 04/03/2023   Hyponatremia 11/19/2022   Hypophosphatemia 11/19/2022   Metabolic acidosis 11/19/2022   (HFpEF) heart failure with preserved ejection fraction (HCC) 11/18/2022   Paroxysmal atrial fibrillation (HCC) 11/18/2022   Thrombocytopenia (HCC) 11/18/2022   Hallucinations 05/17/2022   Polypharmacy 05/17/2022   General weakness 02/16/2022   Poor balance 02/16/2022   Falling 02/16/2022   Fall at home, initial encounter 02/13/2022   Hypokalemia 02/13/2022   HTN (hypertension) 02/13/2022   DDD (degenerative disc disease), lumbar 12/26/2021   Bilateral hip bursitis 10/05/2021   Atrial fibrillation with RVR (HCC) 10/05/2021   Estrogen deficiency 06/07/2021   Overactive bladder 10/17/2019   Medicare annual wellness visit, subsequent 01/16/2019   Thyroid nodule 05/01/2018   Electronic cigarette use 04/03/2018   Obesity (BMI 30-39.9) 04/03/2018  Lung mass 02/01/2018   Smokers' cough (HCC) 01/01/2018   Microscopic hematuria 08/13/2012   UTI (urinary tract infection) 07/22/2012   Routine general medical examination at a health care facility 07/22/2012   Leukocytosis 03/30/2010   Prediabetes 02/21/2008   HYPERTENSION, BENIGN ESSENTIAL 01/22/2008   Allergic rhinitis 07/25/2007   Low back pain 06/26/2007   Personal history of goiter 07/27/2006   Hyperlipidemia 07/27/2006   PANIC DISORDER 07/27/2006   Former smoker 07/27/2006   Depression with anxiety 07/27/2006   Insomnia 07/27/2006   History of colonic polyps 06/09/2004   Past Medical History:  Diagnosis Date   Allergy    allergic rhinitis   Anxiety    Arrhythmia    atrial fibrillation   Back pain    Chest pain    CHF (congestive heart failure) (HCC)    COPD (chronic obstructive pulmonary disease) (HCC)    Depression    Epigastric pain    Goiter    History of tobacco abuse    Hyperglycemia    mild   Hyperlipidemia     Hypertension    Insomnia    Hx of   Labile blood pressure    Panic disorder    History of   Personal history of colonic polyps 06/09/2004   SVD (spontaneous vaginal delivery)    x 2   Past Surgical History:  Procedure Laterality Date   ABDOMINAL HYSTERECTOMY N/A 09/24/2012   Procedure: HYSTERECTOMY ABDOMINAL;  Surgeon: Miguel Aschoff, MD;  Location: [facility];  Service: Gynecology;  Laterality: N/A;   BREAST BIOPSY Right 2016   CARDIOVERSION N/A 02/09/2022   Procedure: CARDIOVERSION;  Surgeon: Antonieta Iba, MD;  Location: [facility];  Service: Cardiovascular;  Laterality: N/A;   CARDIOVERSION N/A 03/02/2022   Procedure: CARDIOVERSION;  Surgeon: Antonieta Iba, MD;  Location: [facility];  Service: Cardiovascular;  Laterality: N/A;   COLONOSCOPY     COLONOSCOPY  2017   ELBOW SURGERY  2004   EYE SURGERY     bilateral lasik   SALPINGOOPHORECTOMY Bilateral 09/24/2012   Procedure: SALPINGO OOPHORECTOMY;  Surgeon: Miguel Aschoff, MD;  Location: [facility];  Service: Gynecology;  Laterality: Bilateral;   THYROID SURGERY     B9 massess   WART FULGURATION N/A 09/24/2012   Procedure: FULGURATION VAGINAL WART;  Surgeon: Miguel Aschoff, MD;  Location: [facility];  Service: Gynecology;  Laterality: N/A;   WISDOM TOOTH EXTRACTION     Social History   Tobacco Use   Smoking status: Former    Current packs/day: 0.00    Types: E-cigarettes, Cigarettes    Quit date: 2011    Years since quitting: 14.0   Smokeless tobacco: Never  Vaping Use   Vaping status: Former   Quit date: 11/18/2022  Substance Use Topics   Alcohol use: No    Alcohol/week: 0.0 standard drinks of alcohol   Drug use: No   Family History  Problem Relation Age of Onset   Hypertension Mother    Stroke Mother    Alcohol abuse Father    Cancer Father        lung CA   Heart disease Father        CHF   Cancer Sister        breast CA   Heart disease Sister        CHF from chemotx also has defib   Breast cancer Sister    Colon cancer  Neg Hx    Esophageal cancer Neg Hx  Rectal cancer Neg Hx    Stomach cancer Neg Hx    Allergies  Allergen Reactions   Abilify [Aripiprazole]     Vision problem   Ambien [Zolpidem]     Hallucinations Ms change    Codeine Nausea And Vomiting    REACTION: Nausea and vomiting Pt has tolerated vicodin & percocet in the past   Cymbalta [Duloxetine Hcl]     More depressed/tearful    Lipitor [Atorvastatin]     Muscle pain    Vesicare [Solifenacin]     MS change   Wellbutrin [Bupropion]    Current Outpatient Medications on File Prior to Visit  Medication Sig Dispense Refill   acetaminophen (TYLENOL) 500 MG tablet Take 500 mg by mouth every 6 (six) hours as needed.     albuterol (VENTOLIN HFA) 108 (90 Base) MCG/ACT inhaler Inhale 2 puffs into the lungs every 6 (six) hours as needed for wheezing or shortness of breath. 8 g 2   ALPRAZolam (XANAX) 0.25 MG tablet Take by mouth at bedtime.     DULoxetine (CYMBALTA) 60 MG capsule Take 60 mg by mouth daily.     ELIQUIS 5 MG TABS tablet TAKE ONE TABLET BY MOUTH TWICE A DAY 60 tablet 5   flecainide (TAMBOCOR) 100 MG tablet Take 1 tablet (100 mg total) by mouth 2 (two) times daily. 180 tablet 3   gabapentin (NEURONTIN) 300 MG capsule Take 600 mg by mouth at bedtime.     HYDROcodone-acetaminophen (NORCO/VICODIN) 5-325 MG tablet Take 1 tablet by mouth 3 (three) times daily as needed.     hydrOXYzine (VISTARIL) 25 MG capsule Take 25 mg by mouth 2 (two) times daily.     Melatonin 5 MG CAPS Take by mouth.     metoprolol succinate (TOPROL-XL) 50 MG 24 hr tablet TAKE ONE TABLET BY MOUTH TWICE A DAY WITH MEALS 180 tablet 3   mirabegron ER (MYRBETRIQ) 25 MG TB24 tablet Take 1 tablet (25 mg total) by mouth daily. 90 tablet 3   mirtazapine (REMERON) 7.5 MG tablet Take 7.5 mg by mouth at bedtime.     PARoxetine (PAXIL) 10 MG tablet Take 1 tablet by mouth daily.     rosuvastatin (CRESTOR) 10 MG tablet TAKE 1 TABLET BY MOUTH ONCE A DAY 90 tablet 3    spironolactone (ALDACTONE) 25 MG tablet Take 1 tablet (25 mg total) by mouth daily. 30 tablet 0   traMADol (ULTRAM) 50 MG tablet TAKE ONE (1) TO TWO (2) TABLETS BY MOUTH EVERY EIGHT HOURS AS NEEDED FOR MODERATE PAIN 60 tablet 0   traZODone (DESYREL) 100 MG tablet Take 100 mg by mouth at bedtime as needed.     TRELEGY ELLIPTA 100-62.5-25 MCG/ACT AEPB INHALE ONE PUFF INTO THE LUNGS DAILY 60 each 11   furosemide (LASIX) 20 MG tablet Take 1 tablet (20 mg total) by mouth daily. 90 tablet 3   No current facility-administered medications on file prior to visit.    Review of Systems  Constitutional:  Negative for activity change, appetite change, fatigue, fever and unexpected weight change.  HENT:  Negative for congestion, ear pain, rhinorrhea, sinus pressure and sore throat.   Eyes:  Negative for pain, redness and visual disturbance.  Respiratory:  Negative for cough, shortness of breath and wheezing.   Cardiovascular:  Negative for chest pain and palpitations.  Gastrointestinal:  Negative for abdominal pain, blood in stool, constipation and diarrhea.  Endocrine: Negative for polydipsia and polyuria.  Genitourinary:  Positive for dysuria, frequency and  urgency. Negative for decreased urine volume, hematuria and vaginal discharge.  Musculoskeletal:  Negative for arthralgias, back pain and myalgias.  Skin:  Negative for pallor and rash.  Allergic/Immunologic: Negative for environmental allergies.  Neurological:  Negative for dizziness, syncope and headaches.  Hematological:  Negative for adenopathy. Does not bruise/bleed easily.  Psychiatric/Behavioral:  Positive for sleep disturbance. Negative for decreased concentration and dysphoric mood. The patient is nervous/anxious.        Objective:   Physical Exam        Assessment & Plan:   Problem List Items Addressed This Visit       Cardiovascular and Mediastinum   Paroxysmal atrial fibrillation (HCC)   Under care of cardiology/ Dr Mariah Milling   No clinical changes Per card note Maintaining normal sinus rhythm on Eliquis, metoprolol succinate 50 daily, flecainide 100 twice daily         Respiratory   COPD with asthma (HCC)   Continues pulmonary follow up  Trellegy ellipta 100-62.5-25 mg one puff daily  Overall stable         Musculoskeletal and Integument   DDD (degenerative disc disease), lumbar   Pt continues specialty care with pain control and gabapentin  Occational takes cyclobenzaprine for spasm/muscle tightness  Will refill this for prn use  Cautioned re: sedation/fall risk  Stressed importance that she NOT take this as a sleep aide  To discuss sleep issues with her psychiatrist         Genitourinary   UTI (urinary tract infection) - Primary   Symptoms started this am  Recently had klebsiella uti treated with bactrim ds by Dr Patsy Lager (note and plan reviewed along with urinalysis and culture) and was symptom free for over a week   Does take myrbetriq for overactive bladder and incontinence  Does not think this prevents full emptying of bladder  Reassuring exam No fever or ms change or flank pain  Urinalysis not accurate due to use of azo  Bacterial and wbc noted on micro   Prescribed keflex 500 mg bid  Pending culture  Instructed to call if symptoms worsen in meantime       Relevant Medications   cephALEXin (KEFLEX) 500 MG capsule   Other Relevant Orders   POCT UA - Microscopic Only (Completed)   Urine Culture   Overactive bladder   Currently takes myrbetriq 25 mg daily  Is helpful  Want to watch for urinary retention  Has another uti today   Low threshold to refer back to urology         Other   Insomnia   Having more problems lately  Wakes and cannot go back to sleep Under care of psychiatry  For depression /anxiety Cymbalta 60 mg daily  Paxil 10 mg daily  Mirtazaine 7.5 mg at bedtime  For sleep Trazodone 100 mg at bedtime prn  Melatonin 5 mg prn  Hydroxyzine prn bid anxiety  or sleep Xanax 0.25 mg daily at bedtime prn   Encouraged pt to discuss further with psychiatry   Also use caution of sedation cognitive changes with all of her medicine as this is a lot        Other Visit Diagnoses       Dysuria       Relevant Orders   POCT Urinalysis Dipstick (Automated) (Completed)

## 2023-04-03 NOTE — Assessment & Plan Note (Addendum)
Having more problems lately  Wakes and cannot go back to sleep Under care of psychiatry  For depression /anxiety Cymbalta 60 mg daily  Paxil 10 mg daily  Mirtazaine 7.5 mg at bedtime  For sleep Trazodone 100 mg at bedtime prn  Melatonin 5 mg prn  Hydroxyzine prn bid anxiety or sleep Xanax 0.25 mg daily at bedtime prn   Encouraged pt to discuss further with psychiatry   Also use caution of sedation cognitive changes with all of her medicine as this is a lot

## 2023-04-03 NOTE — Assessment & Plan Note (Signed)
Currently takes myrbetriq 25 mg daily  Is helpful  Want to watch for urinary retention  Has another uti today   Low threshold to refer back to urology

## 2023-04-03 NOTE — Assessment & Plan Note (Signed)
Symptoms started this am  Recently had klebsiella uti treated with bactrim ds by Dr Patsy Lager (note and plan reviewed along with urinalysis and culture) and was symptom free for over a week   Does take myrbetriq for overactive bladder and incontinence  Does not think this prevents full emptying of bladder  Reassuring exam No fever or ms change or flank pain  Urinalysis not accurate due to use of azo  Bacterial and wbc noted on micro   Prescribed keflex 500 mg bid  Pending culture  Instructed to call if symptoms worsen in meantime

## 2023-04-03 NOTE — Patient Instructions (Addendum)
Take generic keflex as directed  Drink water   We will call with urine culture result when it returns  If symptoms worsen in meantime   Try to empty when you urinate -take your time  Also wipe front to back to prevent utis    I refilled flexeril (cyclobenzaprine) for back spasm /back muscle pain as needed Caution of sedation and falls with this  This medicine is not for sleep   Talk to your psychiatrist about sleep

## 2023-04-03 NOTE — Assessment & Plan Note (Signed)
Continues pulmonary follow up  Trellegy ellipta 100-62.5-25 mg one puff daily  Overall stable

## 2023-04-05 ENCOUNTER — Encounter: Payer: Self-pay | Admitting: Family Medicine

## 2023-04-05 LAB — URINE CULTURE
MICRO NUMBER:: 15981751
SPECIMEN QUALITY:: ADEQUATE

## 2023-04-10 DIAGNOSIS — F5101 Primary insomnia: Secondary | ICD-10-CM | POA: Diagnosis not present

## 2023-04-10 DIAGNOSIS — F411 Generalized anxiety disorder: Secondary | ICD-10-CM | POA: Diagnosis not present

## 2023-04-10 DIAGNOSIS — F332 Major depressive disorder, recurrent severe without psychotic features: Secondary | ICD-10-CM | POA: Diagnosis not present

## 2023-04-10 DIAGNOSIS — F03A3 Unspecified dementia, mild, with mood disturbance: Secondary | ICD-10-CM | POA: Diagnosis not present

## 2023-04-13 ENCOUNTER — Telehealth: Payer: Self-pay

## 2023-04-13 DIAGNOSIS — G4730 Sleep apnea, unspecified: Secondary | ICD-10-CM

## 2023-04-13 NOTE — Telephone Encounter (Signed)
ONO reviewed by Dr. Jayme Cloud- Low SpO2 77%. Patient has significant desaturations and a see-saw pattern of desats. Recommend Home Sleep Study.     I have notified the patient and placed the order for the home sleep study.  Nothing further needed.

## 2023-04-24 ENCOUNTER — Other Ambulatory Visit: Payer: Self-pay | Admitting: Physician Assistant

## 2023-04-24 DIAGNOSIS — M4316 Spondylolisthesis, lumbar region: Secondary | ICD-10-CM | POA: Diagnosis not present

## 2023-04-24 DIAGNOSIS — S22089A Unspecified fracture of T11-T12 vertebra, initial encounter for closed fracture: Secondary | ICD-10-CM

## 2023-04-24 DIAGNOSIS — M5416 Radiculopathy, lumbar region: Secondary | ICD-10-CM

## 2023-04-24 DIAGNOSIS — S32059A Unspecified fracture of fifth lumbar vertebra, initial encounter for closed fracture: Secondary | ICD-10-CM | POA: Diagnosis not present

## 2023-04-24 DIAGNOSIS — M791 Myalgia, unspecified site: Secondary | ICD-10-CM | POA: Diagnosis not present

## 2023-04-24 DIAGNOSIS — M47816 Spondylosis without myelopathy or radiculopathy, lumbar region: Secondary | ICD-10-CM | POA: Diagnosis not present

## 2023-04-25 ENCOUNTER — Other Ambulatory Visit: Payer: Self-pay | Admitting: Orthopaedic Surgery

## 2023-04-25 ENCOUNTER — Telehealth: Payer: Self-pay | Admitting: Pulmonary Disease

## 2023-04-25 DIAGNOSIS — S32059A Unspecified fracture of fifth lumbar vertebra, initial encounter for closed fracture: Secondary | ICD-10-CM

## 2023-04-25 DIAGNOSIS — M5416 Radiculopathy, lumbar region: Secondary | ICD-10-CM

## 2023-04-25 DIAGNOSIS — M4316 Spondylolisthesis, lumbar region: Secondary | ICD-10-CM

## 2023-04-25 DIAGNOSIS — S22089A Unspecified fracture of T11-T12 vertebra, initial encounter for closed fracture: Secondary | ICD-10-CM

## 2023-04-25 NOTE — Telephone Encounter (Signed)
Husband calling asking how many nights she needs to do the sleep study they just got in the mail. [phone]

## 2023-04-25 NOTE — Telephone Encounter (Signed)
Lmtcb

## 2023-04-25 NOTE — Telephone Encounter (Signed)
Let pt know to do 2-3 nights. They are aware. Nothing further needed

## 2023-04-27 ENCOUNTER — Encounter

## 2023-04-27 DIAGNOSIS — G4730 Sleep apnea, unspecified: Secondary | ICD-10-CM

## 2023-04-27 DIAGNOSIS — G4733 Obstructive sleep apnea (adult) (pediatric): Secondary | ICD-10-CM | POA: Diagnosis not present

## 2023-05-01 ENCOUNTER — Other Ambulatory Visit: Payer: Self-pay | Admitting: Family Medicine

## 2023-05-01 NOTE — Telephone Encounter (Signed)
Last OV was an acute appt on 04/03/23, last filled on 04/03/23 #30 tabs/ 0 refills

## 2023-05-01 NOTE — Telephone Encounter (Signed)
I refilled once  She is on a lot of sedating medicine so I only want her to take this if absolutely needed  Already has gabapentin and mirtazapine and tramadol and trazodone and hydrocodone and alprazolam  This worries me a bit and I think we have discussed it in office before   I assume she is taking this for her back  Perhaps her back specialist can recommend something safer down the road

## 2023-05-03 NOTE — Telephone Encounter (Signed)
Pt notified of Dr. Royden Purl comments. Pt said she fell on top of her chronic pain/ problems and she is already seeing a specialist who ordered a MRI of her back that she is getting tomorrow ( I can see it pending in epic). Pt said once they get the results of her MRI her back specialist plan on taking over care so she thinks this will be the last refill PCP has to do  FYI to PCP

## 2023-05-04 ENCOUNTER — Ambulatory Visit
Admission: RE | Admit: 2023-05-04 | Discharge: 2023-05-04 | Disposition: A | Discharge status: Home / Self Care | Payer: Medicare Other | Source: Ambulatory Visit | Attending: Orthopaedic Surgery | Admitting: Orthopaedic Surgery

## 2023-05-04 DIAGNOSIS — M5416 Radiculopathy, lumbar region: Secondary | ICD-10-CM

## 2023-05-04 DIAGNOSIS — S22089A Unspecified fracture of T11-T12 vertebra, initial encounter for closed fracture: Secondary | ICD-10-CM

## 2023-05-04 DIAGNOSIS — M4316 Spondylolisthesis, lumbar region: Secondary | ICD-10-CM

## 2023-05-04 DIAGNOSIS — S32059A Unspecified fracture of fifth lumbar vertebra, initial encounter for closed fracture: Secondary | ICD-10-CM

## 2023-05-08 DIAGNOSIS — F411 Generalized anxiety disorder: Secondary | ICD-10-CM | POA: Diagnosis not present

## 2023-05-08 DIAGNOSIS — F332 Major depressive disorder, recurrent severe without psychotic features: Secondary | ICD-10-CM | POA: Diagnosis not present

## 2023-05-10 ENCOUNTER — Encounter: Payer: Self-pay | Admitting: Pulmonary Disease

## 2023-05-10 ENCOUNTER — Ambulatory Visit: Payer: Medicare Other | Admitting: Pulmonary Disease

## 2023-05-10 VITALS — BP 116/60 | HR 63 | Temp 96.8°F | Ht 63.0 in | Wt 175.0 lb

## 2023-05-10 DIAGNOSIS — J449 Chronic obstructive pulmonary disease, unspecified: Secondary | ICD-10-CM

## 2023-05-10 DIAGNOSIS — I480 Paroxysmal atrial fibrillation: Secondary | ICD-10-CM

## 2023-05-10 DIAGNOSIS — G4730 Sleep apnea, unspecified: Secondary | ICD-10-CM | POA: Diagnosis not present

## 2023-05-10 DIAGNOSIS — R9389 Abnormal findings on diagnostic imaging of other specified body structures: Secondary | ICD-10-CM

## 2023-05-10 NOTE — Progress Notes (Signed)
 Subjective:    Patient ID: Anita Harrell, female    DOB: [birth year], 73 y.o.   MRN: [id number]  Patient Care Team: Tower, Audrie Gallus, MD as PCP - General (Family Medicine) Mariah Milling Tollie Pizza, MD as PCP - Cardiology (Cardiology) Annamaria Helling, MD as Consulting Physician (Obstetrics and Gynecology) Salena Saner, MD as Consulting Physician (Pulmonary Disease) Doles-Johnson, Teah, NP as Nurse Practitioner (Psychiatry) Kathyrn Sheriff, [facility] (Inactive) as Pharmacist (Pharmacist)  Chief Complaint  Patient presents with   Follow-up    DOE. No wheezing or cough.     BACKGROUND/INTERVAL: Patient is a 73 year old former smoker who discontinued use of vaping nicotine containing substance (October 2024), who presents for follow-up from her 14 December 2022 visit.  Recall she had a recent hospitalization at [facility] between 18 November 2022 through 25 November 2022 due to acute respiratory failure with hypoxia in the setting of pneumonia and COVID-19 infection.  She had abnormal CT scan findings due to that.  The patient resolved her acute respiratory failure symptoms however there was still concern with regards to her CT findings.  She had follow-up CT chest 29 January 2023 and this showed resolution of the previously noted right lower lobe masslike opacity and a persistent groundglass nodule within the right apex which measures 7 mm.  Patient has follow-up CT ordered.  Sleep study performed results of which are not back yet.  HPI Discussed the use of AI scribe software for clinical note transcription with the patient, who gave verbal consent to proceed.  History of Present Illness   SHAWNAE LEIVA is a 73 year old female with COPD who presents with shortness of breath with exertion and a lung nodule.  She experiences shortness of breath on exertion but denies any cough. She is currently using Trelegy, which effectively manages her symptoms. She is not smoking or vaping, although she uses a  pencil as a substitute for the habit. A lung nodule is being monitored, with plans for a repeat scan in December.  Approximately two weeks ago, she fell while attempting to pick up her dog, resulting in a back injury. She has compression fractures at the L5 vertebra, identified on a prior CT scan. An MRI has been performed, but the results have not yet been reviewed. She has been receiving injections for spinal arthritis and is concerned about the possibility of needing surgery, although she prefers to avoid it if possible.  She has severe arthritis in her spine, contributing to back pain. She is not currently taking any medications specifically for bone building and has a history of lumbar fractures, raising concerns about the progression of her spinal condition.     Review of Systems A 10 point review of systems was performed and it is as noted above otherwise negative.   Patient Active Problem List   Diagnosis Date Noted   COPD with asthma (HCC) 04/03/2023   Hyponatremia 11/19/2022   Hypophosphatemia 11/19/2022   Metabolic acidosis 11/19/2022   (HFpEF) heart failure with preserved ejection fraction (HCC) 11/18/2022   Paroxysmal atrial fibrillation (HCC) 11/18/2022   Thrombocytopenia (HCC) 11/18/2022   Hallucinations 05/17/2022   Polypharmacy 05/17/2022   General weakness 02/16/2022   Poor balance 02/16/2022   Falling 02/16/2022   Fall at home, initial encounter 02/13/2022   Hypokalemia 02/13/2022   HTN (hypertension) 02/13/2022   DDD (degenerative disc disease), lumbar 12/26/2021   Bilateral hip bursitis 10/05/2021   Atrial fibrillation with RVR (HCC) 10/05/2021   Estrogen deficiency 06/07/2021  Overactive bladder 10/17/2019   Medicare annual wellness visit, subsequent 01/16/2019   Thyroid nodule 05/01/2018   Electronic cigarette use 04/03/2018   Obesity (BMI 30-39.9) 04/03/2018   Lung mass 02/01/2018   Smokers' cough (HCC) 01/01/2018   Microscopic hematuria 08/13/2012   UTI  (urinary tract infection) 07/22/2012   Routine general medical examination at a health care facility 07/22/2012   Leukocytosis 03/30/2010   Prediabetes 02/21/2008   HYPERTENSION, BENIGN ESSENTIAL 01/22/2008   Allergic rhinitis 07/25/2007   Low back pain 06/26/2007   Personal history of goiter 07/27/2006   Hyperlipidemia 07/27/2006   PANIC DISORDER 07/27/2006   Former smoker 07/27/2006   Depression with anxiety 07/27/2006   Insomnia 07/27/2006   History of colonic polyps 06/09/2004    Social History   Tobacco Use   Smoking status: Former    Current packs/day: 0.00    Types: E-cigarettes, Cigarettes    Quit date: 2011    Years since quitting: 14.1   Smokeless tobacco: Never  Substance Use Topics   Alcohol use: No    Alcohol/week: 0.0 standard drinks of alcohol    Allergies  Allergen Reactions   Abilify [Aripiprazole]     Vision problem   Ambien [Zolpidem]     Hallucinations Ms change    Codeine Nausea And Vomiting    REACTION: Nausea and vomiting Pt has tolerated vicodin & percocet in the past   Cymbalta [Duloxetine Hcl]     More depressed/tearful    Lipitor [Atorvastatin]     Muscle pain    Vesicare [Solifenacin]     MS change   Wellbutrin [Bupropion]     Current Meds  Medication Sig   acetaminophen (TYLENOL) 500 MG tablet Take 500 mg by mouth every 6 (six) hours as needed.   albuterol (VENTOLIN HFA) 108 (90 Base) MCG/ACT inhaler Inhale 2 puffs into the lungs every 6 (six) hours as needed for wheezing or shortness of breath.   ALPRAZolam (XANAX) 0.5 MG tablet Take 0.5 mg by mouth 2 (two) times daily as needed.   cyclobenzaprine (FLEXERIL) 10 MG tablet TAKE ONE HALF (1/2) TO ONE TABLET BY MOUTH AT BEDTIME AS NEEDED FOR MUSCLE SPASMS   DULoxetine (CYMBALTA) 60 MG capsule Take 60 mg by mouth daily.   ELIQUIS 5 MG TABS tablet TAKE ONE TABLET BY MOUTH TWICE A DAY   flecainide (TAMBOCOR) 100 MG tablet Take 1 tablet (100 mg total) by mouth 2 (two) times daily.    gabapentin (NEURONTIN) 300 MG capsule Take 600 mg by mouth at bedtime.   HYDROcodone-acetaminophen (NORCO/VICODIN) 5-325 MG tablet Take 1 tablet by mouth 3 (three) times daily as needed.   hydrOXYzine (VISTARIL) 25 MG capsule Take 25 mg by mouth 2 (two) times daily.   Melatonin 5 MG CAPS Take by mouth.   metoprolol succinate (TOPROL-XL) 50 MG 24 hr tablet TAKE ONE TABLET BY MOUTH TWICE A DAY WITH MEALS   mirabegron ER (MYRBETRIQ) 25 MG TB24 tablet Take 1 tablet (25 mg total) by mouth daily.   mirtazapine (REMERON) 7.5 MG tablet Take 7.5 mg by mouth at bedtime.   PARoxetine (PAXIL) 10 MG tablet Take 1 tablet by mouth daily.   rosuvastatin (CRESTOR) 10 MG tablet TAKE 1 TABLET BY MOUTH ONCE A DAY   spironolactone (ALDACTONE) 25 MG tablet Take 1 tablet (25 mg total) by mouth daily.   traMADol (ULTRAM) 50 MG tablet TAKE ONE (1) TO TWO (2) TABLETS BY MOUTH EVERY EIGHT HOURS AS NEEDED FOR MODERATE PAIN  traZODone (DESYREL) 100 MG tablet Take 100 mg by mouth at bedtime as needed.   TRELEGY ELLIPTA 100-62.5-25 MCG/ACT AEPB INHALE ONE PUFF INTO THE LUNGS DAILY    Immunization History  Administered Date(s) Administered   Fluad Quad(high Dose 65+) 01/16/2019, 11/29/2021   Fluad Trivalent(High Dose 65+) 02/06/2023   Influenza Split 03/27/2011, 01/23/2012   Influenza,inj,Quad PF,6+ Mos 11/26/2012, 11/10/2013, 11/24/2016, 12/25/2017   PFIZER(Purple Top)SARS-COV-2 Vaccination 10/20/2019, 11/10/2019   Pneumococcal Conjugate-13 08/21/2014   Pneumococcal Polysaccharide-23 05/19/2008, 08/30/2015   Td 03/13/2001   Tdap 01/23/2012   Zoster Recombinant(Shingrix) 05/04/2021, 06/21/2021   Zoster, Live 01/07/2013        Objective:     BP 116/60 (BP Location: Right Arm, Cuff Size: Normal)   Pulse 63   Temp (!) 96.8 F (36 C)   Ht 5\' 3"  (1.6 m)   Wt 175 lb (79.4 kg)   SpO2 92%   BMI 31.00 kg/m   SpO2: 92 % O2 Device: None (Room air)  GENERAL: Obese woman, no acute distress, presents in transport  chair today, no conversational dyspnea. HEAD: Normocephalic, atraumatic.  EYES: Pupils equal, round, reactive to light.  No scleral icterus.  MOUTH: Pharynx clear, oral mucosa moist. NECK: Supple. No thyromegaly. Trachea midline. No JVD.  No adenopathy. PULMONARY: Good air entry bilaterally.  No adventitious sounds. CARDIOVASCULAR: S1 and S2.  Bradycardic regular rate at 56 bpm, no cardiac murmurs discerned. ABDOMEN: Obese, otherwise benign. MUSCULOSKELETAL: No joint deformity, no clubbing, no edema.  NEUROLOGIC: No overt focal deficit, no gait disturbance, speech is fluent. SKIN: Intact,warm,dry. PSYCH: Mood and behavior normal.           Assessment & Plan:     ICD-10-CM   1. Stage 1 mild COPD by GOLD classification (HCC)  J44.9     2. Abnormal CT of the chest  R93.89     3. Paroxysmal atrial fibrillation (HCC)  I48.0     4. Sleep-disordered breathing  G47.30      Discussion:    Chronic Obstructive Pulmonary Disease (COPD) COPD with exertional dyspnea. No cough. Symptoms well-managed with Trelegy. Lungs clear on auscultation. She is not smoking or vaping, using a pencil to mimic the habit. - Continue Trelegy - Order repeat scan in December for lung nodule  Compression Fracture of L5 Vertebra Compression fracture of L5, likely exacerbated by a recent fall. Increased pain reported post-fall two weeks ago. MRI pending; prior CT showed lumbar fractures. Receiving back injections for pain. Reluctant towards surgery unless necessary. Surgery may be considered if spinal cord impingement is noted. - Await MRI results - Discuss potential need for surgery with spine specialist if spinal cord impingement is noted - Consider bone-building medications due to recurrent compression fractures  Arthritis of the Spine Arthritis of the spine contributing to chronic back pain. Receiving injections for pain management. - Continue current pain management regimen - Discuss further management  options with spine specialist  General Health Maintenance Discussed importance of avoiding smoking and vaping. - Continue to avoid smoking and vaping  Follow-up - Call with sleep study results - Schedule follow-up appointment in four months.       Advised if symptoms do not improve or worsen, to please contact office for sooner follow up or seek emergency care.    I spent 31 minutes of dedicated to the care of this patient on the date of this encounter to include pre-visit review of records, face-to-face time with the patient discussing conditions above, post visit ordering of  testing, clinical documentation with the electronic health record, making appropriate referrals as documented, and communicating necessary findings to members of the patients care team.     C. Danice Goltz, MD Advanced Bronchoscopy PCCM [facility]    *This note was generated using voice recognition software/Dragon and/or AI transcription program.  Despite best efforts to proofread, errors can occur which can change the meaning. Any transcriptional errors that result from this process are unintentional and may not be fully corrected at the time of dictation.

## 2023-05-10 NOTE — Patient Instructions (Signed)
 VISIT SUMMARY:  Today, we discussed your ongoing management of COPD, your recent back injury, and your chronic arthritis. Your COPD symptoms are well-managed with Trelegy, and you are successfully avoiding smoking and vaping. We also reviewed your recent fall and the resulting compression fracture in your spine, as well as your chronic arthritis pain management.  YOUR PLAN:  -CHRONIC OBSTRUCTIVE PULMONARY DISEASE (COPD): COPD is a chronic lung condition that makes it hard to breathe. Your symptoms are well-managed with Trelegy, and you are not smoking or vaping, which is excellent. We will continue with your current medication and plan a repeat scan in December to monitor the lung nodule.  -COMPRESSION FRACTURE OF L5 VERTEBRA: A compression fracture is a small break in a bone in your spine. This was likely worsened by your recent fall. We are waiting for the MRI results to decide if surgery is needed. Meanwhile, you are receiving injections for pain, and we may consider bone-building medications to prevent further fractures.  -ARTHRITIS OF THE SPINE: Arthritis in the spine causes chronic back pain. You are currently receiving injections for pain management. We will continue with this regimen and discuss further options with a spine specialist.  -GENERAL HEALTH MAINTENANCE: It is important to continue avoiding smoking and vaping to maintain your overall health.  INSTRUCTIONS:  We will call you with the results of your sleep study. Schedule a follow-up appointment in four months.

## 2023-05-12 ENCOUNTER — Telehealth: Payer: Self-pay | Admitting: Pulmonary Disease

## 2023-05-12 DIAGNOSIS — G4733 Obstructive sleep apnea (adult) (pediatric): Secondary | ICD-10-CM

## 2023-05-12 NOTE — Telephone Encounter (Signed)
 Call patient  Sleep study result  Date of study: 04/27/2023  Impression: Mild obstructive sleep apnea with AHI of 2.7, moderate oxygen desaturations with O2 nadir of 82% Saturations below 88% for 93% of the study  Recommendation:  Will recommend CPAP therapy  Options of treatment for mild obstructive sleep apnea will include  1.  CPAP therapy if there is significant daytime sleepiness or other comorbidities including history of CVA or cardiac disease -If CPAP is chosen as an option of treatment auto titrating CPAP with a pressure setting of 5-15 will be appropriate  2.  Watchful waiting with emphasis on weight loss measures, sleep position modification to optimize lateral sleep, elevating the head of the bed by about 30 degrees may also help.  3.  An oral device may be fashioned for the treatment of mild sleep disordered breathing, will involve referral to dentist.  4.  The significant desaturations will make CPAP more appropriate as the option of treatment, if CPAP will not be tolerated, oxygen supplementation with other options of treatment may be considered   Follow-up as previously scheduled

## 2023-05-14 NOTE — Addendum Note (Signed)
 Addended by: Hyacinth Meeker on: 05/14/2023 04:47 PM   Modules accepted: Orders

## 2023-05-14 NOTE — Telephone Encounter (Signed)
 She has mild sleep apnea but her oxygen level decreases significantly.  The recommendation is to give a trial of CPAP.  Can try auto CPAP of 5 to 12 cm H2O, with heated humidity and mask of choice.  If she does go with CPAP will need overnight oximetry after 2 weeks of use.  If she declines CPAP then she will definitely need oxygen at 2 L/min.

## 2023-05-14 NOTE — Telephone Encounter (Signed)
 Patient advised. Agreed to starting CPAP. DME order placed for CPAP and ONO. Nothing further needed.

## 2023-05-16 ENCOUNTER — Encounter: Payer: Self-pay | Admitting: Pulmonary Disease

## 2023-05-22 DIAGNOSIS — M47816 Spondylosis without myelopathy or radiculopathy, lumbar region: Secondary | ICD-10-CM | POA: Diagnosis not present

## 2023-05-24 ENCOUNTER — Telehealth: Payer: Self-pay | Admitting: Pulmonary Disease

## 2023-05-24 NOTE — Telephone Encounter (Signed)
 Okay to refill?

## 2023-05-24 NOTE — Telephone Encounter (Signed)
 Patient states needs refill for Albuterol inhaler. Pharmacy is [facility]. Patient phone number is [phone].

## 2023-05-25 MED ORDER — ALBUTEROL SULFATE HFA 108 (90 BASE) MCG/ACT IN AERS: 2.0000 | INHALATION_SPRAY | Freq: Four times a day (QID) | RESPIRATORY_TRACT | 11 refills | Status: DC | PRN

## 2023-05-25 NOTE — Telephone Encounter (Signed)
 [place] to refill

## 2023-05-25 NOTE — Telephone Encounter (Signed)
 I have sent in the refill and notified the patient.  Nothing further needed.

## 2023-05-29 ENCOUNTER — Other Ambulatory Visit: Payer: Self-pay | Admitting: Family Medicine

## 2023-05-29 NOTE — Telephone Encounter (Signed)
 From last refill (we want to minimize sedating medicines that increase fall risk pt said):  Pt notified of Dr. Royden Purl comments. Pt said she fell on top of her chronic pain/ problems and she is already seeing a specialist who ordered a MRI of her back that she is getting tomorrow ( I can see it pending in epic). Pt said once they get the results of her MRI her back specialist plan on taking over care so she thinks this will be the last refill PCP has to do   FYI to PCP

## 2023-05-29 NOTE — Telephone Encounter (Signed)
 Last OV was an acute appt for UTI on 04/03/23, last filled on 05/01/23 #30 tabs/ 0 refill

## 2023-05-30 NOTE — Telephone Encounter (Signed)
 Pt said this is an auto refill she has plenty of med and is seeing the back specialist. Pt said when she needs a refill of the flexeril she will get the specialist to refill it. Med declined and pt aware.  FYI to PCP

## 2023-06-05 DIAGNOSIS — F411 Generalized anxiety disorder: Secondary | ICD-10-CM | POA: Diagnosis not present

## 2023-06-05 DIAGNOSIS — F5101 Primary insomnia: Secondary | ICD-10-CM | POA: Diagnosis not present

## 2023-06-05 DIAGNOSIS — F332 Major depressive disorder, recurrent severe without psychotic features: Secondary | ICD-10-CM | POA: Diagnosis not present

## 2023-06-11 DIAGNOSIS — M5416 Radiculopathy, lumbar region: Secondary | ICD-10-CM | POA: Diagnosis not present

## 2023-06-20 DIAGNOSIS — F5101 Primary insomnia: Secondary | ICD-10-CM | POA: Diagnosis not present

## 2023-06-20 DIAGNOSIS — F411 Generalized anxiety disorder: Secondary | ICD-10-CM | POA: Diagnosis not present

## 2023-06-20 DIAGNOSIS — F332 Major depressive disorder, recurrent severe without psychotic features: Secondary | ICD-10-CM | POA: Diagnosis not present

## 2023-07-03 DIAGNOSIS — F332 Major depressive disorder, recurrent severe without psychotic features: Secondary | ICD-10-CM | POA: Diagnosis not present

## 2023-07-03 DIAGNOSIS — F03A3 Unspecified dementia, mild, with mood disturbance: Secondary | ICD-10-CM | POA: Diagnosis not present

## 2023-07-03 DIAGNOSIS — F5101 Primary insomnia: Secondary | ICD-10-CM | POA: Diagnosis not present

## 2023-07-03 DIAGNOSIS — F411 Generalized anxiety disorder: Secondary | ICD-10-CM | POA: Diagnosis not present

## 2023-07-10 DIAGNOSIS — M47816 Spondylosis without myelopathy or radiculopathy, lumbar region: Secondary | ICD-10-CM | POA: Diagnosis not present

## 2023-07-10 DIAGNOSIS — M5416 Radiculopathy, lumbar region: Secondary | ICD-10-CM | POA: Diagnosis not present

## 2023-07-13 ENCOUNTER — Other Ambulatory Visit: Payer: Self-pay | Admitting: Family Medicine

## 2023-07-17 DIAGNOSIS — G4730 Sleep apnea, unspecified: Secondary | ICD-10-CM | POA: Diagnosis not present

## 2023-07-17 DIAGNOSIS — R0902 Hypoxemia: Secondary | ICD-10-CM | POA: Diagnosis not present

## 2023-07-20 ENCOUNTER — Telehealth: Payer: Self-pay

## 2023-07-20 ENCOUNTER — Encounter (HOSPITAL_COMMUNITY): Payer: Self-pay

## 2023-07-20 DIAGNOSIS — G4730 Sleep apnea, unspecified: Secondary | ICD-10-CM

## 2023-07-20 DIAGNOSIS — J449 Chronic obstructive pulmonary disease, unspecified: Secondary | ICD-10-CM

## 2023-07-20 DIAGNOSIS — G4733 Obstructive sleep apnea (adult) (pediatric): Secondary | ICD-10-CM

## 2023-07-20 NOTE — Telephone Encounter (Signed)
 Overnight pulse oximetry report-Reviewed by Dr. Viva Grise.  Test started on 07/17/2023 ended on 07/18/2023.  Dr. Viva Grise recommends 2L of oxygen bled into CPAP.  LMTCB.

## 2023-07-22 ENCOUNTER — Telehealth: Payer: Self-pay | Admitting: Family Medicine

## 2023-07-22 DIAGNOSIS — I10 Essential (primary) hypertension: Secondary | ICD-10-CM

## 2023-07-22 DIAGNOSIS — R7303 Prediabetes: Secondary | ICD-10-CM

## 2023-07-22 DIAGNOSIS — E782 Mixed hyperlipidemia: Secondary | ICD-10-CM

## 2023-07-22 DIAGNOSIS — D696 Thrombocytopenia, unspecified: Secondary | ICD-10-CM

## 2023-07-22 NOTE — Telephone Encounter (Signed)
-----   Message from Gerry Krone sent at 07/03/2023 11:04 AM EDT ----- Regarding: Lab orders for Tue, 5.13.25 Patient is scheduled for CPX labs, please order future labs, Thanks , Anselmo Kings

## 2023-07-23 NOTE — Telephone Encounter (Signed)
 Patient advised. NFN.

## 2023-07-23 NOTE — Telephone Encounter (Signed)
 Patient advised of ONO results. Patient agreed to starting oxygen. DME order placed.

## 2023-07-23 NOTE — Telephone Encounter (Signed)
 Patient advised of ONO result. Agreed to oxygen. Patient wants to know if her breathing is getting worse.

## 2023-07-23 NOTE — Telephone Encounter (Signed)
 This does not mean that her breathing is getting worse it just means that the CPAP alone does not correct her oxygen level going down with the sleep apnea.  She just needs to have supplemental oxygen added to her CPAP.

## 2023-07-24 ENCOUNTER — Other Ambulatory Visit (INDEPENDENT_AMBULATORY_CARE_PROVIDER_SITE_OTHER)

## 2023-07-24 ENCOUNTER — Ambulatory Visit: Payer: Self-pay | Admitting: Family Medicine

## 2023-07-24 DIAGNOSIS — R7303 Prediabetes: Secondary | ICD-10-CM | POA: Diagnosis not present

## 2023-07-24 DIAGNOSIS — I10 Essential (primary) hypertension: Secondary | ICD-10-CM

## 2023-07-24 DIAGNOSIS — D696 Thrombocytopenia, unspecified: Secondary | ICD-10-CM

## 2023-07-24 DIAGNOSIS — E782 Mixed hyperlipidemia: Secondary | ICD-10-CM | POA: Diagnosis not present

## 2023-07-24 LAB — CBC WITH DIFFERENTIAL/PLATELET
Basophils Absolute: 0.1 10*3/uL (ref 0.0–0.1)
Basophils Relative: 0.5 % (ref 0.0–3.0)
Eosinophils Absolute: 0.1 10*3/uL (ref 0.0–0.7)
Eosinophils Relative: 0.6 % (ref 0.0–5.0)
HCT: 45.3 % (ref 36.0–46.0)
Hemoglobin: 15 g/dL (ref 12.0–15.0)
Lymphocytes Relative: 16.6 % (ref 12.0–46.0)
Lymphs Abs: 1.8 10*3/uL (ref 0.7–4.0)
MCHC: 33.1 g/dL (ref 30.0–36.0)
MCV: 90.6 fl (ref 78.0–100.0)
Monocytes Absolute: 2.7 10*3/uL — ABNORMAL HIGH (ref 0.1–1.0)
Monocytes Relative: 25.2 % — ABNORMAL HIGH (ref 3.0–12.0)
Neutro Abs: 6.2 10*3/uL (ref 1.4–7.7)
Neutrophils Relative %: 57.1 % (ref 43.0–77.0)
Platelets: 124 10*3/uL — ABNORMAL LOW (ref 150.0–400.0)
RBC: 5 Mil/uL (ref 3.87–5.11)
RDW: 13.5 % (ref 11.5–15.5)
WBC: 10.8 10*3/uL — ABNORMAL HIGH (ref 4.0–10.5)

## 2023-07-24 LAB — COMPREHENSIVE METABOLIC PANEL WITH GFR
ALT: 14 U/L (ref 0–35)
AST: 20 U/L (ref 0–37)
Albumin: 3.7 g/dL (ref 3.5–5.2)
Alkaline Phosphatase: 116 U/L (ref 39–117)
BUN: 6 mg/dL (ref 6–23)
CO2: 31 meq/L (ref 19–32)
Calcium: 9.1 mg/dL (ref 8.4–10.5)
Chloride: 102 meq/L (ref 96–112)
Creatinine, Ser: 0.55 mg/dL (ref 0.40–1.20)
GFR: 90.32 mL/min (ref >60.00)
Glucose, Bld: 108 mg/dL — ABNORMAL HIGH (ref 70–99)
Potassium: 3 meq/L — ABNORMAL LOW (ref 3.5–5.1)
Sodium: 145 meq/L (ref 135–145)
Total Bilirubin: 0.5 mg/dL (ref 0.2–1.2)
Total Protein: 6.2 g/dL (ref 6.0–8.3)

## 2023-07-24 LAB — LIPID PANEL
Cholesterol: 122 mg/dL (ref 0–200)
HDL: 38.6 mg/dL — ABNORMAL LOW (ref >39.00)
LDL Cholesterol: 53 mg/dL (ref 0–99)
NonHDL: 83.01
Total CHOL/HDL Ratio: 3
Triglycerides: 151 mg/dL — ABNORMAL HIGH (ref 0.0–149.0)
VLDL: 30.2 mg/dL (ref 0.0–40.0)

## 2023-07-24 LAB — TSH: TSH: 1.48 u[IU]/mL (ref 0.35–5.50)

## 2023-07-24 LAB — HEMOGLOBIN A1C: Hgb A1c MFr Bld: 5.7 % (ref 4.6–6.5)

## 2023-07-25 ENCOUNTER — Telehealth: Payer: Self-pay | Admitting: Pulmonary Disease

## 2023-07-25 MED ORDER — POTASSIUM CHLORIDE CRYS ER 20 MEQ PO TBCR
20.0000 meq | EXTENDED_RELEASE_TABLET | Freq: Every day | ORAL | 0 refills | Status: DC
Start: 2023-07-25 — End: 2023-07-31

## 2023-07-25 NOTE — Telephone Encounter (Signed)
 I have sent this to [employer] waiting to see if this will work

## 2023-07-25 NOTE — Telephone Encounter (Signed)
 She unfortunately cannot do an in lab study due to stroke, anxiety issues.

## 2023-07-25 NOTE — Telephone Encounter (Signed)
 Dr. Viva Grise order 02 to be bled into cpap. I received a response from [place] with [facility]   This patient has OSA. We would need a titrated sleep study in order to process.

## 2023-07-27 ENCOUNTER — Ambulatory Visit

## 2023-07-27 VITALS — Ht 63.0 in | Wt 175.0 lb

## 2023-07-27 DIAGNOSIS — Z7189 Other specified counseling: Secondary | ICD-10-CM | POA: Diagnosis not present

## 2023-07-27 DIAGNOSIS — Z0000 Encounter for general adult medical examination without abnormal findings: Secondary | ICD-10-CM | POA: Diagnosis not present

## 2023-07-27 NOTE — Patient Instructions (Addendum)
 Ms. Dieterle , Thank you for taking time out of your busy schedule to complete your Annual Wellness Visit with me. I enjoyed our conversation and look forward to speaking with you again next year. I, as well as your care team,  appreciate your ongoing commitment to your health goals. Please review the following plan we discussed and let me know if I can assist you in the future. Your Game plan/ To Do List    Referrals: If you haven't heard from the office you've been referred to, please reach out to them at the phone provided.   A referral has been placed for you to check and see what additional resources are available to you.   If you haven't heard from anyone within the next 7 business days, please call them and let them know a referral has been placed for you Phone: [phone]   Follow up Visits: Next Medicare AWV with our clinical staff: 07/30/24 @ 2:20pm   Have you seen your provider in the last 6 months (3 months if uncontrolled diabetes)? No Next Office Visit with your provider: 07/31/23  Clinician Recommendations:  Aim for 30 minutes of exercise or brisk walking, 6-8 glasses of water, and 5 servings of fruits and vegetables each day.       This is a list of the screening recommended for you and due dates:  Health Maintenance  Topic Date Due   Colon Cancer Screening  11/11/2020   DTaP/Tdap/Td vaccine (3 - Td or Tdap) 01/22/2022   COVID-19 Vaccine (3 - Pfizer risk series) 12/20/2023*   Mammogram  08/30/2023   Flu Shot  10/12/2023   Medicare Annual Wellness Visit  07/26/2024   Pneumonia Vaccine  Completed   DEXA scan (bone density measurement)  Completed   Hepatitis C Screening  Completed   Zoster (Shingles) Vaccine  Completed   HPV Vaccine  Aged Out   Meningitis B Vaccine  Aged Out   Screening for Lung Cancer  Discontinued  *Topic was postponed. The date shown is not the original due date.    Advanced directives: (Copy Requested) Please bring a copy of your health care  power of attorney and living will to the office to be added to your chart at your convenience. You can mail to [facility] [address] [place], [place] [address] or email to [email or web address] Advance Care Planning is important because it:  [x]  Makes sure you receive the medical care that is consistent with your values, goals, and preferences  [x]  It provides guidance to your family and loved ones and reduces their decisional burden about whether or not they are making the right decisions based on your wishes.  Follow the link provided in your after visit summary or read over the paperwork we have mailed to you to help you started getting your Advance Directives in place. If you need assistance in completing these, please reach out to us  so that we can help you!

## 2023-07-27 NOTE — Progress Notes (Signed)
 Subjective:   Anita Harrell is a 73 y.o. who presents for a Medicare Wellness preventive visit.  As a reminder, Annual Wellness Visits don't include a physical exam, and some assessments may be limited, especially if this visit is performed virtually. We may recommend an in-person follow-up visit with your provider if needed.  Visit Complete: Virtual I connected with  Anita Harrell on 07/27/23 by a audio enabled telemedicine application and verified that I am speaking with the correct person using two identifiers.  Patient Location: Home  Provider Location: Office/Clinic  I discussed the limitations of evaluation and management by telemedicine. The patient expressed understanding and agreed to proceed.  Vital Signs: Because this visit was a virtual/telehealth visit, some criteria may be missing or patient reported. Any vitals not documented were not able to be obtained and vitals that have been documented are patient reported.  VideoDeclined- This patient declined Librarian, academic. Therefore the visit was completed with audio only.  Persons Participating in Visit: Patient.  AWV Questionnaire: No: Patient Medicare AWV questionnaire was not completed prior to this visit.  Cardiac Risk Factors include: advanced age (>43men, >57 women);dyslipidemia;hypertension;obesity (BMI >30kg/m2);sedentary lifestyle     Objective:     Today's Vitals   07/27/23 1423 07/27/23 1424  Weight: 175 lb (79.4 kg)   Height: 5\' 3"  (1.6 m)   PainSc:  5    Body mass index is 31 kg/m.     07/27/2023    2:54 PM 11/18/2022    1:31 PM 11/18/2022    7:41 AM 05/18/2022    1:26 PM 02/14/2022    2:07 AM 02/13/2022    5:43 AM 02/09/2022    7:06 AM  Advanced Directives  Does Patient Have a Medical Advance Directive? Yes Yes Yes Yes No;Yes No No  Type of Estate agent of [place];Living will Living will;Healthcare Power of [employer] Power of  Universal;Living will Healthcare Power of [place];Living will Living will    Does patient want to make changes to medical advance directive?  No - Patient declined   No - Patient declined    Copy of Healthcare Power of Attorney in Chart? No - copy requested No - copy requested  No - copy requested     Would patient like information on creating a medical advance directive?     No - Patient declined  Yes (ED - Information included in AVS)    Current Medications (verified) Outpatient Encounter Medications as of 07/27/2023  Medication Sig   acetaminophen  (TYLENOL ) 500 MG tablet Take 500 mg by mouth every 6 (six) hours as needed.   albuterol  (VENTOLIN  HFA) 108 (90 Base) MCG/ACT inhaler Inhale 2 puffs into the lungs every 6 (six) hours as needed for wheezing or shortness of breath.   ALPRAZolam  (XANAX ) 0.5 MG tablet Take 0.5 mg by mouth 2 (two) times daily as needed.   cyclobenzaprine  (FLEXERIL ) 10 MG tablet TAKE ONE HALF (1/2) TO ONE TABLET BY MOUTH AT BEDTIME AS NEEDED FOR MUSCLE SPASMS   DULoxetine  (CYMBALTA ) 60 MG capsule Take 60 mg by mouth daily.   ELIQUIS  5 MG TABS tablet TAKE ONE TABLET BY MOUTH TWICE A DAY   flecainide  (TAMBOCOR ) 100 MG tablet Take 1 tablet (100 mg total) by mouth 2 (two) times daily.   gabapentin (NEURONTIN) 300 MG capsule Take 600 mg by mouth at bedtime.   HYDROcodone -acetaminophen  (NORCO/VICODIN) 5-325 MG tablet Take 1 tablet by mouth 3 (three) times daily as needed.  hydrOXYzine (VISTARIL) 25 MG capsule Take 25 mg by mouth 2 (two) times daily.   Melatonin 5 MG CAPS Take by mouth.   metoprolol  succinate (TOPROL -XL) 50 MG 24 hr tablet TAKE ONE TABLET BY MOUTH TWICE A DAY WITH MEALS   mirabegron  ER (MYRBETRIQ ) 25 MG TB24 tablet Take 1 tablet (25 mg total) by mouth daily.   mirtazapine (REMERON) 7.5 MG tablet Take 7.5 mg by mouth at bedtime.   PARoxetine  (PAXIL ) 10 MG tablet Take 1 tablet by mouth daily.   potassium chloride  SA (KLOR-CON  M) 20 MEQ tablet Take 1 tablet (20  mEq total) by mouth daily.   rosuvastatin  (CRESTOR ) 10 MG tablet TAKE ONE TABLET BY MOUTH ONCE A DAY   spironolactone  (ALDACTONE ) 25 MG tablet Take 1 tablet (25 mg total) by mouth daily.   traMADol  (ULTRAM ) 50 MG tablet TAKE ONE (1) TO TWO (2) TABLETS BY MOUTH EVERY EIGHT HOURS AS NEEDED FOR MODERATE PAIN   traZODone  (DESYREL ) 100 MG tablet Take 100 mg by mouth at bedtime as needed.   TRELEGY ELLIPTA  100-62.5-25 MCG/ACT AEPB INHALE ONE PUFF INTO THE LUNGS DAILY   furosemide  (LASIX ) 20 MG tablet Take 1 tablet (20 mg total) by mouth daily.   No facility-administered encounter medications on file as of 07/27/2023.    Allergies (verified) Abilify [aripiprazole], Ambien  [zolpidem ], Codeine, Cymbalta  [duloxetine  hcl], Lipitor [atorvastatin], Vesicare  [solifenacin ], and Wellbutrin  [bupropion ]   History: Past Medical History:  Diagnosis Date   Allergy    allergic rhinitis   Anxiety    Arrhythmia    atrial fibrillation   Back pain    Chest pain    CHF (congestive heart failure) (HCC)    COPD (chronic obstructive pulmonary disease) (HCC)    Depression    Epigastric pain    Goiter    History of tobacco abuse    Hyperglycemia    mild   Hyperlipidemia    Hypertension    Insomnia    Hx of   Labile blood pressure    Panic disorder    History of   Personal history of colonic polyps 06/09/2004   SVD (spontaneous vaginal delivery)    x 2   Past Surgical History:  Procedure Laterality Date   ABDOMINAL HYSTERECTOMY N/A 09/24/2012   Procedure: HYSTERECTOMY ABDOMINAL;  Surgeon: Deann Exon, MD;  Location: [facility];  Service: Gynecology;  Laterality: N/A;   BREAST BIOPSY Right 2016   CARDIOVERSION N/A 02/09/2022   Procedure: CARDIOVERSION;  Surgeon: Devorah Fonder, MD;  Location: [facility];  Service: Cardiovascular;  Laterality: N/A;   CARDIOVERSION N/A 03/02/2022   Procedure: CARDIOVERSION;  Surgeon: Devorah Fonder, MD;  Location: [facility];  Service: Cardiovascular;  Laterality: N/A;    COLONOSCOPY     COLONOSCOPY  2017   ELBOW SURGERY  2004   EYE SURGERY     bilateral lasik   SALPINGOOPHORECTOMY Bilateral 09/24/2012   Procedure: SALPINGO OOPHORECTOMY;  Surgeon: Deann Exon, MD;  Location: [facility];  Service: Gynecology;  Laterality: Bilateral;   THYROID  SURGERY     B9 massess   WART FULGURATION N/A 09/24/2012   Procedure: FULGURATION VAGINAL WART;  Surgeon: Deann Exon, MD;  Location: [facility];  Service: Gynecology;  Laterality: N/A;   WISDOM TOOTH EXTRACTION     Family History  Problem Relation Age of Onset   Hypertension Mother    Stroke Mother    Alcohol abuse Father    Cancer Father        lung CA  Heart disease Father        CHF   Cancer Sister        breast CA   Heart disease Sister        CHF from chemotx also has defib   Breast cancer Sister    Colon cancer Neg Hx    Esophageal cancer Neg Hx    Rectal cancer Neg Hx    Stomach cancer Neg Hx    Social History   Socioeconomic History   Marital status: Married    Spouse name: Not on file   Number of children: Not on file   Years of education: Not on file   Highest education level: Not on file  Occupational History   Not on file  Tobacco Use   Smoking status: Former    Current packs/day: 0.00    Types: E-cigarettes, Cigarettes    Quit date: 2011    Years since quitting: 14.3   Smokeless tobacco: Never  Vaping Use   Vaping status: Former   Quit date: 11/18/2022  Substance and Sexual Activity   Alcohol use: No    Alcohol/week: 0.0 standard drinks of alcohol   Drug use: No   Sexual activity: Yes    Birth control/protection: Post-menopausal  Other Topics Concern   Not on file  Social History Narrative   Lives with husband and 2 pets; dogs.   Social Drivers of Corporate investment banker Strain: Low Risk  (05/18/2022)   Overall Financial Resource Strain (CARDIA)    Difficulty of Paying Living Expenses: Not hard at all  Food Insecurity: No Food Insecurity (07/27/2023)   Hunger Vital Sign     Worried About Running Out of Food in the Last Year: Never true    Ran Out of Food in the Last Year: Never true  Transportation Needs: No Transportation Needs (07/27/2023)   PRAPARE - Administrator, Civil Service (Medical): No    Lack of Transportation (Non-Medical): No  Physical Activity: Inactive (07/27/2023)   Exercise Vital Sign    Days of Exercise per Week: 0 days    Minutes of Exercise per Session: 0 min  Stress: No Stress Concern Present (07/27/2023)   [employer] of Occupational Health - Occupational Stress Questionnaire    Feeling of Stress : Only a little  Social Connections: Socially Isolated (07/27/2023)   Social Connection and Isolation Panel [NHANES]    Frequency of Communication with Friends and Family: Twice a week    Frequency of Social Gatherings with Friends and Family: Never    Attends Religious Services: Never    Diplomatic Services operational officer: No    Attends Engineer, structural: Never    Marital Status: Married    Tobacco Counseling Counseling given: Not Answered    Clinical Intake:  Pre-visit preparation completed: Yes  Pain : 0-10 Pain Score: 5  Pain Type: Chronic pain Pain Location: Back Pain Orientation: Lower Pain Descriptors / Indicators: Aching, Shooting, Pounding, Stabbing Pain Onset: More than a month ago Pain Frequency: Intermittent Pain Relieving Factors: medications, injections, spine dr, rest Effect of Pain on Daily Activities: limits activities and managing home  Pain Relieving Factors: medications, injections, spine dr, rest  BMI - recorded: 31 Nutritional Status: BMI > 30  Obese Nutritional Risks: None Diabetes: No  Lab Results  Component Value Date   HGBA1C 5.7 07/24/2023   HGBA1C 6.1 06/07/2021   HGBA1C 6.1 05/14/2020     How often do you need  to have someone help you when you read instructions, pamphlets, or other written materials from your doctor or pharmacy?: 1 - Never  Interpreter  Needed?: No  Comments: lives with husband Information entered by :: B.Virgal Warmuth,LPN   Activities of Daily Living     07/27/2023    2:56 PM 11/18/2022    1:31 PM  In your present state of health, do you have any difficulty performing the following activities:  Hearing? 0 0  Vision? 0 0  Difficulty concentrating or making decisions? 1 0  Walking or climbing stairs? 1 0  Dressing or bathing? 0 1  Doing errands, shopping? 1 0  Preparing Food and eating ? N   Using the Toilet? N   In the past six months, have you accidently leaked urine? N   Do you have problems with loss of bowel control? N   Managing your Medications? N   Managing your Finances? N   Housekeeping or managing your Housekeeping? N     Patient Care Team: Tower, Manley Seeds, MD as PCP - General (Family Medicine) Jerelene Monday, Deadra Everts, MD as PCP - Cardiology (Cardiology) Bonita Bussing, MD as Consulting Physician (Obstetrics and Gynecology) Marc Senior, MD as Consulting Physician (Pulmonary Disease) Doles-Johnson, Teah, NP as Nurse Practitioner (Psychiatry) Jonathan Neighbor, [facility] (Inactive) as Pharmacist (Pharmacist)  Indicate any recent Medical Services you may have received from other than [facility] providers in the past year (date may be approximate).     Assessment:    This is a routine wellness examination for Missey.  Hearing/Vision screen Hearing Screening - Comments:: Pt says her hearing is good Vision Screening - Comments:: Pt says her vision is good w/glasses Dr Particia Bolus   Goals Addressed             This Visit's Progress    Patient Stated   On track    07/27/23- I will maintain and continue medications as prescribed.     Patient Stated   Not on track    07/27/23-Trying to get off some medications.       Depression Screen     07/27/2023    2:36 PM 04/03/2023    2:12 PM 03/12/2023    3:18 PM 12/04/2022    2:02 PM 05/18/2022    1:25 PM 05/17/2022   11:52 AM 05/16/2021    1:19 PM  PHQ 2/9 Scores   PHQ - 2 Score 6 4 3 2  0 4 1  PHQ- 9 Score 17 11 8 8  0 19     Fall Risk     07/27/2023    2:28 PM 05/26/2022   11:18 AM 05/18/2022    1:27 PM 10/26/2021   10:51 AM 10/05/2021    9:21 AM  Fall Risk   Falls in the past year? 1 1 1  0 0  Number falls in past yr: 1 1 1     Comment   mainly at night due to ambien  per pt    Injury with Fall? 0 0 0    Risk for fall due to : Impaired balance/gait;Impaired mobility;Orthopedic patient History of fall(s) Impaired balance/gait    Risk for fall due to: Comment   per patient, has not fallen since stopping ambien .    Follow up Education provided;Falls prevention discussed Falls evaluation completed Falls evaluation completed;Falls prevention discussed;Education provided Falls evaluation completed Falls evaluation completed    MEDICARE RISK AT HOME:  Medicare Risk at Home Any stairs in or around the home?: Yes If so,  are there any without handrails?: Yes Home free of loose throw rugs in walkways, pet beds, electrical cords, etc?: Yes Adequate lighting in your home to reduce risk of falls?: Yes Life alert?: No Use of a cane, walker or w/c?: Yes Grab bars in the bathroom?: Yes Shower chair or bench in shower?: Yes Elevated toilet seat or a handicapped toilet?: No  TIMED UP AND GO:  Was the test performed?  No  Cognitive Function: 6CIT completed    05/14/2020    1:22 PM 12/15/2016   10:40 AM 08/30/2015    1:45 PM  MMSE - Mini Mental State Exam  Orientation to time 5 5 5   Orientation to Place 5 5 5   Registration 3 3 3   Attention/ Calculation 5 0 0  Recall 3 3 3   Language- name 2 objects  0 0  Language- repeat 1 1 1   Language- follow 3 step command  3 3  Language- read & follow direction  0 0  Write a sentence  0 0  Copy design  0 0  Total score  20 20        07/27/2023    2:56 PM 05/18/2022    1:31 PM  6CIT Screen  What Year? 0 points 0 points  What month? 0 points 0 points  What time? 0 points 0 points  Count back from 20 0 points 0  points  Months in reverse 4 points 0 points  Repeat phrase 8 points 4 points  Total Score 12 points 4 points    Immunizations Immunization History  Administered Date(s) Administered   Fluad Quad(high Dose 65+) 01/16/2019, 11/29/2021   Fluad Trivalent(High Dose 65+) 02/06/2023   Influenza Split 03/27/2011, 01/23/2012   Influenza,inj,Quad PF,6+ Mos 11/26/2012, 11/10/2013, 11/24/2016, 12/25/2017   PFIZER(Purple Top)SARS-COV-2 Vaccination 10/20/2019, 11/10/2019   Pneumococcal Conjugate-13 08/21/2014   Pneumococcal Polysaccharide-23 05/19/2008, 08/30/2015   Td 03/13/2001   Tdap 01/23/2012   Zoster Recombinant(Shingrix) 05/04/2021, 06/21/2021   Zoster, Live 01/07/2013    Screening Tests Health Maintenance  Topic Date Due   Colonoscopy  11/11/2020   DTaP/Tdap/Td (3 - Td or Tdap) 01/22/2022   COVID-19 Vaccine (3 - Pfizer risk series) 12/20/2023 (Originally 12/08/2019)   MAMMOGRAM  08/30/2023   INFLUENZA VACCINE  10/12/2023   Medicare Annual Wellness (AWV)  07/26/2024   Pneumonia Vaccine 74+ Years old  Completed   DEXA SCAN  Completed   Hepatitis C Screening  Completed   Zoster Vaccines- Shingrix  Completed   HPV VACCINES  Aged Out   Meningococcal B Vaccine  Aged Out   Lung Cancer Screening  Discontinued    Health Maintenance  Health Maintenance Due  Topic Date Due   Colonoscopy  11/11/2020   DTaP/Tdap/Td (3 - Td or Tdap) 01/22/2022   Health Maintenance Items Addressed: Pt does not desire to do the colonoscopy. She will speak with PCP regarding this.  Additional Screening:  Vision Screening: Recommended annual ophthalmology exams for early detection of glaucoma and other disorders of the eye.  Dental Screening: Recommended annual dental exams for proper oral hygiene  Community Resource Referral / Chronic Care Management: CRR required this visit?  No   CCM required this visit?  Appt scheduled with PCP   Plan:    I have personally reviewed and noted the following  in the patient's chart:   Medical and social history Use of alcohol, tobacco or illicit drugs  Current medications and supplements including opioid prescriptions. Patient is currently taking opioid prescriptions. Information provided  to patient regarding non-opioid alternatives. Patient advised to discuss non-opioid treatment plan with their provider. Functional ability and status Nutritional status Physical activity Advanced directives List of other physicians Hospitalizations, surgeries, and ER visits in previous 12 months Vitals Screenings to include cognitive, depression, and falls Referrals and appointments  In addition, I have reviewed and discussed with patient certain preventive protocols, quality metrics, and best practice recommendations. A written personalized care plan for preventive services as well as general preventive health recommendations were provided to patient.   Nerissa Bannister, LPN   04/12/8655   After Visit Summary: (MyChart) Due to this being a telephonic visit, the after visit summary with patients personalized plan was offered to patient via MyChart   Notes: Please refer to Routing Comments.

## 2023-07-27 NOTE — Telephone Encounter (Signed)
 Devra Fontana was sending it to [place] waiting to see

## 2023-07-27 NOTE — Telephone Encounter (Signed)
 Any update?

## 2023-07-30 ENCOUNTER — Telehealth: Payer: Self-pay

## 2023-07-30 NOTE — Progress Notes (Unsigned)
 Complex Care Management Note Care Guide Note  07/30/2023 Name: Anita Harrell MRN: [id number] DOB: [birth year]   Complex Care Management Outreach Attempts: An unsuccessful telephone outreach was attempted today to offer the patient information about available complex care management services.  Follow Up Plan:  Additional outreach attempts will be made to offer the patient complex care management information and services.   Encounter Outcome:  No Answer  Lenton Rail , RMA     [place]  [facility], [facility] Guide  Direct Dial: [phone]  Website: [email or web address]

## 2023-07-31 ENCOUNTER — Ambulatory Visit (INDEPENDENT_AMBULATORY_CARE_PROVIDER_SITE_OTHER): Admitting: Family Medicine

## 2023-07-31 ENCOUNTER — Encounter: Payer: Self-pay | Admitting: Family Medicine

## 2023-07-31 ENCOUNTER — Ambulatory Visit: Payer: Self-pay | Admitting: Family Medicine

## 2023-07-31 VITALS — BP 122/68 | HR 60 | Temp 97.9°F | Ht 63.0 in | Wt 179.1 lb

## 2023-07-31 DIAGNOSIS — G4733 Obstructive sleep apnea (adult) (pediatric): Secondary | ICD-10-CM | POA: Insufficient documentation

## 2023-07-31 DIAGNOSIS — D72821 Monocytosis (symptomatic): Secondary | ICD-10-CM | POA: Diagnosis not present

## 2023-07-31 DIAGNOSIS — I480 Paroxysmal atrial fibrillation: Secondary | ICD-10-CM | POA: Diagnosis not present

## 2023-07-31 DIAGNOSIS — D696 Thrombocytopenia, unspecified: Secondary | ICD-10-CM | POA: Diagnosis not present

## 2023-07-31 DIAGNOSIS — E782 Mixed hyperlipidemia: Secondary | ICD-10-CM | POA: Diagnosis not present

## 2023-07-31 DIAGNOSIS — E876 Hypokalemia: Secondary | ICD-10-CM

## 2023-07-31 DIAGNOSIS — Z79899 Other long term (current) drug therapy: Secondary | ICD-10-CM

## 2023-07-31 DIAGNOSIS — J4489 Other specified chronic obstructive pulmonary disease: Secondary | ICD-10-CM | POA: Diagnosis not present

## 2023-07-31 DIAGNOSIS — F418 Other specified anxiety disorders: Secondary | ICD-10-CM

## 2023-07-31 DIAGNOSIS — E669 Obesity, unspecified: Secondary | ICD-10-CM | POA: Diagnosis not present

## 2023-07-31 DIAGNOSIS — R7303 Prediabetes: Secondary | ICD-10-CM

## 2023-07-31 DIAGNOSIS — N3281 Overactive bladder: Secondary | ICD-10-CM | POA: Diagnosis not present

## 2023-07-31 DIAGNOSIS — R2689 Other abnormalities of gait and mobility: Secondary | ICD-10-CM

## 2023-07-31 DIAGNOSIS — I10 Essential (primary) hypertension: Secondary | ICD-10-CM | POA: Diagnosis not present

## 2023-07-31 DIAGNOSIS — M51362 Other intervertebral disc degeneration, lumbar region with discogenic back pain and lower extremity pain: Secondary | ICD-10-CM

## 2023-07-31 DIAGNOSIS — M8580 Other specified disorders of bone density and structure, unspecified site: Secondary | ICD-10-CM | POA: Insufficient documentation

## 2023-07-31 LAB — BASIC METABOLIC PANEL WITH GFR
BUN: 6 mg/dL (ref 6–23)
CO2: 28 meq/L (ref 19–32)
Calcium: 9 mg/dL (ref 8.4–10.5)
Chloride: 104 meq/L (ref 96–112)
Creatinine, Ser: 0.49 mg/dL (ref 0.40–1.20)
GFR: 92.86 mL/min (ref >60.00)
Glucose, Bld: 107 mg/dL — ABNORMAL HIGH (ref 70–99)
Potassium: 3.7 meq/L (ref 3.5–5.1)
Sodium: 141 meq/L (ref 135–145)

## 2023-07-31 MED ORDER — POTASSIUM CHLORIDE CRYS ER 20 MEQ PO TBCR: 20.0000 meq | EXTENDED_RELEASE_TABLET | Freq: Every day | ORAL | 1 refills | Status: DC

## 2023-07-31 NOTE — Assessment & Plan Note (Signed)
 Lab Results  Component Value Date   K 3.0 (L) 07/24/2023   Now on 20 meq klor con daily- 5 d so far Lab today

## 2023-07-31 NOTE — Assessment & Plan Note (Signed)
 Discussed how this problem influences overall health and the risks it imposes  Reviewed plan for weight loss with lower calorie diet (via better food choices (lower glycemic and portion control) along with exercise building up to or more than 30 minutes 5 days per week including some aerobic activity and strength training

## 2023-07-31 NOTE — Assessment & Plan Note (Signed)
 Dexa 11/2021 Unsure if she will get another one  Several falls/no fracture  Unable to exercise due to severe back pain   Discussed fall prevention, supplements and exercise for bone density  Encouraged to start on ca and D

## 2023-07-31 NOTE — Assessment & Plan Note (Signed)
 Under care of cardiology/ Dr Mariah Milling  No clinical changes Per card note Maintaining normal sinus rhythm on Eliquis, metoprolol succinate 50 daily, flecainide 100 twice daily

## 2023-07-31 NOTE — Patient Instructions (Addendum)
 You are due for a tetanus shot-ask about that at the pharmacist  Also ask about RSV vaccine   I highly recommend a covid shot yearly  Also a flu shot in the fall  Ask your pulmonary (lung) doctor about pneumonia shot booster  RSV vaccine is also a idea - ask pharmacist   After you get your back shots talk to your spine specialist about PT (for back but also for balance)  If you need and order let us  know)   For bone health  Try to get 1200-1500 mg of calcium  per day with at least 2000 iu of vitamin D - divide it in 2 doses  If the calcium  constipates please get the D   Instead of cranberry juice/ take cranberry pills (the juice has a lot of sugar)   Now that appetite is coming back try and eat a little healthier  Try to get most of your carbohydrates from produce (with the exception of white potatoes) and whole grains Eat less bread/pasta/rice/snack foods/cereals/sweets and other items from the middle of the grocery store (processed carbs)  Avoid red meat/ fried foods/ egg yolks/ fatty breakfast meats/ butter, cheese and high fat dairy/ and shellfish   Have a talk with your spine specialist and your psychiatrist about the decreased alertness in the evening- medications may play a big role (for pain and mood)   Labs today for potassium

## 2023-07-31 NOTE — Assessment & Plan Note (Signed)
 In mod to severe pain  Taking hydrocodone    For injections soon

## 2023-07-31 NOTE — Assessment & Plan Note (Signed)
 bp in fair control at this time  BP Readings from Last 1 Encounters:  07/31/23 122/68   No changes needed Most recent labs reviewed  Disc lifstyle change with low sodium diet and exercise   Continues Toprol  xl 50 mg bid Spironolactone  25 mg daily  Lasix  20 mg daily  Replacing K now

## 2023-07-31 NOTE — Assessment & Plan Note (Addendum)
 Continues pulmonary follow up  Trellegy ellipta 100-62.5-25 mg one puff daily  Overall stable   Highly recommend  Rsv vaccine Flu /covid vaccines in fall   Consider update pna vaccine per pulmonary

## 2023-07-31 NOTE — Assessment & Plan Note (Signed)
 Lab Results  Component Value Date   HGBA1C 5.7 07/24/2023   HGBA1C 6.1 06/07/2021   HGBA1C 6.1 05/14/2020   disc imp of low glycemic diet and wt loss to prevent DM2

## 2023-07-31 NOTE — Progress Notes (Signed)
 Subjective:    Patient ID: Anita Harrell, female    DOB: [birth year], 73 y.o.   MRN: [id number]  HPI  Pt presents for annual follow up of chronic medical problems   Wt Readings from Last 3 Encounters:  07/31/23 179 lb 2 oz (81.3 kg)  07/27/23 175 lb (79.4 kg)  05/10/23 175 lb (79.4 kg)   31.73 kg/m  Vitals:   07/31/23 1054  BP: 122/68  Pulse: 60  Temp: 97.9 F (36.6 C)  SpO2: 96%    Immunization History  Administered Date(s) Administered   Fluad Quad(high Dose 65+) 01/16/2019, 11/29/2021   Fluad Trivalent(High Dose 65+) 02/06/2023   Influenza Split 03/27/2011, 01/23/2012   Influenza,inj,Quad PF,6+ Mos 11/26/2012, 11/10/2013, 11/24/2016, 12/25/2017   PFIZER(Purple Top)SARS-COV-2 Vaccination 10/20/2019, 11/10/2019   Pneumococcal Conjugate-13 08/21/2014   Pneumococcal Polysaccharide-23 05/19/2008, 08/30/2015   Td 03/13/2001   Tdap 01/23/2012   Zoster Recombinant(Shingrix) 05/04/2021, 06/21/2021   Zoster, Live 01/07/2013    There are no preventive care reminders to display for this patient. Not doing well Cannot walk due to back pain  Shots upcoming ? If she would be surgical candidate    Tetanus shot 2013  Mammogram 08/2022  Declines another  Self breast exam- no lumps   Gyn health no problems     Colon cancer screening  colonoscopy 11/2015 with 5 y recall due to polyp Pt declines    Bone health  Dexa  11/2021 mild osteopenia at the breast cancer  Falls- at least one  / some not complete falls (one injured her hip and back) Fractures none  Is open to PT in the future  Supplements -none     Exercise -none right now  Is unable to get out of wheelchair due to back pain   Copd Not smoking anything  Not vaping  Continues to see pulmonary  Using cpap for OSA  Is supposed to have added 02 soon     Derm care  No visits recently   Mood    07/27/2023    2:36 PM 04/03/2023    2:12 PM 03/12/2023    3:18 PM 12/04/2022    2:02 PM 05/18/2022     1:25 PM  Depression screen PHQ 2/9  Decreased Interest 3 2 2 2  0  Down, Depressed, Hopeless 3 2 1  0 0  PHQ - 2 Score 6 4 3 2  0  Altered sleeping 2 2 0 0 0  Tired, decreased energy 2 2  0 0  Change in appetite 1 1 2 1  0  Feeling bad or failure about yourself  1 2 3 3  0  Trouble concentrating 1 0 0 1 0  Moving slowly or fidgety/restless 1 0 0 1 0  Suicidal thoughts 3 0 0 0 0  PHQ-9 Score 17 11 8 8  0  Difficult doing work/chores Extremely dIfficult Somewhat difficult   Not difficult at all      04/03/2023    2:12 PM 12/04/2022    2:03 PM  GAD 7 : Generalized Anxiety Score  Nervous, Anxious, on Edge 1 2  Control/stop worrying 2 3  Worry too much - different things 2 3  Trouble relaxing 0 2  Restless 0 0  Easily annoyed or irritable 1 3  Afraid - awful might happen 3 0  Total GAD 7 Score 9 13  Anxiety Difficulty Somewhat difficult       Sees psychiatry  Trazodone  100 mg at bedtime  Paxil  10 mg daily  Cymbalta   60 mg daily  Hydroxyzine 25 mg bid  Clonazapem 0.5 mg daily  Xanax ? Daily prn  Mirtazapine 7.5 mg daily   Pt struggles  Nothing seems to help her unfortunately  Psychiatry is trying to send her to a therapist and she is resistant (had ? Childhood trauma)  Follow up is June 1   Having more trouble concentrating and functioning in the evening   Copd Pulmonary care Trelegy ellipta     HTN in setting of a fib   bp is stable today  No cp or palpitations or headaches or edema  No side effects to medicines  BP Readings from Last 3 Encounters:  07/31/23 122/68  05/10/23 116/60  04/03/23 118/80    Toprol  xl  50 mg bid  Spironolactone  25 mg daily  Lasix  daily   Eliquis   Flecainide    Lab Results  Component Value Date   TSH 1.48 07/24/2023     Lab Results  Component Value Date   NA 145 07/24/2023   K 3.0 (L) 07/24/2023   CO2 31 07/24/2023   GLUCOSE 108 (H) 07/24/2023   BUN 6 07/24/2023   CREATININE 0.55 07/24/2023   CALCIUM  9.1 07/24/2023   GFR  90.32 07/24/2023   GFRNONAA >60 11/25/2022   Takes 20 meq K daily - just started that  Has to cut pills in 1/2 due to size   Has been on it 5 d     Hyperlipidemia Lab Results  Component Value Date   CHOL 122 07/24/2023   CHOL 118 06/07/2021   CHOL 108 05/14/2020   Lab Results  Component Value Date   HDL 38.60 (L) 07/24/2023   HDL 41.30 06/07/2021   HDL 40.60 05/14/2020   Lab Results  Component Value Date   LDLCALC 53 07/24/2023   LDLCALC 37 06/07/2021   LDLCALC 39 05/14/2020   Lab Results  Component Value Date   TRIG 151.0 (H) 07/24/2023   TRIG 197.0 (H) 06/07/2021   TRIG 143.0 05/14/2020   Lab Results  Component Value Date   CHOLHDL 3 07/24/2023   CHOLHDL 3 06/07/2021   CHOLHDL 3 05/14/2020   Lab Results  Component Value Date   LDLDIRECT 73.0 08/21/2014   Crestor  10 mg daily  Less exercise- due to back pain    Eating  Lost appetite after covid  Just now coming back  Eats a lot of noodles     Prediabetes Lab Results  Component Value Date   HGBA1C 5.7 07/24/2023   HGBA1C 6.1 06/07/2021   HGBA1C 6.1 05/14/2020     Overactive bladder  Myrbetriq  25 mg daily   Thrombocytopenia  Lab Results  Component Value Date   WBC 10.8 (H) 07/24/2023   HGB 15.0 07/24/2023   HCT 45.3 07/24/2023   MCV 90.6 07/24/2023   PLT 124.0 (L) 07/24/2023    Chronic pain  Arthritis Spondylosis   Lab Results  Component Value Date   ALT 14 07/24/2023   AST 20 07/24/2023   ALKPHOS 116 07/24/2023   BILITOT 0.5 07/24/2023      Patient Active Problem List   Diagnosis Date Noted   OSA (obstructive sleep apnea) 07/31/2023   Osteopenia 07/31/2023   COPD with asthma (HCC) 04/03/2023   Hypophosphatemia 11/19/2022   Metabolic acidosis 11/19/2022   (HFpEF) heart failure with preserved ejection fraction (HCC) 11/18/2022   Paroxysmal atrial fibrillation (HCC) 11/18/2022   Thrombocytopenia (HCC) 11/18/2022   Hallucinations 05/17/2022   Polypharmacy 05/17/2022    General weakness 02/16/2022  Poor balance 02/16/2022   Falling 02/16/2022   Fall at home, initial encounter 02/13/2022   Hypokalemia 02/13/2022   HTN (hypertension) 02/13/2022   DDD (degenerative disc disease), lumbar 12/26/2021   Bilateral hip bursitis 10/05/2021   Atrial fibrillation with RVR (HCC) 10/05/2021   Estrogen deficiency 06/07/2021   Overactive bladder 10/17/2019   Medicare annual wellness visit, subsequent 01/16/2019   Thyroid  nodule 05/01/2018   Electronic cigarette use 04/03/2018   Obesity (BMI 30-39.9) 04/03/2018   Lung mass 02/01/2018   Smokers' cough (HCC) 01/01/2018   Microscopic hematuria 08/13/2012   Routine general medical examination at a health care facility 07/22/2012   Leukocytosis 03/30/2010   Prediabetes 02/21/2008   Allergic rhinitis 07/25/2007   Low back pain 06/26/2007   Personal history of goiter 07/27/2006   Hyperlipidemia 07/27/2006   PANIC DISORDER 07/27/2006   Former smoker 07/27/2006   Depression with anxiety 07/27/2006   Insomnia 07/27/2006   History of colonic polyps 06/09/2004   Past Medical History:  Diagnosis Date   Allergy    allergic rhinitis   Anxiety    Arrhythmia    atrial fibrillation   Back pain    Chest pain    CHF (congestive heart failure) (HCC)    COPD (chronic obstructive pulmonary disease) (HCC)    Depression    Epigastric pain    Goiter    History of tobacco abuse    Hyperglycemia    mild   Hyperlipidemia    Hypertension    Insomnia    Hx of   Labile blood pressure    Panic disorder    History of   Personal history of colonic polyps 06/09/2004   SVD (spontaneous vaginal delivery)    x 2   Past Surgical History:  Procedure Laterality Date   ABDOMINAL HYSTERECTOMY N/A 09/24/2012   Procedure: HYSTERECTOMY ABDOMINAL;  Surgeon: Deann Exon, MD;  Location: [facility];  Service: Gynecology;  Laterality: N/A;   BREAST BIOPSY Right 2016   CARDIOVERSION N/A 02/09/2022   Procedure: CARDIOVERSION;  Surgeon:  Devorah Fonder, MD;  Location: [facility];  Service: Cardiovascular;  Laterality: N/A;   CARDIOVERSION N/A 03/02/2022   Procedure: CARDIOVERSION;  Surgeon: Devorah Fonder, MD;  Location: [facility];  Service: Cardiovascular;  Laterality: N/A;   COLONOSCOPY     COLONOSCOPY  2017   ELBOW SURGERY  2004   EYE SURGERY     bilateral lasik   SALPINGOOPHORECTOMY Bilateral 09/24/2012   Procedure: SALPINGO OOPHORECTOMY;  Surgeon: Deann Exon, MD;  Location: [facility];  Service: Gynecology;  Laterality: Bilateral;   THYROID  SURGERY     B9 massess   WART FULGURATION N/A 09/24/2012   Procedure: FULGURATION VAGINAL WART;  Surgeon: Deann Exon, MD;  Location: [facility];  Service: Gynecology;  Laterality: N/A;   WISDOM TOOTH EXTRACTION     Social History   Tobacco Use   Smoking status: Former    Current packs/day: 0.00    Types: E-cigarettes, Cigarettes    Quit date: 2011    Years since quitting: 14.3   Smokeless tobacco: Never  Vaping Use   Vaping status: Former   Quit date: 11/18/2022  Substance Use Topics   Alcohol use: No    Alcohol/week: 0.0 standard drinks of alcohol   Drug use: No   Family History  Problem Relation Age of Onset   Hypertension Mother    Stroke Mother    Alcohol abuse Father    Cancer Father  lung CA   Heart disease Father        CHF   Cancer Sister        breast CA   Heart disease Sister        CHF from chemotx also has defib   Breast cancer Sister    Colon cancer Neg Hx    Esophageal cancer Neg Hx    Rectal cancer Neg Hx    Stomach cancer Neg Hx    Allergies  Allergen Reactions   Abilify [Aripiprazole]     Vision problem   Ambien  [Zolpidem ]     Hallucinations Ms change    Codeine Nausea And Vomiting    REACTION: Nausea and vomiting Pt has tolerated vicodin & percocet in the past   Cymbalta  [Duloxetine  Hcl]     More depressed/tearful    Lipitor [Atorvastatin]     Muscle pain    Vesicare  [Solifenacin ]     MS change   Wellbutrin  [Bupropion ]     Current Outpatient Medications on File Prior to Visit  Medication Sig Dispense Refill   acetaminophen  (TYLENOL ) 500 MG tablet Take 500 mg by mouth every 6 (six) hours as needed.     albuterol  (VENTOLIN  HFA) 108 (90 Base) MCG/ACT inhaler Inhale 2 puffs into the lungs every 6 (six) hours as needed for wheezing or shortness of breath. 8 g 11   ALPRAZolam  (XANAX ) 0.5 MG tablet Take 0.5 mg by mouth 2 (two) times daily as needed.     clonazePAM (KLONOPIN) 0.5 MG tablet Take 0.5 mg by mouth daily.     cyclobenzaprine  (FLEXERIL ) 10 MG tablet TAKE ONE HALF (1/2) TO ONE TABLET BY MOUTH AT BEDTIME AS NEEDED FOR MUSCLE SPASMS 30 tablet 0   DULoxetine  (CYMBALTA ) 60 MG capsule Take 60 mg by mouth daily.     ELIQUIS  5 MG TABS tablet TAKE ONE TABLET BY MOUTH TWICE A DAY 60 tablet 5   flecainide  (TAMBOCOR ) 100 MG tablet Take 1 tablet (100 mg total) by mouth 2 (two) times daily. 180 tablet 3   gabapentin (NEURONTIN) 300 MG capsule Take 600 mg by mouth at bedtime.     HYDROcodone -acetaminophen  (NORCO/VICODIN) 5-325 MG tablet Take 1 tablet by mouth 3 (three) times daily as needed.     hydrOXYzine (VISTARIL) 25 MG capsule Take 25 mg by mouth 2 (two) times daily.     Melatonin 5 MG CAPS Take by mouth.     metoprolol  succinate (TOPROL -XL) 50 MG 24 hr tablet TAKE ONE TABLET BY MOUTH TWICE A DAY WITH MEALS 180 tablet 3   mirabegron  ER (MYRBETRIQ ) 25 MG TB24 tablet Take 1 tablet (25 mg total) by mouth daily. 90 tablet 3   mirtazapine (REMERON) 7.5 MG tablet Take 7.5 mg by mouth at bedtime.     PARoxetine  (PAXIL ) 10 MG tablet Take 1 tablet by mouth daily.     potassium chloride  SA (KLOR-CON  M) 20 MEQ tablet Take 1 tablet (20 mEq total) by mouth daily. 30 tablet 0   rosuvastatin  (CRESTOR ) 10 MG tablet TAKE ONE TABLET BY MOUTH ONCE A DAY 90 tablet 0   spironolactone  (ALDACTONE ) 25 MG tablet Take 1 tablet (25 mg total) by mouth daily. 30 tablet 0   traMADol  (ULTRAM ) 50 MG tablet TAKE ONE (1) TO TWO (2) TABLETS BY MOUTH  EVERY EIGHT HOURS AS NEEDED FOR MODERATE PAIN 60 tablet 0   traZODone  (DESYREL ) 100 MG tablet Take 100 mg by mouth at bedtime as needed.     TRELEGY ELLIPTA   100-62.5-25 MCG/ACT AEPB INHALE ONE PUFF INTO THE LUNGS DAILY 60 each 11   furosemide  (LASIX ) 20 MG tablet Take 1 tablet (20 mg total) by mouth daily. 90 tablet 3   No current facility-administered medications on file prior to visit.    Review of Systems  Constitutional:  Positive for fatigue. Negative for activity change, appetite change, fever and unexpected weight change.  HENT:  Negative for congestion, ear pain, rhinorrhea, sinus pressure and sore throat.   Eyes:  Negative for pain, redness and visual disturbance.  Respiratory:  Negative for cough, shortness of breath and wheezing.   Cardiovascular:  Negative for chest pain and palpitations.  Gastrointestinal:  Negative for abdominal pain, blood in stool, constipation and diarrhea.  Endocrine: Negative for polydipsia and polyuria.  Genitourinary:  Negative for dysuria, frequency and urgency.  Musculoskeletal:  Positive for arthralgias and back pain. Negative for myalgias.  Skin:  Negative for pallor and rash.  Allergic/Immunologic: Negative for environmental allergies.  Neurological:  Negative for dizziness, syncope and headaches.  Hematological:  Negative for adenopathy. Does not bruise/bleed easily.  Psychiatric/Behavioral:  Positive for decreased concentration, dysphoric mood and sleep disturbance. Negative for confusion, self-injury and suicidal ideas. The patient is nervous/anxious.        Objective:   Physical Exam Constitutional:      General: She is not in acute distress.    Appearance: Normal appearance. She is well-developed. She is obese. She is not ill-appearing or diaphoretic.  HENT:     Head: Normocephalic and atraumatic.     Right Ear: Tympanic membrane, ear canal and external ear normal.     Left Ear: Tympanic membrane, ear canal and external ear normal.      Nose: Nose normal. No congestion.     Mouth/Throat:     Mouth: Mucous membranes are moist.     Pharynx: Oropharynx is clear. No posterior oropharyngeal erythema.  Eyes:     General: No scleral icterus.    Extraocular Movements: Extraocular movements intact.     Conjunctiva/sclera: Conjunctivae normal.     Pupils: Pupils are equal, round, and reactive to light.  Neck:     Thyroid : No thyromegaly.     Vascular: No carotid bruit or JVD.  Cardiovascular:     Rate and Rhythm: Normal rate and regular rhythm.     Pulses: Normal pulses.     Heart sounds: Normal heart sounds.     No gallop.  Pulmonary:     Effort: Pulmonary effort is normal. No respiratory distress.     Breath sounds: Normal breath sounds. No wheezing.     Comments: Good air exch Chest:     Chest wall: No tenderness.  Abdominal:     General: Bowel sounds are normal. There is no distension or abdominal bruit.     Palpations: Abdomen is soft. There is no mass.     Tenderness: There is no abdominal tenderness.     Hernia: No hernia is present.  Genitourinary:    Comments: Declines breast exam  Musculoskeletal:        General: No tenderness. Normal range of motion.     Cervical back: Normal range of motion and neck supple. No rigidity. No muscular tenderness.     Right lower leg: No edema.     Left lower leg: No edema.     Comments: No kyphosis   Lymphadenopathy:     Cervical: No cervical adenopathy.  Skin:    General: Skin is warm and dry.  Coloration: Skin is not pale.     Findings: No erythema or rash.     Comments: Solar lentigines diffusely Some sks   Neurological:     Mental Status: She is alert. Mental status is at baseline.     Cranial Nerves: No cranial nerve deficit.     Motor: No abnormal muscle tone.     Coordination: Coordination normal.     Gait: Gait normal.     Deep Tendon Reflexes: Reflexes are normal and symmetric. Reflexes normal.     Comments: Unable to get out of wheel chair unassisted  due to back pain   Psychiatric:        Mood and Affect: Mood is anxious and depressed.        Cognition and Memory: Cognition and memory normal.     Comments: Sedated- drifts off occational Candidly discusses symptoms and stressors             Assessment & Plan:   Problem List Items Addressed This Visit       Cardiovascular and Mediastinum   Paroxysmal atrial fibrillation (HCC)   Under care of cardiology/ Dr Gollan  No clinical changes Per card note Maintaining normal sinus rhythm on Eliquis , metoprolol  succinate 50 daily, flecainide  100 twice daily       HTN (hypertension) - Primary   bp in fair control at this time  BP Readings from Last 1 Encounters:  07/31/23 122/68   No changes needed Most recent labs reviewed  Disc lifstyle change with low sodium diet and exercise   Continues Toprol  xl 50 mg bid Spironolactone  25 mg daily  Lasix  20 mg daily  Replacing K now          Respiratory   COPD with asthma (HCC)   Continues pulmonary follow up  Trellegy ellipta 100-62.5-25 mg one puff daily  Overall stable   Highly recommend  Rsv vaccine Flu /covid vaccines in fall   Consider update pna vaccine per pulmonary        Musculoskeletal and Integument   Osteopenia   Dexa 11/2021 Unsure if she will get another one  Several falls/no fracture  Unable to exercise due to severe back pain   Discussed fall prevention, supplements and exercise for bone density  Encouraged to start on ca and D      DDD (degenerative disc disease), lumbar   In mod to severe pain  Taking hydrocodone    For injections soon         Genitourinary   Overactive bladder   Currently takes myrbetriq  25 mg daily  Is helpful          Hematopoietic and Hemostatic   Thrombocytopenia (HCC)   Plt ct increase to 124 Wbc slightly elevation -has had steroids intermittently   Lab Results  Component Value Date   WBC 10.8 (H) 07/24/2023   HGB 15.0 07/24/2023   HCT 45.3 07/24/2023    MCV 90.6 07/24/2023   PLT 124.0 (L) 07/24/2023           Other   Prediabetes   Lab Results  Component Value Date   HGBA1C 5.7 07/24/2023   HGBA1C 6.1 06/07/2021   HGBA1C 6.1 05/14/2020   disc imp of low glycemic diet and wt loss to prevent DM2       Poor balance   After next set of LS shots may be open to [facility] for PT      Polypharmacy   Pain med Psych meds Causing alterations  in ms at night  Urged to discuss with spine and psychiatric specialists       Obesity (BMI 30-39.9)   Discussed how this problem influences overall health and the risks it imposes  Reviewed plan for weight loss with lower calorie diet (via better food choices (lower glycemic and portion control) along with exercise building up to or more than 30 minutes 5 days per week including some aerobic activity and strength training         Leukocytosis   Mild Lab Results  Component Value Date   WBC 10.8 (H) 07/24/2023   HGB 15.0 07/24/2023   HCT 45.3 07/24/2023   MCV 90.6 07/24/2023   PLT 124.0 (L) 07/24/2023   Has had steroid shots       Hypokalemia   Lab Results  Component Value Date   K 3.0 (L) 07/24/2023   Now on 20 meq klor con daily- 5 d so far Lab today      Relevant Orders   Basic metabolic panel with GFR   Hyperlipidemia   Disc goals for lipids and reasons to control them Rev last labs with pt Rev low sat fat diet in detail LDL of 53 Continues crestor  10 mg daily       Depression with anxiety   Worsened in setting of chronic pain  Taking Trazodone  100 mg at bedtime  Paxil  10 mg daily  Cymbalta  60 mg daily  Hydroxyzine 25 mg bid  Clonazapem 0.5 mg daily  Xanax ? Daily prn  Mirtazapine 7.5 mg daily    Some pm lack of alertness possible due to polypharmacy  Strongly encouraged pt to say yes to some counseling For psych follow up early june

## 2023-07-31 NOTE — Assessment & Plan Note (Signed)
 After next set of LS shots may be open to [facility] for PT

## 2023-07-31 NOTE — Assessment & Plan Note (Signed)
 Currently takes myrbetriq  25 mg daily  Is helpful

## 2023-07-31 NOTE — Assessment & Plan Note (Signed)
 Worsened in setting of chronic pain  Taking Trazodone  100 mg at bedtime  Paxil  10 mg daily  Cymbalta  60 mg daily  Hydroxyzine 25 mg bid  Clonazapem 0.5 mg daily  Xanax ? Daily prn  Mirtazapine 7.5 mg daily    Some pm lack of alertness possible due to polypharmacy  Strongly encouraged pt to say yes to some counseling For psych follow up early june

## 2023-07-31 NOTE — Assessment & Plan Note (Signed)
 Mild Lab Results  Component Value Date   WBC 10.8 (H) 07/24/2023   HGB 15.0 07/24/2023   HCT 45.3 07/24/2023   MCV 90.6 07/24/2023   PLT 124.0 (L) 07/24/2023   Has had steroid shots

## 2023-07-31 NOTE — Assessment & Plan Note (Signed)
 Pain med Psych meds Causing alterations in ms at night  Urged to discuss with spine and psychiatric specialists

## 2023-07-31 NOTE — Assessment & Plan Note (Signed)
 Disc goals for lipids and reasons to control them Rev last labs with pt Rev low sat fat diet in detail LDL of 53 Continues crestor  10 mg daily

## 2023-07-31 NOTE — Assessment & Plan Note (Signed)
 Plt ct increase to 124 Wbc slightly elevation -has had steroids intermittently   Lab Results  Component Value Date   WBC 10.8 (H) 07/24/2023   HGB 15.0 07/24/2023   HCT 45.3 07/24/2023   MCV 90.6 07/24/2023   PLT 124.0 (L) 07/24/2023

## 2023-08-02 ENCOUNTER — Telehealth: Payer: Self-pay | Admitting: Pulmonary Disease

## 2023-08-02 NOTE — Telephone Encounter (Signed)
 Message was added to the telephone encounter from 5/15.  Closing this encounter.

## 2023-08-02 NOTE — Telephone Encounter (Signed)
 Mitch with [employer] stopped by he stated that since the patient now has Dx of OSA she will need to have Bipap Titration Study to qualify for 02 at night but again she will need to use Cpap. Patient was given 02 while in the hospital 11/2022 and just in 06/2023 she stated she wasn't using the 02 and ask [employer] to pick it up on 06/20/2023

## 2023-08-03 NOTE — Telephone Encounter (Signed)
 If she is willing to have a titration in lab we can go ahead and order that.  But previously she was not inclined to have.  We were trying to avoid an in lab study due to her issues with recent stroke.

## 2023-08-07 NOTE — Progress Notes (Signed)
 Complex Care Management Note  Care Guide Note 08/07/2023 Name: Anita Harrell MRN: [id number] DOB: 01-19-1951  Anita Harrell is a 73 y.o. year old female who sees Tower, Manley Seeds, MD for primary care. I reached out to [name] by phone today to offer complex care management services.  Ms. Kovich was given information about Complex Care Management services today including:   The Complex Care Management services include support from the care team which includes your Nurse Care Manager, Clinical Social Worker, or Pharmacist.  The Complex Care Management team is here to help remove barriers to the health concerns and goals most important to you. Complex Care Management services are voluntary, and the patient may decline or stop services at any time by request to their care team member.   Complex Care Management Consent Status: Patient did not agree to participate in complex care management services at this time.  Follow up plan:    Encounter Outcome:  Patient Refused  Lenton Rail , RMA     [facility]  [facility], [facility] Guide  Direct Dial: [phone]  Website: [email or web address]

## 2023-08-07 NOTE — Progress Notes (Signed)
 Complex Care Management Note Care Guide Note  08/07/2023 Name: Anita Harrell MRN: [id number] DOB: 1950/06/17   Complex Care Management Outreach Attempts: A third unsuccessful outreach was attempted today to offer the patient with information about available complex care management services.  Follow Up Plan:  No further outreach attempts will be made at this time. We have been unable to contact the patient to offer or enroll patient in complex care management services.  Encounter Outcome:  No Answer  Lenton Rail , RMA     [place]  [facility], [facility] Guide  Direct Dial: [phone]  Website: [email or web address]

## 2023-08-08 NOTE — Telephone Encounter (Signed)
 ATC the patient the phone just rang. I will try again later.   E2C2 please notify the patient if she calls back.

## 2023-08-09 ENCOUNTER — Encounter: Payer: Self-pay | Admitting: Student

## 2023-08-09 ENCOUNTER — Ambulatory Visit: Attending: Student | Admitting: Student

## 2023-08-09 VITALS — BP 110/60 | HR 56 | Ht 65.0 in | Wt 176.4 lb

## 2023-08-09 DIAGNOSIS — E7800 Pure hypercholesterolemia, unspecified: Secondary | ICD-10-CM | POA: Diagnosis not present

## 2023-08-09 DIAGNOSIS — I5032 Chronic diastolic (congestive) heart failure: Secondary | ICD-10-CM

## 2023-08-09 DIAGNOSIS — G4733 Obstructive sleep apnea (adult) (pediatric): Secondary | ICD-10-CM

## 2023-08-09 DIAGNOSIS — I480 Paroxysmal atrial fibrillation: Secondary | ICD-10-CM

## 2023-08-09 NOTE — Progress Notes (Signed)
 Cardiology Clinic Note   Date: 08/09/2023 ID: Akeria, Hedstrom 1950/03/16, MRN [id number]  Primary Cardiologist:  Belva Boyden, MD  Chief Complaint   Anita Harrell is a 73 y.o. female who presents to the clinic today for routine follow up.   Patient Profile   Anita Harrell is followed by Dr. Gollan for the history outlined below.      Past medical history significant for: Coronary artery calcifications/aortic atherosclerosis. Chronic HFpEF. Echo 02/14/2022: EF 50 to 55%.  No RWMA.  Mild LVH.  Indeterminate diastolic parameters.  Normal RV size/function.  Normal PA pressure.  Trivial MR. PAF. Onset May 2023. Exercise stress test 12/21/2021: Intermediate risk study secondary to poor exercise tolerance with no EKG evidence of ischemia.  Patient exercised for 1 minute 34 seconds with 3.8 METS of activity, normal blood pressure response. 14-day ZIO 01/13/2022: 100% A-fib burden, HR of 60 to 158 bpm, average 96 bpm.  1 patient triggered event associated with A-fib. Hyperlipidemia. Lipid panel 07/24/2023: LDL 53, HDL 39, TG 151, total 122. OSA. CPAP mostly adherent.  COPD. Former tobacco abuse (smoking and vaping).  In summary, patient was first evaluated by Dr. Gollan on 07/27/2021 for new onset A-fib.  She had been evaluated in the pulmonary clinic the day prior and noted to be in A-fib with elevated heart rate.  Dr. Viva Grise in pulmonology consulted cardiology and started patient on Eliquis .  Patient reported fatigue and shortness of breath.  She had been having issues with dyspnea navigating stairs for quite some time.  She was deconditioned at baseline.  Repeat EKG at the time of her visit demonstrated A-fib, HR 114 bpm.  She was instructed to increase Toprol  and hold lisinopril  HCTZ to avoid hypotension.  Echo June 2023 demonstrated EF 55 to 60%, no RWMA, indeterminate diastolic parameters, normal RV size/function, moderate LAE, mild MR.  She was started on flecainide  in  October 2023.  She underwent exercise stress test which was an intermediate risk secondary to poor exercise tolerance as detailed above.  14-day ZIO showed 100% A-fib burden.  She presented to the hospital for cardioversion in late November 2023 and was found to be in sinus rhythm.  Patient underwent hospital admission in early December 2023 with acute respiratory failure with hypoxia in the setting of COPD exacerbation and acute on chronic HFpEF.  Troponin negative x 2, BNP 128, D-dimer negative.  Echo showed normal LV/RV function as detailed above.  EKG demonstrated sinus rhythm with first-degree AV block.  She required supplemental O2 while hospitalized and was weaned prior to discharge.  She was treated with steroids, nebulizer treatments, and diuresis with symptomatic improvement.  Upon follow-up in February 2024 patient continued to complain of shortness of breath.  She remains sedentary and admitted to eating more.  She noted increased lower extremity edema and was started on Lasix .  Patient admitted to the hospital 11/18/2022 to 11/25/2022 with COVID-pneumonia.  Upon arrival to the ED SpO2 84% on room air improved to 92% on 4 L of supplemental O2 and subsequently changed to heated high flow.  She had electrolyte derangement.  BNP 222.  She was treated with IV steroids, Rocephin , Zithromax , IV Lasix .  SpO2 continued to drop into the mid 80s with ambulation throughout hospital admission.  She was discharged on supplemental O2.  Diltiazem  was held during hospitalization and not resumed at discharge.  Patient was last seen in the office by Dr. Gollan on 12/19/2022 for hospital follow-up.  She continued to  feel weak and was walking with the assistance of a walker.  She was instructed to continue to hold diltiazem .  No other medication changes were made.     History of Present Illness    Today, patient is accompanied by her husband.  She reports overall she feels she is doing well. Patient denies  shortness of breath, dyspnea on exertion, orthopnea or PND. She reports occasional L>R lower extremity edema. She limits her sodium intake and uses No Salt products "but not a lot." Discussed using these products sparingly secondary to them containing potassium.  Her husband reports she was eating a lot of potatoes and pasta secondary to having lost her taste after getting COVID.  She is slowly adding back more food now.  Her weight at home has been between 171 and 175 pounds.  No chest pain, pressure, or tightness. No palpitations.  She reports being mostly adherent with CPAP.  Pulmonology is going to add 2 L of O2 via nasal cannula at night.  She is mostly sedentary secondary to chronic back pain.    ROS: All other systems reviewed and are otherwise negative except as noted in History of Present Illness.  EKGs/Labs Reviewed    EKG Interpretation Date/Time:  Thursday Aug 09 2023 15:10:33 EDT Ventricular Rate:  56 PR Interval:  232 QRS Duration:  106 QT Interval:  470 QTC Calculation: 453 R Axis:   -12  Text Interpretation: Sinus bradycardia with 1st degree A-V block Nonspecific T wave abnormality.  Incomplete RBBB. When compared with ECG of 19-Dec-2022 14:59, No significant change Confirmed by Morey Ar [phone]) on 08/09/2023 3:15:24 PM   07/24/2023: ALT 14; AST 20 07/31/2023: BUN 6; Creatinine, Ser 0.49; Potassium 3.7; Sodium 141   07/24/2023: Hemoglobin 15.0; WBC 10.8   07/24/2023: TSH 1.48   11/18/2022: B Natriuretic Peptide 222.1   Risk Assessment/Calculations     CHA2DS2-VASc Score = 3   This indicates a 3.2% annual risk of stroke. The patient's score is based upon: CHF History: 1 HTN History: 0 Diabetes History: 0 Stroke History: 0 Vascular Disease History: 0 Age Score: 1 Gender Score: 1         Physical Exam    VS:  BP 110/60 (BP Location: Left Arm, Patient Position: Sitting, Cuff Size: Large)   Pulse (!) 56   Ht 5\' 5"  (1.651 m)   Wt 176 lb 6 oz (80 kg)    SpO2 91%   BMI 29.35 kg/m  , BMI Body mass index is 29.35 kg/m.  GEN: Well nourished, well developed, in no acute distress. Neck: No JVD or carotid bruits. Cardiac:  RRR.  No murmurs. No rubs or gallops.   Respiratory:  Respirations regular and unlabored. Clear to auscultation without rales, wheezing or rhonchi. GI: Soft, nontender, nondistended. Extremities: Radials/DP/PT 2+ and equal bilaterally. No clubbing or cyanosis. No edema.  Skin: Warm and dry, no rash. Neuro: Strength intact.  Assessment & Plan   Chronic HFpEF Echo December 2023 demonstrated normal LV/RV function, no RWMA, mild LVH, normal PA pressure, mild MR.  Patient reports occasional L>R lower extremity edema. Weight is stable between 171-175. She will skip dose of Lasix  if she has an appointment or to leave the house. She denies orthopnea or PND. Trace L>R lower extremity edema on exam.  Euvolemic and well compensated on exam. - Continue spironolactone , Toprol , Lasix .  PAF Onset May 2023.  Exercise stress test October 2023 was an intermediate risk study secondary to poor exercise tolerance, no EKG  changes significant for ischemia.  14-day ZIO November 2023 showed 100% A-fib burden.  Patient was scheduled for cardioversion late November 2023 and found to be in sinus rhythm.  No spontaneous bleeding concerns.  She denies palpitations. EKG shows sinus bradycardia with 1st AV block.  - Continue Toprol , flecainide , Eliquis .  Hyperlipidemia LDL 53 May 2025, at goal. - Continue rosuvastatin .  OSA Mostly adherent with CPAP. Pulmonology recently added 2L O2 via [place] at night. She has not started this yet.  - Encouraged compliance with CPAP and nighttime supplemental O2.   Disposition: Return in 6 months or sooner as needed.          Signed, Lonell Rives. Bradyn Soward, DNP, NP-C

## 2023-08-09 NOTE — Telephone Encounter (Signed)
 Lm x2 for the patient.

## 2023-08-09 NOTE — Patient Instructions (Signed)
 Medication Instructions:  Your Physician recommend you continue on your current medication as directed.    *If you need a refill on your cardiac medications before your next appointment, please call your pharmacy*  Lab Work: None ordered at this time  If you have labs (blood work) drawn today and your tests are completely normal, you will receive your results only by: MyChart Message (if you have MyChart) OR A paper copy in the mail If you have any lab test that is abnormal or we need to change your treatment, we will call you to review the results.  Testing/Procedures: None ordered at this time   Follow-Up: At [facility], you and your health needs are our priority.  As part of our continuing mission to provide you with exceptional heart care, our providers are all part of one team.  This team includes your primary Cardiologist (physician) and Advanced Practice Providers or APPs (Physician Assistants and Nurse Practitioners) who all work together to provide you with the care you need, when you need it.  Your next appointment:   6 month(s)  Provider:   You may see Timothy Gollan, MD or one of the following Advanced Practice Providers on your designated Care Team:   Laneta Pintos, NP Gildardo Labrador, PA-C Varney Gentleman, PA-C Cadence [place], PA-C Ronald Cockayne, NP Morey Ar, NP    We recommend signing up for the patient portal called "MyChart".  Sign up information is provided on this After Visit Summary.  MyChart is used to connect with patients for Virtual Visits (Telemedicine).  Patients are able to view lab/test results, encounter notes, upcoming appointments, etc.  Non-urgent messages can be sent to your provider as well.   To learn more about what you can do with MyChart, go to [email or web address].

## 2023-08-10 NOTE — Telephone Encounter (Signed)
 Yes this is the reason why I had scheduled it the way I did it with just overnight oximetry with her CPAP.  Will discuss with the patient on follow-up appointment.

## 2023-08-10 NOTE — Telephone Encounter (Signed)
 I have notified the patient. She said she does not sleep at home and does not think she would be able to sleep in the lab.  Dr. Viva Grise, just an FYI. She is scheduled to see you in June and I told her y'all would discuss it then as well.  Nothing further needed.

## 2023-08-13 DIAGNOSIS — F332 Major depressive disorder, recurrent severe without psychotic features: Secondary | ICD-10-CM | POA: Diagnosis not present

## 2023-08-13 DIAGNOSIS — F411 Generalized anxiety disorder: Secondary | ICD-10-CM | POA: Diagnosis not present

## 2023-08-13 DIAGNOSIS — F5101 Primary insomnia: Secondary | ICD-10-CM | POA: Diagnosis not present

## 2023-08-13 DIAGNOSIS — F03A3 Unspecified dementia, mild, with mood disturbance: Secondary | ICD-10-CM | POA: Diagnosis not present

## 2023-08-15 DIAGNOSIS — M5416 Radiculopathy, lumbar region: Secondary | ICD-10-CM | POA: Diagnosis not present

## 2023-08-17 DIAGNOSIS — E042 Nontoxic multinodular goiter: Secondary | ICD-10-CM | POA: Diagnosis not present

## 2023-08-21 ENCOUNTER — Encounter: Payer: Self-pay | Admitting: Pulmonary Disease

## 2023-08-21 NOTE — Telephone Encounter (Signed)
 Copied from CRM [id number]. Topic: General - Other >> Aug 21, 2023 11:07 AM Rosamond Comes wrote: Reason for CRM: patient husband Josefina Nian returning call from Dr Al Alias office, Josefina Nian couldn't remember the name of the person calling or what it is about.  Please call [phone]

## 2023-08-22 DIAGNOSIS — E042 Nontoxic multinodular goiter: Secondary | ICD-10-CM | POA: Diagnosis not present

## 2023-08-30 ENCOUNTER — Other Ambulatory Visit: Payer: Self-pay | Admitting: Cardiovascular Disease

## 2023-08-31 NOTE — Telephone Encounter (Signed)
 Prescription refill request for Eliquis  received. Indication:afib Last office visit:5/25 Scr:0.49  5/25 Age: 73 Weight:80  kg  Prescription refilled

## 2023-09-06 ENCOUNTER — Ambulatory Visit (INDEPENDENT_AMBULATORY_CARE_PROVIDER_SITE_OTHER): Payer: Medicare Other | Admitting: Pulmonary Disease

## 2023-09-06 ENCOUNTER — Encounter: Payer: Self-pay | Admitting: Pulmonary Disease

## 2023-09-06 VITALS — BP 120/60 | HR 51 | Temp 97.6°F | Ht 65.0 in | Wt 173.0 lb

## 2023-09-06 DIAGNOSIS — G4734 Idiopathic sleep related nonobstructive alveolar hypoventilation: Secondary | ICD-10-CM | POA: Diagnosis not present

## 2023-09-06 DIAGNOSIS — J449 Chronic obstructive pulmonary disease, unspecified: Secondary | ICD-10-CM | POA: Diagnosis not present

## 2023-09-06 DIAGNOSIS — G4730 Sleep apnea, unspecified: Secondary | ICD-10-CM | POA: Diagnosis not present

## 2023-09-06 DIAGNOSIS — R9389 Abnormal findings on diagnostic imaging of other specified body structures: Secondary | ICD-10-CM

## 2023-09-06 NOTE — Progress Notes (Addendum)
 Subjective:    Patient ID: Anita Harrell, female    DOB: Aug 26, 1950, 73 y.o.   MRN: [id number]  Patient Care Team: Tower, [name], MD as PCP - General (Family Medicine) Perla Evalene JINNY, MD as PCP - Cardiology (Cardiology) Rox Charleston, MD as Consulting Physician (Obstetrics and Gynecology) Tamea Dedra CROME, MD as Consulting Physician (Pulmonary Disease) Doles-Johnson, Teah, NP as Nurse Practitioner (Psychiatry) Fate Morna SAILOR, [facility] (Inactive) as Pharmacist (Pharmacist)  Chief Complaint  Patient presents with   Follow-up    Shortness of breath on exertion. Patient does not tolerate CPAP mask or pressure.     BACKGROUND/INTERVAL:Patient is a 73 year old former smoker who discontinued use of vaping nicotine containing substance (October 2024), who presents for follow-up from her 10 May 2023 visit.  Recall she had a hospitalization at [facility] between 18 November 2022 through 25 November 2022 due to acute respiratory failure with hypoxia in the setting of pneumonia and COVID-19 infection.  She had abnormal CT scan findings due to that.  The patient resolved her acute respiratory failure symptoms however there was still concern with regards to her CT findings.  She had follow-up CT chest 29 January 2023 and this showed resolution of the previously noted right lower lobe masslike opacity and a persistent groundglass nodule within the right apex which measures 7 mm.  Patient has follow-up CT ordered.  She had a sleep study that showed mild sleep apnea with AHI of 8.7.  She does not tolerate CPAP.  HPI Discussed the use of AI scribe software for clinical note transcription with the patient, who gave verbal consent to proceed.  History of Present Illness   Anita Harrell is a 73 year old female with mild COPD who presents for follow-up.  She is accompanied by her husband, [name].  She is concerned about her mild COPD and feels anxious about her health, questioning if she is  'dying'. She uses Trelegy, which she feels helps her condition, and has not needed her rescue inhaler. She has difficulty wearing a CPAP mask and has tried three different ones without success.  She is experiencing significant emotional distress, including crying spells and feelings of fading away. She is scheduled to see her psychiatrist for test results related to medication absorption, as she has tried many medications in the past. She wants to stop crying.  She is exceedingly emotionally labile and distraught during her visit.  She describes experiencing tremors, particularly in her head and sometimes in her arm, and has difficulty using her phone. There is a concern about potential dementia, as she has had episodes of seeing people who are not there, previously attributed to Ambien  use. She has not seen her neurologist in over a year.  She did not have a good experience with her prior neurology visit.  Her social history includes [place] and a tendency to shop online, resulting in [employer]. She is not smoking and uses a FUM device to keep her hands busy, which she finds helpful.     Review of Systems A 10 point review of systems was performed and it is as noted above otherwise negative.   Patient Active Problem List   Diagnosis Date Noted   OSA (obstructive sleep apnea) 07/31/2023   Osteopenia 07/31/2023   COPD with asthma (HCC) 04/03/2023   Hypophosphatemia 11/19/2022   Metabolic acidosis 11/19/2022   (HFpEF) heart failure with preserved ejection fraction (HCC) 11/18/2022   Paroxysmal atrial fibrillation (HCC) 11/18/2022  Thrombocytopenia (HCC) 11/18/2022   Hallucinations 05/17/2022   Polypharmacy 05/17/2022   General weakness 02/16/2022   Poor balance 02/16/2022   Falling 02/16/2022   Fall at home, initial encounter 02/13/2022   Hypokalemia 02/13/2022   HTN (hypertension) 02/13/2022   DDD (degenerative disc disease), lumbar 12/26/2021   Bilateral  hip bursitis 10/05/2021   Atrial fibrillation with RVR (HCC) 10/05/2021   Estrogen deficiency 06/07/2021   Overactive bladder 10/17/2019   Medicare annual wellness visit, subsequent 01/16/2019   Thyroid  nodule 05/01/2018   Electronic cigarette use 04/03/2018   Obesity (BMI 30-39.9) 04/03/2018   Lung mass 02/01/2018   Smokers' cough (HCC) 01/01/2018   Microscopic hematuria 08/13/2012   Routine general medical examination at a health care facility 07/22/2012   Leukocytosis 03/30/2010   Prediabetes 02/21/2008   Allergic rhinitis 07/25/2007   Low back pain 06/26/2007   Personal history of goiter 07/27/2006   Hyperlipidemia 07/27/2006   PANIC DISORDER 07/27/2006   Former smoker 07/27/2006   Depression with anxiety 07/27/2006   Insomnia 07/27/2006   History of colonic polyps 06/09/2004    Social History   Tobacco Use   Smoking status: Former    Current packs/day: 0.00    Types: E-cigarettes, Cigarettes    Quit date: 2011    Years since quitting: 14.4   Smokeless tobacco: Never  Substance Use Topics   Alcohol use: No    Alcohol/week: 0.0 standard drinks of alcohol    Allergies  Allergen Reactions   Abilify [Aripiprazole]     Vision problem   Ambien  [Zolpidem ]     Hallucinations Ms change    Codeine Nausea And Vomiting    REACTION: Nausea and vomiting Pt has tolerated vicodin & percocet in the past   Cymbalta  [Duloxetine  Hcl]     More depressed/tearful    Lipitor [Atorvastatin]     Muscle pain    Vesicare  [Solifenacin ]     MS change   Wellbutrin  [Bupropion ]     Current Meds  Medication Sig   acetaminophen  (TYLENOL ) 500 MG tablet Take 500 mg by mouth every 6 (six) hours as needed.   albuterol  (VENTOLIN  HFA) 108 (90 Base) MCG/ACT inhaler Inhale 2 puffs into the lungs every 6 (six) hours as needed for wheezing or shortness of breath.   ALPRAZolam  (XANAX ) 0.5 MG tablet Take 0.5 mg by mouth 2 (two) times daily as needed.   clonazePAM (KLONOPIN) 0.5 MG tablet Take 0.5  mg by mouth daily.   cyclobenzaprine  (FLEXERIL ) 10 MG tablet TAKE ONE HALF (1/2) TO ONE TABLET BY MOUTH AT BEDTIME AS NEEDED FOR MUSCLE SPASMS   DULoxetine  (CYMBALTA ) 60 MG capsule Take 60 mg by mouth daily.   ELIQUIS  5 MG TABS tablet TAKE ONE TABLET BY MOUTH TWICE A DAY   flecainide  (TAMBOCOR ) 100 MG tablet Take 1 tablet (100 mg total) by mouth 2 (two) times daily.   furosemide  (LASIX ) 20 MG tablet Take 1 tablet (20 mg total) by mouth daily.   gabapentin (NEURONTIN) 300 MG capsule Take 600 mg by mouth at bedtime.   HYDROcodone -acetaminophen  (NORCO/VICODIN) 5-325 MG tablet Take 1 tablet by mouth 3 (three) times daily as needed.   hydrOXYzine (VISTARIL) 25 MG capsule Take 25 mg by mouth 2 (two) times daily.   Melatonin 5 MG CAPS Take by mouth.   metoprolol  succinate (TOPROL -XL) 50 MG 24 hr tablet TAKE ONE TABLET BY MOUTH TWICE A DAY WITH MEALS   mirabegron  ER (MYRBETRIQ ) 25 MG TB24 tablet Take 1 tablet (25 mg total)  by mouth daily.   mirtazapine (REMERON) 7.5 MG tablet Take 7.5 mg by mouth at bedtime.   PARoxetine  (PAXIL ) 10 MG tablet Take 1 tablet by mouth daily.   potassium chloride  SA (KLOR-CON  M) 20 MEQ tablet Take 1 tablet (20 mEq total) by mouth daily.   rosuvastatin  (CRESTOR ) 10 MG tablet TAKE ONE TABLET BY MOUTH ONCE A DAY   spironolactone  (ALDACTONE ) 25 MG tablet Take 1 tablet (25 mg total) by mouth daily.   traMADol  (ULTRAM ) 50 MG tablet TAKE ONE (1) TO TWO (2) TABLETS BY MOUTH EVERY EIGHT HOURS AS NEEDED FOR MODERATE PAIN   traZODone  (DESYREL ) 100 MG tablet Take 100 mg by mouth at bedtime as needed.   TRELEGY ELLIPTA  100-62.5-25 MCG/ACT AEPB INHALE ONE PUFF INTO THE LUNGS DAILY    Immunization History  Administered Date(s) Administered   Fluad Quad(high Dose 65+) 01/16/2019, 11/29/2021   Fluad Trivalent(High Dose 65+) 02/06/2023   Influenza Split 03/27/2011, 01/23/2012   Influenza,inj,Quad PF,6+ Mos 11/26/2012, 11/10/2013, 11/24/2016, 12/25/2017   PFIZER(Purple Top)SARS-COV-2  Vaccination 10/20/2019, 11/10/2019   Pneumococcal Conjugate-13 08/21/2014   Pneumococcal Polysaccharide-23 05/19/2008, 08/30/2015   Td 03/13/2001   Tdap 01/23/2012   Zoster Recombinant(Shingrix) 05/04/2021, 06/21/2021   Zoster, Live 01/07/2013        Objective:     BP 120/60 (BP Location: Left Arm, Patient Position: Sitting, Cuff Size: Normal)   Pulse (!) 51   Temp 97.6 F (36.4 C) (Oral)   Ht 5' 5 (1.651 m)   Wt 173 lb (78.5 kg)   SpO2 95%   BMI 28.79 kg/m   SpO2: 95 %  GENERAL: Obese woman, no acute respiratory distress, presents in transport chair today, no conversational dyspnea.  She is very tearful. HEAD: Normocephalic, atraumatic.  EYES: Pupils equal, round, reactive to light.  No scleral icterus.  MOUTH: Teeth intact with caps on upper front teeth.  Pharynx clear, oral mucosa moist. NECK: Supple. No thyromegaly. Trachea midline. No JVD.  No adenopathy. PULMONARY: Good air entry bilaterally.  No adventitious sounds. CARDIOVASCULAR: S1 and S2.  Bradycardic regular rate at 51 bpm, no cardiac murmurs discerned. ABDOMEN: Obese, otherwise benign. MUSCULOSKELETAL: No joint deformity, no clubbing, no edema.  NEUROLOGIC: No overt focal deficit, no gait disturbance, speech is fluent. SKIN: Intact,warm,dry. PSYCH: Emotionally labile, tearful throughout visit.  Assessment & Plan:     ICD-10-CM   1. Stage 1 mild COPD by GOLD classification (HCC)  J44.9     2. Nocturnal hypoxemia  G47.34 Pulse oximetry, overnight    3. Abnormal CT of the chest  R93.89     4. Sleep-disordered breathing  G47.30 AMB REFERRAL FOR DME      Orders Placed This Encounter  Procedures   AMB REFERRAL FOR DME    Referral Priority:   Routine    Referral Type:   Durable Medical Equipment Purchase    Number of Visits Requested:   1   Pulse oximetry, overnight    Standing Status:   Future    Expected Date:   09/20/2023    Expiration Date:   09/05/2024    Scheduling Instructions:     On Room  Air   Discussion:    Mild COPD Mild COPD, well-managed with Trelegy. Lung function at 80%, which is near normal for COPD. No recent use of rescue inhaler. No acute exacerbations reported. - Consider trial of nasal cannula oxygen  at night - Order nocturnal oximetry test to assess oxygen  levels during sleep  History of mild sleep apnea  Intolerant of CPAP, compliance download shows poor tolerance.  Discontinue CPAP.  Depression Depression with significant emotional distress and anxiety about health status. Ongoing psychiatric evaluation with recent pharmacokinetic testing to optimize medication regimen. Symptoms include crying, tremors, and cognitive concerns. Previous hallucinations attributed to Ambien  use, now discontinued.  Note that tremors may also be related to flecainide  use for control of her A-fib. - Follow up with psychiatrist on Monday for pharmacokinetic test results and medication adjustment - Discuss potential referral to a neurologist with psychiatrist to evaluate tremors and cognitive concerns - Consider referral to Dr. Maree, a neurologist, if needed.  She has distrust of her prior neurologist.     Advised if symptoms do not improve or worsen, to please contact office for sooner follow up or seek emergency care.    I spent 40 minutes of dedicated to the care of this patient on the date of this encounter to include pre-visit review of records, face-to-face time with the patient discussing conditions above, post visit ordering of testing, clinical documentation with the electronic health record, making appropriate referrals as documented, and communicating necessary findings to members of the patients care team.     C. Leita Sanders, MD Advanced Bronchoscopy PCCM [facility]    *This note was generated using voice recognition software/Dragon and/or AI transcription program.  Despite best efforts to proofread, errors can occur which can change the meaning.  Any transcriptional errors that result from this process are unintentional and may not be fully corrected at the time of dictation.

## 2023-09-06 NOTE — Patient Instructions (Addendum)
 VISIT SUMMARY:  Today, we discussed your mild COPD and emotional distress. You shared concerns about your health, including anxiety, crying spells, and tremors. We reviewed your current treatments and planned next steps to address your symptoms.  YOUR PLAN:  -MILD COPD: Chronic Obstructive Pulmonary Disease (COPD) is a lung condition that makes it hard to breathe. Your condition is well-managed with Trelegy, and your lung function is near normal. You haven't needed your rescue inhaler recently. We will also order a nocturnal oximetry test to check your oxygen  levels while you sleep.  -DEPRESSION: Depression is a mood disorder that causes persistent feelings of sadness and loss of interest. You are experiencing significant emotional distress, including crying spells and anxiety about your health. You are scheduled to follow up with your psychiatrist on Monday to review test results and adjust your medication. We will also discuss the possibility of referring you to a neurologist to evaluate your tremors and cognitive concerns. If needed, we may refer you to Dr. Jannett Fairly, a neurologist with [facility].  INSTRUCTIONS:  Please follow up with your psychiatrist on Monday for your pharmacokinetic test results and medication adjustment. We will also schedule a nocturnal oximetry test to assess your oxygen  levels during sleep.Please discuss discuss a referral to Dr. Fairly, a neurologist, for further evaluation of your tremors and cognitive concerns.

## 2023-09-07 ENCOUNTER — Other Ambulatory Visit

## 2023-09-07 ENCOUNTER — Encounter: Payer: Self-pay | Admitting: Pulmonary Disease

## 2023-09-10 DIAGNOSIS — F332 Major depressive disorder, recurrent severe without psychotic features: Secondary | ICD-10-CM | POA: Diagnosis not present

## 2023-09-10 DIAGNOSIS — F411 Generalized anxiety disorder: Secondary | ICD-10-CM | POA: Diagnosis not present

## 2023-09-10 DIAGNOSIS — F03C3 Unspecified dementia, severe, with mood disturbance: Secondary | ICD-10-CM | POA: Diagnosis not present

## 2023-09-10 DIAGNOSIS — F5101 Primary insomnia: Secondary | ICD-10-CM | POA: Diagnosis not present

## 2023-09-18 DIAGNOSIS — M7062 Trochanteric bursitis, left hip: Secondary | ICD-10-CM | POA: Diagnosis not present

## 2023-09-18 DIAGNOSIS — M5116 Intervertebral disc disorders with radiculopathy, lumbar region: Secondary | ICD-10-CM | POA: Diagnosis not present

## 2023-09-18 DIAGNOSIS — M5416 Radiculopathy, lumbar region: Secondary | ICD-10-CM | POA: Diagnosis not present

## 2023-09-19 DIAGNOSIS — R441 Visual hallucinations: Secondary | ICD-10-CM | POA: Diagnosis not present

## 2023-09-19 DIAGNOSIS — R251 Tremor, unspecified: Secondary | ICD-10-CM | POA: Diagnosis not present

## 2023-09-19 DIAGNOSIS — M5124 Other intervertebral disc displacement, thoracic region: Secondary | ICD-10-CM | POA: Diagnosis not present

## 2023-09-19 DIAGNOSIS — M51369 Other intervertebral disc degeneration, lumbar region without mention of lumbar back pain or lower extremity pain: Secondary | ICD-10-CM | POA: Diagnosis not present

## 2023-09-19 DIAGNOSIS — G3184 Mild cognitive impairment, so stated: Secondary | ICD-10-CM | POA: Diagnosis not present

## 2023-09-19 DIAGNOSIS — Z1331 Encounter for screening for depression: Secondary | ICD-10-CM | POA: Diagnosis not present

## 2023-09-20 ENCOUNTER — Other Ambulatory Visit: Payer: Self-pay | Admitting: Neurology

## 2023-09-20 ENCOUNTER — Telehealth: Payer: Self-pay

## 2023-09-20 DIAGNOSIS — R251 Tremor, unspecified: Secondary | ICD-10-CM

## 2023-09-20 DIAGNOSIS — G4734 Idiopathic sleep related nonobstructive alveolar hypoventilation: Secondary | ICD-10-CM

## 2023-09-20 DIAGNOSIS — G3184 Mild cognitive impairment, so stated: Secondary | ICD-10-CM

## 2023-09-20 NOTE — Telephone Encounter (Signed)
 Copied from CRM [id number]. Topic: Clinical - Order For Equipment >> Sep 20, 2023  9:49 AM Anita Harrell wrote: Reason for CRM: Patient call regarding equipment.  Patient has two mask and cannot wear the mask. After a couple hours she pushes them off. She remembers talking about a device for the arm and would like to discuss that option. Please call patient.  Please advice

## 2023-09-21 NOTE — Telephone Encounter (Signed)
 Donzell please check on ONO order from 09/06/2023. Thank you!

## 2023-09-23 ENCOUNTER — Other Ambulatory Visit

## 2023-09-24 NOTE — Telephone Encounter (Signed)
 Urgent message was sent to [employer] on 09/21/23 Received message from [employer] with [employer] Anita Harrell   I have sent a message stating Can we get this scheduled asap. The patient continues to call into the doctors office.   I do not see any updates on this patient.

## 2023-09-25 ENCOUNTER — Ambulatory Visit
Admission: RE | Admit: 2023-09-25 | Discharge: 2023-09-25 | Disposition: A | Discharge status: Home / Self Care | Source: Ambulatory Visit | Attending: Neurology | Admitting: Neurology

## 2023-09-25 DIAGNOSIS — G319 Degenerative disease of nervous system, unspecified: Secondary | ICD-10-CM | POA: Diagnosis not present

## 2023-09-25 DIAGNOSIS — R251 Tremor, unspecified: Secondary | ICD-10-CM

## 2023-09-25 DIAGNOSIS — G3184 Mild cognitive impairment, so stated: Secondary | ICD-10-CM

## 2023-10-02 DIAGNOSIS — G4730 Sleep apnea, unspecified: Secondary | ICD-10-CM | POA: Diagnosis not present

## 2023-10-02 DIAGNOSIS — F03A3 Unspecified dementia, mild, with mood disturbance: Secondary | ICD-10-CM | POA: Diagnosis not present

## 2023-10-02 DIAGNOSIS — F411 Generalized anxiety disorder: Secondary | ICD-10-CM | POA: Diagnosis not present

## 2023-10-02 DIAGNOSIS — F332 Major depressive disorder, recurrent severe without psychotic features: Secondary | ICD-10-CM | POA: Diagnosis not present

## 2023-10-02 DIAGNOSIS — R0902 Hypoxemia: Secondary | ICD-10-CM | POA: Diagnosis not present

## 2023-10-02 DIAGNOSIS — F5101 Primary insomnia: Secondary | ICD-10-CM | POA: Diagnosis not present

## 2023-10-05 NOTE — Telephone Encounter (Signed)
 ONO reviewed by [name]- Low SpO2 83%. Patient qualifies for O2 at 2L at night.  I have notified the patient's husband (DPR) and placed the order.  Nothing further needed.

## 2023-10-05 NOTE — Addendum Note (Signed)
 Addended by: VICCI EVALENE DEL on: 10/05/2023 12:26 PM   Modules accepted: Orders

## 2023-10-08 ENCOUNTER — Other Ambulatory Visit: Payer: Self-pay | Admitting: Family Medicine

## 2023-10-08 NOTE — Telephone Encounter (Signed)
 FW: New 02 start at night only Received: Today Vannie Donzell GORMAN Vicci Evalene VEAR, CMA; Tamea Dedra CROME, MD I knew this is what would happen please see below  Donzell       Previous Messages    ----- Message ----- From: Jackson Macintosh Sent: 10/05/2023   2:56 PM EDT To: Adine Leer; Macintosh Jackson; Ephraim Dollar* Subject: RE: New 02 start at night only                We will need a titrated sleep study showing the Pt de-sats while on pap therapy. within the last 30days please since the PT has OSA. ----- Message ----- From: Jackson Macintosh Sent: 10/05/2023   2:16 PM EDT To: Adine Leer; Macintosh Jackson; Ephraim Dollar* Subject: RE: New 02 start at night only                Received, Thank you ----- Message ----- From: Vannie Donzell GORMAN Sent: 10/05/2023   2:13 PM EDT To: Adine Leer; Macintosh Jackson; Ephraim Dollar* Subject: New 02 start at night only                    DOB:04/27/1950  Order placed by [name].  Please advise.  Thank you, Donzell

## 2023-10-10 NOTE — Telephone Encounter (Signed)
 Not sure Dr. Randeen wants to prescribe this medication since she is seeing her.

## 2023-10-10 NOTE — Telephone Encounter (Signed)
 I have responded to this on a separate message.  Her obstructive sleep apnea is very MILD.  She does not tolerate CPAP.  She has memory deficits that could be affected by hypoxemia at nighttime.  I recommend supplemental oxygen .  Her hypoxemia is likely related to chronic obstructive pulmonary disease and cardiac disease.

## 2023-10-16 ENCOUNTER — Other Ambulatory Visit: Payer: Self-pay | Admitting: Internal Medicine

## 2023-10-16 DIAGNOSIS — E042 Nontoxic multinodular goiter: Secondary | ICD-10-CM

## 2023-11-01 DIAGNOSIS — F332 Major depressive disorder, recurrent severe without psychotic features: Secondary | ICD-10-CM | POA: Diagnosis not present

## 2023-11-01 DIAGNOSIS — F411 Generalized anxiety disorder: Secondary | ICD-10-CM | POA: Diagnosis not present

## 2023-11-01 DIAGNOSIS — F5101 Primary insomnia: Secondary | ICD-10-CM | POA: Diagnosis not present

## 2023-11-01 DIAGNOSIS — F03A3 Unspecified dementia, mild, with mood disturbance: Secondary | ICD-10-CM | POA: Diagnosis not present

## 2023-11-06 ENCOUNTER — Ambulatory Visit
Admission: RE | Admit: 2023-11-06 | Discharge: 2023-11-06 | Disposition: A | Discharge status: Home / Self Care | Source: Ambulatory Visit | Attending: Internal Medicine | Admitting: Internal Medicine

## 2023-11-06 ENCOUNTER — Other Ambulatory Visit (HOSPITAL_COMMUNITY)
Admission: RE | Admit: 2023-11-06 | Discharge: 2023-11-06 | Disposition: A | Discharge status: Home / Self Care | Source: Ambulatory Visit | Attending: Interventional Radiology | Admitting: Interventional Radiology

## 2023-11-06 DIAGNOSIS — E042 Nontoxic multinodular goiter: Secondary | ICD-10-CM | POA: Diagnosis not present

## 2023-11-06 DIAGNOSIS — E041 Nontoxic single thyroid nodule: Secondary | ICD-10-CM | POA: Diagnosis not present

## 2023-11-06 DIAGNOSIS — E079 Disorder of thyroid, unspecified: Secondary | ICD-10-CM | POA: Diagnosis not present

## 2023-11-08 LAB — CYTOLOGY - NON PAP

## 2023-11-15 NOTE — Telephone Encounter (Signed)
 I spoke with the patient. She currently has an O2 concentrator that she is using at night.   Nothing further needed.

## 2023-11-19 DIAGNOSIS — M7062 Trochanteric bursitis, left hip: Secondary | ICD-10-CM | POA: Diagnosis not present

## 2023-11-19 DIAGNOSIS — M5416 Radiculopathy, lumbar region: Secondary | ICD-10-CM | POA: Diagnosis not present

## 2023-11-27 ENCOUNTER — Encounter (HOSPITAL_COMMUNITY): Payer: Self-pay

## 2023-12-04 ENCOUNTER — Other Ambulatory Visit: Payer: Self-pay | Admitting: Cardiovascular Disease

## 2023-12-11 ENCOUNTER — Encounter: Payer: Self-pay | Admitting: Pulmonary Disease

## 2023-12-14 ENCOUNTER — Inpatient Hospital Stay (HOSPITAL_COMMUNITY)
Admission: EM | Admit: 2023-12-14 | Discharge: 2023-12-19 | DRG: 563 | Disposition: A | Discharge status: Skilled Nursing Facility | Attending: Internal Medicine | Admitting: Internal Medicine

## 2023-12-14 DIAGNOSIS — M47812 Spondylosis without myelopathy or radiculopathy, cervical region: Secondary | ICD-10-CM | POA: Diagnosis not present

## 2023-12-14 DIAGNOSIS — R0989 Other specified symptoms and signs involving the circulatory and respiratory systems: Secondary | ICD-10-CM | POA: Diagnosis not present

## 2023-12-14 DIAGNOSIS — F0280 Dementia in other diseases classified elsewhere without behavioral disturbance: Secondary | ICD-10-CM | POA: Diagnosis present

## 2023-12-14 DIAGNOSIS — I110 Hypertensive heart disease with heart failure: Secondary | ICD-10-CM | POA: Diagnosis present

## 2023-12-14 DIAGNOSIS — Z8249 Family history of ischemic heart disease and other diseases of the circulatory system: Secondary | ICD-10-CM | POA: Diagnosis not present

## 2023-12-14 DIAGNOSIS — R413 Other amnesia: Secondary | ICD-10-CM | POA: Diagnosis present

## 2023-12-14 DIAGNOSIS — S52022A Displaced fracture of olecranon process without intraarticular extension of left ulna, initial encounter for closed fracture: Principal | ICD-10-CM | POA: Diagnosis present

## 2023-12-14 DIAGNOSIS — Z823 Family history of stroke: Secondary | ICD-10-CM

## 2023-12-14 DIAGNOSIS — W109XXA Fall (on) (from) unspecified stairs and steps, initial encounter: Secondary | ICD-10-CM | POA: Diagnosis present

## 2023-12-14 DIAGNOSIS — I480 Paroxysmal atrial fibrillation: Secondary | ICD-10-CM | POA: Diagnosis not present

## 2023-12-14 DIAGNOSIS — Z803 Family history of malignant neoplasm of breast: Secondary | ICD-10-CM

## 2023-12-14 DIAGNOSIS — Z79899 Other long term (current) drug therapy: Secondary | ICD-10-CM

## 2023-12-14 DIAGNOSIS — S52125A Nondisplaced fracture of head of left radius, initial encounter for closed fracture: Secondary | ICD-10-CM | POA: Diagnosis present

## 2023-12-14 DIAGNOSIS — S3282XD Multiple fractures of pelvis without disruption of pelvic ring, subsequent encounter for fracture with routine healing: Secondary | ICD-10-CM | POA: Diagnosis not present

## 2023-12-14 DIAGNOSIS — E785 Hyperlipidemia, unspecified: Secondary | ICD-10-CM | POA: Diagnosis present

## 2023-12-14 DIAGNOSIS — E876 Hypokalemia: Secondary | ICD-10-CM | POA: Diagnosis present

## 2023-12-14 DIAGNOSIS — S199XXA Unspecified injury of neck, initial encounter: Secondary | ICD-10-CM | POA: Diagnosis not present

## 2023-12-14 DIAGNOSIS — W19XXXA Unspecified fall, initial encounter: Secondary | ICD-10-CM | POA: Diagnosis not present

## 2023-12-14 DIAGNOSIS — F32A Depression, unspecified: Secondary | ICD-10-CM | POA: Diagnosis present

## 2023-12-14 DIAGNOSIS — S32810D Multiple fractures of pelvis with stable disruption of pelvic ring, subsequent encounter for fracture with routine healing: Secondary | ICD-10-CM | POA: Diagnosis not present

## 2023-12-14 DIAGNOSIS — M19022 Primary osteoarthritis, left elbow: Secondary | ICD-10-CM | POA: Diagnosis present

## 2023-12-14 DIAGNOSIS — Z90722 Acquired absence of ovaries, bilateral: Secondary | ICD-10-CM

## 2023-12-14 DIAGNOSIS — M6281 Muscle weakness (generalized): Secondary | ICD-10-CM | POA: Diagnosis not present

## 2023-12-14 DIAGNOSIS — F418 Other specified anxiety disorders: Secondary | ICD-10-CM | POA: Diagnosis present

## 2023-12-14 DIAGNOSIS — Z90710 Acquired absence of both cervix and uterus: Secondary | ICD-10-CM

## 2023-12-14 DIAGNOSIS — Z885 Allergy status to narcotic agent status: Secondary | ICD-10-CM

## 2023-12-14 DIAGNOSIS — R2681 Unsteadiness on feet: Secondary | ICD-10-CM | POA: Diagnosis not present

## 2023-12-14 DIAGNOSIS — Z8616 Personal history of COVID-19: Secondary | ICD-10-CM

## 2023-12-14 DIAGNOSIS — S32810A Multiple fractures of pelvis with stable disruption of pelvic ring, initial encounter for closed fracture: Secondary | ICD-10-CM | POA: Diagnosis not present

## 2023-12-14 DIAGNOSIS — S0990XA Unspecified injury of head, initial encounter: Secondary | ICD-10-CM | POA: Diagnosis not present

## 2023-12-14 DIAGNOSIS — Z860100 Personal history of colon polyps, unspecified: Secondary | ICD-10-CM

## 2023-12-14 DIAGNOSIS — R2689 Other abnormalities of gait and mobility: Secondary | ICD-10-CM | POA: Diagnosis not present

## 2023-12-14 DIAGNOSIS — S3011XA Contusion of abdominal wall, initial encounter: Secondary | ICD-10-CM | POA: Diagnosis present

## 2023-12-14 DIAGNOSIS — R5381 Other malaise: Secondary | ICD-10-CM | POA: Diagnosis present

## 2023-12-14 DIAGNOSIS — S52122A Displaced fracture of head of left radius, initial encounter for closed fracture: Secondary | ICD-10-CM | POA: Diagnosis present

## 2023-12-14 DIAGNOSIS — G9389 Other specified disorders of brain: Secondary | ICD-10-CM | POA: Diagnosis not present

## 2023-12-14 DIAGNOSIS — G4733 Obstructive sleep apnea (adult) (pediatric): Secondary | ICD-10-CM | POA: Diagnosis not present

## 2023-12-14 DIAGNOSIS — M85822 Other specified disorders of bone density and structure, left upper arm: Secondary | ICD-10-CM | POA: Diagnosis present

## 2023-12-14 DIAGNOSIS — D72829 Elevated white blood cell count, unspecified: Secondary | ICD-10-CM | POA: Diagnosis present

## 2023-12-14 DIAGNOSIS — Y92009 Unspecified place in unspecified non-institutional (private) residence as the place of occurrence of the external cause: Secondary | ICD-10-CM | POA: Diagnosis not present

## 2023-12-14 DIAGNOSIS — J449 Chronic obstructive pulmonary disease, unspecified: Secondary | ICD-10-CM | POA: Diagnosis not present

## 2023-12-14 DIAGNOSIS — Z87891 Personal history of nicotine dependence: Secondary | ICD-10-CM | POA: Diagnosis not present

## 2023-12-14 DIAGNOSIS — F419 Anxiety disorder, unspecified: Secondary | ICD-10-CM | POA: Diagnosis present

## 2023-12-14 DIAGNOSIS — Z9981 Dependence on supplemental oxygen: Secondary | ICD-10-CM

## 2023-12-14 DIAGNOSIS — M4982 Spondylopathy in diseases classified elsewhere, cervical region: Secondary | ICD-10-CM | POA: Diagnosis not present

## 2023-12-14 DIAGNOSIS — J99 Respiratory disorders in diseases classified elsewhere: Secondary | ICD-10-CM | POA: Diagnosis not present

## 2023-12-14 DIAGNOSIS — Z811 Family history of alcohol abuse and dependence: Secondary | ICD-10-CM

## 2023-12-14 DIAGNOSIS — Z888 Allergy status to other drugs, medicaments and biological substances status: Secondary | ICD-10-CM

## 2023-12-14 DIAGNOSIS — S3282XA Multiple fractures of pelvis without disruption of pelvic ring, initial encounter for closed fracture: Secondary | ICD-10-CM | POA: Diagnosis present

## 2023-12-14 DIAGNOSIS — Z7901 Long term (current) use of anticoagulants: Secondary | ICD-10-CM

## 2023-12-14 DIAGNOSIS — D7581 Myelofibrosis: Secondary | ICD-10-CM | POA: Diagnosis not present

## 2023-12-14 DIAGNOSIS — S3289XA Fracture of other parts of pelvis, initial encounter for closed fracture: Secondary | ICD-10-CM | POA: Diagnosis not present

## 2023-12-14 DIAGNOSIS — S32502A Unspecified fracture of left pubis, initial encounter for closed fracture: Secondary | ICD-10-CM | POA: Diagnosis not present

## 2023-12-14 DIAGNOSIS — S32000A Wedge compression fracture of unspecified lumbar vertebra, initial encounter for closed fracture: Secondary | ICD-10-CM | POA: Diagnosis not present

## 2023-12-14 DIAGNOSIS — S51812A Laceration without foreign body of left forearm, initial encounter: Secondary | ICD-10-CM | POA: Diagnosis not present

## 2023-12-14 DIAGNOSIS — I5032 Chronic diastolic (congestive) heart failure: Secondary | ICD-10-CM | POA: Diagnosis present

## 2023-12-14 DIAGNOSIS — S32059A Unspecified fracture of fifth lumbar vertebra, initial encounter for closed fracture: Secondary | ICD-10-CM | POA: Diagnosis not present

## 2023-12-14 DIAGNOSIS — S79919A Unspecified injury of unspecified hip, initial encounter: Secondary | ICD-10-CM | POA: Diagnosis not present

## 2023-12-14 DIAGNOSIS — Z9079 Acquired absence of other genital organ(s): Secondary | ICD-10-CM

## 2023-12-14 DIAGNOSIS — M1712 Unilateral primary osteoarthritis, left knee: Secondary | ICD-10-CM | POA: Diagnosis not present

## 2023-12-14 DIAGNOSIS — S32002D Unstable burst fracture of unspecified lumbar vertebra, subsequent encounter for fracture with routine healing: Secondary | ICD-10-CM | POA: Diagnosis not present

## 2023-12-14 DIAGNOSIS — R9389 Abnormal findings on diagnostic imaging of other specified body structures: Secondary | ICD-10-CM | POA: Diagnosis not present

## 2023-12-14 DIAGNOSIS — Z7401 Bed confinement status: Secondary | ICD-10-CM | POA: Diagnosis not present

## 2023-12-14 DIAGNOSIS — Z801 Family history of malignant neoplasm of trachea, bronchus and lung: Secondary | ICD-10-CM

## 2023-12-14 DIAGNOSIS — G3183 Dementia with Lewy bodies: Secondary | ICD-10-CM | POA: Diagnosis present

## 2023-12-14 DIAGNOSIS — K5900 Constipation, unspecified: Secondary | ICD-10-CM | POA: Diagnosis not present

## 2023-12-14 DIAGNOSIS — Z043 Encounter for examination and observation following other accident: Secondary | ICD-10-CM | POA: Diagnosis not present

## 2023-12-14 DIAGNOSIS — S32119A Unspecified Zone I fracture of sacrum, initial encounter for closed fracture: Secondary | ICD-10-CM | POA: Diagnosis present

## 2023-12-14 DIAGNOSIS — M4802 Spinal stenosis, cervical region: Secondary | ICD-10-CM | POA: Diagnosis not present

## 2023-12-14 DIAGNOSIS — S299XXA Unspecified injury of thorax, initial encounter: Secondary | ICD-10-CM | POA: Diagnosis not present

## 2023-12-14 DIAGNOSIS — D696 Thrombocytopenia, unspecified: Secondary | ICD-10-CM | POA: Diagnosis not present

## 2023-12-14 DIAGNOSIS — S32811A Multiple fractures of pelvis with unstable disruption of pelvic ring, initial encounter for closed fracture: Secondary | ICD-10-CM | POA: Diagnosis present

## 2023-12-14 DIAGNOSIS — Z993 Dependence on wheelchair: Secondary | ICD-10-CM

## 2023-12-14 DIAGNOSIS — S52122D Displaced fracture of head of left radius, subsequent encounter for closed fracture with routine healing: Secondary | ICD-10-CM | POA: Diagnosis not present

## 2023-12-14 MED ORDER — FENTANYL CITRATE PF 50 MCG/ML IJ SOSY
PREFILLED_SYRINGE | INTRAMUSCULAR | Status: AC
  Filled 2023-12-14: qty 1

## 2023-12-14 MED ORDER — FENTANYL CITRATE PF 50 MCG/ML IJ SOSY
50.0000 ug | PREFILLED_SYRINGE | Freq: Once | INTRAMUSCULAR | Status: AC
  Administered 2023-12-15: 50 ug via INTRAVENOUS

## 2023-12-14 NOTE — ED Triage Notes (Signed)
 Pt bibgcems from home from mechanical fall onto left side. Pt c/o pain in left hip and elbow. Pt hit left side of head. No LOC. Pt received 50mg  IM fentanyl  with ems.  Bp 136/52 Hr 70 96% ra

## 2023-12-14 NOTE — ED Provider Notes (Signed)
 [place] EMERGENCY DEPARTMENT AT [facility] Provider Note   CSN: [id number] Arrival date & time: 12/14/23  2345     Patient presents with: [place]   Anita Harrell is a 73 y.o. female.   Brought to the emergency department for evaluation after a fall.  Patient reports that she lost her balance and fell down a couple of steps in her home.  She landed on her left side.  She is complaining of pain in the left hip and elbow.  She did hit the left side of her head but no loss of consciousness.  Patient is on Eliquis , received as a level 2 fall on thinners.       Prior to Admission medications   Medication Sig Start Date End Date Taking? Authorizing Provider  acetaminophen  (TYLENOL ) 500 MG tablet Take 500 mg by mouth in the morning and at bedtime.   Yes [provider]  albuterol  (VENTOLIN  HFA) 108 (90 Base) MCG/ACT inhaler Inhale 2 puffs into the lungs every 6 (six) hours as needed for wheezing or shortness of breath. 05/25/23  Yes Tamea Dedra CROME, MD  ALPRAZolam  (XANAX ) 0.5 MG tablet Take 0.5 mg by mouth 2 (two) times daily. 04/10/23  Yes [provider]  apixaban  (ELIQUIS ) 5 MG TABS tablet Take 5 mg by mouth 2 (two) times daily.   Yes [provider]  clonazePAM (KLONOPIN) 0.5 MG tablet Take 0.5 mg by mouth daily. 06/05/23  Yes [provider]  cyclobenzaprine  (FLEXERIL ) 10 MG tablet TAKE ONE HALF (1/2) TO ONE TABLET BY MOUTH AT BEDTIME AS NEEDED FOR MUSCLE SPASMS 05/01/23  Yes Tower, Marne A, MD  diazepam (VALIUM) 5 MG tablet Take 5 mg by mouth in the morning and at bedtime. 11/26/23  Yes [provider]  donepezil (ARICEPT) 5 MG tablet Take 5 mg by mouth daily. 12/04/23  Yes [provider]  DULoxetine  (CYMBALTA ) 60 MG capsule Take 60 mg by mouth daily. 04/11/22  Yes [provider]  flecainide  (TAMBOCOR ) 100 MG tablet Take 1 tablet (100 mg total) by mouth 2 (two) times daily. 12/19/22  Yes Gollan, Timothy J, MD   Fluticasone -Umeclidin-Vilant 100-62.5-25 MCG/ACT AEPB Inhale 1 puff into the lungs in the morning.   Yes [provider]  furosemide  (LASIX ) 20 MG tablet TAKE ONE TABLET (20 MG TOTAL) BY MOUTH DAILY. 12/05/23  Yes Gollan, Timothy J, MD  gabapentin (NEURONTIN) 300 MG capsule Take 600 mg by mouth at bedtime. 10/25/22  Yes [provider]  HYDROcodone -acetaminophen  (NORCO/VICODIN) 5-325 MG tablet Take 1 tablet by mouth 3 (three) times daily as needed. 03/08/23  Yes [provider]  hydrOXYzine (VISTARIL) 25 MG capsule Take 25 mg by mouth 2 (two) times daily. 04/11/22  Yes Doles-Johnson, Teah, NP  Melatonin 5 MG CAPS Take 10 mg by mouth at bedtime.   Yes [provider]  metoprolol  succinate (TOPROL -XL) 50 MG 24 hr tablet TAKE ONE TABLET BY MOUTH TWICE A DAY WITH MEALS 12/19/22  Yes Gollan, Timothy J, MD  mirabegron  ER (MYRBETRIQ ) 25 MG TB24 tablet Take 1 tablet (25 mg total) by mouth daily. 05/26/22  Yes Tower, [name], MD  mirtazapine (REMERON) 7.5 MG tablet Take 7.5 mg by mouth at bedtime. 02/22/23  Yes [provider]  PARoxetine  (PAXIL ) 10 MG tablet Take 1 tablet by mouth daily.   Yes Doles-Johnson, Teah, NP  potassium chloride  SA (KLOR-CON  M) 20 MEQ tablet Take 1 tablet (20 mEq total) by mouth daily. Patient taking differently:  Take 20 mEq by mouth 2 (two) times daily. 07/31/23  Yes Tower, [name], MD  rosuvastatin  (CRESTOR ) 10 MG tablet TAKE ONE TABLET BY MOUTH ONCE A DAY 10/10/23  Yes Tower, Marne A, MD  traMADol  (ULTRAM ) 50 MG tablet TAKE ONE (1) TO TWO (2) TABLETS BY MOUTH EVERY EIGHT HOURS AS NEEDED FOR MODERATE PAIN 02/13/23  Yes Tower, [name], MD  traZODone  (DESYREL ) 100 MG tablet Take 100 mg by mouth at bedtime. 03/05/23  Yes [provider]    Allergies: Abilify [aripiprazole], Ambien  [zolpidem ], Codeine, Cymbalta  [duloxetine  hcl], Lipitor [atorvastatin], Vesicare  [solifenacin ], and Wellbutrin  [bupropion ]    Review of Systems  Updated  Vital Signs BP (!) 100/53 (BP Location: Right Arm)   Pulse 74   Temp 97.6 F (36.4 C) (Oral)   Resp 16   SpO2 95%   Physical Exam Vitals and nursing note reviewed.  Constitutional:      General: She is not in acute distress.    Appearance: She is well-developed.  HENT:     Head: Normocephalic and atraumatic.     Mouth/Throat:     Mouth: Mucous membranes are moist.  Eyes:     General: Vision grossly intact. Gaze aligned appropriately.     Extraocular Movements: Extraocular movements intact.     Conjunctiva/sclera: Conjunctivae normal.  Cardiovascular:     Rate and Rhythm: Normal rate and regular rhythm.     Pulses: Normal pulses.     Heart sounds: Normal heart sounds, S1 normal and S2 normal. No murmur heard.    No friction rub. No gallop.  Pulmonary:     Effort: Pulmonary effort is normal. No respiratory distress.     Breath sounds: Normal breath sounds.  Abdominal:     General: Bowel sounds are normal.     Palpations: Abdomen is soft.     Tenderness: There is no abdominal tenderness. There is no guarding or rebound.     Hernia: No hernia is present.  Musculoskeletal:        General: No swelling.     Left shoulder: Normal.     Left elbow: Laceration (skin tear) present. No swelling or deformity. Tenderness present.     Left wrist: Normal.     Cervical back: Full passive range of motion without pain, normal range of motion and neck supple. No spinous process tenderness or muscular tenderness. Normal range of motion.     Left hip: Tenderness present. No deformity.     Left knee: No swelling, deformity, effusion or ecchymosis. Tenderness present.     Right lower leg: No edema.     Left lower leg: No edema.  Skin:    General: Skin is warm and dry.     Capillary Refill: Capillary refill takes less than 2 seconds.     Findings: No ecchymosis, erythema, rash or wound.  Neurological:     General: No focal deficit present.     Mental Status: She is alert and oriented to  person, place, and time.     GCS: GCS eye subscore is 4. GCS verbal subscore is 5. GCS motor subscore is 6.     Cranial Nerves: Cranial nerves 2-12 are intact.     Sensory: Sensation is intact.     Motor: Motor function is intact.     Coordination: Coordination is intact.  Psychiatric:        Attention and Perception: Attention normal.        Mood and Affect: Mood normal.  Speech: Speech normal.        Behavior: Behavior normal.     (all labs ordered are listed, but only abnormal results are displayed) Labs Reviewed  COMPREHENSIVE METABOLIC PANEL WITH GFR - Abnormal; Notable for the following components:      Result Value   CO2 20 (*)    Glucose, Bld 108 (*)    Total Protein 6.2 (*)    Albumin 3.3 (*)    All other components within normal limits  CBC - Abnormal; Notable for the following components:   WBC 21.1 (*)    Platelets 94 (*)    All other components within normal limits  URINALYSIS, ROUTINE W REFLEX MICROSCOPIC - Abnormal; Notable for the following components:   APPearance HAZY (*)    Ketones, ur 5 (*)    All other components within normal limits  PROTIME-INR - Abnormal; Notable for the following components:   Prothrombin Time 19.3 (*)    INR 1.5 (*)    All other components within normal limits  BASIC METABOLIC PANEL WITH GFR  CBC  SAMPLE TO BLOOD BANK    EKG: None  Radiology: CT Elbow Left Wo Contrast Result Date: 12/15/2023 EXAM: CT Left Upper Extremity, Without IV Contrast TECHNIQUE: Axial images were acquired through the left upper extremity without IV contrast. Reformatted images were reviewed. Automated exposure control, iterative reconstruction, and/or weight-based adjustment of the mA/kV was utilized to reduce the radiation dose to as low as reasonably achievable. COMPARISON: None available. CLINICAL HISTORY: Elbow trauma. Mechanical fall onto left side. Pt c/o pain in left hip and elbow. Pt hit left side of head. No LOC. FINDINGS: BONES AND JOINTS:  The osseous structures are mildly osteopenic. There is a minimally displaced anatomic aligned intraarticular fracture of the radial head best seen on image 53/7 and 80/3 with fracture fragments in anatomic alignment. The articular surface appears congruent. There is, additionally, a minimally displaced anatomically aligned fracture of the olecranon, best seen on image 43/7 and 80/3. Normal overall alignment. No dislocation. Moderate degenerative arthritis involving the ulnohumeral and radiocapitellar joints. SOFT TISSUES: Small left elbow effusion is present. There is infiltration within the antecubital fossa in keeping with edema and hemorrhage. No mass or hematoma identified. IMPRESSION: 1. Minimally displaced, anatomically aligned intraarticular fractures of the radial head and fracture of the olecranon. No dislocation. 2. Small left elbow effusion and antecubital fossa edema/hemorrhage. No mass or hematoma identified. 3. Moderate degenerative arthritis involving the ulnohumeral and radiocapitellar joints. Electronically signed by: Dorethia Molt MD 12/15/2023 03:02 AM EDT RP Workstation: [id number]   CT CHEST ABDOMEN PELVIS W CONTRAST Result Date: 12/15/2023 EXAM: CT CHEST, ABDOMEN AND PELVIS WITH CONTRAST 12/15/2023 01:57:15 AM TECHNIQUE: CT of the chest, abdomen and pelvis was performed with the administration of intravenous contrast, 75mL (iohexol  (OMNIPAQUE ) 350 MG/ML injection 75 mL IOHEXOL  350 MG/ML SOLN). Multiplanar reformatted images are provided for review. Automated exposure control, iterative reconstruction, and/or weight based adjustment of the mA/kV was utilized to reduce the radiation dose to as low as reasonably achievable. COMPARISON: Comparison examination is January 29, 2023. CLINICAL HISTORY: Polytrauma, blunt. Mechanical fall onto left side. Patient complains of pain in left hip and elbow. Patient hit left side of head. No loss of consciousness. FINDINGS: CHEST: MEDIASTINUM AND LYMPH  NODES: Mild coronary artery calcification. Global cardiac size within normal limits. No pericardial effusion. Central pulmonary arteries are of normal caliber. Moderate atherosclerotic calcification within the thoracic aorta. No aortic aneurysm. Surgical changes of left thyroidectomy identified. Multiple  nodules identified within the visual and right thyroid  lobe measuring up to 12 mm which was previously biopsied on November 06, 2023. No follow-up imaging recommended. No pathologic thoracic adenopathy. The esophagus is unremarkable. LUNGS AND PLEURA: Mild emphysema. 7 mm sub solid nodule within the right apex, image 40/5, stable. Stable right pleural thickening. No confluent pulmonary infiltrate. No pneumothorax or pleural effusion. Multiple healed right rib fractures are identified. ABDOMEN AND PELVIS: LIVER: Mild hepatic steatosis. GALLBLADDER AND BILE DUCTS: Cholelithiasis without superimposed pericholecystic inflammatory change. No intra or extrahepatic biliary ductal dilation. SPLEEN: No acute abnormality. PANCREAS: No acute abnormality. ADRENAL GLANDS: No acute abnormality. KIDNEYS, URETERS AND BLADDER: No stones in the kidneys or ureters. No hydronephrosis. No perinephric or periureteral stranding. Urinary bladder is unremarkable, with moderate acute interstitial hemorrhage within the space of retzius adjacent to the pubic symphyseal fractures demonstrating mild mass effect on the bladder. GI AND BOWEL: The stomach, small bowel, and large bowel are otherwise unremarkable. Appendix is normal. Moderate sigmoid diverticulosis. No free intraperitoneal gas or fluid. REPRODUCTIVE ORGANS: Uterus is absent. No adnexal masses. PERITONEUM AND RETROPERITONEUM: No ascites. VASCULATURE: Moderate aortoiliac atherosclerotic calcification. No aortic aneurysm. BONES AND SOFT TISSUES: Acute mildly displaced fractures of the left superior and inferior pubic rami at the pubic symphysis as well as the left sacral ala demonstrate  mild impaction in keeping with a lateral compression type injury. Acute superior endplate fracture of L5 with minimal loss of height. No retropulsion. Osseous structures are diffusely osteopenic. Mild lumbar levoscoliosis. Rotator cuff scarring. IMPRESSION: 1. Acute mildly displaced fractures of the left superior and inferior pubic rami at the pubic symphysis and the left sacral ala with mild impaction, consistent with a lateral compression type injury. Associated moderate acute interstitial extraperitoneal hemorrhage with mild mass effect on the bladder. No active extravasation. 2. Acute superior endplate fracture of L5 with minimal loss of height. No retropulsion. 3. Multiple healed right rib fractures. No acute bone abnormality within the thorax . 4. Additional incidental findings as noted above. Electronically signed by: Dorethia Molt MD 12/15/2023 02:42 AM EDT RP Workstation: [id number]   CT CERVICAL SPINE WO CONTRAST Result Date: 12/15/2023 CLINICAL DATA:  Status post trauma. EXAM: CT CERVICAL SPINE WITHOUT CONTRAST TECHNIQUE: Multidetector CT imaging of the cervical spine was performed without intravenous contrast. Multiplanar CT image reconstructions were also generated. RADIATION DOSE REDUCTION: This exam was performed according to the departmental dose-optimization program which includes automated exposure control, adjustment of the mA and/or kV according to patient size and/or use of iterative reconstruction technique. COMPARISON:  None Available. FINDINGS: Alignment: There is straightening of the normal cervical spine lordosis. Skull base and vertebrae: No acute fracture. No primary bone lesion or focal pathologic process. Soft tissues and spinal canal: No prevertebral fluid or swelling. No visible canal hematoma. Disc levels: Moderate severity multilevel endplate sclerosis is seen throughout the cervical spine with mild to moderate severity anterior osteophyte formation and posterior bony spurring  also present at the levels of C4-C5, C5-C6 and C6-C7. Moderate to marked severity intervertebral disc space narrowing is seen at C6-C7, with mild to moderate severity intervertebral disc space narrowing present throughout the remainder of the cervical spine. Marked severity bilateral multilevel facet joint hypertrophy is noted. Upper chest: Mild areas of scarring and/or atelectasis are seen within the bilateral apices. Other: Small thyroid  nodules are seen within the right lobe of the thyroid  gland. The thyroid  gland is not identified. IMPRESSION: 1. No acute fracture or subluxation in the cervical spine. 2. Moderate  to marked severity multilevel degenerative changes, as described above. Electronically Signed   By: Suzen Dials M.D.   On: 12/15/2023 00:34   CT HEAD WO CONTRAST Result Date: 12/15/2023 CLINICAL DATA:  Status post trauma. EXAM: CT HEAD WITHOUT CONTRAST TECHNIQUE: Contiguous axial images were obtained from the base of the skull through the vertex without intravenous contrast. RADIATION DOSE REDUCTION: This exam was performed according to the departmental dose-optimization program which includes automated exposure control, adjustment of the mA and/or kV according to patient size and/or use of iterative reconstruction technique. COMPARISON:  January 29, 2020 FINDINGS: Brain: There is generalized cerebral atrophy with widening of the extra-axial spaces and ventricular dilatation. There are areas of decreased attenuation within the white matter tracts of the supratentorial brain, consistent with microvascular disease changes. Vascular: No hyperdense vessel or unexpected calcification. Skull: Normal. Negative for fracture or focal lesion. Sinuses/Orbits: No acute finding. Other: None. IMPRESSION: 1. No acute intracranial abnormality. 2. Generalized cerebral atrophy and microvascular disease changes of the supratentorial brain. Electronically Signed   By: Suzen Dials M.D.   On: 12/15/2023 00:29    DG Chest Port 1 View Result Date: 12/15/2023 CLINICAL DATA:  Status post trauma. EXAM: PORTABLE CHEST 1 VIEW COMPARISON:  November 18, 2022 FINDINGS: The heart size and mediastinal contours are within normal limits. Low lung volumes are noted with mild elevation of the right hemidiaphragm. No acute infiltrate, pleural effusion or pneumothorax is identified. A mildly displaced ninth right rib fracture is suspected. Degenerative changes are seen involving both shoulders and multiple levels of the thoracic spine. IMPRESSION: 1. Low lung volumes without acute cardiopulmonary disease. 2. Suspected mildly displaced ninth right rib fracture. Correlation with physical examination is recommended to determine the presence of point tenderness. Electronically Signed   By: Suzen Dials M.D.   On: 12/15/2023 00:26   DG Hip Unilat W or Wo Pelvis 2-3 Views Left Result Date: 12/15/2023 CLINICAL DATA:  Status post fall. EXAM: DG HIP (WITH OR WITHOUT PELVIS) 2-3V LEFT COMPARISON:  None Available. FINDINGS: A fracture deformity of indeterminate age is seen involving the symphysis pubis on the left with extension to involve the adjacent portions of the left superior and left inferior pubic rami. There is no evidence of dislocation. Degenerative changes are seen in the form of joint space narrowing and acetabular sclerosis. IMPRESSION: Fracture of the symphysis pubis on the left with extension to involve the adjacent portions of the left superior and left inferior pubic rami. CT correlation is recommended. Electronically Signed   By: Suzen Dials M.D.   On: 12/15/2023 00:21   DG Elbow 2 Views Left Result Date: 12/15/2023 CLINICAL DATA:  Status post trauma. EXAM: LEFT ELBOW - 2 VIEW COMPARISON:  None Available. FINDINGS: The cortical borders of the left radial head and neck are irregular in appearance on the lateral view. This is of indeterminate age. There is no evidence of dislocation. A superficial soft tissue  laceration is seen along the medial aspect of the proximal left forearm. IMPRESSION: Findings which may represent a nondisplaced fracture of the left radial head and neck of indeterminate age. Correlation with physical examination is recommended to determine the presence of point tenderness. Electronically Signed   By: Suzen Dials M.D.   On: 12/15/2023 00:18   DG Knee Left Port Result Date: 12/15/2023 CLINICAL DATA:  Status post fall. EXAM: PORTABLE LEFT KNEE - 1-2 VIEW COMPARISON:  None Available. FINDINGS: No evidence of an acute fracture or dislocation. Marked severity tricompartmental  degenerative changes are seen. Soft tissues are unremarkable. IMPRESSION: Marked severity tricompartmental degenerative changes. Electronically Signed   By: Suzen Dials M.D.   On: 12/15/2023 00:15     Ultrasound ED FAST  Date/Time: 12/15/2023 1:37 AM  Performed by: Haze Lonni PARAS, MD Authorized by: Haze Lonni PARAS, MD  Procedure details:    Indications: blunt abdominal trauma and blunt chest trauma       Assess for:  Intra-abdominal fluid    Technique:  Abdominal and cardiac    Images: archived      Abdominal findings:    L kidney:  Visualized   R kidney:  Visualized   Liver:  Visualized    Bladder:  Visualized   Hepatorenal space visualized: identified     Splenorenal space: identified     Rectovesical free fluid: not identified     Splenorenal free fluid: not identified     Hepatorenal space free fluid: not identified   Cardiac findings:    Heart:  Visualized   Wall motion: identified     Pericardial effusion: not identified      Medications Ordered in the ED  flecainide  (TAMBOCOR ) tablet 100 mg (has no administration in time range)  metoprolol  succinate (TOPROL -XL) 24 hr tablet 50 mg (has no administration in time range)  traZODone  (DESYREL ) tablet 100 mg (has no administration in time range)  DULoxetine  (CYMBALTA ) DR capsule 60 mg (has no administration in time  range)  budesonide -glycopyrrolate-formoterol  (BREZTRI) 160-9-4.8 MCG/ACT inhaler 2 puff (has no administration in time range)  sodium chloride  flush (NS) 0.9 % injection 3 mL (has no administration in time range)  acetaminophen  (TYLENOL ) tablet 650 mg (has no administration in time range)    Or  acetaminophen  (TYLENOL ) suppository 650 mg (has no administration in time range)  oxyCODONE  (Oxy IR/ROXICODONE ) immediate release tablet 5 mg (has no administration in time range)  HYDROmorphone (DILAUDID) injection 0.5-1 mg (1 mg Intravenous Given 12/15/23 0508)  polyethylene glycol (MIRALAX / GLYCOLAX) packet 17 g (has no administration in time range)  prochlorperazine (COMPAZINE) injection 5 mg (has no administration in time range)  methocarbamol (ROBAXIN) injection 500 mg (has no administration in time range)  fentaNYL  (SUBLIMAZE ) injection 50 mcg (50 mcg Intravenous Given 12/15/23 0001)  fentaNYL  (SUBLIMAZE ) injection 25 mcg (25 mcg Intravenous Given 12/15/23 0053)  HYDROmorphone (DILAUDID) injection 0.5 mg (0.5 mg Intravenous Given 12/15/23 0153)  sodium chloride  0.9 % bolus 500 mL (0 mLs Intravenous Stopped 12/15/23 0240)  iohexol  (OMNIPAQUE ) 350 MG/ML injection 75 mL (75 mLs Intravenous Contrast Given 12/15/23 0158)  potassium chloride  SA (KLOR-CON  M) CR tablet 20 mEq (20 mEq Oral Given 12/15/23 0508)                                    Medical Decision Making Amount and/or Complexity of Data Reviewed Labs: ordered. Radiology: ordered.  Risk Prescription drug management. Decision regarding hospitalization.   Differential diagnosis considered includes, but not limited to: Blunt trauma including intracranial injury, spinal injury, thoracic injury, intra-abdominal and retroperitoneal injury, orthopedic injury  Presents to the emergency department for evaluation after a fall.  Patient is a level 2 trauma because she does take blood thinners.   Patient underwent CT head and cervical spine that  did not show any injuries.  She was complaining of left elbow and left hip and buttock pain, reports that she fell onto her left side.  Plain film x-rays  raised concern for possible radial head fracture in the elbow and did show bilateral pubic rami fractures.  Patient underwent CT scan of the elbow, chest abdomen and pelvis to further evaluate traumas which did reveal sacral alar fracture, left-sided pubic rami fractures and did confirm radial head fracture and also showed olecranon fracture.  Orthopedic findings discussed with Dr. Dale Hock, on-call for orthopedics.  He will see the patient in consultation.  Patient admitted to hospitalist service.  CRITICAL CARE Performed by: Lonni JINNY Seats   Total critical care time: 30 minutes  Critical care time was exclusive of separately billable procedures and treating other patients.  Critical care was necessary to treat or prevent imminent or life-threatening deterioration.  Critical care was time spent personally by me on the following activities: development of treatment plan with patient and/or surrogate as well as nursing, discussions with consultants, evaluation of patient's response to treatment, examination of patient, obtaining history from patient or surrogate, ordering and performing treatments and interventions, ordering and review of laboratory studies, ordering and review of radiographic studies, pulse oximetry and re-evaluation of patient's condition.      Final diagnoses:  Multiple closed pelvic fractures with disruption of pelvic circle, initial encounter (HCC)  Closed nondisplaced fracture of head of left radius, initial encounter  Closed fracture of olecranon process of left ulna, initial encounter    ED Discharge Orders     None          Seats Lonni JINNY, MD 12/15/23 [phone]

## 2023-12-15 ENCOUNTER — Other Ambulatory Visit: Payer: Self-pay

## 2023-12-15 ENCOUNTER — Emergency Department (HOSPITAL_COMMUNITY)

## 2023-12-15 ENCOUNTER — Other Ambulatory Visit (HOSPITAL_COMMUNITY)

## 2023-12-15 ENCOUNTER — Encounter (HOSPITAL_COMMUNITY): Payer: Self-pay | Admitting: Family Medicine

## 2023-12-15 DIAGNOSIS — S3011XA Contusion of abdominal wall, initial encounter: Secondary | ICD-10-CM | POA: Diagnosis present

## 2023-12-15 DIAGNOSIS — G4733 Obstructive sleep apnea (adult) (pediatric): Secondary | ICD-10-CM | POA: Diagnosis present

## 2023-12-15 DIAGNOSIS — J449 Chronic obstructive pulmonary disease, unspecified: Secondary | ICD-10-CM

## 2023-12-15 DIAGNOSIS — S52122A Displaced fracture of head of left radius, initial encounter for closed fracture: Secondary | ICD-10-CM | POA: Diagnosis not present

## 2023-12-15 DIAGNOSIS — I480 Paroxysmal atrial fibrillation: Secondary | ICD-10-CM

## 2023-12-15 DIAGNOSIS — F32A Depression, unspecified: Secondary | ICD-10-CM | POA: Diagnosis present

## 2023-12-15 DIAGNOSIS — I110 Hypertensive heart disease with heart failure: Secondary | ICD-10-CM | POA: Diagnosis present

## 2023-12-15 DIAGNOSIS — R413 Other amnesia: Secondary | ICD-10-CM | POA: Diagnosis present

## 2023-12-15 DIAGNOSIS — G3183 Dementia with Lewy bodies: Secondary | ICD-10-CM | POA: Diagnosis present

## 2023-12-15 DIAGNOSIS — F418 Other specified anxiety disorders: Secondary | ICD-10-CM

## 2023-12-15 DIAGNOSIS — S3282XA Multiple fractures of pelvis without disruption of pelvic ring, initial encounter for closed fracture: Secondary | ICD-10-CM | POA: Diagnosis present

## 2023-12-15 DIAGNOSIS — S3282XD Multiple fractures of pelvis without disruption of pelvic ring, subsequent encounter for fracture with routine healing: Secondary | ICD-10-CM | POA: Diagnosis not present

## 2023-12-15 DIAGNOSIS — S52022A Displaced fracture of olecranon process without intraarticular extension of left ulna, initial encounter for closed fracture: Secondary | ICD-10-CM | POA: Diagnosis not present

## 2023-12-15 DIAGNOSIS — S52125A Nondisplaced fracture of head of left radius, initial encounter for closed fracture: Secondary | ICD-10-CM | POA: Diagnosis present

## 2023-12-15 DIAGNOSIS — D72829 Elevated white blood cell count, unspecified: Secondary | ICD-10-CM | POA: Diagnosis present

## 2023-12-15 DIAGNOSIS — D696 Thrombocytopenia, unspecified: Secondary | ICD-10-CM | POA: Diagnosis present

## 2023-12-15 DIAGNOSIS — S32810A Multiple fractures of pelvis with stable disruption of pelvic ring, initial encounter for closed fracture: Secondary | ICD-10-CM | POA: Diagnosis not present

## 2023-12-15 DIAGNOSIS — S32119A Unspecified Zone I fracture of sacrum, initial encounter for closed fracture: Secondary | ICD-10-CM | POA: Diagnosis present

## 2023-12-15 DIAGNOSIS — Z87891 Personal history of nicotine dependence: Secondary | ICD-10-CM | POA: Diagnosis not present

## 2023-12-15 DIAGNOSIS — Y92009 Unspecified place in unspecified non-institutional (private) residence as the place of occurrence of the external cause: Secondary | ICD-10-CM | POA: Diagnosis not present

## 2023-12-15 DIAGNOSIS — E785 Hyperlipidemia, unspecified: Secondary | ICD-10-CM | POA: Diagnosis present

## 2023-12-15 DIAGNOSIS — S32502A Unspecified fracture of left pubis, initial encounter for closed fracture: Secondary | ICD-10-CM | POA: Diagnosis not present

## 2023-12-15 DIAGNOSIS — W109XXA Fall (on) (from) unspecified stairs and steps, initial encounter: Secondary | ICD-10-CM | POA: Diagnosis present

## 2023-12-15 DIAGNOSIS — Z8616 Personal history of COVID-19: Secondary | ICD-10-CM | POA: Diagnosis not present

## 2023-12-15 DIAGNOSIS — Z885 Allergy status to narcotic agent status: Secondary | ICD-10-CM | POA: Diagnosis not present

## 2023-12-15 DIAGNOSIS — S32059A Unspecified fracture of fifth lumbar vertebra, initial encounter for closed fracture: Secondary | ICD-10-CM | POA: Diagnosis not present

## 2023-12-15 DIAGNOSIS — E876 Hypokalemia: Secondary | ICD-10-CM | POA: Diagnosis present

## 2023-12-15 DIAGNOSIS — Z8249 Family history of ischemic heart disease and other diseases of the circulatory system: Secondary | ICD-10-CM | POA: Diagnosis not present

## 2023-12-15 DIAGNOSIS — W19XXXA Unspecified fall, initial encounter: Secondary | ICD-10-CM

## 2023-12-15 DIAGNOSIS — S32811A Multiple fractures of pelvis with unstable disruption of pelvic ring, initial encounter for closed fracture: Secondary | ICD-10-CM | POA: Diagnosis present

## 2023-12-15 DIAGNOSIS — I5032 Chronic diastolic (congestive) heart failure: Secondary | ICD-10-CM | POA: Diagnosis present

## 2023-12-15 DIAGNOSIS — S32000A Wedge compression fracture of unspecified lumbar vertebra, initial encounter for closed fracture: Secondary | ICD-10-CM | POA: Diagnosis present

## 2023-12-15 DIAGNOSIS — F0280 Dementia in other diseases classified elsewhere without behavioral disturbance: Secondary | ICD-10-CM | POA: Diagnosis present

## 2023-12-15 DIAGNOSIS — Z7901 Long term (current) use of anticoagulants: Secondary | ICD-10-CM | POA: Diagnosis not present

## 2023-12-15 LAB — COMPREHENSIVE METABOLIC PANEL WITH GFR
ALT: 28 U/L (ref 0–44)
AST: 25 U/L (ref 15–41)
Albumin: 3.3 g/dL — ABNORMAL LOW (ref 3.5–5.0)
Alkaline Phosphatase: 75 U/L (ref 38–126)
Anion gap: 15 (ref 5–15)
BUN: 9 mg/dL (ref 8–23)
CO2: 20 mmol/L — ABNORMAL LOW (ref 22–32)
Calcium: 9 mg/dL (ref 8.9–10.3)
Chloride: 102 mmol/L (ref 98–111)
Creatinine, Ser: 0.72 mg/dL (ref 0.44–1.00)
GFR, Estimated: >60 mL/min (ref >60)
Glucose, Bld: 108 mg/dL — ABNORMAL HIGH (ref 70–99)
Potassium: 3.5 mmol/L (ref 3.5–5.1)
Sodium: 137 mmol/L (ref 135–145)
Total Bilirubin: 0.9 mg/dL (ref 0.0–1.2)
Total Protein: 6.2 g/dL — ABNORMAL LOW (ref 6.5–8.1)

## 2023-12-15 LAB — URINALYSIS, ROUTINE W REFLEX MICROSCOPIC
Bilirubin Urine: NEGATIVE
Glucose, UA: NEGATIVE mg/dL
Hgb urine dipstick: NEGATIVE
Ketones, ur: 5 mg/dL — AB
Leukocytes,Ua: NEGATIVE
Nitrite: NEGATIVE
Protein, ur: NEGATIVE mg/dL
Specific Gravity, Urine: 1.029 (ref 1.005–1.030)
pH: 7 (ref 5.0–8.0)

## 2023-12-15 LAB — CBC
HCT: 36.7 % (ref 36.0–46.0)
HCT: 41.5 % (ref 36.0–46.0)
Hemoglobin: 12.3 g/dL (ref 12.0–15.0)
Hemoglobin: 13.9 g/dL (ref 12.0–15.0)
MCH: 29.6 pg (ref 26.0–34.0)
MCH: 29.9 pg (ref 26.0–34.0)
MCHC: 33.5 g/dL (ref 30.0–36.0)
MCHC: 33.5 g/dL (ref 30.0–36.0)
MCV: 88.4 fL (ref 80.0–100.0)
MCV: 89.2 fL (ref 80.0–100.0)
Platelets: 76 K/uL — ABNORMAL LOW (ref 150–400)
Platelets: 94 K/uL — ABNORMAL LOW (ref 150–400)
RBC: 4.15 MIL/uL (ref 3.87–5.11)
RBC: 4.65 MIL/uL (ref 3.87–5.11)
RDW: 12.4 % (ref 11.5–15.5)
RDW: 12.6 % (ref 11.5–15.5)
WBC: 18 K/uL — ABNORMAL HIGH (ref 4.0–10.5)
WBC: 21.1 K/uL — ABNORMAL HIGH (ref 4.0–10.5)
nRBC: 0 % (ref 0.0–0.2)
nRBC: 0 % (ref 0.0–0.2)

## 2023-12-15 LAB — BASIC METABOLIC PANEL WITH GFR
Anion gap: 9 (ref 5–15)
BUN: 5 mg/dL — ABNORMAL LOW (ref 8–23)
CO2: 22 mmol/L (ref 22–32)
Calcium: 8.6 mg/dL — ABNORMAL LOW (ref 8.9–10.3)
Chloride: 108 mmol/L (ref 98–111)
Creatinine, Ser: 0.54 mg/dL (ref 0.44–1.00)
GFR, Estimated: >60 mL/min (ref >60)
Glucose, Bld: 98 mg/dL (ref 70–99)
Potassium: 3.7 mmol/L (ref 3.5–5.1)
Sodium: 139 mmol/L (ref 135–145)

## 2023-12-15 LAB — PROTIME-INR
INR: 1.5 — ABNORMAL HIGH (ref 0.8–1.2)
Prothrombin Time: 19.3 s — ABNORMAL HIGH (ref 11.4–15.2)

## 2023-12-15 LAB — SAMPLE TO BLOOD BANK

## 2023-12-15 MED ORDER — METOPROLOL SUCCINATE ER 25 MG PO TB24
12.5000 mg | ORAL_TABLET | Freq: Two times a day (BID) | ORAL | Status: DC
Start: 2023-12-15 — End: 2023-12-19
  Administered 2023-12-15 – 2023-12-19 (×8): 12.5 mg via ORAL
  Filled 2023-12-15 (×9): qty 1

## 2023-12-15 MED ORDER — HYDROMORPHONE HCL 1 MG/ML IJ SOLN
0.5000 mg | Freq: Once | INTRAMUSCULAR | Status: AC
  Administered 2023-12-15: 0.5 mg via INTRAVENOUS
  Filled 2023-12-15: qty 1

## 2023-12-15 MED ORDER — FLECAINIDE ACETATE 100 MG PO TABS
100.0000 mg | ORAL_TABLET | Freq: Two times a day (BID) | ORAL | Status: DC
  Administered 2023-12-15 – 2023-12-19 (×9): 100 mg via ORAL
  Filled 2023-12-15 (×11): qty 1

## 2023-12-15 MED ORDER — SODIUM CHLORIDE 0.9 % IV BOLUS
500.0000 mL | Freq: Once | INTRAVENOUS | Status: AC
  Administered 2023-12-15: 500 mL via INTRAVENOUS

## 2023-12-15 MED ORDER — BUDESON-GLYCOPYRROL-FORMOTEROL 160-9-4.8 MCG/ACT IN AERO
2.0000 | INHALATION_SPRAY | Freq: Two times a day (BID) | RESPIRATORY_TRACT | Status: DC
  Administered 2023-12-15 – 2023-12-19 (×8): 2 via RESPIRATORY_TRACT
  Filled 2023-12-15: qty 5.9

## 2023-12-15 MED ORDER — OXYCODONE HCL 5 MG PO TABS
5.0000 mg | ORAL_TABLET | ORAL | Status: DC | PRN
  Administered 2023-12-15 – 2023-12-16 (×4): 5 mg via ORAL
  Filled 2023-12-15 (×4): qty 1

## 2023-12-15 MED ORDER — SODIUM CHLORIDE 0.9 % IV SOLN: INTRAVENOUS | Status: AC

## 2023-12-15 MED ORDER — POLYETHYLENE GLYCOL 3350 17 G PO PACK: 17.0000 g | PACK | Freq: Every day | ORAL | Status: DC | PRN

## 2023-12-15 MED ORDER — LIDOCAINE 5 % EX PTCH
1.0000 | MEDICATED_PATCH | CUTANEOUS | Status: DC
  Administered 2023-12-15 – 2023-12-19 (×5): 1 via TRANSDERMAL
  Filled 2023-12-15 (×5): qty 1

## 2023-12-15 MED ORDER — SODIUM CHLORIDE 0.9% FLUSH
3.0000 mL | Freq: Two times a day (BID) | INTRAVENOUS | Status: DC
  Administered 2023-12-15 – 2023-12-19 (×9): 3 mL via INTRAVENOUS

## 2023-12-15 MED ORDER — METOPROLOL SUCCINATE ER 25 MG PO TB24: 50.0000 mg | ORAL_TABLET | Freq: Two times a day (BID) | ORAL | Status: DC

## 2023-12-15 MED ORDER — TRAZODONE HCL 100 MG PO TABS
100.0000 mg | ORAL_TABLET | Freq: Every evening | ORAL | Status: DC | PRN
  Administered 2023-12-16 – 2023-12-17 (×2): 100 mg via ORAL
  Filled 2023-12-15 (×2): qty 1

## 2023-12-15 MED ORDER — PROCHLORPERAZINE EDISYLATE 10 MG/2ML IJ SOLN: 5.0000 mg | Freq: Four times a day (QID) | INTRAMUSCULAR | Status: DC | PRN

## 2023-12-15 MED ORDER — DICLOFENAC SODIUM 1 % EX GEL
2.0000 g | Freq: Four times a day (QID) | CUTANEOUS | Status: DC
  Administered 2023-12-15 – 2023-12-19 (×14): 2 g via TOPICAL
  Filled 2023-12-15 (×2): qty 100

## 2023-12-15 MED ORDER — METHOCARBAMOL 1000 MG/10ML IJ SOLN
500.0000 mg | Freq: Four times a day (QID) | INTRAMUSCULAR | Status: DC | PRN
Start: 2023-12-15 — End: 2023-12-19
  Administered 2023-12-15 – 2023-12-16 (×2): 500 mg via INTRAVENOUS
  Filled 2023-12-15 (×2): qty 10

## 2023-12-15 MED ORDER — FENTANYL CITRATE PF 50 MCG/ML IJ SOSY
25.0000 ug | PREFILLED_SYRINGE | Freq: Once | INTRAMUSCULAR | Status: AC
  Administered 2023-12-15: 25 ug via INTRAVENOUS

## 2023-12-15 MED ORDER — DULOXETINE HCL 60 MG PO CPEP
60.0000 mg | ORAL_CAPSULE | Freq: Two times a day (BID) | ORAL | Status: DC
Start: 2023-12-15 — End: 2023-12-19
  Administered 2023-12-15 – 2023-12-19 (×9): 60 mg via ORAL
  Filled 2023-12-15 (×9): qty 1

## 2023-12-15 MED ORDER — ACETAMINOPHEN 500 MG PO TABS
1000.0000 mg | ORAL_TABLET | Freq: Three times a day (TID) | ORAL | Status: DC
  Administered 2023-12-15 – 2023-12-19 (×13): 1000 mg via ORAL
  Filled 2023-12-15 (×13): qty 2

## 2023-12-15 MED ORDER — ACETAMINOPHEN 650 MG RE SUPP: 650.0000 mg | Freq: Four times a day (QID) | RECTAL | Status: DC | PRN

## 2023-12-15 MED ORDER — POTASSIUM CHLORIDE CRYS ER 20 MEQ PO TBCR
20.0000 meq | EXTENDED_RELEASE_TABLET | Freq: Once | ORAL | Status: AC
  Administered 2023-12-15: 20 meq via ORAL
  Filled 2023-12-15: qty 1

## 2023-12-15 MED ORDER — HYDROMORPHONE HCL 1 MG/ML IJ SOLN
0.5000 mg | INTRAMUSCULAR | Status: DC | PRN
  Administered 2023-12-15: 1 mg via INTRAVENOUS
  Filled 2023-12-15: qty 1

## 2023-12-15 MED ORDER — FENTANYL CITRATE PF 50 MCG/ML IJ SOSY
PREFILLED_SYRINGE | INTRAMUSCULAR | Status: AC
  Filled 2023-12-15: qty 1

## 2023-12-15 MED ORDER — IOHEXOL 350 MG/ML SOLN
75.0000 mL | Freq: Once | INTRAVENOUS | Status: AC | PRN
  Administered 2023-12-15: 75 mL via INTRAVENOUS

## 2023-12-15 MED ORDER — ACETAMINOPHEN 325 MG PO TABS: 650.0000 mg | ORAL_TABLET | Freq: Four times a day (QID) | ORAL | Status: DC | PRN

## 2023-12-15 NOTE — H&P (Addendum)
 History and Physical    Anita Harrell FMW:[id number] DOB: 1950-11-17 DOA: 12/14/2023  PCP: Randeen [name], MD   Patient coming from: Home   Chief Complaint: Pain in left hip and left elbow after a fall   HPI: Anita Harrell is a 73 y.o. female with medical history significant for hypertension, hyperlipidemia, atrial fibrillation on Eliquis , COPD, depression, anxiety, and chronic HFpEF who presents with pain in her left hip and left elbow after a fall at home.  Patient was in her usual state of health last night when she was going down some stairs at approximately 7 PM, lost her balance, and fell down approximately 3 stairs onto her left side.  She denies losing consciousness but has been experiencing severe pain in her left hip and left elbow.  She initially felt that she could manage the symptoms at home, but continued to have severe pain with any movement and her husband eventually convinced her to seek evaluation in the ED.  Patient reports being hospitalized due to COVID-19 approximately a year ago and states that she lost a considerable amount of weight at that time, became quite debilitated, and has required assistance with ambulation ever since then.  She typically uses a walker, wheelchair, or holds onto her husband while she ambulates.  She last took Eliquis  the evening of 12/14/23.   ED Course: Upon arrival to the ED, patient is found to be afebrile and saturating mid 90s on room air with stable BP.  Labs are most notable for normal creatinine, WBC 21,100, platelets 94,000, and INR 1.5.  Imaging is most remarkable for acute mildly displaced fractures of the left superior and inferior pubic rami at the pubic symphysis and a left sacral ala fracture, extraperitoneal hematoma compressing the urinary bladder without active extravasation, acute superior endplate fracture of L5 with minimal height loss, minimally displaced and anatomically aligned intra-articular fractures of the radial  head and olecranon on the left, and no acute intracranial findings.  Orthopedic surgery (Dr. Germaine) was consulted by the ED physician and the patient was treated with 500 mL of NS, fentanyl  x 2, and Dilaudid.  Long-arm splint and shoulder immobilizer were ordered.  Review of Systems:  All other systems reviewed and apart from HPI, are negative.  Past Medical History:  Diagnosis Date   Allergy    allergic rhinitis   Anxiety    Arrhythmia    atrial fibrillation   Back pain    Chest pain    CHF (congestive heart failure) (HCC)    COPD (chronic obstructive pulmonary disease) (HCC)    Depression    Epigastric pain    Goiter    History of tobacco abuse    Hyperglycemia    mild   Hyperlipidemia    Hypertension    Insomnia    Hx of   Labile blood pressure    Panic disorder    History of   Personal history of colonic polyps 06/09/2004   SVD (spontaneous vaginal delivery)    x 2    Past Surgical History:  Procedure Laterality Date   ABDOMINAL HYSTERECTOMY N/A 09/24/2012   Procedure: HYSTERECTOMY ABDOMINAL;  Surgeon: Peggye Gull, MD;  Location: [facility];  Service: Gynecology;  Laterality: N/A;   BREAST BIOPSY Right 2016   CARDIOVERSION N/A 02/09/2022   Procedure: CARDIOVERSION;  Surgeon: Perla Evalene JINNY, MD;  Location: [facility];  Service: Cardiovascular;  Laterality: N/A;   CARDIOVERSION N/A 03/02/2022   Procedure: CARDIOVERSION;  Surgeon: Perla Evalene  J, MD;  Location: [facility];  Service: Cardiovascular;  Laterality: N/A;   COLONOSCOPY     COLONOSCOPY  2017   ELBOW SURGERY  2004   EYE SURGERY     bilateral lasik   SALPINGOOPHORECTOMY Bilateral 09/24/2012   Procedure: SALPINGO OOPHORECTOMY;  Surgeon: Peggye Gull, MD;  Location: [facility];  Service: Gynecology;  Laterality: Bilateral;   THYROID  SURGERY     B9 massess   WART FULGURATION N/A 09/24/2012   Procedure: FULGURATION VAGINAL WART;  Surgeon: Peggye Gull, MD;  Location: [facility];  Service: Gynecology;  Laterality: N/A;    WISDOM TOOTH EXTRACTION      Social History:   reports that she quit smoking about 14 years ago. Her smoking use included e-cigarettes and cigarettes. She has never used smokeless tobacco. She reports that she does not drink alcohol and does not use drugs.  Allergies  Allergen Reactions   Abilify [Aripiprazole]     Vision problem   Ambien  [Zolpidem ]     Hallucinations Ms change    Codeine Nausea And Vomiting    REACTION: Nausea and vomiting Pt has tolerated vicodin & percocet in the past   Cymbalta  [Duloxetine  Hcl]     More depressed/tearful    Lipitor [Atorvastatin]     Muscle pain    Vesicare  [Solifenacin ]     MS change   Wellbutrin  [Bupropion ]     Family History  Problem Relation Age of Onset   Hypertension Mother    Stroke Mother    Alcohol abuse Father    Cancer Father        lung CA   Heart disease Father        CHF   Cancer Sister        breast CA   Heart disease Sister        CHF from chemotx also has defib   Breast cancer Sister    Colon cancer Neg Hx    Esophageal cancer Neg Hx    Rectal cancer Neg Hx    Stomach cancer Neg Hx      Prior to Admission medications   Medication Sig Start Date End Date Taking? Authorizing Provider  acetaminophen  (TYLENOL ) 500 MG tablet Take 500 mg by mouth every 6 (six) hours as needed.    [provider]  albuterol  (VENTOLIN  HFA) 108 (90 Base) MCG/ACT inhaler Inhale 2 puffs into the lungs every 6 (six) hours as needed for wheezing or shortness of breath. 05/25/23   Tamea Dedra CROME, MD  ALPRAZolam  (XANAX ) 0.5 MG tablet Take 0.5 mg by mouth 2 (two) times daily as needed. 04/10/23   [provider]  clonazePAM (KLONOPIN) 0.5 MG tablet Take 0.5 mg by mouth daily. 06/05/23   [provider]  cyclobenzaprine  (FLEXERIL ) 10 MG tablet TAKE ONE HALF (1/2) TO ONE TABLET BY MOUTH AT BEDTIME AS NEEDED FOR MUSCLE SPASMS 05/01/23   Tower, [name], MD  DULoxetine  (CYMBALTA ) 60 MG capsule Take 60 mg by mouth  daily. 04/11/22   [provider]  ELIQUIS  5 MG TABS tablet TAKE ONE TABLET BY MOUTH TWICE A DAY 08/31/23   Gollan, Monterrio Gerst J, MD  flecainide  (TAMBOCOR ) 100 MG tablet Take 1 tablet (100 mg total) by mouth 2 (two) times daily. 12/19/22   Gollan, Maura Braaten J, MD  furosemide  (LASIX ) 20 MG tablet TAKE ONE TABLET (20 MG TOTAL) BY MOUTH DAILY. 12/05/23   Gollan, Glenyce Randle J, MD  gabapentin (NEURONTIN) 300 MG capsule Take 600 mg by mouth at  bedtime. 10/25/22   [provider]  HYDROcodone -acetaminophen  (NORCO/VICODIN) 5-325 MG tablet Take 1 tablet by mouth 3 (three) times daily as needed. 03/08/23   [provider]  hydrOXYzine (VISTARIL) 25 MG capsule Take 25 mg by mouth 2 (two) times daily. 04/11/22   Doles-Johnson, Teah, NP  Melatonin 5 MG CAPS Take by mouth.    [provider]  metoprolol  succinate (TOPROL -XL) 50 MG 24 hr tablet TAKE ONE TABLET BY MOUTH TWICE A DAY WITH MEALS 12/19/22   Gollan, Jaquesha Boroff J, MD  mirabegron  ER (MYRBETRIQ ) 25 MG TB24 tablet Take 1 tablet (25 mg total) by mouth daily. 05/26/22   Tower, [name], MD  mirtazapine (REMERON) 7.5 MG tablet Take 7.5 mg by mouth at bedtime. 02/22/23   [provider]  PARoxetine  (PAXIL ) 10 MG tablet Take 1 tablet by mouth daily.    Doles-Johnson, Teah, NP  potassium chloride  SA (KLOR-CON  M) 20 MEQ tablet Take 1 tablet (20 mEq total) by mouth daily. 07/31/23   Tower, [name], MD  rosuvastatin  (CRESTOR ) 10 MG tablet TAKE ONE TABLET BY MOUTH ONCE A DAY 10/10/23   Tower, [name], MD  spironolactone  (ALDACTONE ) 25 MG tablet Take 1 tablet (25 mg total) by mouth daily. 11/26/22   Josette Ade, MD  traMADol  (ULTRAM ) 50 MG tablet TAKE ONE (1) TO TWO (2) TABLETS BY MOUTH EVERY EIGHT HOURS AS NEEDED FOR MODERATE PAIN 02/13/23   Tower, [name], MD  traZODone  (DESYREL ) 100 MG tablet Take 100 mg by mouth at bedtime as needed. 03/05/23   [provider]  TRELEGY ELLIPTA  100-62.5-25 MCG/ACT AEPB INHALE ONE PUFF INTO THE  LUNGS DAILY 03/21/23   Tamea Dedra CROME, MD    Physical Exam: Vitals:   12/14/23 2350 12/14/23 2351 12/15/23 0100 12/15/23 0200  BP: (!) 132/0 129/71 (!) 112/49 122/89  Pulse: (!) 102 (!) 102 (!) 58 65  Resp: 18 19 16 14   Temp:  97.7 F (36.5 C)    TempSrc:  Oral    SpO2: 100% 93% 96% 100%    Constitutional: NAD, calm  Eyes: PERTLA, lids and conjunctivae normal ENMT: Mucous membranes are moist. Posterior pharynx clear of any exudate or lesions.   Neck: supple, no masses  Respiratory: no wheezing, no crackles. No accessory muscle use.  Cardiovascular: S1 & S2 heard, regular rate and rhythm. No JVD. Abdomen: No tenderness, soft. Bowel sounds active.  Musculoskeletal: no clubbing / cyanosis. Left hip and elbow tenderness, neurovascularly intact.   Skin: Ecchymoses about the extremities. Warm, dry, well-perfused. Neurologic: CN 2-12 grossly intact. Sensation to light touch intact. Moving all extremities. Alert and oriented to person, place, and situation.  Psychiatric: Pleasant. Cooperative.    Labs and Imaging on Admission: I have personally reviewed following labs and imaging studies  CBC: Recent Labs  Lab 12/14/23 2359  WBC 21.1*  HGB 13.9  HCT 41.5  MCV 89.2  PLT 94*   Basic Metabolic Panel: Recent Labs  Lab 12/14/23 2359  NA 137  K 3.5  CL 102  CO2 20*  GLUCOSE 108*  BUN 9  CREATININE 0.72  CALCIUM  9.0   GFR: CrCl cannot be calculated (Unknown ideal weight.). Liver Function Tests: Recent Labs  Lab 12/14/23 2359  AST 25  ALT 28  ALKPHOS 75  BILITOT 0.9  PROT 6.2*  ALBUMIN 3.3*   No results for input(s): LIPASE, AMYLASE in the last 168 hours. No results for input(s): AMMONIA in the last 168 hours. Coagulation Profile: Recent Labs  Lab 12/14/23 2359  INR 1.5*   Cardiac Enzymes: No results for input(s): CKTOTAL, CKMB, CKMBINDEX, TROPONINI in the last 168 hours. BNP (last 3 results) No results for input(s): PROBNP in the last  8760 hours. HbA1C: No results for input(s): HGBA1C in the last 72 hours. CBG: No results for input(s): GLUCAP in the last 168 hours. Lipid Profile: No results for input(s): CHOL, HDL, LDLCALC, TRIG, CHOLHDL, LDLDIRECT in the last 72 hours. Thyroid  Function Tests: No results for input(s): TSH, T4TOTAL, FREET4, T3FREE, THYROIDAB in the last 72 hours. Anemia Panel: No results for input(s): VITAMINB12, FOLATE, FERRITIN, TIBC, IRON, RETICCTPCT in the last 72 hours. Urine analysis:    Component Value Date/Time   COLORURINE YELLOW 12/15/2023 0220   APPEARANCEUR HAZY (A) 12/15/2023 0220   LABSPEC 1.029 12/15/2023 0220   PHURINE 7.0 12/15/2023 0220   GLUCOSEU NEGATIVE 12/15/2023 0220   HGBUR NEGATIVE 12/15/2023 0220   BILIRUBINUR NEGATIVE 12/15/2023 0220   BILIRUBINUR  04/03/2023 1410     Comment:     Unable to read due to pt taking AZO   KETONESUR 5 (A) 12/15/2023 0220   PROTEINUR NEGATIVE 12/15/2023 0220   UROBILINOGEN  04/03/2023 1410     Comment:     Unable to read due to pt taking AZO   NITRITE NEGATIVE 12/15/2023 0220   LEUKOCYTESUR NEGATIVE 12/15/2023 0220   Sepsis Labs: @LABRCNTIP (procalcitonin:4,lacticidven:4) )No results found for this or any previous visit (from the past 240 hours).   Radiological Exams on Admission: CT Elbow Left Wo Contrast Result Date: 12/15/2023 EXAM: CT Left Upper Extremity, Without IV Contrast TECHNIQUE: Axial images were acquired through the left upper extremity without IV contrast. Reformatted images were reviewed. Automated exposure control, iterative reconstruction, and/or weight-based adjustment of the mA/kV was utilized to reduce the radiation dose to as low as reasonably achievable. COMPARISON: None available. CLINICAL HISTORY: Elbow trauma. Mechanical fall onto left side. Pt c/o pain in left hip and elbow. Pt hit left side of head. No LOC. FINDINGS: BONES AND JOINTS: The osseous structures are mildly  osteopenic. There is a minimally displaced anatomic aligned intraarticular fracture of the radial head best seen on image 53/7 and 80/3 with fracture fragments in anatomic alignment. The articular surface appears congruent. There is, additionally, a minimally displaced anatomically aligned fracture of the olecranon, best seen on image 43/7 and 80/3. Normal overall alignment. No dislocation. Moderate degenerative arthritis involving the ulnohumeral and radiocapitellar joints. SOFT TISSUES: Small left elbow effusion is present. There is infiltration within the antecubital fossa in keeping with edema and hemorrhage. No mass or hematoma identified. IMPRESSION: 1. Minimally displaced, anatomically aligned intraarticular fractures of the radial head and fracture of the olecranon. No dislocation. 2. Small left elbow effusion and antecubital fossa edema/hemorrhage. No mass or hematoma identified. 3. Moderate degenerative arthritis involving the ulnohumeral and radiocapitellar joints. Electronically signed by: Dorethia Molt MD 12/15/2023 03:02 AM EDT RP Workstation: [id number]   CT CHEST ABDOMEN PELVIS W CONTRAST Result Date: 12/15/2023 EXAM: CT CHEST, ABDOMEN AND PELVIS WITH CONTRAST 12/15/2023 01:57:15 AM TECHNIQUE: CT of the chest, abdomen and pelvis was performed with the administration of intravenous contrast, 75mL (iohexol  (OMNIPAQUE ) 350 MG/ML injection 75 mL IOHEXOL  350 MG/ML SOLN). Multiplanar reformatted images are provided for review. Automated exposure control, iterative reconstruction, and/or weight based adjustment of the mA/kV was utilized to reduce the radiation dose to as low as reasonably achievable. COMPARISON: Comparison examination is January 29, 2023. CLINICAL HISTORY: Polytrauma, blunt. Mechanical fall onto left side. Patient complains  of pain in left hip and elbow. Patient hit left side of head. No loss of consciousness. FINDINGS: CHEST: MEDIASTINUM AND LYMPH NODES: Mild coronary artery  calcification. Global cardiac size within normal limits. No pericardial effusion. Central pulmonary arteries are of normal caliber. Moderate atherosclerotic calcification within the thoracic aorta. No aortic aneurysm. Surgical changes of left thyroidectomy identified. Multiple nodules identified within the visual and right thyroid  lobe measuring up to 12 mm which was previously biopsied on November 06, 2023. No follow-up imaging recommended. No pathologic thoracic adenopathy. The esophagus is unremarkable. LUNGS AND PLEURA: Mild emphysema. 7 mm sub solid nodule within the right apex, image 40/5, stable. Stable right pleural thickening. No confluent pulmonary infiltrate. No pneumothorax or pleural effusion. Multiple healed right rib fractures are identified. ABDOMEN AND PELVIS: LIVER: Mild hepatic steatosis. GALLBLADDER AND BILE DUCTS: Cholelithiasis without superimposed pericholecystic inflammatory change. No intra or extrahepatic biliary ductal dilation. SPLEEN: No acute abnormality. PANCREAS: No acute abnormality. ADRENAL GLANDS: No acute abnormality. KIDNEYS, URETERS AND BLADDER: No stones in the kidneys or ureters. No hydronephrosis. No perinephric or periureteral stranding. Urinary bladder is unremarkable, with moderate acute interstitial hemorrhage within the space of retzius adjacent to the pubic symphyseal fractures demonstrating mild mass effect on the bladder. GI AND BOWEL: The stomach, small bowel, and large bowel are otherwise unremarkable. Appendix is normal. Moderate sigmoid diverticulosis. No free intraperitoneal gas or fluid. REPRODUCTIVE ORGANS: Uterus is absent. No adnexal masses. PERITONEUM AND RETROPERITONEUM: No ascites. VASCULATURE: Moderate aortoiliac atherosclerotic calcification. No aortic aneurysm. BONES AND SOFT TISSUES: Acute mildly displaced fractures of the left superior and inferior pubic rami at the pubic symphysis as well as the left sacral ala demonstrate mild impaction in keeping  with a lateral compression type injury. Acute superior endplate fracture of L5 with minimal loss of height. No retropulsion. Osseous structures are diffusely osteopenic. Mild lumbar levoscoliosis. Rotator cuff scarring. IMPRESSION: 1. Acute mildly displaced fractures of the left superior and inferior pubic rami at the pubic symphysis and the left sacral ala with mild impaction, consistent with a lateral compression type injury. Associated moderate acute interstitial extraperitoneal hemorrhage with mild mass effect on the bladder. No active extravasation. 2. Acute superior endplate fracture of L5 with minimal loss of height. No retropulsion. 3. Multiple healed right rib fractures. No acute bone abnormality within the thorax . 4. Additional incidental findings as noted above. Electronically signed by: Dorethia Molt MD 12/15/2023 02:42 AM EDT RP Workstation: [id number]   CT CERVICAL SPINE WO CONTRAST Result Date: 12/15/2023 CLINICAL DATA:  Status post trauma. EXAM: CT CERVICAL SPINE WITHOUT CONTRAST TECHNIQUE: Multidetector CT imaging of the cervical spine was performed without intravenous contrast. Multiplanar CT image reconstructions were also generated. RADIATION DOSE REDUCTION: This exam was performed according to the departmental dose-optimization program which includes automated exposure control, adjustment of the mA and/or kV according to patient size and/or use of iterative reconstruction technique. COMPARISON:  None Available. FINDINGS: Alignment: There is straightening of the normal cervical spine lordosis. Skull base and vertebrae: No acute fracture. No primary bone lesion or focal pathologic process. Soft tissues and spinal canal: No prevertebral fluid or swelling. No visible canal hematoma. Disc levels: Moderate severity multilevel endplate sclerosis is seen throughout the cervical spine with mild to moderate severity anterior osteophyte formation and posterior bony spurring also present at the levels  of C4-C5, C5-C6 and C6-C7. Moderate to marked severity intervertebral disc space narrowing is seen at C6-C7, with mild to moderate severity intervertebral disc space narrowing present throughout the  remainder of the cervical spine. Marked severity bilateral multilevel facet joint hypertrophy is noted. Upper chest: Mild areas of scarring and/or atelectasis are seen within the bilateral apices. Other: Small thyroid  nodules are seen within the right lobe of the thyroid  gland. The thyroid  gland is not identified. IMPRESSION: 1. No acute fracture or subluxation in the cervical spine. 2. Moderate to marked severity multilevel degenerative changes, as described above. Electronically Signed   By: Suzen Dials M.D.   On: 12/15/2023 00:34   CT HEAD WO CONTRAST Result Date: 12/15/2023 CLINICAL DATA:  Status post trauma. EXAM: CT HEAD WITHOUT CONTRAST TECHNIQUE: Contiguous axial images were obtained from the base of the skull through the vertex without intravenous contrast. RADIATION DOSE REDUCTION: This exam was performed according to the departmental dose-optimization program which includes automated exposure control, adjustment of the mA and/or kV according to patient size and/or use of iterative reconstruction technique. COMPARISON:  January 29, 2020 FINDINGS: Brain: There is generalized cerebral atrophy with widening of the extra-axial spaces and ventricular dilatation. There are areas of decreased attenuation within the white matter tracts of the supratentorial brain, consistent with microvascular disease changes. Vascular: No hyperdense vessel or unexpected calcification. Skull: Normal. Negative for fracture or focal lesion. Sinuses/Orbits: No acute finding. Other: None. IMPRESSION: 1. No acute intracranial abnormality. 2. Generalized cerebral atrophy and microvascular disease changes of the supratentorial brain. Electronically Signed   By: Suzen Dials M.D.   On: 12/15/2023 00:29   DG Chest Port 1  View Result Date: 12/15/2023 CLINICAL DATA:  Status post trauma. EXAM: PORTABLE CHEST 1 VIEW COMPARISON:  November 18, 2022 FINDINGS: The heart size and mediastinal contours are within normal limits. Low lung volumes are noted with mild elevation of the right hemidiaphragm. No acute infiltrate, pleural effusion or pneumothorax is identified. A mildly displaced ninth right rib fracture is suspected. Degenerative changes are seen involving both shoulders and multiple levels of the thoracic spine. IMPRESSION: 1. Low lung volumes without acute cardiopulmonary disease. 2. Suspected mildly displaced ninth right rib fracture. Correlation with physical examination is recommended to determine the presence of point tenderness. Electronically Signed   By: Suzen Dials M.D.   On: 12/15/2023 00:26   DG Hip Unilat W or Wo Pelvis 2-3 Views Left Result Date: 12/15/2023 CLINICAL DATA:  Status post fall. EXAM: DG HIP (WITH OR WITHOUT PELVIS) 2-3V LEFT COMPARISON:  None Available. FINDINGS: A fracture deformity of indeterminate age is seen involving the symphysis pubis on the left with extension to involve the adjacent portions of the left superior and left inferior pubic rami. There is no evidence of dislocation. Degenerative changes are seen in the form of joint space narrowing and acetabular sclerosis. IMPRESSION: Fracture of the symphysis pubis on the left with extension to involve the adjacent portions of the left superior and left inferior pubic rami. CT correlation is recommended. Electronically Signed   By: Suzen Dials M.D.   On: 12/15/2023 00:21   DG Elbow 2 Views Left Result Date: 12/15/2023 CLINICAL DATA:  Status post trauma. EXAM: LEFT ELBOW - 2 VIEW COMPARISON:  None Available. FINDINGS: The cortical borders of the left radial head and neck are irregular in appearance on the lateral view. This is of indeterminate age. There is no evidence of dislocation. A superficial soft tissue laceration is seen along  the medial aspect of the proximal left forearm. IMPRESSION: Findings which may represent a nondisplaced fracture of the left radial head and neck of indeterminate age. Correlation with physical examination  is recommended to determine the presence of point tenderness. Electronically Signed   By: Suzen Dials M.D.   On: 12/15/2023 00:18   DG Knee Left Port Result Date: 12/15/2023 CLINICAL DATA:  Status post fall. EXAM: PORTABLE LEFT KNEE - 1-2 VIEW COMPARISON:  None Available. FINDINGS: No evidence of an acute fracture or dislocation. Marked severity tricompartmental degenerative changes are seen. Soft tissues are unremarkable. IMPRESSION: Marked severity tricompartmental degenerative changes. Electronically Signed   By: Suzen Dials M.D.   On: 12/15/2023 00:15    Assessment/Plan   1. Left radial head and olecranon process fractures  - Continue immobilization, pain-control, and supportive care, hold Eliquis  and keep NPO pending orthopedic surgery consultation    2. Left pubic rami and sacral ala fractures with hematoma - Hold Eliquis  for now, continue pain-control and supportive care, follow-up on orthopedic surgery recommendations    3. L5 superior end plate fracture  - Minimal height-loss, no retropulsion, neurologically intact  - Pain-control, supportive care   4. PAF  - Continue flecainide  and Toprol , hold Eliquis  for now    5. Chronic HFpEF  - EF was 50-55% on echo from December 2023  - Appears compensated - Hold diuretics while NPO, monitor volume status    6. COPD; OSA - Not in exacerbation  - Continue ICS-LAMA-LABA, CPAP and 2 Lpm supplemental O2 while sleeping   7. Depression, anxiety  - Continue Cymbalta , trazodone     8. Leukocytosis  - WBC is 21,100 on admission without fever or apparent infectious process  - Likely reactive, repeat CBC tomorrow   9. Thrombocytopenia  - Appears chronic through lower currently than usual, will repeat CBC tomorrow     10.  Memory loss  - Followed by [facility] neurology and undergoing workup for possible Lewy body dementia  - Delirium precautions    DVT prophylaxis: SCDs  Code Status: Full  Level of Care: Level of care: Med-Surg Family Communication: Husband at bedside  Disposition Plan:  Patient is from: Home  Anticipated d/c is to: TBD Anticipated d/c date is: TBD Patient currently: Pending pain-control, orthopedic surgery consultation, disposition planning   Consults called: Orthopedic surgery  Admission status: Observation     Evalene GORMAN Sprinkles, MD [facility]  12/15/2023, 4:57 AM

## 2023-12-15 NOTE — Progress Notes (Signed)
 Orthopedic Tech Progress Note Patient Details:  Anita Harrell 10/29/1950 [id number]  Ortho Devices Type of Ortho Device: Sling immobilizer, Long arm splint, Post splint Splint Material: Fiberglass Ortho Device/Splint Location: L ELBOW Ortho Device/Splint Interventions: Ordered, Application, Adjustment   Post Interventions Patient Tolerated: Well Instructions Provided: Care of device  Lita Flynn [name] 12/15/2023, 5:52 AM

## 2023-12-15 NOTE — Plan of Care (Signed)

## 2023-12-15 NOTE — Progress Notes (Addendum)
 PROGRESS NOTE    Anita Harrell  FMW:[id number] DOB: [birth year] DOA: 12/14/2023 PCP: Randeen [name], MD    Brief Narrative: Patient lives at home with her husband.  73 year old female she was walking down some stairs at home lost her balance and fell down 3 stairs onto her left side.  She denies LOC, chest pain headaches nausea.  She is on Eliquis  for atrial fibrillation.  Patient was hospitalized a year ago with COVID-19 since then she has been progressively weak and has been utilizing walker and wheelchair or holds onto her husband while she walks. CT head showed no acute findings, age-related atrophic changes noted. CT cervical spine no acute fracture or dislocation. CT chest abdomen and pelvis. Acute mildly displaced fractures of the left superior and inferior pubic rami at the pubic symphysis and the left sacral ala with mild impaction, consistent with a lateral compression type injury. Associated moderate acute interstitial extraperitoneal hemorrhage with mild mass effect on the bladder. No active extravasation .Acute superior endplate fracture of L5 with minimal loss of height. No retropulsion. Multiple healed right rib fractures. No acute bone abnormality within the thorax . CT elbow left Minimally displaced, anatomically aligned intraarticular fractures of the radial head and fracture of the olecranon. No dislocation.Small left elbow effusion and antecubital fossa edema/hemorrhage. No mass or hematoma identified. Moderate degenerative arthritis involving the ulnohumeral and radiocapitellar joints  Reviewed Home medications includes Flexeril , Valium, Norco, Vistaril, melatonin, Paxil , Lasix , Ultram   Assessment & Plan:   Principal Problem:   Multiple closed pelvic fractures without disruption of pelvic circle (HCC) Active Problems:   Chronic heart failure with preserved ejection fraction (HFpEF) (HCC)   Depression with anxiety   Leukocytosis   Paroxysmal atrial fibrillation  (HCC)   Thrombocytopenia   COPD (chronic obstructive pulmonary disease) (HCC)   OSA (obstructive sleep apnea)   Closed fracture of head of left radius   Closed fracture of left olecranon process   Lumbar compression fracture, closed, initial encounter (HCC)    #1 left radial head and olecranon process fracture currently with sling in place.  Received Dilaudid overnight which dropped her saturation.  Patient is currently n.p.o. pending Ortho evaluation.  Eliquis  on hold. Pain control with Tylenol  3 times daily  #2 left pubic rami superior and inferior and sacral ala fractures with hematoma holding Eliquis , await Ortho evaluation.  PT OT and pain control.  #3 acute L5 superior endplate fracture continue pain control Lidocaine  patch Tylenol  and diclofenac gel  #4 leukocytosis no evidence of acute infection. Chest x-ray CT chest no evidence of acute infection UA ketones   #5 history of paroxysmal atrial fibrillation rate controlled on Toprol  and flecainide , holding Eliquis  BP running soft decrease the dose of Toprol .  #6 chronic heart failure with preserved ejection fraction Diuretic on hold since she is n.p.o. and appears dry Slow IV fluids cautiously  #7 anxiety and depression on trazodone  and Cymbalta   #8 history of obstructive sleep apnea and COPD patient uses 2 L of oxygen  at night to sleep and CPAP.  Continue nebulizers/inhaler.  #9 chronic thrombocytopenia follow levels  #10 memory loss patient's husband at bedside reports that she follows up with [facility] neurology for workup of Lewy body dementia. On aricept  #11 status post fall from losing balance likely multifactorial polypharmacy, possible orthostasis, blood pressure 100/53 currently have not done orthostatics since she has pelvic fractures and elbow fractures patient is on multiple medications at home that could interfere with her balance which will need  to be reviewed prior to discharge. She is on Flexeril , Valium,  Norco, Ultram , Vistaril, Paxil , melatonin, Lasix . Will try to minimize these medications on discharge.  Estimated body mass index is 28.79 kg/m as calculated from the following:   Height as of 09/06/23: 5' 5 (1.651 m).   Weight as of 09/06/23: 78.5 kg.  DVT prophylaxis: none for possible surgery and hematoma code Status: Full code  family Communication: Husband at bedside  disposition Plan:  Status is: Observation   Consultants:  Ortho  Procedures: None Antimicrobials: None   Subjective: Patient resting in bed husband at bedside received Dilaudid overnight with some drop in saturation she uses 2 L at home while asleep and has a CPAP waiting to see Ortho.  Objective: Vitals:   12/15/23 0200 12/15/23 0514 12/15/23 0627 12/15/23 0720  BP: 122/89  112/64 (!) 100/53  Pulse: 65  70 74  Resp: 14  18 16   Temp:  98 F (36.7 C) 98.4 F (36.9 C) 97.6 F (36.4 C)  TempSrc:  Oral Oral Oral  SpO2: 100%  97% 95%   No intake or output data in the 24 hours ending 12/15/23 0846 There were no vitals filed for this visit.  Examination:  General exam: Appears in no acute distress  respiratory system: Clear to auscultation. Respiratory effort normal. Cardiovascular system:irreg gastrointestinal system: Abdomen is nondistended, soft and nontender. No organomegaly or masses felt. Normal bowel sounds heard. Central nervous system: Alert and oriented. No focal neurological deficits. Extremities:trace edema   Data Reviewed: I have personally reviewed following labs and imaging studies  CBC: Recent Labs  Lab 12/14/23 2359  WBC 21.1*  HGB 13.9  HCT 41.5  MCV 89.2  PLT 94*   Basic Metabolic Panel: Recent Labs  Lab 12/14/23 2359  NA 137  K 3.5  CL 102  CO2 20*  GLUCOSE 108*  BUN 9  CREATININE 0.72  CALCIUM  9.0   GFR: CrCl cannot be calculated (Unknown ideal weight.). Liver Function Tests: Recent Labs  Lab 12/14/23 2359  AST 25  ALT 28  ALKPHOS 75  BILITOT 0.9  PROT  6.2*  ALBUMIN 3.3*   No results for input(s): LIPASE, AMYLASE in the last 168 hours. No results for input(s): AMMONIA in the last 168 hours. Coagulation Profile: Recent Labs  Lab 12/14/23 2359  INR 1.5*   Cardiac Enzymes: No results for input(s): CKTOTAL, CKMB, CKMBINDEX, TROPONINI in the last 168 hours. BNP (last 3 results) No results for input(s): PROBNP in the last 8760 hours. HbA1C: No results for input(s): HGBA1C in the last 72 hours. CBG: No results for input(s): GLUCAP in the last 168 hours. Lipid Profile: No results for input(s): CHOL, HDL, LDLCALC, TRIG, CHOLHDL, LDLDIRECT in the last 72 hours. Thyroid  Function Tests: No results for input(s): TSH, T4TOTAL, FREET4, T3FREE, THYROIDAB in the last 72 hours. Anemia Panel: No results for input(s): VITAMINB12, FOLATE, FERRITIN, TIBC, IRON, RETICCTPCT in the last 72 hours. Sepsis Labs: No results for input(s): PROCALCITON, LATICACIDVEN in the last 168 hours.  No results found for this or any previous visit (from the past 240 hours).       Radiology Studies: CT Elbow Left Wo Contrast Result Date: 12/15/2023 EXAM: CT Left Upper Extremity, Without IV Contrast TECHNIQUE: Axial images were acquired through the left upper extremity without IV contrast. Reformatted images were reviewed. Automated exposure control, iterative reconstruction, and/or weight-based adjustment of the mA/kV was utilized to reduce the radiation dose to as low as reasonably achievable. COMPARISON: None  available. CLINICAL HISTORY: Elbow trauma. Mechanical fall onto left side. Pt c/o pain in left hip and elbow. Pt hit left side of head. No LOC. FINDINGS: BONES AND JOINTS: The osseous structures are mildly osteopenic. There is a minimally displaced anatomic aligned intraarticular fracture of the radial head best seen on image 53/7 and 80/3 with fracture fragments in anatomic alignment. The articular surface  appears congruent. There is, additionally, a minimally displaced anatomically aligned fracture of the olecranon, best seen on image 43/7 and 80/3. Normal overall alignment. No dislocation. Moderate degenerative arthritis involving the ulnohumeral and radiocapitellar joints. SOFT TISSUES: Small left elbow effusion is present. There is infiltration within the antecubital fossa in keeping with edema and hemorrhage. No mass or hematoma identified. IMPRESSION: 1. Minimally displaced, anatomically aligned intraarticular fractures of the radial head and fracture of the olecranon. No dislocation. 2. Small left elbow effusion and antecubital fossa edema/hemorrhage. No mass or hematoma identified. 3. Moderate degenerative arthritis involving the ulnohumeral and radiocapitellar joints. Electronically signed by: Dorethia Molt MD 12/15/2023 03:02 AM EDT RP Workstation: [id number]   CT CHEST ABDOMEN PELVIS W CONTRAST Result Date: 12/15/2023 EXAM: CT CHEST, ABDOMEN AND PELVIS WITH CONTRAST 12/15/2023 01:57:15 AM TECHNIQUE: CT of the chest, abdomen and pelvis was performed with the administration of intravenous contrast, 75mL (iohexol  (OMNIPAQUE ) 350 MG/ML injection 75 mL IOHEXOL  350 MG/ML SOLN). Multiplanar reformatted images are provided for review. Automated exposure control, iterative reconstruction, and/or weight based adjustment of the mA/kV was utilized to reduce the radiation dose to as low as reasonably achievable. COMPARISON: Comparison examination is January 29, 2023. CLINICAL HISTORY: Polytrauma, blunt. Mechanical fall onto left side. Patient complains of pain in left hip and elbow. Patient hit left side of head. No loss of consciousness. FINDINGS: CHEST: MEDIASTINUM AND LYMPH NODES: Mild coronary artery calcification. Global cardiac size within normal limits. No pericardial effusion. Central pulmonary arteries are of normal caliber. Moderate atherosclerotic calcification within the thoracic aorta. No aortic  aneurysm. Surgical changes of left thyroidectomy identified. Multiple nodules identified within the visual and right thyroid  lobe measuring up to 12 mm which was previously biopsied on November 06, 2023. No follow-up imaging recommended. No pathologic thoracic adenopathy. The esophagus is unremarkable. LUNGS AND PLEURA: Mild emphysema. 7 mm sub solid nodule within the right apex, image 40/5, stable. Stable right pleural thickening. No confluent pulmonary infiltrate. No pneumothorax or pleural effusion. Multiple healed right rib fractures are identified. ABDOMEN AND PELVIS: LIVER: Mild hepatic steatosis. GALLBLADDER AND BILE DUCTS: Cholelithiasis without superimposed pericholecystic inflammatory change. No intra or extrahepatic biliary ductal dilation. SPLEEN: No acute abnormality. PANCREAS: No acute abnormality. ADRENAL GLANDS: No acute abnormality. KIDNEYS, URETERS AND BLADDER: No stones in the kidneys or ureters. No hydronephrosis. No perinephric or periureteral stranding. Urinary bladder is unremarkable, with moderate acute interstitial hemorrhage within the space of retzius adjacent to the pubic symphyseal fractures demonstrating mild mass effect on the bladder. GI AND BOWEL: The stomach, small bowel, and large bowel are otherwise unremarkable. Appendix is normal. Moderate sigmoid diverticulosis. No free intraperitoneal gas or fluid. REPRODUCTIVE ORGANS: Uterus is absent. No adnexal masses. PERITONEUM AND RETROPERITONEUM: No ascites. VASCULATURE: Moderate aortoiliac atherosclerotic calcification. No aortic aneurysm. BONES AND SOFT TISSUES: Acute mildly displaced fractures of the left superior and inferior pubic rami at the pubic symphysis as well as the left sacral ala demonstrate mild impaction in keeping with a lateral compression type injury. Acute superior endplate fracture of L5 with minimal loss of height. No retropulsion. Osseous structures are diffusely osteopenic. Mild  lumbar levoscoliosis. Rotator cuff  scarring. IMPRESSION: 1. Acute mildly displaced fractures of the left superior and inferior pubic rami at the pubic symphysis and the left sacral ala with mild impaction, consistent with a lateral compression type injury. Associated moderate acute interstitial extraperitoneal hemorrhage with mild mass effect on the bladder. No active extravasation. 2. Acute superior endplate fracture of L5 with minimal loss of height. No retropulsion. 3. Multiple healed right rib fractures. No acute bone abnormality within the thorax . 4. Additional incidental findings as noted above. Electronically signed by: Dorethia Molt MD 12/15/2023 02:42 AM EDT RP Workstation: [id number]   CT CERVICAL SPINE WO CONTRAST Result Date: 12/15/2023 CLINICAL DATA:  Status post trauma. EXAM: CT CERVICAL SPINE WITHOUT CONTRAST TECHNIQUE: Multidetector CT imaging of the cervical spine was performed without intravenous contrast. Multiplanar CT image reconstructions were also generated. RADIATION DOSE REDUCTION: This exam was performed according to the departmental dose-optimization program which includes automated exposure control, adjustment of the mA and/or kV according to patient size and/or use of iterative reconstruction technique. COMPARISON:  None Available. FINDINGS: Alignment: There is straightening of the normal cervical spine lordosis. Skull base and vertebrae: No acute fracture. No primary bone lesion or focal pathologic process. Soft tissues and spinal canal: No prevertebral fluid or swelling. No visible canal hematoma. Disc levels: Moderate severity multilevel endplate sclerosis is seen throughout the cervical spine with mild to moderate severity anterior osteophyte formation and posterior bony spurring also present at the levels of C4-C5, C5-C6 and C6-C7. Moderate to marked severity intervertebral disc space narrowing is seen at C6-C7, with mild to moderate severity intervertebral disc space narrowing present throughout the remainder of  the cervical spine. Marked severity bilateral multilevel facet joint hypertrophy is noted. Upper chest: Mild areas of scarring and/or atelectasis are seen within the bilateral apices. Other: Small thyroid  nodules are seen within the right lobe of the thyroid  gland. The thyroid  gland is not identified. IMPRESSION: 1. No acute fracture or subluxation in the cervical spine. 2. Moderate to marked severity multilevel degenerative changes, as described above. Electronically Signed   By: Suzen Dials M.D.   On: 12/15/2023 00:34   CT HEAD WO CONTRAST Result Date: 12/15/2023 CLINICAL DATA:  Status post trauma. EXAM: CT HEAD WITHOUT CONTRAST TECHNIQUE: Contiguous axial images were obtained from the base of the skull through the vertex without intravenous contrast. RADIATION DOSE REDUCTION: This exam was performed according to the departmental dose-optimization program which includes automated exposure control, adjustment of the mA and/or kV according to patient size and/or use of iterative reconstruction technique. COMPARISON:  January 29, 2020 FINDINGS: Brain: There is generalized cerebral atrophy with widening of the extra-axial spaces and ventricular dilatation. There are areas of decreased attenuation within the white matter tracts of the supratentorial brain, consistent with microvascular disease changes. Vascular: No hyperdense vessel or unexpected calcification. Skull: Normal. Negative for fracture or focal lesion. Sinuses/Orbits: No acute finding. Other: None. IMPRESSION: 1. No acute intracranial abnormality. 2. Generalized cerebral atrophy and microvascular disease changes of the supratentorial brain. Electronically Signed   By: Suzen Dials M.D.   On: 12/15/2023 00:29   DG Chest Port 1 View Result Date: 12/15/2023 CLINICAL DATA:  Status post trauma. EXAM: PORTABLE CHEST 1 VIEW COMPARISON:  November 18, 2022 FINDINGS: The heart size and mediastinal contours are within normal limits. Low lung volumes  are noted with mild elevation of the right hemidiaphragm. No acute infiltrate, pleural effusion or pneumothorax is identified. A mildly displaced ninth right rib fracture is  suspected. Degenerative changes are seen involving both shoulders and multiple levels of the thoracic spine. IMPRESSION: 1. Low lung volumes without acute cardiopulmonary disease. 2. Suspected mildly displaced ninth right rib fracture. Correlation with physical examination is recommended to determine the presence of point tenderness. Electronically Signed   By: Suzen Dials M.D.   On: 12/15/2023 00:26   DG Hip Unilat W or Wo Pelvis 2-3 Views Left Result Date: 12/15/2023 CLINICAL DATA:  Status post fall. EXAM: DG HIP (WITH OR WITHOUT PELVIS) 2-3V LEFT COMPARISON:  None Available. FINDINGS: A fracture deformity of indeterminate age is seen involving the symphysis pubis on the left with extension to involve the adjacent portions of the left superior and left inferior pubic rami. There is no evidence of dislocation. Degenerative changes are seen in the form of joint space narrowing and acetabular sclerosis. IMPRESSION: Fracture of the symphysis pubis on the left with extension to involve the adjacent portions of the left superior and left inferior pubic rami. CT correlation is recommended. Electronically Signed   By: Suzen Dials M.D.   On: 12/15/2023 00:21   DG Elbow 2 Views Left Result Date: 12/15/2023 CLINICAL DATA:  Status post trauma. EXAM: LEFT ELBOW - 2 VIEW COMPARISON:  None Available. FINDINGS: The cortical borders of the left radial head and neck are irregular in appearance on the lateral view. This is of indeterminate age. There is no evidence of dislocation. A superficial soft tissue laceration is seen along the medial aspect of the proximal left forearm. IMPRESSION: Findings which may represent a nondisplaced fracture of the left radial head and neck of indeterminate age. Correlation with physical examination is  recommended to determine the presence of point tenderness. Electronically Signed   By: Suzen Dials M.D.   On: 12/15/2023 00:18   DG Knee Left Port Result Date: 12/15/2023 CLINICAL DATA:  Status post fall. EXAM: PORTABLE LEFT KNEE - 1-2 VIEW COMPARISON:  None Available. FINDINGS: No evidence of an acute fracture or dislocation. Marked severity tricompartmental degenerative changes are seen. Soft tissues are unremarkable. IMPRESSION: Marked severity tricompartmental degenerative changes. Electronically Signed   By: Suzen Dials M.D.   On: 12/15/2023 00:15    Scheduled Meds:  budesonide -glycopyrrolate-formoterol   2 puff Inhalation BID   DULoxetine   60 mg Oral BID   flecainide   100 mg Oral BID   metoprolol  succinate  50 mg Oral BID   sodium chloride  flush  3 mL Intravenous Q12H   Continuous Infusions:   LOS: 0 days   Almarie KANDICE Hoots, MD 12/15/2023, 8:46 AM

## 2023-12-15 NOTE — Consult Note (Signed)
 Orthopedic Consultation Note  Current Hospital Day : Hospital Day: 2  Reason For Consult: Left pelvis fractures and left elbow fractures  History of Present Illness:  Anita Harrell is a 73 y.o. female who who had a fall down 3 stairs at home.  She fell onto her left side and presented to the emergency department where she was found to have left pelvis fracture as well as left elbow fractures for which orthopedic surgery was consulted.  Presently she endorses mild left hip pain and some left elbow discomfort.  Of note she is also on Eliquis  for atrial fibrillation  Past Medical History:  Diagnosis Date   Allergy    allergic rhinitis   Anxiety    Arrhythmia    atrial fibrillation   Back pain    Chest pain    CHF (congestive heart failure) (HCC)    COPD (chronic obstructive pulmonary disease) (HCC)    Depression    Epigastric pain    Goiter    History of tobacco abuse    Hyperglycemia    mild   Hyperlipidemia    Hypertension    Insomnia    Hx of   Labile blood pressure    Panic disorder    History of   Personal history of colonic polyps 06/09/2004   SVD (spontaneous vaginal delivery)    x 2    Past Surgical History:  Procedure Laterality Date   ABDOMINAL HYSTERECTOMY N/A 09/24/2012   Procedure: HYSTERECTOMY ABDOMINAL;  Surgeon: Peggye Gull, MD;  Location: [facility];  Service: Gynecology;  Laterality: N/A;   BREAST BIOPSY Right 2016   CARDIOVERSION N/A 02/09/2022   Procedure: CARDIOVERSION;  Surgeon: Perla Evalene JINNY, MD;  Location: [facility];  Service: Cardiovascular;  Laterality: N/A;   CARDIOVERSION N/A 03/02/2022   Procedure: CARDIOVERSION;  Surgeon: Perla Evalene JINNY, MD;  Location: [facility];  Service: Cardiovascular;  Laterality: N/A;   COLONOSCOPY     COLONOSCOPY  2017   ELBOW SURGERY  2004   EYE SURGERY     bilateral lasik   SALPINGOOPHORECTOMY Bilateral 09/24/2012   Procedure: SALPINGO OOPHORECTOMY;  Surgeon: Peggye Gull, MD;  Location: [facility];  Service:  Gynecology;  Laterality: Bilateral;   THYROID  SURGERY     B9 massess   WART FULGURATION N/A 09/24/2012   Procedure: FULGURATION VAGINAL WART;  Surgeon: Peggye Gull, MD;  Location: [facility];  Service: Gynecology;  Laterality: N/A;   WISDOM TOOTH EXTRACTION      Prior to Admission medications   Medication Sig Start Date End Date Taking? Authorizing Provider  acetaminophen  (TYLENOL ) 500 MG tablet Take 500 mg by mouth in the morning and at bedtime.   Yes [provider]  albuterol  (VENTOLIN  HFA) 108 (90 Base) MCG/ACT inhaler Inhale 2 puffs into the lungs every 6 (six) hours as needed for wheezing or shortness of breath. 05/25/23  Yes Tamea Anita CROME, MD  apixaban  (ELIQUIS ) 5 MG TABS tablet Take 5 mg by mouth 2 (two) times daily.   Yes [provider]  cyclobenzaprine  (FLEXERIL ) 10 MG tablet TAKE ONE HALF (1/2) TO ONE TABLET BY MOUTH AT BEDTIME AS NEEDED FOR MUSCLE SPASMS 05/01/23  Yes Tower, Marne A, MD  diazepam (VALIUM) 5 MG tablet Take 5 mg by mouth in the morning and at bedtime. 11/26/23  Yes [provider]  donepezil (ARICEPT) 5 MG tablet Take 5 mg by mouth daily. 12/04/23  Yes [provider]  DULoxetine  (CYMBALTA ) 60 MG capsule Take 120 mg by  mouth daily. 04/11/22  Yes [provider]  flecainide  (TAMBOCOR ) 100 MG tablet Take 1 tablet (100 mg total) by mouth 2 (two) times daily. 12/19/22  Yes Gollan, Timothy J, MD  Fluticasone -Umeclidin-Vilant (TRELEGY ELLIPTA ) 100-62.5-25 MCG/ACT AEPB Inhale 1 puff into the lungs daily.   Yes [provider]  furosemide  (LASIX ) 20 MG tablet TAKE ONE TABLET (20 MG TOTAL) BY MOUTH DAILY. 12/05/23  Yes Gollan, Timothy J, MD  HYDROcodone -acetaminophen  (NORCO/VICODIN) 5-325 MG tablet Take 1 tablet by mouth 3 (three) times daily as needed. 03/08/23  Yes [provider]  hydrOXYzine (VISTARIL) 25 MG capsule Take 25 mg by mouth 2 (two) times daily. 04/11/22  Yes Doles-Johnson, Teah, NP  Melatonin 5 MG CAPS  Take 10 mg by mouth at bedtime.   Yes [provider]  metoprolol  succinate (TOPROL -XL) 50 MG 24 hr tablet TAKE ONE TABLET BY MOUTH TWICE A DAY WITH MEALS 12/19/22  Yes Gollan, Timothy J, MD  PARoxetine  (PAXIL ) 10 MG tablet Take 1 tablet by mouth daily.   Yes Doles-Johnson, Teah, NP  potassium chloride  SA (KLOR-CON  M) 20 MEQ tablet Take 1 tablet (20 mEq total) by mouth daily. Patient taking differently: Take 20 mEq by mouth 2 (two) times daily. 07/31/23  Yes Tower, [name], MD  rosuvastatin  (CRESTOR ) 10 MG tablet TAKE ONE TABLET BY MOUTH ONCE A DAY 10/10/23  Yes Tower, Marne A, MD  traMADol  (ULTRAM ) 50 MG tablet TAKE ONE (1) TO TWO (2) TABLETS BY MOUTH EVERY EIGHT HOURS AS NEEDED FOR MODERATE PAIN 02/13/23  Yes Tower, [name], MD  traZODone  (DESYREL ) 100 MG tablet Take 200 mg by mouth at bedtime. 03/05/23  Yes [provider]    Physical Examination Left Lower Extremity: Skin is intact + ankle dorsiflexion/plantarflexion/EHL SILT SP/DP/T Foot wwp   Left Upper Extremity: Splint in place SILT rad/med/ulnar + motor deltoid/grip/EPL/IO Palpable radial pulse   Imaging: X-rays and CT scan of the left elbow and left pelvis reviewed interpreted demonstrating LC 1 left pelvic fractures involving the pubic rami and sacral ala.  Minimally displaced radial head and olecranon fractures also noticed on the background of diffuse osteopenia in the elbow  Assessment:   Anita Harrell is a 73 y.o. female with left LC 1 pelvis fracture, sacral ala and rami.  Also with left elbow nondisplaced radial head fracture.  Plan:   Okay for weightbearing as tolerated on her lower extremities Nonweightbearing on her left upper extremity with splint. No surgical plans Follow up in 2 weeks for splint removal and repeat radiographs

## 2023-12-15 NOTE — ED Notes (Signed)
 After dilaudid admin pt began to desat, placed patient on 2l Nasal cannula. MD Opyd aware.

## 2023-12-15 NOTE — ED Notes (Addendum)
This nurse transported patient to CT

## 2023-12-15 NOTE — Progress Notes (Signed)
   12/15/23 1958  BiPAP/CPAP/SIPAP  Reason BIPAP/CPAP not in use Non-compliant (Pt states she does not wear CPAP.)

## 2023-12-15 NOTE — ED Notes (Signed)
 Ortho tech at bedside to assist with splinting.

## 2023-12-15 NOTE — Progress Notes (Signed)
 Transition of Care [facility]) - CAGE-AID Screening   Patient Details  Name: Anita Harrell MRN: [id number] Date of Birth: [birth year]   MARINDA LIONEL Sora, RN Phone Number: 12/15/2023, 7:25 AM   Clinical Narrative: Pt denies etoh, drugs, tobacco usage. No resources needed at this time.    CAGE-AID Screening:    Have You Ever Felt You Ought to Cut Down on Your Drinking or Drug Use?: No Have People Annoyed You By Critizing Your Drinking Or Drug Use?: No Have You Felt Bad Or Guilty About Your Drinking Or Drug Use?: No Have You Ever Had a Drink or Used Drugs First Thing In The Morning to Steady Your Nerves or to Get Rid of a Hangover?: No CAGE-AID Score: 0  Substance Abuse Education Offered: No

## 2023-12-16 DIAGNOSIS — S32810A Multiple fractures of pelvis with stable disruption of pelvic ring, initial encounter for closed fracture: Secondary | ICD-10-CM | POA: Diagnosis not present

## 2023-12-16 LAB — BASIC METABOLIC PANEL WITH GFR
Anion gap: 7 (ref 5–15)
BUN: 8 mg/dL (ref 8–23)
CO2: 25 mmol/L (ref 22–32)
Calcium: 8.7 mg/dL — ABNORMAL LOW (ref 8.9–10.3)
Chloride: 107 mmol/L (ref 98–111)
Creatinine, Ser: 0.66 mg/dL (ref 0.44–1.00)
GFR, Estimated: >60 mL/min (ref >60)
Glucose, Bld: 115 mg/dL — ABNORMAL HIGH (ref 70–99)
Potassium: 3.4 mmol/L — ABNORMAL LOW (ref 3.5–5.1)
Sodium: 139 mmol/L (ref 135–145)

## 2023-12-16 LAB — CBC
HCT: 34.5 % — ABNORMAL LOW (ref 36.0–46.0)
Hemoglobin: 11.5 g/dL — ABNORMAL LOW (ref 12.0–15.0)
MCH: 29.8 pg (ref 26.0–34.0)
MCHC: 33.3 g/dL (ref 30.0–36.0)
MCV: 89.4 fL (ref 80.0–100.0)
Platelets: 75 K/uL — ABNORMAL LOW (ref 150–400)
RBC: 3.86 MIL/uL — ABNORMAL LOW (ref 3.87–5.11)
RDW: 12.8 % (ref 11.5–15.5)
WBC: 13.6 K/uL — ABNORMAL HIGH (ref 4.0–10.5)
nRBC: 0 % (ref 0.0–0.2)

## 2023-12-16 MED ORDER — OXYCODONE HCL 5 MG PO TABS
10.0000 mg | ORAL_TABLET | ORAL | Status: DC | PRN
  Administered 2023-12-16 – 2023-12-19 (×10): 10 mg via ORAL
  Filled 2023-12-16 (×10): qty 2

## 2023-12-16 MED ORDER — POTASSIUM CHLORIDE CRYS ER 20 MEQ PO TBCR
40.0000 meq | EXTENDED_RELEASE_TABLET | Freq: Once | ORAL | Status: AC
Start: 2023-12-16 — End: 2023-12-16
  Administered 2023-12-16: 40 meq via ORAL
  Filled 2023-12-16: qty 2

## 2023-12-16 NOTE — Progress Notes (Signed)
   12/16/23 2023  BiPAP/CPAP/SIPAP  Reason BIPAP/CPAP not in use Non-compliant (Pt does not wear CPAP)

## 2023-12-16 NOTE — Progress Notes (Addendum)
 PROGRESS NOTE    Anita Harrell  FMW:[id number] DOB: Dec 03, 1950 DOA: 12/14/2023 PCP: Randeen [name], MD    Brief Narrative: Patient lives at home with her husband.  73 year old female she was walking down some stairs at home lost her balance and fell down 3 stairs onto her left side.  She denies LOC, chest pain headaches nausea.  She is on Eliquis  for atrial fibrillation.  Patient was hospitalized a year ago with COVID-19 since then she has been progressively weak and has been utilizing walker and wheelchair or holds onto her husband while she walks. CT head showed no acute findings, age-related atrophic changes noted. CT cervical spine no acute fracture or dislocation. CT chest abdomen and pelvis. Acute mildly displaced fractures of the left superior and inferior pubic rami at the pubic symphysis and the left sacral ala with mild impaction, consistent with a lateral compression type injury. Associated moderate acute interstitial extraperitoneal hemorrhage with mild mass effect on the bladder. No active extravasation .Acute superior endplate fracture of L5 with minimal loss of height. No retropulsion. Multiple healed right rib fractures. No acute bone abnormality within the thorax . CT elbow left Minimally displaced, anatomically aligned intraarticular fractures of the radial head and fracture of the olecranon. No dislocation.Small left elbow effusion and antecubital fossa edema/hemorrhage. No mass or hematoma identified. Moderate degenerative arthritis involving the ulnohumeral and radiocapitellar joints  Reviewed Home medications includes Flexeril , Valium, Norco, Vistaril, melatonin, Paxil , Lasix , Ultram   Assessment & Plan:   Principal Problem:   Multiple closed pelvic fractures without disruption of pelvic circle (HCC) Active Problems:   Chronic heart failure with preserved ejection fraction (HFpEF) (HCC)   Depression with anxiety   Leukocytosis   Paroxysmal atrial fibrillation  (HCC)   Thrombocytopenia   COPD (chronic obstructive pulmonary disease) (HCC)   OSA (obstructive sleep apnea)   Closed fracture of head of left radius   Closed fracture of left olecranon process   Lumbar compression fracture, closed, initial encounter (HCC)    #1 left radial head and olecranon process fracture currently with sling in place.  Received Dilaudid overnight which dropped her saturation.  Seen by Ortho recommending nonweightbearing to her left upper extremity with splint.  No surgical plans.  Follow-up with Dr. Eldridge [facility] for splint removal and repeat x-rays in 2 weeks. Eliquis  on hold. Pain control with Tylenol  3 times daily  #2 left pubic rami superior and inferior and sacral ala fractures with hematoma holding Eliquis , seen by Ortho weightbearing as tolerated on her lower extremities.  #3 acute L5 superior endplate fracture continue pain control Lidocaine  patch Tylenol  and diclofenac gel  #4 leukocytosis no evidence of acute infection.  Improving. 13.6 from 21.1 without antibiotics.  She was given IV fluids. Chest x-ray CT chest no evidence of acute infection UA ketones   #5 history of paroxysmal atrial fibrillation rate controlled on Toprol  and flecainide , holding Eliquis  BP running soft decrease the dose of Toprol .  #6 chronic heart failure with preserved ejection fraction Diuretic on hold since she is n.p.o. and appears dry Slow IV fluids cautiously  #7 anxiety and depression on trazodone  and Cymbalta   #8 history of obstructive sleep apnea and COPD patient uses 2 L of oxygen  at night to sleep and CPAP.  Continue nebulizers/inhaler.  #9 chronic thrombocytopenia follow levels  #10 memory loss patient's husband at bedside reports that she follows up with [facility] neurology for workup of Lewy body dementia. On aricept  #11 status post fall from losing balance  likely multifactorial polypharmacy, possible orthostasis, blood pressure 100/53 currently have not done  orthostatics since she has pelvic fractures and elbow fractures patient is on multiple medications at home that could interfere with her balance which will need to be reviewed prior to discharge. She is on Flexeril , Valium, Norco, Ultram , Vistaril, Paxil , melatonin, Lasix . Will try to minimize these medications on discharge.  #12 Hypokalemia replete  Estimated body mass index is 29.83 kg/m as calculated from the following:   Height as of 09/06/23: 5' 5 (1.651 m).   Weight as of this encounter: 81.3 kg.  DVT prophylaxis: none for possible surgery and hematoma code Status: Full code  family Communication: Husband at bedside  disposition Plan:  Status is: Observation   Consultants:  Ortho  Procedures: None Antimicrobials: None   Subjective:  Resting in bed husband at bedside On oxycodone  and Tylenol  for pain control Reported she is still complaining of 10 out of 10 pain with movement Oxycodone  increased to 10 mg. Objective: Vitals:   12/15/23 2205 12/16/23 0444 12/16/23 0738 12/16/23 0840  BP: (!) 95/55 (!) 111/53 (!) 131/57   Pulse: 68 64 64   Resp:  18 16   Temp:  97.8 F (36.6 C) 97.7 F (36.5 C)   TempSrc:  Oral Oral   SpO2:  100% 98% 96%  Weight:  81.3 kg      Intake/Output Summary (Last 24 hours) at 12/16/2023 1257 Last data filed at 12/16/2023 0900 Gross per 24 hour  Intake 2366.4 ml  Output 1150 ml  Net 1216.4 ml   Filed Weights   12/16/23 0444  Weight: 81.3 kg    Examination:  General exam: Appears in no acute distress  respiratory system: Clear to auscultation. Respiratory effort normal. Cardiovascular system:irreg gastrointestinal system: Abdomen is nondistended, soft and nontender. No organomegaly or masses felt. Normal bowel sounds heard. Central nervous system: Alert and oriented. No focal neurological deficits. Extremities left upper extremity in splint Data Reviewed: I have personally reviewed following labs and imaging  studies  CBC: Recent Labs  Lab 12/14/23 2359 12/15/23 1011 12/16/23 0307  WBC 21.1* 18.0* 13.6*  HGB 13.9 12.3 11.5*  HCT 41.5 36.7 34.5*  MCV 89.2 88.4 89.4  PLT 94* 76* 75*   Basic Metabolic Panel: Recent Labs  Lab 12/14/23 2359 12/15/23 1011 12/16/23 0307  NA 137 139 139  K 3.5 3.7 3.4*  CL 102 108 107  CO2 20* 22 25  GLUCOSE 108* 98 115*  BUN 9 5* 8  CREATININE 0.72 0.54 0.66  CALCIUM  9.0 8.6* 8.7*   GFR: Estimated Creatinine Clearance: 65 mL/min (by C-G formula based on SCr of 0.66 mg/dL). Liver Function Tests: Recent Labs  Lab 12/14/23 2359  AST 25  ALT 28  ALKPHOS 75  BILITOT 0.9  PROT 6.2*  ALBUMIN 3.3*   No results for input(s): LIPASE, AMYLASE in the last 168 hours. No results for input(s): AMMONIA in the last 168 hours. Coagulation Profile: Recent Labs  Lab 12/14/23 2359  INR 1.5*   Cardiac Enzymes: No results for input(s): CKTOTAL, CKMB, CKMBINDEX, TROPONINI in the last 168 hours. BNP (last 3 results) No results for input(s): PROBNP in the last 8760 hours. HbA1C: No results for input(s): HGBA1C in the last 72 hours. CBG: No results for input(s): GLUCAP in the last 168 hours. Lipid Profile: No results for input(s): CHOL, HDL, LDLCALC, TRIG, CHOLHDL, LDLDIRECT in the last 72 hours. Thyroid  Function Tests: No results for input(s): TSH, T4TOTAL, FREET4, T3FREE, THYROIDAB in  the last 72 hours. Anemia Panel: No results for input(s): VITAMINB12, FOLATE, FERRITIN, TIBC, IRON, RETICCTPCT in the last 72 hours. Sepsis Labs: No results for input(s): PROCALCITON, LATICACIDVEN in the last 168 hours.  No results found for this or any previous visit (from the past 240 hours).       Radiology Studies: CT Elbow Left Wo Contrast Result Date: 12/15/2023 EXAM: CT Left Upper Extremity, Without IV Contrast TECHNIQUE: Axial images were acquired through the left upper extremity without IV contrast.  Reformatted images were reviewed. Automated exposure control, iterative reconstruction, and/or weight-based adjustment of the mA/kV was utilized to reduce the radiation dose to as low as reasonably achievable. COMPARISON: None available. CLINICAL HISTORY: Elbow trauma. Mechanical fall onto left side. Pt c/o pain in left hip and elbow. Pt hit left side of head. No LOC. FINDINGS: BONES AND JOINTS: The osseous structures are mildly osteopenic. There is a minimally displaced anatomic aligned intraarticular fracture of the radial head best seen on image 53/7 and 80/3 with fracture fragments in anatomic alignment. The articular surface appears congruent. There is, additionally, a minimally displaced anatomically aligned fracture of the olecranon, best seen on image 43/7 and 80/3. Normal overall alignment. No dislocation. Moderate degenerative arthritis involving the ulnohumeral and radiocapitellar joints. SOFT TISSUES: Small left elbow effusion is present. There is infiltration within the antecubital fossa in keeping with edema and hemorrhage. No mass or hematoma identified. IMPRESSION: 1. Minimally displaced, anatomically aligned intraarticular fractures of the radial head and fracture of the olecranon. No dislocation. 2. Small left elbow effusion and antecubital fossa edema/hemorrhage. No mass or hematoma identified. 3. Moderate degenerative arthritis involving the ulnohumeral and radiocapitellar joints. Electronically signed by: Dorethia Molt MD 12/15/2023 03:02 AM EDT RP Workstation: [id number]   CT CHEST ABDOMEN PELVIS W CONTRAST Result Date: 12/15/2023 EXAM: CT CHEST, ABDOMEN AND PELVIS WITH CONTRAST 12/15/2023 01:57:15 AM TECHNIQUE: CT of the chest, abdomen and pelvis was performed with the administration of intravenous contrast, 75mL (iohexol  (OMNIPAQUE ) 350 MG/ML injection 75 mL IOHEXOL  350 MG/ML SOLN). Multiplanar reformatted images are provided for review. Automated exposure control, iterative  reconstruction, and/or weight based adjustment of the mA/kV was utilized to reduce the radiation dose to as low as reasonably achievable. COMPARISON: Comparison examination is January 29, 2023. CLINICAL HISTORY: Polytrauma, blunt. Mechanical fall onto left side. Patient complains of pain in left hip and elbow. Patient hit left side of head. No loss of consciousness. FINDINGS: CHEST: MEDIASTINUM AND LYMPH NODES: Mild coronary artery calcification. Global cardiac size within normal limits. No pericardial effusion. Central pulmonary arteries are of normal caliber. Moderate atherosclerotic calcification within the thoracic aorta. No aortic aneurysm. Surgical changes of left thyroidectomy identified. Multiple nodules identified within the visual and right thyroid  lobe measuring up to 12 mm which was previously biopsied on November 06, 2023. No follow-up imaging recommended. No pathologic thoracic adenopathy. The esophagus is unremarkable. LUNGS AND PLEURA: Mild emphysema. 7 mm sub solid nodule within the right apex, image 40/5, stable. Stable right pleural thickening. No confluent pulmonary infiltrate. No pneumothorax or pleural effusion. Multiple healed right rib fractures are identified. ABDOMEN AND PELVIS: LIVER: Mild hepatic steatosis. GALLBLADDER AND BILE DUCTS: Cholelithiasis without superimposed pericholecystic inflammatory change. No intra or extrahepatic biliary ductal dilation. SPLEEN: No acute abnormality. PANCREAS: No acute abnormality. ADRENAL GLANDS: No acute abnormality. KIDNEYS, URETERS AND BLADDER: No stones in the kidneys or ureters. No hydronephrosis. No perinephric or periureteral stranding. Urinary bladder is unremarkable, with moderate acute interstitial hemorrhage within the space of retzius  adjacent to the pubic symphyseal fractures demonstrating mild mass effect on the bladder. GI AND BOWEL: The stomach, small bowel, and large bowel are otherwise unremarkable. Appendix is normal. Moderate sigmoid  diverticulosis. No free intraperitoneal gas or fluid. REPRODUCTIVE ORGANS: Uterus is absent. No adnexal masses. PERITONEUM AND RETROPERITONEUM: No ascites. VASCULATURE: Moderate aortoiliac atherosclerotic calcification. No aortic aneurysm. BONES AND SOFT TISSUES: Acute mildly displaced fractures of the left superior and inferior pubic rami at the pubic symphysis as well as the left sacral ala demonstrate mild impaction in keeping with a lateral compression type injury. Acute superior endplate fracture of L5 with minimal loss of height. No retropulsion. Osseous structures are diffusely osteopenic. Mild lumbar levoscoliosis. Rotator cuff scarring. IMPRESSION: 1. Acute mildly displaced fractures of the left superior and inferior pubic rami at the pubic symphysis and the left sacral ala with mild impaction, consistent with a lateral compression type injury. Associated moderate acute interstitial extraperitoneal hemorrhage with mild mass effect on the bladder. No active extravasation. 2. Acute superior endplate fracture of L5 with minimal loss of height. No retropulsion. 3. Multiple healed right rib fractures. No acute bone abnormality within the thorax . 4. Additional incidental findings as noted above. Electronically signed by: Dorethia Molt MD 12/15/2023 02:42 AM EDT RP Workstation: [id number]   CT CERVICAL SPINE WO CONTRAST Result Date: 12/15/2023 CLINICAL DATA:  Status post trauma. EXAM: CT CERVICAL SPINE WITHOUT CONTRAST TECHNIQUE: Multidetector CT imaging of the cervical spine was performed without intravenous contrast. Multiplanar CT image reconstructions were also generated. RADIATION DOSE REDUCTION: This exam was performed according to the departmental dose-optimization program which includes automated exposure control, adjustment of the mA and/or kV according to patient size and/or use of iterative reconstruction technique. COMPARISON:  None Available. FINDINGS: Alignment: There is straightening of the  normal cervical spine lordosis. Skull base and vertebrae: No acute fracture. No primary bone lesion or focal pathologic process. Soft tissues and spinal canal: No prevertebral fluid or swelling. No visible canal hematoma. Disc levels: Moderate severity multilevel endplate sclerosis is seen throughout the cervical spine with mild to moderate severity anterior osteophyte formation and posterior bony spurring also present at the levels of C4-C5, C5-C6 and C6-C7. Moderate to marked severity intervertebral disc space narrowing is seen at C6-C7, with mild to moderate severity intervertebral disc space narrowing present throughout the remainder of the cervical spine. Marked severity bilateral multilevel facet joint hypertrophy is noted. Upper chest: Mild areas of scarring and/or atelectasis are seen within the bilateral apices. Other: Small thyroid  nodules are seen within the right lobe of the thyroid  gland. The thyroid  gland is not identified. IMPRESSION: 1. No acute fracture or subluxation in the cervical spine. 2. Moderate to marked severity multilevel degenerative changes, as described above. Electronically Signed   By: Suzen Dials M.D.   On: 12/15/2023 00:34   CT HEAD WO CONTRAST Result Date: 12/15/2023 CLINICAL DATA:  Status post trauma. EXAM: CT HEAD WITHOUT CONTRAST TECHNIQUE: Contiguous axial images were obtained from the base of the skull through the vertex without intravenous contrast. RADIATION DOSE REDUCTION: This exam was performed according to the departmental dose-optimization program which includes automated exposure control, adjustment of the mA and/or kV according to patient size and/or use of iterative reconstruction technique. COMPARISON:  January 29, 2020 FINDINGS: Brain: There is generalized cerebral atrophy with widening of the extra-axial spaces and ventricular dilatation. There are areas of decreased attenuation within the white matter tracts of the supratentorial brain, consistent with  microvascular disease changes. Vascular: No hyperdense  vessel or unexpected calcification. Skull: Normal. Negative for fracture or focal lesion. Sinuses/Orbits: No acute finding. Other: None. IMPRESSION: 1. No acute intracranial abnormality. 2. Generalized cerebral atrophy and microvascular disease changes of the supratentorial brain. Electronically Signed   By: Suzen Dials M.D.   On: 12/15/2023 00:29   DG Chest Port 1 View Result Date: 12/15/2023 CLINICAL DATA:  Status post trauma. EXAM: PORTABLE CHEST 1 VIEW COMPARISON:  November 18, 2022 FINDINGS: The heart size and mediastinal contours are within normal limits. Low lung volumes are noted with mild elevation of the right hemidiaphragm. No acute infiltrate, pleural effusion or pneumothorax is identified. A mildly displaced ninth right rib fracture is suspected. Degenerative changes are seen involving both shoulders and multiple levels of the thoracic spine. IMPRESSION: 1. Low lung volumes without acute cardiopulmonary disease. 2. Suspected mildly displaced ninth right rib fracture. Correlation with physical examination is recommended to determine the presence of point tenderness. Electronically Signed   By: Suzen Dials M.D.   On: 12/15/2023 00:26   DG Hip Unilat W or Wo Pelvis 2-3 Views Left Result Date: 12/15/2023 CLINICAL DATA:  Status post fall. EXAM: DG HIP (WITH OR WITHOUT PELVIS) 2-3V LEFT COMPARISON:  None Available. FINDINGS: A fracture deformity of indeterminate age is seen involving the symphysis pubis on the left with extension to involve the adjacent portions of the left superior and left inferior pubic rami. There is no evidence of dislocation. Degenerative changes are seen in the form of joint space narrowing and acetabular sclerosis. IMPRESSION: Fracture of the symphysis pubis on the left with extension to involve the adjacent portions of the left superior and left inferior pubic rami. CT correlation is recommended. Electronically  Signed   By: Suzen Dials M.D.   On: 12/15/2023 00:21   DG Elbow 2 Views Left Result Date: 12/15/2023 CLINICAL DATA:  Status post trauma. EXAM: LEFT ELBOW - 2 VIEW COMPARISON:  None Available. FINDINGS: The cortical borders of the left radial head and neck are irregular in appearance on the lateral view. This is of indeterminate age. There is no evidence of dislocation. A superficial soft tissue laceration is seen along the medial aspect of the proximal left forearm. IMPRESSION: Findings which may represent a nondisplaced fracture of the left radial head and neck of indeterminate age. Correlation with physical examination is recommended to determine the presence of point tenderness. Electronically Signed   By: Suzen Dials M.D.   On: 12/15/2023 00:18   DG Knee Left Port Result Date: 12/15/2023 CLINICAL DATA:  Status post fall. EXAM: PORTABLE LEFT KNEE - 1-2 VIEW COMPARISON:  None Available. FINDINGS: No evidence of an acute fracture or dislocation. Marked severity tricompartmental degenerative changes are seen. Soft tissues are unremarkable. IMPRESSION: Marked severity tricompartmental degenerative changes. Electronically Signed   By: Suzen Dials M.D.   On: 12/15/2023 00:15    Scheduled Meds:  acetaminophen   1,000 mg Oral TID   budesonide -glycopyrrolate-formoterol   2 puff Inhalation BID   diclofenac Sodium  2 g Topical QID   DULoxetine   60 mg Oral BID   flecainide   100 mg Oral BID   lidocaine   1 patch Transdermal Q24H   metoprolol  succinate  12.5 mg Oral BID   sodium chloride  flush  3 mL Intravenous Q12H   Continuous Infusions:   LOS: 1 day   Almarie KANDICE Hoots, MD 12/16/2023, 12:57 PM

## 2023-12-16 NOTE — Evaluation (Signed)
 Physical Therapy Evaluation Patient Details Name: Anita Harrell MRN: [id number] DOB: January 24, 1951 Today's Date: 12/16/2023  History of Present Illness  Patient is a 73 yo female admitted on 12/14/23 s/p fall at home, sustaining  Left radial head and olecranon process fractures, Left pubic rami and sacral ala fractures with hematoma, and L5 superior end plate fracture. Ortho team with nonsurgical treatment. PMH significant for atrial fibrillation, HFpEF, COPD, hypertension, hyperlipidemia presenting with acute respiratory failure with hypoxia, pneumonia, COPD and CHF exacerbations.  Clinical Impression  PTA, patient lived with spouse in two level home and used 2WW for ambulation but reports overall weakness since she last had COVID > 1 year ago. Patient presents today with anxiety, poor pain tolerance, significant impairments in functional mobility, grossly 2+/5 MMT strength B LE, and balance deficits both seated/standing. Patient is currently limited in activity tolerance secondary to pain located at low back, L hip and L UE. Frequent cues throughout session to maintain L UE NWB especially in seated position. Patient emotionally labile throughout session, encouraged that she has participated well today. Patient will continue to benefit from skilled acute PT services. At this time, recommend post-acute rehab < 3 hours/day however, patient's goal is to return home.       If plan is discharge home, recommend the following: Two people to help with bathing/dressing/bathroom;Two people to help with walking and/or transfers;Assistance with cooking/housework;Assist for transportation   Can travel by private vehicle   No    Equipment Recommendations Other (comment) (Will continue to assess)  Recommendations for Other Services       Functional Status Assessment Patient has had a recent decline in their functional status and demonstrates the ability to make significant improvements in function in a  reasonable and predictable amount of time.     Precautions / Restrictions Precautions Precautions: Fall Recall of Precautions/Restrictions: Intact Required Braces or Orthoses:  (Patient has sling in room but was not wearing upon entry.) Restrictions Weight Bearing Restrictions Per Provider Order: Yes LUE Weight Bearing Per Provider Order: Non weight bearing RLE Weight Bearing Per Provider Order: Weight bearing as tolerated LLE Weight Bearing Per Provider Order: Weight bearing as tolerated      Mobility  Bed Mobility Overal bed mobility: Needs Assistance Bed Mobility: Supine to Sit, Sit to Supine, Rolling Rolling: Max assist   Supine to sit: HOB elevated, Used rails, Max assist Sit to supine: +2 for physical assistance, Max assist   General bed mobility comments: maxA for supine to sit but maxA x2 to return to supine. Dependent for supine scooting.    Transfers Overall transfer level: Needs assistance Equipment used: None Transfers: Sit to/from Stand Sit to Stand: Max assist, +2 physical assistance           General transfer comment: maxA x2 from EOB to standing with R UE supported. x2 reps with second attempt, able to stand taller however poor weight tolerance on LLE.    Ambulation/Gait               General Gait Details: unable  Stairs            Wheelchair Mobility     Tilt Bed    Modified Rankin (Stroke Patients Only)       Balance Overall balance assessment: History of Falls, Needs assistance Sitting-balance support: Feet unsupported, Single extremity supported Sitting balance-Leahy Scale: Poor Sitting balance - Comments: min/CGA for seated balance at EOB, patient with significant use of R UE, requiring trunk stability when  using R UE functionally. Also limited due to pain at L hip and choosing to offload in seated position. Frequent cues to avoid use of L UE.   Standing balance support: Bilateral upper extremity supported Standing  balance-Leahy Scale: Poor Standing balance comment: Patient required maxA at all times in static standing position. Poor tolerance < 30 seconds with poor weight acceptance L LE.                             Pertinent Vitals/Pain Pain Assessment Pain Assessment: 0-10 Pain Score: 10-Worst pain ever Pain Location: low back, L hip, L UE Pain Descriptors / Indicators: Constant, Sharp Pain Intervention(s): Monitored during session, Limited activity within patient's tolerance, Repositioned, RN gave pain meds during session    Home Living Family/patient expects to be discharged to:: Private residence Living Arrangements: Spouse/significant other Available Help at Discharge: Family;Available 24 hours/day Type of Home: House         Home Layout: Able to live on main level with bedroom/bathroom;Other (Comment) Home Equipment: Rolling Walker (2 wheels);Cane - single point;Wheelchair - manual Additional Comments: Patient lives in two level home, master bedroom on main floor but kitchen and living room on second floor with flight of steps. Reports she doesn't need to go upstairs as her husband does the cooking.    Prior Function Prior Level of Function : Independent/Modified Independent;History of Falls (last six months)             Mobility Comments: Patient reports independently ambulating with 2WW at home. Would like to progress to cane but reports she feels too weak. She occasionally will ambulate with assist of significant other.       Extremity/Trunk Assessment        Lower Extremity Assessment Lower Extremity Assessment: RLE deficits/detail;LLE deficits/detail RLE Deficits / Details: grossly 2+/5 MMT LLE Deficits / Details: grossly 2+/5 MMT       Communication   Communication Communication: No apparent difficulties    Cognition Arousal: Alert Behavior During Therapy: Anxious   PT - Cognitive impairments: No apparent impairments                          Following commands: Intact       Cueing Cueing Techniques: Verbal cues, Gestural cues, Tactile cues     General Comments General comments (skin integrity, edema, etc.): Seated activity at EOB for 20 minutes, PT encouraging to calm patient's anxiety while CNA assisted for changing gown and self care.    Exercises Other Exercises Other Exercises: Instructed patient to complete frequent ankle pumps, heel slides, and quad sets as tolerated.   Assessment/Plan    PT Assessment Patient needs continued PT services  PT Problem List Decreased strength;Decreased activity tolerance;Decreased balance;Decreased knowledge of precautions;Decreased mobility;Pain       PT Treatment Interventions DME instruction;Gait training;Functional mobility training;Therapeutic activities;Therapeutic exercise;Balance training;Patient/family education;Neuromuscular re-education    PT Goals (Current goals can be found in the Care Plan section)  Acute Rehab PT Goals Patient Stated Goal: Patient's goal is to return home as soon as possible. PT Goal Formulation: With patient Time For Goal Achievement: 12/30/23 Potential to Achieve Goals: Good    Frequency       Co-evaluation               AM-PAC PT 6 Clicks Mobility  Outcome Measure Help needed turning from your back to your side while in a  flat bed without using bedrails?: A Lot Help needed moving from lying on your back to sitting on the side of a flat bed without using bedrails?: A Lot Help needed moving to and from a bed to a chair (including a wheelchair)?: Total Help needed standing up from a chair using your arms (e.g., wheelchair or bedside chair)?: A Lot Help needed to walk in hospital room?: Total Help needed climbing 3-5 steps with a railing? : Total 6 Click Score: 9    End of Session Equipment Utilized During Treatment: Gait belt;Oxygen  Activity Tolerance: Patient limited by pain Patient left: in bed;with SCD's reapplied;with  call bell/phone within reach;with bed alarm set;Other (comment) (L UE elevated on pillow and heels floating) Nurse Communication: Mobility status;Patient requests pain meds PT Visit Diagnosis: Unsteadiness on feet (R26.81);Other abnormalities of gait and mobility (R26.89);History of falling (Z91.81);Muscle weakness (generalized) (M62.81);Pain Pain - Right/Left: Left (Bilateral) Pain - part of body:  (low back, L hip, and L UE)    Time: 8985-8940 PT Time Calculation (min) (ACUTE ONLY): 45 min   Charges:   PT Evaluation $PT Eval Moderate Complexity: 1 Mod PT Treatments $Therapeutic Activity: 23-37 mins PT General Charges $$ ACUTE PT VISIT: 1 Visit         Sherryle [place], PT, DPT [facility] Acute Rehabilitation Office: [phone]   Sherryle VEAR Lula 12/16/2023, 11:23 AM

## 2023-12-17 DIAGNOSIS — S32810A Multiple fractures of pelvis with stable disruption of pelvic ring, initial encounter for closed fracture: Secondary | ICD-10-CM | POA: Diagnosis not present

## 2023-12-17 LAB — CBC
HCT: 34.6 % — ABNORMAL LOW (ref 36.0–46.0)
Hemoglobin: 11.4 g/dL — ABNORMAL LOW (ref 12.0–15.0)
MCH: 29.7 pg (ref 26.0–34.0)
MCHC: 32.9 g/dL (ref 30.0–36.0)
MCV: 90.1 fL (ref 80.0–100.0)
Platelets: 64 K/uL — ABNORMAL LOW (ref 150–400)
RBC: 3.84 MIL/uL — ABNORMAL LOW (ref 3.87–5.11)
RDW: 12.7 % (ref 11.5–15.5)
WBC: 11.2 K/uL — ABNORMAL HIGH (ref 4.0–10.5)
nRBC: 0 % (ref 0.0–0.2)

## 2023-12-17 LAB — BASIC METABOLIC PANEL WITH GFR
Anion gap: 10 (ref 5–15)
BUN: 7 mg/dL — ABNORMAL LOW (ref 8–23)
CO2: 24 mmol/L (ref 22–32)
Calcium: 8.7 mg/dL — ABNORMAL LOW (ref 8.9–10.3)
Chloride: 105 mmol/L (ref 98–111)
Creatinine, Ser: 0.43 mg/dL — ABNORMAL LOW (ref 0.44–1.00)
GFR, Estimated: >60 mL/min (ref >60)
Glucose, Bld: 96 mg/dL (ref 70–99)
Potassium: 3.7 mmol/L (ref 3.5–5.1)
Sodium: 139 mmol/L (ref 135–145)

## 2023-12-17 MED ORDER — ENSURE PLUS HIGH PROTEIN PO LIQD
237.0000 mL | Freq: Two times a day (BID) | ORAL | Status: DC
Start: 2023-12-17 — End: 2023-12-19
  Administered 2023-12-17 – 2023-12-19 (×4): 237 mL via ORAL

## 2023-12-17 MED ORDER — SENNOSIDES-DOCUSATE SODIUM 8.6-50 MG PO TABS
2.0000 | ORAL_TABLET | Freq: Every day | ORAL | Status: DC
  Administered 2023-12-17 – 2023-12-18 (×2): 2 via ORAL
  Filled 2023-12-17 (×2): qty 2

## 2023-12-17 MED ORDER — PAROXETINE HCL 10 MG PO TABS
10.0000 mg | ORAL_TABLET | Freq: Every day | ORAL | Status: DC
  Administered 2023-12-17 – 2023-12-19 (×3): 10 mg via ORAL
  Filled 2023-12-17 (×3): qty 1

## 2023-12-17 MED ORDER — POLYETHYLENE GLYCOL 3350 17 G PO PACK
17.0000 g | PACK | Freq: Every day | ORAL | Status: DC
  Administered 2023-12-17 – 2023-12-19 (×3): 17 g via ORAL
  Filled 2023-12-17 (×3): qty 1

## 2023-12-17 MED ORDER — ROSUVASTATIN CALCIUM 5 MG PO TABS
10.0000 mg | ORAL_TABLET | Freq: Every day | ORAL | Status: DC
  Administered 2023-12-17 – 2023-12-19 (×3): 10 mg via ORAL
  Filled 2023-12-17 (×3): qty 2

## 2023-12-17 NOTE — NC FL2 (Signed)
 [place]  MEDICAID FL2 LEVEL OF CARE FORM     IDENTIFICATION  Patient Name: Anita Harrell Birthdate: [birth year] Sex: female Admission Date (Current Location): 12/14/2023  [facility] and [place] Number:  Producer, television/film/video and Address:  [facility], [address] [place], [place] [address]      Provider Number: [id number]  Attending Physician Name and Address:  Will Almarie MATSU, MD  Relative Name and Phone Number:       Current Level of Care: Hospital Recommended Level of Care: Skilled Nursing Facility Prior Approval Number:    Date Approved/Denied:   PASRR Number: [id number]  Discharge Plan: SNF    Current Diagnoses: Patient Active Problem List   Diagnosis Date Noted   Multiple closed pelvic fractures with disruption of pelvic circle, initial encounter (HCC) 12/16/2023   Multiple closed pelvic fractures without disruption of pelvic circle (HCC) 12/15/2023   Closed fracture of head of left radius 12/15/2023   Closed fracture of left olecranon process 12/15/2023   Lumbar compression fracture, closed, initial encounter (HCC) 12/15/2023   OSA (obstructive sleep apnea) 07/31/2023   Osteopenia 07/31/2023   COPD (chronic obstructive pulmonary disease) (HCC) 04/03/2023   Hypophosphatemia 11/19/2022   Metabolic acidosis 11/19/2022   Chronic heart failure with preserved ejection fraction (HFpEF) (HCC) 11/18/2022   Paroxysmal atrial fibrillation (HCC) 11/18/2022   Thrombocytopenia 11/18/2022   Hallucinations 05/17/2022   Polypharmacy 05/17/2022   General weakness 02/16/2022   Poor balance 02/16/2022   Falling 02/16/2022   Fall at home, initial encounter 02/13/2022   Hypokalemia 02/13/2022   HTN (hypertension) 02/13/2022   DDD (degenerative disc disease), lumbar 12/26/2021   Bilateral hip bursitis 10/05/2021   Atrial fibrillation with RVR (HCC) 10/05/2021   Estrogen deficiency 06/07/2021   Overactive bladder 10/17/2019   Medicare annual  wellness visit, subsequent 01/16/2019   Thyroid  nodule 05/01/2018   Electronic cigarette use 04/03/2018   Obesity (BMI 30-39.9) 04/03/2018   Lung mass 02/01/2018   Smokers' cough (HCC) 01/01/2018   Microscopic hematuria 08/13/2012   Routine general medical examination at a health care facility 07/22/2012   Leukocytosis 03/30/2010   Prediabetes 02/21/2008   Allergic rhinitis 07/25/2007   Low back pain 06/26/2007   Personal history of goiter 07/27/2006   Hyperlipidemia 07/27/2006   PANIC DISORDER 07/27/2006   Former smoker 07/27/2006   Depression with anxiety 07/27/2006   Insomnia 07/27/2006   History of colonic polyps 06/09/2004    Orientation RESPIRATION BLADDER Height & Weight     Time, Situation, Place, Self  Normal Incontinent Weight: 178 lb 9.2 oz (81 kg) Height:     BEHAVIORAL SYMPTOMS/MOOD NEUROLOGICAL BOWEL NUTRITION STATUS      Continent Diet (See DC Summary)  AMBULATORY STATUS COMMUNICATION OF NEEDS Skin   Extensive Assist Verbally Normal                       Personal Care Assistance Level of Assistance  Bathing, Feeding, Dressing Bathing Assistance: Maximum assistance Feeding assistance: Limited assistance Dressing Assistance: Maximum assistance     Functional Limitations Info  Sight, Hearing, Speech Sight Info: Adequate Hearing Info: Adequate Speech Info: Adequate    SPECIAL CARE FACTORS FREQUENCY  PT (By licensed PT), OT (By licensed OT)     PT Frequency: 5x/week OT Frequency: 5x/week            Contractures Contractures Info: Not present    Additional Factors Info  Code Status, Allergies Code Status Info: Full  Allergies Info: Abilify (Aripiprazole); Ambien  (Zolpidem ); Codeine; Lipitor (Atorvastatin); Vesicare  (Solifenacin ); Wellbutrin  (Bupropion )           Current Medications (12/17/2023):  This is the current hospital active medication list Current Facility-Administered Medications  Medication Dose Route Frequency Provider Last  Rate Last Admin   acetaminophen  (TYLENOL ) tablet 650 mg  650 mg Oral Q6H PRN Opyd, Timothy S, MD       Or   acetaminophen  (TYLENOL ) suppository 650 mg  650 mg Rectal Q6H PRN Opyd, Timothy S, MD       acetaminophen  (TYLENOL ) tablet 1,000 mg  1,000 mg Oral TID Will Almarie MATSU, MD   1,000 mg at 12/17/23 1053   budesonide -glycopyrrolate-formoterol  (BREZTRI) 160-9-4.8 MCG/ACT inhaler 2 puff  2 puff Inhalation BID Opyd, Timothy S, MD   2 puff at 12/17/23 1049   diclofenac Sodium (VOLTAREN) 1 % topical gel 2 g  2 g Topical QID Will Almarie MATSU, MD   2 g at 12/17/23 1056   DULoxetine  (CYMBALTA ) DR capsule 60 mg  60 mg Oral BID Opyd, Timothy S, MD   60 mg at 12/17/23 1053   flecainide  (TAMBOCOR ) tablet 100 mg  100 mg Oral BID Opyd, Timothy S, MD   100 mg at 12/16/23 2209   lidocaine  (LIDODERM ) 5 % 1 patch  1 patch Transdermal Q24H Will Almarie MATSU, MD   1 patch at 12/17/23 1051   methocarbamol (ROBAXIN) injection 500 mg  500 mg Intravenous Q6H PRN Opyd, Timothy S, MD   500 mg at 12/16/23 1033   metoprolol  succinate (TOPROL -XL) 24 hr tablet 12.5 mg  12.5 mg Oral BID Will Almarie MATSU, MD   12.5 mg at 12/17/23 1053   oxyCODONE  (Oxy IR/ROXICODONE ) immediate release tablet 10 mg  10 mg Oral Q4H PRN Mathews, Elizabeth G, MD   10 mg at 12/17/23 1022   PARoxetine  (PAXIL ) tablet 10 mg  10 mg Oral Daily Mathews, Elizabeth G, MD   10 mg at 12/17/23 1053   polyethylene glycol (MIRALAX / GLYCOLAX) packet 17 g  17 g Oral Daily Will Almarie MATSU, MD   17 g at 12/17/23 1050   prochlorperazine (COMPAZINE) injection 5 mg  5 mg Intravenous Q6H PRN Opyd, Timothy S, MD       rosuvastatin  (CRESTOR ) tablet 10 mg  10 mg Oral Daily Will Almarie MATSU, MD       senna-docusate (Senokot-S) tablet 2 tablet  2 tablet Oral QHS Will Almarie MATSU, MD       sodium chloride  flush (NS) 0.9 % injection 3 mL  3 mL Intravenous Q12H Opyd, Timothy S, MD   3 mL at 12/16/23 2209   traZODone  (DESYREL ) tablet 100 mg  100 mg  Oral QHS PRN Opyd, Timothy S, MD   100 mg at 12/16/23 2206     Discharge Medications: Please see discharge summary for a list of discharge medications.  Relevant Imaging Results:  Relevant Lab Results:   Additional Information SSN: [id number]  Jeoffrey LITTIE Moose, LCSWA

## 2023-12-17 NOTE — Plan of Care (Signed)
   Problem: Education: Goal: Knowledge of General Education information will improve Description: Including pain rating scale, medication(s)/side effects and non-pharmacologic comfort measures Outcome: Progressing   Problem: Nutrition: Goal: Adequate nutrition will be maintained Outcome: Progressing   Problem: Skin Integrity: Goal: Risk for impaired skin integrity will decrease Outcome: Progressing

## 2023-12-17 NOTE — Progress Notes (Signed)
 PROGRESS NOTE    STORMIE VENTOLA  FMW:[id number] DOB: 03/13/1951 DOA: 12/14/2023 PCP: Randeen [name], MD    Brief Narrative: Patient lives at home with her husband.  73 year old female she was walking down some stairs at home lost her balance and fell down 3 stairs onto her left side.  She denies LOC, chest pain headaches nausea.  She is on Eliquis  for atrial fibrillation.  Patient was hospitalized a year ago with COVID-19 since then she has been progressively weak and has been utilizing walker and wheelchair or holds onto her husband while she walks. CT head showed no acute findings, age-related atrophic changes noted. CT cervical spine no acute fracture or dislocation. CT chest abdomen and pelvis. Acute mildly displaced fractures of the left superior and inferior pubic rami at the pubic symphysis and the left sacral ala with mild impaction, consistent with a lateral compression type injury. Associated moderate acute interstitial extraperitoneal hemorrhage with mild mass effect on the bladder. No active extravasation Acute superior endplate fracture of L5 with minimal loss of height. No retropulsion. Multiple healed right rib fractures. No acute bone abnormality within the thorax . CT elbow left Minimally displaced, anatomically aligned intraarticular fractures of the radial head and fracture of the olecranon. No dislocation.Small left elbow effusion and antecubital fossa edema/hemorrhage. No mass or hematoma identified. Moderate degenerative arthritis involving the ulnohumeral and radiocapitellar joints  Reviewed Home medications includes Flexeril , Valium, Norco, Vistaril, melatonin, Paxil , Lasix , Ultram   Assessment & Plan:   Principal Problem:   Multiple closed pelvic fractures without disruption of pelvic circle (HCC) Active Problems:   Chronic heart failure with preserved ejection fraction (HFpEF) (HCC)   Depression with anxiety   Leukocytosis   Paroxysmal atrial fibrillation  (HCC)   Thrombocytopenia   COPD (chronic obstructive pulmonary disease) (HCC)   OSA (obstructive sleep apnea)   Closed fracture of head of left radius   Closed fracture of left olecranon process   Lumbar compression fracture, closed, initial encounter (HCC)   Multiple closed pelvic fractures with disruption of pelvic circle, initial encounter (HCC)    #1 left radial head and olecranon process fracture currently with sling in place. Seen by Ortho recommending nonweightbearing to her left upper extremity with splint.  No surgical plans.  Follow-up with Dr. Eldridge Ortho for splint removal and repeat x-rays in 2 weeks. Eliquis  on hold. Pain control with Tylenol  3 times daily and oxycodone .  She is very sensitive to Dilaudid it dropped her saturation I have stopped Dilaudid.  #2 left pubic rami superior and inferior and sacral ala fractures with hematoma holding Eliquis , seen by Ortho weightbearing as tolerated on her lower extremities.  Will repeat CT to follow-up on the hematoma 12/17/2023.  Hemoglobin has remained stable.  11.4 from 12.3 probably hemodilution with IV fluids  #3 acute L5 superior endplate fracture continue pain control Lidocaine  patch Tylenol  and diclofenac gel  #4 leukocytosis no evidence of acute infection.  Improving. 11.2 from 13.6 from 21.1 without antibiotics.  She was given IV fluids. Chest x-ray CT chest no evidence of acute infection UA ketones   #5 history of paroxysmal atrial fibrillation rate controlled on Toprol  and flecainide , holding Eliquis  BP running soft decrease the dose of Toprol .  #6 chronic heart failure with preserved ejection fraction Diuretic on hold since she appears dry.  She was given slow IV fluids.  Diuretics still on hold will reassess in AM.  #7 anxiety and depression on trazodone  and Cymbalta   #8 history of obstructive  sleep apnea and COPD patient uses 2 L of oxygen  at night to sleep and CPAP.  Continue nebulizers/inhaler.  #9 chronic  thrombocytopenia slowly trending down 64 from 94 on admission currently not on any anticoagulation or antiplatelets.  #10 memory loss patient's husband at bedside reports that she follows up with [facility] neurology for workup of Lewy body dementia. On aricept  #11 status post fall from losing balance likely multifactorial polypharmacy, possible orthostasis, blood pressure 100/53 currently have not done orthostatics since she has pelvic fractures and elbow fractures patient is on multiple medications at home that could interfere with her balance which will need to be reviewed prior to discharge. She is on Flexeril , Valium, Norco, Ultram , Vistaril, Paxil , melatonin, Lasix . Will try to minimize these medications on discharge.  #12 Hypokalemia repleted  #13 constipation she has not had a BM in 2 days.  I change MiraLAX to standing dose and added senna   Estimated body mass index is 29.72 kg/m as calculated from the following:   Height as of 09/06/23: 5' 5 (1.651 m).   Weight as of this encounter: 81 kg.  DVT prophylaxis: none due to abdominal hematoma code Status: Full code  family Communication: Husband at bedside  disposition Plan:  Status is: Inpatient  consultants:  Ortho  Procedures: None Antimicrobials: None   Subjective: Had a lot of pain sitting up yesterday oxycodone  dose was increased Husband at bedside No BM for 2 days Encouraged to use incentive spirometer  Objective: Vitals:   12/16/23 2021 12/16/23 2128 12/17/23 0346 12/17/23 0500  BP:  (!) 113/51 (!) 119/53   Pulse: 78 75 74   Resp: 16 18 17    Temp:  (!) 97.4 F (36.3 C) 97.8 F (36.6 C)   TempSrc:  Oral Oral   SpO2: 95% 97% 94%   Weight:    81 kg    Intake/Output Summary (Last 24 hours) at 12/17/2023 0908 Last data filed at 12/17/2023 0300 Gross per 24 hour  Intake 240 ml  Output 1200 ml  Net -960 ml   Filed Weights   12/16/23 0444 12/17/23 0500  Weight: 81.3 kg 81 kg    Examination:  General  exam: Appears in no acute distress  respiratory system: Clear to auscultation. Respiratory effort normal. Cardiovascular system:irreg gastrointestinal system: Abdomen is nondistended, soft and nontender. No organomegaly or masses felt. Normal bowel sounds heard. Central nervous system: Alert and oriented. No focal neurological deficits. Extremities left upper extremity in splint Data Reviewed: I have personally reviewed following labs and imaging studies  CBC: Recent Labs  Lab 12/14/23 2359 12/15/23 1011 12/16/23 0307 12/17/23 0609  WBC 21.1* 18.0* 13.6* 11.2*  HGB 13.9 12.3 11.5* 11.4*  HCT 41.5 36.7 34.5* 34.6*  MCV 89.2 88.4 89.4 90.1  PLT 94* 76* 75* 64*   Basic Metabolic Panel: Recent Labs  Lab 12/14/23 2359 12/15/23 1011 12/16/23 0307 12/17/23 0609  NA 137 139 139 139  K 3.5 3.7 3.4* 3.7  CL 102 108 107 105  CO2 20* 22 25 24   GLUCOSE 108* 98 115* 96  BUN 9 5* 8 7*  CREATININE 0.72 0.54 0.66 0.43*  CALCIUM  9.0 8.6* 8.7* 8.7*   GFR: Estimated Creatinine Clearance: 64.9 mL/min (A) (by C-G formula based on SCr of 0.43 mg/dL (L)). Liver Function Tests: Recent Labs  Lab 12/14/23 2359  AST 25  ALT 28  ALKPHOS 75  BILITOT 0.9  PROT 6.2*  ALBUMIN 3.3*   No results for input(s): LIPASE, AMYLASE in  the last 168 hours. No results for input(s): AMMONIA in the last 168 hours. Coagulation Profile: Recent Labs  Lab 12/14/23 2359  INR 1.5*   Cardiac Enzymes: No results for input(s): CKTOTAL, CKMB, CKMBINDEX, TROPONINI in the last 168 hours. BNP (last 3 results) No results for input(s): PROBNP in the last 8760 hours. HbA1C: No results for input(s): HGBA1C in the last 72 hours. CBG: No results for input(s): GLUCAP in the last 168 hours. Lipid Profile: No results for input(s): CHOL, HDL, LDLCALC, TRIG, CHOLHDL, LDLDIRECT in the last 72 hours. Thyroid  Function Tests: No results for input(s): TSH, T4TOTAL, FREET4, T3FREE,  THYROIDAB in the last 72 hours. Anemia Panel: No results for input(s): VITAMINB12, FOLATE, FERRITIN, TIBC, IRON, RETICCTPCT in the last 72 hours. Sepsis Labs: No results for input(s): PROCALCITON, LATICACIDVEN in the last 168 hours.  No results found for this or any previous visit (from the past 240 hours).       Radiology Studies: No results found.   Scheduled Meds:  acetaminophen   1,000 mg Oral TID   budesonide -glycopyrrolate-formoterol   2 puff Inhalation BID   diclofenac Sodium  2 g Topical QID   DULoxetine   60 mg Oral BID   flecainide   100 mg Oral BID   lidocaine   1 patch Transdermal Q24H   metoprolol  succinate  12.5 mg Oral BID   polyethylene glycol  17 g Oral Daily   senna-docusate  2 tablet Oral QHS   sodium chloride  flush  3 mL Intravenous Q12H   Continuous Infusions:   LOS: 2 days   Almarie KANDICE Hoots, MD 12/17/2023, 9:08 AM

## 2023-12-17 NOTE — Progress Notes (Signed)
 Physical Therapy Treatment Patient Details Name: Anita Harrell MRN: [id number] DOB: 05/29/1950 Today's Date: 12/17/2023   History of Present Illness Patient is a 73 yo female admitted on 12/14/23 s/p fall at home, sustaining  Left radial head and olecranon process fractures, Left pubic rami and sacral ala fractures with hematoma, and L5 superior end plate fracture. Ortho team with nonsurgical treatment. PMH significant for atrial fibrillation, HFpEF, COPD, hypertension, hyperlipidemia presenting with acute respiratory failure with hypoxia, pneumonia, COPD and CHF exacerbations.    PT Comments  Pt tolerates treatment well, remaining anxious but reporting less significant pain after premedication. Pt progresses to out of be transfers but continues to require significant physical assistance to do so due to LE weakness and LUE WB restrictions. Patient will benefit from continued inpatient follow up therapy, <3 hours/day.    If plan is discharge home, recommend the following: Two people to help with bathing/dressing/bathroom;Two people to help with walking and/or transfers;Assistance with cooking/housework;Assist for transportation   Can travel by private vehicle     No  Equipment Recommendations  Hospital bed;Wheelchair (measurements PT);Wheelchair cushion (measurements PT);Other (comment) (hoyer lift)    Recommendations for Other Services       Precautions / Restrictions Precautions Precautions: Fall Recall of Precautions/Restrictions: Impaired Precaution/Restrictions Comments: requires frequent cueing to maintain LUE WB restrictions Restrictions Weight Bearing Restrictions Per Provider Order: Yes LUE Weight Bearing Per Provider Order: Non weight bearing RLE Weight Bearing Per Provider Order: Weight bearing as tolerated LLE Weight Bearing Per Provider Order: Weight bearing as tolerated     Mobility  Bed Mobility Overal bed mobility: Needs Assistance Bed Mobility: Supine to Sit      Supine to sit: Mod assist, HOB elevated, Used rails          Transfers Overall transfer level: Needs assistance Equipment used: 1 person hand held assist Transfers: Sit to/from Stand, Bed to chair/wheelchair/BSC Sit to Stand: Mod assist Stand pivot transfers: Max assist         General transfer comment: pt practiced standing with PT physical assistance, requires maxA to pivot as she is unable to step at this time. Pt then stands 2 more times in the [facility]    Ambulation/Gait                   Stairs             Wheelchair Mobility     Tilt Bed    Modified Rankin (Stroke Patients Only)       Balance Overall balance assessment: Needs assistance Sitting-balance support: No upper extremity supported, Feet supported Sitting balance-Leahy Scale: Fair     Standing balance support: Single extremity supported, Reliant on assistive device for balance Standing balance-Leahy Scale: Poor Standing balance comment: mod-maxA                            Communication Communication Communication: No apparent difficulties  Cognition Arousal: Alert Behavior During Therapy: Anxious   PT - Cognitive impairments: Safety/Judgement                         Following commands: Intact      Cueing Cueing Techniques: Verbal cues  Exercises      General Comments General comments (skin integrity, edema, etc.): VSS on 2L Lodge      Pertinent Vitals/Pain Pain Assessment Pain Assessment: Faces Faces Pain Scale: Hurts even more Pain Location: low back  Pain Descriptors / Indicators: Grimacing Pain Intervention(s): Premedicated before session    Home Living                          Prior Function            PT Goals (current goals can now be found in the care plan section) Acute Rehab PT Goals Patient Stated Goal: Patient's goal is to return home as soon as possible. Progress towards PT goals: Progressing toward goals     Frequency    Min 3X/week      PT Plan      Co-evaluation              AM-PAC PT 6 Clicks Mobility   Outcome Measure  Help needed turning from your back to your side while in a flat bed without using bedrails?: A Lot Help needed moving from lying on your back to sitting on the side of a flat bed without using bedrails?: A Lot Help needed moving to and from a bed to a chair (including a wheelchair)?: A Lot Help needed standing up from a chair using your arms (e.g., wheelchair or bedside chair)?: A Lot Help needed to walk in hospital room?: Total Help needed climbing 3-5 steps with a railing? : Total 6 Click Score: 10    End of Session Equipment Utilized During Treatment: Gait belt;Oxygen  Activity Tolerance: Patient limited by pain Patient left: in chair;with call bell/phone within reach;with family/visitor present Nurse Communication: Mobility status;Need for lift equipment PT Visit Diagnosis: Unsteadiness on feet (R26.81);Other abnormalities of gait and mobility (R26.89);History of falling (Z91.81);Muscle weakness (generalized) (M62.81);Pain Pain - Right/Left: Left Pain - part of body: Arm;Shoulder     Time: 8947-8877 PT Time Calculation (min) (ACUTE ONLY): 30 min  Charges:    $Therapeutic Activity: 23-37 mins PT General Charges $$ ACUTE PT VISIT: 1 Visit                     Bernardino JINNY Ruth, PT, DPT Acute Rehabilitation Office [phone]    Bernardino JINNY Ruth 12/17/2023, 11:57 AM

## 2023-12-17 NOTE — TOC Progression Note (Signed)
 Transition of Care (TOC) - Progression Note   Was told patient prefers to go home at discharge.   NCM went to speak to patient and husband at bedside. They informed NCM that they agreed to rehab at a SNF and SW is looking for a bed. Their first choice is [facility]. NCM called SW  Patient Details  Name: Anita Harrell MRN: [id number] Date of Birth: 1951/01/23  Transition of Care [facility]) CM/SW Contact  Sylar Voong, Powell Jansky, RN Phone Number: 12/17/2023, 12:33 PM  Clinical Narrative:         Barriers to Discharge: Continued Medical Work up               Expected Discharge Plan and Services       Living arrangements for the past 2 months: Single Family Home                                       Social Drivers of Health (SDOH) Interventions SDOH Screenings   Food Insecurity: No Food Insecurity (12/16/2023)  Housing: Unknown (12/16/2023)  Transportation Needs: No Transportation Needs (12/16/2023)  Utilities: Not At Risk (12/16/2023)  Alcohol Screen: Low Risk  (07/27/2023)  Depression (PHQ2-9): High Risk (07/27/2023)  Financial Resource Strain: Low Risk  (09/19/2023)   Received from [facility]  Physical Activity: Inactive (07/27/2023)  Social Connections: Moderately Isolated (12/16/2023)  Stress: No Stress Concern Present (07/27/2023)  Tobacco Use: Medium Risk (12/15/2023)  Health Literacy: Adequate Health Literacy (07/27/2023)    Readmission Risk Interventions     No data to display

## 2023-12-17 NOTE — TOC Initial Note (Addendum)
 Transition of Care [facility]) - Initial/Assessment Note    Patient Details  Name: Anita Harrell MRN: [id number] Date of Birth: 1950-12-04  Transition of Care [facility]) CM/SW Contact:    Jeoffrey LITTIE Moose, [place] Phone Number: 12/17/2023, 10:53 AM  Clinical Narrative:                 Pt admitted from home with spouse due to mechanical fall. CSW completed SNF workup with pt and her spouse, [name], at bedside. Pt stated she did not want to go to a facility and would prefer to go home with home health. CSW updated RNCM.  11:57 AM-CSW received call from pt spouse, [name]. [name] stated pt is considering SNF but still undecided and asked if CSW could go ahead and send out referrals. CSW will complete Fl2 and send out SNF referrals. CSW will continue to follow.    Barriers to Discharge: Continued Medical Work up   Patient Goals and CMS Choice Patient states their goals for this hospitalization and ongoing recovery are:: Home Health          Expected Discharge Plan and Services       Living arrangements for the past 2 months: Single Family Home                                      Prior Living Arrangements/Services Living arrangements for the past 2 months: Single Family Home Lives with:: Spouse Patient language and need for interpreter reviewed:: Yes Do you feel safe going back to the place where you live?: Yes      Need for Family Participation in Patient Care: No (Comment) Care giver support system in place?: Yes (comment) Current home services: DME (walker, wheelchair adapt) Criminal Activity/Legal Involvement Pertinent to Current Situation/Hospitalization: No - Comment as needed  Activities of Daily Living      Permission Sought/Granted Permission sought to share information with : Family Supports    Share Information with NAME: [name]     Permission granted to share info w Relationship: Spouse  Permission granted to share info w Contact Information:  [phone]  Emotional Assessment Appearance:: Appears stated age Attitude/Demeanor/Rapport: Engaged Affect (typically observed): Calm Orientation: : Oriented to Place, Oriented to Self, Oriented to  Time, Oriented to Situation Alcohol / Substance Use: Not Applicable Psych Involvement: No (comment)  Admission diagnosis:  Multiple closed pelvic fractures without disruption of pelvic circle (HCC) [S32.82XA] Patient Active Problem List   Diagnosis Date Noted   Multiple closed pelvic fractures with disruption of pelvic circle, initial encounter (HCC) 12/16/2023   Multiple closed pelvic fractures without disruption of pelvic circle (HCC) 12/15/2023   Closed fracture of head of left radius 12/15/2023   Closed fracture of left olecranon process 12/15/2023   Lumbar compression fracture, closed, initial encounter (HCC) 12/15/2023   OSA (obstructive sleep apnea) 07/31/2023   Osteopenia 07/31/2023   COPD (chronic obstructive pulmonary disease) (HCC) 04/03/2023   Hypophosphatemia 11/19/2022   Metabolic acidosis 11/19/2022   Chronic heart failure with preserved ejection fraction (HFpEF) (HCC) 11/18/2022   Paroxysmal atrial fibrillation (HCC) 11/18/2022   Thrombocytopenia 11/18/2022   Hallucinations 05/17/2022   Polypharmacy 05/17/2022   General weakness 02/16/2022   Poor balance 02/16/2022   Falling 02/16/2022   Fall at home, initial encounter 02/13/2022   Hypokalemia 02/13/2022   HTN (hypertension) 02/13/2022   DDD (degenerative disc disease), lumbar 12/26/2021   Bilateral hip bursitis 10/05/2021  Atrial fibrillation with RVR (HCC) 10/05/2021   Estrogen deficiency 06/07/2021   Overactive bladder 10/17/2019   Medicare annual wellness visit, subsequent 01/16/2019   Thyroid  nodule 05/01/2018   Electronic cigarette use 04/03/2018   Obesity (BMI 30-39.9) 04/03/2018   Lung mass 02/01/2018   Smokers' cough (HCC) 01/01/2018   Microscopic hematuria 08/13/2012   Routine general medical  examination at a health care facility 07/22/2012   Leukocytosis 03/30/2010   Prediabetes 02/21/2008   Allergic rhinitis 07/25/2007   Low back pain 06/26/2007   Personal history of goiter 07/27/2006   Hyperlipidemia 07/27/2006   PANIC DISORDER 07/27/2006   Former smoker 07/27/2006   Depression with anxiety 07/27/2006   Insomnia 07/27/2006   History of colonic polyps 06/09/2004   PCP:  Randeen [name], MD Pharmacy:   [facility] - [place], [place] - [address] [place] [address] Phone: [phone] Fax: [phone]  [id number] - [place], [place] - [address] AT [facility] [address] [facility] [address] [place] [address] Phone: [phone] Fax: [phone]     Social Drivers of Health (SDOH) Social History: SDOH Screenings   Food Insecurity: No Food Insecurity (12/16/2023)  Housing: Unknown (12/16/2023)  Transportation Needs: No Transportation Needs (12/16/2023)  Utilities: Not At Risk (12/16/2023)  Alcohol Screen: Low Risk  (07/27/2023)  Depression (PHQ2-9): High Risk (07/27/2023)  Financial Resource Strain: Low Risk  (09/19/2023)   Received from [facility]  Physical Activity: Inactive (07/27/2023)  Social Connections: Moderately Isolated (12/16/2023)  Stress: No Stress Concern Present (07/27/2023)  Tobacco Use: Medium Risk (12/15/2023)  Health Literacy: Adequate Health Literacy (07/27/2023)   SDOH Interventions:     Readmission Risk Interventions     No data to display

## 2023-12-17 NOTE — Progress Notes (Signed)
   12/17/23 2100  BiPAP/CPAP/SIPAP  Reason BIPAP/CPAP not in use Non-compliant (Pt does not wear CPAP. Order removed)  BiPAP/CPAP /SiPAP Vitals  Pulse Rate 71  Resp 16  SpO2 94 %  Bilateral Breath Sounds Diminished  MEWS Score/Color  MEWS Score 0  MEWS Score Color Landy

## 2023-12-18 LAB — BASIC METABOLIC PANEL WITH GFR
Anion gap: 8 (ref 5–15)
BUN: 6 mg/dL — ABNORMAL LOW (ref 8–23)
CO2: 30 mmol/L (ref 22–32)
Calcium: 9.1 mg/dL (ref 8.9–10.3)
Chloride: 104 mmol/L (ref 98–111)
Creatinine, Ser: 0.54 mg/dL (ref 0.44–1.00)
GFR, Estimated: >60 mL/min (ref >60)
Glucose, Bld: 101 mg/dL — ABNORMAL HIGH (ref 70–99)
Potassium: 3.8 mmol/L (ref 3.5–5.1)
Sodium: 142 mmol/L (ref 135–145)

## 2023-12-18 LAB — CBC
HCT: 34.2 % — ABNORMAL LOW (ref 36.0–46.0)
Hemoglobin: 11.4 g/dL — ABNORMAL LOW (ref 12.0–15.0)
MCH: 29.8 pg (ref 26.0–34.0)
MCHC: 33.3 g/dL (ref 30.0–36.0)
MCV: 89.3 fL (ref 80.0–100.0)
Platelets: 70 K/uL — ABNORMAL LOW (ref 150–400)
RBC: 3.83 MIL/uL — ABNORMAL LOW (ref 3.87–5.11)
RDW: 12.7 % (ref 11.5–15.5)
WBC: 11.2 K/uL — ABNORMAL HIGH (ref 4.0–10.5)
nRBC: 0 % (ref 0.0–0.2)

## 2023-12-18 MED ORDER — APIXABAN 5 MG PO TABS
5.0000 mg | ORAL_TABLET | Freq: Two times a day (BID) | ORAL | Status: DC
  Administered 2023-12-18 – 2023-12-19 (×3): 5 mg via ORAL
  Filled 2023-12-18 (×3): qty 1

## 2023-12-18 NOTE — Plan of Care (Signed)
 Pt tearful earlier in the shift stating she was just starting to feel normal and gain strength after she was diagnosed with covid last year. Pt states this debilitating feeling is overwhelming at times. Provided emotional support and active listening. Spiritual consult placed to provide emotional support.    Problem: Education: Goal: Knowledge of General Education information will improve Description: Including pain rating scale, medication(s)/side effects and non-pharmacologic comfort measures Outcome: Progressing   Problem: Health Behavior/Discharge Planning: Goal: Ability to manage health-related needs will improve Outcome: Progressing   Problem: Clinical Measurements: Goal: Ability to maintain clinical measurements within normal limits will improve Outcome: Progressing Goal: Will remain free from infection Outcome: Progressing Goal: Diagnostic test results will improve Outcome: Progressing Goal: Respiratory complications will improve Outcome: Progressing Goal: Cardiovascular complication will be avoided Outcome: Progressing   Problem: Activity: Goal: Risk for activity intolerance will decrease Outcome: Progressing   Problem: Nutrition: Goal: Adequate nutrition will be maintained Outcome: Progressing   Problem: Coping: Goal: Level of anxiety will decrease Outcome: Progressing   Problem: Elimination: Goal: Will not experience complications related to bowel motility Outcome: Progressing Goal: Will not experience complications related to urinary retention Outcome: Progressing   Problem: Pain Managment: Goal: General experience of comfort will improve and/or be controlled Outcome: Progressing   Problem: Safety: Goal: Ability to remain free from injury will improve Outcome: Progressing   Problem: Skin Integrity: Goal: Risk for impaired skin integrity will decrease Outcome: Progressing

## 2023-12-18 NOTE — TOC Progression Note (Signed)
 Transition of Care [facility]) - Progression Note    Patient Details  Name: Anita Harrell MRN: [id number] Date of Birth: 1950/07/13  Transition of Care [facility]) CM/SW Contact  Rigdon Macomber LITTIE Moose, [place] Phone Number: 12/18/2023, 12:05 PM  Clinical Narrative:    CSW provided Pt and her husband, [name] with bed offers at bedside. Pt chose [facility]. CSW confirmed bed availability with facility, pt can admit to facility tomorrow 10/7 if medically stable.      Barriers to Discharge: Continued Medical Work up               Expected Discharge Plan and Services       Living arrangements for the past 2 months: Single Family Home                                       Social Drivers of Health (SDOH) Interventions SDOH Screenings   Food Insecurity: No Food Insecurity (12/16/2023)  Housing: Unknown (12/16/2023)  Transportation Needs: No Transportation Needs (12/16/2023)  Utilities: Not At Risk (12/16/2023)  Alcohol Screen: Low Risk  (07/27/2023)  Depression (PHQ2-9): High Risk (07/27/2023)  Financial Resource Strain: Low Risk  (09/19/2023)   Received from [facility]  Physical Activity: Inactive (07/27/2023)  Social Connections: Moderately Isolated (12/16/2023)  Stress: No Stress Concern Present (07/27/2023)  Tobacco Use: Medium Risk (12/15/2023)  Health Literacy: Adequate Health Literacy (07/27/2023)    Readmission Risk Interventions     No data to display
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
## 2023-12-18 NOTE — Plan of Care (Signed)
  Problem: Education: Goal: Knowledge of General Education information will improve Description: Including pain rating scale, medication(s)/side effects and non-pharmacologic comfort measures 12/18/2023 2031 by Shunna Mikaelian K, RN Outcome: Progressing 12/18/2023 2030 by Caedyn Raygoza K, RN Outcome: Progressing   Problem: Health Behavior/Discharge Planning: Goal: Ability to manage health-related needs will improve 12/18/2023 2031 by Chasidy Janak K, RN Outcome: Progressing 12/18/2023 2030 by Kanitra Purifoy K, RN Outcome: Progressing   Problem: Clinical Measurements: Goal: Ability to maintain clinical measurements within normal limits will improve 12/18/2023 2031 by Crystalmarie Yasin K, RN Outcome: Progressing 12/18/2023 2030 by Yides Saidi K, RN Outcome: Progressing Goal: Will remain free from infection 12/18/2023 2031 by Florian Dellis POUR, RN Outcome: Progressing 12/18/2023 2030 by Velencia Lenart K, RN Outcome: Progressing Goal: Diagnostic test results will improve 12/18/2023 2031 by Inez Rosato K, RN Outcome: Progressing 12/18/2023 2030 by Afiya Ferrebee K, RN Outcome: Progressing Goal: Respiratory complications will improve 12/18/2023 2031 by Shunda Rabadi K, RN Outcome: Progressing 12/18/2023 2030 by Nivia Gervase K, RN Outcome: Progressing Goal: Cardiovascular complication will be avoided 12/18/2023 2031 by Kj Imbert K, RN Outcome: Progressing 12/18/2023 2030 by [place] Merida K, RN Outcome: Progressing   Problem: Activity: Goal: Risk for activity intolerance will decrease 12/18/2023 2031 by Chayden Garrelts K, RN Outcome: Progressing 12/18/2023 2030 by Florian Dellis POUR, RN Outcome: Progressing   Problem: Nutrition: Goal: Adequate nutrition will be maintained 12/18/2023 2031 by Florian Dellis POUR, RN Outcome: Progressing 12/18/2023 2030 by Sanjiv Castorena K, RN Outcome: Progressing   Problem: Coping: Goal: Level of anxiety will decrease 12/18/2023 2031 by Florian Dellis POUR, RN Outcome: Progressing 12/18/2023 2030 by Alayne Estrella K, RN Outcome: Progressing   Problem: Elimination: Goal: Will not experience complications related to bowel motility 12/18/2023 2031 by Maximiliano Cromartie K, RN Outcome: Progressing 12/18/2023 2030 by Devean Skoczylas K, RN Outcome: Progressing Goal: Will not experience complications related to urinary retention 12/18/2023 2031 by Florian Dellis POUR, RN Outcome: Progressing 12/18/2023 2030 by Sage Kopera K, RN Outcome: Progressing   Problem: Pain Managment: Goal: General experience of comfort will improve and/or be controlled 12/18/2023 2031 by [place] Souter K, RN Outcome: Progressing 12/18/2023 2030 by Loreto Loescher K, RN Outcome: Progressing   Problem: Safety: Goal: Ability to remain free from injury will improve 12/18/2023 2031 by Darryl Willner K, RN Outcome: Progressing 12/18/2023 2030 by Valen Gillison K, RN Outcome: Progressing   Problem: Skin Integrity: Goal: Risk for impaired skin integrity will decrease 12/18/2023 2031 by Sheena Donegan K, RN Outcome: Progressing 12/18/2023 2030 by Arasely Akkerman K, RN Outcome: Progressing

## 2023-12-18 NOTE — Progress Notes (Signed)
 Physical Therapy Treatment Patient Details Name: Anita Harrell MRN: [id number] DOB: 1950/06/04 Today's Date: 12/18/2023   History of Present Illness Patient is a 73 yo female admitted on 12/14/23 s/p fall at home, sustaining  Left radial head and olecranon process fractures, Left pubic rami and sacral ala fractures with hematoma, and L5 superior end plate fracture. Ortho team with nonsurgical treatment. PMH significant for atrial fibrillation, HFpEF, COPD, hypertension, hyperlipidemia presenting with acute respiratory failure with hypoxia, pneumonia, COPD and CHF exacerbations.    PT Comments  Pt resting in bed on arrival, pleasant and agreeable to session and demonstrating steady progress towards acute goals. Pt continues to be limited by weight bearing precautions, LE weakness and pain, poor balance/postural reactions and decreased activity tolerance. Pt requiring mod A to come to sitting EOB with strong R lateral lean needing dense verbal and tactile cues to correct. Pt able to come to stand and pivot to chair on R with max A with support at RUE. Pt continues to require cues throughout mobility to maintain NWB on LUE. Pt continues to benefit from skilled PT services to progress toward functional mobility goals.     If plan is discharge home, recommend the following: Two people to help with bathing/dressing/bathroom;Two people to help with walking and/or transfers;Assistance with cooking/housework;Assist for transportation   Can travel by private vehicle     No  Equipment Recommendations  Hospital bed;Wheelchair (measurements PT);Wheelchair cushion (measurements PT);Other (comment) (hoyer lift)    Recommendations for Other Services       Precautions / Restrictions Precautions Precautions: Fall Recall of Precautions/Restrictions: Impaired Precaution/Restrictions Comments: requires frequent cueing to maintain LUE WB restrictions Required Braces or Orthoses: Sling Restrictions Weight  Bearing Restrictions Per Provider Order: Yes LUE Weight Bearing Per Provider Order: Non weight bearing RLE Weight Bearing Per Provider Order: Weight bearing as tolerated LLE Weight Bearing Per Provider Order: Weight bearing as tolerated     Mobility  Bed Mobility Overal bed mobility: Needs Assistance Bed Mobility: Supine to Sit     Supine to sit: Mod assist, HOB elevated, Used rails     General bed mobility comments: mod A to sequece LEs to and off EOB and to eleavte trunk, cues throughout to maintain NWB on LUE    Transfers Overall transfer level: Needs assistance Equipment used: 1 person hand held assist Transfers: Sit to/from Stand, Bed to chair/wheelchair/BSC Sit to Stand: Max assist Stand pivot transfers: Max assist         General transfer comment: pt needing max A to boost to stand from EOB at lowest height, pt blocking LEs at EOB to self steady, pt unable to step LEs needing max A to maintain balance to pivot to chair on R    Ambulation/Gait               General Gait Details: unable   Stairs             Wheelchair Mobility     Tilt Bed    Modified Rankin (Stroke Patients Only)       Balance Overall balance assessment: Needs assistance Sitting-balance support: No upper extremity supported, Feet supported Sitting balance-Leahy Scale: Fair Sitting balance - Comments: min/CGA for seated balance at EOB, patient with significant use of R UE, requiring trunk stability when using R UE functionally. Also limited due to pain at L hip and choosing to offload in seated position. Frequent cues to avoid use of L UE.   Standing balance support: Single extremity  supported, Reliant on assistive device for balance Standing balance-Leahy Scale: Poor Standing balance comment: mod-maxA                            Communication Communication Communication: No apparent difficulties  Cognition Arousal: Alert Behavior During Therapy: Anxious   PT  - Cognitive impairments: Safety/Judgement                         Following commands: Intact      Cueing Cueing Techniques: Verbal cues  Exercises General Exercises - Lower Extremity Ankle Circles/Pumps: AROM, Both, 20 reps, Seated Long Arc Quad: AROM, AAROM, Right, Left, 10 reps, Seated Hip Flexion/Marching: AROM, AAROM, Right, Left, 10 reps, Seated    General Comments General comments (skin integrity, edema, etc.): VSS on supplemental O2      Pertinent Vitals/Pain Pain Assessment Pain Assessment: Faces Faces Pain Scale: Hurts even more Pain Location: BLEs with weight bearing Pain Descriptors / Indicators: Grimacing Pain Intervention(s): Monitored during session, Limited activity within patient's tolerance    Home Living                          Prior Function            PT Goals (current goals can now be found in the care plan section) Acute Rehab PT Goals PT Goal Formulation: With patient Time For Goal Achievement: 12/30/23 Progress towards PT goals: Progressing toward goals    Frequency    Min 3X/week      PT Plan      Co-evaluation              AM-PAC PT 6 Clicks Mobility   Outcome Measure  Help needed turning from your back to your side while in a flat bed without using bedrails?: A Lot Help needed moving from lying on your back to sitting on the side of a flat bed without using bedrails?: A Lot Help needed moving to and from a bed to a chair (including a wheelchair)?: A Lot Help needed standing up from a chair using your arms (e.g., wheelchair or bedside chair)?: A Lot Help needed to walk in hospital room?: Total Help needed climbing 3-5 steps with a railing? : Total 6 Click Score: 10    End of Session Equipment Utilized During Treatment: Gait belt;Oxygen  Activity Tolerance: Patient limited by pain Patient left: in chair;with call bell/phone within reach;with family/visitor present Nurse Communication: Mobility  status;Need for lift equipment (+2 stedy for back to bed) PT Visit Diagnosis: Unsteadiness on feet (R26.81);Other abnormalities of gait and mobility (R26.89);History of falling (Z91.81);Muscle weakness (generalized) (M62.81);Pain Pain - Right/Left: Left Pain - part of body: Arm;Shoulder     Time: 8760-8697 PT Time Calculation (min) (ACUTE ONLY): 23 min  Charges:    $Therapeutic Activity: 23-37 mins PT General Charges $$ ACUTE PT VISIT: 1 Visit                     Anita Maharaj R. PTA Acute Rehabilitation Services Office: [phone]   Anita Harrell 12/18/2023, 3:01 PM

## 2023-12-18 NOTE — Plan of Care (Signed)

## 2023-12-18 NOTE — Care Management Important Message (Signed)
 Important Message  Patient Details  Name: Anita Harrell MRN: [id number] Date of Birth: Oct 04, 1950   Important Message Given:  Yes - Medicare IM     Jon Cruel 12/18/2023, 3:12 PM

## 2023-12-19 DIAGNOSIS — J440 Chronic obstructive pulmonary disease with acute lower respiratory infection: Secondary | ICD-10-CM | POA: Diagnosis not present

## 2023-12-19 DIAGNOSIS — S3282XD Multiple fractures of pelvis without disruption of pelvic ring, subsequent encounter for fracture with routine healing: Secondary | ICD-10-CM | POA: Diagnosis not present

## 2023-12-19 DIAGNOSIS — S32592A Other specified fracture of left pubis, initial encounter for closed fracture: Secondary | ICD-10-CM | POA: Diagnosis not present

## 2023-12-19 DIAGNOSIS — S32509D Unspecified fracture of unspecified pubis, subsequent encounter for fracture with routine healing: Secondary | ICD-10-CM | POA: Diagnosis not present

## 2023-12-19 DIAGNOSIS — F32A Depression, unspecified: Secondary | ICD-10-CM | POA: Diagnosis not present

## 2023-12-19 DIAGNOSIS — D696 Thrombocytopenia, unspecified: Secondary | ICD-10-CM | POA: Diagnosis not present

## 2023-12-19 DIAGNOSIS — S32000D Wedge compression fracture of unspecified lumbar vertebra, subsequent encounter for fracture with routine healing: Secondary | ICD-10-CM | POA: Diagnosis not present

## 2023-12-19 DIAGNOSIS — S52122D Displaced fracture of head of left radius, subsequent encounter for closed fracture with routine healing: Secondary | ICD-10-CM | POA: Diagnosis not present

## 2023-12-19 DIAGNOSIS — R413 Other amnesia: Secondary | ICD-10-CM | POA: Diagnosis not present

## 2023-12-19 DIAGNOSIS — S32002D Unstable burst fracture of unspecified lumbar vertebra, subsequent encounter for fracture with routine healing: Secondary | ICD-10-CM | POA: Diagnosis not present

## 2023-12-19 DIAGNOSIS — R2689 Other abnormalities of gait and mobility: Secondary | ICD-10-CM | POA: Diagnosis not present

## 2023-12-19 DIAGNOSIS — M6281 Muscle weakness (generalized): Secondary | ICD-10-CM | POA: Diagnosis not present

## 2023-12-19 DIAGNOSIS — I4891 Unspecified atrial fibrillation: Secondary | ICD-10-CM | POA: Diagnosis not present

## 2023-12-19 DIAGNOSIS — I480 Paroxysmal atrial fibrillation: Secondary | ICD-10-CM | POA: Diagnosis not present

## 2023-12-19 DIAGNOSIS — S32592D Other specified fracture of left pubis, subsequent encounter for fracture with routine healing: Secondary | ICD-10-CM | POA: Diagnosis not present

## 2023-12-19 DIAGNOSIS — J449 Chronic obstructive pulmonary disease, unspecified: Secondary | ICD-10-CM | POA: Diagnosis not present

## 2023-12-19 DIAGNOSIS — I5032 Chronic diastolic (congestive) heart failure: Secondary | ICD-10-CM | POA: Diagnosis not present

## 2023-12-19 DIAGNOSIS — E559 Vitamin D deficiency, unspecified: Secondary | ICD-10-CM | POA: Diagnosis not present

## 2023-12-19 DIAGNOSIS — N3000 Acute cystitis without hematuria: Secondary | ICD-10-CM | POA: Diagnosis not present

## 2023-12-19 DIAGNOSIS — S3289XA Fracture of other parts of pelvis, initial encounter for closed fracture: Secondary | ICD-10-CM | POA: Diagnosis not present

## 2023-12-19 DIAGNOSIS — M4982 Spondylopathy in diseases classified elsewhere, cervical region: Secondary | ICD-10-CM | POA: Diagnosis not present

## 2023-12-19 DIAGNOSIS — R251 Tremor, unspecified: Secondary | ICD-10-CM | POA: Diagnosis not present

## 2023-12-19 DIAGNOSIS — Z7401 Bed confinement status: Secondary | ICD-10-CM | POA: Diagnosis not present

## 2023-12-19 DIAGNOSIS — I5030 Unspecified diastolic (congestive) heart failure: Secondary | ICD-10-CM | POA: Diagnosis not present

## 2023-12-19 DIAGNOSIS — J99 Respiratory disorders in diseases classified elsewhere: Secondary | ICD-10-CM | POA: Diagnosis not present

## 2023-12-19 DIAGNOSIS — R300 Dysuria: Secondary | ICD-10-CM | POA: Diagnosis not present

## 2023-12-19 DIAGNOSIS — D7581 Myelofibrosis: Secondary | ICD-10-CM | POA: Diagnosis not present

## 2023-12-19 DIAGNOSIS — S32050D Wedge compression fracture of fifth lumbar vertebra, subsequent encounter for fracture with routine healing: Secondary | ICD-10-CM | POA: Diagnosis not present

## 2023-12-19 DIAGNOSIS — R2681 Unsteadiness on feet: Secondary | ICD-10-CM | POA: Diagnosis not present

## 2023-12-19 DIAGNOSIS — D72829 Elevated white blood cell count, unspecified: Secondary | ICD-10-CM | POA: Diagnosis not present

## 2023-12-19 DIAGNOSIS — S32810D Multiple fractures of pelvis with stable disruption of pelvic ring, subsequent encounter for fracture with routine healing: Secondary | ICD-10-CM | POA: Diagnosis not present

## 2023-12-19 DIAGNOSIS — S52125A Nondisplaced fracture of head of left radius, initial encounter for closed fracture: Secondary | ICD-10-CM | POA: Diagnosis not present

## 2023-12-19 DIAGNOSIS — F329 Major depressive disorder, single episode, unspecified: Secondary | ICD-10-CM | POA: Diagnosis not present

## 2023-12-19 DIAGNOSIS — F419 Anxiety disorder, unspecified: Secondary | ICD-10-CM | POA: Diagnosis not present

## 2023-12-19 DIAGNOSIS — D61818 Other pancytopenia: Secondary | ICD-10-CM | POA: Diagnosis not present

## 2023-12-19 LAB — CBC
HCT: 32.9 % — ABNORMAL LOW (ref 36.0–46.0)
Hemoglobin: 11.1 g/dL — ABNORMAL LOW (ref 12.0–15.0)
MCH: 30.2 pg (ref 26.0–34.0)
MCHC: 33.7 g/dL (ref 30.0–36.0)
MCV: 89.4 fL (ref 80.0–100.0)
Platelets: 81 K/uL — ABNORMAL LOW (ref 150–400)
RBC: 3.68 MIL/uL — ABNORMAL LOW (ref 3.87–5.11)
RDW: 12.8 % (ref 11.5–15.5)
WBC: 12.1 K/uL — ABNORMAL HIGH (ref 4.0–10.5)
nRBC: 0 % (ref 0.0–0.2)

## 2023-12-19 MED ORDER — LIDOCAINE 5 % EX PTCH: 1.0000 | MEDICATED_PATCH | CUTANEOUS | 0 refills | Status: DC

## 2023-12-19 MED ORDER — OXYCODONE HCL 10 MG PO TABS: 10.0000 mg | ORAL_TABLET | ORAL | 0 refills | Status: DC | PRN

## 2023-12-19 MED ORDER — DIAZEPAM 5 MG PO TABS: 5.0000 mg | ORAL_TABLET | Freq: Two times a day (BID) | ORAL | 0 refills | Status: DC

## 2023-12-19 NOTE — Discharge Summary (Signed)
 Physician Discharge Summary  Anita Harrell FMW:[id number] DOB: 1950-04-20 DOA: 12/14/2023  PCP: Randeen [name], MD  Admit date: 12/14/2023 Discharge date: 12/19/2023  Admitted From: home Disposition:  SNF  Recommendations for Outpatient Follow-up:  Follow up with PCP in 1-2 weeks Please obtain BMP/CBC in one week Follow-up with orthopedic surgery in 1 to 2 weeks  Home Health: none Equipment/Devices: none  Discharge Condition: stable CODE STATUS: Full code Diet Orders (From admission, onward)     Start     Ordered   12/15/23 1311  Diet Heart Room service appropriate? Yes; Fluid consistency: Thin  Diet effective now       Question Answer Comment  Room service appropriate? Yes   Fluid consistency: Thin      12/15/23 1310            Brief Narrative: Anita Harrell is a 73 y.o. female with a history of chronic HFpEF, HTN, HLD, Afib on eliquis , COPD, depression/anxiety who presented to the ED on 12/14/2023 with left elbow and hip pain after falling down 3 stairs at home. She was diagnosed with acute, mildly displaced fractures of the left superior and inferior pubic rami, left sacral ala, superior endplate of L5 with minimal height loss, and minimally displaced, well-aligned intraarticular fractures of the left upper extremity at the radial head and olecranon. She was admitted for PT/OT, pain control, and orthopedics consultation. A splint to the LUE has been applied, patient is NWB LUE. WBAT for ambulation per orthopedics who recommends against surgery.  Hospital Course / Discharge diagnoses: Principal problem Left radial head and olecranon process fractures - Continue immobilization, pain-control, and supportive care. Continue PT/OT as able (NWB LUE). Follow up with orthopedics in ~ 2 weeks for splint removal, repeat films   Active problems Left pubic rami and sacral ala fractures with hematoma - Continue pain-control and supportive care. Hgb stabilized at 11.4g/dl. WBAT,  continue PT/OT mobilization efforts. Orthopedics follow up as an outpatient.  She is back on her blood thinners  L5 superior end plate fracture: Minimal height-loss, no retropulsion, neurologically intact  PAF - Continue flecainide , metoprolol , eliquis  Chronic HFpEF - EF was 50-55% on echo from December 2023. Appears compensated  COPD; OSA - Not in exacerbation. Continue ICS-LAMA-LABA, CPAP and 2 Lpm supplemental O2 while sleeping (baseline). Depression, anxiety - Continue cymbalta , trazodone , and holistic supportive care.  Leukocytosis - Suspect reactive since improving without abx. 21 > 11. Continue monitoring off abx for now.   Thrombocytopenia - Chronic, stabilized in 70's. No bleeding. Can restart DOAC.  Memory loss - Followed by [facility] neurology and undergoing workup for possible Lewy body dementia. Delirium precautions.  Alert and appropriate while here  Sepsis ruled out  Discharge Instructions  Allergies as of 12/19/2023       Reactions   Abilify [aripiprazole] Other (See Comments)   Vision problems   Ambien  [zolpidem ] Other (See Comments)   Hallucinations MS change    Codeine Nausea And Vomiting   Patient able to tolerate hydrocodone  and oxycodone    Lipitor [atorvastatin] Other (See Comments)   Myalgias    Vesicare  [solifenacin ] Other (See Comments)   MS change   Wellbutrin  [bupropion ] Other (See Comments)   Unknown reaction        Medication List     STOP taking these medications    HYDROcodone -acetaminophen  5-325 MG tablet Commonly known as: NORCO/VICODIN   traMADol  50 MG tablet Commonly known as: ULTRAM        TAKE these medications  acetaminophen  500 MG tablet Commonly known as: TYLENOL  Take 500 mg by mouth in the morning and at bedtime.   albuterol  108 (90 Base) MCG/ACT inhaler Commonly known as: VENTOLIN  HFA Inhale 2 puffs into the lungs every 6 (six) hours as needed for wheezing or shortness of breath.   apixaban  5 MG Tabs tablet Commonly known  as: ELIQUIS  Take 5 mg by mouth 2 (two) times daily.   cyclobenzaprine  10 MG tablet Commonly known as: FLEXERIL  TAKE ONE HALF (1/2) TO ONE TABLET BY MOUTH AT BEDTIME AS NEEDED FOR MUSCLE SPASMS   diazepam 5 MG tablet Commonly known as: VALIUM Take 1 tablet (5 mg total) by mouth in the morning and at bedtime.   donepezil 5 MG tablet Commonly known as: ARICEPT Take 5 mg by mouth daily.   DULoxetine  60 MG capsule Commonly known as: CYMBALTA  Take 120 mg by mouth daily.   flecainide  100 MG tablet Commonly known as: TAMBOCOR  Take 1 tablet (100 mg total) by mouth 2 (two) times daily.   furosemide  20 MG tablet Commonly known as: LASIX  TAKE ONE TABLET (20 MG TOTAL) BY MOUTH DAILY.   hydrOXYzine 25 MG capsule Commonly known as: VISTARIL Take 25 mg by mouth 2 (two) times daily.   lidocaine  5 % Commonly known as: LIDODERM  Place 1 patch onto the skin daily. Remove & Discard patch within 12 hours or as directed by MD Start taking on: December 20, 2023   Melatonin 5 MG Caps Take 10 mg by mouth at bedtime.   metoprolol  succinate 50 MG 24 hr tablet Commonly known as: TOPROL -XL TAKE ONE TABLET BY MOUTH TWICE A DAY WITH MEALS   Oxycodone  HCl 10 MG Tabs Take 1 tablet (10 mg total) by mouth every 4 (four) hours as needed for severe pain (pain score 7-10) or breakthrough pain.   PARoxetine  10 MG tablet Commonly known as: PAXIL  Take 1 tablet by mouth daily.   potassium chloride  SA 20 MEQ tablet Commonly known as: KLOR-CON  M Take 1 tablet (20 mEq total) by mouth daily. What changed: when to take this   rosuvastatin  10 MG tablet Commonly known as: CRESTOR  TAKE ONE TABLET BY MOUTH ONCE A DAY   traZODone  100 MG tablet Commonly known as: DESYREL  Take 200 mg by mouth at bedtime.   Trelegy Ellipta  100-62.5-25 MCG/ACT Aepb Generic drug: Fluticasone -Umeclidin-Vilant Inhale 1 puff into the lungs daily.        Contact information for after-discharge care     Destination      [facility] .   Service: Skilled Nursing Contact information: [address] [place]  [address] [phone]                     Consultations: Orthopedic surgery   Procedures/Studies:  CT Elbow Left Wo Contrast Result Date: 12/15/2023 EXAM: CT Left Upper Extremity, Without IV Contrast TECHNIQUE: Axial images were acquired through the left upper extremity without IV contrast. Reformatted images were reviewed. Automated exposure control, iterative reconstruction, and/or weight-based adjustment of the mA/kV was utilized to reduce the radiation dose to as low as reasonably achievable. COMPARISON: None available. CLINICAL HISTORY: Elbow trauma. Mechanical fall onto left side. Pt c/o pain in left hip and elbow. Pt hit left side of head. No LOC. FINDINGS: BONES AND JOINTS: The osseous structures are mildly osteopenic. There is a minimally displaced anatomic aligned intraarticular fracture of the radial head best seen on image 53/7 and 80/3 with fracture fragments in anatomic  alignment. The articular surface appears congruent. There is, additionally, a minimally displaced anatomically aligned fracture of the olecranon, best seen on image 43/7 and 80/3. Normal overall alignment. No dislocation. Moderate degenerative arthritis involving the ulnohumeral and radiocapitellar joints. SOFT TISSUES: Small left elbow effusion is present. There is infiltration within the antecubital fossa in keeping with edema and hemorrhage. No mass or hematoma identified. IMPRESSION: 1. Minimally displaced, anatomically aligned intraarticular fractures of the radial head and fracture of the olecranon. No dislocation. 2. Small left elbow effusion and antecubital fossa edema/hemorrhage. No mass or hematoma identified. 3. Moderate degenerative arthritis involving the ulnohumeral and radiocapitellar joints. Electronically signed by: Dorethia Molt MD 12/15/2023 03:02 AM EDT RP  Workstation: [id number]   CT CHEST ABDOMEN PELVIS W CONTRAST Result Date: 12/15/2023 EXAM: CT CHEST, ABDOMEN AND PELVIS WITH CONTRAST 12/15/2023 01:57:15 AM TECHNIQUE: CT of the chest, abdomen and pelvis was performed with the administration of intravenous contrast, 75mL (iohexol  (OMNIPAQUE ) 350 MG/ML injection 75 mL IOHEXOL  350 MG/ML SOLN). Multiplanar reformatted images are provided for review. Automated exposure control, iterative reconstruction, and/or weight based adjustment of the mA/kV was utilized to reduce the radiation dose to as low as reasonably achievable. COMPARISON: Comparison examination is January 29, 2023. CLINICAL HISTORY: Polytrauma, blunt. Mechanical fall onto left side. Patient complains of pain in left hip and elbow. Patient hit left side of head. No loss of consciousness. FINDINGS: CHEST: MEDIASTINUM AND LYMPH NODES: Mild coronary artery calcification. Global cardiac size within normal limits. No pericardial effusion. Central pulmonary arteries are of normal caliber. Moderate atherosclerotic calcification within the thoracic aorta. No aortic aneurysm. Surgical changes of left thyroidectomy identified. Multiple nodules identified within the visual and right thyroid  lobe measuring up to 12 mm which was previously biopsied on November 06, 2023. No follow-up imaging recommended. No pathologic thoracic adenopathy. The esophagus is unremarkable. LUNGS AND PLEURA: Mild emphysema. 7 mm sub solid nodule within the right apex, image 40/5, stable. Stable right pleural thickening. No confluent pulmonary infiltrate. No pneumothorax or pleural effusion. Multiple healed right rib fractures are identified. ABDOMEN AND PELVIS: LIVER: Mild hepatic steatosis. GALLBLADDER AND BILE DUCTS: Cholelithiasis without superimposed pericholecystic inflammatory change. No intra or extrahepatic biliary ductal dilation. SPLEEN: No acute abnormality. PANCREAS: No acute abnormality. ADRENAL GLANDS: No acute abnormality.  KIDNEYS, URETERS AND BLADDER: No stones in the kidneys or ureters. No hydronephrosis. No perinephric or periureteral stranding. Urinary bladder is unremarkable, with moderate acute interstitial hemorrhage within the space of retzius adjacent to the pubic symphyseal fractures demonstrating mild mass effect on the bladder. GI AND BOWEL: The stomach, small bowel, and large bowel are otherwise unremarkable. Appendix is normal. Moderate sigmoid diverticulosis. No free intraperitoneal gas or fluid. REPRODUCTIVE ORGANS: Uterus is absent. No adnexal masses. PERITONEUM AND RETROPERITONEUM: No ascites. VASCULATURE: Moderate aortoiliac atherosclerotic calcification. No aortic aneurysm. BONES AND SOFT TISSUES: Acute mildly displaced fractures of the left superior and inferior pubic rami at the pubic symphysis as well as the left sacral ala demonstrate mild impaction in keeping with a lateral compression type injury. Acute superior endplate fracture of L5 with minimal loss of height. No retropulsion. Osseous structures are diffusely osteopenic. Mild lumbar levoscoliosis. Rotator cuff scarring. IMPRESSION: 1. Acute mildly displaced fractures of the left superior and inferior pubic rami at the pubic symphysis and the left sacral ala with mild impaction, consistent with a lateral compression type injury. Associated moderate acute interstitial extraperitoneal hemorrhage with mild mass effect on the bladder. No active extravasation. 2. Acute superior endplate fracture of L5  with minimal loss of height. No retropulsion. 3. Multiple healed right rib fractures. No acute bone abnormality within the thorax . 4. Additional incidental findings as noted above. Electronically signed by: Dorethia Molt MD 12/15/2023 02:42 AM EDT RP Workstation: [id number]   CT CERVICAL SPINE WO CONTRAST Result Date: 12/15/2023 CLINICAL DATA:  Status post trauma. EXAM: CT CERVICAL SPINE WITHOUT CONTRAST TECHNIQUE: Multidetector CT imaging of the cervical  spine was performed without intravenous contrast. Multiplanar CT image reconstructions were also generated. RADIATION DOSE REDUCTION: This exam was performed according to the departmental dose-optimization program which includes automated exposure control, adjustment of the mA and/or kV according to patient size and/or use of iterative reconstruction technique. COMPARISON:  None Available. FINDINGS: Alignment: There is straightening of the normal cervical spine lordosis. Skull base and vertebrae: No acute fracture. No primary bone lesion or focal pathologic process. Soft tissues and spinal canal: No prevertebral fluid or swelling. No visible canal hematoma. Disc levels: Moderate severity multilevel endplate sclerosis is seen throughout the cervical spine with mild to moderate severity anterior osteophyte formation and posterior bony spurring also present at the levels of C4-C5, C5-C6 and C6-C7. Moderate to marked severity intervertebral disc space narrowing is seen at C6-C7, with mild to moderate severity intervertebral disc space narrowing present throughout the remainder of the cervical spine. Marked severity bilateral multilevel facet joint hypertrophy is noted. Upper chest: Mild areas of scarring and/or atelectasis are seen within the bilateral apices. Other: Small thyroid  nodules are seen within the right lobe of the thyroid  gland. The thyroid  gland is not identified. IMPRESSION: 1. No acute fracture or subluxation in the cervical spine. 2. Moderate to marked severity multilevel degenerative changes, as described above. Electronically Signed   By: Suzen Dials M.D.   On: 12/15/2023 00:34   CT HEAD WO CONTRAST Result Date: 12/15/2023 CLINICAL DATA:  Status post trauma. EXAM: CT HEAD WITHOUT CONTRAST TECHNIQUE: Contiguous axial images were obtained from the base of the skull through the vertex without intravenous contrast. RADIATION DOSE REDUCTION: This exam was performed according to the departmental  dose-optimization program which includes automated exposure control, adjustment of the mA and/or kV according to patient size and/or use of iterative reconstruction technique. COMPARISON:  January 29, 2020 FINDINGS: Brain: There is generalized cerebral atrophy with widening of the extra-axial spaces and ventricular dilatation. There are areas of decreased attenuation within the white matter tracts of the supratentorial brain, consistent with microvascular disease changes. Vascular: No hyperdense vessel or unexpected calcification. Skull: Normal. Negative for fracture or focal lesion. Sinuses/Orbits: No acute finding. Other: None. IMPRESSION: 1. No acute intracranial abnormality. 2. Generalized cerebral atrophy and microvascular disease changes of the supratentorial brain. Electronically Signed   By: Suzen Dials M.D.   On: 12/15/2023 00:29   DG Chest Port 1 View Result Date: 12/15/2023 CLINICAL DATA:  Status post trauma. EXAM: PORTABLE CHEST 1 VIEW COMPARISON:  November 18, 2022 FINDINGS: The heart size and mediastinal contours are within normal limits. Low lung volumes are noted with mild elevation of the right hemidiaphragm. No acute infiltrate, pleural effusion or pneumothorax is identified. A mildly displaced ninth right rib fracture is suspected. Degenerative changes are seen involving both shoulders and multiple levels of the thoracic spine. IMPRESSION: 1. Low lung volumes without acute cardiopulmonary disease. 2. Suspected mildly displaced ninth right rib fracture. Correlation with physical examination is recommended to determine the presence of point tenderness. Electronically Signed   By: Suzen Dials M.D.   On: 12/15/2023 00:26   DG  Hip Unilat W or Wo Pelvis 2-3 Views Left Result Date: 12/15/2023 CLINICAL DATA:  Status post fall. EXAM: DG HIP (WITH OR WITHOUT PELVIS) 2-3V LEFT COMPARISON:  None Available. FINDINGS: A fracture deformity of indeterminate age is seen involving the symphysis  pubis on the left with extension to involve the adjacent portions of the left superior and left inferior pubic rami. There is no evidence of dislocation. Degenerative changes are seen in the form of joint space narrowing and acetabular sclerosis. IMPRESSION: Fracture of the symphysis pubis on the left with extension to involve the adjacent portions of the left superior and left inferior pubic rami. CT correlation is recommended. Electronically Signed   By: Suzen Dials M.D.   On: 12/15/2023 00:21   DG Elbow 2 Views Left Result Date: 12/15/2023 CLINICAL DATA:  Status post trauma. EXAM: LEFT ELBOW - 2 VIEW COMPARISON:  None Available. FINDINGS: The cortical borders of the left radial head and neck are irregular in appearance on the lateral view. This is of indeterminate age. There is no evidence of dislocation. A superficial soft tissue laceration is seen along the medial aspect of the proximal left forearm. IMPRESSION: Findings which may represent a nondisplaced fracture of the left radial head and neck of indeterminate age. Correlation with physical examination is recommended to determine the presence of point tenderness. Electronically Signed   By: Suzen Dials M.D.   On: 12/15/2023 00:18   DG Knee Left Port Result Date: 12/15/2023 CLINICAL DATA:  Status post fall. EXAM: PORTABLE LEFT KNEE - 1-2 VIEW COMPARISON:  None Available. FINDINGS: No evidence of an acute fracture or dislocation. Marked severity tricompartmental degenerative changes are seen. Soft tissues are unremarkable. IMPRESSION: Marked severity tricompartmental degenerative changes. Electronically Signed   By: Suzen Dials M.D.   On: 12/15/2023 00:15     Subjective: - no chest pain, shortness of breath, no abdominal pain, nausea or vomiting.   Discharge Exam: BP (!) 120/52 (BP Location: Right Arm)   Pulse 73   Temp 98.1 F (36.7 C) (Oral)   Resp 18   Ht 5' 5 (1.651 m)   Wt 80.3 kg   SpO2 100%   BMI 29.46 kg/m    General: Pt is alert, awake, not in acute distress Cardiovascular: RRR, S1/S2 +, no rubs, no gallops Respiratory: CTA bilaterally, no wheezing, no rhonchi Abdominal: Soft, NT, ND, bowel sounds + Extremities: no edema, no cyanosis    The results of significant diagnostics from this hospitalization (including imaging, microbiology, ancillary and laboratory) are listed below for reference.     Microbiology: No results found for this or any previous visit (from the past 240 hours).   Labs: Basic Metabolic Panel: Recent Labs  Lab 12/14/23 2359 12/15/23 1011 12/16/23 0307 12/17/23 0609 12/18/23 0613  NA 137 139 139 139 142  K 3.5 3.7 3.4* 3.7 3.8  CL 102 108 107 105 104  CO2 20* 22 25 24 30   GLUCOSE 108* 98 115* 96 101*  BUN 9 5* 8 7* 6*  CREATININE 0.72 0.54 0.66 0.43* 0.54  CALCIUM  9.0 8.6* 8.7* 8.7* 9.1   Liver Function Tests: Recent Labs  Lab 12/14/23 2359  AST 25  ALT 28  ALKPHOS 75  BILITOT 0.9  PROT 6.2*  ALBUMIN 3.3*   CBC: Recent Labs  Lab 12/15/23 1011 12/16/23 0307 12/17/23 0609 12/18/23 0613 12/19/23 0641  WBC 18.0* 13.6* 11.2* 11.2* 12.1*  HGB 12.3 11.5* 11.4* 11.4* 11.1*  HCT 36.7 34.5* 34.6* 34.2* 32.9*  MCV 88.4 89.4 90.1 89.3 89.4  PLT 76* 75* 64* 70* 81*   CBG: No results for input(s): GLUCAP in the last 168 hours. Hgb A1c No results for input(s): HGBA1C in the last 72 hours. Lipid Profile No results for input(s): CHOL, HDL, LDLCALC, TRIG, CHOLHDL, LDLDIRECT in the last 72 hours. Thyroid  function studies No results for input(s): TSH, T4TOTAL, T3FREE, THYROIDAB in the last 72 hours.  Invalid input(s): FREET3 Urinalysis    Component Value Date/Time   COLORURINE YELLOW 12/15/2023 0220   APPEARANCEUR HAZY (A) 12/15/2023 0220   LABSPEC 1.029 12/15/2023 0220   PHURINE 7.0 12/15/2023 0220   GLUCOSEU NEGATIVE 12/15/2023 0220   HGBUR NEGATIVE 12/15/2023 0220   BILIRUBINUR NEGATIVE 12/15/2023 0220    BILIRUBINUR  04/03/2023 1410     Comment:     Unable to read due to pt taking AZO   KETONESUR 5 (A) 12/15/2023 0220   PROTEINUR NEGATIVE 12/15/2023 0220   UROBILINOGEN  04/03/2023 1410     Comment:     Unable to read due to pt taking AZO   NITRITE NEGATIVE 12/15/2023 0220   LEUKOCYTESUR NEGATIVE 12/15/2023 0220    FURTHER DISCHARGE INSTRUCTIONS:   Get Medicines reviewed and adjusted: Please take all your medications with you for your next visit with your Primary MD   Laboratory/radiological data: Please request your Primary MD to go over all hospital tests and procedure/radiological results at the follow up, please ask your Primary MD to get all Hospital records sent to his/her office.   In some cases, they will be blood work, cultures and biopsy results pending at the time of your discharge. Please request that your primary care M.D. goes through all the records of your hospital data and follows up on these results.   Also Note the following: If you experience worsening of your admission symptoms, develop shortness of breath, life threatening emergency, suicidal or homicidal thoughts you must seek medical attention immediately by calling 911 or calling your MD immediately  if symptoms less severe.   You must read complete instructions/literature along with all the possible adverse reactions/side effects for all the Medicines you take and that have been prescribed to you. Take any new Medicines after you have completely understood and accpet all the possible adverse reactions/side effects.    Do not drive when taking Pain medications or sleeping medications (Benzodaizepines)   Do not take more than prescribed Pain, Sleep and Anxiety Medications. It is not advisable to combine anxiety,sleep and pain medications without talking with your primary care practitioner   Special Instructions: If you have smoked or chewed Tobacco  in the last 2 yrs please stop smoking, stop any regular Alcohol   and or any Recreational drug use.   Wear Seat belts while driving.   Please note: You were cared for by a hospitalist during your hospital stay. Once you are discharged, your primary care physician will handle any further medical issues. Please note that NO REFILLS for any discharge medications will be authorized once you are discharged, as it is imperative that you return to your primary care physician (or establish a relationship with a primary care physician if you do not have one) for your post hospital discharge needs so that they can reassess your need for medications and monitor your lab values.  Time coordinating discharge: 35 minutes  SIGNED:  Nilda Fendt, MD, PhD 12/19/2023, 9:48 AM

## 2023-12-19 NOTE — Plan of Care (Signed)

## 2023-12-19 NOTE — Progress Notes (Signed)
 Report called to [employer]

## 2023-12-19 NOTE — TOC Transition Note (Signed)
 Transition of Care [facility]) - Discharge Note   Patient Details  Name: Anita Harrell MRN: [id number] Date of Birth: 09-29-1950  Transition of Care [facility]) CM/SW Contact:  Jeoffrey LITTIE Maranda ISRAEL Phone Number: 12/19/2023, 10:16 AM   Clinical Narrative:    [facility] will DC to: [name] Anticipated DC date: 12/19/23 Family notified: Yes Transport by: [facility]   Per MD patient ready for DC to [facility]. RN to call report prior to discharge [phone] room 203. RN, patient, patient's family, and facility notified of DC. Discharge Summary and FL2 sent to facility. DC packet on chart. Ambulance transport requested for patient.   CSW will sign off for now as social work intervention is no longer needed. Please consult us  again if new needs arise.     Final next level of care: Skilled Nursing Facility Barriers to Discharge: Barriers Resolved   Patient Goals and CMS Choice Patient states their goals for this hospitalization and ongoing recovery are:: SNF          Discharge Placement   Existing PASRR number confirmed : 12/19/23          Patient chooses bed at: [facility] Patient to be transferred to facility by: [facility] Name of family member notified: [name] Patient and family notified of of transfer: 12/19/23  Discharge Plan and Services Additional resources added to the After Visit Summary for                                       Social Drivers of Health (SDOH) Interventions SDOH Screenings   Food Insecurity: No Food Insecurity (12/16/2023)  Housing: Unknown (12/16/2023)  Transportation Needs: No Transportation Needs (12/16/2023)  Utilities: Not At Risk (12/16/2023)  Alcohol Screen: Low Risk  (07/27/2023)  Depression (PHQ2-9): High Risk (07/27/2023)  Financial Resource Strain: Low Risk  (09/19/2023)   Received from [facility]  Physical Activity: Inactive (07/27/2023)  Social Connections: Moderately Isolated (12/16/2023)  Stress: No Stress Concern  Present (07/27/2023)  Tobacco Use: Medium Risk (12/15/2023)  Health Literacy: Adequate Health Literacy (07/27/2023)     Readmission Risk Interventions     No data to display

## 2023-12-20 DIAGNOSIS — I4891 Unspecified atrial fibrillation: Secondary | ICD-10-CM | POA: Diagnosis not present

## 2023-12-20 DIAGNOSIS — I5030 Unspecified diastolic (congestive) heart failure: Secondary | ICD-10-CM | POA: Diagnosis not present

## 2023-12-20 DIAGNOSIS — J440 Chronic obstructive pulmonary disease with acute lower respiratory infection: Secondary | ICD-10-CM | POA: Diagnosis not present

## 2023-12-20 DIAGNOSIS — S32509D Unspecified fracture of unspecified pubis, subsequent encounter for fracture with routine healing: Secondary | ICD-10-CM | POA: Diagnosis not present

## 2023-12-20 DIAGNOSIS — S32050D Wedge compression fracture of fifth lumbar vertebra, subsequent encounter for fracture with routine healing: Secondary | ICD-10-CM | POA: Diagnosis not present

## 2023-12-20 DIAGNOSIS — R413 Other amnesia: Secondary | ICD-10-CM | POA: Diagnosis not present

## 2023-12-20 DIAGNOSIS — S52122D Displaced fracture of head of left radius, subsequent encounter for closed fracture with routine healing: Secondary | ICD-10-CM | POA: Diagnosis not present

## 2023-12-21 DIAGNOSIS — I5030 Unspecified diastolic (congestive) heart failure: Secondary | ICD-10-CM | POA: Diagnosis not present

## 2023-12-21 DIAGNOSIS — R2689 Other abnormalities of gait and mobility: Secondary | ICD-10-CM | POA: Diagnosis not present

## 2023-12-21 DIAGNOSIS — D61818 Other pancytopenia: Secondary | ICD-10-CM | POA: Diagnosis not present

## 2023-12-21 DIAGNOSIS — J440 Chronic obstructive pulmonary disease with acute lower respiratory infection: Secondary | ICD-10-CM | POA: Diagnosis not present

## 2023-12-21 DIAGNOSIS — I4891 Unspecified atrial fibrillation: Secondary | ICD-10-CM | POA: Diagnosis not present

## 2023-12-21 DIAGNOSIS — S3282XD Multiple fractures of pelvis without disruption of pelvic ring, subsequent encounter for fracture with routine healing: Secondary | ICD-10-CM | POA: Diagnosis not present

## 2023-12-21 DIAGNOSIS — F32A Depression, unspecified: Secondary | ICD-10-CM | POA: Diagnosis not present

## 2023-12-21 DIAGNOSIS — S32509D Unspecified fracture of unspecified pubis, subsequent encounter for fracture with routine healing: Secondary | ICD-10-CM | POA: Diagnosis not present

## 2023-12-21 DIAGNOSIS — S32050D Wedge compression fracture of fifth lumbar vertebra, subsequent encounter for fracture with routine healing: Secondary | ICD-10-CM | POA: Diagnosis not present

## 2023-12-21 DIAGNOSIS — M6281 Muscle weakness (generalized): Secondary | ICD-10-CM | POA: Diagnosis not present

## 2023-12-21 DIAGNOSIS — J449 Chronic obstructive pulmonary disease, unspecified: Secondary | ICD-10-CM | POA: Diagnosis not present

## 2023-12-21 DIAGNOSIS — S52122D Displaced fracture of head of left radius, subsequent encounter for closed fracture with routine healing: Secondary | ICD-10-CM | POA: Diagnosis not present

## 2023-12-21 DIAGNOSIS — I480 Paroxysmal atrial fibrillation: Secondary | ICD-10-CM | POA: Diagnosis not present

## 2023-12-24 DIAGNOSIS — I5032 Chronic diastolic (congestive) heart failure: Secondary | ICD-10-CM | POA: Diagnosis not present

## 2023-12-24 DIAGNOSIS — D72829 Elevated white blood cell count, unspecified: Secondary | ICD-10-CM | POA: Diagnosis not present

## 2023-12-24 DIAGNOSIS — S52122D Displaced fracture of head of left radius, subsequent encounter for closed fracture with routine healing: Secondary | ICD-10-CM | POA: Diagnosis not present

## 2023-12-25 DIAGNOSIS — S52122D Displaced fracture of head of left radius, subsequent encounter for closed fracture with routine healing: Secondary | ICD-10-CM | POA: Diagnosis not present

## 2023-12-25 DIAGNOSIS — I480 Paroxysmal atrial fibrillation: Secondary | ICD-10-CM | POA: Diagnosis not present

## 2023-12-25 DIAGNOSIS — R2689 Other abnormalities of gait and mobility: Secondary | ICD-10-CM | POA: Diagnosis not present

## 2023-12-25 DIAGNOSIS — M6281 Muscle weakness (generalized): Secondary | ICD-10-CM | POA: Diagnosis not present

## 2023-12-25 DIAGNOSIS — S3282XD Multiple fractures of pelvis without disruption of pelvic ring, subsequent encounter for fracture with routine healing: Secondary | ICD-10-CM | POA: Diagnosis not present

## 2023-12-25 DIAGNOSIS — F32A Depression, unspecified: Secondary | ICD-10-CM | POA: Diagnosis not present

## 2023-12-25 DIAGNOSIS — J449 Chronic obstructive pulmonary disease, unspecified: Secondary | ICD-10-CM | POA: Diagnosis not present

## 2023-12-25 DIAGNOSIS — D61818 Other pancytopenia: Secondary | ICD-10-CM | POA: Diagnosis not present

## 2023-12-25 DIAGNOSIS — S32050D Wedge compression fracture of fifth lumbar vertebra, subsequent encounter for fracture with routine healing: Secondary | ICD-10-CM | POA: Diagnosis not present

## 2023-12-28 DIAGNOSIS — S3282XD Multiple fractures of pelvis without disruption of pelvic ring, subsequent encounter for fracture with routine healing: Secondary | ICD-10-CM | POA: Diagnosis not present

## 2023-12-28 DIAGNOSIS — F32A Depression, unspecified: Secondary | ICD-10-CM | POA: Diagnosis not present

## 2023-12-28 DIAGNOSIS — J449 Chronic obstructive pulmonary disease, unspecified: Secondary | ICD-10-CM | POA: Diagnosis not present

## 2023-12-28 DIAGNOSIS — R2689 Other abnormalities of gait and mobility: Secondary | ICD-10-CM | POA: Diagnosis not present

## 2023-12-28 DIAGNOSIS — S52122D Displaced fracture of head of left radius, subsequent encounter for closed fracture with routine healing: Secondary | ICD-10-CM | POA: Diagnosis not present

## 2023-12-28 DIAGNOSIS — I480 Paroxysmal atrial fibrillation: Secondary | ICD-10-CM | POA: Diagnosis not present

## 2023-12-28 DIAGNOSIS — S32050D Wedge compression fracture of fifth lumbar vertebra, subsequent encounter for fracture with routine healing: Secondary | ICD-10-CM | POA: Diagnosis not present

## 2023-12-28 DIAGNOSIS — D61818 Other pancytopenia: Secondary | ICD-10-CM | POA: Diagnosis not present

## 2023-12-28 DIAGNOSIS — M6281 Muscle weakness (generalized): Secondary | ICD-10-CM | POA: Diagnosis not present

## 2023-12-31 DIAGNOSIS — I5030 Unspecified diastolic (congestive) heart failure: Secondary | ICD-10-CM | POA: Diagnosis not present

## 2023-12-31 DIAGNOSIS — S52122D Displaced fracture of head of left radius, subsequent encounter for closed fracture with routine healing: Secondary | ICD-10-CM | POA: Diagnosis not present

## 2023-12-31 DIAGNOSIS — S32050D Wedge compression fracture of fifth lumbar vertebra, subsequent encounter for fracture with routine healing: Secondary | ICD-10-CM | POA: Diagnosis not present

## 2023-12-31 DIAGNOSIS — R413 Other amnesia: Secondary | ICD-10-CM | POA: Diagnosis not present

## 2023-12-31 DIAGNOSIS — S32509D Unspecified fracture of unspecified pubis, subsequent encounter for fracture with routine healing: Secondary | ICD-10-CM | POA: Diagnosis not present

## 2023-12-31 DIAGNOSIS — I4891 Unspecified atrial fibrillation: Secondary | ICD-10-CM | POA: Diagnosis not present

## 2023-12-31 DIAGNOSIS — J440 Chronic obstructive pulmonary disease with acute lower respiratory infection: Secondary | ICD-10-CM | POA: Diagnosis not present

## 2024-01-01 DIAGNOSIS — S32592D Other specified fracture of left pubis, subsequent encounter for fracture with routine healing: Secondary | ICD-10-CM | POA: Diagnosis not present

## 2024-01-01 DIAGNOSIS — S32592A Other specified fracture of left pubis, initial encounter for closed fracture: Secondary | ICD-10-CM | POA: Diagnosis not present

## 2024-01-01 DIAGNOSIS — S52125A Nondisplaced fracture of head of left radius, initial encounter for closed fracture: Secondary | ICD-10-CM | POA: Diagnosis not present

## 2024-01-03 ENCOUNTER — Other Ambulatory Visit: Payer: Self-pay | Admitting: Cardiovascular Disease

## 2024-01-04 DIAGNOSIS — J449 Chronic obstructive pulmonary disease, unspecified: Secondary | ICD-10-CM | POA: Diagnosis not present

## 2024-01-04 DIAGNOSIS — S52122D Displaced fracture of head of left radius, subsequent encounter for closed fracture with routine healing: Secondary | ICD-10-CM | POA: Diagnosis not present

## 2024-01-04 DIAGNOSIS — D61818 Other pancytopenia: Secondary | ICD-10-CM | POA: Diagnosis not present

## 2024-01-04 DIAGNOSIS — M6281 Muscle weakness (generalized): Secondary | ICD-10-CM | POA: Diagnosis not present

## 2024-01-04 DIAGNOSIS — I480 Paroxysmal atrial fibrillation: Secondary | ICD-10-CM | POA: Diagnosis not present

## 2024-01-04 DIAGNOSIS — S3282XD Multiple fractures of pelvis without disruption of pelvic ring, subsequent encounter for fracture with routine healing: Secondary | ICD-10-CM | POA: Diagnosis not present

## 2024-01-04 DIAGNOSIS — R2689 Other abnormalities of gait and mobility: Secondary | ICD-10-CM | POA: Diagnosis not present

## 2024-01-04 DIAGNOSIS — F32A Depression, unspecified: Secondary | ICD-10-CM | POA: Diagnosis not present

## 2024-01-04 DIAGNOSIS — S32050D Wedge compression fracture of fifth lumbar vertebra, subsequent encounter for fracture with routine healing: Secondary | ICD-10-CM | POA: Diagnosis not present

## 2024-01-08 DIAGNOSIS — R2689 Other abnormalities of gait and mobility: Secondary | ICD-10-CM | POA: Diagnosis not present

## 2024-01-08 DIAGNOSIS — S3282XD Multiple fractures of pelvis without disruption of pelvic ring, subsequent encounter for fracture with routine healing: Secondary | ICD-10-CM | POA: Diagnosis not present

## 2024-01-08 DIAGNOSIS — J449 Chronic obstructive pulmonary disease, unspecified: Secondary | ICD-10-CM | POA: Diagnosis not present

## 2024-01-08 DIAGNOSIS — D61818 Other pancytopenia: Secondary | ICD-10-CM | POA: Diagnosis not present

## 2024-01-08 DIAGNOSIS — F32A Depression, unspecified: Secondary | ICD-10-CM | POA: Diagnosis not present

## 2024-01-08 DIAGNOSIS — I480 Paroxysmal atrial fibrillation: Secondary | ICD-10-CM | POA: Diagnosis not present

## 2024-01-08 DIAGNOSIS — S32050D Wedge compression fracture of fifth lumbar vertebra, subsequent encounter for fracture with routine healing: Secondary | ICD-10-CM | POA: Diagnosis not present

## 2024-01-08 DIAGNOSIS — M6281 Muscle weakness (generalized): Secondary | ICD-10-CM | POA: Diagnosis not present

## 2024-01-08 DIAGNOSIS — S52122D Displaced fracture of head of left radius, subsequent encounter for closed fracture with routine healing: Secondary | ICD-10-CM | POA: Diagnosis not present

## 2024-01-09 DIAGNOSIS — R300 Dysuria: Secondary | ICD-10-CM | POA: Diagnosis not present

## 2024-01-10 DIAGNOSIS — F419 Anxiety disorder, unspecified: Secondary | ICD-10-CM | POA: Diagnosis not present

## 2024-01-10 DIAGNOSIS — I5032 Chronic diastolic (congestive) heart failure: Secondary | ICD-10-CM | POA: Diagnosis not present

## 2024-01-10 DIAGNOSIS — S32050D Wedge compression fracture of fifth lumbar vertebra, subsequent encounter for fracture with routine healing: Secondary | ICD-10-CM | POA: Diagnosis not present

## 2024-01-10 DIAGNOSIS — D72829 Elevated white blood cell count, unspecified: Secondary | ICD-10-CM | POA: Diagnosis not present

## 2024-01-10 DIAGNOSIS — D696 Thrombocytopenia, unspecified: Secondary | ICD-10-CM | POA: Diagnosis not present

## 2024-01-10 DIAGNOSIS — R300 Dysuria: Secondary | ICD-10-CM | POA: Diagnosis not present

## 2024-01-10 DIAGNOSIS — R413 Other amnesia: Secondary | ICD-10-CM | POA: Diagnosis not present

## 2024-01-10 DIAGNOSIS — J440 Chronic obstructive pulmonary disease with acute lower respiratory infection: Secondary | ICD-10-CM | POA: Diagnosis not present

## 2024-01-10 DIAGNOSIS — I4891 Unspecified atrial fibrillation: Secondary | ICD-10-CM | POA: Diagnosis not present

## 2024-01-10 DIAGNOSIS — S52122D Displaced fracture of head of left radius, subsequent encounter for closed fracture with routine healing: Secondary | ICD-10-CM | POA: Diagnosis not present

## 2024-01-10 DIAGNOSIS — F329 Major depressive disorder, single episode, unspecified: Secondary | ICD-10-CM | POA: Diagnosis not present

## 2024-01-10 DIAGNOSIS — S32509D Unspecified fracture of unspecified pubis, subsequent encounter for fracture with routine healing: Secondary | ICD-10-CM | POA: Diagnosis not present

## 2024-01-11 DIAGNOSIS — S52122D Displaced fracture of head of left radius, subsequent encounter for closed fracture with routine healing: Secondary | ICD-10-CM | POA: Diagnosis not present

## 2024-01-11 DIAGNOSIS — I480 Paroxysmal atrial fibrillation: Secondary | ICD-10-CM | POA: Diagnosis not present

## 2024-01-11 DIAGNOSIS — S32050D Wedge compression fracture of fifth lumbar vertebra, subsequent encounter for fracture with routine healing: Secondary | ICD-10-CM | POA: Diagnosis not present

## 2024-01-11 DIAGNOSIS — J449 Chronic obstructive pulmonary disease, unspecified: Secondary | ICD-10-CM | POA: Diagnosis not present

## 2024-01-11 DIAGNOSIS — D61818 Other pancytopenia: Secondary | ICD-10-CM | POA: Diagnosis not present

## 2024-01-11 DIAGNOSIS — R2689 Other abnormalities of gait and mobility: Secondary | ICD-10-CM | POA: Diagnosis not present

## 2024-01-11 DIAGNOSIS — F32A Depression, unspecified: Secondary | ICD-10-CM | POA: Diagnosis not present

## 2024-01-11 DIAGNOSIS — M6281 Muscle weakness (generalized): Secondary | ICD-10-CM | POA: Diagnosis not present

## 2024-01-11 DIAGNOSIS — S3282XD Multiple fractures of pelvis without disruption of pelvic ring, subsequent encounter for fracture with routine healing: Secondary | ICD-10-CM | POA: Diagnosis not present

## 2024-01-15 DIAGNOSIS — J449 Chronic obstructive pulmonary disease, unspecified: Secondary | ICD-10-CM | POA: Diagnosis not present

## 2024-01-15 DIAGNOSIS — D61818 Other pancytopenia: Secondary | ICD-10-CM | POA: Diagnosis not present

## 2024-01-15 DIAGNOSIS — F32A Depression, unspecified: Secondary | ICD-10-CM | POA: Diagnosis not present

## 2024-01-15 DIAGNOSIS — M6281 Muscle weakness (generalized): Secondary | ICD-10-CM | POA: Diagnosis not present

## 2024-01-15 DIAGNOSIS — S32050D Wedge compression fracture of fifth lumbar vertebra, subsequent encounter for fracture with routine healing: Secondary | ICD-10-CM | POA: Diagnosis not present

## 2024-01-15 DIAGNOSIS — S52122D Displaced fracture of head of left radius, subsequent encounter for closed fracture with routine healing: Secondary | ICD-10-CM | POA: Diagnosis not present

## 2024-01-15 DIAGNOSIS — I480 Paroxysmal atrial fibrillation: Secondary | ICD-10-CM | POA: Diagnosis not present

## 2024-01-15 DIAGNOSIS — S3282XD Multiple fractures of pelvis without disruption of pelvic ring, subsequent encounter for fracture with routine healing: Secondary | ICD-10-CM | POA: Diagnosis not present

## 2024-01-15 DIAGNOSIS — R2689 Other abnormalities of gait and mobility: Secondary | ICD-10-CM | POA: Diagnosis not present

## 2024-01-18 DIAGNOSIS — S32050D Wedge compression fracture of fifth lumbar vertebra, subsequent encounter for fracture with routine healing: Secondary | ICD-10-CM | POA: Diagnosis not present

## 2024-01-18 DIAGNOSIS — I480 Paroxysmal atrial fibrillation: Secondary | ICD-10-CM | POA: Diagnosis not present

## 2024-01-18 DIAGNOSIS — D61818 Other pancytopenia: Secondary | ICD-10-CM | POA: Diagnosis not present

## 2024-01-18 DIAGNOSIS — J449 Chronic obstructive pulmonary disease, unspecified: Secondary | ICD-10-CM | POA: Diagnosis not present

## 2024-01-18 DIAGNOSIS — R2689 Other abnormalities of gait and mobility: Secondary | ICD-10-CM | POA: Diagnosis not present

## 2024-01-18 DIAGNOSIS — F32A Depression, unspecified: Secondary | ICD-10-CM | POA: Diagnosis not present

## 2024-01-18 DIAGNOSIS — S52122D Displaced fracture of head of left radius, subsequent encounter for closed fracture with routine healing: Secondary | ICD-10-CM | POA: Diagnosis not present

## 2024-01-18 DIAGNOSIS — M6281 Muscle weakness (generalized): Secondary | ICD-10-CM | POA: Diagnosis not present

## 2024-01-18 DIAGNOSIS — S3282XD Multiple fractures of pelvis without disruption of pelvic ring, subsequent encounter for fracture with routine healing: Secondary | ICD-10-CM | POA: Diagnosis not present

## 2024-01-22 DIAGNOSIS — S3282XD Multiple fractures of pelvis without disruption of pelvic ring, subsequent encounter for fracture with routine healing: Secondary | ICD-10-CM | POA: Diagnosis not present

## 2024-01-22 DIAGNOSIS — S52122D Displaced fracture of head of left radius, subsequent encounter for closed fracture with routine healing: Secondary | ICD-10-CM | POA: Diagnosis not present

## 2024-01-22 DIAGNOSIS — J449 Chronic obstructive pulmonary disease, unspecified: Secondary | ICD-10-CM | POA: Diagnosis not present

## 2024-01-22 DIAGNOSIS — I480 Paroxysmal atrial fibrillation: Secondary | ICD-10-CM | POA: Diagnosis not present

## 2024-01-22 DIAGNOSIS — S32050D Wedge compression fracture of fifth lumbar vertebra, subsequent encounter for fracture with routine healing: Secondary | ICD-10-CM | POA: Diagnosis not present

## 2024-01-22 DIAGNOSIS — M6281 Muscle weakness (generalized): Secondary | ICD-10-CM | POA: Diagnosis not present

## 2024-01-22 DIAGNOSIS — R2689 Other abnormalities of gait and mobility: Secondary | ICD-10-CM | POA: Diagnosis not present

## 2024-01-22 DIAGNOSIS — F32A Depression, unspecified: Secondary | ICD-10-CM | POA: Diagnosis not present

## 2024-01-22 DIAGNOSIS — D61818 Other pancytopenia: Secondary | ICD-10-CM | POA: Diagnosis not present

## 2024-01-24 DIAGNOSIS — E559 Vitamin D deficiency, unspecified: Secondary | ICD-10-CM | POA: Diagnosis not present

## 2024-01-24 DIAGNOSIS — N3000 Acute cystitis without hematuria: Secondary | ICD-10-CM | POA: Diagnosis not present

## 2024-01-24 DIAGNOSIS — R251 Tremor, unspecified: Secondary | ICD-10-CM | POA: Diagnosis not present

## 2024-01-24 DIAGNOSIS — R413 Other amnesia: Secondary | ICD-10-CM | POA: Diagnosis not present

## 2024-01-25 ENCOUNTER — Telehealth: Payer: Self-pay

## 2024-01-25 NOTE — Transitions of Care (Post Inpatient/ED Visit) (Signed)
 01/25/2024  Name: Anita Harrell MRN: [id number] DOB: May 31, 1950  Today's TOC FU Call Status: Today's TOC FU Call Status:: Successful TOC FU Call Completed TOC FU Call Complete Date: 01/25/24  Patient's Name and Date of Birth confirmed. Name, DOB  Transition Care Management Follow-up Telephone Call Date of Discharge: 01/24/24 Discharge Facility: Other [facility]) Name of Other ([facility]) Discharge Facility: [facility] Type of Discharge: Inpatient Admission Primary Inpatient Discharge Diagnosis:: fracture of pelvis How have you been since you were released from the hospital?: Better Any questions or concerns?: No  Items Reviewed: Did you receive and understand the discharge instructions provided?: Yes Medications obtained,verified, and reconciled?: Yes (Medications Reviewed) Any new allergies since your discharge?: No Dietary orders reviewed?: Yes Do you have support at home?: Yes People in Home [RPT]: spouse  Medications Reviewed Today: Medications Reviewed Today     Reviewed by Emmitt Pan, LPN (Licensed Practical Nurse) on 01/25/24 at 1013  Med List Status: <None>   Medication Order Taking? Sig Documenting Provider Last Dose Status Informant  acetaminophen  (TYLENOL ) 500 MG tablet [id number] Yes Take 500 mg by mouth in the morning and at bedtime. [provider]  Active Spouse/Significant Other, Pharmacy Records  albuterol  (VENTOLIN  HFA) 108 [phone] Base) MCG/ACT inhaler [id number] Yes Inhale 2 puffs into the lungs every 6 (six) hours as needed for wheezing or shortness of breath. Tamea Dedra CROME, MD  Active Spouse/Significant Other, Pharmacy Records           Med Note GERI JON HERO   Sat Dec 15, 2023  8:23 AM)    apixaban  (ELIQUIS ) 5 MG TABS tablet [id number] Yes Take 5 mg by mouth 2 (two) times daily. [provider]  Active Spouse/Significant Other, Pharmacy Records  cyclobenzaprine  (FLEXERIL ) 10 MG tablet [id number] Yes TAKE ONE HALF  (1/2) TO ONE TABLET BY MOUTH AT BEDTIME AS NEEDED FOR MUSCLE SPASMS Tower, [name], MD  Active Spouse/Significant Other, Pharmacy Records  diazepam (VALIUM) 5 MG tablet [id number] Yes Take 1 tablet (5 mg total) by mouth in the morning and at bedtime. Gherghe, Costin M, MD  Active   donepezil (ARICEPT) 5 MG tablet [id number] Yes Take 5 mg by mouth daily. [provider]  Active Spouse/Significant Other, Pharmacy Records  DULoxetine  (CYMBALTA ) 60 MG capsule [id number] Yes Take 120 mg by mouth daily. [provider]  Active Spouse/Significant Other, Pharmacy Records  flecainide  (TAMBOCOR ) 100 MG tablet [id number] Yes Take 1 tablet (100 mg total) by mouth 2 (two) times daily. Gollan, Timothy J, MD  Active Spouse/Significant Other, Pharmacy Records  Fluticasone -Umeclidin-Vilant (TRELEGY ELLIPTA ) 100-62.5-25 MCG/ACT AEPB [id number] Yes Inhale 1 puff into the lungs daily. [provider]  Active Spouse/Significant Other, Pharmacy Records  furosemide  (LASIX ) 20 MG tablet [id number] Yes TAKE ONE TABLET (20 MG TOTAL) BY MOUTH DAILY. Gollan, Timothy J, MD  Active Spouse/Significant Other, Pharmacy Records  hydrOXYzine (VISTARIL) 25 MG capsule [id number] Yes Take 25 mg by mouth 2 (two) times daily. Doles-Johnson, Teah, NP  Active Spouse/Significant Other, Pharmacy Records           Med Note [facility], RAQUEL   Thu Jun 01, 2022  9:12 AM)    lidocaine  (LIDODERM ) 5 % [id number] Yes Place 1 patch onto the skin daily. Remove & Discard patch within 12 hours or as directed by MD Trixie Nilda HERO, MD  Active   Melatonin 5 MG CAPS [id number] Yes Take 10 mg by mouth at bedtime. [provider]  Active Spouse/Significant Other, Pharmacy Records  metoprolol   succinate (TOPROL -XL) 50 MG 24 hr tablet [id number] Yes TAKE ONE TABLET BY MOUTH TWICE A DAY WITH MEALS Gollan, Timothy J, MD  Active   oxyCODONE  10 MG TABS [id number] Yes Take 1 tablet (10 mg total) by mouth every 4 (four) hours as needed  for severe pain (pain score 7-10) or breakthrough pain. Gherghe, Costin M, MD  Active   PARoxetine  (PAXIL ) 10 MG tablet [id number] Yes Take 1 tablet by mouth daily. Doles-Johnson, Teah, NP  Active Spouse/Significant Other, Pharmacy Records  potassium chloride  SA (KLOR-CON  M) 20 MEQ tablet [id number] Yes Take 1 tablet (20 mEq total) by mouth daily.  Patient taking differently: Take 20 mEq by mouth 2 (two) times daily.   Tower, [name], MD  Active Spouse/Significant Other, Pharmacy Records           Med Note GERI JON HERO   Sat Dec 15, 2023  8:55 AM) Spouse confirms the patient is taking 1 full tablet BID.   rosuvastatin  (CRESTOR ) 10 MG tablet [id number] Yes TAKE ONE TABLET BY MOUTH ONCE A DAY Tower, [name], MD  Active Spouse/Significant Other, Pharmacy Records  traZODone  (DESYREL ) 100 MG tablet [id number] Yes Take 200 mg by mouth at bedtime. [provider]  Active Spouse/Significant Other, Pharmacy Records            Home Care and Equipment/Supplies: Were Home Health Services Ordered?: Yes Name of Home Health Agency:: unknown Has Agency set up a time to come to your home?: No Any new equipment or medical supplies ordered?: NA  Functional Questionnaire: Do you need assistance with bathing/showering or dressing?: No Do you need assistance with meal preparation?: No Do you need assistance with eating?: No Do you have difficulty maintaining continence: No Do you need assistance with getting out of bed/getting out of a chair/moving?: No Do you have difficulty managing or taking your medications?: No  Follow up appointments reviewed: PCP Follow-up appointment confirmed?: Yes Date of PCP follow-up appointment?: 01/29/24 Follow-up Provider: [facility] Follow-up appointment confirmed?: NA Do you need transportation to your follow-up appointment?: No Do you understand care options if your condition(s) worsen?: Yes-patient verbalized understanding    SIGNATURE  Julian Lemmings, LPN [facility] Nurse Health Advisor Direct Dial [phone]

## 2024-01-28 ENCOUNTER — Telehealth: Payer: Self-pay | Admitting: *Deleted

## 2024-01-28 NOTE — Telephone Encounter (Signed)
 FYI to PCP

## 2024-01-28 NOTE — Telephone Encounter (Signed)
Aware, thanks!

## 2024-01-28 NOTE — Telephone Encounter (Signed)
 Copied from CRM [id number]. Topic: General - Other >> Jan 28, 2024  4:25 PM China J wrote: Reason for CRM: Dorthea calling with [facility] to let Dr. Randeen know that start of care will be on Wednesday due to delay of care.  Please call Cindy at [phone] for any follow-up.

## 2024-01-29 ENCOUNTER — Encounter: Payer: Self-pay | Admitting: Family Medicine

## 2024-01-29 ENCOUNTER — Ambulatory Visit: Payer: Self-pay | Admitting: Family Medicine

## 2024-01-29 ENCOUNTER — Ambulatory Visit (INDEPENDENT_AMBULATORY_CARE_PROVIDER_SITE_OTHER): Admitting: Family Medicine

## 2024-01-29 VITALS — BP 104/66 | HR 66 | Temp 97.2°F | Ht 65.0 in | Wt 172.5 lb

## 2024-01-29 DIAGNOSIS — D696 Thrombocytopenia, unspecified: Secondary | ICD-10-CM | POA: Diagnosis not present

## 2024-01-29 DIAGNOSIS — S52122S Displaced fracture of head of left radius, sequela: Secondary | ICD-10-CM

## 2024-01-29 DIAGNOSIS — S52022S Displaced fracture of olecranon process without intraarticular extension of left ulna, sequela: Secondary | ICD-10-CM | POA: Diagnosis not present

## 2024-01-29 DIAGNOSIS — F322 Major depressive disorder, single episode, severe without psychotic features: Secondary | ICD-10-CM | POA: Diagnosis not present

## 2024-01-29 DIAGNOSIS — F418 Other specified anxiety disorders: Secondary | ICD-10-CM

## 2024-01-29 DIAGNOSIS — I10 Essential (primary) hypertension: Secondary | ICD-10-CM

## 2024-01-29 DIAGNOSIS — R300 Dysuria: Secondary | ICD-10-CM

## 2024-01-29 DIAGNOSIS — R443 Hallucinations, unspecified: Secondary | ICD-10-CM | POA: Diagnosis not present

## 2024-01-29 DIAGNOSIS — D72829 Elevated white blood cell count, unspecified: Secondary | ICD-10-CM

## 2024-01-29 DIAGNOSIS — S3282XD Multiple fractures of pelvis without disruption of pelvic ring, subsequent encounter for fracture with routine healing: Secondary | ICD-10-CM | POA: Diagnosis not present

## 2024-01-29 DIAGNOSIS — E876 Hypokalemia: Secondary | ICD-10-CM

## 2024-01-29 DIAGNOSIS — R296 Repeated falls: Secondary | ICD-10-CM

## 2024-01-29 DIAGNOSIS — S32000A Wedge compression fracture of unspecified lumbar vertebra, initial encounter for closed fracture: Secondary | ICD-10-CM

## 2024-01-29 DIAGNOSIS — Z23 Encounter for immunization: Secondary | ICD-10-CM | POA: Diagnosis not present

## 2024-01-29 LAB — CBC WITH DIFFERENTIAL/PLATELET
Basophils Absolute: 0.1 K/uL (ref 0.0–0.1)
Basophils Relative: 0.5 % (ref 0.0–3.0)
Eosinophils Absolute: 0.1 K/uL (ref 0.0–0.7)
Eosinophils Relative: 0.6 % (ref 0.0–5.0)
HCT: 38 % (ref 36.0–46.0)
Hemoglobin: 12.6 g/dL (ref 12.0–15.0)
Lymphocytes Relative: 9.8 % — ABNORMAL LOW (ref 12.0–46.0)
Lymphs Abs: 1.6 K/uL (ref 0.7–4.0)
MCHC: 33.2 g/dL (ref 30.0–36.0)
MCV: 89.1 fl (ref 78.0–100.0)
Monocytes Absolute: 3.8 K/uL — ABNORMAL HIGH (ref 0.1–1.0)
Monocytes Relative: 23.4 % — ABNORMAL HIGH (ref 3.0–12.0)
Neutro Abs: 10.7 K/uL — ABNORMAL HIGH (ref 1.4–7.7)
Neutrophils Relative %: 65.7 % (ref 43.0–77.0)
Platelets: 90 K/uL — ABNORMAL LOW (ref 150.0–400.0)
RBC: 4.27 Mil/uL (ref 3.87–5.11)
RDW: 14 % (ref 11.5–15.5)
WBC: 16.3 K/uL — ABNORMAL HIGH (ref 4.0–10.5)

## 2024-01-29 LAB — POC URINALSYSI DIPSTICK (AUTOMATED)
Bilirubin, UA: NEGATIVE
Blood, UA: NEGATIVE
Glucose, UA: NEGATIVE
Ketones, UA: NEGATIVE
Leukocytes, UA: NEGATIVE
Nitrite, UA: NEGATIVE
Protein, UA: NEGATIVE
Spec Grav, UA: 1.02 (ref 1.010–1.025)
Urobilinogen, UA: 0.2 U/dL
pH, UA: 6 (ref 5.0–8.0)

## 2024-01-29 LAB — BASIC METABOLIC PANEL WITH GFR
BUN: 6 mg/dL (ref 6–23)
CO2: 27 meq/L (ref 19–32)
Calcium: 8.8 mg/dL (ref 8.4–10.5)
Chloride: 102 meq/L (ref 96–112)
Creatinine, Ser: 0.5 mg/dL (ref 0.40–1.20)
GFR: 92.08 mL/min (ref >60.00)
Glucose, Bld: 101 mg/dL — ABNORMAL HIGH (ref 70–99)
Potassium: 3.3 meq/L — ABNORMAL LOW (ref 3.5–5.1)
Sodium: 139 meq/L (ref 135–145)

## 2024-01-29 NOTE — Assessment & Plan Note (Signed)
 Improved/resolved after taken off her paxil  and klonopin   Reassuring Followed by psychiatry and neurology

## 2024-01-29 NOTE — Assessment & Plan Note (Signed)
 At home Arm and pelvis fractures Reviewed hospital records, lab results and studies in detail    Is hope from rehab [facility] to start tomorrow for home therapy

## 2024-01-29 NOTE — Progress Notes (Signed)
 Subjective:    Patient ID: Anita Harrell, female    DOB: [birth year], 73 y.o.   MRN: [id number]  HPI  Wt Readings from Last 3 Encounters:  01/29/24 172 lb 8 oz (78.2 kg)  12/19/23 177 lb 0.5 oz (80.3 kg)  09/06/23 173 lb (78.5 kg)   28.71 kg/m  Vitals:   01/29/24 1109  BP: 104/66  Pulse: 66  Temp: (!) 97.2 F (36.2 C)  SpO2: 94%    Pt presents for follow up of  Rehab stay after hospitalization in October  Starting [facility] this week   Fall at home  Fractured  Left radial head and olcrenon process   Left pubic rami and sacral - with hematoma   L5 superior end plate fractures -minimal height loss   Seeing orthopedics and sport med    Going to neuro for memory changes  and tremor  Improved after d/c xanax , clonazepam, paxil  (had hallucination)  Some concern of lewy body dementia but she did improve  Taking aricept 5 mg daily Was treatment fo uti with macrobid   Taking cardiodopa/levodopa -to start after uti was gone so has not started yet   Mental health  Cymbalta  120 mg daily No longer on paxil   Still on valium - would rather take as needed/will discuss with psychiatrist   Recent uti  Took macrobid   Still some burning to urinate   Drinks lots of fluid    Lab Results  Component Value Date   NA 142 12/18/2023   K 3.8 12/18/2023   CO2 30 12/18/2023   GLUCOSE 101 (H) 12/18/2023   BUN 6 (L) 12/18/2023   CREATININE 0.54 12/18/2023   CALCIUM  9.1 12/18/2023   GFR 92.86 07/31/2023   GFRNONAA >60 12/18/2023   Lab Results  Component Value Date   ALT 28 12/14/2023   AST 25 12/14/2023   ALKPHOS 75 12/14/2023   BILITOT 0.9 12/14/2023   Lab Results  Component Value Date   WBC 12.1 (H) 12/19/2023   HGB 11.1 (L) 12/19/2023   HCT 32.9 (L) 12/19/2023   MCV 89.4 12/19/2023   PLT 81 (L) 12/19/2023    Overall was doing better in rehab  Then got home and feels very tired- sleeping too much? Not doing her rehab Home care comes on Wednesday   Is scared  she will fall again  Has walker at home, uses wheelchair more than that  Can walk 5-6 feet with walker   Ortho released her  Told to advance activity as tolerated   Not hungry Eating some soup   Results for orders placed or performed in visit on 01/29/24  POCT Urinalysis Dipstick (Automated)   Collection Time: 01/29/24 12:02 PM  Result Value Ref Range   Color, UA Yellow    Clarity, UA Clear    Glucose, UA Negative Negative   Bilirubin, UA Negative    Ketones, UA Negative    Spec Grav, UA 1.020 1.010 - 1.025   Blood, UA Negative    pH, UA 6.0 5.0 - 8.0   Protein, UA Negative Negative   Urobilinogen, UA 0.2 0.2 or 1.0 E.U./dL   Nitrite, UA Negative    Leukocytes, UA Negative Negative      Patient Active Problem List   Diagnosis Date Noted   Dysuria 01/29/2024   Moderately severe major depression (HCC) 01/29/2024   Multiple closed pelvic fractures with disruption of pelvic circle, initial encounter (HCC) 12/16/2023   Multiple closed pelvic fractures without disruption of  pelvic circle (HCC) 12/15/2023   Closed fracture of head of left radius 12/15/2023   Closed fracture of left olecranon process 12/15/2023   Lumbar compression fracture, closed, initial encounter (HCC) 12/15/2023   OSA (obstructive sleep apnea) 07/31/2023   Osteopenia 07/31/2023   COPD (chronic obstructive pulmonary disease) (HCC) 04/03/2023   Hypophosphatemia 11/19/2022   Metabolic acidosis 11/19/2022   Chronic heart failure with preserved ejection fraction (HFpEF) (HCC) 11/18/2022   Paroxysmal atrial fibrillation (HCC) 11/18/2022   Thrombocytopenia 11/18/2022   Hallucinations 05/17/2022   Polypharmacy 05/17/2022   General weakness 02/16/2022   Poor balance 02/16/2022   Falling 02/16/2022   Hypokalemia 02/13/2022   HTN (hypertension) 02/13/2022   DDD (degenerative disc disease), lumbar 12/26/2021   Atrial fibrillation with RVR (HCC) 10/05/2021   Estrogen deficiency 06/07/2021   Overactive bladder  10/17/2019   Medicare annual wellness visit, subsequent 01/16/2019   Thyroid  nodule 05/01/2018   Electronic cigarette use 04/03/2018   Obesity (BMI 30-39.9) 04/03/2018   Lung mass 02/01/2018   Smokers' cough (HCC) 01/01/2018   Microscopic hematuria 08/13/2012   Routine general medical examination at a health care facility 07/22/2012   Leukocytosis 03/30/2010   Prediabetes 02/21/2008   Allergic rhinitis 07/25/2007   Low back pain 06/26/2007   Personal history of goiter 07/27/2006   Hyperlipidemia 07/27/2006   PANIC DISORDER 07/27/2006   Former smoker 07/27/2006   Depression with anxiety 07/27/2006   Insomnia 07/27/2006   History of colonic polyps 06/09/2004   Past Medical History:  Diagnosis Date   Allergy    allergic rhinitis   Anxiety    Arrhythmia    atrial fibrillation   Arthritis    Back pain    Chest pain    CHF (congestive heart failure) (HCC)    COPD (chronic obstructive pulmonary disease) (HCC)    Depression    Emphysema of lung (HCC)    Epigastric pain    GERD (gastroesophageal reflux disease)    Goiter    History of tobacco abuse    Hyperglycemia    mild   Hyperlipidemia    Hypertension    Insomnia    Hx of   Labile blood pressure    Panic disorder    History of   Personal history of colonic polyps 06/09/2004   SVD (spontaneous vaginal delivery)    x 2   Past Surgical History:  Procedure Laterality Date   ABDOMINAL HYSTERECTOMY N/A 09/24/2012   Procedure: HYSTERECTOMY ABDOMINAL;  Surgeon: Peggye Gull, MD;  Location: [facility];  Service: Gynecology;  Laterality: N/A;   BREAST BIOPSY Right 2016   CARDIOVERSION N/A 02/09/2022   Procedure: CARDIOVERSION;  Surgeon: Perla Evalene PARAS, MD;  Location: [facility];  Service: Cardiovascular;  Laterality: N/A;   CARDIOVERSION N/A 03/02/2022   Procedure: CARDIOVERSION;  Surgeon: Perla Evalene PARAS, MD;  Location: [facility] OR[facility];  Service: Cardiovascular;  Laterality: N/A;   COLONOSCOPY     COLONOSCOPY  2017   ELBOW  SURGERY  2004   EYE SURGERY     bilateral lasik   SALPINGOOPHORECTOMY Bilateral 09/24/2012   Procedure: SALPINGO OOPHORECTOMY;  Surgeon: Peggye Gull, MD;  Location: [facility];  Service: Gynecology;  Laterality: Bilateral;   THYROID  SURGERY     B9 massess   WART FULGURATION N/A 09/24/2012   Procedure: FULGURATION VAGINAL WART;  Surgeon: Peggye Gull, MD;  Location: [facility];  Service: Gynecology;  Laterality: N/A;   WISDOM TOOTH EXTRACTION     Social History   Tobacco Use  Smoking status: Former    Current packs/day: 0.00    Types: E-cigarettes, Cigarettes    Quit date: 2011    Years since quitting: 14.8   Smokeless tobacco: Never  Vaping Use   Vaping status: Former   Quit date: 11/18/2022  Substance Use Topics   Alcohol use: No    Alcohol/week: 0.0 standard drinks of alcohol   Drug use: No   Family History  Problem Relation Age of Onset   Hypertension Mother    Stroke Mother    Alcohol abuse Father    Cancer Father        lung CA   Heart disease Father        CHF   Cancer Sister        breast CA   Heart disease Sister        CHF from chemotx also has defib   Breast cancer Sister    Colon cancer Neg Hx    Esophageal cancer Neg Hx    Rectal cancer Neg Hx    Stomach cancer Neg Hx    Allergies  Allergen Reactions   Abilify [Aripiprazole] Other (See Comments)    Vision problems   Ambien  [Zolpidem ] Other (See Comments)    Hallucinations MS change    Codeine Nausea And Vomiting    Patient able to tolerate hydrocodone  and oxycodone    Lipitor [Atorvastatin] Other (See Comments)    Myalgias    Vesicare  [Solifenacin ] Other (See Comments)    MS change   Wellbutrin  [Bupropion ] Other (See Comments)    Unknown reaction   Current Outpatient Medications on File Prior to Visit  Medication Sig Dispense Refill   acetaminophen  (TYLENOL ) 500 MG tablet Take 500 mg by mouth in the morning and at bedtime.     albuterol  (VENTOLIN  HFA) 108 (90 Base) MCG/ACT inhaler Inhale 2 puffs into  the lungs every 6 (six) hours as needed for wheezing or shortness of breath. 8 g 11   apixaban  (ELIQUIS ) 5 MG TABS tablet Take 5 mg by mouth 2 (two) times daily.     cyclobenzaprine  (FLEXERIL ) 10 MG tablet TAKE ONE HALF (1/2) TO ONE TABLET BY MOUTH AT BEDTIME AS NEEDED FOR MUSCLE SPASMS 30 tablet 0   diazepam (VALIUM) 5 MG tablet Take 1 tablet (5 mg total) by mouth in the morning and at bedtime. 10 tablet 0   donepezil (ARICEPT) 5 MG tablet Take 5 mg by mouth daily.     DULoxetine  (CYMBALTA ) 60 MG capsule Take 120 mg by mouth daily.     flecainide  (TAMBOCOR ) 100 MG tablet Take 1 tablet (100 mg total) by mouth 2 (two) times daily. 180 tablet 3   Fluticasone -Umeclidin-Vilant (TRELEGY ELLIPTA ) 100-62.5-25 MCG/ACT AEPB Inhale 1 puff into the lungs daily.     furosemide  (LASIX ) 20 MG tablet TAKE ONE TABLET (20 MG TOTAL) BY MOUTH DAILY. 90 tablet 3   hydrOXYzine (VISTARIL) 25 MG capsule Take 25 mg by mouth 2 (two) times daily.     lidocaine  (LIDODERM ) 5 % Place 1 patch onto the skin daily. Remove & Discard patch within 12 hours or as directed by MD 10 patch 0   Melatonin 5 MG CAPS Take 10 mg by mouth at bedtime.     metoprolol  succinate (TOPROL -XL) 50 MG 24 hr tablet TAKE ONE TABLET BY MOUTH TWICE A DAY WITH MEALS 180 tablet 0   oxyCODONE  10 MG TABS Take 1 tablet (10 mg total) by mouth every 4 (four) hours as needed for severe  pain (pain score 7-10) or breakthrough pain. 10 tablet 0   potassium chloride  SA (KLOR-CON  M) 20 MEQ tablet Take 1 tablet (20 mEq total) by mouth daily. (Patient taking differently: Take 20 mEq by mouth 2 (two) times daily.) 90 tablet 1   rosuvastatin  (CRESTOR ) 10 MG tablet TAKE ONE TABLET BY MOUTH ONCE A DAY 90 tablet 1   traZODone  (DESYREL ) 100 MG tablet Take 200 mg by mouth at bedtime.     VITAMIN D PO Take 1 capsule by mouth daily.     No current facility-administered medications on file prior to visit.    Review of Systems  Constitutional:  Positive for fatigue. Negative  for activity change, appetite change, fever and unexpected weight change.  HENT:  Negative for congestion, ear pain, rhinorrhea, sinus pressure and sore throat.   Eyes:  Negative for pain, redness and visual disturbance.  Respiratory:  Negative for cough, shortness of breath and wheezing.   Cardiovascular:  Negative for chest pain and palpitations.  Gastrointestinal:  Negative for abdominal pain, blood in stool, constipation and diarrhea.  Endocrine: Negative for polydipsia and polyuria.  Genitourinary:  Negative for dysuria, frequency and urgency.  Musculoskeletal:  Positive for arthralgias and back pain. Negative for myalgias.  Skin:  Negative for pallor and rash.  Allergic/Immunologic: Negative for environmental allergies.  Neurological:  Negative for dizziness, syncope and headaches.  Hematological:  Negative for adenopathy. Does not bruise/bleed easily.  Psychiatric/Behavioral:  Positive for dysphoric mood. Negative for decreased concentration. The patient is nervous/anxious.        Objective:   Physical Exam Constitutional:      General: She is not in acute distress.    Appearance: Normal appearance. She is well-developed and normal weight. She is not ill-appearing or diaphoretic.  HENT:     Head: Normocephalic and atraumatic.  Eyes:     Conjunctiva/sclera: Conjunctivae normal.     Pupils: Pupils are equal, round, and reactive to light.  Neck:     Thyroid : No thyromegaly.     Vascular: No carotid bruit or JVD.  Cardiovascular:     Rate and Rhythm: Normal rate and regular rhythm.     Heart sounds: Normal heart sounds.     No gallop.  Pulmonary:     Effort: Pulmonary effort is normal. No respiratory distress.     Breath sounds: Normal breath sounds. No wheezing or rales.  Abdominal:     General: There is no distension or abdominal bruit.     Palpations: Abdomen is soft.  Musculoskeletal:     Cervical back: Normal range of motion and neck supple.     Right lower leg: No  edema.     Left lower leg: No edema.     Comments: Rom is improved in LUE   LS tenderness  Pelvic tenderness     Lymphadenopathy:     Cervical: No cervical adenopathy.  Skin:    General: Skin is warm and dry.     Coloration: Skin is not pale.     Findings: No rash.  Neurological:     Mental Status: She is alert.     Coordination: Coordination normal.     Deep Tendon Reflexes: Reflexes are normal and symmetric. Reflexes normal.     Comments: In wheelchair  Unable to ambulate w/o assist   Psychiatric:        Attention and Perception: Attention normal.        Mood and Affect: Mood normal. Affect is blunt.  Comments: Affect more blunted than past Less anx   Candidly discusses symptoms and stressors             Assessment & Plan:   Problem List Items Addressed This Visit       Cardiovascular and Mediastinum   HTN (hypertension) - Primary   bp in fair control at this time  BP Readings from Last 1 Encounters:  01/29/24 104/66   No changes needed Most recent labs reviewed  Disc lifstyle change with low sodium diet and exercise   Continues Toprol  xl 50 mg bid Lasix  20 mg daily   Lab today  Not light headed but watching        Relevant Orders   Basic metabolic panel with GFR   CBC with Differential/Platelet     Musculoskeletal and Integument   Multiple closed pelvic fractures without disruption of pelvic circle [facility])   Reviewed hospital records, lab results and studies in detail  Gradually improving Ortho signed off  Out of rehab [facility] to start tomorrow PT will be most important        Lumbar compression fracture, closed, initial encounter (HCC)   From fall with fractured arm and pelvis also Reviewed hospital records, lab results and studies in detail    Ortho signed off  Improved   Due for dexa when she stabilizes       Closed fracture of left olecranon process   With radial fractures Improved See a/p for radial fracture      Closed  fracture of head of left radius   From fall Much improved Reviewed hospital records, lab results and studies in detail  Released from ortho  For PT- home from rehab and first [facility] tomorrow         Hematopoietic and Hemostatic   Thrombocytopenia   Cbc today  Plt 81 at discharge from hospital  No bleeding/bruising        Relevant Orders   CBC with Differential/Platelet     Other   Moderately severe major depression (HCC)   Continues psychiatric care Improved See a/p for dep with anx       Hallucinations   Improved/resolved after taken off her paxil  and klonopin   Reassuring Followed by psychiatry and neurology      Falling   At home Arm and pelvis fractures Reviewed hospital records, lab results and studies in detail    Is hope from rehab [facility] to start tomorrow for home therapy      Dysuria   Urinalysis clear today  S/p recent treatment with macrobid   Encouraged good fluid intake       Relevant Orders   POCT Urinalysis Dipstick (Automated) (Completed)   Depression with anxiety   Meds were reduced due to MS change and hallucinations Psychiatry care  Cymbalta  120 mg daily  Hydroxyzine prn Trazodone200 mg at bedtime Valium 5 mg bid -pt would like to change to bid and plans to d/w provider  Off paxil   Off klonopin   MS is better  Less anxious today       Other Visit Diagnoses       Need for influenza vaccination       Relevant Orders   Flu vaccine HIGH DOSE PF(Fluzone Trivalent) (Completed)

## 2024-01-29 NOTE — Assessment & Plan Note (Signed)
 Meds were reduced due to MS change and hallucinations Psychiatry care  Cymbalta  120 mg daily  Hydroxyzine prn Trazodone200 mg at bedtime Valium 5 mg bid -pt would like to change to bid and plans to d/w provider  Off paxil   Off klonopin   MS is better  Less anxious today

## 2024-01-29 NOTE — Patient Instructions (Addendum)
 Flu shot today   Get back on track with therapy   Urinalysis today  Lab today

## 2024-01-29 NOTE — Assessment & Plan Note (Addendum)
 Urinalysis clear today  S/p recent treatment with macrobid   Encouraged good fluid intake

## 2024-01-29 NOTE — Assessment & Plan Note (Addendum)
 From fall with fractured arm and pelvis also Reviewed hospital records, lab results and studies in detail    Ortho signed off  Improved   Due for dexa when she stabilizes

## 2024-01-29 NOTE — Assessment & Plan Note (Signed)
 From fall Much improved Reviewed hospital records, lab results and studies in detail  Released from ortho  For PT- home from rehab and first [facility] tomorrow

## 2024-01-29 NOTE — Assessment & Plan Note (Signed)
 Cbc today  Plt 81 at discharge from hospital  No bleeding/bruising

## 2024-01-29 NOTE — Assessment & Plan Note (Signed)
 bp in fair control at this time  BP Readings from Last 1 Encounters:  01/29/24 104/66   No changes needed Most recent labs reviewed  Disc lifstyle change with low sodium diet and exercise   Continues Toprol  xl 50 mg bid Lasix  20 mg daily   Lab today  Not light headed but watching

## 2024-01-29 NOTE — Assessment & Plan Note (Signed)
 With radial fractures Improved See a/p for radial fracture

## 2024-01-29 NOTE — Assessment & Plan Note (Signed)
 Continues psychiatric care Improved See a/p for dep with anx

## 2024-01-29 NOTE — Assessment & Plan Note (Signed)
 Reviewed hospital records, lab results and studies in detail  Gradually improving Ortho signed off  Out of rehab [facility] to start tomorrow PT will be most important

## 2024-01-31 NOTE — Progress Notes (Signed)
 Cardiology Clinic Note   Date: 02/04/2024 ID: Anita, Harrell 1950-07-16, MRN [id number]  Primary Cardiologist:  Evalene Lunger, MD  Chief Complaint   Anita Harrell is a 73 y.o. female who presents to the clinic today for routine follow up.  Patient Profile   Anita Harrell is followed by Dr. Timothy Harrell for the history outlined below.      Past medical history significant for: Coronary artery calcifications/aortic atherosclerosis. Chronic HFpEF. Echo 02/14/2022: EF 50 to 55%.  No RWMA.  Mild LVH.  Indeterminate diastolic parameters.  Normal RV size/function.  Normal PA pressure.  Trivial MR. PAF. Onset May 2023. Exercise stress test 12/21/2021: Intermediate risk study secondary to poor exercise tolerance with no EKG evidence of ischemia.  Patient exercised for 1 minute 34 seconds with 3.8 METS of activity, normal blood pressure response. 14-day ZIO 01/13/2022: 100% A-fib burden, HR of 60 to 158 bpm, average 96 bpm.  1 patient triggered event associated with A-fib. Hyperlipidemia. Lipid panel 07/24/2023: LDL 53, HDL 39, TG 151, total 122. OSA. CPAP non-adherent COPD. Former tobacco abuse (smoking and vaping).  In summary, patient was first evaluated by Dr. Gollan on 07/27/2021 for new onset A-fib.  She had been evaluated in the pulmonary clinic the day prior and noted to be in A-fib with elevated heart rate.  [name] in pulmonology consulted cardiology and started patient on Eliquis .  Patient reported fatigue and shortness of breath.  She had been having issues with dyspnea navigating stairs for quite some time.  She was deconditioned at baseline.  Repeat EKG at the time of her visit demonstrated A-fib, HR 114 bpm.  She was instructed to increase Toprol  and hold lisinopril  HCTZ to avoid hypotension.  Echo June 2023 demonstrated EF 55 to 60%, no RWMA, indeterminate diastolic parameters, normal RV size/function, moderate LAE, mild MR.  She was started on flecainide  in  October 2023.  She underwent exercise stress test which was an intermediate risk secondary to poor exercise tolerance as detailed above.  14-day ZIO showed 100% A-fib burden.  She presented to the hospital for cardioversion in late November 2023 and was found to be in sinus rhythm.  Patient underwent hospital admission in early December 2023 with acute respiratory failure with hypoxia in the setting of COPD exacerbation and acute on chronic HFpEF.  Troponin negative x 2, BNP 128, D-dimer negative.  Echo showed normal LV/RV function as detailed above.  EKG demonstrated sinus rhythm with first-degree AV block.  She required supplemental O2 while hospitalized and was weaned prior to discharge.  She was treated with steroids, nebulizer treatments, and diuresis with symptomatic improvement.  Upon follow-up in February 2024 patient continued to complain of shortness of breath.  She remains sedentary and admitted to eating more.  She noted increased lower extremity edema and was started on Lasix .  Patient admitted to the hospital 11/18/2022 to 11/25/2022 with COVID-pneumonia.  Upon arrival to the ED SpO2 84% on room air improved to 92% on 4 L of supplemental O2 and subsequently changed to heated high flow.  She had electrolyte derangement.  BNP 222.  She was treated with IV steroids, Rocephin , Zithromax , IV Lasix .  SpO2 continued to drop into the mid 80s with ambulation throughout hospital admission.  She was discharged on supplemental O2.  Diltiazem  was held during hospitalization and not resumed at discharge.  Patient was seen in the clinic on 12/19/2022 for hospital follow-up.  She continued to feel weak and was walking with  the assistance of a walker.  She was instructed to continue to hold diltiazem .  No other medication changes were made.   Patient was last seen in the office by me on 08/09/2023 for routine follow-up.  She reported occasional left greater than right lower extremity edema and was limiting her  sodium by using no salt products.  She reported being mostly adherent with CPAP and pulmonary plans on adding 2 L of O2 at night.  Her activity was limited by chronic back pain.  No medication changes were made.  Patient underwent hospital admission in early October 2025 after a mechanical fall landing on her left side and complained of left hip and elbow pain.  Unfortunately she suffered left radial head and olecranon process fractures as well as a left pubic rami and sacral fractures and L5 superior endplate fracture.  She underwent PT/OT during hospital admission.  She was discharged to a SNF.     History of Present Illness    Today, patient is doing well overall. She was discharged from SNF and is participating in home health PT and OT. This has been difficult due to chronic back pain. She is due to see ortho soon for injections. No chest pain, shortness of breath, dizziness, or palpitations. She recently stumped her left foot and it is swelling, worse now than this morning. Otherwise, she denies edema. She is not wearing her CPAP due to inability to tolerate mask. She has not been wearing supplemental oxygen  since her home pulse oximetry is reading > 90% when checked. She is taking potassium as prescribed, splitting the tablet and taking half in the morning and half in the evening. Her PCP has referred her to hematology for leukocytosis and thrombocytopenia.   ROS: All other systems reviewed and are otherwise negative except as noted in History of Present Illness.  EKGs/Labs Reviewed    12/14/2023: ALT 28; AST 25 01/29/2024: BUN 6; Creatinine, Ser 0.50; Potassium 3.3; Sodium 139   01/29/2024: Hemoglobin 12.6; WBC 16.3   07/24/2023: TSH 1.48   EKG: Patient with baseline tremor, artifact noted. NSR 73 bpm. Incomplete RBBB. Unchanged from prior EKG.   Risk Assessment/Calculations   CHA2DS2-VASc Score = 3   This indicates a 3.2% annual risk of stroke. The patient's score is based upon: CHF  History: 1 HTN History: 0 Diabetes History: 0 Stroke History: 0 Vascular Disease History: 0 Age Score: 1 Gender Score: 1      Physical Exam    VS:  BP 118/65 (BP Location: Left Arm, Patient Position: Sitting, Cuff Size: Normal)   Pulse 73 Comment: 78 oximeter  Ht 5' 3 (1.6 m)   Wt 168 lb (76.2 kg)   SpO2 93%   BMI 29.76 kg/m  , BMI Body mass index is 29.76 kg/m.  GEN: Well nourished, well developed, in no acute distress. Neck: No JVD or carotid bruits. Cardiac: RRR. No murmur. No rubs or gallops.   Respiratory:  Respirations regular and unlabored. Clear to auscultation without rales, wheezing or rhonchi. GI: Soft, nontender, nondistended. Extremities: Radials/DP/PT 2+ and equal bilaterally. No clubbing or cyanosis. LLE traumatic edema Skin: Warm and dry, no rash, scattered ecchymosis  Neuro: Strength intact, baseline tremor  Assessment & Plan  Chronic HFpEF Echo December 2023 demonstrated normal LV/RV function, no RWMA, mild LVH, normal PA pressure, mild MR. She denies SOB. Patient appears euvolemic and well compensated on exam.  - Continue spironolactone , Toprol , Lasix .   PAF Onset May 2023.  Exercise stress test October  2023 was an intermediate risk study secondary to poor exercise tolerance, no EKG changes significant for ischemia.  14-day ZIO November 2023 showed 100% A-fib burden.  Patient was scheduled for cardioversion late November 2023 and found to be in sinus rhythm.  EKG shows NSR today.  No abnormal bleeding.  Patient denies palpitations.  - Continue Toprol , flecainide , Eliquis .   Hyperlipidemia LDL 53 May 2025, at goal. - Continue rosuvastatin .   OSA Patient not utilizing CPAP and rarely wearing supplemental O2.  - Encouraged compliance with CPAP and supplemental O2 as needed.  Hypokalemia K 3.3 01/29/24 (PCP) She is taking KCl supplementation as prescribed. - Recheck BMET today. - Discussed dietary supplementation.  Disposition: Follow-up in six  months or sooner as needed.      Signed, Anita GORMAN Cleaves, FNP-BC

## 2024-02-01 ENCOUNTER — Ambulatory Visit: Admitting: Student

## 2024-02-04 ENCOUNTER — Ambulatory Visit: Attending: Student

## 2024-02-04 ENCOUNTER — Encounter: Payer: Self-pay | Admitting: Student

## 2024-02-04 VITALS — BP 118/65 | HR 73 | Ht 63.0 in | Wt 168.0 lb

## 2024-02-04 DIAGNOSIS — E782 Mixed hyperlipidemia: Secondary | ICD-10-CM | POA: Diagnosis not present

## 2024-02-04 DIAGNOSIS — G4733 Obstructive sleep apnea (adult) (pediatric): Secondary | ICD-10-CM | POA: Diagnosis not present

## 2024-02-04 DIAGNOSIS — E876 Hypokalemia: Secondary | ICD-10-CM | POA: Insufficient documentation

## 2024-02-04 DIAGNOSIS — I5032 Chronic diastolic (congestive) heart failure: Secondary | ICD-10-CM

## 2024-02-04 DIAGNOSIS — I480 Paroxysmal atrial fibrillation: Secondary | ICD-10-CM | POA: Diagnosis not present

## 2024-02-04 NOTE — Patient Instructions (Signed)
 Medication Instructions:  Your physician recommends that you continue on your current medications as directed. Please refer to the Current Medication list given to you today.  *If you need a refill on your cardiac medications before your next appointment, please call your pharmacy*  Lab Work: Your provider would like for you to have following labs drawn today BMP.     Testing/Procedures: No test ordered today   Follow-Up: At [facility], you and your health needs are our priority.  As part of our continuing mission to provide you with exceptional heart care, our providers are all part of one team.  This team includes your primary Cardiologist (physician) and Advanced Practice Providers or APPs (Physician Assistants and Nurse Practitioners) who all work together to provide you with the care you need, when you need it.  Your next appointment:   6 month(s)  Provider:   Barnie Hila, NP

## 2024-02-05 ENCOUNTER — Ambulatory Visit: Payer: Self-pay | Admitting: Student

## 2024-02-05 LAB — BASIC METABOLIC PANEL WITH GFR
BUN/Creatinine Ratio: 11 — ABNORMAL LOW (ref 12–28)
BUN: 8 mg/dL (ref 8–27)
CO2: 22 mmol/L (ref 20–29)
Calcium: 9.4 mg/dL (ref 8.7–10.3)
Chloride: 100 mmol/L (ref 96–106)
Creatinine, Ser: 0.72 mg/dL (ref 0.57–1.00)
Glucose: 131 mg/dL — ABNORMAL HIGH (ref 70–99)
Potassium: 3.4 mmol/L — ABNORMAL LOW (ref 3.5–5.2)
Sodium: 141 mmol/L (ref 134–144)
eGFR: 88 mL/min/1.73 (ref >59)

## 2024-02-05 MED ORDER — POTASSIUM CHLORIDE CRYS ER 20 MEQ PO TBCR: 10.0000 meq | EXTENDED_RELEASE_TABLET | Freq: Three times a day (TID) | ORAL | 3 refills | Status: DC

## 2024-02-12 ENCOUNTER — Telehealth: Payer: Self-pay | Admitting: *Deleted

## 2024-02-12 NOTE — Telephone Encounter (Signed)
 Copied from CRM #[id number]. Topic: Clinical - Home Health Verbal Orders >> Feb 12, 2024  8:12 AM Mercedes MATSU wrote: Caller/Agency: [facility] ([facility]) Callback Number: [phone] (secure line, can leave voice) Service Requested: Physical Therapy Frequency: One week 9, in addition to that she is requesting speech therapy, occupational therapy, and home health aid. Any new concerns about the patient? Yes, Mercy states that she initially evaluated her on 11/21 and she was not doing so bad, she was able to walk to the bathroom. Yesterday 12/1 she was reevaluated and had a significant decline. Nurse angie states she can only walk 6 feet and needed help, had to sit. Also another therapist had to assist with sitting. She has edema on both feet 1+ and trace on the right, blood pressure was 16/90. Patient also took a extra lasix 's due to the swelling on both feet, and she is also displaying symptoms of depression score a 24 on her PHQ-9. Finished antibiotic for uti, a week ago patient declined in cognitive status she got a 8. Foul smelling urine as well.

## 2024-02-12 NOTE — Telephone Encounter (Signed)
 Please ok the verbal orders  Sounds like she may have a uti  Can they get a urine sample?  Can she come in for a visit with first available  Thanks for the heads up

## 2024-02-12 NOTE — Telephone Encounter (Signed)
 Left VM giving VO and asked if they could get a urine for UA/cx., I requested that she call me back if they are not able to complete the urinalysis.  Called pt directly to get her an appt. Pt declined to come in this week she has multiple appts scheduled. The earliest pt is willing to come in to get checked is 02/21/24 (appt scheduled). Pt said she feels the exact same as when she saw PCP last month and doesn't think she as worsened as back as home health stated but will still come in for an appt so PCP can check her out.   FYI to PCP

## 2024-02-12 NOTE — Telephone Encounter (Signed)
Aware, thanks!

## 2024-02-13 ENCOUNTER — Telehealth: Payer: Self-pay | Admitting: *Deleted

## 2024-02-13 NOTE — Telephone Encounter (Signed)
 Nurse from [facility] returned call. Transferred call to office.

## 2024-02-13 NOTE — Telephone Encounter (Signed)
 Thanks Keep appointment for next week  Can watch for cancellation also   Alert us  if symptoms worsen in meantime ER if anything severe

## 2024-02-13 NOTE — Telephone Encounter (Signed)
 Cardiology I think prescribes the flecainide  and the potassium Psychiatry the hydroxyzine Please forward this to them   Aware and will monitor

## 2024-02-13 NOTE — Telephone Encounter (Signed)
 Copied from CRM [id number]. Topic: Clinical - Medical Advice >> Feb 13, 2024  3:23 PM Mesmerise C wrote: Reason for CRM: Angie from [facility] entering meds into patients list got a flag for 2 meds altercation for flecainide  (TAMBOCOR ) 100 MG tablet interacts with hydrOXYzine (VISTARIL) 25 MG capsule and hydrOXYzine (VISTARIL) 25 MG capsule  interacts with potassium chloride  SA (KLOR-CON  M) 20 MEQ tablet

## 2024-02-13 NOTE — Telephone Encounter (Signed)
 Angie with home health returned my call. She said they are unable to get a UA because pt doesn't have nursing needs the are PT. She said pt just finished abx so not sure if PCP wants to have them drop off a urine same for us  to do a UA on or just wait until her appt next week but wanted to make sure PCP knew they can't process a UA on pt. Please advise

## 2024-02-14 ENCOUNTER — Ambulatory Visit: Admitting: Pulmonary Disease

## 2024-02-14 ENCOUNTER — Telehealth: Payer: Self-pay | Admitting: *Deleted

## 2024-02-14 ENCOUNTER — Encounter: Payer: Self-pay | Admitting: Pulmonary Disease

## 2024-02-14 ENCOUNTER — Ambulatory Visit: Payer: Self-pay

## 2024-02-14 VITALS — BP 104/64 | HR 68 | Temp 97.8°F | Ht 63.0 in | Wt 168.0 lb

## 2024-02-14 DIAGNOSIS — G4734 Idiopathic sleep related nonobstructive alveolar hypoventilation: Secondary | ICD-10-CM | POA: Diagnosis not present

## 2024-02-14 DIAGNOSIS — Z87891 Personal history of nicotine dependence: Secondary | ICD-10-CM

## 2024-02-14 DIAGNOSIS — J449 Chronic obstructive pulmonary disease, unspecified: Secondary | ICD-10-CM

## 2024-02-14 DIAGNOSIS — I480 Paroxysmal atrial fibrillation: Secondary | ICD-10-CM | POA: Diagnosis not present

## 2024-02-14 NOTE — Patient Instructions (Signed)
 VISIT SUMMARY:  Today, we discussed your progress following your recent fall and fractures. You are currently in rehabilitation and working on regaining your ability to walk. We also reviewed your COPD management, the swelling in your right foot, and your cognitive function.  YOUR PLAN:  -CHRONIC OBSTRUCTIVE PULMONARY DISEASE (COPD): COPD is a chronic lung condition that makes it hard to breathe. Your COPD is well-managed with Trelegy, and you are not experiencing any breathing difficulties. Continue using Trelegy as prescribed.  -PELVIC FRACTURE, STATUS POST RECENT INJURY: A pelvic fracture is a break in one or more of the bones in your pelvis. You are experiencing significant pain and difficulty walking. Continue with your rehabilitation program to help you recover.  -LEFT ARM FRACTURE, STATUS POST RECENT INJURY: A left arm fracture is a break in one of the bones in your left arm. You are experiencing significant pain and difficulty walking. Continue with your rehabilitation program to help you recover.  -RIGHT FOOT SWELLING FOLLOWING TRAUMA: Swelling in your right foot occurred after you hit your toe. The swelling has decreased and is improving. There is no indication of any lung-related issues.  -COGNITIVE IMPAIRMENT, IMPROVED: Cognitive impairment means having trouble with memory or thinking skills. Your memory has improved, and you are now able to perform tasks like writing checks. We will consider alternative medications if needed, as you did not tolerate carbidopa well.  INSTRUCTIONS:  Continue with your rehabilitation program for your pelvic and left arm fractures. Keep using Trelegy for your COPD as prescribed. If you notice any new symptoms or if your current symptoms worsen, please contact our office.

## 2024-02-14 NOTE — Telephone Encounter (Signed)
 Copied from CRM #[id number]. Topic: Clinical - Red Word Triage >> Feb 14, 2024  3:20 PM Robinson H wrote: [facility] that prompted transfer to Nurse Triage: Mardeen calling to order verbal OT orders and also wanted to report patient is having some depression issue and would like for office to call and check on patient. Stated she wanted to just report didn't want to be transferred.  Gaetana [phone]

## 2024-02-14 NOTE — Telephone Encounter (Signed)
 Routing to cardiology

## 2024-02-14 NOTE — Telephone Encounter (Signed)
 Pt wants to keep appt on appt date and will check urine at that OV

## 2024-02-14 NOTE — Telephone Encounter (Signed)
 See prev note, PT has also advise us  of pt's situation and a f/u with PCP has been scheduled and discussed with pt. I also left VM requesting pt to call us  back to check in on depression sxs

## 2024-02-14 NOTE — Telephone Encounter (Signed)
 3rd call back attempt, no answer. LVM to call clinic back at [phone].

## 2024-02-14 NOTE — Progress Notes (Signed)
 Subjective:    Patient ID: Anita Harrell, female    DOB: 01-07-1951, 73 y.o.   MRN: [id number]  Patient Care Team: Tower, [name], MD as PCP - General (Family Medicine) Perla Evalene JINNY, MD as PCP - Cardiology (Cardiology) Rox Charleston, MD as Consulting Physician (Obstetrics and Gynecology) Tamea Dedra CROME, MD as Consulting Physician (Pulmonary Disease) Doles-Johnson, Teah, NP as Nurse Practitioner (Psychiatry) Fate Morna SAILOR, [facility] (Inactive) as Pharmacist (Pharmacist) Babara Call, MD as Consulting Physician (Oncology)  Chief Complaint  Patient presents with   COPD    Shortness of breath on exertion.     BACKGROUND/INTERVAL:Patient is a 73 year old former smoker who discontinued use of vaping nicotine containing substance (October 2024), who presents for follow-up from her 06 September 2023 visit.  Recall she had a hospitalization at [facility] between 18 November 2022 through 25 November 2022 due to acute respiratory failure with hypoxia in the setting of pneumonia and COVID-19 infection.  She had abnormal CT scan findings due to that.  The patient resolved her acute respiratory failure symptoms however there was still concern with regards to her CT findings.  She had follow-up CT chest 29 January 2023 and this showed resolution of the previously noted right lower lobe masslike opacity and a persistent groundglass nodule within the right apex which measures 7 mm.  Patient has follow-up CT ordered.  She had a sleep study that showed mild sleep apnea with AHI of 8.7.  She does not tolerate CPAP.  She has had memory issues and a recent fall where she broke her pelvis.  She has been undergoing rehabilitation.  HPI Discussed the use of AI scribe software for clinical note transcription with the patient, who gave verbal consent to proceed.  History of Present Illness   LANECIA Harrell is a 73 year old female who presents for follow-up of mild COPD and dyspnea.  She sustained a fall resulting  in multiple pelvic fractures. She is accompanied by [name], her spouse.  She is currently in rehabilitation following a fall that resulted in a fractured pelvis in three places and a left arm fracture. She spent eight days in the hospital and has been in rehabilitation for thirty-four days. She experiences significant pain and difficulty walking, stating 'I just can't walk.' She is in the process of learning to walk again with the help of a therapist.  Regarding her COPD, her lungs have been 'good' and she feels her breathing is doing okay. She is currently using Trelegy, which she states is 'doing so good.'  She has been experiencing swelling in her right foot, which began after hitting her toe. The swelling has decreased but was initially more pronounced.  She has been trialed on carbidopa for Parkinson's disease, but it made her sick, and she has tried it twice without success. She notes that her memory has improved, allowing her to manage tasks such as writing checks, which she was unable to do before.  She does not endorse any other symptomatology.  She has a history of paroxysmal atrial fibrillation but has been controlled on her current therapy.  She follows with cardiology.    Review of Systems A 10 point review of systems was performed and it is as noted above otherwise negative.   Patient Active Problem List   Diagnosis Date Noted   Dysuria 01/29/2024   Moderately severe major depression (HCC) 01/29/2024   Multiple closed pelvic fractures with disruption of pelvic circle, initial encounter (HCC) 12/16/2023  Multiple closed pelvic fractures without disruption of pelvic circle (HCC) 12/15/2023   Closed fracture of head of left radius 12/15/2023   Closed fracture of left olecranon process 12/15/2023   Lumbar compression fracture, closed, initial encounter (HCC) 12/15/2023   OSA (obstructive sleep apnea) 07/31/2023   Osteopenia 07/31/2023   COPD (chronic obstructive pulmonary disease)  (HCC) 04/03/2023   Hypophosphatemia 11/19/2022   Metabolic acidosis 11/19/2022   Chronic heart failure with preserved ejection fraction (HFpEF) (HCC) 11/18/2022   Paroxysmal atrial fibrillation (HCC) 11/18/2022   Thrombocytopenia 11/18/2022   Hallucinations 05/17/2022   Polypharmacy 05/17/2022   General weakness 02/16/2022   Poor balance 02/16/2022   Falling 02/16/2022   Hypokalemia 02/13/2022   HTN (hypertension) 02/13/2022   DDD (degenerative disc disease), lumbar 12/26/2021   Atrial fibrillation with RVR (HCC) 10/05/2021   Estrogen deficiency 06/07/2021   Overactive bladder 10/17/2019   Medicare annual wellness visit, subsequent 01/16/2019   Thyroid  nodule 05/01/2018   Electronic cigarette use 04/03/2018   Obesity (BMI 30-39.9) 04/03/2018   Lung mass 02/01/2018   Smokers' cough (HCC) 01/01/2018   Microscopic hematuria 08/13/2012   Routine general medical examination at a health care facility 07/22/2012   Leukocytosis 03/30/2010   Prediabetes 02/21/2008   Allergic rhinitis 07/25/2007   Low back pain 06/26/2007   Personal history of goiter 07/27/2006   Hyperlipidemia 07/27/2006   PANIC DISORDER 07/27/2006   Former smoker 07/27/2006   Depression with anxiety 07/27/2006   Insomnia 07/27/2006   History of colonic polyps 06/09/2004    Social History   Tobacco Use   Smoking status: Former    Current packs/day: 0.00    Types: E-cigarettes, Cigarettes    Quit date: 2011    Years since quitting: 14.9   Smokeless tobacco: Never  Substance Use Topics   Alcohol use: No    Alcohol/week: 0.0 standard drinks of alcohol    Allergies  Allergen Reactions   Abilify [Aripiprazole] Other (See Comments)    Vision problems   Ambien  [Zolpidem ] Other (See Comments)    Hallucinations MS change    Codeine Nausea And Vomiting    Patient able to tolerate hydrocodone  and oxycodone    Lipitor [Atorvastatin] Other (See Comments)    Myalgias    Vesicare  [Solifenacin ] Other (See  Comments)    MS change   Wellbutrin  [Bupropion ] Other (See Comments)    Unknown reaction    Current Meds  Medication Sig   acetaminophen  (TYLENOL ) 500 MG tablet Take 500 mg by mouth in the morning and at bedtime.   albuterol  (VENTOLIN  HFA) 108 (90 Base) MCG/ACT inhaler Inhale 2 puffs into the lungs every 6 (six) hours as needed for wheezing or shortness of breath.   apixaban  (ELIQUIS ) 5 MG TABS tablet Take 5 mg by mouth 2 (two) times daily.   cyclobenzaprine  (FLEXERIL ) 10 MG tablet TAKE ONE HALF (1/2) TO ONE TABLET BY MOUTH AT BEDTIME AS NEEDED FOR MUSCLE SPASMS   diazepam  (VALIUM ) 5 MG tablet Take 1 tablet (5 mg total) by mouth in the morning and at bedtime.   donepezil (ARICEPT) 5 MG tablet Take 5 mg by mouth daily.   DULoxetine  (CYMBALTA ) 60 MG capsule Take 120 mg by mouth daily.   flecainide  (TAMBOCOR ) 100 MG tablet Take 1 tablet (100 mg total) by mouth 2 (two) times daily.   Fluticasone -Umeclidin-Vilant (TRELEGY ELLIPTA ) 100-62.5-25 MCG/ACT AEPB Inhale 1 puff into the lungs daily.   furosemide  (LASIX ) 20 MG tablet TAKE ONE TABLET (20 MG TOTAL) BY MOUTH DAILY.  hydrOXYzine (VISTARIL) 25 MG capsule Take 25 mg by mouth 2 (two) times daily.   lidocaine  (LIDODERM ) 5 % Place 1 patch onto the skin daily. Remove & Discard patch within 12 hours or as directed by MD   Melatonin 5 MG CAPS Take 10 mg by mouth at bedtime.   metoprolol  succinate (TOPROL -XL) 50 MG 24 hr tablet TAKE ONE TABLET BY MOUTH TWICE A DAY WITH MEALS   oxyCODONE  10 MG TABS Take 1 tablet (10 mg total) by mouth every 4 (four) hours as needed for severe pain (pain score 7-10) or breakthrough pain.   potassium chloride  SA (KLOR-CON  M) 20 MEQ tablet Take 0.5 tablets (10 mEq total) by mouth 3 (three) times daily.   rosuvastatin  (CRESTOR ) 10 MG tablet TAKE ONE TABLET BY MOUTH ONCE A DAY   traZODone  (DESYREL ) 100 MG tablet Take 200 mg by mouth at bedtime.   VITAMIN D PO Take 1 capsule by mouth daily.    Immunization History   Administered Date(s) Administered   Fluad Quad(high Dose 65+) 01/16/2019, 11/29/2021   Fluad Trivalent(High Dose 65+) 02/06/2023   INFLUENZA, HIGH DOSE SEASONAL PF 01/29/2024   Influenza Split 03/27/2011, 01/23/2012   Influenza,inj,Quad PF,6+ Mos 11/26/2012, 11/10/2013, 11/24/2016, 12/25/2017   PFIZER(Purple Top)SARS-COV-2 Vaccination 10/20/2019, 11/10/2019   Pneumococcal Conjugate-13 08/21/2014   Pneumococcal Polysaccharide-23 05/19/2008, 08/30/2015   Td 03/13/2001   Tdap 01/23/2012   Zoster Recombinant(Shingrix) 05/04/2021, 06/21/2021, 09/23/2021   Zoster, Live 01/07/2013        Objective:     Vitals:   02/14/24 1538  BP: 104/64  Pulse: 68  Temp: 97.8 F (36.6 C)  Height: 5' 3 (1.6 m)  Weight: 168 lb (76.2 kg)  SpO2: 97%  TempSrc: Temporal  BMI (Calculated): 29.77     SpO2: 97 %  GENERAL: Obese woman, no acute respiratory distress, presents in transport chair today, no conversational dyspnea. HEAD: Normocephalic, atraumatic.  EYES: Pupils equal, round, reactive to light.  No scleral icterus.  MOUTH: Teeth intact with caps on upper front teeth.  Pharynx clear, oral mucosa moist. NECK: Supple. No thyromegaly. Trachea midline. No JVD.  No adenopathy. PULMONARY: Good air entry bilaterally.  No adventitious sounds. CARDIOVASCULAR: S1 and S2.  Bradycardic regular rate at 51 bpm, no cardiac murmurs discerned. ABDOMEN: Obese, otherwise benign. MUSCULOSKELETAL: No joint deformity, no clubbing, no edema.  NEUROLOGIC: No overt focal deficit, no gait disturbance, speech is fluent. SKIN: Intact,warm,dry. PSYCH: Mood and behavior normal.    Assessment & Plan:     ICD-10-CM   1. Stage 1 mild COPD by GOLD classification (HCC)  J44.9     2. Nocturnal hypoxemia  G47.34     3. Paroxysmal atrial fibrillation (HCC)  I48.0     4. Former smoker  Z87.891      Discussion:    Chronic obstructive pulmonary disease (COPD) COPD is well-managed with Trelegy, with no respiratory  distress or exacerbation. - Continue Trelegy for COPD management.  Pelvic fracture, status post recent injury Recent pelvic fracture with significant pain and difficulty walking. Currently in rehabilitation for 34 days following an 8-day hospital stay. - Continue rehabilitation for pelvic fracture.  Left arm fracture, status post recent injury Recent left arm fracture with significant pain and difficulty walking. Currently in rehabilitation for 34 days following an 8-day hospital stay.  Right foot swelling following trauma Right foot swelling following trauma, initially more pronounced but now improving. No indication of pulmonary involvement.  Cognitive impairment, improved Cognitive impairment has improved, with better memory  and ability to perform tasks such as writing checks. Previous trial of carbidopa was not tolerated due to gastrointestinal side effects. - Will consider alternative medications for cognitive impairment if needed.     Will see the patient in follow-up in 4 months time call sooner should any new problems arise.  Advised if symptoms do not improve or worsen, to please contact office for sooner follow up or seek emergency care.    I spent 32 minutes of dedicated to the care of this patient on the date of this encounter to include pre-visit review of records, face-to-face time with the patient discussing conditions above, post visit ordering of testing, clinical documentation with the electronic health record, making appropriate referrals as documented, and communicating necessary findings to members of the patients care team.     C. Leita Sanders, MD [facility]    *This note was generated using voice recognition software/Dragon and/or AI transcription program.  Despite best efforts to proofread, errors can occur which can change the meaning. Any transcriptional errors that result from this process are unintentional and  may not be fully corrected at the time of dictation.

## 2024-02-14 NOTE — Telephone Encounter (Signed)
 Left VM giving VO to Esther   Left VM requesting pt to call the office back, only dpr is for spouse who has same #

## 2024-02-14 NOTE — Telephone Encounter (Signed)
 Copied from CRM #[id number]. Topic: Clinical - Home Health Verbal Orders >> Feb 14, 2024  3:19 PM Robinson DEL wrote: Caller/Agency: [facility] Callback Number: [phone] Secure Line Service Requested: Occupational Therapy Frequency: 1 week 8 Any new concerns about the patient? Yes, Very depressed was in tears

## 2024-02-14 NOTE — Telephone Encounter (Signed)
 Please ok those verbal orders   Please encourage pt/family to reach out to her psychiatrist (and counselor if she has one)  Make sure she is taking all of her mental health medication   Thanks  Please keep me updated

## 2024-02-15 NOTE — Telephone Encounter (Signed)
 Pt's spouse called me back, he said pt is depressed she has good and bad days. She is just overwhelmed with her overall health and trying to recover and seeing a lot of specialist right now. Pt had not seen her psychiatrist and therapist in a while due to all of her other appts but her spouse did schedule her a f/u with them and it's coming up soon, pt is taking her meds as prescribed. Spouse said her sxs are not worsening she was just having a bad day when OT came to eval her but since then she has been doing better. They will keep the f/u with PCP and also with the psychiatrist to discuss her mental status.   FYI to PCP

## 2024-02-15 NOTE — Telephone Encounter (Signed)
 Thanks for the update

## 2024-02-18 DIAGNOSIS — M7062 Trochanteric bursitis, left hip: Secondary | ICD-10-CM | POA: Diagnosis not present

## 2024-02-18 DIAGNOSIS — M5416 Radiculopathy, lumbar region: Secondary | ICD-10-CM | POA: Diagnosis not present

## 2024-02-18 NOTE — Telephone Encounter (Signed)
 Called pt to get name of physiatrist so I can route this message to them but no answer. Left VM requesting pt to call the office back   **[id number]** if pt calls back please get the name of her psychiatrist so we can route this phone note to them**

## 2024-02-18 NOTE — Telephone Encounter (Signed)
 Please pass this message chain along to her psychiatry practice if possible

## 2024-02-19 ENCOUNTER — Encounter: Payer: Self-pay | Admitting: Oncology

## 2024-02-19 ENCOUNTER — Inpatient Hospital Stay: Attending: Oncology | Admitting: Oncology

## 2024-02-19 ENCOUNTER — Telehealth: Payer: Self-pay | Admitting: *Deleted

## 2024-02-19 ENCOUNTER — Inpatient Hospital Stay

## 2024-02-19 VITALS — BP 110/64 | HR 73 | Temp 97.5°F | Resp 18 | Wt 162.0 lb

## 2024-02-19 DIAGNOSIS — Z801 Family history of malignant neoplasm of trachea, bronchus and lung: Secondary | ICD-10-CM | POA: Diagnosis not present

## 2024-02-19 DIAGNOSIS — R233 Spontaneous ecchymoses: Secondary | ICD-10-CM | POA: Insufficient documentation

## 2024-02-19 DIAGNOSIS — Z8616 Personal history of COVID-19: Secondary | ICD-10-CM | POA: Diagnosis not present

## 2024-02-19 DIAGNOSIS — R5383 Other fatigue: Secondary | ICD-10-CM | POA: Insufficient documentation

## 2024-02-19 DIAGNOSIS — I509 Heart failure, unspecified: Secondary | ICD-10-CM | POA: Insufficient documentation

## 2024-02-19 DIAGNOSIS — D696 Thrombocytopenia, unspecified: Secondary | ICD-10-CM

## 2024-02-19 DIAGNOSIS — I4891 Unspecified atrial fibrillation: Secondary | ICD-10-CM | POA: Diagnosis not present

## 2024-02-19 DIAGNOSIS — Z7901 Long term (current) use of anticoagulants: Secondary | ICD-10-CM | POA: Insufficient documentation

## 2024-02-19 DIAGNOSIS — Z79899 Other long term (current) drug therapy: Secondary | ICD-10-CM | POA: Insufficient documentation

## 2024-02-19 DIAGNOSIS — Z114 Encounter for screening for human immunodeficiency virus [HIV]: Secondary | ICD-10-CM

## 2024-02-19 DIAGNOSIS — Z803 Family history of malignant neoplasm of breast: Secondary | ICD-10-CM | POA: Insufficient documentation

## 2024-02-19 DIAGNOSIS — Z7289 Other problems related to lifestyle: Secondary | ICD-10-CM

## 2024-02-19 DIAGNOSIS — Z87891 Personal history of nicotine dependence: Secondary | ICD-10-CM | POA: Diagnosis not present

## 2024-02-19 DIAGNOSIS — D72829 Elevated white blood cell count, unspecified: Secondary | ICD-10-CM | POA: Diagnosis not present

## 2024-02-19 DIAGNOSIS — L989 Disorder of the skin and subcutaneous tissue, unspecified: Secondary | ICD-10-CM | POA: Insufficient documentation

## 2024-02-19 DIAGNOSIS — J439 Emphysema, unspecified: Secondary | ICD-10-CM | POA: Insufficient documentation

## 2024-02-19 LAB — LACTATE DEHYDROGENASE: LDH: 404 U/L — ABNORMAL HIGH (ref 105–235)

## 2024-02-19 LAB — CBC WITH DIFFERENTIAL/PLATELET
Abs Immature Granulocytes: 1.6 K/uL — ABNORMAL HIGH (ref 0.00–0.07)
Basophils Absolute: 0.1 K/uL (ref 0.0–0.1)
Basophils Relative: 1 %
Eosinophils Absolute: 0.1 K/uL (ref 0.0–0.5)
Eosinophils Relative: 1 %
HCT: 39.1 % (ref 36.0–46.0)
Hemoglobin: 12.9 g/dL (ref 12.0–15.0)
Immature Granulocytes: 11 %
Lymphocytes Relative: 13 %
Lymphs Abs: 1.9 K/uL (ref 0.7–4.0)
MCH: 29.5 pg (ref 26.0–34.0)
MCHC: 33 g/dL (ref 30.0–36.0)
MCV: 89.3 fL (ref 80.0–100.0)
Monocytes Absolute: 3.2 K/uL — ABNORMAL HIGH (ref 0.1–1.0)
Monocytes Relative: 21 %
Neutro Abs: 8.1 K/uL — ABNORMAL HIGH (ref 1.7–7.7)
Neutrophils Relative %: 53 %
Platelets: 112 K/uL — ABNORMAL LOW (ref 150–400)
RBC: 4.38 MIL/uL (ref 3.87–5.11)
RDW: 14.4 % (ref 11.5–15.5)
WBC: 14.9 K/uL — ABNORMAL HIGH (ref 4.0–10.5)
nRBC: 0.4 % — ABNORMAL HIGH (ref 0.0–0.2)

## 2024-02-19 LAB — RETIC PANEL
Immature Retic Fract: 28.2 % — ABNORMAL HIGH (ref 2.3–15.9)
RBC.: 4.26 MIL/uL (ref 3.87–5.11)
Retic Count, Absolute: 101 K/uL (ref 19.0–186.0)
Retic Ct Pct: 2.4 % (ref 0.4–3.1)
Reticulocyte Hemoglobin: 32.2 pg (ref >27.9)

## 2024-02-19 LAB — VITAMIN B12: Vitamin B-12: 1057 pg/mL — ABNORMAL HIGH (ref 180–914)

## 2024-02-19 LAB — CMP (CANCER CENTER ONLY)
ALT: 12 U/L (ref 0–44)
AST: 25 U/L (ref 15–41)
Albumin: 3.3 g/dL — ABNORMAL LOW (ref 3.5–5.0)
Alkaline Phosphatase: 188 U/L — ABNORMAL HIGH (ref 38–126)
Anion gap: 14 (ref 5–15)
BUN: 12 mg/dL (ref 8–23)
CO2: 26 mmol/L (ref 22–32)
Calcium: 9.6 mg/dL (ref 8.9–10.3)
Chloride: 99 mmol/L (ref 98–111)
Creatinine: 0.74 mg/dL (ref 0.44–1.00)
GFR, Estimated: >60 mL/min (ref >60)
Glucose, Bld: 162 mg/dL — ABNORMAL HIGH (ref 70–99)
Potassium: 3.8 mmol/L (ref 3.5–5.1)
Sodium: 138 mmol/L (ref 135–145)
Total Bilirubin: 0.5 mg/dL (ref 0.0–1.2)
Total Protein: 6.6 g/dL (ref 6.5–8.1)

## 2024-02-19 LAB — HIV ANTIBODY (ROUTINE TESTING W REFLEX): HIV Screen 4th Generation wRfx: NONREACTIVE

## 2024-02-19 LAB — HEPATITIS PANEL, ACUTE
HCV Ab: NONREACTIVE
Hep A IgM: NONREACTIVE
Hep B C IgM: NONREACTIVE
Hepatitis B Surface Ag: NONREACTIVE

## 2024-02-19 LAB — IMMATURE PLATELET FRACTION: Immature Platelet Fraction: 6 % (ref 1.2–8.6)

## 2024-02-19 LAB — FOLATE: Folate: 10.5 ng/mL (ref >5.9)

## 2024-02-19 NOTE — Assessment & Plan Note (Signed)
 Bi acute bleeding.  Due to on anticoagulation and thrombocytopenia

## 2024-02-19 NOTE — Assessment & Plan Note (Signed)
 Possibly due to skin tear/abrasion.  Recommend patient to continue applying topical antibiotics.

## 2024-02-19 NOTE — Assessment & Plan Note (Addendum)
 Pending above work up Chronic neutrophilia and monocytosis.

## 2024-02-19 NOTE — Telephone Encounter (Signed)
 Copied from CRM #[id number]. Topic: Clinical - Home Health Verbal Orders >> Feb 19, 2024 12:20 PM Sasha M wrote: Caller/Agency: Mardeen Gur[facility] Callback Number: [phone] Service Requested: Skilled Nursing Frequency: Evaluation for now Any new concerns about the patient? Yes, has a 2-3 cm wound on right breast and breast itself is red. Left breast/areola is red as well.  Is concerned about depression and would like Dr Randeen to assess and discuss eating habits also.

## 2024-02-19 NOTE — Telephone Encounter (Signed)
 Pt has an appt scheduled on 02/21/24 for discuss multiple issues, PCP can eval then

## 2024-02-19 NOTE — Progress Notes (Signed)
 Hematology/Oncology Consult note [phone] [phone]        REFERRING PROVIDER: Randeen [name], MD   CHIEF COMPLAINTS/REASON FOR VISIT:  Evaluation of thrombocytopenia   ASSESSMENT & PLAN:   Easy bruising Bi acute bleeding.  Due to on anticoagulation and thrombocytopenia   Thrombocytopenia For the work up of patient's thrombocytopenia, I recommend checking CBC;CMP, immature platelet fraction, LDH; smear review, folate, Vitamin B12, hepatitis, HIV, flowcytometry and monoclonal gammopathy workup.    Leukocytosis Pending above work up Chronic neutrophilia and monocytosis.   Skin lesion Possibly due to skin tear/abrasion.  Recommend patient to continue applying topical antibiotics.    Orders Placed This Encounter  Procedures   Vitamin B12    Standing Status:   Future    Number of Occurrences:   1    Expected Date:   02/19/2024    Expiration Date:   05/19/2024   Folate    Standing Status:   Future    Number of Occurrences:   1    Expected Date:   02/19/2024    Expiration Date:   05/19/2024   CBC with Differential/Platelet    Standing Status:   Future    Number of Occurrences:   1    Expected Date:   02/19/2024    Expiration Date:   05/19/2024   Retic Panel    Standing Status:   Future    Number of Occurrences:   1    Expected Date:   02/19/2024    Expiration Date:   05/19/2024   Immature Platelet Fraction    Standing Status:   Future    Number of Occurrences:   1    Expected Date:   02/19/2024    Expiration Date:   05/19/2024   Multiple Myeloma Panel (SPEP&IFE w/QIG)    Standing Status:   Future    Number of Occurrences:   1    Expected Date:   02/19/2024    Expiration Date:   05/19/2024   Kappa/lambda light chains    Standing Status:   Future    Number of Occurrences:   1    Expected Date:   02/19/2024    Expiration Date:   05/19/2024   Flow cytometry panel-leukemia/lymphoma work-up    Standing Status:   Future    Number of Occurrences:   1     Expected Date:   02/19/2024    Expiration Date:   05/19/2024   Lactate dehydrogenase    Standing Status:   Future    Number of Occurrences:   1    Expected Date:   02/19/2024    Expiration Date:   05/19/2024   HIV Antibody (routine testing w rflx)    Standing Status:   Future    Number of Occurrences:   1    Expected Date:   02/19/2024    Expiration Date:   05/19/2024   Hepatitis panel, acute    Standing Status:   Future    Number of Occurrences:   1    Expected Date:   02/19/2024    Expiration Date:   05/19/2024   CMP (Cancer Center only)    Standing Status:   Future    Number of Occurrences:   1    Expected Date:   02/19/2024    Expiration Date:   05/19/2024   Follow up in a few weeks All questions were answered. The patient knows to call the clinic with any problems, questions or  concerns.  Anita Cap, MD, PhD [facility] Hematology Oncology 02/19/2024   HISTORY OF PRESENTING ILLNESS:   Anita Harrell is a  73 y.o.  female with PMH listed below was seen in consultation at the request of  Tower, [name], MD  for evaluation of  thrombocytopenia  Discussed the use of AI scribe software for clinical note transcription with the patient, who gave verbal consent to proceed.   Thrombocytopenia has been present since 2023 . At the time of initial platelet decline, she experienced COVID-19 infection and a vertebral fracture due to a fall. She is aware of intermittently elevated white blood cell counts. She does not smoke and denies current alcohol use, with remote weekend alcohol consumption discontinued over 25 years ago.  She is currently taking Eliquis  for atrial fibrillation, with a history of recurrent arrhythmia and multiple cardioversion attempts. She denies active bleeding but notes mild epistaxis when blowing her nose. She experiences frequent ecchymoses, including a new lesion on the right breast first noticed two days ago, without antecedent trauma or ablation. The lesion has remained  unchanged after topical Neosporin application. Additional bruising is present on her arms, which she attributes to anticoagulation therapy. She has CHF, COPD emphysema.   She reports severe fatigue, describing her energy level as zero and is unable to ambulate independently, requiring a walker or wheelchair. She has difficulty wearing a bra due to discomfort and moisture at the site of the right breast ecchymosis.  She recently completed two rounds of antibiotics for urinary tract infection and is scheduled for follow-up testing later in the week.   MEDICAL HISTORY:  Past Medical History:  Diagnosis Date   Allergy    allergic rhinitis   Anxiety    Arrhythmia    atrial fibrillation   Arthritis    Back pain    Chest pain    CHF (congestive heart failure) (HCC)    COPD (chronic obstructive pulmonary disease) (HCC)    Depression    Emphysema of lung (HCC)    Epigastric pain    GERD (gastroesophageal reflux disease)    Goiter    History of tobacco abuse    Hyperglycemia    mild   Hyperlipidemia    Hypertension    Insomnia    Hx of   Labile blood pressure    Panic disorder    History of   Personal history of colonic polyps 06/09/2004   SVD (spontaneous vaginal delivery)    x 2    SURGICAL HISTORY: Past Surgical History:  Procedure Laterality Date   ABDOMINAL HYSTERECTOMY N/A 09/24/2012   Procedure: HYSTERECTOMY ABDOMINAL;  Surgeon: Peggye Gull, MD;  Location: [facility];  Service: Gynecology;  Laterality: N/A;   BREAST BIOPSY Right 2016   CARDIOVERSION N/A 02/09/2022   Procedure: CARDIOVERSION;  Surgeon: Perla Evalene JINNY, MD;  Location: [facility];  Service: Cardiovascular;  Laterality: N/A;   CARDIOVERSION N/A 03/02/2022   Procedure: CARDIOVERSION;  Surgeon: Perla Evalene JINNY, MD;  Location: [facility];  Service: Cardiovascular;  Laterality: N/A;   COLONOSCOPY     COLONOSCOPY  2017   ELBOW SURGERY  2004   EYE SURGERY     bilateral lasik   SALPINGOOPHORECTOMY Bilateral  09/24/2012   Procedure: SALPINGO OOPHORECTOMY;  Surgeon: Peggye Gull, MD;  Location: [facility];  Service: Gynecology;  Laterality: Bilateral;   THYROID  SURGERY     B9 massess   WART FULGURATION N/A 09/24/2012   Procedure: FULGURATION VAGINAL WART;  Surgeon: Peggye Gull,  MD;  Location: [facility];  Service: Gynecology;  Laterality: N/A;   WISDOM TOOTH EXTRACTION      SOCIAL HISTORY: Social History   Socioeconomic History   Marital status: Married    Spouse name: Not on file   Number of children: Not on file   Years of education: Not on file   Highest education level: Not on file  Occupational History   Not on file  Tobacco Use   Smoking status: Former    Current packs/day: 0.00    Types: E-cigarettes, Cigarettes    Quit date: 2011    Years since quitting: 14.9   Smokeless tobacco: Never  Vaping Use   Vaping status: Former   Quit date: 11/18/2022  Substance and Sexual Activity   Alcohol use: No    Alcohol/week: 0.0 standard drinks of alcohol   Drug use: No   Sexual activity: Yes    Birth control/protection: Post-menopausal  Other Topics Concern   Not on file  Social History Narrative   Lives with husband and 2 pets; dogs.   Social Drivers of Corporate Investment Banker Strain: Low Risk  (09/19/2023)   Received from [facility]   Overall Financial Resource Strain (CARDIA)    Difficulty of Paying Living Expenses: Not hard at all  Food Insecurity: No Food Insecurity (12/16/2023)   Hunger Vital Sign    Worried About Running Out of Food in the Last Year: Never true    Ran Out of Food in the Last Year: Never true  Transportation Needs: No Transportation Needs (12/16/2023)   PRAPARE - Administrator, Civil Service (Medical): No    Lack of Transportation (Non-Medical): No  Physical Activity: Inactive (07/27/2023)   Exercise Vital Sign    Days of Exercise per Week: 0 days    Minutes of Exercise per Session: 0 min  Stress: No Stress Concern Present  (07/27/2023)   [employer] of Occupational Health - Occupational Stress Questionnaire    Feeling of Stress : Only a little  Social Connections: Moderately Isolated (12/16/2023)   Social Connection and Isolation Panel    Frequency of Communication with Friends and Family: Three times a week    Frequency of Social Gatherings with Friends and Family: Once a week    Attends Religious Services: Never    Database Administrator or Organizations: No    Attends Banker Meetings: Never    Marital Status: Married  Catering Manager Violence: Not At Risk (12/16/2023)   Humiliation, Afraid, Rape, and Kick questionnaire    Fear of Current or Ex-Partner: No    Emotionally Abused: No    Physically Abused: No    Sexually Abused: No    FAMILY HISTORY: Family History  Problem Relation Age of Onset   Hypertension Mother    Stroke Mother    Alcohol abuse Father    Cancer Father        lung CA   Heart disease Father        CHF   Cancer Sister        breast CA   Heart disease Sister        CHF from chemotx also has defib   Breast cancer Sister    Colon cancer Neg Hx    Esophageal cancer Neg Hx    Rectal cancer Neg Hx    Stomach cancer Neg Hx     ALLERGIES:  is allergic to abilify [aripiprazole], ambien  [zolpidem ], codeine,  lipitor [atorvastatin], vesicare  [solifenacin ], and wellbutrin  [bupropion ].  MEDICATIONS:  Current Outpatient Medications  Medication Sig Dispense Refill   acetaminophen  (TYLENOL ) 500 MG tablet Take 500 mg by mouth in the morning and at bedtime.     albuterol  (VENTOLIN  HFA) 108 (90 Base) MCG/ACT inhaler Inhale 2 puffs into the lungs every 6 (six) hours as needed for wheezing or shortness of breath. 8 g 11   apixaban  (ELIQUIS ) 5 MG TABS tablet Take 5 mg by mouth 2 (two) times daily.     cyclobenzaprine  (FLEXERIL ) 10 MG tablet TAKE ONE HALF (1/2) TO ONE TABLET BY MOUTH AT BEDTIME AS NEEDED FOR MUSCLE SPASMS 30 tablet 0   diazepam  (VALIUM ) 5 MG tablet Take 1  tablet (5 mg total) by mouth in the morning and at bedtime. 10 tablet 0   donepezil (ARICEPT) 5 MG tablet Take 5 mg by mouth daily.     DULoxetine  (CYMBALTA ) 60 MG capsule Take 120 mg by mouth daily.     flecainide  (TAMBOCOR ) 100 MG tablet Take 1 tablet (100 mg total) by mouth 2 (two) times daily. 180 tablet 3   Fluticasone -Umeclidin-Vilant (TRELEGY ELLIPTA ) 100-62.5-25 MCG/ACT AEPB Inhale 1 puff into the lungs daily.     furosemide  (LASIX ) 20 MG tablet TAKE ONE TABLET (20 MG TOTAL) BY MOUTH DAILY. 90 tablet 3   hydrOXYzine (VISTARIL) 25 MG capsule Take 25 mg by mouth 2 (two) times daily.     lidocaine  (LIDODERM ) 5 % Place 1 patch onto the skin daily. Remove & Discard patch within 12 hours or as directed by MD 10 patch 0   Melatonin 5 MG CAPS Take 10 mg by mouth at bedtime.     metoprolol  succinate (TOPROL -XL) 50 MG 24 hr tablet TAKE ONE TABLET BY MOUTH TWICE A DAY WITH MEALS 180 tablet 0   oxyCODONE  10 MG TABS Take 1 tablet (10 mg total) by mouth every 4 (four) hours as needed for severe pain (pain score 7-10) or breakthrough pain. 10 tablet 0   potassium chloride  SA (KLOR-CON  M) 20 MEQ tablet Take 0.5 tablets (10 mEq total) by mouth 3 (three) times daily. 135 tablet 3   rosuvastatin  (CRESTOR ) 10 MG tablet TAKE ONE TABLET BY MOUTH ONCE A DAY 90 tablet 1   traZODone  (DESYREL ) 100 MG tablet Take 200 mg by mouth at bedtime.     VITAMIN D PO Take 1 capsule by mouth daily.     No current facility-administered medications for this visit.    Review of Systems  Constitutional:  Positive for fatigue. Negative for appetite change, chills and fever.  HENT:   Positive for nosebleeds. Negative for hearing loss and voice change.   Eyes:  Negative for eye problems.  Respiratory:  Positive for shortness of breath. Negative for chest tightness and cough.   Cardiovascular:  Negative for chest pain.  Gastrointestinal:  Negative for abdominal distention, abdominal pain and blood in stool.  Endocrine: Negative  for hot flashes.  Genitourinary:  Negative for difficulty urinating and frequency.   Musculoskeletal:  Negative for arthralgias.  Skin:  Negative for itching and rash.  Neurological:  Negative for extremity weakness.  Hematological:  Negative for adenopathy. Bruises/bleeds easily.  Psychiatric/Behavioral:  Negative for confusion.    PHYSICAL EXAMINATION:  Vitals:   02/19/24 1504  BP: 110/64  Pulse: 73  Resp: 18  Temp: (!) 97.5 F (36.4 C)  SpO2: 97%   Filed Weights   02/19/24 1504  Weight: 162 lb (73.5 kg)    Physical  Exam Constitutional:      General: She is not in acute distress.    Appearance: She is obese.  HENT:     Head: Normocephalic and atraumatic.  Eyes:     General: No scleral icterus. Cardiovascular:     Rate and Rhythm: Normal rate and regular rhythm.  Pulmonary:     Effort: Pulmonary effort is normal. No respiratory distress.     Breath sounds: Normal breath sounds. No wheezing.  Abdominal:     General: Bowel sounds are normal. There is no distension.     Palpations: Abdomen is soft.  Musculoskeletal:        General: No deformity. Normal range of motion.     Cervical back: Normal range of motion and neck supple.  Skin:    General: Skin is warm and dry.     Findings: No erythema or rash.  Neurological:     Mental Status: She is alert and oriented to person, place, and time. Mental status is at baseline.  Psychiatric:        Mood and Affect: Mood normal.   Right breast skin lesion   LABORATORY DATA:  I have reviewed the data as listed    Latest Ref Rng & Units 02/19/2024    3:52 PM 01/29/2024   11:33 AM 12/19/2023    6:41 AM  CBC  WBC 4.0 - 10.5 K/uL 14.9  16.3  12.1   Hemoglobin 12.0 - 15.0 g/dL 87.0  87.3  88.8   Hematocrit 36.0 - 46.0 % 39.1  38.0  32.9   Platelets 150 - 400 K/uL 112  90.0  81       Latest Ref Rng & Units 02/19/2024    3:52 PM 02/04/2024    2:59 PM 01/29/2024   11:33 AM  CMP  Glucose 70 - 99 mg/dL 837  868  898    BUN 8 - 23 mg/dL 12  8  6    Creatinine 0.44 - 1.00 mg/dL 9.25  9.27  9.49   Sodium 135 - 145 mmol/L 138  141  139   Potassium 3.5 - 5.1 mmol/L 3.8  3.4  3.3   Chloride 98 - 111 mmol/L 99  100  102   CO2 22 - 32 mmol/L 26  22  27    Calcium  8.9 - 10.3 mg/dL 9.6  9.4  8.8   Total Protein 6.5 - 8.1 g/dL 6.6     Total Bilirubin 0.0 - 1.2 mg/dL 0.5     Alkaline Phos 38 - 126 U/L 188     AST 15 - 41 U/L 25     ALT 0 - 44 U/L 12         RADIOGRAPHIC STUDIES: I have personally reviewed the radiological images as listed and agreed with the findings in the report. CT Elbow Left Wo Contrast Result Date: 12/15/2023 EXAM: CT Left Upper Extremity, Without IV Contrast TECHNIQUE: Axial images were acquired through the left upper extremity without IV contrast. Reformatted images were reviewed. Automated exposure control, iterative reconstruction, and/or weight-based adjustment of the mA/kV was utilized to reduce the radiation dose to as low as reasonably achievable. COMPARISON: None available. CLINICAL HISTORY: Elbow trauma. Mechanical fall onto left side. Pt c/o pain in left hip and elbow. Pt hit left side of head. No LOC. FINDINGS: BONES AND JOINTS: The osseous structures are mildly osteopenic. There is a minimally displaced anatomic aligned intraarticular fracture of the radial head best seen on image 53/7 and 80/3 with  fracture fragments in anatomic alignment. The articular surface appears congruent. There is, additionally, a minimally displaced anatomically aligned fracture of the olecranon, best seen on image 43/7 and 80/3. Normal overall alignment. No dislocation. Moderate degenerative arthritis involving the ulnohumeral and radiocapitellar joints. SOFT TISSUES: Small left elbow effusion is present. There is infiltration within the antecubital fossa in keeping with edema and hemorrhage. No mass or hematoma identified. IMPRESSION: 1. Minimally displaced, anatomically aligned intraarticular fractures of the  radial head and fracture of the olecranon. No dislocation. 2. Small left elbow effusion and antecubital fossa edema/hemorrhage. No mass or hematoma identified. 3. Moderate degenerative arthritis involving the ulnohumeral and radiocapitellar joints. Electronically signed by: Dorethia Molt MD 12/15/2023 03:02 AM EDT RP Workstation: [id number]   CT CHEST ABDOMEN PELVIS W CONTRAST Result Date: 12/15/2023 EXAM: CT CHEST, ABDOMEN AND PELVIS WITH CONTRAST 12/15/2023 01:57:15 AM TECHNIQUE: CT of the chest, abdomen and pelvis was performed with the administration of intravenous contrast, 75mL (iohexol  (OMNIPAQUE ) 350 MG/ML injection 75 mL IOHEXOL  350 MG/ML SOLN). Multiplanar reformatted images are provided for review. Automated exposure control, iterative reconstruction, and/or weight based adjustment of the mA/kV was utilized to reduce the radiation dose to as low as reasonably achievable. COMPARISON: Comparison examination is January 29, 2023. CLINICAL HISTORY: Polytrauma, blunt. Mechanical fall onto left side. Patient complains of pain in left hip and elbow. Patient hit left side of head. No loss of consciousness. FINDINGS: CHEST: MEDIASTINUM AND LYMPH NODES: Mild coronary artery calcification. Global cardiac size within normal limits. No pericardial effusion. Central pulmonary arteries are of normal caliber. Moderate atherosclerotic calcification within the thoracic aorta. No aortic aneurysm. Surgical changes of left thyroidectomy identified. Multiple nodules identified within the visual and right thyroid  lobe measuring up to 12 mm which was previously biopsied on November 06, 2023. No follow-up imaging recommended. No pathologic thoracic adenopathy. The esophagus is unremarkable. LUNGS AND PLEURA: Mild emphysema. 7 mm sub solid nodule within the right apex, image 40/5, stable. Stable right pleural thickening. No confluent pulmonary infiltrate. No pneumothorax or pleural effusion. Multiple healed right rib fractures  are identified. ABDOMEN AND PELVIS: LIVER: Mild hepatic steatosis. GALLBLADDER AND BILE DUCTS: Cholelithiasis without superimposed pericholecystic inflammatory change. No intra or extrahepatic biliary ductal dilation. SPLEEN: No acute abnormality. PANCREAS: No acute abnormality. ADRENAL GLANDS: No acute abnormality. KIDNEYS, URETERS AND BLADDER: No stones in the kidneys or ureters. No hydronephrosis. No perinephric or periureteral stranding. Urinary bladder is unremarkable, with moderate acute interstitial hemorrhage within the space of retzius adjacent to the pubic symphyseal fractures demonstrating mild mass effect on the bladder. GI AND BOWEL: The stomach, small bowel, and large bowel are otherwise unremarkable. Appendix is normal. Moderate sigmoid diverticulosis. No free intraperitoneal gas or fluid. REPRODUCTIVE ORGANS: Uterus is absent. No adnexal masses. PERITONEUM AND RETROPERITONEUM: No ascites. VASCULATURE: Moderate aortoiliac atherosclerotic calcification. No aortic aneurysm. BONES AND SOFT TISSUES: Acute mildly displaced fractures of the left superior and inferior pubic rami at the pubic symphysis as well as the left sacral ala demonstrate mild impaction in keeping with a lateral compression type injury. Acute superior endplate fracture of L5 with minimal loss of height. No retropulsion. Osseous structures are diffusely osteopenic. Mild lumbar levoscoliosis. Rotator cuff scarring. IMPRESSION: 1. Acute mildly displaced fractures of the left superior and inferior pubic rami at the pubic symphysis and the left sacral ala with mild impaction, consistent with a lateral compression type injury. Associated moderate acute interstitial extraperitoneal hemorrhage with mild mass effect on the bladder. No active extravasation. 2. Acute superior  endplate fracture of L5 with minimal loss of height. No retropulsion. 3. Multiple healed right rib fractures. No acute bone abnormality within the thorax . 4. Additional  incidental findings as noted above. Electronically signed by: Dorethia Molt MD 12/15/2023 02:42 AM EDT RP Workstation: [id number]   CT CERVICAL SPINE WO CONTRAST Result Date: 12/15/2023 CLINICAL DATA:  Status post trauma. EXAM: CT CERVICAL SPINE WITHOUT CONTRAST TECHNIQUE: Multidetector CT imaging of the cervical spine was performed without intravenous contrast. Multiplanar CT image reconstructions were also generated. RADIATION DOSE REDUCTION: This exam was performed according to the departmental dose-optimization program which includes automated exposure control, adjustment of the mA and/or kV according to patient size and/or use of iterative reconstruction technique. COMPARISON:  None Available. FINDINGS: Alignment: There is straightening of the normal cervical spine lordosis. Skull base and vertebrae: No acute fracture. No primary bone lesion or focal pathologic process. Soft tissues and spinal canal: No prevertebral fluid or swelling. No visible canal hematoma. Disc levels: Moderate severity multilevel endplate sclerosis is seen throughout the cervical spine with mild to moderate severity anterior osteophyte formation and posterior bony spurring also present at the levels of C4-C5, C5-C6 and C6-C7. Moderate to marked severity intervertebral disc space narrowing is seen at C6-C7, with mild to moderate severity intervertebral disc space narrowing present throughout the remainder of the cervical spine. Marked severity bilateral multilevel facet joint hypertrophy is noted. Upper chest: Mild areas of scarring and/or atelectasis are seen within the bilateral apices. Other: Small thyroid  nodules are seen within the right lobe of the thyroid  gland. The thyroid  gland is not identified. IMPRESSION: 1. No acute fracture or subluxation in the cervical spine. 2. Moderate to marked severity multilevel degenerative changes, as described above. Electronically Signed   By: Suzen Dials M.D.   On: 12/15/2023 00:34   CT  HEAD WO CONTRAST Result Date: 12/15/2023 CLINICAL DATA:  Status post trauma. EXAM: CT HEAD WITHOUT CONTRAST TECHNIQUE: Contiguous axial images were obtained from the base of the skull through the vertex without intravenous contrast. RADIATION DOSE REDUCTION: This exam was performed according to the departmental dose-optimization program which includes automated exposure control, adjustment of the mA and/or kV according to patient size and/or use of iterative reconstruction technique. COMPARISON:  January 29, 2020 FINDINGS: Brain: There is generalized cerebral atrophy with widening of the extra-axial spaces and ventricular dilatation. There are areas of decreased attenuation within the white matter tracts of the supratentorial brain, consistent with microvascular disease changes. Vascular: No hyperdense vessel or unexpected calcification. Skull: Normal. Negative for fracture or focal lesion. Sinuses/Orbits: No acute finding. Other: None. IMPRESSION: 1. No acute intracranial abnormality. 2. Generalized cerebral atrophy and microvascular disease changes of the supratentorial brain. Electronically Signed   By: Suzen Dials M.D.   On: 12/15/2023 00:29   DG Chest Port 1 View Result Date: 12/15/2023 CLINICAL DATA:  Status post trauma. EXAM: PORTABLE CHEST 1 VIEW COMPARISON:  November 18, 2022 FINDINGS: The heart size and mediastinal contours are within normal limits. Low lung volumes are noted with mild elevation of the right hemidiaphragm. No acute infiltrate, pleural effusion or pneumothorax is identified. A mildly displaced ninth right rib fracture is suspected. Degenerative changes are seen involving both shoulders and multiple levels of the thoracic spine. IMPRESSION: 1. Low lung volumes without acute cardiopulmonary disease. 2. Suspected mildly displaced ninth right rib fracture. Correlation with physical examination is recommended to determine the presence of point tenderness. Electronically Signed   By:  Suzen Dials M.D.   On:  12/15/2023 00:26   DG Hip Unilat W or Wo Pelvis 2-3 Views Left Result Date: 12/15/2023 CLINICAL DATA:  Status post fall. EXAM: DG HIP (WITH OR WITHOUT PELVIS) 2-3V LEFT COMPARISON:  None Available. FINDINGS: A fracture deformity of indeterminate age is seen involving the symphysis pubis on the left with extension to involve the adjacent portions of the left superior and left inferior pubic rami. There is no evidence of dislocation. Degenerative changes are seen in the form of joint space narrowing and acetabular sclerosis. IMPRESSION: Fracture of the symphysis pubis on the left with extension to involve the adjacent portions of the left superior and left inferior pubic rami. CT correlation is recommended. Electronically Signed   By: Suzen Dials M.D.   On: 12/15/2023 00:21   DG Elbow 2 Views Left Result Date: 12/15/2023 CLINICAL DATA:  Status post trauma. EXAM: LEFT ELBOW - 2 VIEW COMPARISON:  None Available. FINDINGS: The cortical borders of the left radial head and neck are irregular in appearance on the lateral view. This is of indeterminate age. There is no evidence of dislocation. A superficial soft tissue laceration is seen along the medial aspect of the proximal left forearm. IMPRESSION: Findings which may represent a nondisplaced fracture of the left radial head and neck of indeterminate age. Correlation with physical examination is recommended to determine the presence of point tenderness. Electronically Signed   By: Suzen Dials M.D.   On: 12/15/2023 00:18   DG Knee Left Port Result Date: 12/15/2023 CLINICAL DATA:  Status post fall. EXAM: PORTABLE LEFT KNEE - 1-2 VIEW COMPARISON:  None Available. FINDINGS: No evidence of an acute fracture or dislocation. Marked severity tricompartmental degenerative changes are seen. Soft tissues are unremarkable. IMPRESSION: Marked severity tricompartmental degenerative changes. Electronically Signed   By: Suzen Dials  M.D.   On: 12/15/2023 00:15

## 2024-02-19 NOTE — Assessment & Plan Note (Signed)
For the work up of patient's thrombocytopenia, I recommend checking CBC;CMP, immature platelet fraction, LDH; smear review, folate, Vitamin B12, hepatitis, HIV, flowcytometry and monoclonal gammopathy workup.   

## 2024-02-19 NOTE — Telephone Encounter (Signed)
 Thanks for the heads up  Need to see the wound Please schedule appointment if she is open to it  (I know she has had a lot of appts )  She does see psychiatry for mood as well

## 2024-02-20 ENCOUNTER — Telehealth: Payer: Self-pay | Admitting: Emergency Medicine

## 2024-02-20 DIAGNOSIS — Z79899 Other long term (current) drug therapy: Secondary | ICD-10-CM

## 2024-02-20 LAB — KAPPA/LAMBDA LIGHT CHAINS
Kappa free light chain: 33.9 mg/L — ABNORMAL HIGH (ref 3.3–19.4)
Kappa, lambda light chain ratio: 0.93 (ref 0.26–1.65)
Lambda free light chains: 36.6 mg/L — ABNORMAL HIGH (ref 5.7–26.3)

## 2024-02-20 NOTE — Telephone Encounter (Signed)
 Called and spoke with the patient's husband, [name], per [facility].  Informed him that patient's potassium has normalized to 3.8 and to continue to take potassium 3 times daily for a total of 30 mEq daily.  Also informed him that we will repeat the BMP in about 4 weeks.  [name] verbalized understanding and expressed gratitude for the call.  All questions and concerns addressed at this time.   Order for future BMP placed.

## 2024-02-20 NOTE — Telephone Encounter (Signed)
-----   Message from Barnie Hila sent at 02/20/2024  7:22 AM EST ----- Erskin Schuller,   Will you please contact the patient and let her know I reviewed latest labs (from yesterday). Her potassium has normalized to 3.8. I would like her to continue potassium three times a day for a total of 30 mEq daily. I would like to recheck BMP in 4 weeks to ensure potassium is stable.   Thank you!  DW

## 2024-02-21 ENCOUNTER — Ambulatory Visit: Admitting: Family Medicine

## 2024-02-21 ENCOUNTER — Encounter: Payer: Self-pay | Admitting: Family Medicine

## 2024-02-21 VITALS — BP 130/70 | HR 65 | Temp 97.4°F | Ht 63.0 in

## 2024-02-21 DIAGNOSIS — L989 Disorder of the skin and subcutaneous tissue, unspecified: Secondary | ICD-10-CM

## 2024-02-21 DIAGNOSIS — R251 Tremor, unspecified: Secondary | ICD-10-CM | POA: Diagnosis not present

## 2024-02-21 DIAGNOSIS — I10 Essential (primary) hypertension: Secondary | ICD-10-CM | POA: Diagnosis not present

## 2024-02-21 DIAGNOSIS — S99922A Unspecified injury of left foot, initial encounter: Secondary | ICD-10-CM | POA: Insufficient documentation

## 2024-02-21 DIAGNOSIS — F418 Other specified anxiety disorders: Secondary | ICD-10-CM | POA: Diagnosis not present

## 2024-02-21 DIAGNOSIS — R4189 Other symptoms and signs involving cognitive functions and awareness: Secondary | ICD-10-CM | POA: Diagnosis not present

## 2024-02-21 DIAGNOSIS — N3000 Acute cystitis without hematuria: Secondary | ICD-10-CM | POA: Diagnosis not present

## 2024-02-21 DIAGNOSIS — Z87440 Personal history of urinary (tract) infections: Secondary | ICD-10-CM

## 2024-02-21 DIAGNOSIS — F322 Major depressive disorder, single episode, severe without psychotic features: Secondary | ICD-10-CM | POA: Diagnosis not present

## 2024-02-21 DIAGNOSIS — D696 Thrombocytopenia, unspecified: Secondary | ICD-10-CM | POA: Diagnosis not present

## 2024-02-21 LAB — POC URINALSYSI DIPSTICK (AUTOMATED)
Bilirubin, UA: NEGATIVE
Blood, UA: 80 — AB
Glucose, UA: NEGATIVE
Ketones, UA: NEGATIVE
Nitrite, UA: NEGATIVE
Protein, UA: NEGATIVE
Spec Grav, UA: 1.015 (ref 1.010–1.025)
Urobilinogen, UA: 0.2 U/dL
pH, UA: 6 (ref 5.0–8.0)

## 2024-02-21 LAB — COMP PANEL: LEUKEMIA/LYMPHOMA

## 2024-02-21 LAB — MULTIPLE MYELOMA PANEL, SERUM
Albumin SerPl Elph-Mcnc: 2.7 g/dL — ABNORMAL LOW (ref 2.9–4.4)
Albumin/Glob SerPl: 0.8 (ref 0.7–1.7)
Alpha 1: 0.3 g/dL (ref 0.0–0.4)
Alpha2 Glob SerPl Elph-Mcnc: 0.8 g/dL (ref 0.4–1.0)
B-Globulin SerPl Elph-Mcnc: 1 g/dL (ref 0.7–1.3)
Gamma Glob SerPl Elph-Mcnc: 1.4 g/dL (ref 0.4–1.8)
Globulin, Total: 3.5 g/dL (ref 2.2–3.9)
IgA: 505 mg/dL — ABNORMAL HIGH (ref 64–422)
IgG (Immunoglobin G), Serum: 1120 mg/dL (ref 586–1602)
IgM (Immunoglobulin M), Srm: 429 mg/dL — ABNORMAL HIGH (ref 26–217)
M Protein SerPl Elph-Mcnc: 0.3 g/dL — ABNORMAL HIGH
Total Protein ELP: 6.2 g/dL (ref 6.0–8.5)

## 2024-02-21 MED ORDER — CEPHALEXIN 500 MG PO CAPS: 500.0000 mg | ORAL_CAPSULE | Freq: Three times a day (TID) | ORAL | 0 refills | Status: DC

## 2024-02-21 NOTE — Assessment & Plan Note (Signed)
 bp in fair control at this time  BP Readings from Last 1 Encounters:  02/21/24 130/70   No changes needed Most recent labs reviewed    Continues Toprol  xl 50 mg bid Lasix  20 mg daily

## 2024-02-21 NOTE — Assessment & Plan Note (Signed)
 Had visit for this and leukocytosis  Labs pending

## 2024-02-21 NOTE — Patient Instructions (Addendum)
 I sent keflex  to your pharmacy for uti and skin infection  Take it three times daily  We will reach out with your urine culture result when it returns   Keep the wound on your breast clean with soap and water twice daily (you do not need to submerge it)   You can use aquaphor, vaseline or neosporin topically to protect the area  A little gauze is ok if needed  If redness or pain increase or worsen please call   Take your lasix  per recommendation of cardiology   Follow up with psychiatry as planned   Keep doing your physical therapy    If any symptoms worsen please let me know

## 2024-02-21 NOTE — Assessment & Plan Note (Signed)
 Cymbalta  120 mg daily  Hydroxyzine prn Trazodone200 mg at bedtime Valium  5 mg bid  Struggling with depression in setting of health problems and lost independence For follow up with psychiatry as planned 2 wk

## 2024-02-21 NOTE — Assessment & Plan Note (Signed)
 In setting of possible parkinson's (did not tolerate cardiodopa) And also depression   Taking aricept 5 mg daily  Neuro care

## 2024-02-21 NOTE — Assessment & Plan Note (Signed)
 Pt notes stubbed toe (left great toe) about a week ago  Small scab today and no tenderness Will monitor

## 2024-02-21 NOTE — Assessment & Plan Note (Signed)
 Suspected parkinsons from neuro  Did not tolerate cardiodopa (gi side effects)

## 2024-02-21 NOTE — Assessment & Plan Note (Signed)
 Wound on right lower breast  Scab came off today Some redness surrounding but no drainage   Prescription keflex  500 mg tid 7d for possible infx Call back and Er precautions noted in detail today   Wound care discussed with pt and son- see avs

## 2024-02-21 NOTE — Assessment & Plan Note (Signed)
 Positive urinalysis  Malaise/low appetite  Fairly stable mental status  Generalized weakness Intermittent dysuria   Treat with keflex  500 mg tid (for this and wound)  Call back and Er precautions noted in detail today   Culture pending - will change treatment if needed

## 2024-02-21 NOTE — Progress Notes (Signed)
 Subjective:    Patient ID: Anita Harrell, female    DOB: 1951/01/01, 73 y.o.   MRN: [id number]  HPI  Wt Readings from Last 3 Encounters:  02/19/24 162 lb (73.5 kg)  02/14/24 168 lb (76.2 kg)  02/04/24 168 lb (76.2 kg)   28.70 kg/m  Vitals:   02/21/24 1236  BP: 130/70  Pulse: 65  Temp: (!) 97.4 F (36.3 C)  SpO2: 93%    Pt presents for c/o Skin tear /abrasion on right breast  Urinary concerns  Feet    Some burning to urinate on and off  Some urge incontinence  No odor she noticed /but HH did  No blood  No frequency but has urgency  Is drinking fluids   Decreased appetite   Positive urinalysis today   Stubbed left great toe a week ago  Getting better Feet were swollen- took some extra lasix  and it helped     Saw heme/onc for low plt count and leukocytosis yesterday  More labs done incl work up for monoclonal gammopathy   Ongoing back pain /lumbar radicuolpathy  Norco from spine clinic/planning another injection  Recovering from pelvic fracture also    Neuro care Cognitive impairment ? Parkinsons (cardiodopa caused GI side effects) Aricept 5 mg daily   Psychiatric care  Valium  5 mg bid  Hydroxyzine 25 mg bid (? If some possible interaction with cardiac med) Cymbalta  120 mg daily  Trazodone  200 mg at bedtime      02/21/2024   12:49 PM 07/27/2023    2:36 PM 04/03/2023    2:12 PM 03/12/2023    3:18 PM 12/04/2022    2:02 PM  Depression screen PHQ 2/9  Decreased Interest 3 3 2 2 2   Down, Depressed, Hopeless 3 3 2 1  0  PHQ - 2 Score 6 6 4 3 2   Altered sleeping 1 2 2  0 0  Tired, decreased energy 3 2 2   0  Change in appetite 3 1 1 2 1   Feeling bad or failure about yourself  3 1 2 3 3   Trouble concentrating 2 1 0 0 1  Moving slowly or fidgety/restless 3 1 0 0 1  Suicidal thoughts 1 3 0 0 0  PHQ-9 Score 22 17  11  8  8    Difficult doing work/chores Extremely dIfficult Extremely dIfficult Somewhat difficult       Data saved with a previous  flowsheet row definition      02/21/2024   12:49 PM 04/03/2023    2:12 PM 12/04/2022    2:03 PM  GAD 7 : Generalized Anxiety Score  Nervous, Anxious, on Edge 3 1 2   Control/stop worrying 3 2 3   Worry too much - different things 3 2 3   Trouble relaxing 3 0 2  Restless 0 0 0  Easily annoyed or irritable 3 1 3   Afraid - awful might happen 3 3 0  Total GAD 7 Score 18 9 13   Anxiety Difficulty Extremely difficult Somewhat difficult       A fib Cardiology Flecainide  Lasix  20 mg daily  Metoprolol  xl 50 mg bid  Eliquis  5 mg bid    HTN bp is stable today  No cp or palpitations or headaches or edema  No side effects to medicines  BP Readings from Last 3 Encounters:  02/21/24 130/70  02/19/24 110/64  02/14/24 104/64     Lab Results  Component Value Date   NA 138 02/19/2024   K 3.8 02/19/2024  CO2 26 02/19/2024   GLUCOSE 162 (H) 02/19/2024   BUN 12 02/19/2024   CREATININE 0.74 02/19/2024   CALCIUM  9.6 02/19/2024   GFR 92.08 01/29/2024   EGFR 88 02/04/2024   GFRNONAA >60 02/19/2024   On potassium    Lab Results  Component Value Date   HGBA1C 5.7 07/24/2023   HGBA1C 6.1 06/07/2021   HGBA1C 6.1 05/14/2020     Patient Active Problem List   Diagnosis Date Noted   Toe injury, left, initial encounter 02/21/2024   Cognitive impairment 02/21/2024   Tremor 02/21/2024   Easy bruising 02/19/2024   Skin lesion 02/19/2024   Dysuria 01/29/2024   Moderately severe major depression (HCC) 01/29/2024   Multiple closed pelvic fractures with disruption of pelvic circle, initial encounter (HCC) 12/16/2023   Multiple closed pelvic fractures without disruption of pelvic circle (HCC) 12/15/2023   Closed fracture of head of left radius 12/15/2023   Closed fracture of left olecranon process 12/15/2023   Lumbar compression fracture, closed, initial encounter (HCC) 12/15/2023   OSA (obstructive sleep apnea) 07/31/2023   Osteopenia 07/31/2023   COPD (chronic obstructive pulmonary  disease) (HCC) 04/03/2023   Hypophosphatemia 11/19/2022   Metabolic acidosis 11/19/2022   Chronic heart failure with preserved ejection fraction (HFpEF) (HCC) 11/18/2022   Paroxysmal atrial fibrillation (HCC) 11/18/2022   Thrombocytopenia 11/18/2022   Polypharmacy 05/17/2022   General weakness 02/16/2022   Poor balance 02/16/2022   Falling 02/16/2022   Hypokalemia 02/13/2022   HTN (hypertension) 02/13/2022   DDD (degenerative disc disease), lumbar 12/26/2021   Atrial fibrillation with RVR (HCC) 10/05/2021   Estrogen deficiency 06/07/2021   Overactive bladder 10/17/2019   Medicare annual wellness visit, subsequent 01/16/2019   Thyroid  nodule 05/01/2018   Electronic cigarette use 04/03/2018   Obesity (BMI 30-39.9) 04/03/2018   Lung mass 02/01/2018   Smokers' cough (HCC) 01/01/2018   Microscopic hematuria 08/13/2012   UTI (urinary tract infection) 07/22/2012   Routine general medical examination at a health care facility 07/22/2012   Leukocytosis 03/30/2010   Prediabetes 02/21/2008   Allergic rhinitis 07/25/2007   Low back pain 06/26/2007   Personal history of goiter 07/27/2006   Hyperlipidemia 07/27/2006   PANIC DISORDER 07/27/2006   Former smoker 07/27/2006   Depression with anxiety 07/27/2006   Insomnia 07/27/2006   History of colonic polyps 06/09/2004   Past Medical History:  Diagnosis Date   Allergy    allergic rhinitis   Anxiety    Arrhythmia    atrial fibrillation   Arthritis    Back pain    Chest pain    CHF (congestive heart failure) (HCC)    COPD (chronic obstructive pulmonary disease) (HCC)    Depression    Emphysema of lung (HCC)    Epigastric pain    GERD (gastroesophageal reflux disease)    Goiter    History of tobacco abuse    Hyperglycemia    mild   Hyperlipidemia    Hypertension    Insomnia    Hx of   Labile blood pressure    Panic disorder    History of   Personal history of colonic polyps 06/09/2004   SVD (spontaneous vaginal delivery)     x 2   Past Surgical History:  Procedure Laterality Date   ABDOMINAL HYSTERECTOMY N/A 09/24/2012   Procedure: HYSTERECTOMY ABDOMINAL;  Surgeon: Peggye Gull, MD;  Location: [facility];  Service: Gynecology;  Laterality: N/A;   BREAST BIOPSY Right 2016   CARDIOVERSION N/A 02/09/2022   Procedure: CARDIOVERSION;  Surgeon: Perla Evalene PARAS, MD;  Location: [facility];  Service: Cardiovascular;  Laterality: N/A;   CARDIOVERSION N/A 03/02/2022   Procedure: CARDIOVERSION;  Surgeon: Perla Evalene PARAS, MD;  Location: [facility];  Service: Cardiovascular;  Laterality: N/A;   COLONOSCOPY     COLONOSCOPY  2017   ELBOW SURGERY  2004   EYE SURGERY     bilateral lasik   SALPINGOOPHORECTOMY Bilateral 09/24/2012   Procedure: SALPINGO OOPHORECTOMY;  Surgeon: Peggye Gull, MD;  Location: [facility];  Service: Gynecology;  Laterality: Bilateral;   THYROID  SURGERY     B9 massess   WART FULGURATION N/A 09/24/2012   Procedure: FULGURATION VAGINAL WART;  Surgeon: Peggye Gull, MD;  Location: [facility];  Service: Gynecology;  Laterality: N/A;   WISDOM TOOTH EXTRACTION     Social History[1] Family History  Problem Relation Age of Onset   Hypertension Mother    Stroke Mother    Alcohol abuse Father    Cancer Father        lung CA   Heart disease Father        CHF   Cancer Sister        breast CA   Heart disease Sister        CHF from chemotx also has defib   Breast cancer Sister    Colon cancer Neg Hx    Esophageal cancer Neg Hx    Rectal cancer Neg Hx    Stomach cancer Neg Hx    Allergies[2] Medications Ordered Prior to Encounter[3]  Review of Systems  Constitutional:  Positive for fatigue. Negative for activity change, appetite change, fever and unexpected weight change.  HENT:  Negative for congestion, ear pain, rhinorrhea, sinus pressure and sore throat.   Eyes:  Negative for pain, redness and visual disturbance.  Respiratory:  Negative for cough, shortness of breath and wheezing.   Cardiovascular:   Positive for leg swelling. Negative for chest pain and palpitations.  Gastrointestinal:  Negative for abdominal pain, blood in stool, constipation and diarrhea.  Endocrine: Negative for polydipsia and polyuria.  Genitourinary:  Negative for dysuria, frequency and urgency.  Musculoskeletal:  Negative for arthralgias, back pain and myalgias.  Skin:  Positive for wound. Negative for pallor and rash.  Allergic/Immunologic: Negative for environmental allergies.  Neurological:  Positive for tremors and weakness. Negative for dizziness, syncope and headaches.  Hematological:  Negative for adenopathy. Does not bruise/bleed easily.  Psychiatric/Behavioral:  Positive for dysphoric mood. Negative for decreased concentration. The patient is nervous/anxious.        Objective:   Physical Exam Constitutional:      General: She is not in acute distress.    Appearance: Normal appearance. She is well-developed and normal weight. She is not ill-appearing or diaphoretic.     Comments: Frail appearing  Wheelchair bound   HENT:     Head: Normocephalic and atraumatic.     Mouth/Throat:     Mouth: Mucous membranes are moist.     Pharynx: Oropharynx is clear.  Eyes:     General: No scleral icterus.    Conjunctiva/sclera: Conjunctivae normal.     Pupils: Pupils are equal, round, and reactive to light.  Neck:     Thyroid : No thyromegaly.     Vascular: No carotid bruit or JVD.  Cardiovascular:     Rate and Rhythm: Normal rate and regular rhythm.     Heart sounds: Normal heart sounds.     No gallop.  Pulmonary:     Effort: Pulmonary  effort is normal. No respiratory distress.     Breath sounds: Normal breath sounds. No wheezing or rales.     Comments: Diffusely distant bs  Abdominal:     General: There is no distension or abdominal bruit.     Palpations: Abdomen is soft.  Genitourinary:    Comments: No masses   Wound right breast noted  Musculoskeletal:     Cervical back: Normal range of motion  and neck supple.     Right lower leg: Edema present.     Left lower leg: Edema present.     Comments: Mild edema on dorsal feet Ankles are not edematous   Toes - cool /some rubor  Lymphadenopathy:     Cervical: No cervical adenopathy.  Skin:    General: Skin is warm and dry.     Coloration: Skin is not pale.     Findings: No rash.     Comments: 2 by 3 cm superficial rectangular wound on underside of right breast  Surrounding erythema noted 2-3 cmm  Mild tenderness No drainage   Scab present on yesterday's picture is gone  Wet granulation tissue is present    Neurological:     Mental Status: She is alert.     Coordination: Coordination normal.     Deep Tendon Reflexes: Reflexes are normal and symmetric. Reflexes normal.     Comments: Generalized weakness Cannot ambulate   Head tremor Bradykinesia   Psychiatric:        Attention and Perception: Attention normal.        Mood and Affect: Mood is depressed. Affect is flat.        Speech: Speech is tangential.        Behavior: Behavior is slowed.     Comments: Candidly discusses symptoms and stressors             Assessment & Plan:   Problem List Items Addressed This Visit       Cardiovascular and Mediastinum   HTN (hypertension)   bp in fair control at this time  BP Readings from Last 1 Encounters:  02/21/24 130/70   No changes needed Most recent labs reviewed    Continues Toprol  xl 50 mg bid Lasix  20 mg daily            Musculoskeletal and Integument   Skin lesion (Chronic)   Wound on right lower breast  Scab came off today Some redness surrounding but no drainage   Prescription keflex  500 mg tid 7d for possible infx Call back and Er precautions noted in detail today   Wound care discussed with pt and son- see avs        Genitourinary   UTI (urinary tract infection) - Primary   Positive urinalysis  Malaise/low appetite  Fairly stable mental status  Generalized weakness Intermittent  dysuria   Treat with keflex  500 mg tid (for this and wound)  Call back and Er precautions noted in detail today   Culture pending - will change treatment if needed          Relevant Medications   cephALEXin  (KEFLEX ) 500 MG capsule   Other Relevant Orders   Urine Culture     Hematopoietic and Hemostatic   Thrombocytopenia (Chronic)   Had visit for this and leukocytosis  Labs pending         Other   Tremor   Suspected parkinsons from neuro  Did not tolerate cardiodopa (gi side effects)  Toe injury, left, initial encounter   Pt notes stubbed toe (left great toe) about a week ago  Small scab today and no tenderness Will monitor       Moderately severe major depression (HCC)   Hydroxyzine prn Trazodone200 mg at bedtime Valium  5 mg bid Struggling with depression in setting of health problems and lost independence For follow up with psychiatry as planned 2 wk       Depression with anxiety   Cymbalta  120 mg daily  Hydroxyzine prn Trazodone200 mg at bedtime Valium  5 mg bid  Struggling with depression in setting of health problems and lost independence For follow up with psychiatry as planned 2 wk       Cognitive impairment   In setting of possible parkinson's (did not tolerate cardiodopa) And also depression   Taking aricept 5 mg daily  Neuro care      Other Visit Diagnoses       History of UTI       Relevant Medications   cephALEXin  (KEFLEX ) 500 MG capsule   Other Relevant Orders   POCT Urinalysis Dipstick (Automated) (Completed)         [1]  Social History Tobacco Use   Smoking status: Former    Current packs/day: 0.00    Average packs/day: 1.0 packs/day    Types: E-cigarettes, Cigarettes    Quit date: 2011    Years since quitting: 14.9   Smokeless tobacco: Never  Vaping Use   Vaping status: Former   Quit date: 11/18/2022  Substance Use Topics   Alcohol use: No    Alcohol/week: 0.0 standard drinks of alcohol   Drug use: No  [2]   Allergies Allergen Reactions   Abilify [Aripiprazole] Other (See Comments)    Vision problems   Ambien  [Zolpidem ] Other (See Comments)    Hallucinations MS change    Codeine Nausea And Vomiting    Patient able to tolerate hydrocodone  and oxycodone    Lipitor [Atorvastatin] Other (See Comments)    Myalgias    Vesicare  [Solifenacin ] Other (See Comments)    MS change   Wellbutrin  [Bupropion ] Other (See Comments)    Unknown reaction  [3]  Current Outpatient Medications on File Prior to Visit  Medication Sig Dispense Refill   acetaminophen  (TYLENOL ) 500 MG tablet Take 500 mg by mouth in the morning and at bedtime.     albuterol  (VENTOLIN  HFA) 108 (90 Base) MCG/ACT inhaler Inhale 2 puffs into the lungs every 6 (six) hours as needed for wheezing or shortness of breath. 8 g 11   apixaban  (ELIQUIS ) 5 MG TABS tablet Take 5 mg by mouth 2 (two) times daily.     cyclobenzaprine  (FLEXERIL ) 10 MG tablet TAKE ONE HALF (1/2) TO ONE TABLET BY MOUTH AT BEDTIME AS NEEDED FOR MUSCLE SPASMS 30 tablet 0   diazepam  (VALIUM ) 5 MG tablet Take 1 tablet (5 mg total) by mouth in the morning and at bedtime. 10 tablet 0   donepezil (ARICEPT) 5 MG tablet Take 5 mg by mouth daily.     DULoxetine  (CYMBALTA ) 60 MG capsule Take 120 mg by mouth daily.     flecainide  (TAMBOCOR ) 100 MG tablet Take 1 tablet (100 mg total) by mouth 2 (two) times daily. 180 tablet 3   Fluticasone -Umeclidin-Vilant (TRELEGY ELLIPTA ) 100-62.5-25 MCG/ACT AEPB Inhale 1 puff into the lungs daily.     furosemide  (LASIX ) 20 MG tablet TAKE ONE TABLET (20 MG TOTAL) BY MOUTH DAILY. 90 tablet 3   hydrOXYzine (VISTARIL) 25 MG  capsule Take 25 mg by mouth 2 (two) times daily.     lidocaine  (LIDODERM ) 5 % Place 1 patch onto the skin daily. Remove & Discard patch within 12 hours or as directed by MD 10 patch 0   Melatonin 5 MG CAPS Take 10 mg by mouth at bedtime.     metoprolol  succinate (TOPROL -XL) 50 MG 24 hr tablet TAKE ONE TABLET BY MOUTH TWICE A DAY WITH  MEALS 180 tablet 0   potassium chloride  SA (KLOR-CON  M) 20 MEQ tablet Take 0.5 tablets (10 mEq total) by mouth 3 (three) times daily. 135 tablet 3   rosuvastatin  (CRESTOR ) 10 MG tablet TAKE ONE TABLET BY MOUTH ONCE A DAY 90 tablet 1   traZODone  (DESYREL ) 100 MG tablet Take 200 mg by mouth at bedtime.     VITAMIN D PO Take 1 capsule by mouth daily.     No current facility-administered medications on file prior to visit.

## 2024-02-21 NOTE — Telephone Encounter (Signed)
Addressed with pt today.

## 2024-02-21 NOTE — Assessment & Plan Note (Signed)
 Hydroxyzine prn Trazodone200 mg at bedtime Valium  5 mg bid Struggling with depression in setting of health problems and lost independence For follow up with psychiatry as planned 2 wk

## 2024-02-23 LAB — URINE CULTURE
MICRO NUMBER:: 17343950
SPECIMEN QUALITY:: ADEQUATE

## 2024-02-24 ENCOUNTER — Ambulatory Visit: Payer: Self-pay | Admitting: Family Medicine

## 2024-02-25 ENCOUNTER — Other Ambulatory Visit: Payer: Self-pay | Admitting: Family Medicine

## 2024-02-26 NOTE — Telephone Encounter (Signed)
 Last OV was on 02/21/24  last filled on 05/01/23 #30 tab/ 0 refills

## 2024-02-27 ENCOUNTER — Telehealth: Payer: Self-pay | Admitting: Family Medicine

## 2024-02-27 NOTE — Telephone Encounter (Signed)
 Left VM letting Angie know Dr. Graham comments/VO

## 2024-02-27 NOTE — Telephone Encounter (Signed)
 Okay for verbal orders.

## 2024-02-27 NOTE — Telephone Encounter (Signed)
 Copied from CRM #[id number]. Topic: Clinical - Home Health Verbal Orders >> Feb 26, 2024  4:41 PM Delon DASEN wrote: Caller/Agency: Angie with [facility] Callback Number: [phone]  Service Requested: Physical Therapy Frequency: n/a Any new concerns about the patient? Yes- Need to put Physical Therapy on hold for 3 weeks, not handling OT and PT together

## 2024-02-27 NOTE — Telephone Encounter (Signed)
 Please ok those verbal orders  Ok to hold PT if unable to do with OT Keep me posted

## 2024-03-03 DIAGNOSIS — S52122D Displaced fracture of head of left radius, subsequent encounter for closed fracture with routine healing: Secondary | ICD-10-CM | POA: Diagnosis not present

## 2024-03-03 DIAGNOSIS — E785 Hyperlipidemia, unspecified: Secondary | ICD-10-CM | POA: Diagnosis not present

## 2024-03-03 DIAGNOSIS — I5032 Chronic diastolic (congestive) heart failure: Secondary | ICD-10-CM | POA: Diagnosis not present

## 2024-03-03 DIAGNOSIS — M4982 Spondylopathy in diseases classified elsewhere, cervical region: Secondary | ICD-10-CM | POA: Diagnosis not present

## 2024-03-03 DIAGNOSIS — S22058D Other fracture of T5-T6 vertebra, subsequent encounter for fracture with routine healing: Secondary | ICD-10-CM | POA: Diagnosis not present

## 2024-03-03 DIAGNOSIS — I110 Hypertensive heart disease with heart failure: Secondary | ICD-10-CM | POA: Diagnosis not present

## 2024-03-03 DIAGNOSIS — S32810D Multiple fractures of pelvis with stable disruption of pelvic ring, subsequent encounter for fracture with routine healing: Secondary | ICD-10-CM | POA: Diagnosis not present

## 2024-03-03 DIAGNOSIS — F32A Depression, unspecified: Secondary | ICD-10-CM | POA: Diagnosis not present

## 2024-03-03 DIAGNOSIS — D7581 Myelofibrosis: Secondary | ICD-10-CM | POA: Diagnosis not present

## 2024-03-03 DIAGNOSIS — I480 Paroxysmal atrial fibrillation: Secondary | ICD-10-CM | POA: Diagnosis not present

## 2024-03-03 DIAGNOSIS — D696 Thrombocytopenia, unspecified: Secondary | ICD-10-CM | POA: Diagnosis not present

## 2024-03-03 DIAGNOSIS — J449 Chronic obstructive pulmonary disease, unspecified: Secondary | ICD-10-CM | POA: Diagnosis not present

## 2024-03-14 ENCOUNTER — Emergency Department

## 2024-03-14 ENCOUNTER — Inpatient Hospital Stay: Admission: EM | Admit: 2024-03-14 | Discharge: 2024-03-26 | DRG: 689 | Disposition: A | Discharge status: Hospice

## 2024-03-14 ENCOUNTER — Other Ambulatory Visit: Payer: Self-pay

## 2024-03-14 DIAGNOSIS — Z515 Encounter for palliative care: Secondary | ICD-10-CM | POA: Diagnosis not present

## 2024-03-14 DIAGNOSIS — Z87440 Personal history of urinary (tract) infections: Secondary | ICD-10-CM

## 2024-03-14 DIAGNOSIS — D4989 Neoplasm of unspecified behavior of other specified sites: Secondary | ICD-10-CM

## 2024-03-14 DIAGNOSIS — E049 Nontoxic goiter, unspecified: Secondary | ICD-10-CM | POA: Diagnosis present

## 2024-03-14 DIAGNOSIS — M5416 Radiculopathy, lumbar region: Secondary | ICD-10-CM | POA: Diagnosis present

## 2024-03-14 DIAGNOSIS — D62 Acute posthemorrhagic anemia: Secondary | ICD-10-CM | POA: Diagnosis present

## 2024-03-14 DIAGNOSIS — I6200 Nontraumatic subdural hemorrhage, unspecified: Secondary | ICD-10-CM | POA: Diagnosis present

## 2024-03-14 DIAGNOSIS — D696 Thrombocytopenia, unspecified: Secondary | ICD-10-CM | POA: Diagnosis present

## 2024-03-14 DIAGNOSIS — R195 Other fecal abnormalities: Secondary | ICD-10-CM | POA: Diagnosis present

## 2024-03-14 DIAGNOSIS — R7881 Bacteremia: Secondary | ICD-10-CM | POA: Diagnosis present

## 2024-03-14 DIAGNOSIS — G9341 Metabolic encephalopathy: Secondary | ICD-10-CM | POA: Diagnosis present

## 2024-03-14 DIAGNOSIS — N611 Abscess of the breast and nipple: Secondary | ICD-10-CM | POA: Diagnosis present

## 2024-03-14 DIAGNOSIS — J439 Emphysema, unspecified: Secondary | ICD-10-CM | POA: Diagnosis present

## 2024-03-14 DIAGNOSIS — F0393 Unspecified dementia, unspecified severity, with mood disturbance: Secondary | ICD-10-CM | POA: Diagnosis present

## 2024-03-14 DIAGNOSIS — F32A Depression, unspecified: Secondary | ICD-10-CM | POA: Diagnosis present

## 2024-03-14 DIAGNOSIS — Z860100 Personal history of colon polyps, unspecified: Secondary | ICD-10-CM

## 2024-03-14 DIAGNOSIS — Z7189 Other specified counseling: Secondary | ICD-10-CM | POA: Diagnosis not present

## 2024-03-14 DIAGNOSIS — K219 Gastro-esophageal reflux disease without esophagitis: Secondary | ICD-10-CM | POA: Diagnosis present

## 2024-03-14 DIAGNOSIS — L89152 Pressure ulcer of sacral region, stage 2: Secondary | ICD-10-CM | POA: Diagnosis present

## 2024-03-14 DIAGNOSIS — M4854XA Collapsed vertebra, not elsewhere classified, thoracic region, initial encounter for fracture: Secondary | ICD-10-CM | POA: Diagnosis present

## 2024-03-14 DIAGNOSIS — F410 Panic disorder [episodic paroxysmal anxiety] without agoraphobia: Secondary | ICD-10-CM | POA: Diagnosis present

## 2024-03-14 DIAGNOSIS — I110 Hypertensive heart disease with heart failure: Secondary | ICD-10-CM | POA: Diagnosis present

## 2024-03-14 DIAGNOSIS — G4733 Obstructive sleep apnea (adult) (pediatric): Secondary | ICD-10-CM | POA: Diagnosis present

## 2024-03-14 DIAGNOSIS — Z885 Allergy status to narcotic agent status: Secondary | ICD-10-CM

## 2024-03-14 DIAGNOSIS — B957 Other staphylococcus as the cause of diseases classified elsewhere: Secondary | ICD-10-CM | POA: Diagnosis present

## 2024-03-14 DIAGNOSIS — E162 Hypoglycemia, unspecified: Secondary | ICD-10-CM | POA: Diagnosis present

## 2024-03-14 DIAGNOSIS — I5032 Chronic diastolic (congestive) heart failure: Secondary | ICD-10-CM | POA: Diagnosis present

## 2024-03-14 DIAGNOSIS — Z90710 Acquired absence of both cervix and uterus: Secondary | ICD-10-CM

## 2024-03-14 DIAGNOSIS — E876 Hypokalemia: Secondary | ICD-10-CM | POA: Diagnosis present

## 2024-03-14 DIAGNOSIS — N390 Urinary tract infection, site not specified: Secondary | ICD-10-CM | POA: Diagnosis present

## 2024-03-14 DIAGNOSIS — K5900 Constipation, unspecified: Secondary | ICD-10-CM | POA: Diagnosis not present

## 2024-03-14 DIAGNOSIS — R531 Weakness: Principal | ICD-10-CM

## 2024-03-14 DIAGNOSIS — K922 Gastrointestinal hemorrhage, unspecified: Secondary | ICD-10-CM

## 2024-03-14 DIAGNOSIS — D649 Anemia, unspecified: Secondary | ICD-10-CM | POA: Diagnosis not present

## 2024-03-14 DIAGNOSIS — R569 Unspecified convulsions: Secondary | ICD-10-CM | POA: Diagnosis not present

## 2024-03-14 DIAGNOSIS — L89321 Pressure ulcer of left buttock, stage 1: Secondary | ICD-10-CM | POA: Diagnosis present

## 2024-03-14 DIAGNOSIS — Z7901 Long term (current) use of anticoagulants: Secondary | ICD-10-CM | POA: Diagnosis not present

## 2024-03-14 DIAGNOSIS — E041 Nontoxic single thyroid nodule: Secondary | ICD-10-CM | POA: Diagnosis present

## 2024-03-14 DIAGNOSIS — C864 Blastic NK-cell lymphoma not having achieved remission: Secondary | ICD-10-CM | POA: Diagnosis present

## 2024-03-14 DIAGNOSIS — R4182 Altered mental status, unspecified: Secondary | ICD-10-CM | POA: Diagnosis not present

## 2024-03-14 DIAGNOSIS — J9811 Atelectasis: Secondary | ICD-10-CM | POA: Diagnosis present

## 2024-03-14 DIAGNOSIS — Z79899 Other long term (current) drug therapy: Secondary | ICD-10-CM

## 2024-03-14 DIAGNOSIS — S22000A Wedge compression fracture of unspecified thoracic vertebra, initial encounter for closed fracture: Secondary | ICD-10-CM | POA: Diagnosis not present

## 2024-03-14 DIAGNOSIS — F0394 Unspecified dementia, unspecified severity, with anxiety: Secondary | ICD-10-CM | POA: Diagnosis present

## 2024-03-14 DIAGNOSIS — F411 Generalized anxiety disorder: Secondary | ICD-10-CM | POA: Diagnosis present

## 2024-03-14 DIAGNOSIS — M7062 Trochanteric bursitis, left hip: Secondary | ICD-10-CM | POA: Diagnosis present

## 2024-03-14 DIAGNOSIS — F03918 Unspecified dementia, unspecified severity, with other behavioral disturbance: Secondary | ICD-10-CM | POA: Diagnosis present

## 2024-03-14 DIAGNOSIS — Z7951 Long term (current) use of inhaled steroids: Secondary | ICD-10-CM

## 2024-03-14 DIAGNOSIS — D61818 Other pancytopenia: Secondary | ICD-10-CM | POA: Diagnosis present

## 2024-03-14 DIAGNOSIS — L8990 Pressure ulcer of unspecified site, unspecified stage: Secondary | ICD-10-CM | POA: Insufficient documentation

## 2024-03-14 DIAGNOSIS — Z66 Do not resuscitate: Secondary | ICD-10-CM | POA: Diagnosis present

## 2024-03-14 DIAGNOSIS — Z8249 Family history of ischemic heart disease and other diseases of the circulatory system: Secondary | ICD-10-CM

## 2024-03-14 DIAGNOSIS — R7402 Elevation of levels of lactic acid dehydrogenase (LDH): Secondary | ICD-10-CM | POA: Diagnosis present

## 2024-03-14 DIAGNOSIS — L89312 Pressure ulcer of right buttock, stage 2: Secondary | ICD-10-CM | POA: Diagnosis present

## 2024-03-14 DIAGNOSIS — I440 Atrioventricular block, first degree: Secondary | ICD-10-CM | POA: Diagnosis present

## 2024-03-14 DIAGNOSIS — S065XAA Traumatic subdural hemorrhage with loss of consciousness status unknown, initial encounter: Secondary | ICD-10-CM

## 2024-03-14 DIAGNOSIS — M1990 Unspecified osteoarthritis, unspecified site: Secondary | ICD-10-CM | POA: Diagnosis present

## 2024-03-14 DIAGNOSIS — Z8572 Personal history of non-Hodgkin lymphomas: Secondary | ICD-10-CM

## 2024-03-14 DIAGNOSIS — D472 Monoclonal gammopathy: Secondary | ICD-10-CM | POA: Diagnosis present

## 2024-03-14 DIAGNOSIS — N3000 Acute cystitis without hematuria: Secondary | ICD-10-CM | POA: Diagnosis not present

## 2024-03-14 DIAGNOSIS — Z888 Allergy status to other drugs, medicaments and biological substances status: Secondary | ICD-10-CM

## 2024-03-14 DIAGNOSIS — Z87891 Personal history of nicotine dependence: Secondary | ICD-10-CM

## 2024-03-14 DIAGNOSIS — J9611 Chronic respiratory failure with hypoxia: Secondary | ICD-10-CM | POA: Diagnosis present

## 2024-03-14 DIAGNOSIS — G8929 Other chronic pain: Secondary | ICD-10-CM | POA: Diagnosis present

## 2024-03-14 DIAGNOSIS — I959 Hypotension, unspecified: Secondary | ICD-10-CM | POA: Diagnosis not present

## 2024-03-14 DIAGNOSIS — E8809 Other disorders of plasma-protein metabolism, not elsewhere classified: Secondary | ICD-10-CM | POA: Diagnosis present

## 2024-03-14 DIAGNOSIS — C801 Malignant (primary) neoplasm, unspecified: Secondary | ICD-10-CM | POA: Diagnosis not present

## 2024-03-14 DIAGNOSIS — E785 Hyperlipidemia, unspecified: Secondary | ICD-10-CM | POA: Diagnosis present

## 2024-03-14 DIAGNOSIS — I480 Paroxysmal atrial fibrillation: Secondary | ICD-10-CM | POA: Diagnosis present

## 2024-03-14 LAB — CBC
HCT: 30.4 % — ABNORMAL LOW (ref 36.0–46.0)
Hemoglobin: 9.8 g/dL — ABNORMAL LOW (ref 12.0–15.0)
MCH: 29.4 pg (ref 26.0–34.0)
MCHC: 32.2 g/dL (ref 30.0–36.0)
MCV: 91.3 fL (ref 80.0–100.0)
Platelets: 43 K/uL — ABNORMAL LOW (ref 150–400)
RBC: 3.33 MIL/uL — ABNORMAL LOW (ref 3.87–5.11)
RDW: 16.3 % — ABNORMAL HIGH (ref 11.5–15.5)
WBC: 11.3 K/uL — ABNORMAL HIGH (ref 4.0–10.5)
nRBC: 1.2 % — ABNORMAL HIGH (ref 0.0–0.2)

## 2024-03-14 LAB — COMPREHENSIVE METABOLIC PANEL WITH GFR
ALT: 10 U/L (ref 0–44)
AST: 40 U/L (ref 15–41)
Albumin: 2.5 g/dL — ABNORMAL LOW (ref 3.5–5.0)
Alkaline Phosphatase: 119 U/L (ref 38–126)
Anion gap: 10 (ref 5–15)
BUN: 9 mg/dL (ref 8–23)
CO2: 26 mmol/L (ref 22–32)
Calcium: 9.8 mg/dL (ref 8.9–10.3)
Chloride: 103 mmol/L (ref 98–111)
Creatinine, Ser: 0.57 mg/dL (ref 0.44–1.00)
GFR, Estimated: >60 mL/min
Glucose, Bld: 73 mg/dL (ref 70–99)
Potassium: 3.7 mmol/L (ref 3.5–5.1)
Sodium: 139 mmol/L (ref 135–145)
Total Bilirubin: 0.5 mg/dL (ref 0.0–1.2)
Total Protein: 5 g/dL — ABNORMAL LOW (ref 6.5–8.1)

## 2024-03-14 LAB — URINALYSIS, ROUTINE W REFLEX MICROSCOPIC
Bilirubin Urine: NEGATIVE
Glucose, UA: NEGATIVE mg/dL
Hgb urine dipstick: NEGATIVE
Ketones, ur: NEGATIVE mg/dL
Nitrite: NEGATIVE
Protein, ur: 30 mg/dL — AB
Specific Gravity, Urine: 1.02 (ref 1.005–1.030)
pH: 5 (ref 5.0–8.0)

## 2024-03-14 LAB — TROPONIN T, HIGH SENSITIVITY: Troponin T High Sensitivity: 18 ng/L (ref 0–19)

## 2024-03-14 LAB — CBG MONITORING, ED
Glucose-Capillary: 67 mg/dL — ABNORMAL LOW (ref 70–99)
Glucose-Capillary: 98 mg/dL (ref 70–99)

## 2024-03-14 LAB — LACTIC ACID, PLASMA: Lactic Acid, Venous: 1.8 mmol/L (ref 0.5–1.9)

## 2024-03-14 LAB — TYPE AND SCREEN
ABO/RH(D): A NEG
Antibody Screen: NEGATIVE

## 2024-03-14 LAB — PROTIME-INR
INR: 1.5 — ABNORMAL HIGH (ref 0.8–1.2)
Prothrombin Time: 18.5 s — ABNORMAL HIGH (ref 11.4–15.2)

## 2024-03-14 MED ORDER — SODIUM CHLORIDE 0.9 % IV SOLN
500.0000 mg | Freq: Once | INTRAVENOUS | Status: AC
  Administered 2024-03-14: 500 mg via INTRAVENOUS
  Filled 2024-03-14: qty 5

## 2024-03-14 MED ORDER — IOHEXOL 350 MG/ML SOLN
100.0000 mL | Freq: Once | INTRAVENOUS | Status: AC | PRN
  Administered 2024-03-14: 100 mL via INTRAVENOUS

## 2024-03-14 MED ORDER — FENTANYL CITRATE (PF) 50 MCG/ML IJ SOSY
50.0000 ug | PREFILLED_SYRINGE | Freq: Once | INTRAMUSCULAR | Status: AC
  Administered 2024-03-14: 50 ug via INTRAVENOUS
  Filled 2024-03-14: qty 1

## 2024-03-14 MED ORDER — PANTOPRAZOLE SODIUM 40 MG IV SOLR
40.0000 mg | INTRAVENOUS | Status: AC
  Administered 2024-03-14 (×2): 40 mg via INTRAVENOUS
  Filled 2024-03-14: qty 10

## 2024-03-14 MED ORDER — SODIUM CHLORIDE 0.9 % IV BOLUS
500.0000 mL | Freq: Once | INTRAVENOUS | Status: AC
  Administered 2024-03-14: 500 mL via INTRAVENOUS

## 2024-03-14 MED ORDER — SODIUM CHLORIDE 0.9 % IV BOLUS
1000.0000 mL | Freq: Once | INTRAVENOUS | Status: AC
  Administered 2024-03-14: 1000 mL via INTRAVENOUS

## 2024-03-14 MED ORDER — SODIUM CHLORIDE 0.9 % IV SOLN
1.0000 g | Freq: Once | INTRAVENOUS | Status: AC
  Administered 2024-03-14: 1 g via INTRAVENOUS
  Filled 2024-03-14: qty 10

## 2024-03-14 MED ORDER — NOREPINEPHRINE 4 MG/250ML-% IV SOLN: 0.0000 ug/min | INTRAVENOUS | Status: DC

## 2024-03-14 MED ORDER — ALBUMIN HUMAN 25 % IV SOLN
25.0000 g | Freq: Once | INTRAVENOUS | Status: AC
  Administered 2024-03-14: 25 g via INTRAVENOUS
  Filled 2024-03-14: qty 100

## 2024-03-14 MED ORDER — LACTATED RINGERS IV BOLUS
1000.0000 mL | Freq: Once | INTRAVENOUS | Status: AC
  Administered 2024-03-14: 1000 mL via INTRAVENOUS

## 2024-03-14 MED ORDER — PANTOPRAZOLE SODIUM 40 MG IV SOLR: 40.0000 mg | Freq: Two times a day (BID) | INTRAVENOUS | Status: DC

## 2024-03-14 NOTE — ED Provider Notes (Signed)
 "  [facility] Provider Note    Event Date/Time   First MD Initiated Contact with Patient 03/14/24 1630     (approximate)   History   Weakness   HPI  Anita Harrell is a 74 y.o. female who presents to the emergency department today because of concerns for both weakness, increased confusion as well as continued neck and back pain.  Patient states that she has history of neck pain and has been getting injections.  The after her most recent injection she feels like she has got a crick in her neck.  Will improve after massage.  Family however states they have not noticed the patient has been weaker than normal over the past 2 to 3 days.  Additionally they have noticed some increased confusion.  It is typically worse in the evening.  No reported fevers.     Physical Exam   Triage Vital Signs: ED Triage Vitals  Encounter Vitals Group     BP 03/14/24 1618 (!) 111/53     Girls Systolic BP Percentile --      Girls Diastolic BP Percentile --      Boys Systolic BP Percentile --      Boys Diastolic BP Percentile --      Pulse Rate 03/14/24 1618 70     Resp 03/14/24 1618 18     Temp 03/14/24 1618 (!) 97.4 F (36.3 C)     Temp Source 03/14/24 1618 Oral     SpO2 03/14/24 1618 94 %     Weight 03/14/24 1626 161 lb (73 kg)     Height 03/14/24 1626 5' 3 (1.6 m)     Head Circumference --      Peak Flow --      Pain Score 03/14/24 1623 0     Pain Loc --      Pain Education --      Exclude from Growth Chart --     Most recent vital signs: Vitals:   03/14/24 1635 03/14/24 1640  BP:  (!) 104/38  Pulse:  69  Resp:  12  Temp:    SpO2: 99% 99%   General: Awake, alert, oriented. CV:  Good peripheral perfusion. Regular rate and rhythm. Resp:  Normal effort. Lungs clear. Abd:  No distention.  Rectal:  Brown stool on glove. GUIAC positive.  ED Results / Procedures / Treatments   Labs (all labs ordered are listed, but only abnormal results are displayed) Labs  Reviewed  COMPREHENSIVE METABOLIC PANEL WITH GFR - Abnormal; Notable for the following components:      Result Value   Total Protein 5.0 (*)    Albumin 2.5 (*)    All other components within normal limits  CBC - Abnormal; Notable for the following components:   WBC 11.3 (*)    RBC 3.33 (*)    Hemoglobin 9.8 (*)    HCT 30.4 (*)    RDW 16.3 (*)    Platelets 43 (*)    nRBC 1.2 (*)    All other components within normal limits  URINALYSIS, ROUTINE W REFLEX MICROSCOPIC - Abnormal; Notable for the following components:   Color, Urine AMBER (*)    APPearance CLOUDY (*)    Protein, ur 30 (*)    Leukocytes,Ua TRACE (*)    Bacteria, UA MANY (*)    Non Squamous Epithelial PRESENT (*)    All other components within normal limits  PROTIME-INR - Abnormal; Notable for the following components:  Prothrombin Time 18.5 (*)    INR 1.5 (*)    All other components within normal limits  CBG MONITORING, ED - Abnormal; Notable for the following components:   Glucose-Capillary 67 (*)    All other components within normal limits  URINE CULTURE  CULTURE, BLOOD (ROUTINE X 2)  CULTURE, BLOOD (ROUTINE X 2)  LACTIC ACID, PLASMA  HEMOGLOBIN AND HEMATOCRIT, BLOOD  CBG MONITORING, ED  TYPE AND SCREEN  TROPONIN T, HIGH SENSITIVITY     EKG  I, Guadalupe Eagles, attending physician, personally viewed and interpreted this EKG  EKG Time: 1623 Rate: 69 Rhythm: sinus rhythm Axis: normal Intervals: qtc 456 QRS: low voltage ST changes: no st elevation Impression: abnormal ekg  RADIOLOGY None  PROCEDURES:  Critical Care performed: Yes  CRITICAL CARE Performed by: Guadalupe Eagles   Total critical care time: 30 minutes  Critical care time was exclusive of separately billable procedures and treating other patients.  Critical care was necessary to treat or prevent imminent or life-threatening deterioration.  Critical care was time spent personally by me on the following activities: development  of treatment plan with patient and/or surrogate as well as nursing, discussions with consultants, evaluation of patient's response to treatment, examination of patient, obtaining history from patient or surrogate, ordering and performing treatments and interventions, ordering and review of laboratory studies, ordering and review of radiographic studies, pulse oximetry and re-evaluation of patient's condition.   Procedures    MEDICATIONS ORDERED IN ED: Medications - No data to display   IMPRESSION / MDM / ASSESSMENT AND PLAN / ED COURSE  I reviewed the triage vital signs and the nursing notes.                              Differential diagnosis includes, but is not limited to, anemia, dehydration, infection, ACS  Patient's presentation is most consistent with acute presentation with potential threat to life or bodily function.   The patient is on the cardiac monitor to evaluate for evidence of arrhythmia and/or significant heart rate changes.  Patient presented to the emergency department today because of concerns for weakness and some increased confusion over the past few days.  Patient also has concerns for continued neck pain.  On exam patient is awake and alert however not completely oriented.  Blood work was notable for anemia.  Somewhat significant decrease from labs 1 month ago.  Did perform a rectal exam which had brown stool on the glove but was guaiac positive.  Because of this Protonix was ordered.  Additionally urinalysis is concerning for urinary tract infection.  She was started on antibiotics.  While here her blood pressure was low however responded well to IV fluid bolus.  It then went low again.  Patient was given further IV fluids.  At this time it does seem that she is fairly fluid responsive.  Will plan on admission to the hospitalist service.        FINAL CLINICAL IMPRESSION(S) / ED DIAGNOSES   Final diagnoses:  Weakness  Anemia, unspecified type  Gastrointestinal  hemorrhage, unspecified gastrointestinal hemorrhage type  Lower urinary tract infectious disease        Note:  This document was prepared using Dragon voice recognition software and may include unintentional dictation errors.    Eagles Guadalupe, MD 03/15/24 0001  "

## 2024-03-14 NOTE — H&P (Addendum)
 " History and Physical    Patient: Anita Harrell FMW:[id number] DOB: 01-27-1951 DOA: 03/14/2024 DOS: the patient was seen and examined on 03/15/2024 PCP: Randeen [name], MD  Patient coming from: Home  Chief Complaint:  Chief Complaint  Patient presents with   Weakness   HPI: Anita Harrell is a 74 y.o. female with medical history significant of chronic HFpEF, paroxysmal A-fib, HLD, dementia, depression, and generalized anxiety disorder, chronic neck and back pain, OSA presented to ER due to weakness, increased confusion, neck and back pain. Husband reports having history of neck pain and has been getting injections (ESI).  Weakness gradually worsening over the last couple of weeks, increased confusion.  Denies fever/chills, chest pain, SOB Recently finished treatment of E.coli UTI with Keflex  2 weeks ago  On presentation patient found to be afebrile, RR in 20s, BP soft with MAP 58, improved s/p IV fluids and IV Albumin  Workup revealed CMP with hypoalbuminemia, otherwise unremarkable.  Lactic acid not elevated CBC shows mild leukocytosis 11.3, Hb 9.8 normocytic, thrombocytopenia with platelet count 43 Initial troponin 18, Was found to be hypoglycemic with blood glucose 67 UA trace leukocytes, proteinuria, 11-20 wbc, urine and blood culture sent CXR shows new left basilar infiltration/atelectasis and probable small left pleural effusion CTA with no evidence of aneurysm/dissection.  New vertebral endplate compression fractures at T6 and T8. Dependent lower lobe opacities, left > right, favor atelectasis EKG shows sinus rhythm with first-degree AV block  Received IV ceftriaxone , azithromycin  in ED, IV PPI, bolus 2.5 L fluids in ER    Review of Systems: As mentioned in the history of present illness. All other systems reviewed and are negative. Past Medical History:  Diagnosis Date   Allergy    allergic rhinitis   Anxiety    Arrhythmia    atrial fibrillation   Arthritis    Back  pain    Chest pain    CHF (congestive heart failure) (HCC)    COPD (chronic obstructive pulmonary disease) (HCC)    Depression    Emphysema of lung (HCC)    Epigastric pain    GERD (gastroesophageal reflux disease)    Goiter    History of tobacco abuse    Hyperglycemia    mild   Hyperlipidemia    Hypertension    Insomnia    Hx of   Labile blood pressure    Panic disorder    History of   Personal history of colonic polyps 06/09/2004   SVD (spontaneous vaginal delivery)    x 2   Past Surgical History:  Procedure Laterality Date   ABDOMINAL HYSTERECTOMY N/A 09/24/2012   Procedure: HYSTERECTOMY ABDOMINAL;  Surgeon: Peggye Gull, MD;  Location: [facility];  Service: Gynecology;  Laterality: N/A;   BREAST BIOPSY Right 2016   CARDIOVERSION N/A 02/09/2022   Procedure: CARDIOVERSION;  Surgeon: Perla Evalene JINNY, MD;  Location: [facility];  Service: Cardiovascular;  Laterality: N/A;   CARDIOVERSION N/A 03/02/2022   Procedure: CARDIOVERSION;  Surgeon: Perla Evalene JINNY, MD;  Location: [facility];  Service: Cardiovascular;  Laterality: N/A;   COLONOSCOPY     COLONOSCOPY  2017   ELBOW SURGERY  2004   EYE SURGERY     bilateral lasik   SALPINGOOPHORECTOMY Bilateral 09/24/2012   Procedure: SALPINGO OOPHORECTOMY;  Surgeon: Peggye Gull, MD;  Location: [facility];  Service: Gynecology;  Laterality: Bilateral;   THYROID  SURGERY     B9 massess   WART FULGURATION N/A 09/24/2012   Procedure: FULGURATION VAGINAL WART;  Surgeon: Peggye Gull, MD;  Location: [facility];  Service: Gynecology;  Laterality: N/A;   WISDOM TOOTH EXTRACTION     Social History:  reports that she quit smoking about 15 years ago. Her smoking use included e-cigarettes and cigarettes. She smoked an average of 1 pack per day. She has never used smokeless tobacco. She reports that she does not drink alcohol and does not use drugs.  Allergies[1]  Family History  Problem Relation Age of Onset   Hypertension Mother    Stroke Mother    Alcohol  abuse Father    Cancer Father        lung CA   Heart disease Father        CHF   Cancer Sister        breast CA   Heart disease Sister        CHF from chemotx also has defib   Breast cancer Sister    Colon cancer Neg Hx    Esophageal cancer Neg Hx    Rectal cancer Neg Hx    Stomach cancer Neg Hx     Prior to Admission medications  Medication Sig Start Date End Date Taking? Authorizing Provider  acetaminophen  (TYLENOL ) 500 MG tablet Take 500 mg by mouth in the morning and at bedtime.    [provider]  albuterol  (VENTOLIN  HFA) 108 (90 Base) MCG/ACT inhaler Inhale 2 puffs into the lungs every 6 (six) hours as needed for wheezing or shortness of breath. 05/25/23   Tamea Dedra CROME, MD  apixaban  (ELIQUIS ) 5 MG TABS tablet Take 5 mg by mouth 2 (two) times daily.    [provider]  cephALEXin  (KEFLEX ) 500 MG capsule Take 1 capsule (500 mg total) by mouth 3 (three) times daily. 02/21/24   Tower, [name], MD  cyclobenzaprine  (FLEXERIL ) 10 MG tablet TAKE ONE HALF (1/2) TO ONE TABLET BY MOUTH AT BEDTIME AS NEEDED FOR MUSCLE SPASMS 02/26/24   Tower, [name], MD  diazepam  (VALIUM ) 5 MG tablet Take 1 tablet (5 mg total) by mouth in the morning and at bedtime. 12/19/23   Gherghe, Costin M, MD  donepezil  (ARICEPT ) 5 MG tablet Take 5 mg by mouth daily. 12/04/23   [provider]  DULoxetine  (CYMBALTA ) 60 MG capsule Take 120 mg by mouth daily. 04/11/22   [provider]  flecainide  (TAMBOCOR ) 100 MG tablet Take 1 tablet (100 mg total) by mouth 2 (two) times daily. 12/19/22   Gollan, Timothy J, MD  Fluticasone -Umeclidin-Vilant (TRELEGY ELLIPTA ) 100-62.5-25 MCG/ACT AEPB Inhale 1 puff into the lungs daily.    [provider]  furosemide  (LASIX ) 20 MG tablet TAKE ONE TABLET (20 MG TOTAL) BY MOUTH DAILY. 12/05/23   Gollan, Timothy J, MD  hydrOXYzine  (VISTARIL ) 25 MG capsule Take 25 mg by mouth 2 (two) times daily. 04/11/22   Doles-Johnson, Teah, NP  lidocaine   (LIDODERM ) 5 % Place 1 patch onto the skin daily. Remove & Discard patch within 12 hours or as directed by MD 12/20/23   Gherghe, Costin M, MD  Melatonin 5 MG CAPS Take 10 mg by mouth at bedtime.    [provider]  metoprolol  succinate (TOPROL -XL) 50 MG 24 hr tablet TAKE ONE TABLET BY MOUTH TWICE A DAY WITH MEALS 01/04/24   Gollan, Timothy J, MD  potassium chloride  SA (KLOR-CON  M) 20 MEQ tablet Take 0.5 tablets (10 mEq total) by mouth 3 (three) times daily. 02/05/24   Loistine Sober, NP  rosuvastatin  (CRESTOR ) 10 MG tablet TAKE  ONE TABLET BY MOUTH ONCE A DAY 10/10/23   Tower, [name], MD  traZODone  (DESYREL ) 100 MG tablet Take 200 mg by mouth at bedtime. 03/05/23   [provider]  VITAMIN D PO Take 1 capsule by mouth daily.    [provider]    Physical Exam: Vitals:   03/15/24 0030 03/15/24 0100 03/15/24 0130 03/15/24 0140  BP: (!) 122/106 (!) 110/42 (!) 118/57 (!) 108/57  Pulse: 80 80 81 81  Resp: 13 20 (!) 21 (!) 22  Temp:      TempSrc:      SpO2: 96% 100% 100% 100%  Weight:      Height:       General exam: Appears calm and comfortable, NAD Respiratory system: bibasilar crackles +, no wheezing Cardiovascular system: S1 & S2 heard, RRR.  Gastrointestinal system: Abdomen is nondistended, soft and nontender.  Neuro: Aox 1. No focal neurological deficits. Extremities: Symmetric, expected ROM Skin: No rashes, lesions  Data Reviewed: Labs and Imaging reviewed  Assessment and Plan: UTI Was hypotensive on presentation, BP improved with IV fluids and IV albumin  finished treatment of E.coli UTI with Keflex  2 weeks ago Continue IV ceftriaxone  Follow-up blood cultures, urine culture  T6 and T8 compression fracture Neck and back pain CT with new vertebral endplate compression fractures at T6 and T8 CT C-spine pending Consult neurosurgery, notified Dr.Smith Pain management Tylenol , low-dose oxycodone   Acute blood loss anemia Hb dropped from 12.9 ->  9.8 in less than 1 month Family denies melena/bright red blood per rectum guaiac positive per ED physician Sent Iron studies, FOBT IV PPI bid, Hold Eliquis  Gastroenterology consult, notified Dr.Vanga Monitor Hb, transfuse if Hb < 7  Thrombocytopenia Monitor platelet count  Hypoglycemia Suspect due to poor p.o. intake, monitor CBG q4  Chronic HFpEF S/p IV fluid bolus in ER Check BNP, Home lasix  on hold  Paroxysmal A-fib Continue flecainide , resume metoprolol  as HR/BP tolerates Hold Eliquis  due to drop in hemoglobin  Dementia Continue Aricept   Depression Generalized anxiety disorder Continue Cymbalta , hydroxyzine   -- PT/OT eval    Advance Care Planning:   Code Status: Limited: Do not attempt resuscitation (DNR) -DNR-LIMITED -Do Not Intubate/DNI  -discussed with husband at the bedside  Consults: Gastroenterology, neurosurgery  Family Communication: Husband at the bedside  Severity of Illness: The appropriate patient status for this patient is INPATIENT. Inpatient status is judged to be reasonable and necessary in order to provide the required intensity of service to ensure the patient's safety. The patient's presenting symptoms, physical exam findings, and initial radiographic and laboratory data in the context of their chronic comorbidities is felt to place them at high risk for further clinical deterioration. Furthermore, it is not anticipated that the patient will be medically stable for discharge from the hospital within 2 midnights of admission.   * I certify that at the point of admission it is my clinical judgment that the patient will require inpatient hospital care spanning beyond 2 midnights from the point of admission due to high intensity of service, high risk for further deterioration and high frequency of surveillance required.*  Author: Laree Lock, MD 03/15/2024 2:00 AM  For on call review [email or web address].      [1]  Allergies Allergen Reactions    Abilify [Aripiprazole] Other (See Comments)    Vision problems   Ambien  [Zolpidem ] Other (See Comments)    Hallucinations MS change    Codeine Nausea And Vomiting    Patient able to tolerate hydrocodone  and oxycodone   Lipitor [Atorvastatin] Other (See Comments)    Myalgias    Vesicare  [Solifenacin ] Other (See Comments)    MS change   Wellbutrin  [Bupropion ] Other (See Comments)    Unknown reaction   "

## 2024-03-14 NOTE — ED Triage Notes (Signed)
 Pt arrives via [facility] from home with c/o weakness that started today. Per husband pt has been gradually getting weaker over the last couple of weeks and wasn't able to stand and pivot today like they are normally able to. Pt is A&Ox4 during triage.

## 2024-03-14 NOTE — ED Notes (Signed)
Pt given orange juice for hypoglycemia

## 2024-03-14 NOTE — ED Notes (Signed)
 Pt is CAOx4 however appears very lethargic. Pt's son advised this started this morning. Pt stated that she got a medication shot in her neck from her doctor and wonders if that is why she is so weak.

## 2024-03-14 NOTE — ED Triage Notes (Signed)
 First nurse note: Per EMT report, family reports patient is less mobile and more quiet than usual. Per report, patient has a history of dementia and is usually mobile and loud. Per report, patient c/o neck pain, headache and back pain. Per report, patient recently had an injection  in the neck for pain. Per report, patient is A&O x1.  100/60 66 pulse 96% on 2L which is what the patient is on at home.

## 2024-03-15 ENCOUNTER — Inpatient Hospital Stay

## 2024-03-15 DIAGNOSIS — N3000 Acute cystitis without hematuria: Secondary | ICD-10-CM | POA: Diagnosis not present

## 2024-03-15 LAB — BLOOD CULTURE ID PANEL (REFLEXED) - BCID2

## 2024-03-15 LAB — BASIC METABOLIC PANEL WITH GFR
Anion gap: 8 (ref 5–15)
BUN: 6 mg/dL — ABNORMAL LOW (ref 8–23)
CO2: 27 mmol/L (ref 22–32)
Calcium: 9.8 mg/dL (ref 8.9–10.3)
Chloride: 104 mmol/L (ref 98–111)
Creatinine, Ser: 0.48 mg/dL (ref 0.44–1.00)
GFR, Estimated: >60 mL/min
Glucose, Bld: 97 mg/dL (ref 70–99)
Potassium: 3.3 mmol/L — ABNORMAL LOW (ref 3.5–5.1)
Sodium: 139 mmol/L (ref 135–145)

## 2024-03-15 LAB — CBC
HCT: 27.2 % — ABNORMAL LOW (ref 36.0–46.0)
Hemoglobin: 8.6 g/dL — ABNORMAL LOW (ref 12.0–15.0)
MCH: 29.2 pg (ref 26.0–34.0)
MCHC: 31.6 g/dL (ref 30.0–36.0)
MCV: 92.2 fL (ref 80.0–100.0)
Platelets: 35 K/uL — ABNORMAL LOW (ref 150–400)
RBC: 2.95 MIL/uL — ABNORMAL LOW (ref 3.87–5.11)
RDW: 16.4 % — ABNORMAL HIGH (ref 11.5–15.5)
WBC: 10 K/uL (ref 4.0–10.5)
nRBC: 0.6 % — ABNORMAL HIGH (ref 0.0–0.2)

## 2024-03-15 LAB — PRO BRAIN NATRIURETIC PEPTIDE: Pro Brain Natriuretic Peptide: 1237 pg/mL — ABNORMAL HIGH

## 2024-03-15 LAB — GLUCOSE, CAPILLARY: Glucose-Capillary: 110 mg/dL — ABNORMAL HIGH (ref 70–99)

## 2024-03-15 LAB — IRON AND TIBC
Iron: 43 ug/dL (ref 28–170)
Saturation Ratios: 32 % — ABNORMAL HIGH (ref 10.4–31.8)
TIBC: 135 ug/dL — ABNORMAL LOW (ref 250–450)
UIBC: 92 ug/dL

## 2024-03-15 LAB — CBG MONITORING, ED: Glucose-Capillary: 93 mg/dL (ref 70–99)

## 2024-03-15 LAB — FERRITIN: Ferritin: 358 ng/mL — ABNORMAL HIGH (ref 11–307)

## 2024-03-15 MED ORDER — FUROSEMIDE 40 MG PO TABS: 20.0000 mg | ORAL_TABLET | Freq: Every day | ORAL | Status: DC

## 2024-03-15 MED ORDER — FLECAINIDE ACETATE 100 MG PO TABS
100.0000 mg | ORAL_TABLET | Freq: Two times a day (BID) | ORAL | Status: DC
  Administered 2024-03-15 – 2024-03-26 (×24): 100 mg via ORAL
  Filled 2024-03-15 (×25): qty 1

## 2024-03-15 MED ORDER — OXYCODONE HCL 5 MG PO TABS
2.5000 mg | ORAL_TABLET | ORAL | Status: DC | PRN
  Administered 2024-03-15 – 2024-03-16 (×2): 2.5 mg via ORAL
  Filled 2024-03-15 (×2): qty 1

## 2024-03-15 MED ORDER — HYDROXYZINE HCL 25 MG PO TABS
25.0000 mg | ORAL_TABLET | Freq: Two times a day (BID) | ORAL | Status: DC
  Administered 2024-03-15 – 2024-03-26 (×23): 25 mg via ORAL
  Filled 2024-03-15 (×23): qty 1

## 2024-03-15 MED ORDER — MELATONIN 5 MG PO TABS
10.0000 mg | ORAL_TABLET | Freq: Every day | ORAL | Status: DC
  Administered 2024-03-15 – 2024-03-25 (×11): 10 mg via ORAL
  Filled 2024-03-15 (×13): qty 2

## 2024-03-15 MED ORDER — ALBUTEROL SULFATE (2.5 MG/3ML) 0.083% IN NEBU: 2.5000 mg | INHALATION_SOLUTION | Freq: Four times a day (QID) | RESPIRATORY_TRACT | Status: DC | PRN

## 2024-03-15 MED ORDER — PANTOPRAZOLE SODIUM 40 MG IV SOLR
40.0000 mg | Freq: Two times a day (BID) | INTRAVENOUS | Status: DC
  Administered 2024-03-15 – 2024-03-19 (×10): 40 mg via INTRAVENOUS
  Filled 2024-03-15 (×11): qty 10

## 2024-03-15 MED ORDER — SODIUM CHLORIDE 0.9% FLUSH
3.0000 mL | Freq: Two times a day (BID) | INTRAVENOUS | Status: DC
  Administered 2024-03-15 – 2024-03-20 (×11): 3 mL via INTRAVENOUS

## 2024-03-15 MED ORDER — ENSURE PLUS HIGH PROTEIN PO LIQD
237.0000 mL | Freq: Two times a day (BID) | ORAL | Status: DC
  Administered 2024-03-16 – 2024-03-26 (×12): 237 mL via ORAL

## 2024-03-15 MED ORDER — DULOXETINE HCL 60 MG PO CPEP: 120.0000 mg | ORAL_CAPSULE | Freq: Every day | ORAL | Status: DC

## 2024-03-15 MED ORDER — ACETAMINOPHEN 325 MG PO TABS
650.0000 mg | ORAL_TABLET | Freq: Four times a day (QID) | ORAL | Status: DC | PRN
  Administered 2024-03-20 – 2024-03-25 (×3): 650 mg via ORAL
  Filled 2024-03-15 (×3): qty 2

## 2024-03-15 MED ORDER — BUDESON-GLYCOPYRROL-FORMOTEROL 160-9-4.8 MCG/ACT IN AERO
2.0000 | INHALATION_SPRAY | Freq: Two times a day (BID) | RESPIRATORY_TRACT | Status: DC
  Administered 2024-03-16 – 2024-03-26 (×20): 2 via RESPIRATORY_TRACT
  Filled 2024-03-15: qty 5.9

## 2024-03-15 MED ORDER — ROSUVASTATIN CALCIUM 10 MG PO TABS
10.0000 mg | ORAL_TABLET | Freq: Every day | ORAL | Status: DC
  Administered 2024-03-15 – 2024-03-26 (×12): 10 mg via ORAL
  Filled 2024-03-15 (×12): qty 1

## 2024-03-15 MED ORDER — DONEPEZIL HCL 5 MG PO TABS
5.0000 mg | ORAL_TABLET | Freq: Every day | ORAL | Status: DC
  Administered 2024-03-15 – 2024-03-26 (×12): 5 mg via ORAL
  Filled 2024-03-15 (×12): qty 1

## 2024-03-15 MED ORDER — POTASSIUM CHLORIDE CRYS ER 20 MEQ PO TBCR
40.0000 meq | EXTENDED_RELEASE_TABLET | Freq: Once | ORAL | Status: AC
  Administered 2024-03-15: 40 meq via ORAL
  Filled 2024-03-15: qty 2

## 2024-03-15 MED ORDER — SODIUM CHLORIDE 0.9 % IV SOLN
1.0000 g | INTRAVENOUS | Status: DC
  Administered 2024-03-15 – 2024-03-17 (×3): 1 g via INTRAVENOUS
  Filled 2024-03-15 (×4): qty 10

## 2024-03-15 NOTE — ED Notes (Signed)
 Pt soiled bed at this time. Purewick appears to be out of place. Linens, breif and chux pad changed. Purewick replaced. Pt resting comfortably at this time.

## 2024-03-15 NOTE — Progress Notes (Addendum)
 "    Progress Note    Anita Harrell  FMW:[id number] DOB: 07/15/1950  DOA: 03/14/2024 PCP: Randeen [name], MD      Brief Narrative:    Medical records reviewed and are as summarized below:  Anita Harrell is a 74 y.o. female  with medical history significant of chronic HFpEF, paroxysmal A-fib, HLD, dementia, depression, and generalized anxiety disorder, chronic neck and back pain, OSA, who presented to the ED because of general weakness, increasing confusion, neck and back pain.  He has chronic neck and back pain/lumbar radiculopathy and recently got steroid injection on 03/03/2024.  Since discharge from the rehab facility about a week before Thanksgiving, patient's condition has declined.  She has gotten weaker and weaker and she has been intermittently confused and confusion is typically worse in the evenings.  Husband and family has become increasingly difficult to take care of her at home.  She is to be able to ambulate with a walker.  She is now unable to ambulate.  Recently got 2 rounds of antibiotics for UTI.  She was prescribed 5 days of nitrofurantoin by her neurologist on 01/24/2024.  She was given second round of antibiotics with Keflex  on 02/21/2024.  Urine culture from 02/21/2024 showed pansensitive E. coli.   Vitals in the ED: Temperature 97.4, respiratory rate 12, BP 104/38, oxygen  saturation 99% on 2 L oxygen .   CT angio chest/abdomen and pelvis IMPRESSION: 1. Coronary artery, aortic atherosclerosis. No evidence of aneurysm or dissection. 2. New vertebral endplate compression fractures at T6 and T8 since prior study. 3. Dependent lower lobe opacities, left greater than right, favored atelectasis, although left lower lobe pneumonia is not excluded.    CT cervical spine IMPRESSION: 1. Mild degenerative disc disease without spinal canal stenosis. 2. Multilevel facet arthrosis without high-grade foraminal stenosis.     Assessment/Plan:   Principal Problem:    UTI (urinary tract infection)    Body mass index is 28.52 kg/m.  Suspected UTI: Continue IV ceftriaxone  for now.  Plan to treat for over 3 days.  Follow-up urine culture.  Recently treated for UTI on 2 occasions. She was given a prescription for Keflex  for UTI and right breast wound on 02/21/2024.  Urine culture from 02/21/2024 showed pansensitive E. coli. Patient was given a prescription for nitrofurantoin for 5 days on 01/24/2024 when she saw Dr. Maree, neurologist, in the office.   Dependent lower lobe opacities: Unable to exclude pneumonia.  No respiratory symptoms at this time.  She uses 2 L oxygen  at home as needed for chronic hypoxic respiratory failure.   Hypotension: BP improved with IV fluids.   Positive heme stools, acute anemia: Hemoglobin down from 9.8-8.6.  Hemoglobin was 12.9 on 02/19/2024.  Unknown if this is due to GI blood loss.  Continue IV Protonix .  Eliquis  has been held.  Case discussed with Dr. Unk, gastroenterologist on-call.  She has no plan for endoscopic evaluation at this time given significant thrombocytopenia and lack of overt bleeding and absence of iron deficiency anemia. Patient can follow-up with gastroenterologist as an outpatient. Iron studies inconsistent with iron deficiency anemia: Iron 43, TIBC 135, saturation ratio 32, ferritin 358  Acute on chronic thrombocytopenia: Platelet down from 43 to 35. Platelet was 112 on 02/19/2024. Consulted Dr. Melanee, hematologist.  Of note, patient follows with Dr. Babara as an outpatient and it appears outpatient workup is in progress   T6 and T8 fracture: Analgesics as needed for pain.  Neurosurgery consult is pending. Chronic  neck and back pain, chronic lumbar radiculopathy and greater trochanteric bursitis of left hip: Recent steroid injection for lumbar radiculopathy on 03/03/2024.   General Weakness: PT and OT evaluation.   Hypokalemia: Replete potassium and monitor levels   Dementia: Continue supportive  care. He sees Dr. Maree, neurologist, as an outpatient.   S/p hypoglycemia: Improved    Chronic HFpEF: Compensated. proBNP 1237.   Paroxysmal atrial fibrillation: Eliquis  has been held. Continue flecainide    Comorbidities include depression, generalized anxiety disorder, solitary right thyroid  nodule   Diet Order             Diet Heart Fluid consistency: Thin  Diet effective now                                  Consultants: Neurosurgeon Gastroenterology  Procedures: None    Medications:    budesonide -glycopyrrolate -formoterol   2 puff Inhalation BID   donepezil   5 mg Oral Daily   [START ON 03/16/2024] feeding supplement  237 mL Oral BID BM   flecainide   100 mg Oral BID   hydrOXYzine   25 mg Oral BID   melatonin  10 mg Oral QHS   pantoprazole  (PROTONIX ) IV  40 mg Intravenous Q12H   rosuvastatin   10 mg Oral Daily   sodium chloride  flush  3 mL Intravenous Q12H   Continuous Infusions:  cefTRIAXone  (ROCEPHIN )  IV       Anti-infectives (From admission, onward)    Start     Dose/Rate Route Frequency Ordered Stop   03/15/24 1730  cefTRIAXone  (ROCEPHIN ) 1 g in sodium chloride  0.9 % 100 mL IVPB        1 g 200 mL/hr over 30 Minutes Intravenous Every 24 hours 03/15/24 0147     03/14/24 2300  azithromycin  (ZITHROMAX ) 500 mg in sodium chloride  0.9 % 250 mL IVPB        500 mg 250 mL/hr over 60 Minutes Intravenous  Once 03/14/24 2255 03/15/24 0021   03/14/24 1730  cefTRIAXone  (ROCEPHIN ) 1 g in sodium chloride  0.9 % 100 mL IVPB        1 g 200 mL/hr over 30 Minutes Intravenous  Once 03/14/24 1720 03/14/24 1758              Family Communication/Anticipated D/C date and plan/Code Status   DVT prophylaxis: SCDs Start: 03/15/24 0143     Code Status: Limited: Do not attempt resuscitation (DNR) -DNR-LIMITED -Do Not Intubate/DNI   Family Communication: Plan discussed with her husband at the bedside Disposition Plan: May need to be discharged to  SNF   Status is: Inpatient Remains inpatient appropriate because: General weakness       Subjective:   Interval events noted.  No complaints.  No pain reported.  No shortness of breath or chest pain.  No dizziness.  No rectal bleeding or hematemesis.  Husband at the bedside.  Husband reports that patient's condition has declined since discharge from rehab facility about a week before Thanksgiving.  She is to be able to walk with a walker but lately she has not been able to ambulate.  He also reported increasing confusion which appears to be intermittent and worse in the evenings.  Objective:    Vitals:   03/15/24 1015 03/15/24 1130 03/15/24 1200 03/15/24 1234  BP:  102/60 (!) 115/46 (!) 113/46  Pulse: 78 73 73 76  Resp: 15 16 16    Temp:    ROLLEN)  97.4 F (36.3 C)  TempSrc:    Oral  SpO2: 100% 100% 100% 98%  Weight:      Height:       Orthostatic VS for the past 24 hrs:  BP- Lying Pulse- Lying BP- Sitting Pulse- Sitting  03/14/24 1756 100/50 66 101/54 70    No intake or output data in the 24 hours ending 03/15/24 1542 Filed Weights   03/14/24 1626  Weight: 73 kg    Exam:  GEN: NAD SKIN: Multiple bruises on upper and lower extremities EYES: No pallor or icterus ENT: MMM CV: RRR PULM: CTA B ABD: soft, ND, NT, +BS CNS: AAO x 1 (person), non focal EXT: Left leg and pedal edema MSK: no spinal tenderness   Wound 03/15/24 1308 Pressure Injury Buttocks Right Stage 2 -  Partial thickness loss of dermis presenting as a shallow open injury with a red, pink wound bed without slough. (Active)     Wound 03/15/24 1309 Pressure Injury Coccyx Right Stage 2 -  Partial thickness loss of dermis presenting as a shallow open injury with a red, pink wound bed without slough. (Active)     Wound 03/15/24 1310 Pressure Injury Buttocks Left Stage 1 -  Intact skin with non-blanchable redness of a localized area usually over a bony prominence. (Active)     Wound 03/15/24 1413 Pressure  Injury Buttocks Right Unstageable - Full thickness tissue loss in which the base of the injury is covered by slough (yellow, tan, gray, green or brown) and/or eschar (tan, brown or black) in the wound bed. (Active)     Data Reviewed:   I have personally reviewed following labs and imaging studies:  Labs: Labs show the following:   Basic Metabolic Panel: Recent Labs  Lab 03/14/24 1621 03/15/24 0823  NA 139 139  K 3.7 3.3*  CL 103 104  CO2 26 27  GLUCOSE 73 97  BUN 9 6*  CREATININE 0.57 0.48  CALCIUM  9.8 9.8   GFR Estimated Creatinine Clearance: 58.1 mL/min (by C-G formula based on SCr of 0.48 mg/dL). Liver Function Tests: Recent Labs  Lab 03/14/24 1621  AST 40  ALT 10  ALKPHOS 119  BILITOT 0.5  PROT 5.0*  ALBUMIN  2.5*   No results for input(s): LIPASE, AMYLASE in the last 168 hours. No results for input(s): AMMONIA in the last 168 hours. Coagulation profile Recent Labs  Lab 03/14/24 1621  INR 1.5*    CBC: Recent Labs  Lab 03/14/24 1621 03/15/24 0823  WBC 11.3* 10.0  HGB 9.8* 8.6*  HCT 30.4* 27.2*  MCV 91.3 92.2  PLT 43* 35*   Cardiac Enzymes: No results for input(s): CKTOTAL, CKMB, CKMBINDEX, TROPONINI in the last 168 hours. BNP (last 3 results) Recent Labs    03/15/24 0823  PROBNP 1,237.0*   CBG: Recent Labs  Lab 03/14/24 1627 03/14/24 1714 03/15/24 0816  GLUCAP 67* 98 93   D-Dimer: No results for input(s): DDIMER in the last 72 hours. Hgb A1c: No results for input(s): HGBA1C in the last 72 hours. Lipid Profile: No results for input(s): CHOL, HDL, LDLCALC, TRIG, CHOLHDL, LDLDIRECT in the last 72 hours. Thyroid  function studies: No results for input(s): TSH, T4TOTAL, T3FREE, THYROIDAB in the last 72 hours.  Invalid input(s): FREET3 Anemia work up: Recent Labs    03/15/24 0823  FERRITIN 358*  TIBC 135*  IRON 43   Sepsis Labs: Recent Labs  Lab 03/14/24 1621 03/14/24 1927 03/15/24 0823   WBC 11.3*  --  10.0  LATICACIDVEN  --  1.8  --     Microbiology Recent Results (from the past 240 hours)  Blood culture (routine x 2)     Status: None (Preliminary result)   Collection Time: 03/14/24  7:14 PM   Specimen: BLOOD  Result Value Ref Range Status   Specimen Description BLOOD LEFT ANTECUBITAL  Final   Special Requests   Final    BOTTLES DRAWN AEROBIC AND ANAEROBIC Blood Culture results may not be optimal due to an inadequate volume of blood received in culture bottles   Culture   Final    NO GROWTH < 12 HOURS Performed at [facility], [address] [place], [place] [address]    Report Status PENDING  Incomplete    Procedures and diagnostic studies:  CT CERVICAL SPINE WO CONTRAST Result Date: 03/15/2024 EXAM: CT CERVICAL SPINE WITHOUT CONTRAST 03/15/2024 02:22:42 AM TECHNIQUE: CT of the cervical spine was performed without the administration of intravenous contrast. Multiplanar reformatted images are provided for review. Automated exposure control, iterative reconstruction, and/or weight based adjustment of the mA/kV was utilized to reduce the radiation dose to as low as reasonably achievable. COMPARISON: None available. CLINICAL HISTORY: neck pain FINDINGS: BONES AND ALIGNMENT: No acute fracture or traumatic malalignment. DEGENERATIVE CHANGES: Mild degenerative disc disease without spinal canal stenosis. Multilevel facet arthrosis. No high-grade foraminal stenosis. SOFT TISSUES: No prevertebral soft tissue swelling. IMPRESSION: 1. Mild degenerative disc disease without spinal canal stenosis. 2. Multilevel facet arthrosis without high-grade foraminal stenosis. Electronically signed by: [name] Stanford MD 03/15/2024 02:31 AM EST RP Workstation: [id number]   CT Angio Chest/Abd/Pel for Dissection W and/or Wo Contrast Result Date: 03/14/2024 EXAM: CTA CHEST, ABDOMEN AND PELVIS WITH AND WITHOUT CONTRAST 03/14/2024 08:35:25 PM TECHNIQUE: CTA of the chest was performed with and  without the administration of intravenous contrast. CTA of the abdomen and pelvis was performed with and without the administration of intravenous contrast. Multiplanar reformatted images are provided for review. MIP images are provided for review. Automated exposure control, iterative reconstruction, and/or weight based adjustment of the mA/kV was utilized to reduce the radiation dose to as low as reasonably achievable. COMPARISON: 12/15/2023. CLINICAL HISTORY: weakness, back pain FINDINGS: VASCULATURE: Coronary artery and aortic atherosclerosis are present. AORTA: Aortic atherosclerosis is present. No acute finding. No abdominal aortic aneurysm. No dissection. PULMONARY ARTERIES: No pulmonary embolism with the limits of this exam. GREAT VESSELS OF AORTIC ARCH: No acute finding. No dissection. No arterial occlusion or significant stenosis. CELIAC TRUNK: No acute finding. No occlusion or significant stenosis. SUPERIOR MESENTERIC ARTERY: No acute finding. No occlusion or significant stenosis. INFERIOR MESENTERIC ARTERY: No acute finding. No occlusion or significant stenosis. RENAL ARTERIES: No acute finding. No occlusion or significant stenosis. ILIAC ARTERIES: No acute finding. No occlusion or significant stenosis. CHEST: MEDIASTINUM: The heart is borderline in size. Coronary artery atherosclerosis is present. No mediastinal lymphadenopathy. The pericardium demonstrates no acute abnormality. LUNGS AND PLEURA: Dependent opacities in the lower lobes bilaterally, left greater than right. Favor atelectasis although pneumonia cannot be excluded in the left lower lobe. No evidence of pleural effusion or pneumothorax. THORACIC BONES AND SOFT TISSUES: Healed posterior right lower rib fractures. These are unchanged since prior study. Mild depression through the inferior endplate of T6 and superior endplate of T8, new since prior study. ABDOMEN AND PELVIS: LIVER: The liver is unremarkable. GALLBLADDER AND BILE DUCTS:  Gallbladder is unremarkable. No biliary ductal dilatation. SPLEEN: The spleen is unremarkable. PANCREAS: The pancreas is unremarkable. ADRENAL GLANDS: Bilateral  adrenal glands demonstrate no acute abnormality. KIDNEYS, URETERS AND BLADDER: No stones in the kidneys or ureters. No hydronephrosis. No perinephric or periureteral stranding. Urinary bladder is unremarkable. GI AND BOWEL: Stomach and duodenal sweep demonstrate no acute abnormality. There is no bowel obstruction. No abnormal bowel wall thickening or distension. REPRODUCTIVE: Prior hysterectomy. No adnexal mass. PERITONEUM AND RETROPERITONEUM: No ascites or free air. LYMPH NODES: No lymphadenopathy. ABDOMINAL BONES AND SOFT TISSUES: Right old fracture noted at the superior and inferior left pubic rami and left sacrum. Chronic stable mild compression fracture at L5. No acute soft tissue abnormality. IMPRESSION: 1. Coronary artery, aortic atherosclerosis. No evidence of aneurysm or dissection. 2. New vertebral endplate compression fractures at T6 and T8 since prior study. 3. Dependent lower lobe opacities, left greater than right, favored atelectasis, although left lower lobe pneumonia is not excluded. Electronically signed by: [name] Crease MD 03/14/2024 09:29 PM EST RP Workstation: [id number]   DG Chest Portable 1 View Result Date: 03/14/2024 EXAM: 1 VIEW(S) XRAY OF THE CHEST 03/14/2024 07:31:00 PM COMPARISON: Comparison with 12/14/2023. CLINICAL HISTORY: weakness FINDINGS: LUNGS AND PLEURA: Shallow inspiration. Infiltration or atelectasis in the left base is new since the prior study. Probable small left pleural effusion. No pneumothorax. HEART AND MEDIASTINUM: No acute abnormality of the cardiac and mediastinal silhouettes. BONES AND SOFT TISSUES: Postoperative changes in the base of the neck. Degenerative changes in the spine and shoulders. No acute osseous abnormality. IMPRESSION: 1. New left basilar infiltration or atelectasis and probable small left  pleural effusion. Electronically signed by: Elsie Gravely MD 03/14/2024 07:42 PM EST RP Workstation: [id number]               LOS: 1 day   Zaydenn Balaguer  [facility]   Pager on [email or web address]. If 7PM-7AM, please contact night-coverage at [email or web address]     03/15/2024, 3:42 PM           "

## 2024-03-15 NOTE — Progress Notes (Signed)
 Patient DNR bracelet placed to right wrist, gave ensure and patient tolerated well. She is alert and oriented x 3, confusion at times to situation and place. Family at bedside.

## 2024-03-15 NOTE — Evaluation (Addendum)
 Occupational Therapy Evaluation Patient Details Name: Anita Harrell MRN: [id number] DOB: 06/17/1950 Today's Date: 03/15/2024   History of Present Illness   Pt is a 74 y.o. female presented to ER due to weakness, increased confusion, neck and back pain with recent UTI and now T6 and T8 compression fractures. PMH of chronic HFpEF, paroxysmal A-fib, HLD, dementia, depression, and generalized anxiety disorder, chronic neck and back pain, OSA     Clinical Impressions Pt was seen for OT evaluation this date. PTA, pt resides at home with her husband. Husband provides history and he has been assisting with all her care at home, including ADLs and stand pivot transfers from bed to [facility] to wheelchair and lift chair with assist. Pt presents with deficits in pain, strength, safety awareness, and activity tolerance limiting their ability to perform ADL management at baseline level. Pt currently requires Max A +2 for all bed mobility tasks. Pain appears well managed after recent dose of pain meds. She tolerated ~15 mins seated at EOB with Mod A for significant L lateral lean initially, but progressed to CGA while performing face washing. Did not appear safe to stand this date. Will follow acutely to maximize strength and recommend rehabilitation < 3hrs/day upon DC d/t increased levels of care provided that husband is unable to provide with new weakness and high pain levels.     If plan is discharge home, recommend the following:   A lot of help with bathing/dressing/bathroom;A lot of help with walking and/or transfers     Functional Status Assessment   Patient has had a recent decline in their functional status and demonstrates the ability to make significant improvements in function in a reasonable and predictable amount of time.     Equipment Recommendations   Other (comment) (defer)     Recommendations for Other Services         Precautions/Restrictions         Mobility Bed  Mobility Overal bed mobility: Needs Assistance Bed Mobility: Supine to Sit, Sit to Supine     Supine to sit: Max assist, +2 for physical assistance Sit to supine: Max assist, +2 for physical assistance   General bed mobility comments: from ED gurney, anticipate improvement once in hospital bed and able to use rails/bed features; increased time, cues and effort    Transfers                   General transfer comment: deferred based on poor sitting balance/tolerance      Balance Overall balance assessment: Needs assistance Sitting-balance support: Feet supported, Single extremity supported Sitting balance-Leahy Scale: Poor Sitting balance - Comments: Mod A with initial R lateral lean progressing to CGA for seated balance on edge of gurney Postural control: Right lateral lean                                 ADL either performed or assessed with clinical judgement   ADL Overall ADL's : Needs assistance/impaired     Grooming: Wash/dry face;Sitting;Supervision/safety;Contact guard assist Grooming Details (indicate cue type and reason): able to use BUEs to wash her face with cueing for initiation while seated EOB with assist to maintain seated balance                               General ADL Comments: anticipate Max/Total A for LB ADL management  and at least mod A for UB ADL management     Vision         Perception         Praxis         Pertinent Vitals/Pain Pain Assessment Pain Assessment: Faces Faces Pain Scale: Hurts a little bit Pain Location: neck/back Pain Descriptors / Indicators: Discomfort Pain Intervention(s): Monitored during session, Repositioned, Premedicated before session     Extremity/Trunk Assessment Upper Extremity Assessment Upper Extremity Assessment: Generalized weakness   Lower Extremity Assessment Lower Extremity Assessment: Generalized weakness       Communication Communication Communication: No  apparent difficulties   Cognition Arousal: Alert   Cognition: History of cognitive impairments                               Following commands: Impaired Following commands impaired: Follows one step commands inconsistently, Follows one step commands with increased time     Cueing  General Comments   Cueing Techniques: Verbal cues;Gestural cues;Tactile cues  VSS   Exercises Other Exercises Other Exercises: Edu on role of OT in acute setting with husband and DC recommendations.   Shoulder Instructions      Home Living Family/patient expects to be discharged to:: Private residence Living Arrangements: Spouse/significant other Available Help at Discharge: Family;Available 24 hours/day Type of Home: House Home Access: Level entry     Home Layout: Able to live on main level with bedroom/bathroom     Bathroom Shower/Tub: Chief Strategy Officer: Standard     Home Equipment: Agricultural Consultant (2 wheels);Cane - single point;Wheelchair - manual;BSC/3in1;Lift chair   Additional Comments: Patient lives in two level home, master bedroom on main floor but kitchen and living room on second floor with flight of steps. Reports she doesn't need to go upstairs as her husband does the cooking.      Prior Functioning/Environment Prior Level of Function : Independent/Modified Independent;History of Falls (last six months)             Mobility Comments: per husband he has been assisting pt with stand pivot transfers from bed>BSC>W/C>lift chair ADLs Comments: husband assists with all ADLs, sometimes helps feed her d/t her dementia    OT Problem List: Decreased strength;Pain;Decreased activity tolerance;Impaired balance (sitting and/or standing);Decreased safety awareness   OT Treatment/Interventions: Self-care/ADL training;Therapeutic exercise;Patient/family education;Balance training;Energy conservation;DME and/or AE instruction;Therapeutic activities       OT Goals(Current goals can be found in the care plan section)   Acute Rehab OT Goals Patient Stated Goal: go to rehab OT Goal Formulation: With patient/family Time For Goal Achievement: 03/29/24 Potential to Achieve Goals: Good ADL Goals Pt Will Perform Grooming: with supervision;sitting Pt Will Perform Upper Body Dressing: with contact guard assist;sitting;with min assist Pt Will Transfer to Toilet: with min assist;stand pivot transfer;bedside commode   OT Frequency:  Min 2X/week    Co-evaluation PT/OT/SLP Co-Evaluation/Treatment: Yes Reason for Co-Treatment: For patient/therapist safety;Necessary to address cognition/behavior during functional activity;To address functional/ADL transfers PT goals addressed during session: Mobility/safety with mobility;Balance OT goals addressed during session: ADL's and self-care      AM-PAC OT 6 Clicks Daily Activity     Outcome Measure Help from another person eating meals?: A Lot Help from another person taking care of personal grooming?: A Lot Help from another person toileting, which includes using toliet, bedpan, or urinal?: Total Help from another person bathing (including washing, rinsing, drying)?: Total Help from another  person to put on and taking off regular upper body clothing?: A Lot Help from another person to put on and taking off regular lower body clothing?: Total 6 Click Score: 9   End of Session Nurse Communication: Mobility status  Activity Tolerance: Patient tolerated treatment well Patient left: in bed;with call bell/phone within reach;with family/visitor present  OT Visit Diagnosis: Other abnormalities of gait and mobility (R26.89);Muscle weakness (generalized) (M62.81);Pain                Time: 1101-1120 OT Time Calculation (min): 19 min Charges:  OT General Charges $OT Visit: 1 Visit OT Evaluation $OT Eval Moderate Complexity: 1 Mod  Daesean Lazarz Chrismon, OTR/L 03/15/2024, 1:21 PM  Claudie Rathbone E  Chrismon 03/15/2024, 1:21 PM

## 2024-03-15 NOTE — Evaluation (Signed)
 Physical Therapy Evaluation Patient Details Name: Anita Harrell MRN: [id number] DOB: 07-15-1950 Today's Date: 03/15/2024  History of Present Illness  Patient is a 74 year old female with weakness, confusion, neck and back pain. Found to have UTI, T6 and T8 compression fracture. PMH: HFpEF, paroxysmal A-fib, HLD, dementia, depression, and generalized anxiety disorder, chronic neck and back pain, OSA.  Clinical Impression  Patient is agreeable to PT evaluation. She is mildly confused but cooperative during session. Supportive spouse at the bedside. Spouse reports patient has been requiring increased support recently at home. He assists with pivot transfers to wheelchair, bed, lift chair, bed side commode. She needs intermittent assistance for ADLs.  Today the patient required +2 person assistance for bed mobility. Sitting balance is poor with left lateral lean. She is fatigued with minimal activity. Recommend to continue PT to maximize independence and facilitate return to prior level of function. Rehabilitation < 3 hours/day recommended after this hospital stay.       If plan is discharge home, recommend the following: A lot of help with walking and/or transfers;A lot of help with bathing/dressing/bathroom;Assist for transportation;Help with stairs or ramp for entrance;Assistance with cooking/housework;Assistance with feeding;Direct supervision/assist for medications management;Direct supervision/assist for financial management   Can travel by private vehicle   No    Equipment Recommendations None recommended by PT  Recommendations for Other Services       Functional Status Assessment Patient has had a recent decline in their functional status and demonstrates the ability to make significant improvements in function in a reasonable and predictable amount of time.     Precautions / Restrictions Precautions Precautions: Fall Recall of Precautions/Restrictions:  Impaired Precaution/Restrictions Comments: chronic back pain with prior fractures Restrictions Weight Bearing Restrictions Per Provider Order: No      Mobility  Bed Mobility Overal bed mobility: Needs Assistance Bed Mobility: Supine to Sit, Sit to Supine     Supine to sit: Max assist, +2 for physical assistance Sit to supine: Max assist, +2 for physical assistance   General bed mobility comments: cues for sequencing. increased time and effort required    Transfers                   General transfer comment: not attempted due to poor sitting balance, fatigue with minimal activity    Ambulation/Gait               General Gait Details: not attempted  Stairs            Wheelchair Mobility     Tilt Bed    Modified Rankin (Stroke Patients Only)       Balance Overall balance assessment: Needs assistance Sitting-balance support: Feet supported, Single extremity supported Sitting balance-Leahy Scale: Poor Sitting balance - Comments: Mod A with left lateral lean. cues for midline. spouse reports patient often leans to right or left with fatigue when sitting in her recliner chair at home Postural control: Left lateral lean                                   Pertinent Vitals/Pain Pain Assessment Pain Assessment: Faces Faces Pain Scale: Hurts a little bit Pain Location: neck/back Pain Descriptors / Indicators: Discomfort Pain Intervention(s): Monitored during session    Home Living Family/patient expects to be discharged to:: Private residence Living Arrangements: Spouse/significant other Available Help at Discharge: Family;Available 24 hours/day Type of Home: House Home Access:  Level entry       Home Layout: Able to live on main level with bedroom/bathroom Home Equipment: Rolling Walker (2 wheels);Cane - single point;Wheelchair - manual;BSC/3in1;Lift chair Additional Comments: Patient lives in two level home, master bedroom on  main floor but kitchen and living room on second floor with flight of steps. Reports she doesn't need to go upstairs as her husband does the cooking.    Prior Function Prior Level of Function : Needs assist             Mobility Comments: per husband he has been assisting pt with stand pivot transfers from bed>BSC>W/C>lift chair ADLs Comments: husband assists with all ADLs, sometimes helps feed her d/t her dementia     Extremity/Trunk Assessment   Upper Extremity Assessment Upper Extremity Assessment: Generalized weakness    Lower Extremity Assessment Lower Extremity Assessment: Generalized weakness       Communication   Communication Communication: No apparent difficulties    Cognition Arousal: Alert Behavior During Therapy: WFL for tasks assessed/performed   PT - Cognitive impairments: History of cognitive impairments, Initiation, Sequencing, Problem solving                         Following commands: Impaired Following commands impaired: Follows one step commands inconsistently, Follows one step commands with increased time     Cueing Cueing Techniques: Verbal cues, Gestural cues, Tactile cues     General Comments General comments (skin integrity, edema, etc.): vitals stable during session    Exercises     Assessment/Plan    PT Assessment Patient needs continued PT services  PT Problem List Decreased strength;Decreased range of motion;Decreased activity tolerance;Decreased balance;Decreased mobility;Decreased cognition;Decreased safety awareness;Decreased knowledge of precautions       PT Treatment Interventions DME instruction;Gait training;Functional mobility training;Therapeutic activities;Therapeutic exercise;Balance training;Neuromuscular re-education;Cognitive remediation;Patient/family education;Wheelchair mobility training    PT Goals (Current goals can be found in the Care Plan section)  Acute Rehab PT Goals Patient Stated Goal: to get  therapy PT Goal Formulation: With patient/family Time For Goal Achievement: 03/29/24 Potential to Achieve Goals: Fair    Frequency Min 2X/week     Co-evaluation PT/OT/SLP Co-Evaluation/Treatment: Yes Reason for Co-Treatment: For patient/therapist safety;Necessary to address cognition/behavior during functional activity;To address functional/ADL transfers PT goals addressed during session: Mobility/safety with mobility;Balance OT goals addressed during session: ADL's and self-care       AM-PAC PT 6 Clicks Mobility  Outcome Measure Help needed turning from your back to your side while in a flat bed without using bedrails?: A Lot Help needed moving from lying on your back to sitting on the side of a flat bed without using bedrails?: Total Help needed moving to and from a bed to a chair (including a wheelchair)?: Total Help needed standing up from a chair using your arms (e.g., wheelchair or bedside chair)?: Total Help needed to walk in hospital room?: Total Help needed climbing 3-5 steps with a railing? : Total 6 Click Score: 7    End of Session Equipment Utilized During Treatment: Oxygen  Activity Tolerance: Patient limited by fatigue Patient left: in bed;with call bell/phone within reach;with bed alarm set;with family/visitor present   PT Visit Diagnosis: Muscle weakness (generalized) (M62.81);Unsteadiness on feet (R26.81)    Time: 1101-1120 PT Time Calculation (min) (ACUTE ONLY): 19 min   Charges:   PT Evaluation $PT Eval Moderate Complexity: 1 Mod   PT General Charges $$ ACUTE PT VISIT: 1 Visit  Randine Essex, PT, MPT   Randine LULLA Essex 03/15/2024, 1:33 PM

## 2024-03-15 NOTE — Progress Notes (Signed)
 PHARMACY - PHYSICIAN COMMUNICATION CRITICAL VALUE ALERT - BLOOD CULTURE IDENTIFICATION (BCID)  Anita Harrell is an 74 y.o. female who presented to [facility] on 03/14/2024 with a chief complaint of UTI   Assessment:  Staph epi in 1 of 2 bottles (aerobic) , Mec A detected.  Probably contaminant.  Pt grew out E Coli in urine cx week of 02/21/2024, treated with cepahlexin.  (include suspected source if known)  Name of physician (or Provider) Contacted: [place]  Current antibiotics: Ceftriaxone  1 gm IV Q24H   Changes to prescribed antibiotics recommended:  Patient is on recommended antibiotics - No changes needed  Results for orders placed or performed during the hospital encounter of 03/14/24  Blood Culture ID Panel (Reflexed) (Collected: 03/14/2024  7:14 PM)  Result Value Ref Range   Enterococcus faecalis NOT DETECTED NOT DETECTED   Enterococcus Faecium NOT DETECTED NOT DETECTED   Listeria monocytogenes NOT DETECTED NOT DETECTED   Staphylococcus species DETECTED (A) NOT DETECTED   Staphylococcus aureus (BCID) NOT DETECTED NOT DETECTED   Staphylococcus epidermidis DETECTED (A) NOT DETECTED   Staphylococcus lugdunensis NOT DETECTED NOT DETECTED   Streptococcus species NOT DETECTED NOT DETECTED   Streptococcus agalactiae NOT DETECTED NOT DETECTED   Streptococcus pneumoniae NOT DETECTED NOT DETECTED   Streptococcus pyogenes NOT DETECTED NOT DETECTED   A.calcoaceticus-baumannii NOT DETECTED NOT DETECTED   Bacteroides fragilis NOT DETECTED NOT DETECTED   Enterobacterales NOT DETECTED NOT DETECTED   Enterobacter cloacae complex NOT DETECTED NOT DETECTED   Escherichia coli NOT DETECTED NOT DETECTED   Klebsiella aerogenes NOT DETECTED NOT DETECTED   Klebsiella oxytoca NOT DETECTED NOT DETECTED   Klebsiella pneumoniae NOT DETECTED NOT DETECTED   Proteus species NOT DETECTED NOT DETECTED   Salmonella species NOT DETECTED NOT DETECTED   Serratia marcescens NOT DETECTED NOT DETECTED    Haemophilus influenzae NOT DETECTED NOT DETECTED   Neisseria meningitidis NOT DETECTED NOT DETECTED   Pseudomonas aeruginosa NOT DETECTED NOT DETECTED   Stenotrophomonas maltophilia NOT DETECTED NOT DETECTED   Candida albicans NOT DETECTED NOT DETECTED   Candida auris NOT DETECTED NOT DETECTED   Candida glabrata NOT DETECTED NOT DETECTED   Candida krusei NOT DETECTED NOT DETECTED   Candida parapsilosis NOT DETECTED NOT DETECTED   Candida tropicalis NOT DETECTED NOT DETECTED   Cryptococcus neoformans/gattii NOT DETECTED NOT DETECTED   Methicillin resistance mecA/C DETECTED (A) NOT DETECTED    Sunaina Ferrando D 03/15/2024  10:06 PM

## 2024-03-15 NOTE — ED Notes (Signed)
 Answered call light, pt was soiled with urine, changed sheets and pt, placed a brief and pure wick on the pt.

## 2024-03-16 DIAGNOSIS — S22000A Wedge compression fracture of unspecified thoracic vertebra, initial encounter for closed fracture: Secondary | ICD-10-CM | POA: Diagnosis not present

## 2024-03-16 DIAGNOSIS — L8990 Pressure ulcer of unspecified site, unspecified stage: Secondary | ICD-10-CM | POA: Insufficient documentation

## 2024-03-16 DIAGNOSIS — D61818 Other pancytopenia: Secondary | ICD-10-CM

## 2024-03-16 DIAGNOSIS — N3000 Acute cystitis without hematuria: Secondary | ICD-10-CM | POA: Diagnosis not present

## 2024-03-16 LAB — GLUCOSE, CAPILLARY
Glucose-Capillary: 101 mg/dL — ABNORMAL HIGH (ref 70–99)
Glucose-Capillary: 110 mg/dL — ABNORMAL HIGH (ref 70–99)
Glucose-Capillary: 122 mg/dL — ABNORMAL HIGH (ref 70–99)
Glucose-Capillary: 90 mg/dL (ref 70–99)

## 2024-03-16 LAB — CBC
HCT: 28.1 % — ABNORMAL LOW (ref 36.0–46.0)
Hemoglobin: 9.1 g/dL — ABNORMAL LOW (ref 12.0–15.0)
MCH: 29.4 pg (ref 26.0–34.0)
MCHC: 32.4 g/dL (ref 30.0–36.0)
MCV: 90.9 fL (ref 80.0–100.0)
Platelets: 32 K/uL — ABNORMAL LOW (ref 150–400)
RBC: 3.09 MIL/uL — ABNORMAL LOW (ref 3.87–5.11)
RDW: 16.6 % — ABNORMAL HIGH (ref 11.5–15.5)
WBC: 9.6 K/uL (ref 4.0–10.5)
nRBC: 0.5 % — ABNORMAL HIGH (ref 0.0–0.2)

## 2024-03-16 LAB — BASIC METABOLIC PANEL WITH GFR
Anion gap: 10 (ref 5–15)
BUN: 5 mg/dL — ABNORMAL LOW (ref 8–23)
CO2: 25 mmol/L (ref 22–32)
Calcium: 10.1 mg/dL (ref 8.9–10.3)
Chloride: 104 mmol/L (ref 98–111)
Creatinine, Ser: 0.46 mg/dL (ref 0.44–1.00)
GFR, Estimated: >60 mL/min
Glucose, Bld: 109 mg/dL — ABNORMAL HIGH (ref 70–99)
Potassium: 3.6 mmol/L (ref 3.5–5.1)
Sodium: 140 mmol/L (ref 135–145)

## 2024-03-16 LAB — RETICULOCYTES
Immature Retic Fract: 30.9 % — ABNORMAL HIGH (ref 2.3–15.9)
RBC.: 3.05 MIL/uL — ABNORMAL LOW (ref 3.87–5.11)
Retic Count, Absolute: 69.5 K/uL (ref 19.0–186.0)
Retic Ct Pct: 2.3 % (ref 0.4–3.1)

## 2024-03-16 LAB — MAGNESIUM: Magnesium: 1.8 mg/dL (ref 1.7–2.4)

## 2024-03-16 NOTE — Consult Note (Signed)
 "  Hematology/Oncology Consult note [facility] [phone] [phone]  Patient Care Team: Tower, [name], MD as PCP - General (Family Medicine) Perla, Evalene PARAS, MD as PCP - Cardiology (Cardiology) Rox Charleston, MD as Consulting Physician (Obstetrics and Gynecology) Tamea Dedra CROME, MD as Consulting Physician (Pulmonary Disease) Doles-Johnson, Teah, NP as Nurse Practitioner (Psychiatry) Fate Morna SAILOR, [facility] (Inactive) as Pharmacist (Pharmacist) Babara Call, MD as Consulting Physician (Oncology) Unk Corinn Skiff, MD as Consulting Physician (Gastroenterology)   Name of the patient: Anita Harrell  [id number]  June 13, 1950    Reason for referral-anemia and thrombocytopenia   Referring physician- Dr. Jens  Date of visit: 03/16/2024    History of presenting illness- Patient is a 74 year old female with a past medical history significant for hypertension depression dementia anxiety disorder chronic history of heart failure obstructive sleep apnea who presented to the hospital with symptoms of weakness back pain and increasing confusion.  It had become increasingly difficult for patient's family to care for her.  She has been recently treated with antibiotic courses x 2 for her UTI in November and December 2025.  Hematology consulted for anemia and thrombocytopenia.  Patient's hemoglobin 3 months ago was around 11.4.  During present hospitalization it has gone down to 8.6.  Platelets 3 months ago over 64 and now drifted down to 35.  White cell count is normal.  Differential showed mild increase in neutrophils as well as immature granulocytes.  Patient has been seen by Dr. Babara recently for workup of leukocytosis and thrombocytopenia.  Patient had B12 folate HIV and hepatitis workup done Last month which was unremarkable.  Peripheral flow cytometry did not show any immunophenotypic abnormality.  Both kappa and lambda light chains were mildly elevated  with a normal free light chain ratio.  Myeloma panel showed 0.3 g of IgM M protein.  Immature platelet fraction was normal.  Smear review was unremarkable.   Review of systems- Review of Systems  Constitutional:  Positive for malaise/fatigue and weight loss. Negative for chills and fever.  HENT:  Negative for congestion, ear discharge and nosebleeds.   Eyes:  Negative for blurred vision.  Respiratory:  Negative for cough, hemoptysis, sputum production, shortness of breath and wheezing.   Cardiovascular:  Negative for chest pain, palpitations, orthopnea and claudication.  Gastrointestinal:  Negative for abdominal pain, blood in stool, constipation, diarrhea, heartburn, melena, nausea and vomiting.  Genitourinary:  Negative for dysuria, flank pain, frequency, hematuria and urgency.  Musculoskeletal:  Negative for back pain, joint pain and myalgias.  Skin:  Negative for rash.  Neurological:  Negative for dizziness, tingling, focal weakness, seizures, weakness and headaches.  Endo/Heme/Allergies:  Does not bruise/bleed easily.  Psychiatric/Behavioral:  Positive for memory loss. Negative for depression and suicidal ideas. The patient does not have insomnia.     Allergies[1]  Patient Active Problem List   Diagnosis Date Noted   Toe injury, left, initial encounter 02/21/2024   Cognitive impairment 02/21/2024   Tremor 02/21/2024   Easy bruising 02/19/2024   Skin lesion 02/19/2024   Dysuria 01/29/2024   Moderately severe major depression (HCC) 01/29/2024   Multiple closed pelvic fractures with disruption of pelvic circle, initial encounter (HCC) 12/16/2023   Multiple closed pelvic fractures without disruption of pelvic circle (HCC) 12/15/2023   Closed fracture of head of left radius 12/15/2023   Closed fracture of left olecranon process 12/15/2023   Lumbar compression fracture, closed, initial encounter (HCC) 12/15/2023   OSA (obstructive sleep apnea) 07/31/2023  Osteopenia 07/31/2023    COPD (chronic obstructive pulmonary disease) (HCC) 04/03/2023   Hypophosphatemia 11/19/2022   Metabolic acidosis 11/19/2022   Chronic heart failure with preserved ejection fraction (HFpEF) (HCC) 11/18/2022   Paroxysmal atrial fibrillation (HCC) 11/18/2022   Thrombocytopenia 11/18/2022   Polypharmacy 05/17/2022   General weakness 02/16/2022   Poor balance 02/16/2022   Falling 02/16/2022   Hypokalemia 02/13/2022   HTN (hypertension) 02/13/2022   DDD (degenerative disc disease), lumbar 12/26/2021   Atrial fibrillation with RVR (HCC) 10/05/2021   Estrogen deficiency 06/07/2021   Overactive bladder 10/17/2019   Medicare annual wellness visit, subsequent 01/16/2019   Thyroid  nodule 05/01/2018   Electronic cigarette use 04/03/2018   Obesity (BMI 30-39.9) 04/03/2018   Lung mass 02/01/2018   Smokers' cough (HCC) 01/01/2018   Microscopic hematuria 08/13/2012   UTI (urinary tract infection) 07/22/2012   Routine general medical examination at a health care facility 07/22/2012   Leukocytosis 03/30/2010   Prediabetes 02/21/2008   Allergic rhinitis 07/25/2007   Low back pain 06/26/2007   Personal history of goiter 07/27/2006   Hyperlipidemia 07/27/2006   PANIC DISORDER 07/27/2006   Former smoker 07/27/2006   Depression with anxiety 07/27/2006   Insomnia 07/27/2006   History of colonic polyps 06/09/2004     Past Medical History:  Diagnosis Date   Allergy    allergic rhinitis   Anxiety    Arrhythmia    atrial fibrillation   Arthritis    Back pain    Chest pain    CHF (congestive heart failure) (HCC)    COPD (chronic obstructive pulmonary disease) (HCC)    Depression    Emphysema of lung (HCC)    Epigastric pain    GERD (gastroesophageal reflux disease)    Goiter    History of tobacco abuse    Hyperglycemia    mild   Hyperlipidemia    Hypertension    Insomnia    Hx of   Labile blood pressure    Panic disorder    History of   Personal history of colonic polyps  06/09/2004   SVD (spontaneous vaginal delivery)    x 2     Past Surgical History:  Procedure Laterality Date   ABDOMINAL HYSTERECTOMY N/A 09/24/2012   Procedure: HYSTERECTOMY ABDOMINAL;  Surgeon: Peggye Gull, MD;  Location: [facility];  Service: Gynecology;  Laterality: N/A;   BREAST BIOPSY Right 2016   CARDIOVERSION N/A 02/09/2022   Procedure: CARDIOVERSION;  Surgeon: Perla Evalene PARAS, MD;  Location: [facility];  Service: Cardiovascular;  Laterality: N/A;   CARDIOVERSION N/A 03/02/2022   Procedure: CARDIOVERSION;  Surgeon: Perla Evalene PARAS, MD;  Location: [facility];  Service: Cardiovascular;  Laterality: N/A;   COLONOSCOPY     COLONOSCOPY  2017   ELBOW SURGERY  2004   EYE SURGERY     bilateral lasik   SALPINGOOPHORECTOMY Bilateral 09/24/2012   Procedure: SALPINGO OOPHORECTOMY;  Surgeon: Peggye Gull, MD;  Location: [facility];  Service: Gynecology;  Laterality: Bilateral;   THYROID  SURGERY     B9 massess   WART FULGURATION N/A 09/24/2012   Procedure: FULGURATION VAGINAL WART;  Surgeon: Peggye Gull, MD;  Location: [facility];  Service: Gynecology;  Laterality: N/A;   WISDOM TOOTH EXTRACTION      Social History   Socioeconomic History   Marital status: Married    Spouse name: Not on file   Number of children: Not on file   Years of education: Not on file   Highest education level: Not on  file  Occupational History   Not on file  Tobacco Use   Smoking status: Former    Current packs/day: 0.00    Average packs/day: 1.0 packs/day    Types: E-cigarettes, Cigarettes    Quit date: 2011    Years since quitting: 15.0   Smokeless tobacco: Never  Vaping Use   Vaping status: Former   Quit date: 11/18/2022  Substance and Sexual Activity   Alcohol use: No    Alcohol/week: 0.0 standard drinks of alcohol   Drug use: No   Sexual activity: Yes    Birth control/protection: Post-menopausal  Other Topics Concern   Not on file  Social History Narrative   Lives with husband and 2 pets; dogs.    Social Drivers of Health   Tobacco Use: Medium Risk (03/14/2024)   Patient History    Smoking Tobacco Use: Former    Smokeless Tobacco Use: Never    Passive Exposure: Not on file  Financial Resource Strain: Low Risk  (09/19/2023)   Received from [facility]   Overall Financial Resource Strain (CARDIA)    Difficulty of Paying Living Expenses: Not hard at all  Food Insecurity: No Food Insecurity (03/15/2024)   Epic    Worried About Radiation Protection Practitioner of Food in the Last Year: Never true    Ran Out of Food in the Last Year: Never true  Transportation Needs: No Transportation Needs (03/15/2024)   Epic    Lack of Transportation (Medical): No    Lack of Transportation (Non-Medical): No  Physical Activity: Inactive (07/27/2023)   Exercise Vital Sign    Days of Exercise per Week: 0 days    Minutes of Exercise per Session: 0 min  Stress: No Stress Concern Present (07/27/2023)   [employer] of Occupational Health - Occupational Stress Questionnaire    Feeling of Stress : Only a little  Social Connections: Socially Integrated (03/15/2024)   Social Connection and Isolation Panel    Frequency of Communication with Friends and Family: Twice a week    Frequency of Social Gatherings with Friends and Family: Twice a week    Attends Religious Services: More than 4 times per year    Active Member of [employer] or Organizations: Yes    Attends Banker Meetings: 1 to 4 times per year    Marital Status: Married  Recent Concern: Social Connections - Moderately Isolated (12/16/2023)   Social Connection and Isolation Panel    Frequency of Communication with Friends and Family: Three times a week    Frequency of Social Gatherings with Friends and Family: Once a week    Attends Religious Services: Never    Database Administrator or Organizations: No    Attends Banker Meetings: Never    Marital Status: Married  Catering Manager Violence: Not At Risk (03/15/2024)   Epic     Fear of Current or Ex-Partner: No    Emotionally Abused: No    Physically Abused: No    Sexually Abused: No  Depression (PHQ2-9): High Risk (02/21/2024)   Depression (PHQ2-9)    PHQ-2 Score: 22  Alcohol Screen: Low Risk (07/27/2023)   Alcohol Screen    Last Alcohol Screening Score (AUDIT): 0  Housing: Low Risk (03/15/2024)   Epic    Unable to Pay for Housing in the Last Year: No    Number of Times Moved in the Last Year: 0    Homeless in the Last Year: No  Utilities: Not At Risk (  03/15/2024)   Epic    Threatened with loss of utilities: No  Health Literacy: Adequate Health Literacy (07/27/2023)   B1300 Health Literacy    Frequency of need for help with medical instructions: Never     Family History  Problem Relation Age of Onset   Hypertension Mother    Stroke Mother    Alcohol abuse Father    Cancer Father        lung CA   Heart disease Father        CHF   Cancer Sister        breast CA   Heart disease Sister        CHF from chemotx also has defib   Breast cancer Sister    Colon cancer Neg Hx    Esophageal cancer Neg Hx    Rectal cancer Neg Hx    Stomach cancer Neg Hx     Current Medications[2]   Physical exam:  Vitals:   03/15/24 1637 03/15/24 2033 03/16/24 0005 03/16/24 0521  BP: (!) 109/52 (!) 120/58 (!) 127/57 (!) 109/53  Pulse: 84 84 81 92  Resp:  17 18 17   Temp: 98.4 F (36.9 C) 98.6 F (37 C) 97.8 F (36.6 C) 98.6 F (37 C)  TempSrc: Axillary  Oral Oral  SpO2: 97% 99% 97% 98%  Weight:      Height:       Physical Exam Constitutional:      Comments: Appears frail and fatigued. Oriented to self only  Cardiovascular:     Rate and Rhythm: Normal rate and regular rhythm.     Heart sounds: Normal heart sounds.  Pulmonary:     Effort: Pulmonary effort is normal.     Breath sounds: Normal breath sounds.  Abdominal:     General: Bowel sounds are normal.     Palpations: Abdomen is soft.  Musculoskeletal:     Cervical back: Normal range of motion.   Skin:    General: Skin is warm and dry.  Neurological:     Mental Status: She is alert and oriented to person, place, and time.           Latest Ref Rng & Units 03/16/2024    4:25 AM  CMP  Glucose 70 - 99 mg/dL 890   BUN 8 - 23 mg/dL 5   Creatinine 9.55 - 8.99 mg/dL 9.53   Sodium 864 - 854 mmol/L 140   Potassium 3.5 - 5.1 mmol/L 3.6   Chloride 98 - 111 mmol/L 104   CO2 22 - 32 mmol/L 25   Calcium  8.9 - 10.3 mg/dL 89.8       Latest Ref Rng & Units 03/16/2024    4:25 AM  CBC  WBC 4.0 - 10.5 K/uL 9.6   Hemoglobin 12.0 - 15.0 g/dL 9.1   Hematocrit 63.9 - 46.0 % 28.1   Platelets 150 - 400 K/uL 32     @IMAGES @  CT CERVICAL SPINE WO CONTRAST Result Date: 03/15/2024 EXAM: CT CERVICAL SPINE WITHOUT CONTRAST 03/15/2024 02:22:42 AM TECHNIQUE: CT of the cervical spine was performed without the administration of intravenous contrast. Multiplanar reformatted images are provided for review. Automated exposure control, iterative reconstruction, and/or weight based adjustment of the mA/kV was utilized to reduce the radiation dose to as low as reasonably achievable. COMPARISON: None available. CLINICAL HISTORY: neck pain FINDINGS: BONES AND ALIGNMENT: No acute fracture or traumatic malalignment. DEGENERATIVE CHANGES: Mild degenerative disc disease without spinal canal stenosis. Multilevel facet arthrosis. No  high-grade foraminal stenosis. SOFT TISSUES: No prevertebral soft tissue swelling. IMPRESSION: 1. Mild degenerative disc disease without spinal canal stenosis. 2. Multilevel facet arthrosis without high-grade foraminal stenosis. Electronically signed by: [name] Stanford MD 03/15/2024 02:31 AM EST RP Workstation: [id number]   CT Angio Chest/Abd/Pel for Dissection W and/or Wo Contrast Result Date: 03/14/2024 EXAM: CTA CHEST, ABDOMEN AND PELVIS WITH AND WITHOUT CONTRAST 03/14/2024 08:35:25 PM TECHNIQUE: CTA of the chest was performed with and without the administration of intravenous contrast. CTA of  the abdomen and pelvis was performed with and without the administration of intravenous contrast. Multiplanar reformatted images are provided for review. MIP images are provided for review. Automated exposure control, iterative reconstruction, and/or weight based adjustment of the mA/kV was utilized to reduce the radiation dose to as low as reasonably achievable. COMPARISON: 12/15/2023. CLINICAL HISTORY: weakness, back pain FINDINGS: VASCULATURE: Coronary artery and aortic atherosclerosis are present. AORTA: Aortic atherosclerosis is present. No acute finding. No abdominal aortic aneurysm. No dissection. PULMONARY ARTERIES: No pulmonary embolism with the limits of this exam. GREAT VESSELS OF AORTIC ARCH: No acute finding. No dissection. No arterial occlusion or significant stenosis. CELIAC TRUNK: No acute finding. No occlusion or significant stenosis. SUPERIOR MESENTERIC ARTERY: No acute finding. No occlusion or significant stenosis. INFERIOR MESENTERIC ARTERY: No acute finding. No occlusion or significant stenosis. RENAL ARTERIES: No acute finding. No occlusion or significant stenosis. ILIAC ARTERIES: No acute finding. No occlusion or significant stenosis. CHEST: MEDIASTINUM: The heart is borderline in size. Coronary artery atherosclerosis is present. No mediastinal lymphadenopathy. The pericardium demonstrates no acute abnormality. LUNGS AND PLEURA: Dependent opacities in the lower lobes bilaterally, left greater than right. Favor atelectasis although pneumonia cannot be excluded in the left lower lobe. No evidence of pleural effusion or pneumothorax. THORACIC BONES AND SOFT TISSUES: Healed posterior right lower rib fractures. These are unchanged since prior study. Mild depression through the inferior endplate of T6 and superior endplate of T8, new since prior study. ABDOMEN AND PELVIS: LIVER: The liver is unremarkable. GALLBLADDER AND BILE DUCTS: Gallbladder is unremarkable. No biliary ductal dilatation. SPLEEN:  The spleen is unremarkable. PANCREAS: The pancreas is unremarkable. ADRENAL GLANDS: Bilateral adrenal glands demonstrate no acute abnormality. KIDNEYS, URETERS AND BLADDER: No stones in the kidneys or ureters. No hydronephrosis. No perinephric or periureteral stranding. Urinary bladder is unremarkable. GI AND BOWEL: Stomach and duodenal sweep demonstrate no acute abnormality. There is no bowel obstruction. No abnormal bowel wall thickening or distension. REPRODUCTIVE: Prior hysterectomy. No adnexal mass. PERITONEUM AND RETROPERITONEUM: No ascites or free air. LYMPH NODES: No lymphadenopathy. ABDOMINAL BONES AND SOFT TISSUES: Right old fracture noted at the superior and inferior left pubic rami and left sacrum. Chronic stable mild compression fracture at L5. No acute soft tissue abnormality. IMPRESSION: 1. Coronary artery, aortic atherosclerosis. No evidence of aneurysm or dissection. 2. New vertebral endplate compression fractures at T6 and T8 since prior study. 3. Dependent lower lobe opacities, left greater than right, favored atelectasis, although left lower lobe pneumonia is not excluded. Electronically signed by: [name] Crease MD 03/14/2024 09:29 PM EST RP Workstation: [id number]   DG Chest Portable 1 View Result Date: 03/14/2024 EXAM: 1 VIEW(S) XRAY OF THE CHEST 03/14/2024 07:31:00 PM COMPARISON: Comparison with 12/14/2023. CLINICAL HISTORY: weakness FINDINGS: LUNGS AND PLEURA: Shallow inspiration. Infiltration or atelectasis in the left base is new since the prior study. Probable small left pleural effusion. No pneumothorax. HEART AND MEDIASTINUM: No acute abnormality of the cardiac and mediastinal silhouettes. BONES AND SOFT TISSUES: Postoperative changes in the  base of the neck. Degenerative changes in the spine and shoulders. No acute osseous abnormality. IMPRESSION: 1. New left basilar infiltration or atelectasis and probable small left pleural effusion. Electronically signed by: Elsie Gravely MD  03/14/2024 07:42 PM EST RP Workstation: [id number]    Assessment and plan- Patient is a 74 y.o. female admitted for acute encephalopathy and back pain.  Hematology consulted for anemia and thrombocytopenia   Anemia workup so far has been unremarkable.  B12 folate normal iron studies were normal.  Myeloma panel showed a mild increase in IgM with a 0.3 g of IgM lambda M protein but this would not overtly explain her anemia.  Flow cytometry unremarkable.  LDH mildly elevated at 404.  Haptoglobin is pending and reticulocyte count is low indicative of hypoproliferative anemia.  Given the concomitant thrombocytopenia I think it would be reasonable to offer a bone marrow biopsy for her.  Workup for thrombocytopenia has also been unremarkable including hepatitis and HIV testing.  Immature platelet fraction was not elevated and therefore a peripheral destructive process is not apparent at this time.  Dr. Babara has seen this patient recently and I will defer any decisions for bone marrow biopsy to her at this time     Visit Diagnosis 1. Weakness   2. Anemia, unspecified type   3. Gastrointestinal hemorrhage, unspecified gastrointestinal hemorrhage type   4. Lower urinary tract infectious disease     Dr. Annah Skene, MD, MPH [facility] [phone] 03/16/2024                   [1]  Allergies Allergen Reactions   Abilify [Aripiprazole] Other (See Comments)    Vision problems   Ambien  [Zolpidem ] Other (See Comments)    Hallucinations MS change    Codeine Nausea And Vomiting    Patient able to tolerate hydrocodone  and oxycodone    Lipitor [Atorvastatin] Other (See Comments)    Myalgias    Vesicare  [Solifenacin ] Other (See Comments)    MS change   Wellbutrin  [Bupropion ] Other (See Comments)    Unknown reaction  [2]  Current Facility-Administered Medications:    acetaminophen  (TYLENOL ) tablet 650 mg, 650 mg, Oral, Q6H PRN, Ponnala, Shruthi, MD   albuterol   (PROVENTIL ) (2.5 MG/3ML) 0.083% nebulizer solution 2.5 mg, 2.5 mg, Inhalation, Q6H PRN, Ponnala, Shruthi, MD   budesonide -glycopyrrolate -formoterol  (BREZTRI ) 160-9-4.8 MCG/ACT inhaler 2 puff, 2 puff, Inhalation, BID, Ponnala, Shruthi, MD, 2 puff at 03/16/24 0849   cefTRIAXone  (ROCEPHIN ) 1 g in sodium chloride  0.9 % 100 mL IVPB, 1 g, Intravenous, Q24H, Ponnala, Shruthi, MD, Last Rate: 200 mL/hr at 03/15/24 1701, 1 g at 03/15/24 1701   donepezil  (ARICEPT ) tablet 5 mg, 5 mg, Oral, Daily, Ponnala, Shruthi, MD, 5 mg at 03/16/24 0849   feeding supplement (ENSURE PLUS HIGH PROTEIN) liquid 237 mL, 237 mL, Oral, BID BM, Jens Durand, MD, 237 mL at 03/16/24 0850   flecainide  (TAMBOCOR ) tablet 100 mg, 100 mg, Oral, BID, Ponnala, Shruthi, MD, 100 mg at 03/16/24 0849   hydrOXYzine  (ATARAX ) tablet 25 mg, 25 mg, Oral, BID, Ponnala, Shruthi, MD, 25 mg at 03/16/24 0849   melatonin tablet 10 mg, 10 mg, Oral, QHS, Ponnala, Shruthi, MD, 10 mg at 03/15/24 0203   oxyCODONE  (Oxy IR/ROXICODONE ) immediate release tablet 2.5 mg, 2.5 mg, Oral, Q4H PRN, Ponnala, Shruthi, MD, 2.5 mg at 03/15/24 1018   [COMPLETED] pantoprazole  (PROTONIX ) injection 40 mg, 40 mg, Intravenous, Q5 min, 40 mg at 03/14/24 1946 **FOLLOWED BY** pantoprazole  (PROTONIX ) injection 40  mg, 40 mg, Intravenous, Q12H, Ponnala, Shruthi, MD, 40 mg at 03/16/24 0848   rosuvastatin  (CRESTOR ) tablet 10 mg, 10 mg, Oral, Daily, Ponnala, Shruthi, MD, 10 mg at 03/16/24 0849   sodium chloride  flush (NS) 0.9 % injection 3 mL, 3 mL, Intravenous, Q12H, Ponnala, Shruthi, MD, 3 mL at 03/16/24 0850  "

## 2024-03-16 NOTE — Progress Notes (Addendum)
 "    Progress Note    Anita Harrell  FMW:[id number] DOB: [birth year]  DOA: 03/14/2024 PCP: Randeen [name], MD      Brief Narrative:    Medical records reviewed and are as summarized below:  Anita Harrell is a 74 y.o. female  with medical history significant of chronic HFpEF, paroxysmal A-fib, HLD, dementia, depression, and generalized anxiety disorder, chronic neck and back pain, OSA, who presented to the ED because of general weakness, increasing confusion, neck and back pain.  He has chronic neck and back pain/lumbar radiculopathy and recently got steroid injection on 03/03/2024.  Since discharge from the rehab facility about a week before Thanksgiving, patient's condition has declined.  She has gotten weaker and weaker and she has been intermittently confused and confusion is typically worse in the evenings.  Husband and family has become increasingly difficult to take care of her at home.  She is to be able to ambulate with a walker.  She is now unable to ambulate.  Recently got 2 rounds of antibiotics for UTI.  She was prescribed 5 days of nitrofurantoin by her neurologist on 01/24/2024.  She was given second round of antibiotics with Keflex  on 02/21/2024.  Urine culture from 02/21/2024 showed pansensitive E. coli.   Vitals in the ED: Temperature 97.4, respiratory rate 12, BP 104/38, oxygen  saturation 99% on 2 L oxygen .   CT angio chest/abdomen and pelvis IMPRESSION: 1. Coronary artery, aortic atherosclerosis. No evidence of aneurysm or dissection. 2. New vertebral endplate compression fractures at T6 and T8 since prior study. 3. Dependent lower lobe opacities, left greater than right, favored atelectasis, although left lower lobe pneumonia is not excluded.    CT cervical spine IMPRESSION: 1. Mild degenerative disc disease without spinal canal stenosis. 2. Multilevel facet arthrosis without high-grade foraminal stenosis.     Assessment/Plan:   Principal Problem:    UTI (urinary tract infection)    Body mass index is 28.52 kg/m.  Acute UTI: Urine culture growing gram-negative rods.  Continue IV ceftriaxone .    Recently treated for UTI on 2 occasions. She was given a prescription for Keflex  for UTI and right breast wound on 02/21/2024.  Urine culture from 02/21/2024 showed pansensitive E. coli. Patient was given a prescription for nitrofurantoin for 5 days on 01/24/2024 when she saw Dr. Maree, neurologist, in the office.  Staph epidermidis bacteremia: 1 out of 2 bottles positive for Staph epidermidis.  This is likely a contaminant.   Dependent lower lobe opacities: Unable to exclude pneumonia.  No respiratory symptoms at this time.  She uses 2 L oxygen  at home as needed for chronic hypoxic respiratory failure.   Hypotension: BP improved with IV fluids.   Positive heme stools, acute anemia: H&H stable.  Hemoglobin trend 9.8-8.6-9.1.  Hemoglobin was 12.9 on 02/19/2024.  Unknown if this is due to GI blood loss.  Continue IV Protonix .  Eliquis  has been held.  Case discussed with Dr. Unk, gastroenterologist on-call, on 03/16/2023. No plan for inpatient endoscopic workup.  Outpatient follow-up recommended. .  She has no plan for endoscopic evaluation at this time given significant thrombocytopenia and lack of overt bleeding and absence of iron deficiency anemia. Patient can follow-up with gastroenterologist as an outpatient. Iron studies inconsistent with iron deficiency anemia: Iron 43, TIBC 135, saturation ratio 32, ferritin 358  Acute on chronic thrombocytopenia: Platelet down from 43 to 35-32. Platelet was 112 on 02/19/2024. Follow-up with hematologist for further recommendations. Of note, patient follows with Dr.  Babara as an outpatient and it appears outpatient workup is in progress   T6 and T8 fracture: Patient said she is not in any pain. Neurosurgery consult is pending. Chronic neck and back pain, chronic lumbar radiculopathy and greater  trochanteric bursitis of left hip: Recent steroid injection for lumbar radiculopathy on 03/03/2024.   General Weakness: PT and OT recommended discharge to SNF   Hypokalemia: Improved   Dementia: Continue supportive care. He sees Dr. Maree, neurologist, as an outpatient.   S/p hypoglycemia: Improved    Chronic HFpEF: Compensated. proBNP 1237.   Paroxysmal atrial fibrillation: Eliquis  has been held because of anemia and thrombocytopenia.  Had discussions with Dasie, son, about her anticoagulation.  Discussed implications of holding anticoagulation for atrial fibrillation and potential complications that may arise with continued use of Eliquis  with anemia and thrombocytopenia. Continue flecainide    Comorbidities include depression, generalized anxiety disorder, solitary right thyroid  nodule   Diet Order             Diet Heart Fluid consistency: Thin  Diet effective now                                  Consultants: Neurosurgeon Gastroenterology  Procedures: None    Medications:    budesonide -glycopyrrolate -formoterol   2 puff Inhalation BID   donepezil   5 mg Oral Daily   feeding supplement  237 mL Oral BID BM   flecainide   100 mg Oral BID   hydrOXYzine   25 mg Oral BID   melatonin  10 mg Oral QHS   pantoprazole  (PROTONIX ) IV  40 mg Intravenous Q12H   rosuvastatin   10 mg Oral Daily   sodium chloride  flush  3 mL Intravenous Q12H   Continuous Infusions:  cefTRIAXone  (ROCEPHIN )  IV 1 g (03/15/24 1701)     Anti-infectives (From admission, onward)    Start     Dose/Rate Route Frequency Ordered Stop   03/15/24 1730  cefTRIAXone  (ROCEPHIN ) 1 g in sodium chloride  0.9 % 100 mL IVPB        1 g 200 mL/hr over 30 Minutes Intravenous Every 24 hours 03/15/24 0147     03/14/24 2300  azithromycin  (ZITHROMAX ) 500 mg in sodium chloride  0.9 % 250 mL IVPB        500 mg 250 mL/hr over 60 Minutes Intravenous  Once 03/14/24 2255 03/15/24 0021   03/14/24 1730   cefTRIAXone  (ROCEPHIN ) 1 g in sodium chloride  0.9 % 100 mL IVPB        1 g 200 mL/hr over 30 Minutes Intravenous  Once 03/14/24 1720 03/14/24 1758              Family Communication/Anticipated D/C date and plan/Code Status   DVT prophylaxis: SCDs Start: 03/15/24 0143     Code Status: Limited: Do not attempt resuscitation (DNR) -DNR-LIMITED -Do Not Intubate/DNI   Family Communication: Plan discussed with Dasie, son, at the bedside Disposition Plan: May need to be discharged to SNF   Status is: Inpatient Remains inpatient appropriate because: General weakness       Subjective:    Interval events noted.  She says she feels fine and wants to go home today.  Dasie, son, at bedside  Objective:    Vitals:   03/15/24 1637 03/15/24 2033 03/16/24 0005 03/16/24 0521  BP: (!) 109/52 (!) 120/58 (!) 127/57 (!) 109/53  Pulse: 84 84 81 92  Resp:  17 18 17   Temp:  98.4 F (36.9 C) 98.6 F (37 C) 97.8 F (36.6 C) 98.6 F (37 C)  TempSrc: Axillary  Oral Oral  SpO2: 97% 99% 97% 98%  Weight:      Height:       No data found.    Intake/Output Summary (Last 24 hours) at 03/16/2024 0818 Last data filed at 03/16/2024 0600 Gross per 24 hour  Intake 340 ml  Output --  Net 340 ml   Filed Weights   03/14/24 1626  Weight: 73 kg    Exam:  GEN: NAD SKIN: Warm and dry EYES: No pallor or icterus ENT: MMM CV: RRR PULM: CTA B ABD: soft, ND, NT, +BS CNS: AAO x 1 (person), non focal EXT: No edema or tenderness.  Left lower extremity swelling has improved    Wound 03/15/24 1308 Pressure Injury Buttocks Right Stage 2 -  Partial thickness loss of dermis presenting as a shallow open injury with a red, pink wound bed without slough. (Active)     Wound 03/15/24 1309 Pressure Injury Coccyx Right Stage 2 -  Partial thickness loss of dermis presenting as a shallow open injury with a red, pink wound bed without slough. (Active)     Wound 03/15/24 1310 Pressure Injury Buttocks  Left Stage 1 -  Intact skin with non-blanchable redness of a localized area usually over a bony prominence. (Active)     Wound 03/15/24 1413 Pressure Injury Buttocks Right Unstageable - Full thickness tissue loss in which the base of the injury is covered by slough (yellow, tan, gray, green or brown) and/or eschar (tan, brown or black) in the wound bed. (Active)     Data Reviewed:   I have personally reviewed following labs and imaging studies:  Labs: Labs show the following:   Basic Metabolic Panel: Recent Labs  Lab 03/14/24 1621 03/15/24 0823 03/16/24 0425  NA 139 139 140  K 3.7 3.3* 3.6  CL 103 104 104  CO2 26 27 25   GLUCOSE 73 97 109*  BUN 9 6* 5*  CREATININE 0.57 0.48 0.46  CALCIUM  9.8 9.8 10.1  MG  --   --  1.8   GFR Estimated Creatinine Clearance: 58.1 mL/min (by C-G formula based on SCr of 0.46 mg/dL). Liver Function Tests: Recent Labs  Lab 03/14/24 1621  AST 40  ALT 10  ALKPHOS 119  BILITOT 0.5  PROT 5.0*  ALBUMIN  2.5*   No results for input(s): LIPASE, AMYLASE in the last 168 hours. No results for input(s): AMMONIA in the last 168 hours. Coagulation profile Recent Labs  Lab 03/14/24 1621  INR 1.5*    CBC: Recent Labs  Lab 03/14/24 1621 03/15/24 0823 03/16/24 0425  WBC 11.3* 10.0 9.6  HGB 9.8* 8.6* 9.1*  HCT 30.4* 27.2* 28.1*  MCV 91.3 92.2 90.9  PLT 43* 35* 32*   Cardiac Enzymes: No results for input(s): CKTOTAL, CKMB, CKMBINDEX, TROPONINI in the last 168 hours. BNP (last 3 results) Recent Labs    03/15/24 0823  PROBNP 1,237.0*   CBG: Recent Labs  Lab 03/14/24 1627 03/14/24 1714 03/15/24 0816 03/15/24 1635  GLUCAP 67* 98 93 110*   D-Dimer: No results for input(s): DDIMER in the last 72 hours. Hgb A1c: No results for input(s): HGBA1C in the last 72 hours. Lipid Profile: No results for input(s): CHOL, HDL, LDLCALC, TRIG, CHOLHDL, LDLDIRECT in the last 72 hours. Thyroid  function studies: No  results for input(s): TSH, T4TOTAL, T3FREE, THYROIDAB in the last 72 hours.  Invalid input(s): FREET3  Anemia work up: Recent Labs    03/15/24 0823 03/16/24 0425  FERRITIN 358*  --   TIBC 135*  --   IRON 43  --   RETICCTPCT  --  2.3   Sepsis Labs: Recent Labs  Lab 03/14/24 1621 03/14/24 1927 03/15/24 0823 03/16/24 0425  WBC 11.3*  --  10.0 9.6  LATICACIDVEN  --  1.8  --   --     Microbiology Recent Results (from the past 240 hours)  Blood culture (routine x 2)     Status: None (Preliminary result)   Collection Time: 03/14/24  7:14 PM   Specimen: BLOOD  Result Value Ref Range Status   Specimen Description BLOOD LEFT ANTECUBITAL  Final   Special Requests   Final    BOTTLES DRAWN AEROBIC AND ANAEROBIC Blood Culture results may not be optimal due to an inadequate volume of blood received in culture bottles   Culture  Setup Time   Final    GRAM POSITIVE COCCI AEROBIC BOTTLE ONLY Organism ID to follow CRITICAL RESULT CALLED TO, READ BACK BY AND VERIFIED WITH: JASON ROBBINS @ 03/15/2024 2140 AB Performed at [facility], [address] [place], [place] [address]    Culture GRAM POSITIVE COCCI  Final   Report Status PENDING  Incomplete  Blood Culture ID Panel (Reflexed)     Status: Abnormal   Collection Time: 03/14/24  7:14 PM  Result Value Ref Range Status   Enterococcus faecalis NOT DETECTED NOT DETECTED Final   Enterococcus Faecium NOT DETECTED NOT DETECTED Final   Listeria monocytogenes NOT DETECTED NOT DETECTED Final   Staphylococcus species DETECTED (A) NOT DETECTED Final    Comment: CRITICAL RESULT CALLED TO, READ BACK BY AND VERIFIED WITH: JASON ROBBINS @ 03/15/2024 2140 AB    Staphylococcus aureus (BCID) NOT DETECTED NOT DETECTED Final   Staphylococcus epidermidis DETECTED (A) NOT DETECTED Final    Comment: Methicillin (oxacillin) resistant coagulase negative staphylococcus. Possible blood culture contaminant (unless isolated from more than  one blood culture draw or clinical case suggests pathogenicity). No antibiotic treatment is indicated for blood  culture contaminants. CRITICAL RESULT CALLED TO, READ BACK BY AND VERIFIED WITH: JASON ROBBINS @ 03/15/2024 2140 AB    Staphylococcus lugdunensis NOT DETECTED NOT DETECTED Final   Streptococcus species NOT DETECTED NOT DETECTED Final   Streptococcus agalactiae NOT DETECTED NOT DETECTED Final   Streptococcus pneumoniae NOT DETECTED NOT DETECTED Final   Streptococcus pyogenes NOT DETECTED NOT DETECTED Final   A.calcoaceticus-baumannii NOT DETECTED NOT DETECTED Final   Bacteroides fragilis NOT DETECTED NOT DETECTED Final   Enterobacterales NOT DETECTED NOT DETECTED Final   Enterobacter cloacae complex NOT DETECTED NOT DETECTED Final   Escherichia coli NOT DETECTED NOT DETECTED Final   Klebsiella aerogenes NOT DETECTED NOT DETECTED Final   Klebsiella oxytoca NOT DETECTED NOT DETECTED Final   Klebsiella pneumoniae NOT DETECTED NOT DETECTED Final   Proteus species NOT DETECTED NOT DETECTED Final   Salmonella species NOT DETECTED NOT DETECTED Final   Serratia marcescens NOT DETECTED NOT DETECTED Final   Haemophilus influenzae NOT DETECTED NOT DETECTED Final   Neisseria meningitidis NOT DETECTED NOT DETECTED Final   Pseudomonas aeruginosa NOT DETECTED NOT DETECTED Final   Stenotrophomonas maltophilia NOT DETECTED NOT DETECTED Final   Candida albicans NOT DETECTED NOT DETECTED Final   Candida auris NOT DETECTED NOT DETECTED Final   Candida glabrata NOT DETECTED NOT DETECTED Final   Candida krusei NOT DETECTED NOT DETECTED Final   Candida parapsilosis  NOT DETECTED NOT DETECTED Final   Candida tropicalis NOT DETECTED NOT DETECTED Final   Cryptococcus neoformans/gattii NOT DETECTED NOT DETECTED Final   Methicillin resistance mecA/C DETECTED (A) NOT DETECTED Final    Comment: CRITICAL RESULT CALLED TO, READ BACK BY AND VERIFIED WITH: JASON ROBBINS @ 03/15/2024 2140 AB Performed at  [facility], [address] [place], [place] [address]     Procedures and diagnostic studies:  CT CERVICAL SPINE WO CONTRAST Result Date: 03/15/2024 EXAM: CT CERVICAL SPINE WITHOUT CONTRAST 03/15/2024 02:22:42 AM TECHNIQUE: CT of the cervical spine was performed without the administration of intravenous contrast. Multiplanar reformatted images are provided for review. Automated exposure control, iterative reconstruction, and/or weight based adjustment of the mA/kV was utilized to reduce the radiation dose to as low as reasonably achievable. COMPARISON: None available. CLINICAL HISTORY: neck pain FINDINGS: BONES AND ALIGNMENT: No acute fracture or traumatic malalignment. DEGENERATIVE CHANGES: Mild degenerative disc disease without spinal canal stenosis. Multilevel facet arthrosis. No high-grade foraminal stenosis. SOFT TISSUES: No prevertebral soft tissue swelling. IMPRESSION: 1. Mild degenerative disc disease without spinal canal stenosis. 2. Multilevel facet arthrosis without high-grade foraminal stenosis. Electronically signed by: [name] Stanford MD 03/15/2024 02:31 AM EST RP Workstation: [id number]   CT Angio Chest/Abd/Pel for Dissection W and/or Wo Contrast Result Date: 03/14/2024 EXAM: CTA CHEST, ABDOMEN AND PELVIS WITH AND WITHOUT CONTRAST 03/14/2024 08:35:25 PM TECHNIQUE: CTA of the chest was performed with and without the administration of intravenous contrast. CTA of the abdomen and pelvis was performed with and without the administration of intravenous contrast. Multiplanar reformatted images are provided for review. MIP images are provided for review. Automated exposure control, iterative reconstruction, and/or weight based adjustment of the mA/kV was utilized to reduce the radiation dose to as low as reasonably achievable. COMPARISON: 12/15/2023. CLINICAL HISTORY: weakness, back pain FINDINGS: VASCULATURE: Coronary artery and aortic atherosclerosis are present. AORTA: Aortic  atherosclerosis is present. No acute finding. No abdominal aortic aneurysm. No dissection. PULMONARY ARTERIES: No pulmonary embolism with the limits of this exam. GREAT VESSELS OF AORTIC ARCH: No acute finding. No dissection. No arterial occlusion or significant stenosis. CELIAC TRUNK: No acute finding. No occlusion or significant stenosis. SUPERIOR MESENTERIC ARTERY: No acute finding. No occlusion or significant stenosis. INFERIOR MESENTERIC ARTERY: No acute finding. No occlusion or significant stenosis. RENAL ARTERIES: No acute finding. No occlusion or significant stenosis. ILIAC ARTERIES: No acute finding. No occlusion or significant stenosis. CHEST: MEDIASTINUM: The heart is borderline in size. Coronary artery atherosclerosis is present. No mediastinal lymphadenopathy. The pericardium demonstrates no acute abnormality. LUNGS AND PLEURA: Dependent opacities in the lower lobes bilaterally, left greater than right. Favor atelectasis although pneumonia cannot be excluded in the left lower lobe. No evidence of pleural effusion or pneumothorax. THORACIC BONES AND SOFT TISSUES: Healed posterior right lower rib fractures. These are unchanged since prior study. Mild depression through the inferior endplate of T6 and superior endplate of T8, new since prior study. ABDOMEN AND PELVIS: LIVER: The liver is unremarkable. GALLBLADDER AND BILE DUCTS: Gallbladder is unremarkable. No biliary ductal dilatation. SPLEEN: The spleen is unremarkable. PANCREAS: The pancreas is unremarkable. ADRENAL GLANDS: Bilateral adrenal glands demonstrate no acute abnormality. KIDNEYS, URETERS AND BLADDER: No stones in the kidneys or ureters. No hydronephrosis. No perinephric or periureteral stranding. Urinary bladder is unremarkable. GI AND BOWEL: Stomach and duodenal sweep demonstrate no acute abnormality. There is no bowel obstruction. No abnormal bowel wall thickening or distension. REPRODUCTIVE: Prior hysterectomy. No adnexal mass. PERITONEUM  AND RETROPERITONEUM: No ascites  or free air. LYMPH NODES: No lymphadenopathy. ABDOMINAL BONES AND SOFT TISSUES: Right old fracture noted at the superior and inferior left pubic rami and left sacrum. Chronic stable mild compression fracture at L5. No acute soft tissue abnormality. IMPRESSION: 1. Coronary artery, aortic atherosclerosis. No evidence of aneurysm or dissection. 2. New vertebral endplate compression fractures at T6 and T8 since prior study. 3. Dependent lower lobe opacities, left greater than right, favored atelectasis, although left lower lobe pneumonia is not excluded. Electronically signed by: [name] Crease MD 03/14/2024 09:29 PM EST RP Workstation: [id number]   DG Chest Portable 1 View Result Date: 03/14/2024 EXAM: 1 VIEW(S) XRAY OF THE CHEST 03/14/2024 07:31:00 PM COMPARISON: Comparison with 12/14/2023. CLINICAL HISTORY: weakness FINDINGS: LUNGS AND PLEURA: Shallow inspiration. Infiltration or atelectasis in the left base is new since the prior study. Probable small left pleural effusion. No pneumothorax. HEART AND MEDIASTINUM: No acute abnormality of the cardiac and mediastinal silhouettes. BONES AND SOFT TISSUES: Postoperative changes in the base of the neck. Degenerative changes in the spine and shoulders. No acute osseous abnormality. IMPRESSION: 1. New left basilar infiltration or atelectasis and probable small left pleural effusion. Electronically signed by: Elsie Gravely MD 03/14/2024 07:42 PM EST RP Workstation: [id number]               LOS: 2 days   Anita Harrell  [facility]   Pager on [email or web address]. If 7PM-7AM, please contact night-coverage at [email or web address]     03/16/2024, 8:18 AM           "

## 2024-03-16 NOTE — Consult Note (Signed)
 Consulting Department:  Inpatient medicine  Primary Physician:  Tower, [name], MD  Chief Complaint: Spine fractures  History of Present Illness: 03/16/2024 Anita Harrell is a 74 y.o. female who presents with the chief complaint of multiple spine fractures.  She has had previous spine fractures before.  She is not having any severe amount of back pain.  No new neurologic symptoms.  No new bowel or bladder issues.  No new areas of weakness numbness or tingling.  The symptoms are causing a significant impact on the patient's life.   Review of Systems:  A 10 point review of systems is negative, except for the pertinent positives and negatives detailed in the HPI.  Past Medical History: Past Medical History:  Diagnosis Date   Allergy    allergic rhinitis   Anxiety    Arrhythmia    atrial fibrillation   Arthritis    Back pain    Chest pain    CHF (congestive heart failure) (HCC)    COPD (chronic obstructive pulmonary disease) (HCC)    Depression    Emphysema of lung (HCC)    Epigastric pain    GERD (gastroesophageal reflux disease)    Goiter    History of tobacco abuse    Hyperglycemia    mild   Hyperlipidemia    Hypertension    Insomnia    Hx of   Labile blood pressure    Panic disorder    History of   Personal history of colonic polyps 06/09/2004   SVD (spontaneous vaginal delivery)    x 2    Past Surgical History: Past Surgical History:  Procedure Laterality Date   ABDOMINAL HYSTERECTOMY N/A 09/24/2012   Procedure: HYSTERECTOMY ABDOMINAL;  Surgeon: Peggye Gull, MD;  Location: [facility];  Service: Gynecology;  Laterality: N/A;   BREAST BIOPSY Right 2016   CARDIOVERSION N/A 02/09/2022   Procedure: CARDIOVERSION;  Surgeon: Perla Evalene JINNY, MD;  Location: [facility];  Service: Cardiovascular;  Laterality: N/A;   CARDIOVERSION N/A 03/02/2022   Procedure: CARDIOVERSION;  Surgeon: Perla Evalene JINNY, MD;  Location: [facility];  Service: Cardiovascular;  Laterality: N/A;    COLONOSCOPY     COLONOSCOPY  2017   ELBOW SURGERY  2004   EYE SURGERY     bilateral lasik   SALPINGOOPHORECTOMY Bilateral 09/24/2012   Procedure: SALPINGO OOPHORECTOMY;  Surgeon: Peggye Gull, MD;  Location: [facility];  Service: Gynecology;  Laterality: Bilateral;   THYROID  SURGERY     B9 massess   WART FULGURATION N/A 09/24/2012   Procedure: FULGURATION VAGINAL WART;  Surgeon: Peggye Gull, MD;  Location: [facility];  Service: Gynecology;  Laterality: N/A;   WISDOM TOOTH EXTRACTION      Allergies: Allergies as of 03/14/2024 - Reviewed 03/14/2024  Allergen Reaction Noted   Abilify [aripiprazole] Other (See Comments) 12/26/2021   Ambien  [zolpidem ] Other (See Comments) 05/26/2022   Codeine Nausea And Vomiting 07/27/2006   Lipitor [atorvastatin] Other (See Comments) 11/10/2013   Vesicare  [solifenacin ] Other (See Comments) 05/26/2022   Wellbutrin  [bupropion ] Other (See Comments) 07/23/2013    Medications: Current Medications[1]   Social History: Social History[2]  Family Medical History: Family History  Problem Relation Age of Onset   Hypertension Mother    Stroke Mother    Alcohol abuse Father    Cancer Father        lung CA   Heart disease Father        CHF   Cancer Sister  breast CA   Heart disease Sister        CHF from chemotx also has defib   Breast cancer Sister    Colon cancer Neg Hx    Esophageal cancer Neg Hx    Rectal cancer Neg Hx    Stomach cancer Neg Hx     Physical Examination: Vitals:   03/16/24 0005 03/16/24 0521  BP: (!) 127/57 (!) 109/53  Pulse: 81 92  Resp: 18 17  Temp: 97.8 F (36.6 C) 98.6 F (37 C)  SpO2: 97% 98%     General: Patient is well developed, well nourished, calm, collected, and in no apparent distress.  NEUROLOGICAL:  General: In no acute distress.   Awake, alert, oriented to person,.  Pupils equal round and reactive to light.  Facial tone is symmetric.    Strength: She is able to move her bilateral lower extremities  with at least antigravity strength on examination.  No major asymmetry or motor deficit noted.   Imaging: CT CERVICAL SPINE WO CONTRAST Result Date: 03/15/2024 EXAM: CT CERVICAL SPINE WITHOUT CONTRAST 03/15/2024 02:22:42 AM TECHNIQUE: CT of the cervical spine was performed without the administration of intravenous contrast. Multiplanar reformatted images are provided for review. Automated exposure control, iterative reconstruction, and/or weight based adjustment of the mA/kV was utilized to reduce the radiation dose to as low as reasonably achievable. COMPARISON: None available. CLINICAL HISTORY: neck pain FINDINGS: BONES AND ALIGNMENT: No acute fracture or traumatic malalignment. DEGENERATIVE CHANGES: Mild degenerative disc disease without spinal canal stenosis. Multilevel facet arthrosis. No high-grade foraminal stenosis. SOFT TISSUES: No prevertebral soft tissue swelling. IMPRESSION: 1. Mild degenerative disc disease without spinal canal stenosis. 2. Multilevel facet arthrosis without high-grade foraminal stenosis. Electronically signed by: [name] Stanford MD 03/15/2024 02:31 AM EST RP Workstation: [id number]   CT Angio Chest/Abd/Pel for Dissection W and/or Wo Contrast Result Date: 03/14/2024 EXAM: CTA CHEST, ABDOMEN AND PELVIS WITH AND WITHOUT CONTRAST 03/14/2024 08:35:25 PM TECHNIQUE: CTA of the chest was performed with and without the administration of intravenous contrast. CTA of the abdomen and pelvis was performed with and without the administration of intravenous contrast. Multiplanar reformatted images are provided for review. MIP images are provided for review. Automated exposure control, iterative reconstruction, and/or weight based adjustment of the mA/kV was utilized to reduce the radiation dose to as low as reasonably achievable. COMPARISON: 12/15/2023. CLINICAL HISTORY: weakness, back pain FINDINGS: VASCULATURE: Coronary artery and aortic atherosclerosis are present. AORTA: Aortic atherosclerosis  is present. No acute finding. No abdominal aortic aneurysm. No dissection. PULMONARY ARTERIES: No pulmonary embolism with the limits of this exam. GREAT VESSELS OF AORTIC ARCH: No acute finding. No dissection. No arterial occlusion or significant stenosis. CELIAC TRUNK: No acute finding. No occlusion or significant stenosis. SUPERIOR MESENTERIC ARTERY: No acute finding. No occlusion or significant stenosis. INFERIOR MESENTERIC ARTERY: No acute finding. No occlusion or significant stenosis. RENAL ARTERIES: No acute finding. No occlusion or significant stenosis. ILIAC ARTERIES: No acute finding. No occlusion or significant stenosis. CHEST: MEDIASTINUM: The heart is borderline in size. Coronary artery atherosclerosis is present. No mediastinal lymphadenopathy. The pericardium demonstrates no acute abnormality. LUNGS AND PLEURA: Dependent opacities in the lower lobes bilaterally, left greater than right. Favor atelectasis although pneumonia cannot be excluded in the left lower lobe. No evidence of pleural effusion or pneumothorax. THORACIC BONES AND SOFT TISSUES: Healed posterior right lower rib fractures. These are unchanged since prior study. Mild depression through the inferior endplate of T6 and superior endplate of T8, new since  prior study. ABDOMEN AND PELVIS: LIVER: The liver is unremarkable. GALLBLADDER AND BILE DUCTS: Gallbladder is unremarkable. No biliary ductal dilatation. SPLEEN: The spleen is unremarkable. PANCREAS: The pancreas is unremarkable. ADRENAL GLANDS: Bilateral adrenal glands demonstrate no acute abnormality. KIDNEYS, URETERS AND BLADDER: No stones in the kidneys or ureters. No hydronephrosis. No perinephric or periureteral stranding. Urinary bladder is unremarkable. GI AND BOWEL: Stomach and duodenal sweep demonstrate no acute abnormality. There is no bowel obstruction. No abnormal bowel wall thickening or distension. REPRODUCTIVE: Prior hysterectomy. No adnexal mass. PERITONEUM AND  RETROPERITONEUM: No ascites or free air. LYMPH NODES: No lymphadenopathy. ABDOMINAL BONES AND SOFT TISSUES: Right old fracture noted at the superior and inferior left pubic rami and left sacrum. Chronic stable mild compression fracture at L5. No acute soft tissue abnormality. IMPRESSION: 1. Coronary artery, aortic atherosclerosis. No evidence of aneurysm or dissection. 2. New vertebral endplate compression fractures at T6 and T8 since prior study. 3. Dependent lower lobe opacities, left greater than right, favored atelectasis, although left lower lobe pneumonia is not excluded. Electronically signed by: [name] Crease MD 03/14/2024 09:29 PM EST RP Workstation: [id number]   DG Chest Portable 1 View Result Date: 03/14/2024 EXAM: 1 VIEW(S) XRAY OF THE CHEST 03/14/2024 07:31:00 PM COMPARISON: Comparison with 12/14/2023. CLINICAL HISTORY: weakness FINDINGS: LUNGS AND PLEURA: Shallow inspiration. Infiltration or atelectasis in the left base is new since the prior study. Probable small left pleural effusion. No pneumothorax. HEART AND MEDIASTINUM: No acute abnormality of the cardiac and mediastinal silhouettes. BONES AND SOFT TISSUES: Postoperative changes in the base of the neck. Degenerative changes in the spine and shoulders. No acute osseous abnormality. IMPRESSION: 1. New left basilar infiltration or atelectasis and probable small left pleural effusion. Electronically signed by: Elsie Gravely MD 03/14/2024 07:42 PM EST RP Workstation: [id number]     I have personally reviewed the images and agree with the above interpretation.  Labs:    Latest Ref Rng & Units 03/16/2024    4:25 AM 03/15/2024    8:23 AM 03/14/2024    4:21 PM  CBC  WBC 4.0 - 10.5 K/uL 9.6  10.0  11.3   Hemoglobin 12.0 - 15.0 g/dL 9.1  8.6  9.8   Hematocrit 36.0 - 46.0 % 28.1  27.2  30.4   Platelets 150 - 400 K/uL 32  35  43       Latest Ref Rng & Units 03/16/2024    4:25 AM 03/15/2024    8:23 AM 03/14/2024    4:21 PM  BMP  Glucose 70 - 99  mg/dL 890  97  73   BUN 8 - 23 mg/dL 5  6  9    Creatinine 0.44 - 1.00 mg/dL 9.53  9.51  9.42   Sodium 135 - 145 mmol/L 140  139  139   Potassium 3.5 - 5.1 mmol/L 3.6  3.3  3.7   Chloride 98 - 111 mmol/L 104  104  103   CO2 22 - 32 mmol/L 25  27  26    Calcium  8.9 - 10.3 mg/dL 89.8  9.8  9.8     INR  1.5* (01/02 1621)   Assessment and Plan: Ms. Salvi is a pleasant 74 y.o. female with new evidence of thoracic endplate fractures.  Currently no neurologic compression.  Overall in doing well with minimal midline back pain.  No evidence of neurologic issues.  No radicular pain.  We did discuss the use of a TLSO brace for comfort as these are not on destabilizing  lesions and a brace would mostly be supportive in nature.  She is not interested as she is concerned about the discomfort with the brace wearing.  I let her know that she if she changes her mind we could place that consultation.  She can follow-up in our neurosurgery clinic with upright x-rays to evaluate for any worsening of the fractures.   Penne MICAEL Sharps, MD/MSCR Dept. of Neurosurgery       [1]  Current Facility-Administered Medications:    acetaminophen  (TYLENOL ) tablet 650 mg, 650 mg, Oral, Q6H PRN, Ponnala, Shruthi, MD   albuterol  (PROVENTIL ) (2.5 MG/3ML) 0.083% nebulizer solution 2.5 mg, 2.5 mg, Inhalation, Q6H PRN, Ponnala, Shruthi, MD   budesonide -glycopyrrolate -formoterol  (BREZTRI ) 160-9-4.8 MCG/ACT inhaler 2 puff, 2 puff, Inhalation, BID, Ponnala, Shruthi, MD   cefTRIAXone  (ROCEPHIN ) 1 g in sodium chloride  0.9 % 100 mL IVPB, 1 g, Intravenous, Q24H, Ponnala, Shruthi, MD, Last Rate: 200 mL/hr at 03/15/24 1701, 1 g at 03/15/24 1701   donepezil  (ARICEPT ) tablet 5 mg, 5 mg, Oral, Daily, Ponnala, Shruthi, MD, 5 mg at 03/15/24 1015   feeding supplement (ENSURE PLUS HIGH PROTEIN) liquid 237 mL, 237 mL, Oral, BID BM, Jens Durand, MD   flecainide  (TAMBOCOR ) tablet 100 mg, 100 mg, Oral, BID, Ponnala, Shruthi, MD, 100 mg at  03/15/24 2151   hydrOXYzine  (ATARAX ) tablet 25 mg, 25 mg, Oral, BID, Ponnala, Shruthi, MD, 25 mg at 03/15/24 2151   melatonin tablet 10 mg, 10 mg, Oral, QHS, Ponnala, Shruthi, MD, 10 mg at 03/15/24 0203   oxyCODONE  (Oxy IR/ROXICODONE ) immediate release tablet 2.5 mg, 2.5 mg, Oral, Q4H PRN, Ponnala, Shruthi, MD, 2.5 mg at 03/15/24 1018   [COMPLETED] pantoprazole  (PROTONIX ) injection 40 mg, 40 mg, Intravenous, Q5 min, 40 mg at 03/14/24 1946 **FOLLOWED BY** pantoprazole  (PROTONIX ) injection 40 mg, 40 mg, Intravenous, Q12H, Ponnala, Shruthi, MD, 40 mg at 03/15/24 2151   rosuvastatin  (CRESTOR ) tablet 10 mg, 10 mg, Oral, Daily, Ponnala, Shruthi, MD, 10 mg at 03/15/24 1014   sodium chloride  flush (NS) 0.9 % injection 3 mL, 3 mL, Intravenous, Q12H, Ponnala, Shruthi, MD, 3 mL at 03/15/24 2156 [2]  Social History Tobacco Use   Smoking status: Former    Current packs/day: 0.00    Average packs/day: 1.0 packs/day    Types: E-cigarettes, Cigarettes    Quit date: 2011    Years since quitting: 15.0   Smokeless tobacco: Never  Vaping Use   Vaping status: Former   Quit date: 11/18/2022  Substance Use Topics   Alcohol use: No    Alcohol/week: 0.0 standard drinks of alcohol   Drug use: No

## 2024-03-17 ENCOUNTER — Telehealth: Payer: Self-pay | Admitting: Oncology

## 2024-03-17 ENCOUNTER — Inpatient Hospital Stay: Admitting: Radiology

## 2024-03-17 DIAGNOSIS — S22000A Wedge compression fracture of unspecified thoracic vertebra, initial encounter for closed fracture: Secondary | ICD-10-CM

## 2024-03-17 DIAGNOSIS — R195 Other fecal abnormalities: Secondary | ICD-10-CM | POA: Diagnosis not present

## 2024-03-17 DIAGNOSIS — D696 Thrombocytopenia, unspecified: Secondary | ICD-10-CM

## 2024-03-17 DIAGNOSIS — N3000 Acute cystitis without hematuria: Secondary | ICD-10-CM | POA: Diagnosis not present

## 2024-03-17 DIAGNOSIS — D61818 Other pancytopenia: Secondary | ICD-10-CM | POA: Diagnosis not present

## 2024-03-17 LAB — BASIC METABOLIC PANEL WITH GFR
Anion gap: 10 (ref 5–15)
BUN: 8 mg/dL (ref 8–23)
CO2: 27 mmol/L (ref 22–32)
Calcium: 10.9 mg/dL — ABNORMAL HIGH (ref 8.9–10.3)
Chloride: 104 mmol/L (ref 98–111)
Creatinine, Ser: 0.57 mg/dL (ref 0.44–1.00)
GFR, Estimated: >60 mL/min
Glucose, Bld: 87 mg/dL (ref 70–99)
Potassium: 3.5 mmol/L (ref 3.5–5.1)
Sodium: 140 mmol/L (ref 135–145)

## 2024-03-17 LAB — GLUCOSE, CAPILLARY
Glucose-Capillary: 106 mg/dL — ABNORMAL HIGH (ref 70–99)
Glucose-Capillary: 134 mg/dL — ABNORMAL HIGH (ref 70–99)
Glucose-Capillary: 140 mg/dL — ABNORMAL HIGH (ref 70–99)
Glucose-Capillary: 91 mg/dL (ref 70–99)
Glucose-Capillary: 91 mg/dL (ref 70–99)
Glucose-Capillary: 95 mg/dL (ref 70–99)

## 2024-03-17 LAB — CBC
HCT: 27.2 % — ABNORMAL LOW (ref 36.0–46.0)
Hemoglobin: 8.9 g/dL — ABNORMAL LOW (ref 12.0–15.0)
MCH: 29.7 pg (ref 26.0–34.0)
MCHC: 32.7 g/dL (ref 30.0–36.0)
MCV: 90.7 fL (ref 80.0–100.0)
Platelets: 29 K/uL — CL (ref 150–400)
RBC: 3 MIL/uL — ABNORMAL LOW (ref 3.87–5.11)
RDW: 16.6 % — ABNORMAL HIGH (ref 11.5–15.5)
WBC: 9.2 K/uL (ref 4.0–10.5)
nRBC: 0.4 % — ABNORMAL HIGH (ref 0.0–0.2)

## 2024-03-17 LAB — RETIC PANEL
Immature Retic Fract: 23.6 % — ABNORMAL HIGH (ref 2.3–15.9)
RBC.: 2.98 MIL/uL — ABNORMAL LOW (ref 3.87–5.11)
Retic Count, Absolute: 65.9 K/uL (ref 19.0–186.0)
Retic Ct Pct: 2.2 % (ref 0.4–3.1)
Reticulocyte Hemoglobin: 33.1 pg

## 2024-03-17 LAB — HAPTOGLOBIN: Haptoglobin: 161 mg/dL (ref 42–346)

## 2024-03-17 LAB — URINE CULTURE: Culture: >=100000 — AB

## 2024-03-17 LAB — IMMATURE PLATELET FRACTION: Immature Platelet Fraction: 6.4 % (ref 1.2–8.6)

## 2024-03-17 MED ORDER — DULOXETINE HCL 30 MG PO CPEP
120.0000 mg | ORAL_CAPSULE | Freq: Every day | ORAL | Status: DC
  Administered 2024-03-17 – 2024-03-26 (×10): 120 mg via ORAL
  Filled 2024-03-17 (×10): qty 4

## 2024-03-17 NOTE — Progress Notes (Signed)
 3 rings given to husband to take home due to hand swelling.

## 2024-03-17 NOTE — Progress Notes (Signed)
 "  Hematology/Oncology Progress note [phone] [phone]     Patient Care Team: Tower, [name], MD as PCP - General (Family Medicine) Perla Evalene PARAS, MD as PCP - Cardiology (Cardiology) Rox Charleston, MD as Consulting Physician (Obstetrics and Gynecology) Tamea Dedra CROME, MD as Consulting Physician (Pulmonary Disease) Doles-Johnson, Teah, NP as Nurse Practitioner (Psychiatry) Fate Morna SAILOR, [facility] (Inactive) as Pharmacist (Pharmacist) Babara Call, MD as Consulting Physician (Oncology) Unk Corinn Skiff, MD as Consulting Physician (Gastroenterology)   Name of the patient: Anita Harrell  [id number]  1950-12-12  Date of visit: 03/17/2024   INTERVAL HISTORY-   Patient continues to feel very weak.  Lack of appetite.  No fever or chills.  Patient's son is at the bedside. Off Eliquis  due to acute anemia and stool positive for occult blood.  Easy bruising   Allergies[1]  Patient Active Problem List   Diagnosis Date Noted   Thrombocytopenia 11/18/2022    Priority: High   Easy bruising 02/19/2024    Priority: Medium    Skin lesion 02/19/2024    Priority: Medium    Leukocytosis 03/30/2010    Priority: Medium    Compression fracture of body of thoracic vertebra (HCC) 03/17/2024   Other pancytopenia (HCC) 03/16/2024   Pressure injury of skin 03/16/2024   Toe injury, left, initial encounter 02/21/2024   Cognitive impairment 02/21/2024   Tremor 02/21/2024   Dysuria 01/29/2024   Moderately severe major depression (HCC) 01/29/2024   Multiple closed pelvic fractures with disruption of pelvic circle, initial encounter (HCC) 12/16/2023   Multiple closed pelvic fractures without disruption of pelvic circle (HCC) 12/15/2023   Closed fracture of head of left radius 12/15/2023   Closed fracture of left olecranon process 12/15/2023   Lumbar compression fracture, closed, initial encounter (HCC) 12/15/2023   OSA (obstructive sleep apnea) 07/31/2023    Osteopenia 07/31/2023   COPD (chronic obstructive pulmonary disease) (HCC) 04/03/2023   Hypophosphatemia 11/19/2022   Metabolic acidosis 11/19/2022   Chronic heart failure with preserved ejection fraction (HFpEF) (HCC) 11/18/2022   Paroxysmal atrial fibrillation (HCC) 11/18/2022   Polypharmacy 05/17/2022   General weakness 02/16/2022   Poor balance 02/16/2022   Falling 02/16/2022   Hypokalemia 02/13/2022   HTN (hypertension) 02/13/2022   DDD (degenerative disc disease), lumbar 12/26/2021   Atrial fibrillation with RVR (HCC) 10/05/2021   Estrogen deficiency 06/07/2021   Overactive bladder 10/17/2019   Medicare annual wellness visit, subsequent 01/16/2019   Thyroid  nodule 05/01/2018   Electronic cigarette use 04/03/2018   Obesity (BMI 30-39.9) 04/03/2018   Lung mass 02/01/2018   Smokers' cough (HCC) 01/01/2018   Microscopic hematuria 08/13/2012   UTI (urinary tract infection) 07/22/2012   Routine general medical examination at a health care facility 07/22/2012   Prediabetes 02/21/2008   Allergic rhinitis 07/25/2007   Low back pain 06/26/2007   Personal history of goiter 07/27/2006   Hyperlipidemia 07/27/2006   PANIC DISORDER 07/27/2006   Former smoker 07/27/2006   Depression with anxiety 07/27/2006   Insomnia 07/27/2006   History of colonic polyps 06/09/2004     Past Medical History:  Diagnosis Date   Allergy    allergic rhinitis   Anxiety    Arrhythmia    atrial fibrillation   Arthritis    Back pain    Chest pain    CHF (congestive heart failure) (HCC)    COPD (chronic obstructive pulmonary disease) (HCC)    Depression    Emphysema of lung (HCC)    Epigastric pain  GERD (gastroesophageal reflux disease)    Goiter    History of tobacco abuse    Hyperglycemia    mild   Hyperlipidemia    Hypertension    Insomnia    Hx of   Labile blood pressure    Panic disorder    History of   Personal history of colonic polyps 06/09/2004   SVD (spontaneous vaginal  delivery)    x 2     Past Surgical History:  Procedure Laterality Date   ABDOMINAL HYSTERECTOMY N/A 09/24/2012   Procedure: HYSTERECTOMY ABDOMINAL;  Surgeon: Peggye Gull, MD;  Location: [facility];  Service: Gynecology;  Laterality: N/A;   BREAST BIOPSY Right 2016   CARDIOVERSION N/A 02/09/2022   Procedure: CARDIOVERSION;  Surgeon: Perla Evalene PARAS, MD;  Location: [facility];  Service: Cardiovascular;  Laterality: N/A;   CARDIOVERSION N/A 03/02/2022   Procedure: CARDIOVERSION;  Surgeon: Perla Evalene PARAS, MD;  Location: [facility];  Service: Cardiovascular;  Laterality: N/A;   COLONOSCOPY     COLONOSCOPY  2017   ELBOW SURGERY  2004   EYE SURGERY     bilateral lasik   SALPINGOOPHORECTOMY Bilateral 09/24/2012   Procedure: SALPINGO OOPHORECTOMY;  Surgeon: Peggye Gull, MD;  Location: [facility];  Service: Gynecology;  Laterality: Bilateral;   THYROID  SURGERY     B9 massess   WART FULGURATION N/A 09/24/2012   Procedure: FULGURATION VAGINAL WART;  Surgeon: Peggye Gull, MD;  Location: [facility];  Service: Gynecology;  Laterality: N/A;   WISDOM TOOTH EXTRACTION      Social History   Socioeconomic History   Marital status: Married    Spouse name: Not on file   Number of children: Not on file   Years of education: Not on file   Highest education level: Not on file  Occupational History   Not on file  Tobacco Use   Smoking status: Former    Current packs/day: 0.00    Average packs/day: 1.0 packs/day    Types: E-cigarettes, Cigarettes    Quit date: 2011    Years since quitting: 15.0   Smokeless tobacco: Never  Vaping Use   Vaping status: Former   Quit date: 11/18/2022  Substance and Sexual Activity   Alcohol use: No    Alcohol/week: 0.0 standard drinks of alcohol   Drug use: No   Sexual activity: Yes    Birth control/protection: Post-menopausal  Other Topics Concern   Not on file  Social History Narrative   Lives with husband and 2 pets; dogs.   Social Drivers of Health   Tobacco Use:  Medium Risk (03/14/2024)   Patient History    Smoking Tobacco Use: Former    Smokeless Tobacco Use: Never    Passive Exposure: Not on file  Financial Resource Strain: Low Risk  (09/19/2023)   Received from [facility]   Overall Financial Resource Strain (CARDIA)    Difficulty of Paying Living Expenses: Not hard at all  Food Insecurity: No Food Insecurity (03/15/2024)   Epic    Worried About Radiation Protection Practitioner of Food in the Last Year: Never true    Ran Out of Food in the Last Year: Never true  Transportation Needs: No Transportation Needs (03/15/2024)   Epic    Lack of Transportation (Medical): No    Lack of Transportation (Non-Medical): No  Physical Activity: Inactive (07/27/2023)   Exercise Vital Sign    Days of Exercise per Week: 0 days    Minutes of Exercise per Session: 0 min  Stress: No Stress Concern Present (07/27/2023)   [employer] of Occupational Health - Occupational Stress Questionnaire    Feeling of Stress : Only a little  Social Connections: Socially Integrated (03/15/2024)   Social Connection and Isolation Panel    Frequency of Communication with Friends and Family: Twice a week    Frequency of Social Gatherings with Friends and Family: Twice a week    Attends Religious Services: More than 4 times per year    Active Member of [employer] or Organizations: Yes    Attends Banker Meetings: 1 to 4 times per year    Marital Status: Married  Recent Concern: Social Connections - Moderately Isolated (12/16/2023)   Social Connection and Isolation Panel    Frequency of Communication with Friends and Family: Three times a week    Frequency of Social Gatherings with Friends and Family: Once a week    Attends Religious Services: Never    Database Administrator or Organizations: No    Attends Banker Meetings: Never    Marital Status: Married  Catering Manager Violence: Not At Risk (03/15/2024)   Epic    Fear of Current or Ex-Partner: No     Emotionally Abused: No    Physically Abused: No    Sexually Abused: No  Depression (PHQ2-9): High Risk (02/21/2024)   Depression (PHQ2-9)    PHQ-2 Score: 22  Alcohol Screen: Low Risk (07/27/2023)   Alcohol Screen    Last Alcohol Screening Score (AUDIT): 0  Housing: Low Risk (03/15/2024)   Epic    Unable to Pay for Housing in the Last Year: No    Number of Times Moved in the Last Year: 0    Homeless in the Last Year: No  Utilities: Not At Risk (03/15/2024)   Epic    Threatened with loss of utilities: No  Health Literacy: Adequate Health Literacy (07/27/2023)   B1300 Health Literacy    Frequency of need for help with medical instructions: Never     Family History  Problem Relation Age of Onset   Hypertension Mother    Stroke Mother    Alcohol abuse Father    Cancer Father        lung CA   Heart disease Father        CHF   Cancer Sister        breast CA   Heart disease Sister        CHF from chemotx also has defib   Breast cancer Sister    Colon cancer Neg Hx    Esophageal cancer Neg Hx    Rectal cancer Neg Hx    Stomach cancer Neg Hx     Current Medications[2]   Physical exam:  Vitals:   03/16/24 2342 03/17/24 0339 03/17/24 0838 03/17/24 1205  BP: 122/63 123/64 (!) 111/56 129/62  Pulse: 98 98 (!) 104 96  Resp: 20 20 16 18   Temp: 98.7 F (37.1 C) 99.2 F (37.3 C) 98 F (36.7 C) 98.8 F (37.1 C)  TempSrc: Oral     SpO2: 96% 94% 98% 96%  Weight:      Height:       Physical Exam Constitutional:      General: She is not in acute distress. HENT:     Head: Normocephalic and atraumatic.  Eyes:     General: No scleral icterus. Cardiovascular:     Rate and Rhythm: Normal rate.  Pulmonary:     Effort: Pulmonary effort  is normal. No respiratory distress.     Breath sounds: Normal breath sounds.  Abdominal:     General: There is no distension.     Palpations: Abdomen is soft.     Tenderness: There is no abdominal tenderness.  Musculoskeletal:        General:  Normal range of motion.     Cervical back: Normal range of motion and neck supple.  Skin:    General: Skin is warm and dry.     Coloration: Skin is pale.     Findings: Bruising present. No erythema.  Neurological:     Mental Status: She is alert and oriented to person, place, and time. Mental status is at baseline.     Motor: No abnormal muscle tone.  Psychiatric:        Mood and Affect: Mood and affect normal.       Labs    Latest Ref Rng & Units 03/17/2024    4:19 AM 03/16/2024    4:25 AM 03/15/2024    8:23 AM  CBC  WBC 4.0 - 10.5 K/uL 9.2  9.6  10.0   Hemoglobin 12.0 - 15.0 g/dL 8.9  9.1  8.6   Hematocrit 36.0 - 46.0 % 27.2  28.1  27.2   Platelets 150 - 400 K/uL 29  32  35       Latest Ref Rng & Units 03/17/2024    4:19 AM 03/16/2024    4:25 AM 03/15/2024    8:23 AM  CMP  Glucose 70 - 99 mg/dL 87  890  97   BUN 8 - 23 mg/dL 8  5  6    Creatinine 0.44 - 1.00 mg/dL 9.42  9.53  9.51   Sodium 135 - 145 mmol/L 140  140  139   Potassium 3.5 - 5.1 mmol/L 3.5  3.6  3.3   Chloride 98 - 111 mmol/L 104  104  104   CO2 22 - 32 mmol/L 27  25  27    Calcium  8.9 - 10.3 mg/dL 89.0  89.8  9.8      RADIOGRAPHIC STUDIES: I have personally reviewed the radiological images as listed and agreed with the findings in the report. CT CERVICAL SPINE WO CONTRAST Result Date: 03/15/2024 EXAM: CT CERVICAL SPINE WITHOUT CONTRAST 03/15/2024 02:22:42 AM TECHNIQUE: CT of the cervical spine was performed without the administration of intravenous contrast. Multiplanar reformatted images are provided for review. Automated exposure control, iterative reconstruction, and/or weight based adjustment of the mA/kV was utilized to reduce the radiation dose to as low as reasonably achievable. COMPARISON: None available. CLINICAL HISTORY: neck pain FINDINGS: BONES AND ALIGNMENT: No acute fracture or traumatic malalignment. DEGENERATIVE CHANGES: Mild degenerative disc disease without spinal canal stenosis. Multilevel facet  arthrosis. No high-grade foraminal stenosis. SOFT TISSUES: No prevertebral soft tissue swelling. IMPRESSION: 1. Mild degenerative disc disease without spinal canal stenosis. 2. Multilevel facet arthrosis without high-grade foraminal stenosis. Electronically signed by: [name] Stanford MD 03/15/2024 02:31 AM EST RP Workstation: [id number]   CT Angio Chest/Abd/Pel for Dissection W and/or Wo Contrast Result Date: 03/14/2024 EXAM: CTA CHEST, ABDOMEN AND PELVIS WITH AND WITHOUT CONTRAST 03/14/2024 08:35:25 PM TECHNIQUE: CTA of the chest was performed with and without the administration of intravenous contrast. CTA of the abdomen and pelvis was performed with and without the administration of intravenous contrast. Multiplanar reformatted images are provided for review. MIP images are provided for review. Automated exposure control, iterative reconstruction, and/or weight based adjustment of the mA/kV was utilized  to reduce the radiation dose to as low as reasonably achievable. COMPARISON: 12/15/2023. CLINICAL HISTORY: weakness, back pain FINDINGS: VASCULATURE: Coronary artery and aortic atherosclerosis are present. AORTA: Aortic atherosclerosis is present. No acute finding. No abdominal aortic aneurysm. No dissection. PULMONARY ARTERIES: No pulmonary embolism with the limits of this exam. GREAT VESSELS OF AORTIC ARCH: No acute finding. No dissection. No arterial occlusion or significant stenosis. CELIAC TRUNK: No acute finding. No occlusion or significant stenosis. SUPERIOR MESENTERIC ARTERY: No acute finding. No occlusion or significant stenosis. INFERIOR MESENTERIC ARTERY: No acute finding. No occlusion or significant stenosis. RENAL ARTERIES: No acute finding. No occlusion or significant stenosis. ILIAC ARTERIES: No acute finding. No occlusion or significant stenosis. CHEST: MEDIASTINUM: The heart is borderline in size. Coronary artery atherosclerosis is present. No mediastinal lymphadenopathy. The pericardium  demonstrates no acute abnormality. LUNGS AND PLEURA: Dependent opacities in the lower lobes bilaterally, left greater than right. Favor atelectasis although pneumonia cannot be excluded in the left lower lobe. No evidence of pleural effusion or pneumothorax. THORACIC BONES AND SOFT TISSUES: Healed posterior right lower rib fractures. These are unchanged since prior study. Mild depression through the inferior endplate of T6 and superior endplate of T8, new since prior study. ABDOMEN AND PELVIS: LIVER: The liver is unremarkable. GALLBLADDER AND BILE DUCTS: Gallbladder is unremarkable. No biliary ductal dilatation. SPLEEN: The spleen is unremarkable. PANCREAS: The pancreas is unremarkable. ADRENAL GLANDS: Bilateral adrenal glands demonstrate no acute abnormality. KIDNEYS, URETERS AND BLADDER: No stones in the kidneys or ureters. No hydronephrosis. No perinephric or periureteral stranding. Urinary bladder is unremarkable. GI AND BOWEL: Stomach and duodenal sweep demonstrate no acute abnormality. There is no bowel obstruction. No abnormal bowel wall thickening or distension. REPRODUCTIVE: Prior hysterectomy. No adnexal mass. PERITONEUM AND RETROPERITONEUM: No ascites or free air. LYMPH NODES: No lymphadenopathy. ABDOMINAL BONES AND SOFT TISSUES: Right old fracture noted at the superior and inferior left pubic rami and left sacrum. Chronic stable mild compression fracture at L5. No acute soft tissue abnormality. IMPRESSION: 1. Coronary artery, aortic atherosclerosis. No evidence of aneurysm or dissection. 2. New vertebral endplate compression fractures at T6 and T8 since prior study. 3. Dependent lower lobe opacities, left greater than right, favored atelectasis, although left lower lobe pneumonia is not excluded. Electronically signed by: [name] Crease MD 03/14/2024 09:29 PM EST RP Workstation: [id number]   DG Chest Portable 1 View Result Date: 03/14/2024 EXAM: 1 VIEW(S) XRAY OF THE CHEST 03/14/2024 07:31:00 PM  COMPARISON: Comparison with 12/14/2023. CLINICAL HISTORY: weakness FINDINGS: LUNGS AND PLEURA: Shallow inspiration. Infiltration or atelectasis in the left base is new since the prior study. Probable small left pleural effusion. No pneumothorax. HEART AND MEDIASTINUM: No acute abnormality of the cardiac and mediastinal silhouettes. BONES AND SOFT TISSUES: Postoperative changes in the base of the neck. Degenerative changes in the spine and shoulders. No acute osseous abnormality. IMPRESSION: 1. New left basilar infiltration or atelectasis and probable small left pleural effusion. Electronically signed by: Elsie Gravely MD 03/14/2024 07:42 PM EST RP Workstation: [id number]    Assessment and plan-   # Acute on chronic thrombocytopenia. Outpatient workup showed normal B12, folate, iron panel was not consistent with iron deficiency.  Multiple myeloma panel showed IgM lambda MGUS with M protein of 0.3.  LDH was elevated 404.  Negative peripheral blood flow cytometry.  Normal immature platelet fraction-indicating decreased bone marrow production. Inpatient workup showed normal haptoglobin, less likely hemolysis.  CT scan showed no lymphadenopathy. UTI may attribute to further decrease of platelet counts.  #  Anemia, acute.  GI blood loss versus marrow suppression secondary to UTI.  Blood work indicating no hemolysis.  I recommend bone marrow biopsy for further evaluation.  Patient and son agree with bone marrow biopsy and preferred to get it done during this hospitalization.  # Recurrent UTI, antibiotics. # Weakness, PT OT evaluation. Thank you for allowing me to participate in the care of this patient.   [name], MD, PhD Hematology Oncology 03/17/2024      [1]  Allergies Allergen Reactions   Abilify [Aripiprazole] Other (See Comments)    Vision problems   Ambien  [Zolpidem ] Other (See Comments)    Hallucinations MS change    Codeine Nausea And Vomiting    Patient able to tolerate hydrocodone   and oxycodone    Lipitor [Atorvastatin] Other (See Comments)    Myalgias    Vesicare  [Solifenacin ] Other (See Comments)    MS change   Wellbutrin  [Bupropion ] Other (See Comments)    Unknown reaction  [2]  Current Facility-Administered Medications:    acetaminophen  (TYLENOL ) tablet 650 mg, 650 mg, Oral, Q6H PRN, Ponnala, Shruthi, MD   albuterol  (PROVENTIL ) (2.5 MG/3ML) 0.083% nebulizer solution 2.5 mg, 2.5 mg, Inhalation, Q6H PRN, Ponnala, Shruthi, MD   budesonide -glycopyrrolate -formoterol  (BREZTRI ) 160-9-4.8 MCG/ACT inhaler 2 puff, 2 puff, Inhalation, BID, Ponnala, Shruthi, MD, 2 puff at 03/17/24 0945   cefTRIAXone  (ROCEPHIN ) 1 g in sodium chloride  0.9 % 100 mL IVPB, 1 g, Intravenous, Q24H, Ponnala, Shruthi, MD, Last Rate: 200 mL/hr at 03/16/24 1804, 1 g at 03/16/24 1804   donepezil  (ARICEPT ) tablet 5 mg, 5 mg, Oral, Daily, Ponnala, Shruthi, MD, 5 mg at 03/17/24 0944   feeding supplement (ENSURE PLUS HIGH PROTEIN) liquid 237 mL, 237 mL, Oral, BID BM, Jens Durand, MD, 237 mL at 03/17/24 0945   flecainide  (TAMBOCOR ) tablet 100 mg, 100 mg, Oral, BID, Ponnala, Shruthi, MD, 100 mg at 03/17/24 0944   hydrOXYzine  (ATARAX ) tablet 25 mg, 25 mg, Oral, BID, Ponnala, Shruthi, MD, 25 mg at 03/17/24 0944   melatonin tablet 10 mg, 10 mg, Oral, QHS, Ponnala, Shruthi, MD, 10 mg at 03/16/24 2138   oxyCODONE  (Oxy IR/ROXICODONE ) immediate release tablet 2.5 mg, 2.5 mg, Oral, Q4H PRN, Ponnala, Shruthi, MD, 2.5 mg at 03/16/24 1802   [COMPLETED] pantoprazole  (PROTONIX ) injection 40 mg, 40 mg, Intravenous, Q5 min, 40 mg at 03/14/24 1946 **FOLLOWED BY** pantoprazole  (PROTONIX ) injection 40 mg, 40 mg, Intravenous, Q12H, Ponnala, Shruthi, MD, 40 mg at 03/17/24 0944   rosuvastatin  (CRESTOR ) tablet 10 mg, 10 mg, Oral, Daily, Ponnala, Shruthi, MD, 10 mg at 03/17/24 0944   sodium chloride  flush (NS) 0.9 % injection 3 mL, 3 mL, Intravenous, Q12H, Ponnala, Shruthi, MD, 3 mL at 03/17/24 0945  "

## 2024-03-17 NOTE — Progress Notes (Addendum)
 "    Progress Note    Anita Harrell  FMW:[id number] DOB: 09/07/1950  DOA: 03/14/2024 PCP: Randeen [name], MD      Brief Narrative:    Medical records reviewed and are as summarized below:  Anita Harrell is a 74 y.o. female  with medical history significant of chronic HFpEF, paroxysmal A-fib, HLD, dementia, depression, and generalized anxiety disorder, chronic neck and back pain, OSA, who presented to the ED because of general weakness, increasing confusion, neck and back pain.  He has chronic neck and back pain/lumbar radiculopathy and recently got steroid injection on 03/03/2024.  Since discharge from the rehab facility about a week before Thanksgiving, patient's condition has declined.  She has gotten weaker and weaker and she has been intermittently confused and confusion is typically worse in the evenings.  Husband and family has become increasingly difficult to take care of her at home.  She is to be able to ambulate with a walker.  She is now unable to ambulate.  Recently got 2 rounds of antibiotics for UTI.  She was prescribed 5 days of nitrofurantoin by her neurologist on 01/24/2024.  She was given second round of antibiotics with Keflex  on 02/21/2024.  Urine culture from 02/21/2024 showed pansensitive E. coli.   Vitals in the ED: Temperature 97.4, respiratory rate 12, BP 104/38, oxygen  saturation 99% on 2 L oxygen .   CT angio chest/abdomen and pelvis IMPRESSION: 1. Coronary artery, aortic atherosclerosis. No evidence of aneurysm or dissection. 2. New vertebral endplate compression fractures at T6 and T8 since prior study. 3. Dependent lower lobe opacities, left greater than right, favored atelectasis, although left lower lobe pneumonia is not excluded.    CT cervical spine IMPRESSION: 1. Mild degenerative disc disease without spinal canal stenosis. 2. Multilevel facet arthrosis without high-grade foraminal stenosis.     Assessment/Plan:   Principal Problem:    UTI (urinary tract infection) Active Problems:   Thrombocytopenia   Other pancytopenia (HCC)   Pressure injury of skin   Compression fracture of body of thoracic vertebra (HCC)    Body mass index is 28.52 kg/m.  Acute UTI: Urine culture showed E. coli.  Continue IV ceftriaxone .    Recently treated for UTI on 2 occasions. She was given a prescription for Keflex  for UTI and right breast wound on 02/21/2024.  Urine culture from 02/21/2024 showed pansensitive E. coli. Patient was given a prescription for nitrofurantoin for 5 days on 01/24/2024 when she saw Dr. Maree, neurologist, in the office.  Staph epidermidis bacteremia: 1 out of 2 bottles positive for Staph epidermidis.  This is likely a contaminant.   Dependent lower lobe opacities: Unable to exclude pneumonia.  No respiratory symptoms at this time.  She uses 2 L oxygen  at home as needed for chronic hypoxic respiratory failure.   Hypotension: BP improved with IV fluids.   Positive heme stools, acute anemia: H&H stable.  Hemoglobin trend 9.8-8.6-9.1.  Hemoglobin was 12.9 on 02/19/2024.  Unknown if this is due to GI blood loss.  Continue IV Protonix .  Eliquis  has been held.  Case discussed with Dr. Unk, gastroenterologist on-call, on 03/16/2023. No plan for inpatient endoscopic workup.  Outpatient follow-up recommended. She has no plan for endoscopic evaluation at this time given significant thrombocytopenia and lack of overt bleeding and absence of iron deficiency anemia. Patient can follow-up with gastroenterologist as an outpatient. Iron studies inconsistent with iron deficiency anemia: Iron 43, TIBC 135, saturation ratio 32, ferritin 358  Acute on chronic  thrombocytopenia: Platelet down from 43 to 35-32-29. Platelet was 112 on 02/19/2024. Dr. Babara, hematologist, requested that I consult IR for bone marrow biopsy.  Order has been placed.   T6 and T8 fracture: Patient said she is not in any pain.She has been evaluated by Dr.  Claudene, neurosurgeon.  He recommended conservative management and TLSO brace for comfort.  However, patient declines TLSO brace. Chronic neck and back pain, chronic lumbar radiculopathy and greater trochanteric bursitis of left hip: Recent steroid injection for lumbar radiculopathy on 03/03/2024.   General Weakness: PT and OT recommended discharge to SNF   Hypokalemia: Improved   Dementia: Continue supportive care. He sees Dr. Maree, neurologist, as an outpatient.   S/p hypoglycemia: Improved    Chronic HFpEF: Compensated. proBNP 1237.   Paroxysmal atrial fibrillation: Continue flecainide . Eliquis  remains on hold because of significant thrombocytopenia.  Discussed risk of holding Eliquis  for atrial fibrillation with the husband at the bedside.   Stage II right buttock, stage II right coccyx, unstageable right buttock and stage I left buttock decubitus ulcers, right breast ulcer: These were all present on admission.  Continue local wound care.   Comorbidities include depression, generalized anxiety disorder, solitary right thyroid  nodule   Diet Order             Diet regular Fluid consistency: Thin  Diet effective now                                  Consultants: Neurosurgeon Gastroenterology  Procedures: None    Medications:    budesonide -glycopyrrolate -formoterol   2 puff Inhalation BID   donepezil   5 mg Oral Daily   feeding supplement  237 mL Oral BID BM   flecainide   100 mg Oral BID   hydrOXYzine   25 mg Oral BID   melatonin  10 mg Oral QHS   pantoprazole  (PROTONIX ) IV  40 mg Intravenous Q12H   rosuvastatin   10 mg Oral Daily   sodium chloride  flush  3 mL Intravenous Q12H   Continuous Infusions:  cefTRIAXone  (ROCEPHIN )  IV 1 g (03/16/24 1804)     Anti-infectives (From admission, onward)    Start     Dose/Rate Route Frequency Ordered Stop   03/15/24 1730  cefTRIAXone  (ROCEPHIN ) 1 g in sodium chloride  0.9 % 100 mL IVPB        1  g 200 mL/hr over 30 Minutes Intravenous Every 24 hours 03/15/24 0147     03/14/24 2300  azithromycin  (ZITHROMAX ) 500 mg in sodium chloride  0.9 % 250 mL IVPB        500 mg 250 mL/hr over 60 Minutes Intravenous  Once 03/14/24 2255 03/15/24 0021   03/14/24 1730  cefTRIAXone  (ROCEPHIN ) 1 g in sodium chloride  0.9 % 100 mL IVPB        1 g 200 mL/hr over 30 Minutes Intravenous  Once 03/14/24 1720 03/14/24 1758              Family Communication/Anticipated D/C date and plan/Code Status   DVT prophylaxis: SCDs Start: 03/15/24 0143     Code Status: Limited: Do not attempt resuscitation (DNR) -DNR-LIMITED -Do Not Intubate/DNI   Family Communication: Discussed with her husband at the bedside. Disposition Plan: May need to be discharged to SNF   Status is: Inpatient Remains inpatient appropriate because: General weakness       Subjective:    Interval events noted.  She has no complaints.  She  said she is feeling okay.  Husband at the bedside.  Blood  Objective:    Vitals:   03/16/24 2342 03/17/24 0339 03/17/24 0838 03/17/24 1205  BP: 122/63 123/64 (!) 111/56 129/62  Pulse: 98 98 (!) 104 96  Resp: 20 20 16 18   Temp: 98.7 F (37.1 C) 99.2 F (37.3 C) 98 F (36.7 C) 98.8 F (37.1 C)  TempSrc: Oral     SpO2: 96% 94% 98% 96%  Weight:      Height:       No data found.    Intake/Output Summary (Last 24 hours) at 03/17/2024 1206 Last data filed at 03/16/2024 2140 Gross per 24 hour  Intake 120 ml  Output --  Net 120 ml   Filed Weights   03/14/24 1626  Weight: 73 kg    Exam:  GEN: NAD SKIN: Warm and dry EYES: No pallor or icterus ENT: MMM CV: RRR PULM: CTA B ABD: soft, ND, NT, +BS CNS: AAO x 1 (person), non focal EXT: No edema or tenderness      Wound 03/15/24 1308 Pressure Injury Buttocks Right Stage 2 -  Partial thickness loss of dermis presenting as a shallow open injury with a red, pink wound bed without slough. (Active)     Wound 03/15/24 1309  Pressure Injury Coccyx Right Stage 2 -  Partial thickness loss of dermis presenting as a shallow open injury with a red, pink wound bed without slough. (Active)     Wound 03/15/24 1310 Pressure Injury Buttocks Left Stage 1 -  Intact skin with non-blanchable redness of a localized area usually over a bony prominence. (Active)     Wound 03/15/24 1413 Pressure Injury Buttocks Right Unstageable - Full thickness tissue loss in which the base of the injury is covered by slough (yellow, tan, gray, green or brown) and/or eschar (tan, brown or black) in the wound bed. (Active)     Data Reviewed:   I have personally reviewed following labs and imaging studies:  Labs: Labs show the following:   Basic Metabolic Panel: Recent Labs  Lab 03/14/24 1621 03/15/24 0823 03/16/24 0425 03/17/24 0419  NA 139 139 140 140  K 3.7 3.3* 3.6 3.5  CL 103 104 104 104  CO2 26 27 25 27   GLUCOSE 73 97 109* 87  BUN 9 6* 5* 8  CREATININE 0.57 0.48 0.46 0.57  CALCIUM  9.8 9.8 10.1 10.9*  MG  --   --  1.8  --    GFR Estimated Creatinine Clearance: 58.1 mL/min (by C-G formula based on SCr of 0.57 mg/dL). Liver Function Tests: Recent Labs  Lab 03/14/24 1621  AST 40  ALT 10  ALKPHOS 119  BILITOT 0.5  PROT 5.0*  ALBUMIN  2.5*   No results for input(s): LIPASE, AMYLASE in the last 168 hours. No results for input(s): AMMONIA in the last 168 hours. Coagulation profile Recent Labs  Lab 03/14/24 1621  INR 1.5*    CBC: Recent Labs  Lab 03/14/24 1621 03/15/24 0823 03/16/24 0425 03/17/24 0419  WBC 11.3* 10.0 9.6 9.2  HGB 9.8* 8.6* 9.1* 8.9*  HCT 30.4* 27.2* 28.1* 27.2*  MCV 91.3 92.2 90.9 90.7  PLT 43* 35* 32* 29*   Cardiac Enzymes: No results for input(s): CKTOTAL, CKMB, CKMBINDEX, TROPONINI in the last 168 hours. BNP (last 3 results) Recent Labs    03/15/24 0823  PROBNP 1,237.0*   CBG: Recent Labs  Lab 03/16/24 2007 03/16/24 2339 03/17/24 0335 03/17/24 9165 03/17/24 1201  GLUCAP 110* 101* 91 140* 91   D-Dimer: No results for input(s): DDIMER in the last 72 hours. Hgb A1c: No results for input(s): HGBA1C in the last 72 hours. Lipid Profile: No results for input(s): CHOL, HDL, LDLCALC, TRIG, CHOLHDL, LDLDIRECT in the last 72 hours. Thyroid  function studies: No results for input(s): TSH, T4TOTAL, T3FREE, THYROIDAB in the last 72 hours.  Invalid input(s): FREET3 Anemia work up: Recent Labs    03/15/24 0823 03/16/24 0425  FERRITIN 358*  --   TIBC 135*  --   IRON 43  --   RETICCTPCT  --  2.3   Sepsis Labs: Recent Labs  Lab 03/14/24 1621 03/14/24 1927 03/15/24 0823 03/16/24 0425 03/17/24 0419  WBC 11.3*  --  10.0 9.6 9.2  LATICACIDVEN  --  1.8  --   --   --     Microbiology Recent Results (from the past 240 hours)  Urine Culture     Status: Abnormal   Collection Time: 03/14/24  4:21 PM   Specimen: Urine, Clean Catch  Result Value Ref Range Status   Specimen Description   Final    URINE, CLEAN CATCH Performed at [facility], [address], [place], [place] [address]    Special Requests   Final    NONE Performed at [facility], [address] [place], [place] [address]    Culture >=100,000 COLONIES/mL ESCHERICHIA COLI (A)  Final   Report Status 03/17/2024 FINAL  Final   Organism ID, Bacteria ESCHERICHIA COLI (A)  Final      Susceptibility   Escherichia coli - MIC*    AMPICILLIN <=2 SENSITIVE Sensitive     CEFAZOLIN  (URINE) Value in next row Sensitive      <=1 SENSITIVEThis is a modified FDA-approved test that has been validated and its performance characteristics determined by the reporting laboratory.  This laboratory is certified under the Clinical Laboratory Improvement Amendments CLIA as qualified to perform high complexity clinical laboratory testing.    CEFEPIME Value in next row Sensitive      <=1 SENSITIVEThis is a modified FDA-approved test that has been validated and its  performance characteristics determined by the reporting laboratory.  This laboratory is certified under the Clinical Laboratory Improvement Amendments CLIA as qualified to perform high complexity clinical laboratory testing.    ERTAPENEM Value in next row Sensitive      <=1 SENSITIVEThis is a modified FDA-approved test that has been validated and its performance characteristics determined by the reporting laboratory.  This laboratory is certified under the Clinical Laboratory Improvement Amendments CLIA as qualified to perform high complexity clinical laboratory testing.    CEFTRIAXONE  Value in next row Sensitive      <=1 SENSITIVEThis is a modified FDA-approved test that has been validated and its performance characteristics determined by the reporting laboratory.  This laboratory is certified under the Clinical Laboratory Improvement Amendments CLIA as qualified to perform high complexity clinical laboratory testing.    CIPROFLOXACIN  Value in next row Sensitive      <=1 SENSITIVEThis is a modified FDA-approved test that has been validated and its performance characteristics determined by the reporting laboratory.  This laboratory is certified under the Clinical Laboratory Improvement Amendments CLIA as qualified to perform high complexity clinical laboratory testing.    GENTAMICIN Value in next row Sensitive      <=1 SENSITIVEThis is a modified FDA-approved test that has been validated and its performance characteristics determined by the reporting laboratory.  This laboratory is certified  under the Clinical Laboratory Improvement Amendments CLIA as qualified to perform high complexity clinical laboratory testing.    NITROFURANTOIN Value in next row Sensitive      <=1 SENSITIVEThis is a modified FDA-approved test that has been validated and its performance characteristics determined by the reporting laboratory.  This laboratory is certified under the Clinical Laboratory Improvement Amendments CLIA as  qualified to perform high complexity clinical laboratory testing.    TRIMETH /SULFA  Value in next row Sensitive      <=1 SENSITIVEThis is a modified FDA-approved test that has been validated and its performance characteristics determined by the reporting laboratory.  This laboratory is certified under the Clinical Laboratory Improvement Amendments CLIA as qualified to perform high complexity clinical laboratory testing.    AMPICILLIN/SULBACTAM Value in next row Sensitive      <=1 SENSITIVEThis is a modified FDA-approved test that has been validated and its performance characteristics determined by the reporting laboratory.  This laboratory is certified under the Clinical Laboratory Improvement Amendments CLIA as qualified to perform high complexity clinical laboratory testing.    PIP/TAZO Value in next row Sensitive      <=4 SENSITIVEThis is a modified FDA-approved test that has been validated and its performance characteristics determined by the reporting laboratory.  This laboratory is certified under the Clinical Laboratory Improvement Amendments CLIA as qualified to perform high complexity clinical laboratory testing.    MEROPENEM Value in next row Sensitive      <=4 SENSITIVEThis is a modified FDA-approved test that has been validated and its performance characteristics determined by the reporting laboratory.  This laboratory is certified under the Clinical Laboratory Improvement Amendments CLIA as qualified to perform high complexity clinical laboratory testing.    * >=100,000 COLONIES/mL ESCHERICHIA COLI  Blood culture (routine x 2)     Status: Abnormal (Preliminary result)   Collection Time: 03/14/24  7:14 PM   Specimen: BLOOD  Result Value Ref Range Status   Specimen Description   Final    BLOOD LEFT ANTECUBITAL Performed at [facility], [address], [place], [place] [address]    Special Requests   Final    BOTTLES DRAWN AEROBIC AND ANAEROBIC Blood Culture results may not be  optimal due to an inadequate volume of blood received in culture bottles Performed at [facility], [address], [place], [place] [address]    Culture  Setup Time   Final    GRAM POSITIVE COCCI AEROBIC BOTTLE ONLY Organism ID to follow CRITICAL RESULT CALLED TO, READ BACK BY AND VERIFIED WITH: JASON ROBBINS @ 03/15/2024 2140 AB Performed at [facility], [address] [place], [place] [address]    Culture (A)  Final    STAPHYLOCOCCUS EPIDERMIDIS THE SIGNIFICANCE OF ISOLATING THIS ORGANISM FROM A SINGLE SET OF BLOOD CULTURES WHEN MULTIPLE SETS ARE DRAWN IS UNCERTAIN. PLEASE NOTIFY THE MICROBIOLOGY DEPARTMENT WITHIN ONE WEEK IF SPECIATION AND SENSITIVITIES ARE REQUIRED. Performed at [facility], [address] [place], [place] [address]    Report Status PENDING  Incomplete  Blood Culture ID Panel (Reflexed)     Status: Abnormal   Collection Time: 03/14/24  7:14 PM  Result Value Ref Range Status   Enterococcus faecalis NOT DETECTED NOT DETECTED Final   Enterococcus Faecium NOT DETECTED NOT DETECTED Final   Listeria monocytogenes NOT DETECTED NOT DETECTED Final   Staphylococcus species DETECTED (A) NOT DETECTED Final    Comment: CRITICAL RESULT CALLED TO, READ BACK BY AND VERIFIED WITH: JASON ROBBINS @  03/15/2024 2140 AB    Staphylococcus aureus (BCID) NOT DETECTED NOT DETECTED Final   Staphylococcus epidermidis DETECTED (A) NOT DETECTED Final    Comment: Methicillin (oxacillin) resistant coagulase negative staphylococcus. Possible blood culture contaminant (unless isolated from more than one blood culture draw or clinical case suggests pathogenicity). No antibiotic treatment is indicated for blood  culture contaminants. CRITICAL RESULT CALLED TO, READ BACK BY AND VERIFIED WITH: JASON ROBBINS @ 03/15/2024 2140 AB    Staphylococcus lugdunensis NOT DETECTED NOT DETECTED Final   Streptococcus species NOT DETECTED NOT DETECTED Final   Streptococcus  agalactiae NOT DETECTED NOT DETECTED Final   Streptococcus pneumoniae NOT DETECTED NOT DETECTED Final   Streptococcus pyogenes NOT DETECTED NOT DETECTED Final   A.calcoaceticus-baumannii NOT DETECTED NOT DETECTED Final   Bacteroides fragilis NOT DETECTED NOT DETECTED Final   Enterobacterales NOT DETECTED NOT DETECTED Final   Enterobacter cloacae complex NOT DETECTED NOT DETECTED Final   Escherichia coli NOT DETECTED NOT DETECTED Final   Klebsiella aerogenes NOT DETECTED NOT DETECTED Final   Klebsiella oxytoca NOT DETECTED NOT DETECTED Final   Klebsiella pneumoniae NOT DETECTED NOT DETECTED Final   Proteus species NOT DETECTED NOT DETECTED Final   Salmonella species NOT DETECTED NOT DETECTED Final   Serratia marcescens NOT DETECTED NOT DETECTED Final   Haemophilus influenzae NOT DETECTED NOT DETECTED Final   Neisseria meningitidis NOT DETECTED NOT DETECTED Final   Pseudomonas aeruginosa NOT DETECTED NOT DETECTED Final   Stenotrophomonas maltophilia NOT DETECTED NOT DETECTED Final   Candida albicans NOT DETECTED NOT DETECTED Final   Candida auris NOT DETECTED NOT DETECTED Final   Candida glabrata NOT DETECTED NOT DETECTED Final   Candida krusei NOT DETECTED NOT DETECTED Final   Candida parapsilosis NOT DETECTED NOT DETECTED Final   Candida tropicalis NOT DETECTED NOT DETECTED Final   Cryptococcus neoformans/gattii NOT DETECTED NOT DETECTED Final   Methicillin resistance mecA/C DETECTED (A) NOT DETECTED Final    Comment: CRITICAL RESULT CALLED TO, READ BACK BY AND VERIFIED WITH: JASON ROBBINS @ 03/15/2024 2140 AB Performed at [facility], [address] [place], [place] [address]     Procedures and diagnostic studies:  No results found.              LOS: 3 days   Jestine Bicknell  [facility]   Pager on [email or web address]. If 7PM-7AM, please contact night-coverage at [email or web address]     03/17/2024, 12:06 PM           "

## 2024-03-17 NOTE — Care Management Important Message (Signed)
 Important Message  Patient Details  Name: Anita Harrell MRN: [id number] Date of Birth: [birth year]   Important Message Given:  Yes - Medicare IM     Rojelio [name] 03/17/2024, 2:44 PM

## 2024-03-17 NOTE — Telephone Encounter (Signed)
 Pt spouse called to cancel appt for 1/8 - pt is in hospital - they will call back to r/s after d/c - [facility]

## 2024-03-17 NOTE — TOC Initial Note (Signed)
 Transition of Care [facility]) - Initial/Assessment Note    Patient Details  Name: Anita Harrell MRN: [id number] Date of Birth: March 07, 1951  Transition of Care [facility]) CM/SW Contact:    Shasta DELENA Daring, RN Phone Number: 03/17/2024, 2:34 PM  Clinical Narrative:      RNCM met with patient and her son at the bedside. Son requested we talk to her husband when he returns.  Son said that patient was in SNF at [facility] recently (approxiately 40 days). [place] it would be ok to go ahead and start SNF search and talk with patient husband later.  PASSAR confirmed: [id number]  FL2 complete  Expected Discharge Plan: Skilled Nursing Facility Barriers to Discharge: Continued Medical Work up   Patient Goals and CMS Choice            Expected Discharge Plan and Services In-house Referral: Clinical Social Work     Living arrangements for the past 2 months: Single Family Home                                      Prior Living Arrangements/Services Living arrangements for the past 2 months: Single Family Home Lives with:: Spouse Patient language and need for interpreter reviewed:: Yes Do you feel safe going back to the place where you live?: Yes      Need for Family Participation in Patient Care: Yes (Comment) Care giver support system in place?: Yes (comment)   Criminal Activity/Legal Involvement Pertinent to Current Situation/Hospitalization: No - Comment as needed  Activities of Daily Living   ADL Screening (condition at time of admission) Independently performs ADLs?: No Does the patient have a NEW difficulty with bathing/dressing/toileting/self-feeding that is expected to last >3 days?: No Does the patient have a NEW difficulty with getting in/out of bed, walking, or climbing stairs that is expected to last >3 days?: No Does the patient have a NEW difficulty with communication that is expected to last >3 days?: No Is the patient deaf or have difficulty hearing?: Yes Does the  patient have difficulty seeing, even when wearing glasses/contacts?: No Does the patient have difficulty concentrating, remembering, or making decisions?: No  Permission Sought/Granted Permission sought to share information with : Case Manager Permission granted to share information with : Yes, Verbal Permission Granted              Emotional Assessment Appearance:: Appears stated age Attitude/Demeanor/Rapport: Unable to Assess Affect (typically observed): Unable to Assess   Alcohol / Substance Use: Not Applicable Psych Involvement: No (comment)  Admission diagnosis:  Lower urinary tract infectious disease [N39.0] UTI (urinary tract infection) [N39.0] Weakness [R53.1] Gastrointestinal hemorrhage, unspecified gastrointestinal hemorrhage type [K92.2] Anemia, unspecified type [D64.9] Patient Active Problem List   Diagnosis Date Noted   Compression fracture of body of thoracic vertebra (HCC) 03/17/2024   Occult blood in stools 03/17/2024   Other pancytopenia (HCC) 03/16/2024   Pressure injury of skin 03/16/2024   Toe injury, left, initial encounter 02/21/2024   Cognitive impairment 02/21/2024   Tremor 02/21/2024   Easy bruising 02/19/2024   Skin lesion 02/19/2024   Dysuria 01/29/2024   Moderately severe major depression (HCC) 01/29/2024   Multiple closed pelvic fractures with disruption of pelvic circle, initial encounter (HCC) 12/16/2023   Multiple closed pelvic fractures without disruption of pelvic circle (HCC) 12/15/2023   Closed fracture of head of left radius 12/15/2023   Closed fracture of left olecranon  process 12/15/2023   Lumbar compression fracture, closed, initial encounter (HCC) 12/15/2023   OSA (obstructive sleep apnea) 07/31/2023   Osteopenia 07/31/2023   COPD (chronic obstructive pulmonary disease) (HCC) 04/03/2023   Hypophosphatemia 11/19/2022   Metabolic acidosis 11/19/2022   Chronic heart failure with preserved ejection fraction (HFpEF) (HCC) 11/18/2022    Paroxysmal atrial fibrillation (HCC) 11/18/2022   Thrombocytopenia 11/18/2022   Polypharmacy 05/17/2022   General weakness 02/16/2022   Poor balance 02/16/2022   Falling 02/16/2022   Hypokalemia 02/13/2022   HTN (hypertension) 02/13/2022   DDD (degenerative disc disease), lumbar 12/26/2021   Atrial fibrillation with RVR (HCC) 10/05/2021   Estrogen deficiency 06/07/2021   Overactive bladder 10/17/2019   Medicare annual wellness visit, subsequent 01/16/2019   Thyroid  nodule 05/01/2018   Electronic cigarette use 04/03/2018   Obesity (BMI 30-39.9) 04/03/2018   Lung mass 02/01/2018   Smokers' cough (HCC) 01/01/2018   Microscopic hematuria 08/13/2012   UTI (urinary tract infection) 07/22/2012   Routine general medical examination at a health care facility 07/22/2012   Leukocytosis 03/30/2010   Prediabetes 02/21/2008   Allergic rhinitis 07/25/2007   Low back pain 06/26/2007   Personal history of goiter 07/27/2006   Hyperlipidemia 07/27/2006   PANIC DISORDER 07/27/2006   Former smoker 07/27/2006   Depression with anxiety 07/27/2006   Insomnia 07/27/2006   History of colonic polyps 06/09/2004   PCP:  Randeen [name], MD Pharmacy:   [facility] [place], [place] - [address] [place] [address] Phone: [phone] Fax: [phone]  [id number] - [place], [place] - [address] AT [facility] [address] [facility] [address] [place] [address] Phone: [phone] Fax: [phone]     Social Drivers of Health (SDOH) Social History: SDOH Screenings   Food Insecurity: No Food Insecurity (03/15/2024)  Housing: Low Risk (03/15/2024)  Transportation Needs: No Transportation Needs (03/15/2024)  Utilities: Not At Risk (03/15/2024)  Alcohol Screen: Low Risk (07/27/2023)  Depression (PHQ2-9): High Risk (02/21/2024)  Financial Resource Strain: Low Risk  (09/19/2023)   Received from [facility]   Physical Activity: Inactive (07/27/2023)  Social Connections: Socially Integrated (03/15/2024)  Recent Concern: Social Connections - Moderately Isolated (12/16/2023)  Stress: No Stress Concern Present (07/27/2023)  Tobacco Use: Medium Risk (03/14/2024)  Health Literacy: Adequate Health Literacy (07/27/2023)   SDOH Interventions:     Readmission Risk Interventions     No data to display

## 2024-03-17 NOTE — Plan of Care (Signed)
  Problem: Clinical Measurements: Goal: Ability to maintain clinical measurements within normal limits will improve Outcome: Progressing   Problem: Pain Managment: Goal: General experience of comfort will improve and/or be controlled Outcome: Progressing   Problem: Safety: Goal: Ability to remain free from injury will improve Outcome: Progressing   Problem: Skin Integrity: Goal: Risk for impaired skin integrity will decrease Outcome: Progressing

## 2024-03-17 NOTE — [place] FL2 (Signed)
 " [place]  MEDICAID FL2 LEVEL OF CARE FORM     IDENTIFICATION  Patient Name: Anita Harrell Birthdate: Jun 05, 1950 Sex: female Admission Date (Current Location): 03/14/2024  [facility] and [place] Number:  Chiropodist and Address:  [facility], [address], [place], [place] [address]      Provider Number: [id number]  Attending Physician Name and Address:  Jens Durand, MD  Relative Name and Phone Number:       Current Level of Care: Hospital Recommended Level of Care: Skilled Nursing Facility Prior Approval Number:    Date Approved/Denied:   PASRR Number: [id number]  Discharge Plan: SNF    Current Diagnoses: Patient Active Problem List   Diagnosis Date Noted   Compression fracture of body of thoracic vertebra (HCC) 03/17/2024   Occult blood in stools 03/17/2024   Other pancytopenia (HCC) 03/16/2024   Pressure injury of skin 03/16/2024   Toe injury, left, initial encounter 02/21/2024   Cognitive impairment 02/21/2024   Tremor 02/21/2024   Easy bruising 02/19/2024   Skin lesion 02/19/2024   Dysuria 01/29/2024   Moderately severe major depression (HCC) 01/29/2024   Multiple closed pelvic fractures with disruption of pelvic circle, initial encounter (HCC) 12/16/2023   Multiple closed pelvic fractures without disruption of pelvic circle (HCC) 12/15/2023   Closed fracture of head of left radius 12/15/2023   Closed fracture of left olecranon process 12/15/2023   Lumbar compression fracture, closed, initial encounter (HCC) 12/15/2023   OSA (obstructive sleep apnea) 07/31/2023   Osteopenia 07/31/2023   COPD (chronic obstructive pulmonary disease) (HCC) 04/03/2023   Hypophosphatemia 11/19/2022   Metabolic acidosis 11/19/2022   Chronic heart failure with preserved ejection fraction (HFpEF) (HCC) 11/18/2022   Paroxysmal atrial fibrillation (HCC) 11/18/2022   Thrombocytopenia 11/18/2022   Polypharmacy 05/17/2022   General weakness  02/16/2022   Poor balance 02/16/2022   Falling 02/16/2022   Hypokalemia 02/13/2022   HTN (hypertension) 02/13/2022   DDD (degenerative disc disease), lumbar 12/26/2021   Atrial fibrillation with RVR (HCC) 10/05/2021   Estrogen deficiency 06/07/2021   Overactive bladder 10/17/2019   Medicare annual wellness visit, subsequent 01/16/2019   Thyroid  nodule 05/01/2018   Electronic cigarette use 04/03/2018   Obesity (BMI 30-39.9) 04/03/2018   Lung mass 02/01/2018   Smokers' cough (HCC) 01/01/2018   Microscopic hematuria 08/13/2012   UTI (urinary tract infection) 07/22/2012   Routine general medical examination at a health care facility 07/22/2012   Leukocytosis 03/30/2010   Prediabetes 02/21/2008   Allergic rhinitis 07/25/2007   Low back pain 06/26/2007   Personal history of goiter 07/27/2006   Hyperlipidemia 07/27/2006   PANIC DISORDER 07/27/2006   Former smoker 07/27/2006   Depression with anxiety 07/27/2006   Insomnia 07/27/2006   History of colonic polyps 06/09/2004    Orientation RESPIRATION BLADDER Height & Weight     Self  O2 (2L [place]) Continent Weight: 73 kg Height:  5' 3 (160 cm)  BEHAVIORAL SYMPTOMS/MOOD NEUROLOGICAL BOWEL NUTRITION STATUS     (none) Continent Diet (regular)  AMBULATORY STATUS COMMUNICATION OF NEEDS Skin   Extensive Assist Verbally PU Stage and Appropriate Care (Rt breast drainige with foam, right pretibial redness with foam, rt buttocks, stage 2, foam dressing. Coccys, stage 2, foam dressing. Left buttocks, stage 1, foam dressing. Right buttocks, unstagable, full thickness loss with slough and foam dressing.)                       Personal Care Assistance Level  of Assistance  Bathing, Dressing, Feeding Bathing Assistance: Maximum assistance Feeding assistance: Maximum assistance Dressing Assistance: Maximum assistance     Functional Limitations Info  Sight, Hearing, Speech Sight Info: Impaired Hearing Info: Adequate Speech Info: Adequate     SPECIAL CARE FACTORS FREQUENCY  PT (By licensed PT), OT (By licensed OT)     PT Frequency: 5x week OT Frequency: 5x week            Contractures Contractures Info: Not present    Additional Factors Info  Code Status, Allergies Code Status Info: DNR Allergies Info: Abilify (Aripiprazole), Ambien  (Zolpidem ), Codeine, Lipitor (Atorvastatin), Vesicare  (Solifenacin ), Wellbutrin  (Bupropion )           Current Medications (03/17/2024):  This is the current hospital active medication list Current Facility-Administered Medications  Medication Dose Route Frequency Provider Last Rate Last Admin   acetaminophen  (TYLENOL ) tablet 650 mg  650 mg Oral Q6H PRN Ponnala, Shruthi, MD       albuterol  (PROVENTIL ) (2.5 MG/3ML) 0.083% nebulizer solution 2.5 mg  2.5 mg Inhalation Q6H PRN Ponnala, Shruthi, MD       budesonide -glycopyrrolate -formoterol  (BREZTRI ) 160-9-4.8 MCG/ACT inhaler 2 puff  2 puff Inhalation BID Ponnala, Shruthi, MD   2 puff at 03/17/24 0945   cefTRIAXone  (ROCEPHIN ) 1 g in sodium chloride  0.9 % 100 mL IVPB  1 g Intravenous Q24H Ponnala, Shruthi, MD 200 mL/hr at 03/16/24 1804 1 g at 03/16/24 1804   donepezil  (ARICEPT ) tablet 5 mg  5 mg Oral Daily Ponnala, Shruthi, MD   5 mg at 03/17/24 0944   DULoxetine  (CYMBALTA ) DR capsule 120 mg  120 mg Oral Daily Ayiku, Bernard, MD       feeding supplement (ENSURE PLUS HIGH PROTEIN) liquid 237 mL  237 mL Oral BID BM Jens Durand, MD   237 mL at 03/17/24 0945   flecainide  (TAMBOCOR ) tablet 100 mg  100 mg Oral BID Ponnala, Shruthi, MD   100 mg at 03/17/24 0944   hydrOXYzine  (ATARAX ) tablet 25 mg  25 mg Oral BID Ponnala, Shruthi, MD   25 mg at 03/17/24 0944   melatonin tablet 10 mg  10 mg Oral QHS Ponnala, Shruthi, MD   10 mg at 03/16/24 2138   oxyCODONE  (Oxy IR/ROXICODONE ) immediate release tablet 2.5 mg  2.5 mg Oral Q4H PRN Ponnala, Shruthi, MD   2.5 mg at 03/16/24 1802   pantoprazole  (PROTONIX ) injection 40 mg  40 mg Intravenous Q12H Ponnala,  Shruthi, MD   40 mg at 03/17/24 0944   rosuvastatin  (CRESTOR ) tablet 10 mg  10 mg Oral Daily Ponnala, Shruthi, MD   10 mg at 03/17/24 0944   sodium chloride  flush (NS) 0.9 % injection 3 mL  3 mL Intravenous Q12H Ponnala, Shruthi, MD   3 mL at 03/17/24 0945     Discharge Medications: Please see discharge summary for a list of discharge medications.  Relevant Imaging Results:  Relevant Lab Results:   Additional Information SSN: [id number]  Shasta DELENA Daring, RN     "

## 2024-03-17 NOTE — Consult Note (Signed)
 "    Chief Complaint: Patient was seen in consultation today for anemia and thrombocytopenia, with consideration for bone marrow biopsy and aspiration.  Referring Provider(s): Dr. Aida Cho, MD; Dr. [name], MD  Supervising Physician: Karalee Beat  Patient Status: [facility] - In-pt  Current Code Status  Limited: Do not attempt resuscitation (DNR) -DNR-LIMITED -Do Not Intubate/DNI - Set by Jerelene Critchley, MD at 03/15/2024 0144 (View report)  Question Answer  If pulseless and not breathing No CPR or chest compressions.  In Pre-Arrest Conditions (Patient Is Breathing and Has A Pulse) Do not intubate. Provide all appropriate non-invasive medical interventions. Avoid ICU transfer unless indicated or required.  Consent: Discussion documented in EHR or advanced directives reviewed      Patient currently has DNR order in place. Discussion with the patient and family regarding wishes.  The DNR order is rescinded during the procedure and the patient consents to the use of any resuscitation procedure needed to treat the clinical events that occur.   History of Present Illness: Anita Harrell is a 74 y.o. female  with PMHx notable for HTN, HLD, chronic HFpEF, paroxysmal A-fib, COPD, OSA, dementia, chronic neck and back pain, depression, generalized anxiety disorder, and others as delineated below.  Per Dr. Layvonne progress note dated today: # Acute on chronic thrombocytopenia. Outpatient workup showed normal B12, folate, iron panel was not consistent with iron deficiency.  Multiple myeloma panel showed IgM lambda MGUS with M protein of 0.3.  LDH was elevated 404.  Negative peripheral blood flow cytometry.  Normal immature platelet fraction-indicating decreased bone marrow production. Inpatient workup showed normal haptoglobin, less likely hemolysis.  CT scan showed no lymphadenopathy. UTI may attribute to further decrease of platelet counts.   # Anemia, acute.  GI blood loss versus  marrow suppression secondary to UTI.  Blood work indicating no hemolysis.   I recommend bone marrow biopsy for further evaluation.  Patient and son agree with bone marrow biopsy and preferred to get it done during this hospitalization.   Interventional Radiology was requested for bone marrow biopsy and aspiration. Patient is scheduled for same in IR tomorrow AM.   Patient is alert and laying in bed, calm. Her son is at her bedside.  Patient is currently complaining of weakness. Though she is alert, she remains confused, worse from baseline, per patient's son's report. ROS cannot be reliably obtained.     Past Medical History:  Diagnosis Date   Allergy    allergic rhinitis   Anxiety    Arrhythmia    atrial fibrillation   Arthritis    Back pain    Chest pain    CHF (congestive heart failure) (HCC)    COPD (chronic obstructive pulmonary disease) (HCC)    Depression    Emphysema of lung (HCC)    Epigastric pain    GERD (gastroesophageal reflux disease)    Goiter    History of tobacco abuse    Hyperglycemia    mild   Hyperlipidemia    Hypertension    Insomnia    Hx of   Labile blood pressure    Panic disorder    History of   Personal history of colonic polyps 06/09/2004   SVD (spontaneous vaginal delivery)    x 2    Past Surgical History:  Procedure Laterality Date   ABDOMINAL HYSTERECTOMY N/A 09/24/2012   Procedure: HYSTERECTOMY ABDOMINAL;  Surgeon: Peggye Gull, MD;  Location: [facility];  Service: Gynecology;  Laterality: N/A;   BREAST  BIOPSY Right 2016   CARDIOVERSION N/A 02/09/2022   Procedure: CARDIOVERSION;  Surgeon: Perla Evalene PARAS, MD;  Location: [facility] OR[facility];  Service: Cardiovascular;  Laterality: N/A;   CARDIOVERSION N/A 03/02/2022   Procedure: CARDIOVERSION;  Surgeon: Perla Evalene PARAS, MD;  Location: [facility] ORS;  Service: Cardiovascular;  Laterality: N/A;   COLONOSCOPY     COLONOSCOPY  2017   ELBOW SURGERY  2004   EYE SURGERY     bilateral lasik    SALPINGOOPHORECTOMY Bilateral 09/24/2012   Procedure: SALPINGO OOPHORECTOMY;  Surgeon: Peggye Gull, MD;  Location: [facility];  Service: Gynecology;  Laterality: Bilateral;   THYROID  SURGERY     B9 massess   WART FULGURATION N/A 09/24/2012   Procedure: FULGURATION VAGINAL WART;  Surgeon: Peggye Gull, MD;  Location: [facility];  Service: Gynecology;  Laterality: N/A;   WISDOM TOOTH EXTRACTION      Allergies: Abilify [aripiprazole], Ambien  [zolpidem ], Codeine, Lipitor [atorvastatin], Vesicare  [solifenacin ], and Wellbutrin  [bupropion ]  Medications: Prior to Admission medications  Medication Sig Start Date End Date Taking? Authorizing Provider  acetaminophen  (TYLENOL ) 500 MG tablet Take 500 mg by mouth in the morning and at bedtime.   Yes [provider]  albuterol  (VENTOLIN  HFA) 108 (90 Base) MCG/ACT inhaler Inhale 2 puffs into the lungs every 6 (six) hours as needed for wheezing or shortness of breath. 05/25/23  Yes Tamea Dedra CROME, MD  apixaban  (ELIQUIS ) 5 MG TABS tablet Take 5 mg by mouth 2 (two) times daily.   Yes [provider]  cyclobenzaprine  (FLEXERIL ) 10 MG tablet TAKE ONE HALF (1/2) TO ONE TABLET BY MOUTH AT BEDTIME AS NEEDED FOR MUSCLE SPASMS 02/26/24  Yes Tower, [name], MD  diazepam  (VALIUM ) 5 MG tablet Take 1 tablet (5 mg total) by mouth in the morning and at bedtime. 12/19/23  Yes Gherghe, Costin M, MD  donepezil  (ARICEPT ) 5 MG tablet Take 5 mg by mouth daily. 12/04/23  Yes [provider]  DULoxetine  (CYMBALTA ) 60 MG capsule Take 120 mg by mouth daily. 04/11/22  Yes [provider]  flecainide  (TAMBOCOR ) 100 MG tablet Take 1 tablet (100 mg total) by mouth 2 (two) times daily. 12/19/22  Yes Gollan, Timothy J, MD  Fluticasone -Umeclidin-Vilant (TRELEGY ELLIPTA ) 100-62.5-25 MCG/ACT AEPB Inhale 1 puff into the lungs daily.   Yes [provider]  furosemide  (LASIX ) 20 MG tablet TAKE ONE TABLET (20 MG TOTAL) BY MOUTH DAILY. 12/05/23  Yes Gollan, Timothy  J, MD  HYDROcodone -acetaminophen  (NORCO/VICODIN) 5-325 MG tablet Take 1 tablet by mouth every 6 (six) hours as needed for severe pain (pain score 7-10) or moderate pain (pain score 4-6). 02/18/24 03/19/24 Yes [provider]  hydrOXYzine  (VISTARIL ) 25 MG capsule Take 25 mg by mouth 2 (two) times daily. 04/11/22  Yes Doles-Johnson, Teah, NP  Melatonin 5 MG CAPS Take 10 mg by mouth at bedtime.   Yes [provider]  metoprolol  succinate (TOPROL -XL) 50 MG 24 hr tablet TAKE ONE TABLET BY MOUTH TWICE A DAY WITH MEALS 01/04/24  Yes Gollan, Timothy J, MD  potassium chloride  SA (KLOR-CON  M) 20 MEQ tablet Take 0.5 tablets (10 mEq total) by mouth 3 (three) times daily. 02/05/24  Yes Wittenborn, Barnie, NP  rosuvastatin  (CRESTOR ) 10 MG tablet TAKE ONE TABLET BY MOUTH ONCE A DAY 10/10/23  Yes Tower, [name], MD  traMADol  (ULTRAM ) 50 MG tablet Take 50-100 mg by mouth every 8 (eight) hours as needed for moderate pain (pain score 4-6).   Yes [provider]  traZODone  (DESYREL )  100 MG tablet Take 200 mg by mouth at bedtime. 03/05/23  Yes [provider]  VITAMIN D PO Take 1 capsule by mouth daily.   Yes [provider]  carbidopa-levodopa (SINEMET IR) 25-100 MG tablet Take 1 tablet by mouth 3 (three) times daily. Patient not taking: Reported on 03/15/2024 01/24/24   [provider]  cephALEXin  (KEFLEX ) 500 MG capsule Take 1 capsule (500 mg total) by mouth 3 (three) times daily. Patient not taking: Reported on 03/15/2024 02/21/24   Tower, [name], MD  lidocaine  (LIDODERM ) 5 % Place 1 patch onto the skin daily. Remove & Discard patch within 12 hours or as directed by MD Patient not taking: Reported on 03/15/2024 12/20/23   Trixie Nilda HERO, MD     Family History  Problem Relation Age of Onset   Hypertension Mother    Stroke Mother    Alcohol abuse Father    Cancer Father        lung CA   Heart disease Father        CHF   Cancer Sister        breast CA   Heart  disease Sister        CHF from chemotx also has defib   Breast cancer Sister    Colon cancer Neg Hx    Esophageal cancer Neg Hx    Rectal cancer Neg Hx    Stomach cancer Neg Hx     Social History   Socioeconomic History   Marital status: Married    Spouse name: Not on file   Number of children: Not on file   Years of education: Not on file   Highest education level: Not on file  Occupational History   Not on file  Tobacco Use   Smoking status: Former    Current packs/day: 0.00    Average packs/day: 1.0 packs/day    Types: E-cigarettes, Cigarettes    Quit date: 2011    Years since quitting: 15.0   Smokeless tobacco: Never  Vaping Use   Vaping status: Former   Quit date: 11/18/2022  Substance and Sexual Activity   Alcohol use: No    Alcohol/week: 0.0 standard drinks of alcohol   Drug use: No   Sexual activity: Yes    Birth control/protection: Post-menopausal  Other Topics Concern   Not on file  Social History Narrative   Lives with husband and 2 pets; dogs.   Social Drivers of Health   Tobacco Use: Medium Risk (03/14/2024)   Patient History    Smoking Tobacco Use: Former    Smokeless Tobacco Use: Never    Passive Exposure: Not on file  Financial Resource Strain: Low Risk  (09/19/2023)   Received from [facility]   Overall Financial Resource Strain (CARDIA)    Difficulty of Paying Living Expenses: Not hard at all  Food Insecurity: No Food Insecurity (03/15/2024)   Epic    Worried About Radiation Protection Practitioner of Food in the Last Year: Never true    Ran Out of Food in the Last Year: Never true  Transportation Needs: No Transportation Needs (03/15/2024)   Epic    Lack of Transportation (Medical): No    Lack of Transportation (Non-Medical): No  Physical Activity: Inactive (07/27/2023)   Exercise Vital Sign    Days of Exercise per Week: 0 days    Minutes of Exercise per Session: 0 min  Stress: No Stress Concern Present (07/27/2023)   [employer] of  Occupational Health -  Occupational Stress Questionnaire    Feeling of Stress : Only a little  Social Connections: Socially Integrated (03/15/2024)   Social Connection and Isolation Panel    Frequency of Communication with Friends and Family: Twice a week    Frequency of Social Gatherings with Friends and Family: Twice a week    Attends Religious Services: More than 4 times per year    Active Member of [employer] or Organizations: Yes    Attends Banker Meetings: 1 to 4 times per year    Marital Status: Married  Recent Concern: Social Connections - Moderately Isolated (12/16/2023)   Social Connection and Isolation Panel    Frequency of Communication with Friends and Family: Three times a week    Frequency of Social Gatherings with Friends and Family: Once a week    Attends Religious Services: Never    Database Administrator or Organizations: No    Attends Banker Meetings: Never    Marital Status: Married  Depression (PHQ2-9): High Risk (02/21/2024)   Depression (PHQ2-9)    PHQ-2 Score: 22  Alcohol Screen: Low Risk (07/27/2023)   Alcohol Screen    Last Alcohol Screening Score (AUDIT): 0  Housing: Low Risk (03/15/2024)   Epic    Unable to Pay for Housing in the Last Year: No    Number of Times Moved in the Last Year: 0    Homeless in the Last Year: No  Utilities: Not At Risk (03/15/2024)   Epic    Threatened with loss of utilities: No  Health Literacy: Adequate Health Literacy (07/27/2023)   B1300 Health Literacy    Frequency of need for help with medical instructions: Never     Review of Systems: A 12 point ROS discussed and pertinent positives are indicated in the HPI above.  All other systems are negative.  Vital Signs: BP (!) 107/59 (BP Location: Left Arm)   Pulse 100   Temp 98.6 F (37 C)   Resp 19   Ht 5' 3 (1.6 m)   Wt 161 lb (73 kg)   SpO2 93%   BMI 28.52 kg/m   Advance Care Plan: The advanced care place/surrogate decision maker was discussed at  the time of visit and the patient did not wish to discuss or was not able to name a surrogate decision maker or provide an advance care plan.  Physical Exam Constitutional:      General: She is not in acute distress.    Appearance: Normal appearance.  HENT:     Mouth/Throat:     Mouth: Mucous membranes are moist.  Cardiovascular:     Rate and Rhythm: Regular rhythm. Tachycardia present.     Pulses: Normal pulses.     Heart sounds: Normal heart sounds.  Pulmonary:     Effort: Pulmonary effort is normal.     Breath sounds: Normal breath sounds.  Abdominal:     General: Abdomen is flat.     Tenderness: There is no abdominal tenderness.  Skin:    General: Skin is warm and dry.  Neurological:     Mental Status: She is alert. She is disoriented.  Psychiatric:        Mood and Affect: Mood normal.        Behavior: Behavior normal.     Imaging: CT CERVICAL SPINE WO CONTRAST Result Date: 03/15/2024 EXAM: CT CERVICAL SPINE WITHOUT CONTRAST 03/15/2024 02:22:42 AM TECHNIQUE: CT of the cervical spine was performed without the administration of intravenous contrast.  Multiplanar reformatted images are provided for review. Automated exposure control, iterative reconstruction, and/or weight based adjustment of the mA/kV was utilized to reduce the radiation dose to as low as reasonably achievable. COMPARISON: None available. CLINICAL HISTORY: neck pain FINDINGS: BONES AND ALIGNMENT: No acute fracture or traumatic malalignment. DEGENERATIVE CHANGES: Mild degenerative disc disease without spinal canal stenosis. Multilevel facet arthrosis. No high-grade foraminal stenosis. SOFT TISSUES: No prevertebral soft tissue swelling. IMPRESSION: 1. Mild degenerative disc disease without spinal canal stenosis. 2. Multilevel facet arthrosis without high-grade foraminal stenosis. Electronically signed by: [name] Stanford MD 03/15/2024 02:31 AM EST RP Workstation: [id number]   CT Angio Chest/Abd/Pel for Dissection W and/or  Wo Contrast Result Date: 03/14/2024 EXAM: CTA CHEST, ABDOMEN AND PELVIS WITH AND WITHOUT CONTRAST 03/14/2024 08:35:25 PM TECHNIQUE: CTA of the chest was performed with and without the administration of intravenous contrast. CTA of the abdomen and pelvis was performed with and without the administration of intravenous contrast. Multiplanar reformatted images are provided for review. MIP images are provided for review. Automated exposure control, iterative reconstruction, and/or weight based adjustment of the mA/kV was utilized to reduce the radiation dose to as low as reasonably achievable. COMPARISON: 12/15/2023. CLINICAL HISTORY: weakness, back pain FINDINGS: VASCULATURE: Coronary artery and aortic atherosclerosis are present. AORTA: Aortic atherosclerosis is present. No acute finding. No abdominal aortic aneurysm. No dissection. PULMONARY ARTERIES: No pulmonary embolism with the limits of this exam. GREAT VESSELS OF AORTIC ARCH: No acute finding. No dissection. No arterial occlusion or significant stenosis. CELIAC TRUNK: No acute finding. No occlusion or significant stenosis. SUPERIOR MESENTERIC ARTERY: No acute finding. No occlusion or significant stenosis. INFERIOR MESENTERIC ARTERY: No acute finding. No occlusion or significant stenosis. RENAL ARTERIES: No acute finding. No occlusion or significant stenosis. ILIAC ARTERIES: No acute finding. No occlusion or significant stenosis. CHEST: MEDIASTINUM: The heart is borderline in size. Coronary artery atherosclerosis is present. No mediastinal lymphadenopathy. The pericardium demonstrates no acute abnormality. LUNGS AND PLEURA: Dependent opacities in the lower lobes bilaterally, left greater than right. Favor atelectasis although pneumonia cannot be excluded in the left lower lobe. No evidence of pleural effusion or pneumothorax. THORACIC BONES AND SOFT TISSUES: Healed posterior right lower rib fractures. These are unchanged since prior study. Mild depression  through the inferior endplate of T6 and superior endplate of T8, new since prior study. ABDOMEN AND PELVIS: LIVER: The liver is unremarkable. GALLBLADDER AND BILE DUCTS: Gallbladder is unremarkable. No biliary ductal dilatation. SPLEEN: The spleen is unremarkable. PANCREAS: The pancreas is unremarkable. ADRENAL GLANDS: Bilateral adrenal glands demonstrate no acute abnormality. KIDNEYS, URETERS AND BLADDER: No stones in the kidneys or ureters. No hydronephrosis. No perinephric or periureteral stranding. Urinary bladder is unremarkable. GI AND BOWEL: Stomach and duodenal sweep demonstrate no acute abnormality. There is no bowel obstruction. No abnormal bowel wall thickening or distension. REPRODUCTIVE: Prior hysterectomy. No adnexal mass. PERITONEUM AND RETROPERITONEUM: No ascites or free air. LYMPH NODES: No lymphadenopathy. ABDOMINAL BONES AND SOFT TISSUES: Right old fracture noted at the superior and inferior left pubic rami and left sacrum. Chronic stable mild compression fracture at L5. No acute soft tissue abnormality. IMPRESSION: 1. Coronary artery, aortic atherosclerosis. No evidence of aneurysm or dissection. 2. New vertebral endplate compression fractures at T6 and T8 since prior study. 3. Dependent lower lobe opacities, left greater than right, favored atelectasis, although left lower lobe pneumonia is not excluded. Electronically signed by: [name] Crease MD 03/14/2024 09:29 PM EST RP Workstation: [id number]   DG Chest Portable 1 View Result Date: 03/14/2024  EXAM: 1 VIEW(S) XRAY OF THE CHEST 03/14/2024 07:31:00 PM COMPARISON: Comparison with 12/14/2023. CLINICAL HISTORY: weakness FINDINGS: LUNGS AND PLEURA: Shallow inspiration. Infiltration or atelectasis in the left base is new since the prior study. Probable small left pleural effusion. No pneumothorax. HEART AND MEDIASTINUM: No acute abnormality of the cardiac and mediastinal silhouettes. BONES AND SOFT TISSUES: Postoperative changes in the base of the  neck. Degenerative changes in the spine and shoulders. No acute osseous abnormality. IMPRESSION: 1. New left basilar infiltration or atelectasis and probable small left pleural effusion. Electronically signed by: Elsie Gravely MD 03/14/2024 07:42 PM EST RP Workstation: [id number]    Labs:  CBC: Recent Labs    03/14/24 1621 03/15/24 0823 03/16/24 0425 03/17/24 0419  WBC 11.3* 10.0 9.6 9.2  HGB 9.8* 8.6* 9.1* 8.9*  HCT 30.4* 27.2* 28.1* 27.2*  PLT 43* 35* 32* 29*    COAGS: Recent Labs    12/14/23 2359 03/14/24 1621  INR 1.5* 1.5*    BMP: Recent Labs    03/14/24 1621 03/15/24 0823 03/16/24 0425 03/17/24 0419  NA 139 139 140 140  K 3.7 3.3* 3.6 3.5  CL 103 104 104 104  CO2 26 27 25 27   GLUCOSE 73 97 109* 87  BUN 9 6* 5* 8  CALCIUM  9.8 9.8 10.1 10.9*  CREATININE 0.57 0.48 0.46 0.57  GFRNONAA >60 >60 >60 >60    LIVER FUNCTION TESTS: Recent Labs    07/24/23 0944 12/14/23 2359 02/19/24 1552 03/14/24 1621  BILITOT 0.5 0.9 0.5 0.5  AST 20 25 25  40  ALT 14 28 12 10   ALKPHOS 116 75 188* 119  PROT 6.2 6.2* 6.6 5.0*  ALBUMIN  3.7 3.3* 3.3* 2.5*    TUMOR MARKERS: No results for input(s): AFPTM, CEA, CA199, CHROMGRNA in the last 8760 hours.  Assessment and Plan: Per Dr. Layvonne progress note dated today: # Acute on chronic thrombocytopenia. Outpatient workup showed normal B12, folate, iron panel was not consistent with iron deficiency.  Multiple myeloma panel showed IgM lambda MGUS with M protein of 0.3.  LDH was elevated 404.  Negative peripheral blood flow cytometry.  Normal immature platelet fraction-indicating decreased bone marrow production. Inpatient workup showed normal haptoglobin, less likely hemolysis.  CT scan showed no lymphadenopathy. UTI may attribute to further decrease of platelet counts.   # Anemia, acute.  GI blood loss versus marrow suppression secondary to UTI.  Blood work indicating no hemolysis.   I recommend bone marrow biopsy for  further evaluation.  Patient and son agree with bone marrow biopsy and preferred to get it done during this hospitalization.  Patient will present for scheduled bone marrow biopsy and aspiration in IR tomorrow in AM.  Patient will be made NPO at midnight in anticipation of moderate sedation. All labs and medications are within acceptable parameters.  Hgb 8.9, Plt 29K. Allergies reviewed: Abilify; Ambien ; codeine (N/V); Lipitor; Vesicare ; Wellbutrin .  Risks and benefits of bone marrow biopsy and aspiration was discussed with the patient and/or patient's family including, but not limited to bleeding, infection, damage to adjacent structures or low yield requiring additional tests.  All of the questions were answered and there is agreement to proceed.  Consent signed and in chart.     Thank you for allowing our service to participate in Anita Harrell 's care.  Electronically Signed: Carlin DELENA Griffon, PA-C   03/17/2024, 11:04 PM      I spent a total of 40 Minutes in face to face in clinical consultation,  greater than 50% of which was counseling/coordinating care for anemia and thrombocytopenia, with consideration for bone marrow biopsy and aspiration.  "

## 2024-03-18 ENCOUNTER — Inpatient Hospital Stay: Admitting: Radiology

## 2024-03-18 HISTORY — PX: IR BONE MARROW BIOPSY & ASPIRATION: IMG5727

## 2024-03-18 LAB — GLUCOSE, CAPILLARY
Glucose-Capillary: 90 mg/dL (ref 70–99)
Glucose-Capillary: 85 mg/dL (ref 70–99)
Glucose-Capillary: 94 mg/dL (ref 70–99)
Glucose-Capillary: 107 mg/dL — ABNORMAL HIGH (ref 70–99)
Glucose-Capillary: 105 mg/dL — ABNORMAL HIGH (ref 70–99)

## 2024-03-18 LAB — RENAL FUNCTION PANEL
Albumin: 2.4 g/dL — ABNORMAL LOW (ref 3.5–5.0)
Anion gap: 9 (ref 5–15)
BUN: 8 mg/dL (ref 8–23)
CO2: 28 mmol/L (ref 22–32)
Calcium: 10.5 mg/dL — ABNORMAL HIGH (ref 8.9–10.3)
Chloride: 100 mmol/L (ref 98–111)
Creatinine, Ser: 0.53 mg/dL (ref 0.44–1.00)
GFR, Estimated: >60 mL/min
Glucose, Bld: 91 mg/dL (ref 70–99)
Phosphorus: 4.4 mg/dL (ref 2.5–4.6)
Potassium: 2.9 mmol/L — ABNORMAL LOW (ref 3.5–5.1)
Sodium: 137 mmol/L (ref 135–145)

## 2024-03-18 LAB — CULTURE, BLOOD (ROUTINE X 2)

## 2024-03-18 LAB — CBC
HCT: 26 % — ABNORMAL LOW (ref 36.0–46.0)
Hemoglobin: 8.6 g/dL — ABNORMAL LOW (ref 12.0–15.0)
MCH: 30 pg (ref 26.0–34.0)
MCHC: 33.1 g/dL (ref 30.0–36.0)
MCV: 90.6 fL (ref 80.0–100.0)
Platelets: 27 K/uL — CL (ref 150–400)
RBC: 2.87 MIL/uL — ABNORMAL LOW (ref 3.87–5.11)
RDW: 16.3 % — ABNORMAL HIGH (ref 11.5–15.5)
WBC: 7.9 K/uL (ref 4.0–10.5)
nRBC: 0.5 % — ABNORMAL HIGH (ref 0.0–0.2)

## 2024-03-18 LAB — TYPE AND SCREEN
ABO/RH(D): A NEG
Antibody Screen: NEGATIVE

## 2024-03-18 LAB — MAGNESIUM: Magnesium: 1.9 mg/dL (ref 1.7–2.4)

## 2024-03-18 MED ORDER — HEPARIN SOD (PORK) LOCK FLUSH 100 UNIT/ML IV SOLN
INTRAVENOUS | Status: AC
  Filled 2024-03-18: qty 5

## 2024-03-18 MED ORDER — LIDOCAINE HCL 1 % IJ SOLN
10.0000 mL | Freq: Once | INTRAMUSCULAR | Status: AC
  Administered 2024-03-18: 10 mL via INTRADERMAL
  Filled 2024-03-18: qty 10

## 2024-03-18 MED ORDER — POTASSIUM CHLORIDE CRYS ER 20 MEQ PO TBCR
40.0000 meq | EXTENDED_RELEASE_TABLET | ORAL | Status: AC
  Administered 2024-03-18 (×2): 40 meq via ORAL
  Filled 2024-03-18 (×2): qty 2

## 2024-03-18 MED ORDER — METOPROLOL SUCCINATE ER 50 MG PO TB24
50.0000 mg | ORAL_TABLET | Freq: Every day | ORAL | Status: DC
  Administered 2024-03-18 – 2024-03-26 (×9): 50 mg via ORAL
  Filled 2024-03-18 (×9): qty 1

## 2024-03-18 MED ORDER — AMOXICILLIN 500 MG PO CAPS
500.0000 mg | ORAL_CAPSULE | Freq: Three times a day (TID) | ORAL | Status: AC
  Administered 2024-03-18 – 2024-03-19 (×5): 500 mg via ORAL
  Filled 2024-03-18 (×6): qty 1

## 2024-03-18 MED ORDER — LIDOCAINE 1 % OPTIME INJ - NO CHARGE
5.0000 mL | Freq: Once | INTRAMUSCULAR | Status: AC
  Administered 2024-03-18: 5 mL via INTRADERMAL
  Filled 2024-03-18: qty 6

## 2024-03-18 MED ORDER — FENTANYL CITRATE (PF) 100 MCG/2ML IJ SOLN
INTRAMUSCULAR | Status: AC | PRN
  Administered 2024-03-18 (×2): 25 ug via INTRAVENOUS

## 2024-03-18 MED ORDER — MIDAZOLAM HCL (PF) 2 MG/2ML IJ SOLN
INTRAMUSCULAR | Status: AC | PRN
  Administered 2024-03-18 (×2): .5 mg via INTRAVENOUS

## 2024-03-18 MED ORDER — FENTANYL CITRATE (PF) 100 MCG/2ML IJ SOLN
INTRAMUSCULAR | Status: AC
  Filled 2024-03-18: qty 2

## 2024-03-18 MED ORDER — LIDOCAINE HCL 1 % IJ SOLN
INTRAMUSCULAR | Status: AC
  Filled 2024-03-18: qty 20

## 2024-03-18 MED ORDER — MIDAZOLAM HCL 2 MG/2ML IJ SOLN
INTRAMUSCULAR | Status: AC
  Filled 2024-03-18: qty 2

## 2024-03-18 NOTE — Progress Notes (Signed)
 Patient clinically stable post IR BMB per Dr Philip, tolerated well. Vitals stable post procedure/awake/alert.  Received Versed  1 mg along with Fentanyl  50 mcg IV for procedure. Report given to [facility] RN post procedure/specials/20

## 2024-03-18 NOTE — Progress Notes (Signed)
 Occupational Therapy Treatment Patient Details Name: Anita Harrell MRN: [id number] DOB: 15-Aug-1950 Today's Date: 03/18/2024   History of present illness Patient is a 74 year old female with weakness, confusion, neck and back pain. Found to have UTI, T6 and T8 compression fracture. PMH: HFpEF, paroxysmal A-fib, HLD, dementia, depression, and generalized anxiety disorder, chronic neck and back pain, OSA.   OT comments  Upon entering the room, pt supine in bed and agreeable to OT and PT co-treatment. Pt needing +2 assistance for bed mobility. She is able to maintain static sitting balance for ~ 7 minutes to comb hair and wash face with close supervision for balance. No lateral lean noted this session and pt remains at midline. Pt stands with +2 assist and takes 2-3 side steps to the L with hand held assistance before returning to seated position on bed. +2 assist to return to supine. Pt making excellent progress towards goals this session.       If plan is discharge home, recommend the following:  A lot of help with bathing/dressing/bathroom;A lot of help with walking and/or transfers   Equipment Recommendations  Other (comment) (defer)       Precautions / Restrictions Precautions Precautions: Fall Recall of Precautions/Restrictions: Impaired Precaution/Restrictions Comments: chronic back pain with prior fractures       Mobility Bed Mobility Overal bed mobility: Needs Assistance Bed Mobility: Supine to Sit, Sit to Supine     Supine to sit: Mod assist, +2 for physical assistance Sit to supine: +2 for physical assistance, Mod assist        Transfers Overall transfer level: Needs assistance Equipment used: 2 person hand held assist Transfers: Sit to/from Stand Sit to Stand: Min assist, Mod assist, +2 physical assistance                 Balance Overall balance assessment: Needs assistance Sitting-balance support: Feet supported, Single extremity supported Sitting  balance-Leahy Scale: Good                                     ADL either performed or assessed with clinical judgement   ADL       Grooming: Wash/dry face;Sitting;Supervision/safety;Set up;Wash/dry hands;Brushing hair                                      Extremity/Trunk Assessment              Vision       Perception     Praxis     Communication Communication Communication: No apparent difficulties   Cognition Arousal: Alert Behavior During Therapy: WFL for tasks assessed/performed Cognition: History of cognitive impairments                               Following commands: Impaired Following commands impaired: Follows one step commands inconsistently, Follows one step commands with increased time      Cueing   Cueing Techniques: Verbal cues, Gestural cues, Tactile cues  Exercises      Shoulder Instructions       General Comments      Pertinent Vitals/ Pain       Pain Assessment Pain Assessment: Faces Faces Pain Scale: Hurts a little bit Pain Location: neck/back Pain Descriptors / Indicators: Discomfort Pain  Intervention(s): Monitored during session  Home Living                                          Prior Functioning/Environment              Frequency  Min 2X/week        Progress Toward Goals  OT Goals(current goals can now be found in the care plan section)  Progress towards OT goals: Progressing toward goals     Plan      Co-evaluation    PT/OT/SLP Co-Evaluation/Treatment: Yes Reason for Co-Treatment: For patient/therapist safety;Necessary to address cognition/behavior during functional activity;To address functional/ADL transfers PT goals addressed during session: Mobility/safety with mobility;Balance OT goals addressed during session: ADL's and self-care      AM-PAC OT 6 Clicks Daily Activity     Outcome Measure   Help from another person eating meals?:  A Little Help from another person taking care of personal grooming?: A Little Help from another person toileting, which includes using toliet, bedpan, or urinal?: A Lot Help from another person bathing (including washing, rinsing, drying)?: A Lot Help from another person to put on and taking off regular upper body clothing?: A Lot Help from another person to put on and taking off regular lower body clothing?: A Lot 6 Click Score: 14    End of Session    OT Visit Diagnosis: Other abnormalities of gait and mobility (R26.89);Muscle weakness (generalized) (M62.81);Pain   Activity Tolerance Patient tolerated treatment well   Patient Left in bed;with call bell/phone within reach;with family/visitor present   Nurse Communication Mobility status        Time: 8940-8877 OT Time Calculation (min): 23 min  Charges: OT General Charges $OT Visit: 1 Visit OT Treatments $Self Care/Home Management : 8-22 mins  Izetta Claude, MS, OTR/L , CBIS ascom [phone]  03/18/2024, 11:54 AM

## 2024-03-18 NOTE — Progress Notes (Signed)
 "    Progress Note    Anita Harrell  FMW:[id number] DOB: 01-10-1951  DOA: 03/14/2024 PCP: Randeen [name], MD      Brief Narrative:    Medical records reviewed and are as summarized below:  Anita Harrell is a 74 y.o. female  with medical history significant of chronic HFpEF, paroxysmal A-fib, HLD, dementia, depression, and generalized anxiety disorder, chronic neck and back pain, OSA, who presented to the ED because of general weakness, increasing confusion, neck and back pain.  He has chronic neck and back pain/lumbar radiculopathy and recently got steroid injection on 03/03/2024.  Since discharge from the rehab facility about a week before Thanksgiving, patient's condition has declined.  She has gotten weaker and weaker and she has been intermittently confused and confusion is typically worse in the evenings.  Husband and family has become increasingly difficult to take care of her at home.  She is to be able to ambulate with a walker.  She is now unable to ambulate.  Recently got 2 rounds of antibiotics for UTI.  She was prescribed 5 days of nitrofurantoin by her neurologist on 01/24/2024.  She was given second round of antibiotics with Keflex  on 02/21/2024.  Urine culture from 02/21/2024 showed pansensitive E. coli.   Vitals in the ED: Temperature 97.4, respiratory rate 12, BP 104/38, oxygen  saturation 99% on 2 L oxygen .   CT angio chest/abdomen and pelvis IMPRESSION: 1. Coronary artery, aortic atherosclerosis. No evidence of aneurysm or dissection. 2. New vertebral endplate compression fractures at T6 and T8 since prior study. 3. Dependent lower lobe opacities, left greater than right, favored atelectasis, although left lower lobe pneumonia is not excluded.    CT cervical spine IMPRESSION: 1. Mild degenerative disc disease without spinal canal stenosis. 2. Multilevel facet arthrosis without high-grade foraminal stenosis.     Assessment/Plan:   Principal Problem:    UTI (urinary tract infection) Active Problems:   Thrombocytopenia   Other pancytopenia (HCC)   Pressure injury of skin   Compression fracture of body of thoracic vertebra (HCC)   Occult blood in stools    Body mass index is 28.52 kg/m.  Acute UTI: Urine culture showed E. coli.  Change IV ceftriaxone  to amoxicillin  with plan to treat for total of 5 days.  Recently treated for UTI on 2 occasions. She was given a prescription for Keflex  for UTI and right breast wound on 02/21/2024.  Urine culture from 02/21/2024 showed pansensitive E. coli. Patient was given a prescription for nitrofurantoin for 5 days on 01/24/2024 when she saw Dr. Maree, neurologist, in the office.  Staph epidermidis bacteremia: 1 out of 2 bottles positive for Staph epidermidis.  This is likely a contaminant.   Dependent lower lobe opacities: Unable to exclude pneumonia.  No respiratory symptoms at this time.  She uses 2 L oxygen  at home as needed for chronic hypoxic respiratory failure.   Hypotension: BP improved with IV fluids.   Positive heme stools, acute anemia: H&H stable.  Hemoglobin trend 9.8-8.6-9.1.  Hemoglobin was 12.9 on 02/19/2024.  Unknown if this is due to GI blood loss.  Continue IV Protonix .  Eliquis  has been held.  Case discussed with Dr. Unk, gastroenterologist on-call, on 03/16/2023. No plan for inpatient endoscopic workup.  Outpatient follow-up recommended. She has no plan for endoscopic evaluation at this time given significant thrombocytopenia and lack of overt bleeding and absence of iron deficiency anemia. Patient can follow-up with gastroenterologist as an outpatient. Iron studies inconsistent with iron  deficiency anemia: Iron 43, TIBC 135, saturation ratio 32, ferritin 358  Acute on chronic thrombocytopenia: Platelet down from 43 to 35-32-29-27. Platelet was 112 on 02/19/2024. S/p bone marrow biopsy by IR on 03/18/2024 Follow-up with Dr. Babara, hematologist.   T6 and T8 fracture: Patient  said she is not in any pain.She has been evaluated by Dr. Claudene, neurosurgeon.  He recommended conservative management and TLSO brace for comfort.  However, patient declines TLSO brace. Chronic neck and back pain, chronic lumbar radiculopathy and greater trochanteric bursitis of left hip: Recent steroid injection for lumbar radiculopathy on 03/03/2024.   General Weakness: PT and OT recommended discharge to SNF   Hypokalemia: Potassium down again to 2.9.  Replete potassium and monitor levels.   Dementia: Continue supportive care. He sees Dr. Maree, neurologist, as an outpatient.   S/p hypoglycemia: Improved    Chronic HFpEF: Compensated. proBNP 1237.   Paroxysmal atrial fibrillation: Continue flecainide .  Restart metoprolol . Eliquis  remains on hold because of significant thrombocytopenia.     Stage II right buttock, stage II right coccyx, unstageable right buttock and stage I left buttock decubitus ulcers, right breast ulcer: These were all present on admission.  Continue local wound care.   Comorbidities include depression, generalized anxiety disorder, solitary right thyroid  nodule   Diet Order             Diet NPO time specified Except for: Sips with Meds  Diet effective midnight                                  Consultants: Neurosurgeon Gastroenterology  Procedures: None    Medications:    amoxicillin   500 mg Oral Q8H   budesonide -glycopyrrolate -formoterol   2 puff Inhalation BID   donepezil   5 mg Oral Daily   DULoxetine   120 mg Oral Daily   feeding supplement  237 mL Oral BID BM   flecainide   100 mg Oral BID   hydrOXYzine   25 mg Oral BID   melatonin  10 mg Oral QHS   metoprolol  succinate  50 mg Oral Daily   pantoprazole  (PROTONIX ) IV  40 mg Intravenous Q12H   rosuvastatin   10 mg Oral Daily   sodium chloride  flush  3 mL Intravenous Q12H   Continuous Infusions:     Anti-infectives (From admission, onward)    Start     Dose/Rate  Route Frequency Ordered Stop   03/18/24 1400  amoxicillin  (AMOXIL ) capsule 500 mg        500 mg Oral Every 8 hours 03/18/24 1128 03/20/24 0559   03/15/24 1730  cefTRIAXone  (ROCEPHIN ) 1 g in sodium chloride  0.9 % 100 mL IVPB  Status:  Discontinued        1 g 200 mL/hr over 30 Minutes Intravenous Every 24 hours 03/15/24 0147 03/18/24 1128   03/14/24 2300  azithromycin  (ZITHROMAX ) 500 mg in sodium chloride  0.9 % 250 mL IVPB        500 mg 250 mL/hr over 60 Minutes Intravenous  Once 03/14/24 2255 03/15/24 0021   03/14/24 1730  cefTRIAXone  (ROCEPHIN ) 1 g in sodium chloride  0.9 % 100 mL IVPB        1 g 200 mL/hr over 30 Minutes Intravenous  Once 03/14/24 1720 03/14/24 1758              Family Communication/Anticipated D/C date and plan/Code Status   DVT prophylaxis: SCDs Start: 03/15/24 0143     Code  Status: Limited: Do not attempt resuscitation (DNR) -DNR-LIMITED -Do Not Intubate/DNI   Family Communication: Plan discussed with Dasie, son, at the bedside Disposition Plan: May need to be discharged to SNF   Status is: Inpatient Remains inpatient appropriate because: General weakness       Subjective:   Interval events noted.  No complaints.  She says she wants to go home.  Dasie, son, at the bedside  Objective:    Vitals:   03/18/24 0915 03/18/24 0930 03/18/24 0952 03/18/24 1234  BP: 117/61 (!) 110/57 111/62 (!) 109/47  Pulse: (!) 107 (!) 105 99 100  Resp: 20 20 16 20   Temp:  98 F (36.7 C) 98 F (36.7 C) 98.9 F (37.2 C)  TempSrc:  Temporal    SpO2: 93% 91% 98% 100%  Weight:      Height:       No data found.    Intake/Output Summary (Last 24 hours) at 03/18/2024 1341 Last data filed at 03/18/2024 0600 Gross per 24 hour  Intake 243 ml  Output 900 ml  Net -657 ml   Filed Weights   03/14/24 1626  Weight: 73 kg    Exam:  GEN: NAD SKIN: Warm and dry.  Multiple bruises on upper and lower extremities EYES: No pallor or icterus ENT: MMM CV: RRR PULM:  CTA B ABD: soft, ND, NT, +BS CNS: AAO x 1 (person), non focal EXT: No edema or tenderness       Wound 03/15/24 1308 Pressure Injury Buttocks Right Stage 2 -  Partial thickness loss of dermis presenting as a shallow open injury with a red, pink wound bed without slough. (Active)     Wound 03/15/24 1309 Pressure Injury Coccyx Right Stage 2 -  Partial thickness loss of dermis presenting as a shallow open injury with a red, pink wound bed without slough. (Active)     Wound 03/15/24 1310 Pressure Injury Buttocks Left Stage 1 -  Intact skin with non-blanchable redness of a localized area usually over a bony prominence. (Active)     Wound 03/15/24 1413 Pressure Injury Buttocks Right Unstageable - Full thickness tissue loss in which the base of the injury is covered by slough (yellow, tan, gray, green or brown) and/or eschar (tan, brown or black) in the wound bed. (Active)     Data Reviewed:   I have personally reviewed following labs and imaging studies:  Labs: Labs show the following:   Basic Metabolic Panel: Recent Labs  Lab 03/14/24 1621 03/15/24 0823 03/16/24 0425 03/17/24 0419 03/18/24 0346  NA 139 139 140 140 137  K 3.7 3.3* 3.6 3.5 2.9*  CL 103 104 104 104 100  CO2 26 27 25 27 28   GLUCOSE 73 97 109* 87 91  BUN 9 6* 5* 8 8  CREATININE 0.57 0.48 0.46 0.57 0.53  CALCIUM  9.8 9.8 10.1 10.9* 10.5*  MG  --   --  1.8  --  1.9  PHOS  --   --   --   --  4.4   GFR Estimated Creatinine Clearance: 58.1 mL/min (by C-G formula based on SCr of 0.53 mg/dL). Liver Function Tests: Recent Labs  Lab 03/14/24 1621 03/18/24 0346  AST 40  --   ALT 10  --   ALKPHOS 119  --   BILITOT 0.5  --   PROT 5.0*  --   ALBUMIN  2.5* 2.4*   No results for input(s): LIPASE, AMYLASE in the last 168 hours. No results for input(s):  AMMONIA in the last 168 hours. Coagulation profile Recent Labs  Lab 03/14/24 1621  INR 1.5*    CBC: Recent Labs  Lab 03/14/24 1621 03/15/24 0823  03/16/24 0425 03/17/24 0419 03/18/24 0346  WBC 11.3* 10.0 9.6 9.2 7.9  HGB 9.8* 8.6* 9.1* 8.9* 8.6*  HCT 30.4* 27.2* 28.1* 27.2* 26.0*  MCV 91.3 92.2 90.9 90.7 90.6  PLT 43* 35* 32* 29* 27*   Cardiac Enzymes: No results for input(s): CKTOTAL, CKMB, CKMBINDEX, TROPONINI in the last 168 hours. BNP (last 3 results) Recent Labs    03/15/24 0823  PROBNP 1,237.0*   CBG: Recent Labs  Lab 03/17/24 2341 03/18/24 0357 03/18/24 0548 03/18/24 0954 03/18/24 1242  GLUCAP 95 90 85 94 107*   D-Dimer: No results for input(s): DDIMER in the last 72 hours. Hgb A1c: No results for input(s): HGBA1C in the last 72 hours. Lipid Profile: No results for input(s): CHOL, HDL, LDLCALC, TRIG, CHOLHDL, LDLDIRECT in the last 72 hours. Thyroid  function studies: No results for input(s): TSH, T4TOTAL, T3FREE, THYROIDAB in the last 72 hours.  Invalid input(s): FREET3 Anemia work up: Recent Labs    03/16/24 0425 03/17/24 0419  RETICCTPCT 2.3 2.2   Sepsis Labs: Recent Labs  Lab 03/14/24 1927 03/15/24 0823 03/16/24 0425 03/17/24 0419 03/18/24 0346  WBC  --  10.0 9.6 9.2 7.9  LATICACIDVEN 1.8  --   --   --   --     Microbiology Recent Results (from the past 240 hours)  Urine Culture     Status: Abnormal   Collection Time: 03/14/24  4:21 PM   Specimen: Urine, Clean Catch  Result Value Ref Range Status   Specimen Description   Final    URINE, CLEAN CATCH Performed at [facility], [address] [place], [place] [address]    Special Requests   Final    NONE Performed at [facility], [address], [place], [place] [address]    Culture >=100,000 COLONIES/mL ESCHERICHIA COLI (A)  Final   Report Status 03/17/2024 FINAL  Final   Organism ID, Bacteria ESCHERICHIA COLI (A)  Final      Susceptibility   Escherichia coli - MIC*    AMPICILLIN <=2 SENSITIVE Sensitive     CEFAZOLIN  (URINE) Value in next row Sensitive      <=1  SENSITIVEThis is a modified FDA-approved test that has been validated and its performance characteristics determined by the reporting laboratory.  This laboratory is certified under the Clinical Laboratory Improvement Amendments CLIA as qualified to perform high complexity clinical laboratory testing.    CEFEPIME Value in next row Sensitive      <=1 SENSITIVEThis is a modified FDA-approved test that has been validated and its performance characteristics determined by the reporting laboratory.  This laboratory is certified under the Clinical Laboratory Improvement Amendments CLIA as qualified to perform high complexity clinical laboratory testing.    ERTAPENEM Value in next row Sensitive      <=1 SENSITIVEThis is a modified FDA-approved test that has been validated and its performance characteristics determined by the reporting laboratory.  This laboratory is certified under the Clinical Laboratory Improvement Amendments CLIA as qualified to perform high complexity clinical laboratory testing.    CEFTRIAXONE  Value in next row Sensitive      <=1 SENSITIVEThis is a modified FDA-approved test that has been validated and its performance characteristics determined by the reporting laboratory.  This laboratory is certified under the Clinical Laboratory Improvement Amendments CLIA as qualified to  perform high complexity clinical laboratory testing.    CIPROFLOXACIN  Value in next row Sensitive      <=1 SENSITIVEThis is a modified FDA-approved test that has been validated and its performance characteristics determined by the reporting laboratory.  This laboratory is certified under the Clinical Laboratory Improvement Amendments CLIA as qualified to perform high complexity clinical laboratory testing.    GENTAMICIN Value in next row Sensitive      <=1 SENSITIVEThis is a modified FDA-approved test that has been validated and its performance characteristics determined by the reporting laboratory.  This laboratory is  certified under the Clinical Laboratory Improvement Amendments CLIA as qualified to perform high complexity clinical laboratory testing.    NITROFURANTOIN Value in next row Sensitive      <=1 SENSITIVEThis is a modified FDA-approved test that has been validated and its performance characteristics determined by the reporting laboratory.  This laboratory is certified under the Clinical Laboratory Improvement Amendments CLIA as qualified to perform high complexity clinical laboratory testing.    TRIMETH /SULFA  Value in next row Sensitive      <=1 SENSITIVEThis is a modified FDA-approved test that has been validated and its performance characteristics determined by the reporting laboratory.  This laboratory is certified under the Clinical Laboratory Improvement Amendments CLIA as qualified to perform high complexity clinical laboratory testing.    AMPICILLIN/SULBACTAM Value in next row Sensitive      <=1 SENSITIVEThis is a modified FDA-approved test that has been validated and its performance characteristics determined by the reporting laboratory.  This laboratory is certified under the Clinical Laboratory Improvement Amendments CLIA as qualified to perform high complexity clinical laboratory testing.    PIP/TAZO Value in next row Sensitive      <=4 SENSITIVEThis is a modified FDA-approved test that has been validated and its performance characteristics determined by the reporting laboratory.  This laboratory is certified under the Clinical Laboratory Improvement Amendments CLIA as qualified to perform high complexity clinical laboratory testing.    MEROPENEM Value in next row Sensitive      <=4 SENSITIVEThis is a modified FDA-approved test that has been validated and its performance characteristics determined by the reporting laboratory.  This laboratory is certified under the Clinical Laboratory Improvement Amendments CLIA as qualified to perform high complexity clinical laboratory testing.    * >=100,000  COLONIES/mL ESCHERICHIA COLI  Blood culture (routine x 2)     Status: Abnormal   Collection Time: 03/14/24  7:14 PM   Specimen: BLOOD  Result Value Ref Range Status   Specimen Description   Final    BLOOD LEFT ANTECUBITAL Performed at [facility], [address] [place], [place] [address]    Special Requests   Final    BOTTLES DRAWN AEROBIC AND ANAEROBIC Blood Culture results may not be optimal due to an inadequate volume of blood received in culture bottles Performed at [facility], [address], [place], [place] [address]    Culture  Setup Time   Final    GRAM POSITIVE COCCI AEROBIC BOTTLE ONLY Organism ID to follow CRITICAL RESULT CALLED TO, READ BACK BY AND VERIFIED WITH: JASON ROBBINS @ 03/15/2024 2140 AB Performed at [facility], [address] [place], [place] [address]    Culture (A)  Final    STAPHYLOCOCCUS EPIDERMIDIS THE SIGNIFICANCE OF ISOLATING THIS ORGANISM FROM A SINGLE VENIPUNCTURE CANNOT BE PREDICTED WITHOUT FURTHER CLINICAL AND CULTURE CORRELATION. SUSCEPTIBILITIES AVAILABLE ONLY ON REQUEST. Performed at [facility], [address], [place],  [place] [address]    Report Status 03/18/2024 FINAL  Final  Blood Culture ID Panel (Reflexed)     Status: Abnormal   Collection Time: 03/14/24  7:14 PM  Result Value Ref Range Status   Enterococcus faecalis NOT DETECTED NOT DETECTED Final   Enterococcus Faecium NOT DETECTED NOT DETECTED Final   Listeria monocytogenes NOT DETECTED NOT DETECTED Final   Staphylococcus species DETECTED (A) NOT DETECTED Final    Comment: CRITICAL RESULT CALLED TO, READ BACK BY AND VERIFIED WITH: JASON ROBBINS @ 03/15/2024 2140 AB    Staphylococcus aureus (BCID) NOT DETECTED NOT DETECTED Final   Staphylococcus epidermidis DETECTED (A) NOT DETECTED Final    Comment: Methicillin (oxacillin) resistant coagulase negative staphylococcus. Possible blood culture contaminant (unless isolated from more than one  blood culture draw or clinical case suggests pathogenicity). No antibiotic treatment is indicated for blood  culture contaminants. CRITICAL RESULT CALLED TO, READ BACK BY AND VERIFIED WITH: JASON ROBBINS @ 03/15/2024 2140 AB    Staphylococcus lugdunensis NOT DETECTED NOT DETECTED Final   Streptococcus species NOT DETECTED NOT DETECTED Final   Streptococcus agalactiae NOT DETECTED NOT DETECTED Final   Streptococcus pneumoniae NOT DETECTED NOT DETECTED Final   Streptococcus pyogenes NOT DETECTED NOT DETECTED Final   A.calcoaceticus-baumannii NOT DETECTED NOT DETECTED Final   Bacteroides fragilis NOT DETECTED NOT DETECTED Final   Enterobacterales NOT DETECTED NOT DETECTED Final   Enterobacter cloacae complex NOT DETECTED NOT DETECTED Final   Escherichia coli NOT DETECTED NOT DETECTED Final   Klebsiella aerogenes NOT DETECTED NOT DETECTED Final   Klebsiella oxytoca NOT DETECTED NOT DETECTED Final   Klebsiella pneumoniae NOT DETECTED NOT DETECTED Final   Proteus species NOT DETECTED NOT DETECTED Final   Salmonella species NOT DETECTED NOT DETECTED Final   Serratia marcescens NOT DETECTED NOT DETECTED Final   Haemophilus influenzae NOT DETECTED NOT DETECTED Final   Neisseria meningitidis NOT DETECTED NOT DETECTED Final   Pseudomonas aeruginosa NOT DETECTED NOT DETECTED Final   Stenotrophomonas maltophilia NOT DETECTED NOT DETECTED Final   Candida albicans NOT DETECTED NOT DETECTED Final   Candida auris NOT DETECTED NOT DETECTED Final   Candida glabrata NOT DETECTED NOT DETECTED Final   Candida krusei NOT DETECTED NOT DETECTED Final   Candida parapsilosis NOT DETECTED NOT DETECTED Final   Candida tropicalis NOT DETECTED NOT DETECTED Final   Cryptococcus neoformans/gattii NOT DETECTED NOT DETECTED Final   Methicillin resistance mecA/C DETECTED (A) NOT DETECTED Final    Comment: CRITICAL RESULT CALLED TO, READ BACK BY AND VERIFIED WITH: JASON ROBBINS @ 03/15/2024 2140 AB Performed at  [facility], [address] [place], [place] [address]     Procedures and diagnostic studies:  IR BONE MARROW BIOPSY & ASPIRATION Result Date: 03/18/2024 INDICATION: 74 year old with anemia and thrombocytopenia. EXAM: FLUOROSCOPIC GUIDED BONE MARROW ASPIRATES AND BIOPSY Physician: Juliene SAUNDERS. Henn, MD MEDICATIONS: Moderate sedation ANESTHESIA/SEDATION: Moderate (conscious) sedation was employed during this procedure. A total of Versed  1 mg and fentanyl  50 mcg was administered intravenously at the order of the provider performing the procedure. Total intra-service moderate sedation time:  . Patient's level of consciousness and vital signs were monitored continuously by radiology nurse throughout the procedure under the supervision of the provider performing the procedure. FLUOROSCOPY: Radiation Exposure Index (as provided by the fluoroscopic device): 3 mGy Kerma COMPLICATIONS: None immediate. PROCEDURE: The procedure was explained to the patient. The risks and benefits of the procedure were discussed and the patient's questions were addressed. Informed consent was obtained  from the patient. The patient was placed prone on interventional table. The back was prepped and draped in sterile fashion. Maximal barrier sterile technique was utilized including caps, mask, sterile gowns, sterile gloves, sterile drape, hand hygiene and skin antiseptic. The skin and right posterior ilium were anesthetized with 1% lidocaine . 11 gauge bone needle was directed into the right ilium with fluoroscopic guidance. Two aspirates and 2 core biopsies were obtained. Bandage placed over the puncture site. Fluoroscopic image saved for documentation. IMPRESSION: Fluoroscopic guided bone marrow aspiration and core biopsy. Electronically Signed   By: Juliene Balder M.D.   On: 03/18/2024 11:23                LOS: 4 days   Brigid Vandekamp  [facility]   Pager on [email or web address]. If 7PM-7AM, please contact  night-coverage at [email or web address]     03/18/2024, 1:41 PM           "

## 2024-03-18 NOTE — TOC Progression Note (Addendum)
 Transition of Care [facility]) - Progression Note    Patient Details  Name: Anita Harrell MRN: [id number] Date of Birth: 1950/04/05  [facility] CM/SW Contact  Lauraine JAYSON Carpen, LCSW Phone Number: 03/18/2024, 12:40 PM  Clinical Narrative:   Left voicemail for husband.  2:34 pm: Gave bed offers to son. He will review with patient's husband.  Expected Discharge Plan: Skilled Nursing Facility Barriers to Discharge: Continued Medical Work up               Expected Discharge Plan and Services In-house Referral: Clinical Social Work     Living arrangements for the past 2 months: Single Family Home                                       Social Drivers of Health (SDOH) Interventions SDOH Screenings   Food Insecurity: No Food Insecurity (03/15/2024)  Housing: Low Risk (03/15/2024)  Transportation Needs: No Transportation Needs (03/15/2024)  Utilities: Not At Risk (03/15/2024)  Alcohol Screen: Low Risk (07/27/2023)  Depression (PHQ2-9): High Risk (02/21/2024)  Financial Resource Strain: Low Risk  (09/19/2023)   Received from [facility]  Physical Activity: Inactive (07/27/2023)  Social Connections: Socially Integrated (03/15/2024)  Recent Concern: Social Connections - Moderately Isolated (12/16/2023)  Stress: No Stress Concern Present (07/27/2023)  Tobacco Use: Medium Risk (03/14/2024)  Health Literacy: Adequate Health Literacy (07/27/2023)    Readmission Risk Interventions     No data to display

## 2024-03-18 NOTE — Progress Notes (Addendum)
 Physical Therapy Treatment Patient Details Name: Anita Harrell MRN: [id number] DOB: September 12, 1950 Today's Date: 03/18/2024   History of Present Illness Patient is a 74 year old female with weakness, confusion, neck and back pain. Found to have UTI, T6 and T8 compression fracture. PMH: HFpEF, paroxysmal A-fib, HLD, dementia, depression, and generalized anxiety disorder, chronic neck and back pain, OSA.    PT Comments  Patient is agreeable to PT session. Supportive son at the bedside initially. Increased activity tolerance this session. Patient continues to require assistance with bed mobility. She was able to stand with +2 person assistance. Limited standing tolerance for progression of activity. Rehabilitation < 3 hours/day recommended after this hospital stay.    If plan is discharge home, recommend the following: A lot of help with walking and/or transfers;A lot of help with bathing/dressing/bathroom;Assist for transportation;Help with stairs or ramp for entrance;Assistance with cooking/housework;Assistance with feeding;Direct supervision/assist for medications management;Direct supervision/assist for financial management   Can travel by private vehicle     No  Equipment Recommendations  None recommended by PT    Recommendations for Other Services       Precautions / Restrictions Precautions Precautions: Fall Recall of Precautions/Restrictions: Impaired Precaution/Restrictions Comments: chronic back pain with prior fractures Restrictions Weight Bearing Restrictions Per Provider Order: No     Mobility  Bed Mobility Overal bed mobility: Needs Assistance Bed Mobility: Supine to Sit, Sit to Supine     Supine to sit: Mod assist, +2 for physical assistance Sit to supine: +2 for physical assistance, Mod assist   General bed mobility comments: cues for technique. assistance for LE and trunk support. cues for initiation.    Transfers Overall transfer level: Needs  assistance Equipment used: 2 person hand held assist Transfers: Sit to/from Stand Sit to Stand: Min assist, Mod assist, +2 physical assistance           General transfer comment: lifting and lowering assistance provided for standing    Ambulation/Gait             Pre-gait activities: side steps to the left with +2 person assistance. activity tolerance limited by fatigue. unable to walk away from the bed at this time     Stairs             Wheelchair Mobility     Tilt Bed    Modified Rankin (Stroke Patients Only)       Balance Overall balance assessment: Needs assistance Sitting-balance support: Feet supported, Single extremity supported Sitting balance-Leahy Scale: Good     Standing balance support: Bilateral upper extremity supported Standing balance-Leahy Scale: Poor Standing balance comment: external support required                            Communication Communication Communication: No apparent difficulties  Cognition Arousal: Alert Behavior During Therapy: WFL for tasks assessed/performed   PT - Cognitive impairments: History of cognitive impairments, Initiation, Sequencing, Problem solving                       PT - Cognition Comments: cooperative throughout session Following commands: Impaired Following commands impaired: Follows one step commands inconsistently, Follows one step commands with increased time    Cueing Cueing Techniques: Verbal cues, Gestural cues, Tactile cues  Exercises      General Comments        Pertinent Vitals/Pain Pain Assessment Pain Assessment: Faces Faces Pain Scale: Hurts a little bit Pain  Location: neck/back Pain Descriptors / Indicators: Discomfort Pain Intervention(s): Limited activity within patient's tolerance    Home Living                          Prior Function            PT Goals (current goals can now be found in the care plan section) Acute Rehab PT  Goals Patient Stated Goal: to get therapy PT Goal Formulation: With patient/family Time For Goal Achievement: 03/29/24 Potential to Achieve Goals: Fair Progress towards PT goals: Progressing toward goals    Frequency    Min 2X/week      PT Plan      Co-evaluation   Reason for Co-Treatment: For patient/therapist safety;Necessary to address cognition/behavior during functional activity;To address functional/ADL transfers PT goals addressed during session: Mobility/safety with mobility;Balance OT goals addressed during session: ADL's and self-care      AM-PAC PT 6 Clicks Mobility   Outcome Measure  Help needed turning from your back to your side while in a flat bed without using bedrails?: A Lot Help needed moving from lying on your back to sitting on the side of a flat bed without using bedrails?: Total Help needed moving to and from a bed to a chair (including a wheelchair)?: Total Help needed standing up from a chair using your arms (e.g., wheelchair or bedside chair)?: Total Help needed to walk in hospital room?: Total Help needed climbing 3-5 steps with a railing? : Total 6 Click Score: 7    End of Session   Activity Tolerance: Patient limited by fatigue Patient left: in bed;with call bell/phone within reach (with OT at bed side) Nurse Communication: Mobility status PT Visit Diagnosis: Muscle weakness (generalized) (M62.81);Unsteadiness on feet (R26.81)     Time: 8943-8887 PT Time Calculation (min) (ACUTE ONLY): 16 min  Charges:    $Therapeutic Activity: 8-22 mins PT General Charges $$ ACUTE PT VISIT: 1 Visit                     Randine Essex, PT, MPT    Randine LULLA Essex 03/18/2024, 12:14 PM

## 2024-03-18 NOTE — Procedures (Signed)
 Interventional Radiology Procedure:   Indications: Thrombocytopenia and anemia   Procedure: Image guided bone marrow biopsy  Findings: 2 aspirates and 2 cores from right ilium  Complications: None     EBL: Minimal, less than 10 ml  Plan: Bedrest 1 hour   Denecia Brunette R. Philip, MD  Pager: [phone]

## 2024-03-19 ENCOUNTER — Other Ambulatory Visit: Payer: Self-pay

## 2024-03-19 ENCOUNTER — Telehealth: Payer: Self-pay | Admitting: Oncology

## 2024-03-19 DIAGNOSIS — R195 Other fecal abnormalities: Secondary | ICD-10-CM | POA: Diagnosis not present

## 2024-03-19 DIAGNOSIS — D696 Thrombocytopenia, unspecified: Secondary | ICD-10-CM | POA: Diagnosis not present

## 2024-03-19 DIAGNOSIS — N3000 Acute cystitis without hematuria: Secondary | ICD-10-CM | POA: Diagnosis not present

## 2024-03-19 DIAGNOSIS — D7589 Other specified diseases of blood and blood-forming organs: Secondary | ICD-10-CM

## 2024-03-19 DIAGNOSIS — S22000A Wedge compression fracture of unspecified thoracic vertebra, initial encounter for closed fracture: Secondary | ICD-10-CM | POA: Diagnosis not present

## 2024-03-19 LAB — GLUCOSE, CAPILLARY
Glucose-Capillary: 101 mg/dL — ABNORMAL HIGH (ref 70–99)
Glucose-Capillary: 115 mg/dL — ABNORMAL HIGH (ref 70–99)
Glucose-Capillary: 128 mg/dL — ABNORMAL HIGH (ref 70–99)
Glucose-Capillary: 82 mg/dL (ref 70–99)
Glucose-Capillary: 86 mg/dL (ref 70–99)
Glucose-Capillary: 90 mg/dL (ref 70–99)
Glucose-Capillary: 95 mg/dL (ref 70–99)
Glucose-Capillary: 95 mg/dL (ref 70–99)

## 2024-03-19 LAB — CBC
HCT: 26 % — ABNORMAL LOW (ref 36.0–46.0)
Hemoglobin: 8.4 g/dL — ABNORMAL LOW (ref 12.0–15.0)
MCH: 29.5 pg (ref 26.0–34.0)
MCHC: 32.3 g/dL (ref 30.0–36.0)
MCV: 91.2 fL (ref 80.0–100.0)
Platelets: 30 K/uL — ABNORMAL LOW (ref 150–400)
RBC: 2.85 MIL/uL — ABNORMAL LOW (ref 3.87–5.11)
RDW: 16.3 % — ABNORMAL HIGH (ref 11.5–15.5)
WBC: 8.8 K/uL (ref 4.0–10.5)
nRBC: 0.7 % — ABNORMAL HIGH (ref 0.0–0.2)

## 2024-03-19 LAB — BASIC METABOLIC PANEL WITH GFR
Anion gap: 10 (ref 5–15)
BUN: 8 mg/dL (ref 8–23)
CO2: 26 mmol/L (ref 22–32)
Calcium: 10.9 mg/dL — ABNORMAL HIGH (ref 8.9–10.3)
Chloride: 102 mmol/L (ref 98–111)
Creatinine, Ser: 0.47 mg/dL (ref 0.44–1.00)
GFR, Estimated: >60 mL/min
Glucose, Bld: 93 mg/dL (ref 70–99)
Potassium: 3.5 mmol/L (ref 3.5–5.1)
Sodium: 138 mmol/L (ref 135–145)

## 2024-03-19 LAB — MAGNESIUM: Magnesium: 2 mg/dL (ref 1.7–2.4)

## 2024-03-19 MED ORDER — POTASSIUM CHLORIDE CRYS ER 20 MEQ PO TBCR
40.0000 meq | EXTENDED_RELEASE_TABLET | Freq: Once | ORAL | Status: AC
  Administered 2024-03-19: 40 meq via ORAL
  Filled 2024-03-19: qty 2

## 2024-03-19 MED ORDER — AMOXICILLIN 500 MG PO CAPS: 500.0000 mg | ORAL_CAPSULE | Freq: Three times a day (TID) | ORAL | Status: AC

## 2024-03-19 MED ORDER — POLYETHYLENE GLYCOL 3350 17 G PO PACK: 17.0000 g | PACK | Freq: Every day | ORAL | Status: DC | PRN

## 2024-03-19 MED ORDER — POLYETHYLENE GLYCOL 3350 17 G PO PACK
17.0000 g | PACK | Freq: Every day | ORAL | Status: DC | PRN
  Administered 2024-03-19 – 2024-03-25 (×2): 17 g via ORAL
  Filled 2024-03-19 (×2): qty 1

## 2024-03-19 NOTE — Discharge Summary (Signed)
 " [facility] Phys[facility] Discharge Summary   Patient name: Anita Harrell  Admit date:     03/14/2024  Discharge date: 03/19/2024  Attending Physician: JERELENE CRITCHLEY [[id number]]  Discharge Physician: Norval Bar   PCP: Randeen [name], MD  Admitted From: Home  Disposition:  [name]  Recommendations for Outpatient Follow-up:  Follow up with PCP in 1-2 weeks Follow up repeat blood work in 1 week Ensure follow up with gastroenterology clinic for colonoscopy to evaluate acute on chronic anemia Ensure follow up with hematology/oncology for bicytopenia and bone marrow biopsy results  Home Health:No Equipment/Devices: @ECDMELIST @  Discharge Condition:Stable CODE STATUS:DNR/DNI Diet recommendation: Heart Healthy Fluid Restriction: None  Hospital Summary:  74 y.o. female  with medical history significant of chronic HFpEF, paroxysmal A-fib, HLD, dementia, depression, and generalized anxiety disorder, chronic neck and back pain, OSA, who presented to the ED because of general weakness, increasing confusion, neck and back pain.  He has chronic neck and back pain/lumbar radiculopathy and recently got steroid injection on 03/03/2024.  Since discharge from the rehab facility about a week before Thanksgiving, patient's condition has declined.  She has gotten weaker and weaker and she has been intermittently confused and confusion is typically worse in the evenings.  Husband and family has become increasingly difficult to take care of her at home.  She is to be able to ambulate with a walker.  She is now unable to ambulate.  Recently got 2 rounds of antibiotics for UTI.  She was prescribed 5 days of nitrofurantoin by her neurologist on 01/24/2024.  She was given second round of antibiotics with Keflex  on 02/21/2024.  Urine culture from 02/21/2024 showed pansensitive E. coli.   Admitted for recurrent UTI treated with IV ceftriaxone  then transitioned to amoxicillin  to finish a total 5 day  course on 03/19/24.  Found to have S. Epidermidis bloodstream contamination.  Hypotension resolved with IV hydration and stopped home diuresis. Follow up with PCP and cardiology for resumption timing given history of heart failure. Euvolemic at present  Bicytopenia with acute on chronic anemia and thrombocytopenia. Etiology remains unclear. No signs/symptoms of acute bleed. Blood counts are low but stable. Status post bone marrow biopsy on 03/19/23. Discussed with GI who recommended outpatient follow up.  - stopped home eliquis  - given referral to follow up with GI clinic for possible colonoscopy - given referral to follow up with hematology/oncology to follow up on bone biopsy results  Thoracic compression fractures at T6 and T8. Seen by neurosurgery with recommendation for conservative management and TLSO brace for comfort. Patient declined TLSO brace. Seen by PT/OT during admission. Stable for discharge to SNF at this time.  Chronic hypoxic respiratory failure. Continue on home 2-3 L oxygen . Wean as tolerated   Discharge Diagnoses:  Principal Problem:   UTI (urinary tract infection) Active Problems:   Thrombocytopenia   Other pancytopenia (HCC)   Pressure injury of skin   Compression fracture of body of thoracic vertebra (HCC)   Occult blood in stools   Discharge Instructions  Discharge Instructions     Ambulatory referral to Gastroenterology   Complete by: As directed    What is the reason for referral?: Colonoscopy   Ambulatory referral to Hematology / Oncology   Complete by: As directed    Discharge wound care:   Complete by: As directed    R buttock stage 2 pressure injury present on admission Change 2 x weekly and PRN if soiled   Increase activity slowly  Complete by: As directed       Allergies as of 03/19/2024       Reactions   Abilify [aripiprazole] Other (See Comments)   Vision problems   Ambien  [zolpidem ] Other (See Comments)   Hallucinations MS change     Codeine Nausea And Vomiting   Patient able to tolerate hydrocodone  and oxycodone    Lipitor [atorvastatin] Other (See Comments)   Myalgias    Vesicare  [solifenacin ] Other (See Comments)   MS change   Wellbutrin  [bupropion ] Other (See Comments)   Unknown reaction        Medication List     STOP taking these medications    apixaban  5 MG Tabs tablet Commonly known as: ELIQUIS    carbidopa-levodopa 25-100 MG tablet Commonly known as: SINEMET IR   cephALEXin  500 MG capsule Commonly known as: KEFLEX    cyclobenzaprine  10 MG tablet Commonly known as: FLEXERIL    diazepam  5 MG tablet Commonly known as: VALIUM    furosemide  20 MG tablet Commonly known as: LASIX    lidocaine  5 % Commonly known as: LIDODERM        TAKE these medications    acetaminophen  500 MG tablet Commonly known as: TYLENOL  Take 500 mg by mouth in the morning and at bedtime.   albuterol  108 (90 Base) MCG/ACT inhaler Commonly known as: VENTOLIN  HFA Inhale 2 puffs into the lungs every 6 (six) hours as needed for wheezing or shortness of breath.   amoxicillin  500 MG capsule Commonly known as: AMOXIL  Take 1 capsule (500 mg total) by mouth every 8 (eight) hours for 2 doses.   donepezil  5 MG tablet Commonly known as: ARICEPT  Take 5 mg by mouth daily.   DULoxetine  60 MG capsule Commonly known as: CYMBALTA  Take 120 mg by mouth daily.   flecainide  100 MG tablet Commonly known as: TAMBOCOR  Take 1 tablet (100 mg total) by mouth 2 (two) times daily.   HYDROcodone -acetaminophen  5-325 MG tablet Commonly known as: NORCO/VICODIN Take 1 tablet by mouth every 6 (six) hours as needed for severe pain (pain score 7-10) or moderate pain (pain score 4-6).   hydrOXYzine  25 MG capsule Commonly known as: VISTARIL  Take 25 mg by mouth 2 (two) times daily.   Melatonin 5 MG Caps Take 10 mg by mouth at bedtime.   metoprolol  succinate 50 MG 24 hr tablet Commonly known as: TOPROL -XL TAKE ONE TABLET BY MOUTH TWICE A  DAY WITH MEALS   polyethylene glycol 17 g packet Commonly known as: MIRALAX  / GLYCOLAX  Take 17 g by mouth daily as needed for mild constipation, moderate constipation or severe constipation.   potassium chloride  SA 20 MEQ tablet Commonly known as: KLOR-CON  M Take 0.5 tablets (10 mEq total) by mouth 3 (three) times daily.   rosuvastatin  10 MG tablet Commonly known as: CRESTOR  TAKE ONE TABLET BY MOUTH ONCE A DAY   traMADol  50 MG tablet Commonly known as: ULTRAM  Take 50-100 mg by mouth every 8 (eight) hours as needed for moderate pain (pain score 4-6).   traZODone  100 MG tablet Commonly known as: DESYREL  Take 200 mg by mouth at bedtime.   Trelegy Ellipta  100-62.5-25 MCG/ACT Aepb Generic drug: Fluticasone -Umeclidin-Vilant Inhale 1 puff into the lungs daily.   VITAMIN D PO Take 1 capsule by mouth daily.               Discharge Care Instructions  (From admission, onward)           Start     Ordered   03/19/24 0000  Discharge  wound care:       Comments: R buttock stage 2 pressure injury present on admission Change 2 x weekly and PRN if soiled   03/19/24 1246            Contact information for follow-up providers     Ulis Bottcher, PA-C Follow up on 04/07/2024.   Specialty: Physician Assistant Why: with thoracic xrays prior. Contact information: [address] [place] [address] [phone]              Contact information for after-discharge care     Destination     [facility] .   Service: Skilled Nursing Contact information: [address] [place]  [address] [phone]                    Allergies[1]  Discharge Exam: Vitals:   03/19/24 0807 03/19/24 1133  BP: (!) 119/52 (!) 99/53  Pulse: 79 79  Resp: 16 16  Temp: 97.8 F (36.6 C) (!) 97.2 F (36.2 C)  SpO2: 100% 99%    Physical Exam Vitals and nursing note reviewed.  Constitutional:      General:  She is not in acute distress.    Appearance: She is ill-appearing.     Comments: Weak, frail  HENT:     Head: Normocephalic and atraumatic.  Cardiovascular:     Rate and Rhythm: Normal rate and regular rhythm.     Pulses: Normal pulses.     Heart sounds: Normal heart sounds.  Pulmonary:     Effort: Pulmonary effort is normal.     Breath sounds: Normal breath sounds.  Abdominal:     General: Bowel sounds are normal.     Palpations: Abdomen is soft.  Neurological:     Mental Status: She is alert. Mental status is at baseline.     The results of significant diagnostics from this hospitalization (including imaging, microbiology, ancillary and laboratory) are listed below for reference.    Microbiology: Recent Results (from the past 240 hours)  Urine Culture     Status: Abnormal   Collection Time: 03/14/24  4:21 PM   Specimen: Urine, Clean Catch  Result Value Ref Range Status   Specimen Description   Final    URINE, CLEAN CATCH Performed at [facility], [address], [place], [place] [address]    Special Requests   Final    NONE Performed at [facility], [address] [place], [place] [address]    Culture >=100,000 COLONIES/mL ESCHERICHIA COLI (A)  Final   Report Status 03/17/2024 FINAL  Final   Organism ID, Bacteria ESCHERICHIA COLI (A)  Final      Susceptibility   Escherichia coli - MIC*    AMPICILLIN <=2 SENSITIVE Sensitive     CEFAZOLIN  (URINE) Value in next row Sensitive      <=1 SENSITIVEThis is a modified FDA-approved test that has been validated and its performance characteristics determined by the reporting laboratory.  This laboratory is certified under the Clinical Laboratory Improvement Amendments CLIA as qualified to perform high complexity clinical laboratory testing.    CEFEPIME Value in next row Sensitive      <=1 SENSITIVEThis is a modified FDA-approved test that has been validated and its performance characteristics determined by the  reporting laboratory.  This laboratory is certified under the Clinical Laboratory Improvement Amendments CLIA as qualified to perform high complexity clinical laboratory testing.    ERTAPENEM Value in  next row Sensitive      <=1 SENSITIVEThis is a modified FDA-approved test that has been validated and its performance characteristics determined by the reporting laboratory.  This laboratory is certified under the Clinical Laboratory Improvement Amendments CLIA as qualified to perform high complexity clinical laboratory testing.    CEFTRIAXONE  Value in next row Sensitive      <=1 SENSITIVEThis is a modified FDA-approved test that has been validated and its performance characteristics determined by the reporting laboratory.  This laboratory is certified under the Clinical Laboratory Improvement Amendments CLIA as qualified to perform high complexity clinical laboratory testing.    CIPROFLOXACIN  Value in next row Sensitive      <=1 SENSITIVEThis is a modified FDA-approved test that has been validated and its performance characteristics determined by the reporting laboratory.  This laboratory is certified under the Clinical Laboratory Improvement Amendments CLIA as qualified to perform high complexity clinical laboratory testing.    GENTAMICIN Value in next row Sensitive      <=1 SENSITIVEThis is a modified FDA-approved test that has been validated and its performance characteristics determined by the reporting laboratory.  This laboratory is certified under the Clinical Laboratory Improvement Amendments CLIA as qualified to perform high complexity clinical laboratory testing.    NITROFURANTOIN Value in next row Sensitive      <=1 SENSITIVEThis is a modified FDA-approved test that has been validated and its performance characteristics determined by the reporting laboratory.  This laboratory is certified under the Clinical Laboratory Improvement Amendments CLIA as qualified to perform high complexity clinical  laboratory testing.    TRIMETH /SULFA  Value in next row Sensitive      <=1 SENSITIVEThis is a modified FDA-approved test that has been validated and its performance characteristics determined by the reporting laboratory.  This laboratory is certified under the Clinical Laboratory Improvement Amendments CLIA as qualified to perform high complexity clinical laboratory testing.    AMPICILLIN/SULBACTAM Value in next row Sensitive      <=1 SENSITIVEThis is a modified FDA-approved test that has been validated and its performance characteristics determined by the reporting laboratory.  This laboratory is certified under the Clinical Laboratory Improvement Amendments CLIA as qualified to perform high complexity clinical laboratory testing.    PIP/TAZO Value in next row Sensitive      <=4 SENSITIVEThis is a modified FDA-approved test that has been validated and its performance characteristics determined by the reporting laboratory.  This laboratory is certified under the Clinical Laboratory Improvement Amendments CLIA as qualified to perform high complexity clinical laboratory testing.    MEROPENEM Value in next row Sensitive      <=4 SENSITIVEThis is a modified FDA-approved test that has been validated and its performance characteristics determined by the reporting laboratory.  This laboratory is certified under the Clinical Laboratory Improvement Amendments CLIA as qualified to perform high complexity clinical laboratory testing.    * >=100,000 COLONIES/mL ESCHERICHIA COLI  Blood culture (routine x 2)     Status: Abnormal   Collection Time: 03/14/24  7:14 PM   Specimen: BLOOD  Result Value Ref Range Status   Specimen Description   Final    BLOOD LEFT ANTECUBITAL Performed at [facility], [address] [place], [place] [address]    Special Requests   Final    BOTTLES DRAWN AEROBIC AND ANAEROBIC Blood Culture results may not be optimal due to an inadequate volume of blood received in culture  bottles Performed at [facility], [address] [place], [place] [address]  Culture  Setup Time   Final    GRAM POSITIVE COCCI AEROBIC BOTTLE ONLY Organism ID to follow CRITICAL RESULT CALLED TO, READ BACK BY AND VERIFIED WITH: JASON ROBBINS @ 03/15/2024 2140 AB Performed at [facility], [address] [place], [place] [address]    Culture (A)  Final    STAPHYLOCOCCUS EPIDERMIDIS THE SIGNIFICANCE OF ISOLATING THIS ORGANISM FROM A SINGLE VENIPUNCTURE CANNOT BE PREDICTED WITHOUT FURTHER CLINICAL AND CULTURE CORRELATION. SUSCEPTIBILITIES AVAILABLE ONLY ON REQUEST. Performed at [facility], [address], [place], [place] [address]    Report Status 03/18/2024 FINAL  Final  Blood Culture ID Panel (Reflexed)     Status: Abnormal   Collection Time: 03/14/24  7:14 PM  Result Value Ref Range Status   Enterococcus faecalis NOT DETECTED NOT DETECTED Final   Enterococcus Faecium NOT DETECTED NOT DETECTED Final   Listeria monocytogenes NOT DETECTED NOT DETECTED Final   Staphylococcus species DETECTED (A) NOT DETECTED Final    Comment: CRITICAL RESULT CALLED TO, READ BACK BY AND VERIFIED WITH: JASON ROBBINS @ 03/15/2024 2140 AB    Staphylococcus aureus (BCID) NOT DETECTED NOT DETECTED Final   Staphylococcus epidermidis DETECTED (A) NOT DETECTED Final    Comment: Methicillin (oxacillin) resistant coagulase negative staphylococcus. Possible blood culture contaminant (unless isolated from more than one blood culture draw or clinical case suggests pathogenicity). No antibiotic treatment is indicated for blood  culture contaminants. CRITICAL RESULT CALLED TO, READ BACK BY AND VERIFIED WITH: JASON ROBBINS @ 03/15/2024 2140 AB    Staphylococcus lugdunensis NOT DETECTED NOT DETECTED Final   Streptococcus species NOT DETECTED NOT DETECTED Final   Streptococcus agalactiae NOT DETECTED NOT DETECTED Final   Streptococcus pneumoniae NOT DETECTED NOT DETECTED Final    Streptococcus pyogenes NOT DETECTED NOT DETECTED Final   A.calcoaceticus-baumannii NOT DETECTED NOT DETECTED Final   Bacteroides fragilis NOT DETECTED NOT DETECTED Final   Enterobacterales NOT DETECTED NOT DETECTED Final   Enterobacter cloacae complex NOT DETECTED NOT DETECTED Final   Escherichia coli NOT DETECTED NOT DETECTED Final   Klebsiella aerogenes NOT DETECTED NOT DETECTED Final   Klebsiella oxytoca NOT DETECTED NOT DETECTED Final   Klebsiella pneumoniae NOT DETECTED NOT DETECTED Final   Proteus species NOT DETECTED NOT DETECTED Final   Salmonella species NOT DETECTED NOT DETECTED Final   Serratia marcescens NOT DETECTED NOT DETECTED Final   Haemophilus influenzae NOT DETECTED NOT DETECTED Final   Neisseria meningitidis NOT DETECTED NOT DETECTED Final   Pseudomonas aeruginosa NOT DETECTED NOT DETECTED Final   Stenotrophomonas maltophilia NOT DETECTED NOT DETECTED Final   Candida albicans NOT DETECTED NOT DETECTED Final   Candida auris NOT DETECTED NOT DETECTED Final   Candida glabrata NOT DETECTED NOT DETECTED Final   Candida krusei NOT DETECTED NOT DETECTED Final   Candida parapsilosis NOT DETECTED NOT DETECTED Final   Candida tropicalis NOT DETECTED NOT DETECTED Final   Cryptococcus neoformans/gattii NOT DETECTED NOT DETECTED Final   Methicillin resistance mecA/C DETECTED (A) NOT DETECTED Final    Comment: CRITICAL RESULT CALLED TO, READ BACK BY AND VERIFIED WITH: JASON ROBBINS @ 03/15/2024 2140 AB Performed at [facility], [address] [place], [place] [address]      Labs: ProBNP, BNP (last 5 results) Recent Labs    03/15/24 0823  PROBNP 1,237.0*   Basic Metabolic Panel: Recent Labs  Lab 03/15/24 0823 03/16/24 0425 03/17/24 0419 03/18/24 0346 03/19/24 0436  NA 139 140 140 137 138  K 3.3* 3.6 3.5  2.9* 3.5  CL 104 104 104 100 102  CO2 27 25 27 28 26   GLUCOSE 97 109* 87 91 93  BUN 6* 5* 8 8 8   CREATININE 0.48 0.46 0.57 0.53 0.47  CALCIUM  9.8  10.1 10.9* 10.5* 10.9*  MG  --  1.8  --  1.9 2.0  PHOS  --   --   --  4.4  --    Liver Function Tests: Recent Labs  Lab 03/14/24 1621 03/18/24 0346  AST 40  --   ALT 10  --   ALKPHOS 119  --   BILITOT 0.5  --   PROT 5.0*  --   ALBUMIN  2.5* 2.4*   No results for input(s): LIPASE, AMYLASE in the last 168 hours. No results for input(s): AMMONIA in the last 168 hours. CBC: Recent Labs  Lab 03/15/24 0823 03/16/24 0425 03/17/24 0419 03/18/24 0346 03/19/24 0436  WBC 10.0 9.6 9.2 7.9 8.8  HGB 8.6* 9.1* 8.9* 8.6* 8.4*  HCT 27.2* 28.1* 27.2* 26.0* 26.0*  MCV 92.2 90.9 90.7 90.6 91.2  PLT 35* 32* 29* 27* 30*   Cardiac Enzymes: No results for input(s): CKTOTAL, CKMB, CKMBINDEX, TROPONINI, TROPONINIHS in the last 168 hours. BNP: No results for input(s): BNP in the last 168 hours. CBG: Recent Labs  Lab 03/18/24 2040 03/19/24 0002 03/19/24 0441 03/19/24 0800 03/19/24 1125  GLUCAP 105* 95 101* 115* 95   D-Dimer No results for input(s): DDIMER in the last 72 hours. Hgb A1c No results for input(s): HGBA1C in the last 72 hours. Lipid Profile No results for input(s): CHOL, HDL, LDLCALC, TRIG, CHOLHDL, LDLDIRECT in the last 72 hours. Thyroid  function studies No results for input(s): TSH, T4TOTAL, FREET4, T3FREE, THYROIDAB in the last 72 hours.  Invalid input(s): FREET3 Anemia work up Recent Labs    03/17/24 0419  RETICCTPCT 2.2   Urinalysis    Component Value Date/Time   COLORURINE AMBER (A) 03/14/2024 1621   APPEARANCEUR CLOUDY (A) 03/14/2024 1621   LABSPEC 1.020 03/14/2024 1621   PHURINE 5.0 03/14/2024 1621   GLUCOSEU NEGATIVE 03/14/2024 1621   HGBUR NEGATIVE 03/14/2024 1621   BILIRUBINUR NEGATIVE 03/14/2024 1621   BILIRUBINUR Negative 02/21/2024 1258   KETONESUR NEGATIVE 03/14/2024 1621   PROTEINUR 30 (A) 03/14/2024 1621   UROBILINOGEN 0.2 02/21/2024 1258   NITRITE NEGATIVE 03/14/2024 1621   LEUKOCYTESUR TRACE  (A) 03/14/2024 1621   Sepsis Labs Recent Labs  Lab 03/16/24 0425 03/17/24 0419 03/18/24 0346 03/19/24 0436  WBC 9.6 9.2 7.9 8.8    Procedures/Studies: IR BONE MARROW BIOPSY & ASPIRATION Result Date: 03/18/2024 INDICATION: 74 year old with anemia and thrombocytopenia. EXAM: FLUOROSCOPIC GUIDED BONE MARROW ASPIRATES AND BIOPSY Physician: Juliene SAUNDERS. Henn, MD MEDICATIONS: Moderate sedation ANESTHESIA/SEDATION: Moderate (conscious) sedation was employed during this procedure. A total of Versed  1 mg and fentanyl  50 mcg was administered intravenously at the order of the provider performing the procedure. Total intra-service moderate sedation time:  . Patient's level of consciousness and vital signs were monitored continuously by radiology nurse throughout the procedure under the supervision of the provider performing the procedure. FLUOROSCOPY: Radiation Exposure Index (as provided by the fluoroscopic device): 3 mGy Kerma COMPLICATIONS: None immediate. PROCEDURE: The procedure was explained to the patient. The risks and benefits of the procedure were discussed and the patient's questions were addressed. Informed consent was obtained from the patient. The patient was placed prone on interventional table. The back was prepped and draped in sterile fashion. Maximal barrier sterile technique was utilized including  caps, mask, sterile gowns, sterile gloves, sterile drape, hand hygiene and skin antiseptic. The skin and right posterior ilium were anesthetized with 1% lidocaine . 11 gauge bone needle was directed into the right ilium with fluoroscopic guidance. Two aspirates and 2 core biopsies were obtained. Bandage placed over the puncture site. Fluoroscopic image saved for documentation. IMPRESSION: Fluoroscopic guided bone marrow aspiration and core biopsy. Electronically Signed   By: Juliene Balder M.D.   On: 03/18/2024 11:23   CT CERVICAL SPINE WO CONTRAST Result Date: 03/15/2024 EXAM: CT CERVICAL SPINE  WITHOUT CONTRAST 03/15/2024 02:22:42 AM TECHNIQUE: CT of the cervical spine was performed without the administration of intravenous contrast. Multiplanar reformatted images are provided for review. Automated exposure control, iterative reconstruction, and/or weight based adjustment of the mA/kV was utilized to reduce the radiation dose to as low as reasonably achievable. COMPARISON: None available. CLINICAL HISTORY: neck pain FINDINGS: BONES AND ALIGNMENT: No acute fracture or traumatic malalignment. DEGENERATIVE CHANGES: Mild degenerative disc disease without spinal canal stenosis. Multilevel facet arthrosis. No high-grade foraminal stenosis. SOFT TISSUES: No prevertebral soft tissue swelling. IMPRESSION: 1. Mild degenerative disc disease without spinal canal stenosis. 2. Multilevel facet arthrosis without high-grade foraminal stenosis. Electronically signed by: [name] Stanford MD 03/15/2024 02:31 AM EST RP Workstation: [id number]   CT Angio Chest/Abd/Pel for Dissection W and/or Wo Contrast Result Date: 03/14/2024 EXAM: CTA CHEST, ABDOMEN AND PELVIS WITH AND WITHOUT CONTRAST 03/14/2024 08:35:25 PM TECHNIQUE: CTA of the chest was performed with and without the administration of intravenous contrast. CTA of the abdomen and pelvis was performed with and without the administration of intravenous contrast. Multiplanar reformatted images are provided for review. MIP images are provided for review. Automated exposure control, iterative reconstruction, and/or weight based adjustment of the mA/kV was utilized to reduce the radiation dose to as low as reasonably achievable. COMPARISON: 12/15/2023. CLINICAL HISTORY: weakness, back pain FINDINGS: VASCULATURE: Coronary artery and aortic atherosclerosis are present. AORTA: Aortic atherosclerosis is present. No acute finding. No abdominal aortic aneurysm. No dissection. PULMONARY ARTERIES: No pulmonary embolism with the limits of this exam. GREAT VESSELS OF AORTIC ARCH: No acute  finding. No dissection. No arterial occlusion or significant stenosis. CELIAC TRUNK: No acute finding. No occlusion or significant stenosis. SUPERIOR MESENTERIC ARTERY: No acute finding. No occlusion or significant stenosis. INFERIOR MESENTERIC ARTERY: No acute finding. No occlusion or significant stenosis. RENAL ARTERIES: No acute finding. No occlusion or significant stenosis. ILIAC ARTERIES: No acute finding. No occlusion or significant stenosis. CHEST: MEDIASTINUM: The heart is borderline in size. Coronary artery atherosclerosis is present. No mediastinal lymphadenopathy. The pericardium demonstrates no acute abnormality. LUNGS AND PLEURA: Dependent opacities in the lower lobes bilaterally, left greater than right. Favor atelectasis although pneumonia cannot be excluded in the left lower lobe. No evidence of pleural effusion or pneumothorax. THORACIC BONES AND SOFT TISSUES: Healed posterior right lower rib fractures. These are unchanged since prior study. Mild depression through the inferior endplate of T6 and superior endplate of T8, new since prior study. ABDOMEN AND PELVIS: LIVER: The liver is unremarkable. GALLBLADDER AND BILE DUCTS: Gallbladder is unremarkable. No biliary ductal dilatation. SPLEEN: The spleen is unremarkable. PANCREAS: The pancreas is unremarkable. ADRENAL GLANDS: Bilateral adrenal glands demonstrate no acute abnormality. KIDNEYS, URETERS AND BLADDER: No stones in the kidneys or ureters. No hydronephrosis. No perinephric or periureteral stranding. Urinary bladder is unremarkable. GI AND BOWEL: Stomach and duodenal sweep demonstrate no acute abnormality. There is no bowel obstruction. No abnormal bowel wall thickening or distension. REPRODUCTIVE: Prior hysterectomy. No adnexal  mass. PERITONEUM AND RETROPERITONEUM: No ascites or free air. LYMPH NODES: No lymphadenopathy. ABDOMINAL BONES AND SOFT TISSUES: Right old fracture noted at the superior and inferior left pubic rami and left sacrum.  Chronic stable mild compression fracture at L5. No acute soft tissue abnormality. IMPRESSION: 1. Coronary artery, aortic atherosclerosis. No evidence of aneurysm or dissection. 2. New vertebral endplate compression fractures at T6 and T8 since prior study. 3. Dependent lower lobe opacities, left greater than right, favored atelectasis, although left lower lobe pneumonia is not excluded. Electronically signed by: [name] Crease MD 03/14/2024 09:29 PM EST RP Workstation: [id number]   DG Chest Portable 1 View Result Date: 03/14/2024 EXAM: 1 VIEW(S) XRAY OF THE CHEST 03/14/2024 07:31:00 PM COMPARISON: Comparison with 12/14/2023. CLINICAL HISTORY: weakness FINDINGS: LUNGS AND PLEURA: Shallow inspiration. Infiltration or atelectasis in the left base is new since the prior study. Probable small left pleural effusion. No pneumothorax. HEART AND MEDIASTINUM: No acute abnormality of the cardiac and mediastinal silhouettes. BONES AND SOFT TISSUES: Postoperative changes in the base of the neck. Degenerative changes in the spine and shoulders. No acute osseous abnormality. IMPRESSION: 1. New left basilar infiltration or atelectasis and probable small left pleural effusion. Electronically signed by: Elsie Gravely MD 03/14/2024 07:42 PM EST RP Workstation: [id number]    Time coordinating discharge: 45 mins  SIGNED:  Norval Bar, MD [facility] 03/19/2024, 12:47 PM     [1]  Allergies Allergen Reactions   Abilify [Aripiprazole] Other (See Comments)    Vision problems   Ambien  [Zolpidem ] Other (See Comments)    Hallucinations MS change    Codeine Nausea And Vomiting    Patient able to tolerate hydrocodone  and oxycodone    Lipitor [Atorvastatin] Other (See Comments)    Myalgias    Vesicare  [Solifenacin ] Other (See Comments)    MS change   Wellbutrin  [Bupropion ] Other (See Comments)    Unknown reaction   "

## 2024-03-19 NOTE — Telephone Encounter (Signed)
 Per secure chat from MD I saw her during admission. she is about to go to SNF.  she had a bone marrow biopsy. please arrange her to see me in 1-2 weeks follow up MD visit to go over results.    I scheduled appt and left voicemail for pt with appt details. Appt should also print on pt discharge paperwork.

## 2024-03-19 NOTE — Progress Notes (Signed)
 Physical Therapy Treatment Patient Details Name: Anita Harrell MRN: [id number] DOB: 09/08/1950 Today's Date: 03/19/2024   History of Present Illness Patient is a 74 year old female with weakness, confusion, neck and back pain. Found to have UTI, T6 and T8 compression fracture. PMH: HFpEF, paroxysmal A-fib, HLD, dementia, depression, and generalized anxiety disorder, chronic neck and back pain, OSA.    PT Comments  Pt was supine in bed with HOB elevated ~ 30 degrees. She agrees to session and remains cooperative. Max assist to roll L to short sit. Sat EOB x ~ 12 minutes total with perform BLEs there ex, BUE there ex, cervical AROM, and balance exercises. Overall pt remains severely weak and far form her baseline. Will benefit from STR at DC to maximize independence and safety with all ADLs. DC recs remain appropriate.    If plan is discharge home, recommend the following: A lot of help with walking and/or transfers;A lot of help with bathing/dressing/bathroom;Assist for transportation;Help with stairs or ramp for entrance;Assistance with cooking/housework;Assistance with feeding;Direct supervision/assist for medications management;Direct supervision/assist for financial management     Equipment Recommendations  None recommended by PT       Precautions / Restrictions Precautions Precautions: Fall Recall of Precautions/Restrictions: Impaired Precaution/Restrictions Comments: chronic back pain with prior fractures Restrictions Weight Bearing Restrictions Per Provider Order: No     Mobility  Bed Mobility Overal bed mobility: Needs Assistance Bed Mobility: Supine to Sit, Sit to Supine  Supine to sit: Max assist, Used rails Sit to supine: Max assist General bed mobility comments: Pt was able to achieve EOB sitting with increased time + max assist. Sat EOB x ~ 12 minutes total while perfroming ther ex and balance exercises.    Transfers  General transfer comment: Did not assess  transfers. Poor sitting balance while EOB      Balance Overall balance assessment: Needs assistance Sitting-balance support: Feet supported, Single extremity supported Sitting balance-Leahy Scale: Fair Sitting balance - Comments: Several occasions of posterior LOB in sitting       Communication Communication Communication: No apparent difficulties  Cognition Arousal: Alert Behavior During Therapy: WFL for tasks assessed/performed   PT - Cognitive impairments: History of cognitive impairments, Initiation, Sequencing, Problem solving      PT - Cognition Comments: cooperative throughout session Following commands: Intact Following commands impaired: Follows one step commands with increased time    Cueing Cueing Techniques: Verbal cues     General Comments General comments (skin integrity, edema, etc.): Pt perform EOB LAQ, marching, cervical and BUE ther ex. seem to tolerate well.      Pertinent Vitals/Pain Pain Assessment Pain Assessment: No/denies pain Pain Score: 0-No pain     PT Goals (current goals can now be found in the care plan section) Acute Rehab PT Goals Patient Stated Goal: Go home. Explained role of rehab prior to returning home. Progress towards PT goals: Progressing toward goals    Frequency    Min 2X/week       Co-evaluation     PT goals addressed during session: Mobility/safety with mobility;Balance;Proper use of DME;Strengthening/ROM        AM-PAC PT 6 Clicks Mobility   Outcome Measure  Help needed turning from your back to your side while in a flat bed without using bedrails?: A Lot Help needed moving from lying on your back to sitting on the side of a flat bed without using bedrails?: A Lot Help needed moving to and from a bed to a chair (including  a wheelchair)?: Total Help needed standing up from a chair using your arms (e.g., wheelchair or bedside chair)?: Total Help needed to walk in hospital room?: Total Help needed climbing  3-5 steps with a railing? : Total 6 Click Score: 8    End of Session   Activity Tolerance: Patient tolerated treatment well;Patient limited by fatigue Patient left: in bed;with call bell/phone within reach Nurse Communication: Mobility status PT Visit Diagnosis: Muscle weakness (generalized) (M62.81);Unsteadiness on feet (R26.81)     Time: 8471-8456 PT Time Calculation (min) (ACUTE ONLY): 15 min  Charges:    $Therapeutic Exercise: 8-22 mins PT General Charges $$ ACUTE PT VISIT: 1 Visit                    Rankin Essex PTA 03/19/2024, 4:09 PM

## 2024-03-19 NOTE — TOC Progression Note (Addendum)
 Transition of Care [facility]) - Progression Note    Patient Details  Name: Anita Harrell MRN: [id number] Date of Birth: September 24, 1950  [facility]) CM/SW Contact  Lauraine JAYSON Carpen, LCSW Phone Number: 03/19/2024, 10:36 AM  Clinical Narrative:   CSW met with patient's husband at bedside. He has accepted the bed offer from [facility]. Left message for admissions coordinator to notify.  1:25 pm: Husband reports that he was told by previous MD that it would take some time to discharge due to platelets. Husband concerned that patient is not medically stable yet. CSW asked MD to call him.  2:24 pm: Husband has called to start appeal. DND and HINN-12 completed and reviewed with husband over the phone. Left forms in the room for him to pick up when he comes back to the hospital.  Expected Discharge Plan: Skilled Nursing Facility Barriers to Discharge: Continued Medical Work up               Expected Discharge Plan and Services In-house Referral: Clinical Social Work     Living arrangements for the past 2 months: Single Family Home                                       Social Drivers of Health (SDOH) Interventions SDOH Screenings   Food Insecurity: No Food Insecurity (03/15/2024)  Housing: Low Risk (03/15/2024)  Transportation Needs: No Transportation Needs (03/15/2024)  Utilities: Not At Risk (03/15/2024)  Alcohol Screen: Low Risk (07/27/2023)  Depression (PHQ2-9): High Risk (02/21/2024)  Financial Resource Strain: Low Risk  (09/19/2023)   [facility]  Physical Activity: Inactive (07/27/2023)  Social Connections: Socially Integrated (03/15/2024)  Recent Concern: Social Connections - Moderately Isolated (12/16/2023)  Stress: No Stress Concern Present (07/27/2023)  Tobacco Use: Medium Risk (03/14/2024)  Health Literacy: Adequate Health Literacy (07/27/2023)    Readmission Risk Interventions     No data to display

## 2024-03-19 NOTE — Discharge Instructions (Addendum)
 Follow up with PCP within one week Get blood work in 1 week Follow up with gastroenterology clinic in 2 weeks Follow up with hematology/oncology in 2 weeks Follow up with home hospice for symptomatic care and further goals of care discussions

## 2024-03-19 NOTE — Plan of Care (Signed)
" °  Problem: Education: Goal: Knowledge of General Education information will improve Description: Including pain rating scale, medication(s)/side effects and non-pharmacologic comfort measures Outcome: Progressing   Problem: Health Behavior/Discharge Planning: Goal: Ability to manage health-related needs will improve Outcome: Progressing   Problem: Clinical Measurements: Goal: Will remain free from infection Outcome: Progressing   Problem: Clinical Measurements: Goal: Respiratory complications will improve Outcome: Progressing   Problem: Pain Managment: Goal: General experience of comfort will improve and/or be controlled Outcome: Progressing   Problem: Safety: Goal: Ability to remain free from injury will improve Outcome: Progressing   Problem: Skin Integrity: Goal: Risk for impaired skin integrity will decrease Outcome: Progressing Patient is alert and pleasantly confused.  Denies any pain or discomfort.  Prn miralax  given, awaiting results. "

## 2024-03-20 ENCOUNTER — Encounter (HOSPITAL_COMMUNITY): Payer: Self-pay

## 2024-03-20 ENCOUNTER — Inpatient Hospital Stay

## 2024-03-20 ENCOUNTER — Inpatient Hospital Stay: Admitting: Oncology

## 2024-03-20 DIAGNOSIS — S065XAA Traumatic subdural hemorrhage with loss of consciousness status unknown, initial encounter: Secondary | ICD-10-CM

## 2024-03-20 DIAGNOSIS — N3000 Acute cystitis without hematuria: Secondary | ICD-10-CM | POA: Diagnosis not present

## 2024-03-20 DIAGNOSIS — D696 Thrombocytopenia, unspecified: Secondary | ICD-10-CM | POA: Diagnosis not present

## 2024-03-20 DIAGNOSIS — D649 Anemia, unspecified: Secondary | ICD-10-CM | POA: Diagnosis not present

## 2024-03-20 LAB — CBC
HCT: 26 % — ABNORMAL LOW (ref 36.0–46.0)
HCT: 27.3 % — ABNORMAL LOW (ref 36.0–46.0)
Hemoglobin: 8.6 g/dL — ABNORMAL LOW (ref 12.0–15.0)
Hemoglobin: 9.1 g/dL — ABNORMAL LOW (ref 12.0–15.0)
MCH: 29.7 pg (ref 26.0–34.0)
MCH: 29.5 pg (ref 26.0–34.0)
MCHC: 33.1 g/dL (ref 30.0–36.0)
MCHC: 33.3 g/dL (ref 30.0–36.0)
MCV: 89.7 fL (ref 80.0–100.0)
MCV: 88.6 fL (ref 80.0–100.0)
Platelets: 37 K/uL — ABNORMAL LOW (ref 150–400)
Platelets: 60 K/uL — ABNORMAL LOW (ref 150–400)
RBC: 2.9 MIL/uL — ABNORMAL LOW (ref 3.87–5.11)
RBC: 3.08 MIL/uL — ABNORMAL LOW (ref 3.87–5.11)
RDW: 16.1 % — ABNORMAL HIGH (ref 11.5–15.5)
RDW: 16 % — ABNORMAL HIGH (ref 11.5–15.5)
WBC: 11.6 K/uL — ABNORMAL HIGH (ref 4.0–10.5)
WBC: 14.7 K/uL — ABNORMAL HIGH (ref 4.0–10.5)
nRBC: 0.4 % — ABNORMAL HIGH (ref 0.0–0.2)
nRBC: 0.6 % — ABNORMAL HIGH (ref 0.0–0.2)

## 2024-03-20 LAB — BASIC METABOLIC PANEL WITH GFR
Anion gap: 8 (ref 5–15)
BUN: 7 mg/dL — ABNORMAL LOW (ref 8–23)
CO2: 29 mmol/L (ref 22–32)
Calcium: 11.1 mg/dL — ABNORMAL HIGH (ref 8.9–10.3)
Chloride: 99 mmol/L (ref 98–111)
Creatinine, Ser: 0.47 mg/dL (ref 0.44–1.00)
GFR, Estimated: >60 mL/min
Glucose, Bld: 114 mg/dL — ABNORMAL HIGH (ref 70–99)
Potassium: 3.2 mmol/L — ABNORMAL LOW (ref 3.5–5.1)
Sodium: 135 mmol/L (ref 135–145)

## 2024-03-20 LAB — GLUCOSE, CAPILLARY
Glucose-Capillary: 97 mg/dL (ref 70–99)
Glucose-Capillary: 106 mg/dL — ABNORMAL HIGH (ref 70–99)
Glucose-Capillary: 106 mg/dL — ABNORMAL HIGH (ref 70–99)
Glucose-Capillary: 96 mg/dL (ref 70–99)

## 2024-03-20 LAB — MAGNESIUM: Magnesium: 1.9 mg/dL (ref 1.7–2.4)

## 2024-03-20 MED ORDER — QUETIAPINE FUMARATE 25 MG PO TABS
12.5000 mg | ORAL_TABLET | Freq: Once | ORAL | Status: DC
  Filled 2024-03-20: qty 1

## 2024-03-20 MED ORDER — POTASSIUM CHLORIDE 10 MEQ/100ML IV SOLN
10.0000 meq | INTRAVENOUS | Status: AC
  Administered 2024-03-20 (×4): 10 meq via INTRAVENOUS
  Filled 2024-03-20 (×4): qty 100

## 2024-03-20 MED ORDER — SODIUM CHLORIDE 0.9% IV SOLUTION: Freq: Once | INTRAVENOUS | Status: AC

## 2024-03-20 MED ORDER — HALOPERIDOL LACTATE 5 MG/ML IJ SOLN
1.0000 mg | Freq: Once | INTRAMUSCULAR | Status: AC
  Administered 2024-03-21: 1 mg via INTRAMUSCULAR
  Filled 2024-03-20: qty 1

## 2024-03-20 MED ORDER — PANTOPRAZOLE SODIUM 40 MG PO TBEC
40.0000 mg | DELAYED_RELEASE_TABLET | Freq: Two times a day (BID) | ORAL | Status: DC
  Administered 2024-03-20 – 2024-03-26 (×13): 40 mg via ORAL
  Filled 2024-03-20 (×12): qty 1

## 2024-03-20 NOTE — TOC Progression Note (Addendum)
 Transition of Care [facility]) - Progression Note    Patient Details  Name: Anita Harrell MRN: [id number] Date of Birth: 1951/02/10  [facility]) CM/SW Contact  Anita JAYSON Carpen, LCSW Phone Number: 03/20/2024, 8:40 AM  Clinical Narrative: Appeal still under clinical review.    10:13 am: Patient now with increased confused. MD ordering labs and head CT as well as a sitter. [name] admissions coordinator is aware. CMA will send in updated MD note to [facility] when available so appeal can be cancelled. CSW updated patient's husband and son.  Expected Discharge Plan: Skilled Nursing Facility Barriers to Discharge: Continued Medical Work up               Expected Discharge Plan and Services In-house Referral: Clinical Social Work     Living arrangements for the past 2 months: Single Family Home Expected Discharge Date: 03/19/24                                     Social Drivers of Health (SDOH) Interventions SDOH Screenings   Food Insecurity: No Food Insecurity (03/15/2024)  Housing: Low Risk (03/15/2024)  Transportation Needs: No Transportation Needs (03/15/2024)  Utilities: Not At Risk (03/15/2024)  Alcohol Screen: Low Risk (07/27/2023)  Depression (PHQ2-9): High Risk (02/21/2024)  Financial Resource Strain: Low Risk  (09/19/2023)   Received from [facility]  Physical Activity: Inactive (07/27/2023)  Social Connections: Socially Integrated (03/15/2024)  Recent Concern: Social Connections - Moderately Isolated (12/16/2023)  Stress: No Stress Concern Present (07/27/2023)  Tobacco Use: Medium Risk (03/14/2024)  Health Literacy: Adequate Health Literacy (07/27/2023)    Readmission Risk Interventions     No data to display

## 2024-03-20 NOTE — Progress Notes (Signed)
 "  Hematology/Oncology Progress note [phone] [phone]     Patient Care Team: Tower, [name], MD as PCP - General (Family Medicine) Perla Evalene PARAS, MD as PCP - Cardiology (Cardiology) Rox Charleston, MD as Consulting Physician (Obstetrics and Gynecology) Tamea Dedra CROME, MD as Consulting Physician (Pulmonary Disease) Doles-Johnson, Teah, NP as Nurse Practitioner (Psychiatry) Fate Morna SAILOR, [facility] (Inactive) as Pharmacist (Pharmacist) Babara Call, MD as Consulting Physician (Oncology) Unk Corinn Skiff, MD as Consulting Physician (Gastroenterology)   Name of the patient: Anita Harrell  [id number]  1950/09/29  Date of visit: 03/20/2024   INTERVAL HISTORY-  Status post bone marrow biopsy. Overnight patient had mental status change.  CT showed Trace parafalcine subdural hematoma (2-3 mm) appears acute or subacute, without intracranial mass effect. No other acute intracranial abnormality Patient's son is at the bedside.  Allergies[1]  Patient Active Problem List   Diagnosis Date Noted   Thrombocytopenia 11/18/2022    Priority: High   Easy bruising 02/19/2024    Priority: Medium    Skin lesion 02/19/2024    Priority: Medium    Leukocytosis 03/30/2010    Priority: Medium    Compression fracture of body of thoracic vertebra (HCC) 03/17/2024   Occult blood in stools 03/17/2024   Other pancytopenia (HCC) 03/16/2024   Pressure injury of skin 03/16/2024   Toe injury, left, initial encounter 02/21/2024   Cognitive impairment 02/21/2024   Tremor 02/21/2024   Dysuria 01/29/2024   Moderately severe major depression (HCC) 01/29/2024   Multiple closed pelvic fractures with disruption of pelvic circle, initial encounter (HCC) 12/16/2023   Multiple closed pelvic fractures without disruption of pelvic circle (HCC) 12/15/2023   Closed fracture of head of left radius 12/15/2023   Closed fracture of left olecranon process 12/15/2023   Lumbar compression  fracture, closed, initial encounter (HCC) 12/15/2023   OSA (obstructive sleep apnea) 07/31/2023   Osteopenia 07/31/2023   COPD (chronic obstructive pulmonary disease) (HCC) 04/03/2023   Hypophosphatemia 11/19/2022   Metabolic acidosis 11/19/2022   Chronic heart failure with preserved ejection fraction (HFpEF) (HCC) 11/18/2022   Paroxysmal atrial fibrillation (HCC) 11/18/2022   Polypharmacy 05/17/2022   General weakness 02/16/2022   Poor balance 02/16/2022   Falling 02/16/2022   Hypokalemia 02/13/2022   HTN (hypertension) 02/13/2022   DDD (degenerative disc disease), lumbar 12/26/2021   Atrial fibrillation with RVR (HCC) 10/05/2021   Estrogen deficiency 06/07/2021   Overactive bladder 10/17/2019   Medicare annual wellness visit, subsequent 01/16/2019   Thyroid  nodule 05/01/2018   Electronic cigarette use 04/03/2018   Obesity (BMI 30-39.9) 04/03/2018   Lung mass 02/01/2018   Smokers' cough (HCC) 01/01/2018   Microscopic hematuria 08/13/2012   UTI (urinary tract infection) 07/22/2012   Routine general medical examination at a health care facility 07/22/2012   Prediabetes 02/21/2008   Allergic rhinitis 07/25/2007   Low back pain 06/26/2007   Personal history of goiter 07/27/2006   Hyperlipidemia 07/27/2006   PANIC DISORDER 07/27/2006   Former smoker 07/27/2006   Depression with anxiety 07/27/2006   Insomnia 07/27/2006   History of colonic polyps 06/09/2004     Past Medical History:  Diagnosis Date   Allergy    allergic rhinitis   Anxiety    Arrhythmia    atrial fibrillation   Arthritis    Back pain    Chest pain    CHF (congestive heart failure) (HCC)    COPD (chronic obstructive pulmonary disease) (HCC)    Depression    Emphysema of lung (HCC)  Epigastric pain    GERD (gastroesophageal reflux disease)    Goiter    History of tobacco abuse    Hyperglycemia    mild   Hyperlipidemia    Hypertension    Insomnia    Hx of   Labile blood pressure    Panic  disorder    History of   Personal history of colonic polyps 06/09/2004   SVD (spontaneous vaginal delivery)    x 2     Past Surgical History:  Procedure Laterality Date   ABDOMINAL HYSTERECTOMY N/A 09/24/2012   Procedure: HYSTERECTOMY ABDOMINAL;  Surgeon: Peggye Gull, MD;  Location: [facility];  Service: Gynecology;  Laterality: N/A;   BREAST BIOPSY Right 2016   CARDIOVERSION N/A 02/09/2022   Procedure: CARDIOVERSION;  Surgeon: Perla Evalene PARAS, MD;  Location: [facility];  Service: Cardiovascular;  Laterality: N/A;   CARDIOVERSION N/A 03/02/2022   Procedure: CARDIOVERSION;  Surgeon: Perla Evalene PARAS, MD;  Location: [facility];  Service: Cardiovascular;  Laterality: N/A;   COLONOSCOPY     COLONOSCOPY  2017   ELBOW SURGERY  2004   EYE SURGERY     bilateral lasik   IR BONE MARROW BIOPSY & ASPIRATION  03/18/2024   SALPINGOOPHORECTOMY Bilateral 09/24/2012   Procedure: SALPINGO OOPHORECTOMY;  Surgeon: Peggye Gull, MD;  Location: [facility];  Service: Gynecology;  Laterality: Bilateral;   THYROID  SURGERY     B9 massess   WART FULGURATION N/A 09/24/2012   Procedure: FULGURATION VAGINAL WART;  Surgeon: Peggye Gull, MD;  Location: [facility];  Service: Gynecology;  Laterality: N/A;   WISDOM TOOTH EXTRACTION      Social History   Socioeconomic History   Marital status: Married    Spouse name: Not on file   Number of children: Not on file   Years of education: Not on file   Highest education level: Not on file  Occupational History   Not on file  Tobacco Use   Smoking status: Former    Current packs/day: 0.00    Average packs/day: 1.0 packs/day    Types: E-cigarettes, Cigarettes    Quit date: 2011    Years since quitting: 15.0   Smokeless tobacco: Never  Vaping Use   Vaping status: Former   Quit date: 11/18/2022  Substance and Sexual Activity   Alcohol use: No    Alcohol/week: 0.0 standard drinks of alcohol   Drug use: No   Sexual activity: Yes    Birth control/protection: Post-menopausal   Other Topics Concern   Not on file  Social History Narrative   Lives with husband and 2 pets; dogs.   Social Drivers of Health   Tobacco Use: Medium Risk (03/14/2024)   Patient History    Smoking Tobacco Use: Former    Smokeless Tobacco Use: Never    Passive Exposure: Not on file  Financial Resource Strain: Low Risk  (09/19/2023)   Received from [facility]   Overall Financial Resource Strain (CARDIA)    Difficulty of Paying Living Expenses: Not hard at all  Food Insecurity: No Food Insecurity (03/15/2024)   Epic    Worried About Radiation Protection Practitioner of Food in the Last Year: Never true    Ran Out of Food in the Last Year: Never true  Transportation Needs: No Transportation Needs (03/15/2024)   Epic    Lack of Transportation (Medical): No    Lack of Transportation (Non-Medical): No  Physical Activity: Inactive (07/27/2023)   Exercise Vital Sign    Days of  Exercise per Week: 0 days    Minutes of Exercise per Session: 0 min  Stress: No Stress Concern Present (07/27/2023)   [employer] of Occupational Health - Occupational Stress Questionnaire    Feeling of Stress : Only a little  Social Connections: Socially Integrated (03/15/2024)   Social Connection and Isolation Panel    Frequency of Communication with Friends and Family: Twice a week    Frequency of Social Gatherings with Friends and Family: Twice a week    Attends Religious Services: More than 4 times per year    Active Member of [employer] or Organizations: Yes    Attends Banker Meetings: 1 to 4 times per year    Marital Status: Married  Recent Concern: Social Connections - Moderately Isolated (12/16/2023)   Social Connection and Isolation Panel    Frequency of Communication with Friends and Family: Three times a week    Frequency of Social Gatherings with Friends and Family: Once a week    Attends Religious Services: Never    Database Administrator or Organizations: No    Attends Banker  Meetings: Never    Marital Status: Married  Catering Manager Violence: Not At Risk (03/15/2024)   Epic    Fear of Current or Ex-Partner: No    Emotionally Abused: No    Physically Abused: No    Sexually Abused: No  Depression (PHQ2-9): High Risk (02/21/2024)   Depression (PHQ2-9)    PHQ-2 Score: 22  Alcohol Screen: Low Risk (07/27/2023)   Alcohol Screen    Last Alcohol Screening Score (AUDIT): 0  Housing: Low Risk (03/15/2024)   Epic    Unable to Pay for Housing in the Last Year: No    Number of Times Moved in the Last Year: 0    Homeless in the Last Year: No  Utilities: Not At Risk (03/15/2024)   Epic    Threatened with loss of utilities: No  Health Literacy: Adequate Health Literacy (07/27/2023)   B1300 Health Literacy    Frequency of need for help with medical instructions: Never     Family History  Problem Relation Age of Onset   Hypertension Mother    Stroke Mother    Alcohol abuse Father    Cancer Father        lung CA   Heart disease Father        CHF   Cancer Sister        breast CA   Heart disease Sister        CHF from chemotx also has defib   Breast cancer Sister    Colon cancer Neg Hx    Esophageal cancer Neg Hx    Rectal cancer Neg Hx    Stomach cancer Neg Hx     Current Medications[2]   Physical exam:  Vitals:   03/20/24 0952 03/20/24 1237 03/20/24 1541 03/20/24 1630  BP: 124/64 (!) 104/47 (!) 140/73 134/64  Pulse: 100 79 (!) 111 (!) 109  Resp:  18 16 18   Temp:   98.4 F (36.9 C)   TempSrc:    Oral  SpO2:  94% 93% 93%  Weight:      Height:       Physical Exam Constitutional:      General: She is not in acute distress.    Appearance: She is ill-appearing. She is not diaphoretic.  Eyes:     General: No scleral icterus. Cardiovascular:     Rate and Rhythm:  Normal rate.  Pulmonary:     Effort: Pulmonary effort is normal. No respiratory distress.  Abdominal:     General: There is no distension.  Musculoskeletal:        General: Normal range  of motion.  Skin:    Findings: No erythema.  Neurological:     Mental Status: She is alert.     Motor: No abnormal muscle tone.     Comments: Oriented x 2  Psychiatric:        Mood and Affect: Affect normal.       Labs    Latest Ref Rng & Units 03/20/2024   12:51 PM 03/19/2024    4:36 AM 03/18/2024    3:46 AM  CBC  WBC 4.0 - 10.5 K/uL 11.6  8.8  7.9   Hemoglobin 12.0 - 15.0 g/dL 8.6  8.4  8.6   Hematocrit 36.0 - 46.0 % 26.0  26.0  26.0   Platelets 150 - 400 K/uL 37  30  27       Latest Ref Rng & Units 03/20/2024   12:51 PM 03/19/2024    4:36 AM 03/18/2024    3:46 AM  CMP  Glucose 70 - 99 mg/dL 885  93  91   BUN 8 - 23 mg/dL 7  8  8    Creatinine 0.44 - 1.00 mg/dL 9.52  9.52  9.46   Sodium 135 - 145 mmol/L 135  138  137   Potassium 3.5 - 5.1 mmol/L 3.2  3.5  2.9   Chloride 98 - 111 mmol/L 99  102  100   CO2 22 - 32 mmol/L 29  26  28    Calcium  8.9 - 10.3 mg/dL 88.8  89.0  89.4      RADIOGRAPHIC STUDIES: I have personally reviewed the radiological images as listed and agreed with the findings in the report. CT HEAD WO CONTRAST ( ) Addendum Date: 03/20/2024 ADDENDUM #1  ADDENDUM: Study discussed by telephone with Doctor Tariq at 1243 hours on March 20, 2024 ---------------------------------------------------- Electronically signed by: Helayne Hurst MD MD 03/20/2024 01:03 PM EST RP Workstation: [id number]   Result Date: 03/20/2024  ORIGINAL REPORT  EXAM: CT HEAD WITHOUT CONTRAST 03/20/2024 10:11:57 AM TECHNIQUE: CT of the head was performed without the administration of intravenous contrast. Automated exposure control, iterative reconstruction, and/or weight based adjustment of the mA/kV was utilized to reduce the radiation dose to as low as reasonably achievable. COMPARISON: Brain MRI 09/25/2023, Head CT 12/15/2023. CLINICAL HISTORY: 74 year old female. Altered mental status, nontraumatic. Recently being evaluated for anemia and thrombocytopenia FINDINGS: BRAIN AND VENTRICLES: Brain  volume stable from last year, within normal limits for age. Gray white differentiation stable and within normal limits for age. New intermediate density thickening along the interhemispheric fissure (series 4 image 22) which is fairly extensive anterior to posterior but measures only 2 to 3 mm in thickness. This is most compatible with trace parafalcine subdural hematoma (series 2 image 27). No other intracranial hemorrhage identified. No evidence of acute infarct. No hydrocephalus. No extra-axial collection (except for the parafalcine subdural hematoma). No mass effect or midline shift. No suspicious intracranial vascular hyperdensity. Calcified atherosclerosis at the skull base. ORBITS: No acute abnormality. SINUSES: Widely scattered bilateral paranasal sinuses mucosal thickening is new with bubbly opacity. Tympanic cavities remain well aerated. Mild right mastoid effusion. Mild retained secretions in the visible nasopharynx. SOFT TISSUES AND SKULL: No acute soft tissue abnormality. No skull fracture. IMPRESSION: 1. Trace parafalcine subdural hematoma (2-3 mm) appears acute  or subacute, without intracranial mass effect. No other acute intracranial abnormality. 2. Bilateral paranasal sinus inflammation. Electronically signed by: Helayne Hurst MD MD 03/20/2024 12:23 PM EST RP Workstation: [id number]   IR BONE MARROW BIOPSY & ASPIRATION Result Date: 03/18/2024 INDICATION: 74 year old with anemia and thrombocytopenia. EXAM: FLUOROSCOPIC GUIDED BONE MARROW ASPIRATES AND BIOPSY Physician: Juliene SAUNDERS. Henn, MD MEDICATIONS: Moderate sedation ANESTHESIA/SEDATION: Moderate (conscious) sedation was employed during this procedure. A total of Versed  1 mg and fentanyl  50 mcg was administered intravenously at the order of the provider performing the procedure. Total intra-service moderate sedation time:  . Patient's level of consciousness and vital signs were monitored continuously by radiology nurse throughout the  procedure under the supervision of the provider performing the procedure. FLUOROSCOPY: Radiation Exposure Index (as provided by the fluoroscopic device): 3 mGy Kerma COMPLICATIONS: None immediate. PROCEDURE: The procedure was explained to the patient. The risks and benefits of the procedure were discussed and the patient's questions were addressed. Informed consent was obtained from the patient. The patient was placed prone on interventional table. The back was prepped and draped in sterile fashion. Maximal barrier sterile technique was utilized including caps, mask, sterile gowns, sterile gloves, sterile drape, hand hygiene and skin antiseptic. The skin and right posterior ilium were anesthetized with 1% lidocaine . 11 gauge bone needle was directed into the right ilium with fluoroscopic guidance. Two aspirates and 2 core biopsies were obtained. Bandage placed over the puncture site. Fluoroscopic image saved for documentation. IMPRESSION: Fluoroscopic guided bone marrow aspiration and core biopsy. Electronically Signed   By: Juliene Balder M.D.   On: 03/18/2024 11:23   CT CERVICAL SPINE WO CONTRAST Result Date: 03/15/2024 EXAM: CT CERVICAL SPINE WITHOUT CONTRAST 03/15/2024 02:22:42 AM TECHNIQUE: CT of the cervical spine was performed without the administration of intravenous contrast. Multiplanar reformatted images are provided for review. Automated exposure control, iterative reconstruction, and/or weight based adjustment of the mA/kV was utilized to reduce the radiation dose to as low as reasonably achievable. COMPARISON: None available. CLINICAL HISTORY: neck pain FINDINGS: BONES AND ALIGNMENT: No acute fracture or traumatic malalignment. DEGENERATIVE CHANGES: Mild degenerative disc disease without spinal canal stenosis. Multilevel facet arthrosis. No high-grade foraminal stenosis. SOFT TISSUES: No prevertebral soft tissue swelling. IMPRESSION: 1. Mild degenerative disc disease without spinal canal stenosis. 2.  Multilevel facet arthrosis without high-grade foraminal stenosis. Electronically signed by: [name] Stanford MD 03/15/2024 02:31 AM EST RP Workstation: [id number]   CT Angio Chest/Abd/Pel for Dissection W and/or Wo Contrast Result Date: 03/14/2024 EXAM: CTA CHEST, ABDOMEN AND PELVIS WITH AND WITHOUT CONTRAST 03/14/2024 08:35:25 PM TECHNIQUE: CTA of the chest was performed with and without the administration of intravenous contrast. CTA of the abdomen and pelvis was performed with and without the administration of intravenous contrast. Multiplanar reformatted images are provided for review. MIP images are provided for review. Automated exposure control, iterative reconstruction, and/or weight based adjustment of the mA/kV was utilized to reduce the radiation dose to as low as reasonably achievable. COMPARISON: 12/15/2023. CLINICAL HISTORY: weakness, back pain FINDINGS: VASCULATURE: Coronary artery and aortic atherosclerosis are present. AORTA: Aortic atherosclerosis is present. No acute finding. No abdominal aortic aneurysm. No dissection. PULMONARY ARTERIES: No pulmonary embolism with the limits of this exam. GREAT VESSELS OF AORTIC ARCH: No acute finding. No dissection. No arterial occlusion or significant stenosis. CELIAC TRUNK: No acute finding. No occlusion or significant stenosis. SUPERIOR MESENTERIC ARTERY: No acute finding. No occlusion or significant stenosis. INFERIOR MESENTERIC ARTERY: No acute finding. No occlusion or significant stenosis. RENAL  ARTERIES: No acute finding. No occlusion or significant stenosis. ILIAC ARTERIES: No acute finding. No occlusion or significant stenosis. CHEST: MEDIASTINUM: The heart is borderline in size. Coronary artery atherosclerosis is present. No mediastinal lymphadenopathy. The pericardium demonstrates no acute abnormality. LUNGS AND PLEURA: Dependent opacities in the lower lobes bilaterally, left greater than right. Favor atelectasis although pneumonia cannot be excluded  in the left lower lobe. No evidence of pleural effusion or pneumothorax. THORACIC BONES AND SOFT TISSUES: Healed posterior right lower rib fractures. These are unchanged since prior study. Mild depression through the inferior endplate of T6 and superior endplate of T8, new since prior study. ABDOMEN AND PELVIS: LIVER: The liver is unremarkable. GALLBLADDER AND BILE DUCTS: Gallbladder is unremarkable. No biliary ductal dilatation. SPLEEN: The spleen is unremarkable. PANCREAS: The pancreas is unremarkable. ADRENAL GLANDS: Bilateral adrenal glands demonstrate no acute abnormality. KIDNEYS, URETERS AND BLADDER: No stones in the kidneys or ureters. No hydronephrosis. No perinephric or periureteral stranding. Urinary bladder is unremarkable. GI AND BOWEL: Stomach and duodenal sweep demonstrate no acute abnormality. There is no bowel obstruction. No abnormal bowel wall thickening or distension. REPRODUCTIVE: Prior hysterectomy. No adnexal mass. PERITONEUM AND RETROPERITONEUM: No ascites or free air. LYMPH NODES: No lymphadenopathy. ABDOMINAL BONES AND SOFT TISSUES: Right old fracture noted at the superior and inferior left pubic rami and left sacrum. Chronic stable mild compression fracture at L5. No acute soft tissue abnormality. IMPRESSION: 1. Coronary artery, aortic atherosclerosis. No evidence of aneurysm or dissection. 2. New vertebral endplate compression fractures at T6 and T8 since prior study. 3. Dependent lower lobe opacities, left greater than right, favored atelectasis, although left lower lobe pneumonia is not excluded. Electronically signed by: [name] Crease MD 03/14/2024 09:29 PM EST RP Workstation: [id number]   DG Chest Portable 1 View Result Date: 03/14/2024 EXAM: 1 VIEW(S) XRAY OF THE CHEST 03/14/2024 07:31:00 PM COMPARISON: Comparison with 12/14/2023. CLINICAL HISTORY: weakness FINDINGS: LUNGS AND PLEURA: Shallow inspiration. Infiltration or atelectasis in the left base is new since the prior study.  Probable small left pleural effusion. No pneumothorax. HEART AND MEDIASTINUM: No acute abnormality of the cardiac and mediastinal silhouettes. BONES AND SOFT TISSUES: Postoperative changes in the base of the neck. Degenerative changes in the spine and shoulders. No acute osseous abnormality. IMPRESSION: 1. New left basilar infiltration or atelectasis and probable small left pleural effusion. Electronically signed by: Elsie Gravely MD 03/14/2024 07:42 PM EST RP Workstation: [id number]    Assessment and plan-   # Acute on chronic thrombocytopenia. Outpatient workup showed normal B12, folate, iron panel was not consistent with iron deficiency.  Multiple myeloma panel showed IgM lambda MGUS with M protein of 0.3.  LDH was elevated 404.  Negative peripheral blood flow cytometry.  Normal immature platelet fraction-indicating decreased bone marrow production. Inpatient workup showed normal haptoglobin, less likely hemolysis.  CT scan showed no lymphadenopathy. Reticulocyte count is slightly improving as UTI being treated.  Still persistently decreased. # Acute anemia.  Stable. Bone marrow biopsy results preliminary-marrow was replaced by unknown neoplasm.  Waiting for additional IHC staining for further evaluation. Results were reviewed and discussed with patient's son.  Incidental SDH on CT. Discussed with Dr. Cosette.  She gets 1 unit of platelet count for goal of platelet count of 50,000 repeat CT showed SDH is stable.  Continue supportive care.  Prognosis is likely poor.  Consider involving palliative care service in the near future for goals of care discussion.  Thank you for allowing me to participate in the care of  this patient.    Thank you for allowing me to participate in the care of this patient.   [name], MD, PhD Hematology Oncology 03/20/2024     [1]  Allergies Allergen Reactions   Abilify [Aripiprazole] Other (See Comments)    Vision problems   Ambien  [Zolpidem ] Other (See  Comments)    Hallucinations MS change    Codeine Nausea And Vomiting    Patient able to tolerate hydrocodone  and oxycodone    Lipitor [Atorvastatin] Other (See Comments)    Myalgias    Vesicare  [Solifenacin ] Other (See Comments)    MS change   Wellbutrin  [Bupropion ] Other (See Comments)    Unknown reaction  [2]  Current Facility-Administered Medications:    acetaminophen  (TYLENOL ) tablet 650 mg, 650 mg, Oral, Q6H PRN, Ponnala, Shruthi, MD   albuterol  (PROVENTIL ) (2.5 MG/3ML) 0.083% nebulizer solution 2.5 mg, 2.5 mg, Inhalation, Q6H PRN, Ponnala, Shruthi, MD   budesonide -glycopyrrolate -formoterol  (BREZTRI ) 160-9-4.8 MCG/ACT inhaler 2 puff, 2 puff, Inhalation, BID, Ponnala, Shruthi, MD, 2 puff at 03/20/24 0953   donepezil  (ARICEPT ) tablet 5 mg, 5 mg, Oral, Daily, Ponnala, Shruthi, MD, 5 mg at 03/20/24 0953   DULoxetine  (CYMBALTA ) DR capsule 120 mg, 120 mg, Oral, Daily, Jens Durand, MD, 120 mg at 03/20/24 0953   feeding supplement (ENSURE PLUS HIGH PROTEIN) liquid 237 mL, 237 mL, Oral, BID BM, Jens Durand, MD, 237 mL at 03/18/24 1059   flecainide  (TAMBOCOR ) tablet 100 mg, 100 mg, Oral, BID, Ponnala, Shruthi, MD, 100 mg at 03/20/24 9047   haloperidol  lactate (HALDOL ) injection 1 mg, 1 mg, Intramuscular, Once, Tariq, Hassan, MD   hydrOXYzine  (ATARAX ) tablet 25 mg, 25 mg, Oral, BID, Ponnala, Shruthi, MD, 25 mg at 03/20/24 0952   melatonin tablet 10 mg, 10 mg, Oral, QHS, Ponnala, Shruthi, MD, 10 mg at 03/19/24 2145   metoprolol  succinate (TOPROL -XL) 24 hr tablet 50 mg, 50 mg, Oral, Daily, Ayiku, Bernard, MD, 50 mg at 03/20/24 9047   oxyCODONE  (Oxy IR/ROXICODONE ) immediate release tablet 2.5 mg, 2.5 mg, Oral, Q4H PRN, Ponnala, Shruthi, MD, 2.5 mg at 03/16/24 1802   pantoprazole  (PROTONIX ) EC tablet 40 mg, 40 mg, Oral, BID AC, Tariq, Hassan, MD   polyethylene glycol (MIRALAX  / GLYCOLAX ) packet 17 g, 17 g, Oral, Daily PRN, Tariq, Hassan, MD, 17 g at 03/19/24 1652   potassium chloride  10 mEq in  100 mL IVPB, 10 mEq, Intravenous, Q1 Hr x 4, Tariq, Hassan, MD   rosuvastatin  (CRESTOR ) tablet 10 mg, 10 mg, Oral, Daily, Ponnala, Shruthi, MD, 10 mg at 03/20/24 9047   sodium chloride  flush (NS) 0.9 % injection 3 mL, 3 mL, Intravenous, Q12H, Ponnala, Shruthi, MD, 3 mL at 03/19/24 2145  "

## 2024-03-20 NOTE — Progress Notes (Signed)
 " PROGRESS NOTE    Anita Harrell  FMW:[id number] DOB: [birth year] DOA: 03/14/2024 PCP: Randeen [name], MD  Subjective: No acute events overnight. Informed by nursing of acute agitation with inability to be redirected this morning. Seen and examined at bedside with son and 1:1 sitter present. Unable to provide much history of present illness due to altered mentation. Able to answer simple questions and follow commands.    Hospital Course:  74 y.o. female  with medical history significant of chronic HFpEF, paroxysmal A-fib, HLD, dementia, depression, and generalized anxiety disorder, chronic neck and back pain, OSA, who presented to the ED because of general weakness, increasing confusion, neck and back pain.  He has chronic neck and back pain/lumbar radiculopathy and recently got steroid injection on 03/03/2024.  Since discharge from the rehab facility about a week before Thanksgiving, patient's condition has declined.  She has gotten weaker and weaker and she has been intermittently confused and confusion is typically worse in the evenings.  Husband and family has become increasingly difficult to take care of her at home.  She is to be able to ambulate with a walker.  She is now unable to ambulate.  Recently got 2 rounds of antibiotics for UTI.  She was prescribed 5 days of nitrofurantoin by her neurologist on 01/24/2024.  She was given second round of antibiotics with Keflex  on 02/21/2024.  Urine culture from 02/21/2024 showed pansensitive E. coli.    Admitted for recurrent UTI treated with IV ceftriaxone  then transitioned to amoxicillin  to finish a total 5 day course on 03/19/24.   Assessment and Plan:  Acute encephalopathy Atraumatic tiny subdural hematoma Hx of dementia Etiology unclear, possibly related to underlying dementia  Less likely due to the very tiny subdural hematoma CT head with tiny incidental SDH Given no recent head trauma, it is unclear if the SDH resulted due to acute  thrombocytopenia See management of thrombocytopenia as elsewhere Cont melatonin at bedtime Cont hydroxyzine  BID and duloxetine  Monitor oral intake, I/Os, Bms, sleep routine Plan to get repeat CT head in 24 hours to assess stability of SDH Monitor on fall, delirium, aspiration precautions  Bicytopenia  Acute on chronic anemia  Acute thrombocytopenia.  Etiology remains unclear.  No signs/symptoms of acute bleed.  Blood counts are low but stable.  Status post bone marrow biopsy on 03/19/23.  GI recommended outpatient follow up for colonoscopy  - stopped home eliquis  - will give 1 unit platelets given tiny SDH - transfuse for Hgb goal > 7, platelet goal > 50K (given SDH) - hematology following - given referral to follow up with GI clinic for possible colonoscopy - given referral to follow up with hematology/oncology to follow up on bone biopsy results  Recurrent UTI  S. Epidermidis bloodstream contamination - treated with IV ceftriaxone  then transitioned to amoxicillin  to finish a total 5 day course on 03/19/24.   Hypotension  - resolved with IV hydration and stopped home diuresis.  - follow up with PCP and cardiology for resumption timing given history of heart failure. Euvolemic at present    Thoracic compression fractures at T6 and T8. - Seen by neurosurgery with recommendation for conservative management and TLSO brace for comfort.  - Patient declined TLSO brace.  - Seen by PT/OT during admission. Will need SNF    Chronic hypoxic respiratory failure.  - Continue on home 2-3 L oxygen .  - Wean as tolerated  DVT prophylaxis: SCDs Start: 03/15/24 0143  SCDs   Code Status: Limited: Do  not attempt resuscitation (DNR) -DNR-LIMITED -Do Not Intubate/DNI  Family Communication: updated son at bedside Disposition Plan: SNF Reason for continuing need for hospitalization: platelet transfusion, repeat CT head pending, hematology recommendations  Objective: Vitals:   03/20/24 0505  03/20/24 0949 03/20/24 0952 03/20/24 1237  BP: (!) 100/50 124/67 124/64 (!) 104/47  Pulse: 76 100 100 79  Resp: 20   18  Temp: 97.8 F (36.6 C) 98.4 F (36.9 C)    TempSrc:  Oral    SpO2: (!) 82% 91%  94%  Weight:      Height:        Intake/Output Summary (Last 24 hours) at 03/20/2024 1401 Last data filed at 03/20/2024 1024 Gross per 24 hour  Intake 3 ml  Output 900 ml  Net -897 ml   Filed Weights   03/14/24 1626  Weight: 73 kg    Examination:  Physical Exam Vitals and nursing note reviewed.  Constitutional:      General: She is not in acute distress.    Appearance: She is ill-appearing.  HENT:     Head: Normocephalic and atraumatic.  Cardiovascular:     Rate and Rhythm: Normal rate and regular rhythm.     Pulses: Normal pulses.     Heart sounds: Normal heart sounds.  Pulmonary:     Effort: Pulmonary effort is normal.     Breath sounds: Normal breath sounds.  Abdominal:     General: Bowel sounds are normal. There is no distension.     Palpations: Abdomen is soft.     Tenderness: There is no abdominal tenderness.  Neurological:     Mental Status: She is alert.     Data Reviewed: I have personally reviewed following labs and imaging studies  CBC: Recent Labs  Lab 03/16/24 0425 03/17/24 0419 03/18/24 0346 03/19/24 0436 03/20/24 1251  WBC 9.6 9.2 7.9 8.8 11.6*  HGB 9.1* 8.9* 8.6* 8.4* 8.6*  HCT 28.1* 27.2* 26.0* 26.0* 26.0*  MCV 90.9 90.7 90.6 91.2 89.7  PLT 32* 29* 27* 30* 37*   Basic Metabolic Panel: Recent Labs  Lab 03/16/24 0425 03/17/24 0419 03/18/24 0346 03/19/24 0436 03/20/24 1251  NA 140 140 137 138 135  K 3.6 3.5 2.9* 3.5 3.2*  CL 104 104 100 102 99  CO2 25 27 28 26 29   GLUCOSE 109* 87 91 93 114*  BUN 5* 8 8 8  7*  CREATININE 0.46 0.57 0.53 0.47 0.47  CALCIUM  10.1 10.9* 10.5* 10.9* 11.1*  MG 1.8  --  1.9 2.0  --   PHOS  --   --  4.4  --   --    GFR: Estimated Creatinine Clearance: 58.1 mL/min (by C-G formula based on SCr of 0.47  mg/dL). Liver Function Tests: Recent Labs  Lab 03/14/24 1621 03/18/24 0346  AST 40  --   ALT 10  --   ALKPHOS 119  --   BILITOT 0.5  --   PROT 5.0*  --   ALBUMIN  2.5* 2.4*   No results for input(s): LIPASE, AMYLASE in the last 168 hours. No results for input(s): AMMONIA in the last 168 hours. Coagulation Profile: Recent Labs  Lab 03/14/24 1621  INR 1.5*   Cardiac Enzymes: No results for input(s): CKTOTAL, CKMB, CKMBINDEX, TROPONINI in the last 168 hours. ProBNP, BNP (last 5 results) Recent Labs    03/15/24 0823  PROBNP 1,237.0*   HbA1C: No results for input(s): HGBA1C in the last 72 hours. CBG: Recent Labs  Lab 03/19/24  1955 03/19/24 2103 03/19/24 2326 03/20/24 0519 03/20/24 1240  GLUCAP 90 82 86 97 106*   Lipid Profile: No results for input(s): CHOL, HDL, LDLCALC, TRIG, CHOLHDL, LDLDIRECT in the last 72 hours. Thyroid  Function Tests: No results for input(s): TSH, T4TOTAL, FREET4, T3FREE, THYROIDAB in the last 72 hours. Anemia Panel: No results for input(s): VITAMINB12, FOLATE, FERRITIN, TIBC, IRON, RETICCTPCT in the last 72 hours. Sepsis Labs: Recent Labs  Lab 03/14/24 1927  LATICACIDVEN 1.8    Recent Results (from the past 240 hours)  Urine Culture     Status: Abnormal   Collection Time: 03/14/24  4:21 PM   Specimen: Urine, Clean Catch  Result Value Ref Range Status   Specimen Description   Final    URINE, CLEAN CATCH Performed at [facility], [address], [place], [place] [address]    Special Requests   Final    NONE Performed at [facility], [address] [place], [place] [address]    Culture >=100,000 COLONIES/mL ESCHERICHIA COLI (A)  Final   Report Status 03/17/2024 FINAL  Final   Organism ID, Bacteria ESCHERICHIA COLI (A)  Final      Susceptibility   Escherichia coli - MIC*    AMPICILLIN <=2 SENSITIVE Sensitive     CEFAZOLIN  (URINE) Value in next row Sensitive       <=1 SENSITIVEThis is a modified FDA-approved test that has been validated and its performance characteristics determined by the reporting laboratory.  This laboratory is certified under the Clinical Laboratory Improvement Amendments CLIA as qualified to perform high complexity clinical laboratory testing.    CEFEPIME Value in next row Sensitive      <=1 SENSITIVEThis is a modified FDA-approved test that has been validated and its performance characteristics determined by the reporting laboratory.  This laboratory is certified under the Clinical Laboratory Improvement Amendments CLIA as qualified to perform high complexity clinical laboratory testing.    ERTAPENEM Value in next row Sensitive      <=1 SENSITIVEThis is a modified FDA-approved test that has been validated and its performance characteristics determined by the reporting laboratory.  This laboratory is certified under the Clinical Laboratory Improvement Amendments CLIA as qualified to perform high complexity clinical laboratory testing.    CEFTRIAXONE  Value in next row Sensitive      <=1 SENSITIVEThis is a modified FDA-approved test that has been validated and its performance characteristics determined by the reporting laboratory.  This laboratory is certified under the Clinical Laboratory Improvement Amendments CLIA as qualified to perform high complexity clinical laboratory testing.    CIPROFLOXACIN  Value in next row Sensitive      <=1 SENSITIVEThis is a modified FDA-approved test that has been validated and its performance characteristics determined by the reporting laboratory.  This laboratory is certified under the Clinical Laboratory Improvement Amendments CLIA as qualified to perform high complexity clinical laboratory testing.    GENTAMICIN Value in next row Sensitive      <=1 SENSITIVEThis is a modified FDA-approved test that has been validated and its performance characteristics determined by the reporting laboratory.  This  laboratory is certified under the Clinical Laboratory Improvement Amendments CLIA as qualified to perform high complexity clinical laboratory testing.    NITROFURANTOIN Value in next row Sensitive      <=1 SENSITIVEThis is a modified FDA-approved test that has been validated and its performance characteristics determined by the reporting laboratory.  This laboratory is certified under the Clinical Laboratory Improvement Amendments CLIA as qualified to perform  high complexity clinical laboratory testing.    TRIMETH /SULFA  Value in next row Sensitive      <=1 SENSITIVEThis is a modified FDA-approved test that has been validated and its performance characteristics determined by the reporting laboratory.  This laboratory is certified under the Clinical Laboratory Improvement Amendments CLIA as qualified to perform high complexity clinical laboratory testing.    AMPICILLIN/SULBACTAM Value in next row Sensitive      <=1 SENSITIVEThis is a modified FDA-approved test that has been validated and its performance characteristics determined by the reporting laboratory.  This laboratory is certified under the Clinical Laboratory Improvement Amendments CLIA as qualified to perform high complexity clinical laboratory testing.    PIP/TAZO Value in next row Sensitive      <=4 SENSITIVEThis is a modified FDA-approved test that has been validated and its performance characteristics determined by the reporting laboratory.  This laboratory is certified under the Clinical Laboratory Improvement Amendments CLIA as qualified to perform high complexity clinical laboratory testing.    MEROPENEM Value in next row Sensitive      <=4 SENSITIVEThis is a modified FDA-approved test that has been validated and its performance characteristics determined by the reporting laboratory.  This laboratory is certified under the Clinical Laboratory Improvement Amendments CLIA as qualified to perform high complexity clinical laboratory testing.     * >=100,000 COLONIES/mL ESCHERICHIA COLI  Blood culture (routine x 2)     Status: Abnormal   Collection Time: 03/14/24  7:14 PM   Specimen: BLOOD  Result Value Ref Range Status   Specimen Description   Final    BLOOD LEFT ANTECUBITAL Performed at [facility], [address] [place], [place] [address]    Special Requests   Final    BOTTLES DRAWN AEROBIC AND ANAEROBIC Blood Culture results may not be optimal due to an inadequate volume of blood received in culture bottles Performed at [facility], [address] [place], [place] [address]    Culture  Setup Time   Final    GRAM POSITIVE COCCI AEROBIC BOTTLE ONLY Organism ID to follow CRITICAL RESULT CALLED TO, READ BACK BY AND VERIFIED WITH: JASON ROBBINS @ 03/15/2024 2140 AB Performed at [facility], [address] [place], [place] [address]    Culture (A)  Final    STAPHYLOCOCCUS EPIDERMIDIS THE SIGNIFICANCE OF ISOLATING THIS ORGANISM FROM A SINGLE VENIPUNCTURE CANNOT BE PREDICTED WITHOUT FURTHER CLINICAL AND CULTURE CORRELATION. SUSCEPTIBILITIES AVAILABLE ONLY ON REQUEST. Performed at [facility], [address], [place], [place] [address]    Report Status 03/18/2024 FINAL  Final  Blood Culture ID Panel (Reflexed)     Status: Abnormal   Collection Time: 03/14/24  7:14 PM  Result Value Ref Range Status   Enterococcus faecalis NOT DETECTED NOT DETECTED Final   Enterococcus Faecium NOT DETECTED NOT DETECTED Final   Listeria monocytogenes NOT DETECTED NOT DETECTED Final   Staphylococcus species DETECTED (A) NOT DETECTED Final    Comment: CRITICAL RESULT CALLED TO, READ BACK BY AND VERIFIED WITH: JASON ROBBINS @ 03/15/2024 2140 AB    Staphylococcus aureus (BCID) NOT DETECTED NOT DETECTED Final   Staphylococcus epidermidis DETECTED (A) NOT DETECTED Final    Comment: Methicillin (oxacillin) resistant coagulase negative staphylococcus. Possible blood culture contaminant (unless isolated from  more than one blood culture draw or clinical case suggests pathogenicity). No antibiotic treatment is indicated for blood  culture contaminants. CRITICAL RESULT CALLED TO, READ BACK BY AND VERIFIED WITH: JASON ROBBINS @ 03/15/2024 2140  AB    Staphylococcus lugdunensis NOT DETECTED NOT DETECTED Final   Streptococcus species NOT DETECTED NOT DETECTED Final   Streptococcus agalactiae NOT DETECTED NOT DETECTED Final   Streptococcus pneumoniae NOT DETECTED NOT DETECTED Final   Streptococcus pyogenes NOT DETECTED NOT DETECTED Final   A.calcoaceticus-baumannii NOT DETECTED NOT DETECTED Final   Bacteroides fragilis NOT DETECTED NOT DETECTED Final   Enterobacterales NOT DETECTED NOT DETECTED Final   Enterobacter cloacae complex NOT DETECTED NOT DETECTED Final   Escherichia coli NOT DETECTED NOT DETECTED Final   Klebsiella aerogenes NOT DETECTED NOT DETECTED Final   Klebsiella oxytoca NOT DETECTED NOT DETECTED Final   Klebsiella pneumoniae NOT DETECTED NOT DETECTED Final   Proteus species NOT DETECTED NOT DETECTED Final   Salmonella species NOT DETECTED NOT DETECTED Final   Serratia marcescens NOT DETECTED NOT DETECTED Final   Haemophilus influenzae NOT DETECTED NOT DETECTED Final   Neisseria meningitidis NOT DETECTED NOT DETECTED Final   Pseudomonas aeruginosa NOT DETECTED NOT DETECTED Final   Stenotrophomonas maltophilia NOT DETECTED NOT DETECTED Final   Candida albicans NOT DETECTED NOT DETECTED Final   Candida auris NOT DETECTED NOT DETECTED Final   Candida glabrata NOT DETECTED NOT DETECTED Final   Candida krusei NOT DETECTED NOT DETECTED Final   Candida parapsilosis NOT DETECTED NOT DETECTED Final   Candida tropicalis NOT DETECTED NOT DETECTED Final   Cryptococcus neoformans/gattii NOT DETECTED NOT DETECTED Final   Methicillin resistance mecA/C DETECTED (A) NOT DETECTED Final    Comment: CRITICAL RESULT CALLED TO, READ BACK BY AND VERIFIED WITH: JASON ROBBINS @ 03/15/2024 2140  AB Performed at [facility], [address] [place], [place] [address]      Radiology Studies: CT HEAD WO CONTRAST ( ) Addendum Date: 03/20/2024 ADDENDUM #1 : Study discussed by telephone with Doctor Juliett Eastburn at 1243 hours on March 20, 2024 ---------------------------------------------------- Electronically signed by: Helayne Hurst MD MD 03/20/2024 01:03 PM EST RP Workstation: [id number]   Result Date: 03/20/2024 ORIGINAL REPORT EXAM: CT HEAD WITHOUT CONTRAST 03/20/2024 10:11:57 AM TECHNIQUE: CT of the head was performed without the administration of intravenous contrast. Automated exposure control, iterative reconstruction, and/or weight based adjustment of the mA/kV was utilized to reduce the radiation dose to as low as reasonably achievable. COMPARISON: Brain MRI 09/25/2023, Head CT 12/15/2023. CLINICAL HISTORY: 74 year old female. Altered mental status, nontraumatic. Recently being evaluated for anemia and thrombocytopenia FINDINGS: BRAIN AND VENTRICLES: Brain volume stable from last year, within normal limits for age. Gray white differentiation stable and within normal limits for age. New intermediate density thickening along the interhemispheric fissure (series 4 image 22) which is fairly extensive anterior to posterior but measures only 2 to 3 mm in thickness. This is most compatible with trace parafalcine subdural hematoma (series 2 image 27). No other intracranial hemorrhage identified. No evidence of acute infarct. No hydrocephalus. No extra-axial collection (except for the parafalcine subdural hematoma). No mass effect or midline shift. No suspicious intracranial vascular hyperdensity. Calcified atherosclerosis at the skull base. ORBITS: No acute abnormality. SINUSES: Widely scattered bilateral paranasal sinuses mucosal thickening is new with bubbly opacity. Tympanic cavities remain well aerated. Mild right mastoid effusion. Mild retained secretions in the visible nasopharynx. SOFT  TISSUES AND SKULL: No acute soft tissue abnormality. No skull fracture. IMPRESSION: 1. Trace parafalcine subdural hematoma (2-3 mm) appears acute or subacute, without intracranial mass effect. No other acute intracranial abnormality. 2. Bilateral paranasal sinus inflammation. Electronically signed by: Helayne Hurst MD MD 03/20/2024 12:23 PM EST RP Workstation: [id number]  Scheduled Meds:  budesonide -glycopyrrolate -formoterol   2 puff Inhalation BID   donepezil   5 mg Oral Daily   DULoxetine   120 mg Oral Daily   feeding supplement  237 mL Oral BID BM   flecainide   100 mg Oral BID   hydrOXYzine   25 mg Oral BID   melatonin  10 mg Oral QHS   metoprolol  succinate  50 mg Oral Daily   pantoprazole   40 mg Oral BID AC   rosuvastatin   10 mg Oral Daily   sodium chloride  flush  3 mL Intravenous Q12H   Continuous Infusions:   LOS: 6 days   Norval Bar, MD  [facility]  03/20/2024, 2:01 PM   "

## 2024-03-20 NOTE — Plan of Care (Signed)
  Problem: Clinical Measurements: Goal: Ability to maintain clinical measurements within normal limits will improve Outcome: Progressing   Problem: Elimination: Goal: Will not experience complications related to bowel motility Outcome: Progressing   Problem: Nutrition: Goal: Adequate nutrition will be maintained Outcome: Progressing   Problem: Safety: Goal: Ability to remain free from injury will improve Outcome: Progressing

## 2024-03-20 NOTE — Plan of Care (Signed)

## 2024-03-21 ENCOUNTER — Inpatient Hospital Stay

## 2024-03-21 DIAGNOSIS — R4182 Altered mental status, unspecified: Secondary | ICD-10-CM | POA: Diagnosis not present

## 2024-03-21 DIAGNOSIS — N3000 Acute cystitis without hematuria: Secondary | ICD-10-CM | POA: Diagnosis not present

## 2024-03-21 DIAGNOSIS — R569 Unspecified convulsions: Secondary | ICD-10-CM

## 2024-03-21 LAB — GLUCOSE, CAPILLARY
Glucose-Capillary: 86 mg/dL (ref 70–99)
Glucose-Capillary: 78 mg/dL (ref 70–99)
Glucose-Capillary: 91 mg/dL (ref 70–99)
Glucose-Capillary: 87 mg/dL (ref 70–99)
Glucose-Capillary: 77 mg/dL (ref 70–99)

## 2024-03-21 LAB — CBC
HCT: 23.1 % — ABNORMAL LOW (ref 36.0–46.0)
HCT: 23.5 % — ABNORMAL LOW (ref 36.0–46.0)
Hemoglobin: 7.6 g/dL — ABNORMAL LOW (ref 12.0–15.0)
Hemoglobin: 7.9 g/dL — ABNORMAL LOW (ref 12.0–15.0)
MCH: 29.6 pg (ref 26.0–34.0)
MCH: 30.3 pg (ref 26.0–34.0)
MCHC: 32.9 g/dL (ref 30.0–36.0)
MCHC: 33.6 g/dL (ref 30.0–36.0)
MCV: 89.9 fL (ref 80.0–100.0)
MCV: 90 fL (ref 80.0–100.0)
Platelets: 48 K/uL — ABNORMAL LOW (ref 150–400)
Platelets: 77 K/uL — ABNORMAL LOW (ref 150–400)
RBC: 2.57 MIL/uL — ABNORMAL LOW (ref 3.87–5.11)
RBC: 2.61 MIL/uL — ABNORMAL LOW (ref 3.87–5.11)
RDW: 16.2 % — ABNORMAL HIGH (ref 11.5–15.5)
RDW: 16.1 % — ABNORMAL HIGH (ref 11.5–15.5)
WBC: 10.8 K/uL — ABNORMAL HIGH (ref 4.0–10.5)
WBC: 14.6 K/uL — ABNORMAL HIGH (ref 4.0–10.5)
nRBC: 0.6 % — ABNORMAL HIGH (ref 0.0–0.2)
nRBC: 1 % — ABNORMAL HIGH (ref 0.0–0.2)

## 2024-03-21 LAB — BASIC METABOLIC PANEL WITH GFR
Anion gap: 10 (ref 5–15)
BUN: 8 mg/dL (ref 8–23)
CO2: 27 mmol/L (ref 22–32)
Calcium: 10.3 mg/dL (ref 8.9–10.3)
Chloride: 101 mmol/L (ref 98–111)
Creatinine, Ser: 0.51 mg/dL (ref 0.44–1.00)
GFR, Estimated: >60 mL/min
Glucose, Bld: 83 mg/dL (ref 70–99)
Potassium: 3.9 mmol/L (ref 3.5–5.1)
Sodium: 138 mmol/L (ref 135–145)

## 2024-03-21 LAB — PREPARE PLATELET PHERESIS: Unit division: 0

## 2024-03-21 LAB — BPAM PLATELET PHERESIS
Blood Product Expiration Date: 202601112359
ISSUE DATE / TIME: 202601081627
Unit Type and Rh: 5100

## 2024-03-21 LAB — SURGICAL PATHOLOGY

## 2024-03-21 LAB — RETIC PANEL
Immature Retic Fract: 32.7 % — ABNORMAL HIGH (ref 2.3–15.9)
RBC.: 2.57 MIL/uL — ABNORMAL LOW (ref 3.87–5.11)
Retic Count, Absolute: 44.2 K/uL (ref 19.0–186.0)
Retic Ct Pct: 1.7 % (ref 0.4–3.1)
Reticulocyte Hemoglobin: 31.7 pg

## 2024-03-21 MED ORDER — SODIUM CHLORIDE 0.9% IV SOLUTION: Freq: Once | INTRAVENOUS | Status: AC

## 2024-03-21 NOTE — Progress Notes (Signed)
 PT Cancellation Note  Patient Details Name: MIYOSHI LIGAS MRN: [id number] DOB: 07-Feb-1951   Cancelled Treatment:    Reason Eval/Treat Not Completed: Patient at procedure or test/unavailable (Patient off the floor at this time. Discussed with bedside nurse. Will continue to follow.)  Randine Essex, PT, MPT  Randine LULLA Essex 03/21/2024, 11:45 AM

## 2024-03-21 NOTE — Consult Note (Signed)
 WOC Nurse Consult Note: Reason for Consult: multiple wounds  Wound type: 1.  Stage 3 pressure Injury sacrum  80% red 20% tan  2.  Full thickness R breast unknown etiology 50% red 50% tan  Pressure Injury POA: Yes, presented as a deep tissue pressure injury on admission (see photo 03/15/2024)  Measurement: see nursing flowsheet  Wound bed: as above  Drainage (amount, consistency, odor) see nursing flowsheet  Periwound: mild erythema around breast wound  Dressing procedure/placement/frequency: Cleanse sacral wound with Vashe, do not rinse. Apply silver  hydrofiber Soila [phone]) cut to fit wound bed daily and secure with silicone foam. Lift foam daily to reapply silver , change foam q3 days and prn soiling.  Cleanse R breast wound with Vashe, do not rinse.  Apply Xeroform gauze ([place] [phone]) to wound bed daily and secure with silicone foam or dry gauze and clothe tape whichever is preferred.  POC discussed with bedside nurse.  WOC team will not follow. Reconsult if further needs arise.   Thank you,    Powell Bar MSN, RN-BC, [employer]

## 2024-03-21 NOTE — Progress Notes (Signed)
 Eeg done

## 2024-03-21 NOTE — Progress Notes (Signed)
 " PROGRESS NOTE    ANNETT BOXWELL  FMW:[id number] DOB: October 12, 1950 DOA: 03/14/2024 PCP: Randeen [name], MD  Subjective: No acute events overnight. Seen and examined at bedside with son present after CT head. Worried about the safety of her cat at home. Able to answer simple questions and follow commands. Unable to provide much history due to altered mentation.   Hospital Course:  74 y.o. female  with medical history significant of chronic HFpEF, paroxysmal A-fib, HLD, dementia, depression, and generalized anxiety disorder, chronic neck and back pain, OSA, who presented to the ED because of general weakness, increasing confusion, neck and back pain.  He has chronic neck and back pain/lumbar radiculopathy and recently got steroid injection on 03/03/2024.  Since discharge from the rehab facility about a week before Thanksgiving, patient's condition has declined.  She has gotten weaker and weaker and she has been intermittently confused and confusion is typically worse in the evenings.  Husband and family has become increasingly difficult to take care of her at home.  She is to be able to ambulate with a walker.  She is now unable to ambulate.  Recently got 2 rounds of antibiotics for UTI.  She was prescribed 5 days of nitrofurantoin by her neurologist on 01/24/2024.  She was given second round of antibiotics with Keflex  on 02/21/2024.  Urine culture from 02/21/2024 showed pansensitive E. coli.    Admitted for recurrent UTI treated with IV ceftriaxone  then transitioned to amoxicillin  to finish a total 5 day course on 03/19/24.   Assessment and Plan:  Acute encephalopathy Atraumatic tiny subdural hematoma Hx of dementia Recurrent in nature Etiology unclear, possibly related to underlying dementia  Less likely due to the very tiny subdural hematoma CT head with tiny incidental SDH Given no recent head trauma, it is unclear if the SDH resulted due to acute thrombocytopenia EEG with encephalopathy  without seizures or epileptiform abnormalities  See management of thrombocytopenia as elsewhere Cont melatonin at bedtime Cont hydroxyzine  BID and duloxetine  Monitor oral intake, I/Os, Bms, sleep routine Repeat CT head in 24 hours pending  Monitor on fall, delirium, aspiration precautions   Bicytopenia  Acute on chronic anemia  Acute thrombocytopenia Etiology remains unclear, concern for malignancy  No signs/symptoms of acute bleed.  Hgb 7.6 < 9.1, baseline 11-12s Platelets 48 < 60, baseline 60-100s  Status post bone marrow biopsy on 03/19/23, prelim read of marrow replaced by neoplasm, final read pending GI recommended outpatient follow up for colonoscopy  - stopped home eliquis  - will give another 1 unit platelets  - transfuse for Hgb goal > 7, platelet goal > 50K (given acute SDH) - hematology following - given referral to follow up with GI clinic for possible colonoscopy   Recurrent UTI  S. Epidermidis bloodstream contamination - treated with IV ceftriaxone  then transitioned to amoxicillin  to finish a total 5 day course on 03/19/24.   Hypotension  - resolved with IV hydration and stopped home diuresis.  - follow up with PCP and cardiology for resumption timing given history of heart failure. Euvolemic at present    Thoracic compression fractures at T6 and T8. - Seen by neurosurgery with recommendation for conservative management and TLSO brace for comfort.  - Patient declined TLSO brace.  - Seen by PT/OT during admission. Will need SNF    Chronic hypoxic respiratory failure.  - Continue on home 2-3 L oxygen .  - Wean as tolerated  DVT prophylaxis: SCDs Start: 03/15/24 0143  SCDs   Code Status:  Limited: Do not attempt resuscitation (DNR) -DNR-LIMITED -Do Not Intubate/DNI  Family Communication: updated son at bedside Disposition Plan: SNF Reason for continuing need for hospitalization: monitor blood counts, repeat CT head results pending, biopsy results pending, hematology  recommendations  Objective: Vitals:   03/21/24 0018 03/21/24 0450 03/21/24 0906 03/21/24 1334  BP: (!) 105/48 (!) 117/51 (!) 110/51 (!) 94/50  Pulse: 76 76 72 81  Resp: 16 18 16 16   Temp: 97.6 F (36.4 C) 98.1 F (36.7 C) 97.8 F (36.6 C) 98 F (36.7 C)  TempSrc:      SpO2: 94% 95% 90% 100%  Weight:      Height:        Intake/Output Summary (Last 24 hours) at 03/21/2024 1402 Last data filed at 03/20/2024 2000 Gross per 24 hour  Intake 490 ml  Output --  Net 490 ml   Filed Weights   03/14/24 1626  Weight: 73 kg    Examination:  Physical Exam Vitals and nursing note reviewed.  Constitutional:      General: She is not in acute distress.    Appearance: She is ill-appearing (chronically).     Comments: Weak, frail  HENT:     Head: Normocephalic and atraumatic.  Cardiovascular:     Rate and Rhythm: Normal rate and regular rhythm.     Pulses: Normal pulses.     Heart sounds: Normal heart sounds.  Pulmonary:     Effort: Pulmonary effort is normal.     Breath sounds: Normal breath sounds.  Abdominal:     General: Bowel sounds are normal.     Palpations: Abdomen is soft.  Neurological:     Mental Status: She is alert. She is disoriented.     Data Reviewed: I have personally reviewed following labs and imaging studies  CBC: Recent Labs  Lab 03/18/24 0346 03/19/24 0436 03/20/24 1251 03/20/24 2017 03/21/24 0438  WBC 7.9 8.8 11.6* 14.7* 10.8*  HGB 8.6* 8.4* 8.6* 9.1* 7.6*  HCT 26.0* 26.0* 26.0* 27.3* 23.1*  MCV 90.6 91.2 89.7 88.6 89.9  PLT 27* 30* 37* 60* 48*   Basic Metabolic Panel: Recent Labs  Lab 03/16/24 0425 03/17/24 0419 03/18/24 0346 03/19/24 0436 03/20/24 1251 03/20/24 2017 03/21/24 0438  NA 140 140 137 138 135  --  138  K 3.6 3.5 2.9* 3.5 3.2*  --  3.9  CL 104 104 100 102 99  --  101  CO2 25 27 28 26 29   --  27  GLUCOSE 109* 87 91 93 114*  --  83  BUN 5* 8 8 8  7*  --  8  CREATININE 0.46 0.57 0.53 0.47 0.47  --  0.51  CALCIUM  10.1 10.9*  10.5* 10.9* 11.1*  --  10.3  MG 1.8  --  1.9 2.0  --  1.9  --   PHOS  --   --  4.4  --   --   --   --    GFR: Estimated Creatinine Clearance: 58.1 mL/min (by C-G formula based on SCr of 0.51 mg/dL). Liver Function Tests: Recent Labs  Lab 03/14/24 1621 03/18/24 0346  AST 40  --   ALT 10  --   ALKPHOS 119  --   BILITOT 0.5  --   PROT 5.0*  --   ALBUMIN  2.5* 2.4*   No results for input(s): LIPASE, AMYLASE in the last 168 hours. No results for input(s): AMMONIA in the last 168 hours. Coagulation Profile: Recent Labs  Lab  03/14/24 1621  INR 1.5*   Cardiac Enzymes: No results for input(s): CKTOTAL, CKMB, CKMBINDEX, TROPONINI in the last 168 hours. ProBNP, BNP (last 5 results) Recent Labs    03/15/24 0823  PROBNP 1,237.0*   HbA1C: No results for input(s): HGBA1C in the last 72 hours. CBG: Recent Labs  Lab 03/20/24 1957 03/21/24 0113 03/21/24 0449 03/21/24 0943 03/21/24 1243  GLUCAP 96 86 78 91 87   Lipid Profile: No results for input(s): CHOL, HDL, LDLCALC, TRIG, CHOLHDL, LDLDIRECT in the last 72 hours. Thyroid  Function Tests: No results for input(s): TSH, T4TOTAL, FREET4, T3FREE, THYROIDAB in the last 72 hours. Anemia Panel: No results for input(s): VITAMINB12, FOLATE, FERRITIN, TIBC, IRON, RETICCTPCT in the last 72 hours. Sepsis Labs: Recent Labs  Lab 03/14/24 1927  LATICACIDVEN 1.8    Recent Results (from the past 240 hours)  Urine Culture     Status: Abnormal   Collection Time: 03/14/24  4:21 PM   Specimen: Urine, Clean Catch  Result Value Ref Range Status   Specimen Description   Final    URINE, CLEAN CATCH Performed at [facility], [address] [place], [place] [address]    Special Requests   Final    NONE Performed at [facility], [address] [place], [place] [address]    Culture >=100,000 COLONIES/mL ESCHERICHIA COLI (A)  Final   Report Status 03/17/2024 FINAL   Final   Organism ID, Bacteria ESCHERICHIA COLI (A)  Final      Susceptibility   Escherichia coli - MIC*    AMPICILLIN <=2 SENSITIVE Sensitive     CEFAZOLIN  (URINE) Value in next row Sensitive      <=1 SENSITIVEThis is a modified FDA-approved test that has been validated and its performance characteristics determined by the reporting laboratory.  This laboratory is certified under the Clinical Laboratory Improvement Amendments CLIA as qualified to perform high complexity clinical laboratory testing.    CEFEPIME Value in next row Sensitive      <=1 SENSITIVEThis is a modified FDA-approved test that has been validated and its performance characteristics determined by the reporting laboratory.  This laboratory is certified under the Clinical Laboratory Improvement Amendments CLIA as qualified to perform high complexity clinical laboratory testing.    ERTAPENEM Value in next row Sensitive      <=1 SENSITIVEThis is a modified FDA-approved test that has been validated and its performance characteristics determined by the reporting laboratory.  This laboratory is certified under the Clinical Laboratory Improvement Amendments CLIA as qualified to perform high complexity clinical laboratory testing.    CEFTRIAXONE  Value in next row Sensitive      <=1 SENSITIVEThis is a modified FDA-approved test that has been validated and its performance characteristics determined by the reporting laboratory.  This laboratory is certified under the Clinical Laboratory Improvement Amendments CLIA as qualified to perform high complexity clinical laboratory testing.    CIPROFLOXACIN  Value in next row Sensitive      <=1 SENSITIVEThis is a modified FDA-approved test that has been validated and its performance characteristics determined by the reporting laboratory.  This laboratory is certified under the Clinical Laboratory Improvement Amendments CLIA as qualified to perform high complexity clinical laboratory testing.    GENTAMICIN  Value in next row Sensitive      <=1 SENSITIVEThis is a modified FDA-approved test that has been validated and its performance characteristics determined by the reporting laboratory.  This laboratory is certified under the Clinical Laboratory Improvement Amendments CLIA as qualified to  perform high complexity clinical laboratory testing.    NITROFURANTOIN Value in next row Sensitive      <=1 SENSITIVEThis is a modified FDA-approved test that has been validated and its performance characteristics determined by the reporting laboratory.  This laboratory is certified under the Clinical Laboratory Improvement Amendments CLIA as qualified to perform high complexity clinical laboratory testing.    TRIMETH /SULFA  Value in next row Sensitive      <=1 SENSITIVEThis is a modified FDA-approved test that has been validated and its performance characteristics determined by the reporting laboratory.  This laboratory is certified under the Clinical Laboratory Improvement Amendments CLIA as qualified to perform high complexity clinical laboratory testing.    AMPICILLIN/SULBACTAM Value in next row Sensitive      <=1 SENSITIVEThis is a modified FDA-approved test that has been validated and its performance characteristics determined by the reporting laboratory.  This laboratory is certified under the Clinical Laboratory Improvement Amendments CLIA as qualified to perform high complexity clinical laboratory testing.    PIP/TAZO Value in next row Sensitive      <=4 SENSITIVEThis is a modified FDA-approved test that has been validated and its performance characteristics determined by the reporting laboratory.  This laboratory is certified under the Clinical Laboratory Improvement Amendments CLIA as qualified to perform high complexity clinical laboratory testing.    MEROPENEM Value in next row Sensitive      <=4 SENSITIVEThis is a modified FDA-approved test that has been validated and its performance characteristics determined  by the reporting laboratory.  This laboratory is certified under the Clinical Laboratory Improvement Amendments CLIA as qualified to perform high complexity clinical laboratory testing.    * >=100,000 COLONIES/mL ESCHERICHIA COLI  Blood culture (routine x 2)     Status: Abnormal   Collection Time: 03/14/24  7:14 PM   Specimen: BLOOD  Result Value Ref Range Status   Specimen Description   Final    BLOOD LEFT ANTECUBITAL Performed at [facility], [address] [place], [place] [address]    Special Requests   Final    BOTTLES DRAWN AEROBIC AND ANAEROBIC Blood Culture results may not be optimal due to an inadequate volume of blood received in culture bottles Performed at [facility], [address] [place], [place] [address]    Culture  Setup Time   Final    GRAM POSITIVE COCCI AEROBIC BOTTLE ONLY Organism ID to follow CRITICAL RESULT CALLED TO, READ BACK BY AND VERIFIED WITH: JASON ROBBINS @ 03/15/2024 2140 AB Performed at [facility], [address] [place], [place] [address]    Culture (A)  Final    STAPHYLOCOCCUS EPIDERMIDIS THE SIGNIFICANCE OF ISOLATING THIS ORGANISM FROM A SINGLE VENIPUNCTURE CANNOT BE PREDICTED WITHOUT FURTHER CLINICAL AND CULTURE CORRELATION. SUSCEPTIBILITIES AVAILABLE ONLY ON REQUEST. Performed at [facility], [address] [place], [place] [address]    Report Status 03/18/2024 FINAL  Final  Blood Culture ID Panel (Reflexed)     Status: Abnormal   Collection Time: 03/14/24  7:14 PM  Result Value Ref Range Status   Enterococcus faecalis NOT DETECTED NOT DETECTED Final   Enterococcus Faecium NOT DETECTED NOT DETECTED Final   Listeria monocytogenes NOT DETECTED NOT DETECTED Final   Staphylococcus species DETECTED (A) NOT DETECTED Final    Comment: CRITICAL RESULT CALLED TO, READ BACK BY AND VERIFIED WITH: JASON ROBBINS @ 03/15/2024 2140 AB    Staphylococcus aureus (BCID) NOT DETECTED NOT DETECTED Final    Staphylococcus epidermidis DETECTED (A)  NOT DETECTED Final    Comment: Methicillin (oxacillin) resistant coagulase negative staphylococcus. Possible blood culture contaminant (unless isolated from more than one blood culture draw or clinical case suggests pathogenicity). No antibiotic treatment is indicated for blood  culture contaminants. CRITICAL RESULT CALLED TO, READ BACK BY AND VERIFIED WITH: JASON ROBBINS @ 03/15/2024 2140 AB    Staphylococcus lugdunensis NOT DETECTED NOT DETECTED Final   Streptococcus species NOT DETECTED NOT DETECTED Final   Streptococcus agalactiae NOT DETECTED NOT DETECTED Final   Streptococcus pneumoniae NOT DETECTED NOT DETECTED Final   Streptococcus pyogenes NOT DETECTED NOT DETECTED Final   A.calcoaceticus-baumannii NOT DETECTED NOT DETECTED Final   Bacteroides fragilis NOT DETECTED NOT DETECTED Final   Enterobacterales NOT DETECTED NOT DETECTED Final   Enterobacter cloacae complex NOT DETECTED NOT DETECTED Final   Escherichia coli NOT DETECTED NOT DETECTED Final   Klebsiella aerogenes NOT DETECTED NOT DETECTED Final   Klebsiella oxytoca NOT DETECTED NOT DETECTED Final   Klebsiella pneumoniae NOT DETECTED NOT DETECTED Final   Proteus species NOT DETECTED NOT DETECTED Final   Salmonella species NOT DETECTED NOT DETECTED Final   Serratia marcescens NOT DETECTED NOT DETECTED Final   Haemophilus influenzae NOT DETECTED NOT DETECTED Final   Neisseria meningitidis NOT DETECTED NOT DETECTED Final   Pseudomonas aeruginosa NOT DETECTED NOT DETECTED Final   Stenotrophomonas maltophilia NOT DETECTED NOT DETECTED Final   Candida albicans NOT DETECTED NOT DETECTED Final   Candida auris NOT DETECTED NOT DETECTED Final   Candida glabrata NOT DETECTED NOT DETECTED Final   Candida krusei NOT DETECTED NOT DETECTED Final   Candida parapsilosis NOT DETECTED NOT DETECTED Final   Candida tropicalis NOT DETECTED NOT DETECTED Final   Cryptococcus neoformans/gattii NOT DETECTED  NOT DETECTED Final   Methicillin resistance mecA/C DETECTED (A) NOT DETECTED Final    Comment: CRITICAL RESULT CALLED TO, READ BACK BY AND VERIFIED WITH: JASON ROBBINS @ 03/15/2024 2140 AB Performed at [facility], [address] [place], [place] [address]      Radiology Studies: EEG adult Result Date: 03/21/2024 Shelton Arlin KIDD, MD     03/21/2024 12:36 PM Patient Name: COURTENAY HIRTH MRN: [id number] Epilepsy Attending: Arlin KIDD Shelton Referring Physician/Provider: Cosette Blackwater, MD Date: 03/21/2024 Duration: 25.36 mins Patient history: 74yo F with ams. EEG to evaluate for seizure. Level of alertness: Awake, asleep AEDs during EEG study: None Technical aspects: This EEG study was done with scalp electrodes positioned according to the 10-20 International system of electrode placement. Electrical activity was reviewed with band pass filter of 1-70Hz , sensitivity of 7 uV/mm, display speed of 45mm/sec with a 60Hz  notched filter applied as appropriate. EEG data were recorded continuously and digitally stored.  Video monitoring was available and reviewed as appropriate. Description: No clear posterior dominant rhythm was seen. Sleep was characterized by sleep spindles (12 to 14 Hz), maximal frontocentral region. There is continuous generalized 3 to 6 Hz theta-delta slowing. Hyperventilation and photic stimulation were not performed.   ABNORMALITY - Continuous slow, generalized IMPRESSION: This study is suggestive of generalized cerebral dysfunction (encephalopathy). No seizures or epileptiform discharges were seen throughout the recording. Priyanka KIDD Shelton   CT HEAD WO CONTRAST ( ) Addendum Date: 03/20/2024 ADDENDUM #1: Study discussed by telephone with Doctor Sofiah Lyne at 1243 hours on March 20, 2024 ---------------------------------------------------- Electronically signed by: Helayne Hurst MD MD 03/20/2024 01:03 PM EST RP Workstation: [id number]   Result Date: 03/20/2024 ORIGINAL REPORT EXAM: CT  HEAD WITHOUT CONTRAST 03/20/2024 10:11:57 AM TECHNIQUE: CT of  the head was performed without the administration of intravenous contrast. Automated exposure control, iterative reconstruction, and/or weight based adjustment of the mA/kV was utilized to reduce the radiation dose to as low as reasonably achievable. COMPARISON: Brain MRI 09/25/2023, Head CT 12/15/2023. CLINICAL HISTORY: 74 year old female. Altered mental status, nontraumatic. Recently being evaluated for anemia and thrombocytopenia FINDINGS: BRAIN AND VENTRICLES: Brain volume stable from last year, within normal limits for age. Gray white differentiation stable and within normal limits for age. New intermediate density thickening along the interhemispheric fissure (series 4 image 22) which is fairly extensive anterior to posterior but measures only 2 to 3 mm in thickness. This is most compatible with trace parafalcine subdural hematoma (series 2 image 27). No other intracranial hemorrhage identified. No evidence of acute infarct. No hydrocephalus. No extra-axial collection (except for the parafalcine subdural hematoma). No mass effect or midline shift. No suspicious intracranial vascular hyperdensity. Calcified atherosclerosis at the skull base. ORBITS: No acute abnormality. SINUSES: Widely scattered bilateral paranasal sinuses mucosal thickening is new with bubbly opacity. Tympanic cavities remain well aerated. Mild right mastoid effusion. Mild retained secretions in the visible nasopharynx. SOFT TISSUES AND SKULL: No acute soft tissue abnormality. No skull fracture. IMPRESSION: 1. Trace parafalcine subdural hematoma (2-3 mm) appears acute or subacute, without intracranial mass effect. No other acute intracranial abnormality. 2. Bilateral paranasal sinus inflammation. Electronically signed by: Helayne Hurst MD MD 03/20/2024 12:23 PM EST RP Workstation: [id number]    Scheduled Meds:  budesonide -glycopyrrolate -formoterol   2 puff Inhalation BID    donepezil   5 mg Oral Daily   DULoxetine   120 mg Oral Daily   feeding supplement  237 mL Oral BID BM   flecainide   100 mg Oral BID   hydrOXYzine   25 mg Oral BID   melatonin  10 mg Oral QHS   metoprolol  succinate  50 mg Oral Daily   pantoprazole   40 mg Oral BID AC   rosuvastatin   10 mg Oral Daily   Continuous Infusions:   LOS: 7 days   Norval Bar, MD  [facility]  03/21/2024, 2:02 PM   "

## 2024-03-21 NOTE — Procedures (Signed)
 Patient Name: Anita Harrell  MRN: [id number]  Epilepsy Attending: Arlin MALVA Krebs  Referring Physician/Provider: Cosette Blackwater, MD  Date: 03/21/2024 Duration: 25.36 mins  Patient history: 74yo F with ams. EEG to evaluate for seizure.  Level of alertness: Awake, asleep  AEDs during EEG study: None  Technical aspects: This EEG study was done with scalp electrodes positioned according to the 10-20 International system of electrode placement. Electrical activity was reviewed with band pass filter of 1-70Hz , sensitivity of 7 uV/mm, display speed of 79mm/sec with a 60Hz  notched filter applied as appropriate. EEG data were recorded continuously and digitally stored.  Video monitoring was available and reviewed as appropriate.  Description: No clear posterior dominant rhythm was seen. Sleep was characterized by sleep spindles (12 to 14 Hz), maximal frontocentral region. There is continuous generalized 3 to 6 Hz theta-delta slowing. Hyperventilation and photic stimulation were not performed.     ABNORMALITY - Continuous slow, generalized  IMPRESSION: This study is suggestive of generalized cerebral dysfunction (encephalopathy). No seizures or epileptiform discharges were seen throughout the recording.  Debraann Livingstone O Asna Muldrow

## 2024-03-21 NOTE — Plan of Care (Signed)

## 2024-03-22 DIAGNOSIS — C801 Malignant (primary) neoplasm, unspecified: Secondary | ICD-10-CM

## 2024-03-22 DIAGNOSIS — D649 Anemia, unspecified: Secondary | ICD-10-CM | POA: Diagnosis not present

## 2024-03-22 DIAGNOSIS — N3000 Acute cystitis without hematuria: Secondary | ICD-10-CM | POA: Diagnosis not present

## 2024-03-22 DIAGNOSIS — D696 Thrombocytopenia, unspecified: Secondary | ICD-10-CM | POA: Diagnosis not present

## 2024-03-22 LAB — GLUCOSE, CAPILLARY
Glucose-Capillary: 108 mg/dL — ABNORMAL HIGH (ref 70–99)
Glucose-Capillary: 122 mg/dL — ABNORMAL HIGH (ref 70–99)
Glucose-Capillary: 64 mg/dL — ABNORMAL LOW (ref 70–99)
Glucose-Capillary: 76 mg/dL (ref 70–99)
Glucose-Capillary: 80 mg/dL (ref 70–99)
Glucose-Capillary: 80 mg/dL (ref 70–99)

## 2024-03-22 LAB — BPAM PLATELET PHERESIS
Blood Product Expiration Date: 202601122359
ISSUE DATE / TIME: 202601091348
Unit Type and Rh: 5100

## 2024-03-22 LAB — PREPARE PLATELET PHERESIS: Unit division: 0

## 2024-03-22 LAB — CBC
HCT: 24 % — ABNORMAL LOW (ref 36.0–46.0)
Hemoglobin: 7.9 g/dL — ABNORMAL LOW (ref 12.0–15.0)
MCH: 29.8 pg (ref 26.0–34.0)
MCHC: 32.9 g/dL (ref 30.0–36.0)
MCV: 90.6 fL (ref 80.0–100.0)
Platelets: 69 K/uL — ABNORMAL LOW (ref 150–400)
RBC: 2.65 MIL/uL — ABNORMAL LOW (ref 3.87–5.11)
RDW: 16.4 % — ABNORMAL HIGH (ref 11.5–15.5)
WBC: 12.2 K/uL — ABNORMAL HIGH (ref 4.0–10.5)
nRBC: 1.2 % — ABNORMAL HIGH (ref 0.0–0.2)

## 2024-03-22 NOTE — Progress Notes (Signed)
 I had phone discussion with both patient's son Debby as well as patient's husband Anita Harrell.  Anita Harrell is patient's power of attorney.  Patient has baseline cognitive impairment and needs help with ADLs. 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 Discussed with family members that patient has aggressive hematological neoplasm which requires aggressive treatments not available in our [facility] setting.  Bone marrow biopsy results need further clarification as treatments for NK cell leukemia or BPDCN are different.  Option of second opinion at tertiary center like [facility] or [facility] if family desires aggressive treatments. Patient is likely not a good candidate to go through these treatment.  Husband and son appreciate discussion and would like to further discuss.  They will update clinical team.  Meanwhile they agree with continuing supportive care and involve palliative care service.

## 2024-03-22 NOTE — Plan of Care (Signed)

## 2024-03-22 NOTE — Progress Notes (Addendum)
 " PROGRESS NOTE    Anita Harrell  FMW:[id number] DOB: [birth year] DOA: 03/14/2024 PCP: Randeen [name], MD  Subjective: No acute events overnight. Seen and examined at bedside with son, and daughter-in-law present. Patient concerned that she is dying in the hospital and apologetic about intermittent behavioral disturbances over the past two days. Denies any nausea, vomiting, constipation.  Hospital Course:  74 y.o. female  with medical history significant of chronic HFpEF, paroxysmal A-fib, HLD, dementia, depression, and generalized anxiety disorder, chronic neck and back pain, OSA, who presented to the ED because of general weakness, increasing confusion, neck and back pain.  He has chronic neck and back pain/lumbar radiculopathy and recently got steroid injection on 03/03/2024.  Since discharge from the rehab facility about a week before Thanksgiving, patient's condition has declined.  She has gotten weaker and weaker and she has been intermittently confused and confusion is typically worse in the evenings.  Husband and family has become increasingly difficult to take care of her at home.  She is to be able to ambulate with a walker.  She is now unable to ambulate.  Recently got 2 rounds of antibiotics for UTI.  She was prescribed 5 days of nitrofurantoin by her neurologist on 01/24/2024.  She was given second round of antibiotics with Keflex  on 02/21/2024.  Urine culture from 02/21/2024 showed pansensitive E. coli.    Admitted for recurrent UTI treated with IV ceftriaxone  then transitioned to amoxicillin  to finish a total 5 day course on 03/19/24.   Assessment and Plan:  Bicytopenia  Acute on chronic anemia  Acute thrombocytopenia Etiology remains unclear, concern for malignancy  No signs/symptoms of acute bleed.  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 Babara who stated patient likely not a good candidate for any treatment given co-morbidities GI recommended outpatient follow up for colonoscopy  - stopped home eliquis  - transfuse for Hgb goal > 7, platelet goal > 50K (given acute SDH) - hematology following - given referral to follow up with GI clinic for possible colonoscopy - will get palliative team consult, awaiting recs  Acute encephalopathy Atraumatic tiny subdural hematoma Hx of dementia Waxing and waning in nature Etiology unclear, possibly related to underlying dementia  Less likely due to the very tiny subdural hematoma CT head with tiny incidental SDH Repeat CT head in 24 hours with decrease in SDH Given no recent head trauma, it is unclear if the SDH resulted due to acute thrombocytopenia EEG with encephalopathy without seizures or epileptiform abnormalities  See management of thrombocytopenia as elsewhere Cont melatonin at bedtime Cont hydroxyzine  BID and duloxetine  Monitor oral intake, I/Os, Bms, sleep routine Monitor on fall, delirium, aspiration precautions   Recurrent UTI  S. Epidermidis bloodstream contamination - treated with IV ceftriaxone  then transitioned to amoxicillin  to finish a total 5 day course on 03/19/24.   Hypotension  - resolved with IV hydration and stopped home diuresis.  - follow up with PCP and cardiology for resumption timing given history of heart failure. Euvolemic at present    Thoracic compression fractures at T6 and T8. - Seen by neurosurgery with recommendation for conservative management and TLSO brace for comfort.  - Patient declined TLSO brace.  - Seen by PT/OT during admission. Will need SNF    Chronic hypoxic respiratory failure.  - Continue on home 2-3 L  oxygen .  - Wean as tolerated  Advance care planning - updated patient and family about the new aggressive hematologic malignancy diagnosis and concerns she may not be a good  candidate for aggressive treatment given co-morbidities. Gave patient and family time to think and discuss amongst each other about the condition and poor prognosis. Informed that we will be getting a palliative care team consult for assistance on this matter too. I spent a total of 15 minutes discussing goals of care with patient/family at bedside today.  DVT prophylaxis: SCDs Start: 03/15/24 0143  SCDs   Code Status: Limited: Do not attempt resuscitation (DNR) -DNR-LIMITED -Do Not Intubate/DNI  Family Communication: updated son at bedside Disposition Plan: TBD Reason for continuing need for hospitalization: severity of illness, monitor blood counts, palliative consult pending  Objective: Vitals:   03/21/24 1700 03/21/24 2041 03/22/24 0458 03/22/24 0915  BP: 122/68 (!) 122/51 (!) 127/56 (!) 130/95  Pulse: 74 77 70 80  Resp: (!) 21 17 19 17   Temp: 97.8 F (36.6 C) 97.7 F (36.5 C) 97.9 F (36.6 C) (!) 97.3 F (36.3 C)  TempSrc: Oral     SpO2: 100% 94% 98% 96%  Weight:      Height:        Intake/Output Summary (Last 24 hours) at 03/22/2024 1142 Last data filed at 03/22/2024 1039 Gross per 24 hour  Intake 771.67 ml  Output 1300 ml  Net -528.33 ml   Filed Weights   03/14/24 1626  Weight: 73 kg    Examination:  Physical Exam Vitals and nursing note reviewed.  Constitutional:      General: She is not in acute distress.    Appearance: She is ill-appearing.     Comments: Weak, frail  HENT:     Head: Normocephalic and atraumatic.  Cardiovascular:     Rate and Rhythm: Normal rate and regular rhythm.     Pulses: Normal pulses.     Heart sounds: Normal heart sounds.  Pulmonary:     Effort: Pulmonary effort is normal.     Breath sounds: Normal breath sounds.  Abdominal:     General: Bowel sounds are normal.     Palpations: Abdomen is soft.  Neurological:     Mental Status: She is alert. Mental status is at baseline.     Data Reviewed: I have personally reviewed  following labs and imaging studies  CBC: Recent Labs  Lab 03/20/24 1251 03/20/24 2017 03/21/24 0438 03/21/24 2013 03/22/24 0503  WBC 11.6* 14.7* 10.8* 14.6* 12.2*  HGB 8.6* 9.1* 7.6* 7.9* 7.9*  HCT 26.0* 27.3* 23.1* 23.5* 24.0*  MCV 89.7 88.6 89.9 90.0 90.6  PLT 37* 60* 48* 77* 69*   Basic Metabolic Panel: Recent Labs  Lab 03/16/24 0425 03/17/24 0419 03/18/24 0346 03/19/24 0436 03/20/24 1251 03/20/24 2017 03/21/24 0438  NA 140 140 137 138 135  --  138  K 3.6 3.5 2.9* 3.5 3.2*  --  3.9  CL 104 104 100 102 99  --  101  CO2 25 27 28 26 29   --  27  GLUCOSE 109* 87 91 93 114*  --  83  BUN 5* 8 8 8  7*  --  8  CREATININE 0.46 0.57 0.53 0.47 0.47  --  0.51  CALCIUM  10.1 10.9* 10.5* 10.9* 11.1*  --  10.3  MG 1.8  --  1.9 2.0  --  1.9  --   PHOS  --   --  4.4  --   --   --   --  GFR: Estimated Creatinine Clearance: 58.1 mL/min (by C-G formula based on SCr of 0.51 mg/dL). Liver Function Tests: Recent Labs  Lab 03/18/24 0346  ALBUMIN  2.4*   No results for input(s): LIPASE, AMYLASE in the last 168 hours. No results for input(s): AMMONIA in the last 168 hours. Coagulation Profile: No results for input(s): INR, PROTIME in the last 168 hours. Cardiac Enzymes: No results for input(s): CKTOTAL, CKMB, CKMBINDEX, TROPONINI in the last 168 hours. ProBNP, BNP (last 5 results) Recent Labs    03/15/24 0823  PROBNP 1,237.0*   HbA1C: No results for input(s): HGBA1C in the last 72 hours. CBG: Recent Labs  Lab 03/21/24 1243 03/21/24 2043 03/22/24 0033 03/22/24 0458 03/22/24 0828  GLUCAP 87 77 80 64* 80   Lipid Profile: No results for input(s): CHOL, HDL, LDLCALC, TRIG, CHOLHDL, LDLDIRECT in the last 72 hours. Thyroid  Function Tests: No results for input(s): TSH, T4TOTAL, FREET4, T3FREE, THYROIDAB in the last 72 hours. Anemia Panel: Recent Labs    03/21/24 0438  RETICCTPCT 1.7   Sepsis Labs: No results for input(s):  PROCALCITON, LATICACIDVEN in the last 168 hours.  Recent Results (from the past 240 hours)  Urine Culture     Status: Abnormal   Collection Time: 03/14/24  4:21 PM   Specimen: Urine, Clean Catch  Result Value Ref Range Status   Specimen Description   Final    URINE, CLEAN CATCH Performed at [facility], [address], [place], [place] [address]    Special Requests   Final    NONE Performed at [facility], [address] [place], [place] [address]    Culture >=100,000 COLONIES/mL ESCHERICHIA COLI (A)  Final   Report Status 03/17/2024 FINAL  Final   Organism ID, Bacteria ESCHERICHIA COLI (A)  Final      Susceptibility   Escherichia coli - MIC*    AMPICILLIN <=2 SENSITIVE Sensitive     CEFAZOLIN  (URINE) Value in next row Sensitive      <=1 SENSITIVEThis is a modified FDA-approved test that has been validated and its performance characteristics determined by the reporting laboratory.  This laboratory is certified under the Clinical Laboratory Improvement Amendments CLIA as qualified to perform high complexity clinical laboratory testing.    CEFEPIME Value in next row Sensitive      <=1 SENSITIVEThis is a modified FDA-approved test that has been validated and its performance characteristics determined by the reporting laboratory.  This laboratory is certified under the Clinical Laboratory Improvement Amendments CLIA as qualified to perform high complexity clinical laboratory testing.    ERTAPENEM Value in next row Sensitive      <=1 SENSITIVEThis is a modified FDA-approved test that has been validated and its performance characteristics determined by the reporting laboratory.  This laboratory is certified under the Clinical Laboratory Improvement Amendments CLIA as qualified to perform high complexity clinical laboratory testing.    CEFTRIAXONE  Value in next row Sensitive      <=1 SENSITIVEThis is a modified FDA-approved test that has been validated and its  performance characteristics determined by the reporting laboratory.  This laboratory is certified under the Clinical Laboratory Improvement Amendments CLIA as qualified to perform high complexity clinical laboratory testing.    CIPROFLOXACIN  Value in next row Sensitive      <=1 SENSITIVEThis is a modified FDA-approved test that has been validated and its performance characteristics determined by the reporting laboratory.  This laboratory is certified under the Clinical Laboratory Improvement Amendments CLIA as qualified to perform high  complexity clinical laboratory testing.    GENTAMICIN Value in next row Sensitive      <=1 SENSITIVEThis is a modified FDA-approved test that has been validated and its performance characteristics determined by the reporting laboratory.  This laboratory is certified under the Clinical Laboratory Improvement Amendments CLIA as qualified to perform high complexity clinical laboratory testing.    NITROFURANTOIN Value in next row Sensitive      <=1 SENSITIVEThis is a modified FDA-approved test that has been validated and its performance characteristics determined by the reporting laboratory.  This laboratory is certified under the Clinical Laboratory Improvement Amendments CLIA as qualified to perform high complexity clinical laboratory testing.    TRIMETH /SULFA  Value in next row Sensitive      <=1 SENSITIVEThis is a modified FDA-approved test that has been validated and its performance characteristics determined by the reporting laboratory.  This laboratory is certified under the Clinical Laboratory Improvement Amendments CLIA as qualified to perform high complexity clinical laboratory testing.    AMPICILLIN/SULBACTAM Value in next row Sensitive      <=1 SENSITIVEThis is a modified FDA-approved test that has been validated and its performance characteristics determined by the reporting laboratory.  This laboratory is certified under the Clinical Laboratory Improvement  Amendments CLIA as qualified to perform high complexity clinical laboratory testing.    PIP/TAZO Value in next row Sensitive      <=4 SENSITIVEThis is a modified FDA-approved test that has been validated and its performance characteristics determined by the reporting laboratory.  This laboratory is certified under the Clinical Laboratory Improvement Amendments CLIA as qualified to perform high complexity clinical laboratory testing.    MEROPENEM Value in next row Sensitive      <=4 SENSITIVEThis is a modified FDA-approved test that has been validated and its performance characteristics determined by the reporting laboratory.  This laboratory is certified under the Clinical Laboratory Improvement Amendments CLIA as qualified to perform high complexity clinical laboratory testing.    * >=100,000 COLONIES/mL ESCHERICHIA COLI  Blood culture (routine x 2)     Status: Abnormal   Collection Time: 03/14/24  7:14 PM   Specimen: BLOOD  Result Value Ref Range Status   Specimen Description   Final    BLOOD LEFT ANTECUBITAL Performed at [facility], [address] [place], [place] [address]    Special Requests   Final    BOTTLES DRAWN AEROBIC AND ANAEROBIC Blood Culture results may not be optimal due to an inadequate volume of blood received in culture bottles Performed at [facility], [address], [place], [place] [address]    Culture  Setup Time   Final    GRAM POSITIVE COCCI AEROBIC BOTTLE ONLY Organism ID to follow CRITICAL RESULT CALLED TO, READ BACK BY AND VERIFIED WITH: JASON ROBBINS @ 03/15/2024 2140 AB Performed at [facility], [address] [place], [place] [address]    Culture (A)  Final    STAPHYLOCOCCUS EPIDERMIDIS THE SIGNIFICANCE OF ISOLATING THIS ORGANISM FROM A SINGLE VENIPUNCTURE CANNOT BE PREDICTED WITHOUT FURTHER CLINICAL AND CULTURE CORRELATION. SUSCEPTIBILITIES AVAILABLE ONLY ON REQUEST. Performed at [facility], [address], [place], [place] [address]    Report Status 03/18/2024 FINAL  Final  Blood Culture ID Panel (Reflexed)     Status: Abnormal   Collection Time: 03/14/24  7:14 PM  Result Value Ref Range Status   Enterococcus faecalis NOT DETECTED NOT DETECTED Final   Enterococcus Faecium NOT DETECTED NOT DETECTED Final  Listeria monocytogenes NOT DETECTED NOT DETECTED Final   Staphylococcus species DETECTED (A) NOT DETECTED Final    Comment: CRITICAL RESULT CALLED TO, READ BACK BY AND VERIFIED WITH: JASON ROBBINS @ 03/15/2024 2140 AB    Staphylococcus aureus (BCID) NOT DETECTED NOT DETECTED Final   Staphylococcus epidermidis DETECTED (A) NOT DETECTED Final    Comment: Methicillin (oxacillin) resistant coagulase negative staphylococcus. Possible blood culture contaminant (unless isolated from more than one blood culture draw or clinical case suggests pathogenicity). No antibiotic treatment is indicated for blood  culture contaminants. CRITICAL RESULT CALLED TO, READ BACK BY AND VERIFIED WITH: JASON ROBBINS @ 03/15/2024 2140 AB    Staphylococcus lugdunensis NOT DETECTED NOT DETECTED Final   Streptococcus species NOT DETECTED NOT DETECTED Final   Streptococcus agalactiae NOT DETECTED NOT DETECTED Final   Streptococcus pneumoniae NOT DETECTED NOT DETECTED Final   Streptococcus pyogenes NOT DETECTED NOT DETECTED Final   A.calcoaceticus-baumannii NOT DETECTED NOT DETECTED Final   Bacteroides fragilis NOT DETECTED NOT DETECTED Final   Enterobacterales NOT DETECTED NOT DETECTED Final   Enterobacter cloacae complex NOT DETECTED NOT DETECTED Final   Escherichia coli NOT DETECTED NOT DETECTED Final   Klebsiella aerogenes NOT DETECTED NOT DETECTED Final   Klebsiella oxytoca NOT DETECTED NOT DETECTED Final   Klebsiella pneumoniae NOT DETECTED NOT DETECTED Final   Proteus species NOT DETECTED NOT DETECTED Final   Salmonella species NOT DETECTED NOT DETECTED Final   Serratia marcescens NOT DETECTED NOT DETECTED  Final   Haemophilus influenzae NOT DETECTED NOT DETECTED Final   Neisseria meningitidis NOT DETECTED NOT DETECTED Final   Pseudomonas aeruginosa NOT DETECTED NOT DETECTED Final   Stenotrophomonas maltophilia NOT DETECTED NOT DETECTED Final   Candida albicans NOT DETECTED NOT DETECTED Final   Candida auris NOT DETECTED NOT DETECTED Final   Candida glabrata NOT DETECTED NOT DETECTED Final   Candida krusei NOT DETECTED NOT DETECTED Final   Candida parapsilosis NOT DETECTED NOT DETECTED Final   Candida tropicalis NOT DETECTED NOT DETECTED Final   Cryptococcus neoformans/gattii NOT DETECTED NOT DETECTED Final   Methicillin resistance mecA/C DETECTED (A) NOT DETECTED Final    Comment: CRITICAL RESULT CALLED TO, READ BACK BY AND VERIFIED WITH: JASON ROBBINS @ 03/15/2024 2140 AB Performed at [facility], [address] [place], [place] [address]      Radiology Studies: CT HEAD WO CONTRAST ( ) Result Date: 03/21/2024 EXAM: CT HEAD WITHOUT CONTRAST 03/21/2024 10:53:00 AM TECHNIQUE: CT of the head was performed without the administration of intravenous contrast. Automated exposure control, iterative reconstruction, and/or weight based adjustment of the mA/kV was utilized to reduce the radiation dose to as low as reasonably achievable. COMPARISON: 03/20/2024 CLINICAL HISTORY: Altered mental status, nontraumatic (Ped 0-17y); Subdural hematoma; 24-hour follow up study to assess subdural hematoma. FINDINGS: BRAIN AND VENTRICLES: Slightly decreased left parafalcine subdural hematoma. No acute hemorrhage. No evidence of acute infarct. No hydrocephalus. No mass effect or midline shift. ORBITS: No acute abnormality. SINUSES: Similar paranasal sinus mucosal thickening. SOFT TISSUES AND SKULL: No acute soft tissue abnormality. No skull fracture. IMPRESSION: 1. Slightly decreased left parafalcine subdural hematoma. Electronically signed by: [name] Stanford MD MD 03/21/2024 08:20 PM EST RP Workstation:  [id number]   EEG adult Result Date: 03/21/2024 Shelton Arlin KIDD, MD     03/21/2024 12:36 PM Patient Name: TONI DEMO MRN: [id number] Epilepsy Attending: Arlin KIDD Shelton Referring Physician/Provider: Cosette Blackwater, MD Date: 03/21/2024 Duration: 25.36 mins Patient history: 74yo F with ams. EEG to evaluate for seizure. Level  of alertness: Awake, asleep AEDs during EEG study: None Technical aspects: This EEG study was done with scalp electrodes positioned according to the 10-20 International system of electrode placement. Electrical activity was reviewed with band pass filter of 1-70Hz , sensitivity of 7 uV/mm, display speed of 21mm/sec with a 60Hz  notched filter applied as appropriate. EEG data were recorded continuously and digitally stored.  Video monitoring was available and reviewed as appropriate. Description: No clear posterior dominant rhythm was seen. Sleep was characterized by sleep spindles (12 to 14 Hz), maximal frontocentral region. There is continuous generalized 3 to 6 Hz theta-delta slowing. Hyperventilation and photic stimulation were not performed.   ABNORMALITY - Continuous slow, generalized IMPRESSION: This study is suggestive of generalized cerebral dysfunction (encephalopathy). No seizures or epileptiform discharges were seen throughout the recording. Priyanka O Yadav    Scheduled Meds:  budesonide -glycopyrrolate -formoterol   2 puff Inhalation BID   donepezil   5 mg Oral Daily   DULoxetine   120 mg Oral Daily   feeding supplement  237 mL Oral BID BM   flecainide   100 mg Oral BID   hydrOXYzine   25 mg Oral BID   melatonin  10 mg Oral QHS   metoprolol  succinate  50 mg Oral Daily   pantoprazole   40 mg Oral BID AC   rosuvastatin   10 mg Oral Daily   Continuous Infusions:   LOS: 8 days   Norval Bar, MD  [facility]  03/22/2024, 11:42 AM   "

## 2024-03-23 DIAGNOSIS — S22000A Wedge compression fracture of unspecified thoracic vertebra, initial encounter for closed fracture: Secondary | ICD-10-CM | POA: Diagnosis not present

## 2024-03-23 DIAGNOSIS — Z515 Encounter for palliative care: Secondary | ICD-10-CM | POA: Diagnosis not present

## 2024-03-23 DIAGNOSIS — N3000 Acute cystitis without hematuria: Secondary | ICD-10-CM | POA: Diagnosis not present

## 2024-03-23 DIAGNOSIS — D4989 Neoplasm of unspecified behavior of other specified sites: Secondary | ICD-10-CM

## 2024-03-23 DIAGNOSIS — S065XAA Traumatic subdural hemorrhage with loss of consciousness status unknown, initial encounter: Secondary | ICD-10-CM | POA: Diagnosis not present

## 2024-03-23 DIAGNOSIS — Z7189 Other specified counseling: Secondary | ICD-10-CM

## 2024-03-23 DIAGNOSIS — D696 Thrombocytopenia, unspecified: Secondary | ICD-10-CM | POA: Diagnosis not present

## 2024-03-23 DIAGNOSIS — R531 Weakness: Secondary | ICD-10-CM

## 2024-03-23 DIAGNOSIS — D649 Anemia, unspecified: Secondary | ICD-10-CM | POA: Diagnosis not present

## 2024-03-23 LAB — CBC
HCT: 27.5 % — ABNORMAL LOW (ref 36.0–46.0)
Hemoglobin: 8.8 g/dL — ABNORMAL LOW (ref 12.0–15.0)
MCH: 29.3 pg (ref 26.0–34.0)
MCHC: 32 g/dL (ref 30.0–36.0)
MCV: 91.7 fL (ref 80.0–100.0)
Platelets: 74 K/uL — ABNORMAL LOW (ref 150–400)
RBC: 3 MIL/uL — ABNORMAL LOW (ref 3.87–5.11)
RDW: 16 % — ABNORMAL HIGH (ref 11.5–15.5)
WBC: 17 K/uL — ABNORMAL HIGH (ref 4.0–10.5)
nRBC: 1.5 % — ABNORMAL HIGH (ref 0.0–0.2)

## 2024-03-23 NOTE — Plan of Care (Signed)
" °  Problem: Education: Goal: Knowledge of General Education information will improve Description: Including pain rating scale, medication(s)/side effects and non-pharmacologic comfort measures 03/23/2024 0220 by Vicci Delon PARAS, RN Outcome: Progressing 03/23/2024 0220 by Vicci Delon PARAS, RN Outcome: Progressing   Problem: Health Behavior/Discharge Planning: Goal: Ability to manage health-related needs will improve 03/23/2024 0220 by Vicci Delon PARAS, RN Outcome: Progressing 03/23/2024 0220 by Vicci Delon PARAS, RN Outcome: Progressing   Problem: Clinical Measurements: Goal: Ability to maintain clinical measurements within normal limits will improve 03/23/2024 0220 by Vicci Delon PARAS, RN Outcome: Progressing 03/23/2024 0220 by Vicci Delon PARAS, RN Outcome: Progressing Goal: Will remain free from infection 03/23/2024 0220 by Vicci Delon PARAS, RN Outcome: Progressing 03/23/2024 0220 by Vicci Delon PARAS, RN Outcome: Progressing Goal: Diagnostic test results will improve 03/23/2024 0220 by Vicci Delon PARAS, RN Outcome: Progressing 03/23/2024 0220 by Vicci Delon PARAS, RN Outcome: Progressing Goal: Respiratory complications will improve Outcome: Progressing Goal: Cardiovascular complication will be avoided Outcome: Progressing   Problem: Activity: Goal: Risk for activity intolerance will decrease Outcome: Progressing   Problem: Nutrition: Goal: Adequate nutrition will be maintained Outcome: Progressing   Problem: Coping: Goal: Level of anxiety will decrease Outcome: Progressing   Problem: Elimination: Goal: Will not experience complications related to bowel motility Outcome: Progressing Goal: Will not experience complications related to urinary retention Outcome: Progressing   Problem: Pain Managment: Goal: General experience of comfort will improve and/or be controlled Outcome: Progressing   Problem: Safety: Goal: Ability to remain free from  injury will improve Outcome: Progressing   Problem: Skin Integrity: Goal: Risk for impaired skin integrity will decrease Outcome: Progressing   "

## 2024-03-23 NOTE — Consult Note (Signed)
 "                                   Consultation Note Date: 03/23/2024   Patient Name: Anita Harrell  DOB: [birth year]  MRN: [id number]  Age / Sex: 74 y.o., female  PCP: Tower, [name], MD Referring Physician: Cosette Blackwater, MD  Reason for Consultation: Establishing goals of care   HPI/Brief Hospital Course: 74 y.o. female  with past medical history of HFpEF, p. A. Fib, HLD, dementia, depression and GAD, chronic back and neck pain, OSA admitted from home on 03/14/2024 with worsening generalized weakness, increased confusion and worsening neck/back pain.  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  Subjective:  Chart reviewed: Labs:Low albumin , trend of CBC since admission Vital signs:Stable Progress Notes:Primary team, TOC, oncology, neurology, nursing notes since admission Imaging:Imaging available since admission  Visited with Anita Harrell at her bedside. Multiple family  members present including husband, son, brother and niece. Anita Harrell and husband provider permission for conversations to be had in front of all visitors.  Introduced myself as a publishing rights manager as a member of the palliative care team. Explained palliative medicine is specialized medical care for people living with serious illness. It focuses on providing relief from the symptoms and stress of a serious illness. The goal is to improve quality of life for both the patient and the family.   Anita Harrell is able to engage in conversation but has moments of confusion, feels as though people were trying to kill her this morning. She is resting in bed, denies acute pain or discomfort, attempted breakfast as she was able to tolerate, resting well at night.  Anita Harrell and Jerry-husband provider a brief life review. She and [name] have been married for over 20 years. She has two sons from a previous marriage.[name] lives in the home with her and is her primary caretaker. At baseline she has been able to care for herself until recently which prompted hospital visit.  Anita Harrell and [name] able to collaboratively share their understanding of current medical issues. They speak to the results of bone marrow biopsy. Anita Harrell and family tearful and emotional during conversation.  We discussed patient's current illness and what it means in the larger context of patient's on-going co-morbidities. Natural disease trajectory and expectations at EOL were discussed.   [name] shares Anita Harrell has expressed in the past she would not be interested in pursuing aggressive cancer treatments. Anita Harrell confirms this. They also share they do not feel she would be able to tolerate aggressive cancer treatments  at this time.  Anita Harrell inquires about hospice care.  We discuss the overall philosophy of hospice, services provided by hospice and the difference between hospice at home versus LTC versus IPU. Shared at this  time, Anita Harrell not appropriate for [facility]. Family expresses wanting to learn more about hospice services.  Engaged TOC regarding family Leata Na, RN-ACC HL added, report provided to Kara.  Also discussed comfort measures with family, Anita Harrell would no longer receive aggressive medical interventions such as continuous vital signs, lab work, radiology testing, or medications not focused on comfort. All care would focus on how the patient is looking and feeling. This would include management of any symptoms that may cause discomfort, pain, shortness of breath, cough, nausea, agitation, anxiety, and/or secretions etc. Symptoms would be managed with medications and other non-pharmacological interventions such as spiritual support if requested, repositioning, music therapy, or therapeutic listening. Family verbalized understanding and appreciation. Family very overwhelmed and emotional at this time. Main focus for family is working on a plan to have Anita Harrell discharged.  Returned to bedside later in day, [name] Na, RN with [facility] hospice speaking with family. Assisted in answering questions and addressing concerns. At conclusion of PMT visit, [name] has expressed wanting to discharge home with hospice services following, [name] to address DME needs. Family and Anita Harrell remain tearful and emotional during conversation and visit.  I discussed importance of continued conversations with family/support persons and all members of their medical team regarding overall plan of care and treatment options ensuring decisions are in alignment with patients goals of care.  All questions/concerns addressed. Emotional support provided to patient/family/support persons. PMT will continue to follow and support patient as needed.   Objective: Primary Diagnoses: Present on Admission:  UTI (urinary tract infection)  Thrombocytopenia   Physical Exam Constitutional:      General: She is not in acute  distress. Pulmonary:     Effort: Pulmonary effort is normal. No respiratory distress.  Skin:    General: Skin is warm and dry.  Neurological:     Mental Status: She is alert.     Motor: Weakness present.     Comments: Waxing and waning mentation, moments of confusion/hallucination  Psychiatric:        Mood and Affect: Mood normal.        Behavior: Behavior normal.     Vital Signs: BP (!) 111/50 (BP Location: Left Arm)   Pulse 70   Temp (!) 97.5 F (36.4 C)   Resp 16   Ht 5' 3 (1.6 m)   Wt 73 kg   SpO2 94%   BMI 28.52 kg/m  Pain Scale: PAINAD   Pain Score: 0-No pain  IO: Intake/output summary:  Intake/Output Summary (Last 24 hours) at 03/23/2024 1634 Last data filed at 03/23/2024 1434 Gross per 24 hour  Intake 120 ml  Output 1500 ml  Net -1380 ml    LBM: Last BM Date : 03/21/24 Baseline Weight: Weight: 73 kg Most recent weight: Weight: 73 kg      Assessment and Plan  SUMMARY OF RECOMMENDATIONS   Continue current plan of care  Plan to discharge home with hospice services following-TOC and hospice liaison engaged  Palliative Prophylaxis:   Bowel Regimen, Delirium Protocol and Frequent Pain Assessment  Discussed With: Primary team, nursing staff, TOC and hospice liaison   Thank you for this consult and allowing Palliative Medicine to participate in the care of Anita Harrell. Palliative medicine will continue to follow  and assist as needed.   I personally spent a total of 90 minutes in the care of the patient today including preparing to see the patient, getting/reviewing separately obtained history, performing a medically appropriate exam/evaluation, counseling and educating, referring and communicating with other health care professionals, documenting clinical information in the EHR, and coordinating care.    Signed by: Waddell Lesches, DNP, AGNP-C Palliative Medicine    Please contact Palliative Medicine Team phone at [phone] for questions and  concerns.  For individual provider: See Amion   "

## 2024-03-23 NOTE — Plan of Care (Signed)
" °  Problem: Clinical Measurements: Goal: Ability to maintain clinical measurements within normal limits will improve Outcome: Progressing Goal: Respiratory complications will improve Outcome: Progressing Goal: Cardiovascular complication will be avoided Outcome: Progressing   Problem: Activity: Goal: Risk for activity intolerance will decrease Outcome: Not Progressing   Problem: Coping: Goal: Level of anxiety will decrease Outcome: Progressing   Problem: Safety: Goal: Ability to remain free from injury will improve Outcome: Progressing   "

## 2024-03-23 NOTE — Progress Notes (Addendum)
 [facility]) HOSPITAL LIAISON NOTE  Received request from Allena Henry, Case Manager (CM), for hospice services at home after discharge.  Met at the bedside and spoke with patient, her son [name], her step father, [name], and patient's step son, [name].  Patient's spouse was not present in person, but was on meeting via telephone.  Education related to hospice philosophy, services, and team approach to care provided to patient and family.  Time and space given for questions relating to the information given.  They all express understanding of information given. Per discussion, the plan is for discharge home with hospice after [name] is able to set up caregivers and DME can be delivered.  DME needs discussed.   Patient has the following equipment in the home:  oxygen  (adapt), wheelchair, 2 wheeled walker, rollator, BSC, shower chair  Patient/family requests the following equipment for delivery: Hospital bed, 2 full bed rails, over bed table              The address has been verified and is correct in the chart.  [name] is the family contact to arrange time of equipment delivery.  Please send signed and completed DNR home with patient/family if applicable.   Please provide prescriptions at discharge as needed to ensure ongoing symptom management.   [facility] information and contact numbers given to [name], spouse.  Above information shared with [name], CM and hospital medical care team.  Please call with any hospice related questions or concerns.  Thank you for the opportunity to participate in this patients care.  [name] [name], MA, BSN, RN, FNE Nurse Liaison [phone]

## 2024-03-23 NOTE — Progress Notes (Signed)
 "  Hematology/Oncology Progress note [phone] [phone]     Patient Care Team: Tower, [name], MD as PCP - General (Family Medicine) Perla, Evalene PARAS, MD as PCP - Cardiology (Cardiology) Rox Charleston, MD as Consulting Physician (Obstetrics and Gynecology) Tamea Dedra CROME, MD as Consulting Physician (Pulmonary Disease) Doles-Johnson, Teah, NP as Nurse Practitioner (Psychiatry) Fate Morna SAILOR, [facility] (Inactive) as Pharmacist (Pharmacist) Babara Call, MD as Consulting Physician (Oncology) Unk Corinn Skiff, MD as Consulting Physician (Gastroenterology)   Name of the patient: Anita Harrell  [id number]  1950/05/21  Date of visit: 03/23/2024   INTERVAL HISTORY-  03/21/2024 CT head showed Slightly decreased left parafalcine subdural hematoma  Bone marrow biopsy showed 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 No active bleeding.  Multiple family members at bedside. + Patient reports feeling weak, no appetite very poor oral intake. Allergies[1]  Patient Active Problem List   Diagnosis Date Noted   Thrombocytopenia 11/18/2022    Priority: High   Easy bruising 02/19/2024    Priority: Medium    Skin lesion 02/19/2024    Priority: Medium    Leukocytosis 03/30/2010    Priority: Medium    SDH (subdural hematoma) (HCC) 03/20/2024   Anemia 03/20/2024   Compression fracture of body of thoracic vertebra (HCC) 03/17/2024   Occult blood in stools 03/17/2024   Other pancytopenia (HCC) 03/16/2024   Pressure injury of skin 03/16/2024   Toe injury, left, initial encounter 02/21/2024   Cognitive impairment 02/21/2024   Tremor 02/21/2024   Dysuria 01/29/2024   Moderately severe major depression (HCC) 01/29/2024   Multiple closed pelvic fractures with disruption of pelvic circle,  initial encounter (HCC) 12/16/2023   Multiple closed pelvic fractures without disruption of pelvic circle (HCC) 12/15/2023   Closed fracture of head of left radius 12/15/2023   Closed fracture of left olecranon process 12/15/2023   Lumbar compression fracture, closed, initial encounter (HCC) 12/15/2023   OSA (obstructive sleep apnea) 07/31/2023   Osteopenia 07/31/2023   COPD (chronic obstructive pulmonary disease) (HCC) 04/03/2023   Hypophosphatemia 11/19/2022   Metabolic acidosis 11/19/2022   Chronic heart failure with preserved ejection fraction (HFpEF) (HCC) 11/18/2022   Paroxysmal atrial fibrillation (HCC) 11/18/2022   Polypharmacy 05/17/2022   General weakness 02/16/2022   Poor balance 02/16/2022   Falling 02/16/2022   Hypokalemia 02/13/2022   HTN (hypertension) 02/13/2022   DDD (degenerative disc disease), lumbar 12/26/2021   Atrial fibrillation with RVR (HCC) 10/05/2021   Estrogen deficiency 06/07/2021   Overactive bladder 10/17/2019   Medicare annual wellness visit, subsequent 01/16/2019   Thyroid  nodule 05/01/2018   Electronic cigarette use 04/03/2018   Obesity (BMI 30-39.9) 04/03/2018   Lung mass 02/01/2018   Smokers' cough (HCC) 01/01/2018   Microscopic hematuria 08/13/2012   UTI (urinary tract infection) 07/22/2012   Routine general medical examination at a health care facility 07/22/2012   Prediabetes 02/21/2008   Allergic rhinitis 07/25/2007   Low back pain 06/26/2007   Personal history of goiter 07/27/2006   Hyperlipidemia 07/27/2006   PANIC DISORDER 07/27/2006   Former smoker 07/27/2006   Depression with anxiety 07/27/2006   Insomnia 07/27/2006   History of colonic polyps 06/09/2004     Past Medical History:  Diagnosis Date   Allergy    allergic rhinitis   Anxiety    Arrhythmia    atrial fibrillation   Arthritis  Back pain    Chest pain    CHF (congestive heart failure) (HCC)    COPD (chronic obstructive pulmonary disease) (HCC)    Depression     Emphysema of lung (HCC)    Epigastric pain    GERD (gastroesophageal reflux disease)    Goiter    History of tobacco abuse    Hyperglycemia    mild   Hyperlipidemia    Hypertension    Insomnia    Hx of   Labile blood pressure    Panic disorder    History of   Personal history of colonic polyps 06/09/2004   SVD (spontaneous vaginal delivery)    x 2     Past Surgical History:  Procedure Laterality Date   ABDOMINAL HYSTERECTOMY N/A 09/24/2012   Procedure: HYSTERECTOMY ABDOMINAL;  Surgeon: Peggye Gull, MD;  Location: [facility];  Service: Gynecology;  Laterality: N/A;   BREAST BIOPSY Right 2016   CARDIOVERSION N/A 02/09/2022   Procedure: CARDIOVERSION;  Surgeon: Perla Evalene PARAS, MD;  Location: [facility];  Service: Cardiovascular;  Laterality: N/A;   CARDIOVERSION N/A 03/02/2022   Procedure: CARDIOVERSION;  Surgeon: Perla Evalene PARAS, MD;  Location: [facility];  Service: Cardiovascular;  Laterality: N/A;   COLONOSCOPY     COLONOSCOPY  2017   ELBOW SURGERY  2004   EYE SURGERY     bilateral lasik   IR BONE MARROW BIOPSY & ASPIRATION  03/18/2024   SALPINGOOPHORECTOMY Bilateral 09/24/2012   Procedure: SALPINGO OOPHORECTOMY;  Surgeon: Peggye Gull, MD;  Location: [facility];  Service: Gynecology;  Laterality: Bilateral;   THYROID  SURGERY     B9 massess   WART FULGURATION N/A 09/24/2012   Procedure: FULGURATION VAGINAL WART;  Surgeon: Peggye Gull, MD;  Location: [facility];  Service: Gynecology;  Laterality: N/A;   WISDOM TOOTH EXTRACTION      Social History   Socioeconomic History   Marital status: Married    Spouse name: Not on file   Number of children: Not on file   Years of education: Not on file   Highest education level: Not on file  Occupational History   Not on file  Tobacco Use   Smoking status: Former    Current packs/day: 0.00    Average packs/day: 1.0 packs/day    Types: E-cigarettes, Cigarettes    Quit date: 2011    Years since quitting: 15.0   Smokeless tobacco: Never   Vaping Use   Vaping status: Former   Quit date: 11/18/2022  Substance and Sexual Activity   Alcohol use: No    Alcohol/week: 0.0 standard drinks of alcohol   Drug use: No   Sexual activity: Yes    Birth control/protection: Post-menopausal  Other Topics Concern   Not on file  Social History Narrative   Lives with husband and 2 pets; dogs.   Social Drivers of Health   Tobacco Use: Medium Risk (03/14/2024)   Patient History    Smoking Tobacco Use: Former    Smokeless Tobacco Use: Never    Passive Exposure: Not on file  Financial Resource Strain: Low Risk  (09/19/2023)   Received from [facility]   Overall Financial Resource Strain (CARDIA)    Difficulty of Paying Living Expenses: Not hard at all  Food Insecurity: No Food Insecurity (03/15/2024)   Epic    Worried About Programme Researcher, Broadcasting/film/video in the Last Year: Never true    Ran Out of Food in the Last Year: Never true  Transportation Needs:  No Transportation Needs (03/15/2024)   Epic    Lack of Transportation (Medical): No    Lack of Transportation (Non-Medical): No  Physical Activity: Inactive (07/27/2023)   Exercise Vital Sign    Days of Exercise per Week: 0 days    Minutes of Exercise per Session: 0 min  Stress: No Stress Concern Present (07/27/2023)   [employer] of Occupational Health - Occupational Stress Questionnaire    Feeling of Stress : Only a little  Social Connections: Socially Integrated (03/15/2024)   Social Connection and Isolation Panel    Frequency of Communication with Friends and Family: Twice a week    Frequency of Social Gatherings with Friends and Family: Twice a week    Attends Religious Services: More than 4 times per year    Active Member of [employer] or Organizations: Yes    Attends Banker Meetings: 1 to 4 times per year    Marital Status: Married  Recent Concern: Social Connections - Moderately Isolated (12/16/2023)   Social Connection and Isolation Panel    Frequency of  Communication with Friends and Family: Three times a week    Frequency of Social Gatherings with Friends and Family: Once a week    Attends Religious Services: Never    Database Administrator or Organizations: No    Attends Banker Meetings: Never    Marital Status: Married  Catering Manager Violence: Not At Risk (03/15/2024)   Epic    Fear of Current or Ex-Partner: No    Emotionally Abused: No    Physically Abused: No    Sexually Abused: No  Depression (PHQ2-9): High Risk (02/21/2024)   Depression (PHQ2-9)    PHQ-2 Score: 22  Alcohol Screen: Low Risk (07/27/2023)   Alcohol Screen    Last Alcohol Screening Score (AUDIT): 0  Housing: Low Risk (03/15/2024)   Epic    Unable to Pay for Housing in the Last Year: No    Number of Times Moved in the Last Year: 0    Homeless in the Last Year: No  Utilities: Not At Risk (03/15/2024)   Epic    Threatened with loss of utilities: No  Health Literacy: Adequate Health Literacy (07/27/2023)   B1300 Health Literacy    Frequency of need for help with medical instructions: Never     Family History  Problem Relation Age of Onset   Hypertension Mother    Stroke Mother    Alcohol abuse Father    Cancer Father        lung CA   Heart disease Father        CHF   Cancer Sister        breast CA   Heart disease Sister        CHF from chemotx also has defib   Breast cancer Sister    Colon cancer Neg Hx    Esophageal cancer Neg Hx    Rectal cancer Neg Hx    Stomach cancer Neg Hx     Current Medications[2]   Physical exam:  Vitals:   03/22/24 2358 03/23/24 0435 03/23/24 0842 03/23/24 1213  BP: (!) 113/52 (!) 113/47 105/82 (!) 111/50  Pulse: 80 81 86 70  Resp: 18  17 16   Temp: 98.2 F (36.8 C) 98.4 F (36.9 C) 97.7 F (36.5 C) (!) 97.5 F (36.4 C)  TempSrc: Oral Oral    SpO2: 92% 95% 95% 94%  Weight:      Height:  Physical Exam Constitutional:      General: She is not in acute distress.    Appearance: She is  ill-appearing. She is not diaphoretic.  Eyes:     General: No scleral icterus. Pulmonary:     Effort: Pulmonary effort is normal. No respiratory distress.  Abdominal:     General: There is no distension.  Musculoskeletal:        General: Normal range of motion.  Skin:    Coloration: Skin is pale.     Findings: No erythema.  Neurological:     Mental Status: She is alert. Mental status is at baseline.     Motor: No abnormal muscle tone.     Comments: Oriented x 2  Psychiatric:        Mood and Affect: Affect normal.       Labs    Latest Ref Rng & Units 03/23/2024    9:54 AM 03/22/2024    5:03 AM 03/21/2024    8:13 PM  CBC  WBC 4.0 - 10.5 K/uL 17.0  12.2  14.6   Hemoglobin 12.0 - 15.0 g/dL 8.8  7.9  7.9   Hematocrit 36.0 - 46.0 % 27.5  24.0  23.5   Platelets 150 - 400 K/uL 74  69  77       Latest Ref Rng & Units 03/21/2024    4:38 AM 03/20/2024   12:51 PM 03/19/2024    4:36 AM  CMP  Glucose 70 - 99 mg/dL 83  885  93   BUN 8 - 23 mg/dL 8  7  8    Creatinine 0.44 - 1.00 mg/dL 9.48  9.52  9.52   Sodium 135 - 145 mmol/L 138  135  138   Potassium 3.5 - 5.1 mmol/L 3.9  3.2  3.5   Chloride 98 - 111 mmol/L 101  99  102   CO2 22 - 32 mmol/L 27  29  26    Calcium  8.9 - 10.3 mg/dL 89.6  88.8  89.0      RADIOGRAPHIC STUDIES: I have personally reviewed the radiological images as listed and agreed with the findings in the report. CT HEAD WO CONTRAST ( ) Addendum Date: 03/20/2024 ADDENDUM #1  ADDENDUM: Study discussed by telephone with Doctor Tariq at 1243 hours on March 20, 2024 ---------------------------------------------------- Electronically signed by: Helayne Hurst MD MD 03/20/2024 01:03 PM EST RP Workstation: [id number]   Result Date: 03/20/2024  ORIGINAL REPORT  EXAM: CT HEAD WITHOUT CONTRAST 03/20/2024 10:11:57 AM TECHNIQUE: CT of the head was performed without the administration of intravenous contrast. Automated exposure control, iterative reconstruction, and/or weight based  adjustment of the mA/kV was utilized to reduce the radiation dose to as low as reasonably achievable. COMPARISON: Brain MRI 09/25/2023, Head CT 12/15/2023. CLINICAL HISTORY: 74 year old female. Altered mental status, nontraumatic. Recently being evaluated for anemia and thrombocytopenia FINDINGS: BRAIN AND VENTRICLES: Brain volume stable from last year, within normal limits for age. Gray white differentiation stable and within normal limits for age. New intermediate density thickening along the interhemispheric fissure (series 4 image 22) which is fairly extensive anterior to posterior but measures only 2 to 3 mm in thickness. This is most compatible with trace parafalcine subdural hematoma (series 2 image 27). No other intracranial hemorrhage identified. No evidence of acute infarct. No hydrocephalus. No extra-axial collection (except for the parafalcine subdural hematoma). No mass effect or midline shift. No suspicious intracranial vascular hyperdensity. Calcified atherosclerosis at the skull base. ORBITS: No acute abnormality. SINUSES: Widely scattered bilateral  paranasal sinuses mucosal thickening is new with bubbly opacity. Tympanic cavities remain well aerated. Mild right mastoid effusion. Mild retained secretions in the visible nasopharynx. SOFT TISSUES AND SKULL: No acute soft tissue abnormality. No skull fracture. IMPRESSION: 1. Trace parafalcine subdural hematoma (2-3 mm) appears acute or subacute, without intracranial mass effect. No other acute intracranial abnormality. 2. Bilateral paranasal sinus inflammation. Electronically signed by: Helayne Hurst MD MD 03/20/2024 12:23 PM EST RP Workstation: [id number]   IR BONE MARROW BIOPSY & ASPIRATION Result Date: 03/18/2024 INDICATION: 74 year old with anemia and thrombocytopenia. EXAM: FLUOROSCOPIC GUIDED BONE MARROW ASPIRATES AND BIOPSY Physician: Juliene SAUNDERS. Henn, MD MEDICATIONS: Moderate sedation ANESTHESIA/SEDATION: Moderate (conscious) sedation was employed  during this procedure. A total of Versed  1 mg and fentanyl  50 mcg was administered intravenously at the order of the provider performing the procedure. Total intra-service moderate sedation time:  . Patient's level of consciousness and vital signs were monitored continuously by radiology nurse throughout the procedure under the supervision of the provider performing the procedure. FLUOROSCOPY: Radiation Exposure Index (as provided by the fluoroscopic device): 3 mGy Kerma COMPLICATIONS: None immediate. PROCEDURE: The procedure was explained to the patient. The risks and benefits of the procedure were discussed and the patient's questions were addressed. Informed consent was obtained from the patient. The patient was placed prone on interventional table. The back was prepped and draped in sterile fashion. Maximal barrier sterile technique was utilized including caps, mask, sterile gowns, sterile gloves, sterile drape, hand hygiene and skin antiseptic. The skin and right posterior ilium were anesthetized with 1% lidocaine . 11 gauge bone needle was directed into the right ilium with fluoroscopic guidance. Two aspirates and 2 core biopsies were obtained. Bandage placed over the puncture site. Fluoroscopic image saved for documentation. IMPRESSION: Fluoroscopic guided bone marrow aspiration and core biopsy. Electronically Signed   By: Juliene Balder M.D.   On: 03/18/2024 11:23   CT CERVICAL SPINE WO CONTRAST Result Date: 03/15/2024 EXAM: CT CERVICAL SPINE WITHOUT CONTRAST 03/15/2024 02:22:42 AM TECHNIQUE: CT of the cervical spine was performed without the administration of intravenous contrast. Multiplanar reformatted images are provided for review. Automated exposure control, iterative reconstruction, and/or weight based adjustment of the mA/kV was utilized to reduce the radiation dose to as low as reasonably achievable. COMPARISON: None available. CLINICAL HISTORY: neck pain FINDINGS: BONES AND ALIGNMENT: No  acute fracture or traumatic malalignment. DEGENERATIVE CHANGES: Mild degenerative disc disease without spinal canal stenosis. Multilevel facet arthrosis. No high-grade foraminal stenosis. SOFT TISSUES: No prevertebral soft tissue swelling. IMPRESSION: 1. Mild degenerative disc disease without spinal canal stenosis. 2. Multilevel facet arthrosis without high-grade foraminal stenosis. Electronically signed by: [name] Stanford MD 03/15/2024 02:31 AM EST RP Workstation: [id number]   CT Angio Chest/Abd/Pel for Dissection W and/or Wo Contrast Result Date: 03/14/2024 EXAM: CTA CHEST, ABDOMEN AND PELVIS WITH AND WITHOUT CONTRAST 03/14/2024 08:35:25 PM TECHNIQUE: CTA of the chest was performed with and without the administration of intravenous contrast. CTA of the abdomen and pelvis was performed with and without the administration of intravenous contrast. Multiplanar reformatted images are provided for review. MIP images are provided for review. Automated exposure control, iterative reconstruction, and/or weight based adjustment of the mA/kV was utilized to reduce the radiation dose to as low as reasonably achievable. COMPARISON: 12/15/2023. CLINICAL HISTORY: weakness, back pain FINDINGS: VASCULATURE: Coronary artery and aortic atherosclerosis are present. AORTA: Aortic atherosclerosis is present. No acute finding. No abdominal aortic aneurysm. No dissection. PULMONARY ARTERIES: No pulmonary embolism with the limits of this exam. GREAT VESSELS  OF AORTIC ARCH: No acute finding. No dissection. No arterial occlusion or significant stenosis. CELIAC TRUNK: No acute finding. No occlusion or significant stenosis. SUPERIOR MESENTERIC ARTERY: No acute finding. No occlusion or significant stenosis. INFERIOR MESENTERIC ARTERY: No acute finding. No occlusion or significant stenosis. RENAL ARTERIES: No acute finding. No occlusion or significant stenosis. ILIAC ARTERIES: No acute finding. No occlusion or significant stenosis. CHEST:  MEDIASTINUM: The heart is borderline in size. Coronary artery atherosclerosis is present. No mediastinal lymphadenopathy. The pericardium demonstrates no acute abnormality. LUNGS AND PLEURA: Dependent opacities in the lower lobes bilaterally, left greater than right. Favor atelectasis although pneumonia cannot be excluded in the left lower lobe. No evidence of pleural effusion or pneumothorax. THORACIC BONES AND SOFT TISSUES: Healed posterior right lower rib fractures. These are unchanged since prior study. Mild depression through the inferior endplate of T6 and superior endplate of T8, new since prior study. ABDOMEN AND PELVIS: LIVER: The liver is unremarkable. GALLBLADDER AND BILE DUCTS: Gallbladder is unremarkable. No biliary ductal dilatation. SPLEEN: The spleen is unremarkable. PANCREAS: The pancreas is unremarkable. ADRENAL GLANDS: Bilateral adrenal glands demonstrate no acute abnormality. KIDNEYS, URETERS AND BLADDER: No stones in the kidneys or ureters. No hydronephrosis. No perinephric or periureteral stranding. Urinary bladder is unremarkable. GI AND BOWEL: Stomach and duodenal sweep demonstrate no acute abnormality. There is no bowel obstruction. No abnormal bowel wall thickening or distension. REPRODUCTIVE: Prior hysterectomy. No adnexal mass. PERITONEUM AND RETROPERITONEUM: No ascites or free air. LYMPH NODES: No lymphadenopathy. ABDOMINAL BONES AND SOFT TISSUES: Right old fracture noted at the superior and inferior left pubic rami and left sacrum. Chronic stable mild compression fracture at L5. No acute soft tissue abnormality. IMPRESSION: 1. Coronary artery, aortic atherosclerosis. No evidence of aneurysm or dissection. 2. New vertebral endplate compression fractures at T6 and T8 since prior study. 3. Dependent lower lobe opacities, left greater than right, favored atelectasis, although left lower lobe pneumonia is not excluded. Electronically signed by: [name] Crease MD 03/14/2024 09:29 PM EST RP  Workstation: [id number]   DG Chest Portable 1 View Result Date: 03/14/2024 EXAM: 1 VIEW(S) XRAY OF THE CHEST 03/14/2024 07:31:00 PM COMPARISON: Comparison with 12/14/2023. CLINICAL HISTORY: weakness FINDINGS: LUNGS AND PLEURA: Shallow inspiration. Infiltration or atelectasis in the left base is new since the prior study. Probable small left pleural effusion. No pneumothorax. HEART AND MEDIASTINUM: No acute abnormality of the cardiac and mediastinal silhouettes. BONES AND SOFT TISSUES: Postoperative changes in the base of the neck. Degenerative changes in the spine and shoulders. No acute osseous abnormality. IMPRESSION: 1. New left basilar infiltration or atelectasis and probable small left pleural effusion. Electronically signed by: Elsie Gravely MD 03/14/2024 07:42 PM EST RP Workstation: [id number]    Assessment and plan-   # Acute on chronic thrombocytopenia, acute anemia. Outpatient workup showed normal B12, folate, iron panel was not consistent with iron deficiency.  Multiple myeloma panel showed IgM lambda MGUS with M protein of 0.3.  LDH was elevated 404.  Negative peripheral blood flow cytometry.  Normal immature platelet fraction-indicating decreased bone marrow production. Inpatient lab workup showed normal haptoglobin, less likely hemolysis.  CT scan showed no lymphadenopathy. Her anemia and thrombocytopenia are slowly improving as she recovers from acute UTI.  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  Results were reviewed with patient's husband [name] [facility]) and son yesterday over the phone. Patient most likely is  not be a good candidate for aggressive  treatments.  Options of second opinion tertiary centers were reviewed and discussed with family.. Today I further discussed with patient's husband [name].  He feels like patient would not want aggressive treatments if she was able to  make her decision.  Family has decided to pursue with hospice.  Palliative care has been consulted and following.   Incidental SDH  Stable or slightly decreased on CT.  Continue supportive care.  .  Thank you for allowing me to participate in the care of this patient.    [name], MD, PhD Hematology Oncology 03/23/2024      [1]  Allergies Allergen Reactions   Abilify [Aripiprazole] Other (See Comments)    Vision problems   Ambien  [Zolpidem ] Other (See Comments)    Hallucinations MS change    Codeine Nausea And Vomiting    Patient able to tolerate hydrocodone  and oxycodone    Lipitor [Atorvastatin] Other (See Comments)    Myalgias    Vesicare  [Solifenacin ] Other (See Comments)    MS change   Wellbutrin  [Bupropion ] Other (See Comments)    Unknown reaction  [2]  Current Facility-Administered Medications:    acetaminophen  (TYLENOL ) tablet 650 mg, 650 mg, Oral, Q6H PRN, Ponnala, Shruthi, MD, 650 mg at 03/21/24 2250   albuterol  (PROVENTIL ) (2.5 MG/3ML) 0.083% nebulizer solution 2.5 mg, 2.5 mg, Inhalation, Q6H PRN, Ponnala, Shruthi, MD   budesonide -glycopyrrolate -formoterol  (BREZTRI ) 160-9-4.8 MCG/ACT inhaler 2 puff, 2 puff, Inhalation, BID, Ponnala, Shruthi, MD, 2 puff at 03/23/24 0844   donepezil  (ARICEPT ) tablet 5 mg, 5 mg, Oral, Daily, Ponnala, Shruthi, MD, 5 mg at 03/23/24 0843   DULoxetine  (CYMBALTA ) DR capsule 120 mg, 120 mg, Oral, Daily, Jens Durand, MD, 120 mg at 03/23/24 0843   feeding supplement (ENSURE PLUS HIGH PROTEIN) liquid 237 mL, 237 mL, Oral, BID BM, Jens Durand, MD, 237 mL at 03/23/24 0844   flecainide  (TAMBOCOR ) tablet 100 mg, 100 mg, Oral, BID, Ponnala, Shruthi, MD, 100 mg at 03/23/24 0843   hydrOXYzine  (ATARAX ) tablet 25 mg, 25 mg, Oral, BID, Ponnala, Shruthi, MD, 25 mg at 03/23/24 0843   melatonin tablet 10 mg, 10 mg, Oral, QHS, Ponnala, Shruthi, MD, 10 mg at 03/22/24 2139   metoprolol  succinate (TOPROL -XL) 24 hr tablet 50 mg, 50 mg, Oral, Daily, Ayiku,  Bernard, MD, 50 mg at 03/23/24 0843   oxyCODONE  (Oxy IR/ROXICODONE ) immediate release tablet 2.5 mg, 2.5 mg, Oral, Q4H PRN, Ponnala, Shruthi, MD, 2.5 mg at 03/16/24 1802   pantoprazole  (PROTONIX ) EC tablet 40 mg, 40 mg, Oral, BID AC, Tariq, Hassan, MD, 40 mg at 03/23/24 0843   polyethylene glycol (MIRALAX  / GLYCOLAX ) packet 17 g, 17 g, Oral, Daily PRN, Tariq, Hassan, MD, 17 g at 03/19/24 1652   rosuvastatin  (CRESTOR ) tablet 10 mg, 10 mg, Oral, Daily, Ponnala, Shruthi, MD, 10 mg at 03/23/24 0843  "

## 2024-03-23 NOTE — Progress Notes (Signed)
 " PROGRESS NOTE    Anita Harrell  FMW:[id number] DOB: 1950-09-02 DOA: 03/14/2024 PCP: Randeen [name], MD  Subjective: No acute events overnight. Seen and examined at bedside with husband present. Unable to provide much history pertaining to current illness due to altered mentation. Able to answer simple questions and follow commands. Denies nausea, vomiting, constipation.   Hospital Course:  74 y.o. female  with medical history significant of chronic HFpEF, paroxysmal A-fib, HLD, dementia, depression, and generalized anxiety disorder, chronic neck and back pain, OSA, who presented to the ED because of general weakness, increasing confusion, neck and back pain.  He has chronic neck and back pain/lumbar radiculopathy and recently got steroid injection on 03/03/2024.  Since discharge from the rehab facility about a week before Thanksgiving, patient's condition has declined.  She has gotten weaker and weaker and she has been intermittently confused and confusion is typically worse in the evenings.  Husband and family has become increasingly difficult to take care of her at home.  She is to be able to ambulate with a walker.  She is now unable to ambulate.  Recently got 2 rounds of antibiotics for UTI.  She was prescribed 5 days of nitrofurantoin by her neurologist on 01/24/2024.  She was given second round of antibiotics with Keflex  on 02/21/2024.  Urine culture from 02/21/2024 showed pansensitive E. coli.    Admitted for recurrent UTI treated with IV ceftriaxone  then transitioned to amoxicillin  to finish a total 5 day course on 03/19/24.   Assessment and Plan:  Bicytopenia  Acute on chronic anemia  Acute thrombocytopenia Etiology remains unclear, concern for malignancy  No signs/symptoms of acute bleed.  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 Babara who stated patient likely not a good candidate for any treatment given co-morbidities GI recommended outpatient follow up for colonoscopy  - stopped home eliquis  - transfuse for Hgb goal > 7, platelet goal > 50K (given acute SDH) - hematology following - given referral to follow up with GI clinic for possible colonoscopy - palliative team following    Acute encephalopathy Atraumatic tiny subdural hematoma Hx of dementia Waxing and waning in nature Etiology unclear, possibly related to underlying dementia  Less likely due to the very tiny subdural hematoma CT head with tiny incidental SDH Repeat CT head in 24 hours with decrease in SDH Given no recent head trauma, it is unclear if the SDH resulted due to acute thrombocytopenia EEG with encephalopathy without seizures or epileptiform abnormalities  See management of thrombocytopenia as elsewhere Cont melatonin at bedtime Cont hydroxyzine  BID and duloxetine  Monitor oral intake, I/Os, Bms, sleep routine Monitor on fall, delirium, aspiration precautions   Recurrent UTI  S. Epidermidis bloodstream contamination - treated with IV ceftriaxone  then transitioned to amoxicillin  to finish a total 5 day course on 03/19/24.   Hypotension  - resolved with IV hydration and stopped home diuresis.  - follow up with PCP and cardiology for resumption timing given history of heart failure. Euvolemic at present    Thoracic compression fractures at T6 and T8. - Seen by neurosurgery with recommendation for conservative management and TLSO brace for comfort.  - Patient declined TLSO brace.  - Seen by PT/OT during admission. Will need SNF    Chronic hypoxic respiratory failure.  - Continue on home 2-3 L oxygen .  -  Wean as tolerated   Advance care planning - updated patient and husband at bedside with poor prognosis given concern for underlying aggressive hematologic malignancy and poor candidacy for treatment.  Husband exhibits understanding and wishes to discuss further goals of care with palliative and hospice teams soon.  DVT prophylaxis: SCDs Start: 03/15/24 0143  SCDs   Code Status: Limited: Do not attempt resuscitation (DNR) -DNR-LIMITED -Do Not Intubate/DNI  Family Communication: updated son at bedside Disposition Plan: TBD Reason for continuing need for hospitalization: severity of illness  Objective: Vitals:   03/22/24 2358 03/23/24 0435 03/23/24 0842 03/23/24 1213  BP: (!) 113/52 (!) 113/47 105/82 (!) 111/50  Pulse: 80 81 86 70  Resp: 18  17 16   Temp: 98.2 F (36.8 C) 98.4 F (36.9 C) 97.7 F (36.5 C) (!) 97.5 F (36.4 C)  TempSrc: Oral Oral    SpO2: 92% 95% 95% 94%  Weight:      Height:        Intake/Output Summary (Last 24 hours) at 03/23/2024 1321 Last data filed at 03/23/2024 0845 Gross per 24 hour  Intake 0 ml  Output 2050 ml  Net -2050 ml   Filed Weights   03/14/24 1626  Weight: 73 kg    Examination:  Physical Exam Vitals and nursing note reviewed.  Constitutional:      General: She is not in acute distress.    Appearance: She is ill-appearing.     Comments: Weak, frail  HENT:     Head: Normocephalic and atraumatic.  Cardiovascular:     Rate and Rhythm: Normal rate and regular rhythm.     Pulses: Normal pulses.     Heart sounds: Normal heart sounds.  Pulmonary:     Effort: Pulmonary effort is normal.     Breath sounds: Normal breath sounds.  Abdominal:     General: Bowel sounds are normal.     Palpations: Abdomen is soft.  Neurological:     Mental Status: She is alert. She is disoriented.     Data Reviewed: I have personally reviewed following labs and imaging studies  CBC: Recent Labs  Lab 03/20/24 2017 03/21/24 0438 03/21/24 2013 03/22/24 0503 03/23/24 0954  WBC 14.7* 10.8* 14.6* 12.2* 17.0*  HGB 9.1* 7.6* 7.9* 7.9* 8.8*  HCT 27.3* 23.1* 23.5* 24.0* 27.5*  MCV 88.6 89.9 90.0 90.6 91.7  PLT 60* 48* 77* 69* 74*   Basic Metabolic  Panel: Recent Labs  Lab 03/17/24 0419 03/18/24 0346 03/19/24 0436 03/20/24 1251 03/20/24 2017 03/21/24 0438  NA 140 137 138 135  --  138  K 3.5 2.9* 3.5 3.2*  --  3.9  CL 104 100 102 99  --  101  CO2 27 28 26 29   --  27  GLUCOSE 87 91 93 114*  --  83  BUN 8 8 8  7*  --  8  CREATININE 0.57 0.53 0.47 0.47  --  0.51  CALCIUM  10.9* 10.5* 10.9* 11.1*  --  10.3  MG  --  1.9 2.0  --  1.9  --   PHOS  --  4.4  --   --   --   --    GFR: Estimated Creatinine Clearance: 58.1 mL/min (by C-G formula based on SCr of 0.51 mg/dL). Liver Function Tests: Recent Labs  Lab 03/18/24 0346  ALBUMIN  2.4*   No results for input(s): LIPASE, AMYLASE in the last 168 hours. No results for input(s): AMMONIA in the last 168 hours. Coagulation Profile: No  results for input(s): INR, PROTIME in the last 168 hours. Cardiac Enzymes: No results for input(s): CKTOTAL, CKMB, CKMBINDEX, TROPONINI in the last 168 hours. ProBNP, BNP (last 5 results) Recent Labs    03/15/24 0823  PROBNP 1,237.0*   HbA1C: No results for input(s): HGBA1C in the last 72 hours. CBG: Recent Labs  Lab 03/22/24 0458 03/22/24 0828 03/22/24 1227 03/22/24 1634 03/22/24 2138  GLUCAP 64* 80 122* 76 108*   Lipid Profile: No results for input(s): CHOL, HDL, LDLCALC, TRIG, CHOLHDL, LDLDIRECT in the last 72 hours. Thyroid  Function Tests: No results for input(s): TSH, T4TOTAL, FREET4, T3FREE, THYROIDAB in the last 72 hours. Anemia Panel: Recent Labs    03/21/24 0438  RETICCTPCT 1.7   Sepsis Labs: No results for input(s): PROCALCITON, LATICACIDVEN in the last 168 hours.  Recent Results (from the past 240 hours)  Urine Culture     Status: Abnormal   Collection Time: 03/14/24  4:21 PM   Specimen: Urine, Clean Catch  Result Value Ref Range Status   Specimen Description   Final    URINE, CLEAN CATCH Performed at [facility], [address], [place], [place] [address]     Special Requests   Final    NONE Performed at [facility], [address] [place], [place] [address]    Culture >=100,000 COLONIES/mL ESCHERICHIA COLI (A)  Final   Report Status 03/17/2024 FINAL  Final   Organism ID, Bacteria ESCHERICHIA COLI (A)  Final      Susceptibility   Escherichia coli - MIC*    AMPICILLIN <=2 SENSITIVE Sensitive     CEFAZOLIN  (URINE) Value in next row Sensitive      <=1 SENSITIVEThis is a modified FDA-approved test that has been validated and its performance characteristics determined by the reporting laboratory.  This laboratory is certified under the Clinical Laboratory Improvement Amendments CLIA as qualified to perform high complexity clinical laboratory testing.    CEFEPIME Value in next row Sensitive      <=1 SENSITIVEThis is a modified FDA-approved test that has been validated and its performance characteristics determined by the reporting laboratory.  This laboratory is certified under the Clinical Laboratory Improvement Amendments CLIA as qualified to perform high complexity clinical laboratory testing.    ERTAPENEM Value in next row Sensitive      <=1 SENSITIVEThis is a modified FDA-approved test that has been validated and its performance characteristics determined by the reporting laboratory.  This laboratory is certified under the Clinical Laboratory Improvement Amendments CLIA as qualified to perform high complexity clinical laboratory testing.    CEFTRIAXONE  Value in next row Sensitive      <=1 SENSITIVEThis is a modified FDA-approved test that has been validated and its performance characteristics determined by the reporting laboratory.  This laboratory is certified under the Clinical Laboratory Improvement Amendments CLIA as qualified to perform high complexity clinical laboratory testing.    CIPROFLOXACIN  Value in next row Sensitive      <=1 SENSITIVEThis is a modified FDA-approved test that has been validated and its performance  characteristics determined by the reporting laboratory.  This laboratory is certified under the Clinical Laboratory Improvement Amendments CLIA as qualified to perform high complexity clinical laboratory testing.    GENTAMICIN Value in next row Sensitive      <=1 SENSITIVEThis is a modified FDA-approved test that has been validated and its performance characteristics determined by the reporting laboratory.  This laboratory is certified under the Clinical Laboratory Improvement Amendments CLIA as qualified to  perform high complexity clinical laboratory testing.    NITROFURANTOIN Value in next row Sensitive      <=1 SENSITIVEThis is a modified FDA-approved test that has been validated and its performance characteristics determined by the reporting laboratory.  This laboratory is certified under the Clinical Laboratory Improvement Amendments CLIA as qualified to perform high complexity clinical laboratory testing.    TRIMETH /SULFA  Value in next row Sensitive      <=1 SENSITIVEThis is a modified FDA-approved test that has been validated and its performance characteristics determined by the reporting laboratory.  This laboratory is certified under the Clinical Laboratory Improvement Amendments CLIA as qualified to perform high complexity clinical laboratory testing.    AMPICILLIN/SULBACTAM Value in next row Sensitive      <=1 SENSITIVEThis is a modified FDA-approved test that has been validated and its performance characteristics determined by the reporting laboratory.  This laboratory is certified under the Clinical Laboratory Improvement Amendments CLIA as qualified to perform high complexity clinical laboratory testing.    PIP/TAZO Value in next row Sensitive      <=4 SENSITIVEThis is a modified FDA-approved test that has been validated and its performance characteristics determined by the reporting laboratory.  This laboratory is certified under the Clinical Laboratory Improvement Amendments CLIA as  qualified to perform high complexity clinical laboratory testing.    MEROPENEM Value in next row Sensitive      <=4 SENSITIVEThis is a modified FDA-approved test that has been validated and its performance characteristics determined by the reporting laboratory.  This laboratory is certified under the Clinical Laboratory Improvement Amendments CLIA as qualified to perform high complexity clinical laboratory testing.    * >=100,000 COLONIES/mL ESCHERICHIA COLI  Blood culture (routine x 2)     Status: Abnormal   Collection Time: 03/14/24  7:14 PM   Specimen: BLOOD  Result Value Ref Range Status   Specimen Description   Final    BLOOD LEFT ANTECUBITAL Performed at [facility], [address] [place], [place] [address]    Special Requests   Final    BOTTLES DRAWN AEROBIC AND ANAEROBIC Blood Culture results may not be optimal due to an inadequate volume of blood received in culture bottles Performed at [facility], [address], [place], [place] [address]    Culture  Setup Time   Final    GRAM POSITIVE COCCI AEROBIC BOTTLE ONLY Organism ID to follow CRITICAL RESULT CALLED TO, READ BACK BY AND VERIFIED WITH: JASON ROBBINS @ 03/15/2024 2140 AB Performed at [facility], [address] [place], [place] [address]    Culture (A)  Final    STAPHYLOCOCCUS EPIDERMIDIS THE SIGNIFICANCE OF ISOLATING THIS ORGANISM FROM A SINGLE VENIPUNCTURE CANNOT BE PREDICTED WITHOUT FURTHER CLINICAL AND CULTURE CORRELATION. SUSCEPTIBILITIES AVAILABLE ONLY ON REQUEST. Performed at [facility], [address], [place], [place] [address]    Report Status 03/18/2024 FINAL  Final  Blood Culture ID Panel (Reflexed)     Status: Abnormal   Collection Time: 03/14/24  7:14 PM  Result Value Ref Range Status   Enterococcus faecalis NOT DETECTED NOT DETECTED Final   Enterococcus Faecium NOT DETECTED NOT DETECTED Final   Listeria monocytogenes NOT DETECTED NOT DETECTED Final    Staphylococcus species DETECTED (A) NOT DETECTED Final    Comment: CRITICAL RESULT CALLED TO, READ BACK BY AND VERIFIED WITH: JASON ROBBINS @ 03/15/2024 2140 AB    Staphylococcus aureus (BCID) NOT DETECTED NOT DETECTED Final   Staphylococcus epidermidis DETECTED (A)  NOT DETECTED Final    Comment: Methicillin (oxacillin) resistant coagulase negative staphylococcus. Possible blood culture contaminant (unless isolated from more than one blood culture draw or clinical case suggests pathogenicity). No antibiotic treatment is indicated for blood  culture contaminants. CRITICAL RESULT CALLED TO, READ BACK BY AND VERIFIED WITH: JASON ROBBINS @ 03/15/2024 2140 AB    Staphylococcus lugdunensis NOT DETECTED NOT DETECTED Final   Streptococcus species NOT DETECTED NOT DETECTED Final   Streptococcus agalactiae NOT DETECTED NOT DETECTED Final   Streptococcus pneumoniae NOT DETECTED NOT DETECTED Final   Streptococcus pyogenes NOT DETECTED NOT DETECTED Final   A.calcoaceticus-baumannii NOT DETECTED NOT DETECTED Final   Bacteroides fragilis NOT DETECTED NOT DETECTED Final   Enterobacterales NOT DETECTED NOT DETECTED Final   Enterobacter cloacae complex NOT DETECTED NOT DETECTED Final   Escherichia coli NOT DETECTED NOT DETECTED Final   Klebsiella aerogenes NOT DETECTED NOT DETECTED Final   Klebsiella oxytoca NOT DETECTED NOT DETECTED Final   Klebsiella pneumoniae NOT DETECTED NOT DETECTED Final   Proteus species NOT DETECTED NOT DETECTED Final   Salmonella species NOT DETECTED NOT DETECTED Final   Serratia marcescens NOT DETECTED NOT DETECTED Final   Haemophilus influenzae NOT DETECTED NOT DETECTED Final   Neisseria meningitidis NOT DETECTED NOT DETECTED Final   Pseudomonas aeruginosa NOT DETECTED NOT DETECTED Final   Stenotrophomonas maltophilia NOT DETECTED NOT DETECTED Final   Candida albicans NOT DETECTED NOT DETECTED Final   Candida auris NOT DETECTED NOT DETECTED Final   Candida glabrata NOT  DETECTED NOT DETECTED Final   Candida krusei NOT DETECTED NOT DETECTED Final   Candida parapsilosis NOT DETECTED NOT DETECTED Final   Candida tropicalis NOT DETECTED NOT DETECTED Final   Cryptococcus neoformans/gattii NOT DETECTED NOT DETECTED Final   Methicillin resistance mecA/C DETECTED (A) NOT DETECTED Final    Comment: CRITICAL RESULT CALLED TO, READ BACK BY AND VERIFIED WITH: JASON ROBBINS @ 03/15/2024 2140 AB Performed at [facility], [address], [place], [place] [address]      Radiology Studies: No results found.  Scheduled Meds:  budesonide -glycopyrrolate -formoterol   2 puff Inhalation BID   donepezil   5 mg Oral Daily   DULoxetine   120 mg Oral Daily   feeding supplement  237 mL Oral BID BM   flecainide   100 mg Oral BID   hydrOXYzine   25 mg Oral BID   melatonin  10 mg Oral QHS   metoprolol  succinate  50 mg Oral Daily   pantoprazole   40 mg Oral BID AC   rosuvastatin   10 mg Oral Daily   Continuous Infusions:   LOS: 9 days   Norval Bar, MD  [facility]  03/23/2024, 1:21 PM   "

## 2024-03-24 ENCOUNTER — Encounter (HOSPITAL_COMMUNITY): Payer: Self-pay

## 2024-03-24 DIAGNOSIS — N3000 Acute cystitis without hematuria: Secondary | ICD-10-CM | POA: Diagnosis not present

## 2024-03-24 LAB — CBC
HCT: 27.2 % — ABNORMAL LOW (ref 36.0–46.0)
Hemoglobin: 8.9 g/dL — ABNORMAL LOW (ref 12.0–15.0)
MCH: 29.2 pg (ref 26.0–34.0)
MCHC: 32.7 g/dL (ref 30.0–36.0)
MCV: 89.2 fL (ref 80.0–100.0)
Platelets: 73 K/uL — ABNORMAL LOW (ref 150–400)
RBC: 3.05 MIL/uL — ABNORMAL LOW (ref 3.87–5.11)
RDW: 16.4 % — ABNORMAL HIGH (ref 11.5–15.5)
WBC: 19.6 K/uL — ABNORMAL HIGH (ref 4.0–10.5)
nRBC: 2.3 % — ABNORMAL HIGH (ref 0.0–0.2)

## 2024-03-24 NOTE — TOC Progression Note (Addendum)
 Transition of Care [facility]) - Progression Note    Patient Details  Name: Anita Harrell MRN: [id number] Date of Birth: 1951/01/15  [facility]) CM/SW Contact  Lauraine JAYSON Carpen, LCSW Phone Number: 03/24/2024, 9:33 AM  Clinical Narrative:   Per chart review, plan is now for patient to discharge home with hospice through [facility]. CSW left [facility] SNF admissions coordinator a message to notify.  2:17 pm: Per [facility] liaison, DME will be delivered tomorrow and family will be meeting with the private care companies tomorrow. She stated family will be ready for her to discharge on Wednesday.  Expected Discharge Plan: Skilled Nursing Facility Barriers to Discharge: Continued Medical Work up               Expected Discharge Plan and Services In-house Referral: Clinical Social Work     Living arrangements for the past 2 months: Single Family Home Expected Discharge Date: 03/19/24                                     Social Drivers of Health (SDOH) Interventions SDOH Screenings   Food Insecurity: No Food Insecurity (03/15/2024)  Housing: Low Risk (03/15/2024)  Transportation Needs: No Transportation Needs (03/15/2024)  Utilities: Not At Risk (03/15/2024)  Alcohol Screen: Low Risk (07/27/2023)  Depression (PHQ2-9): High Risk (02/21/2024)  Financial Resource Strain: Low Risk  (09/19/2023)   Received from [facility]  Physical Activity: Inactive (07/27/2023)  Social Connections: Socially Integrated (03/15/2024)  Recent Concern: Social Connections - Moderately Isolated (12/16/2023)  Stress: No Stress Concern Present (07/27/2023)  Tobacco Use: Medium Risk (03/14/2024)  Health Literacy: Adequate Health Literacy (07/27/2023)    Readmission Risk Interventions     No data to display

## 2024-03-24 NOTE — Plan of Care (Signed)

## 2024-03-24 NOTE — Progress Notes (Incomplete)
 "                                                                                                                                                                                               Palliative Care Progress Note, Assessment & Plan   Patient Name: Anita Harrell       Date: 03/24/2024 DOB: Mar 19, 1950  Age: 74 y.o. MRN#: [id number] Attending Physician: Cosette Blackwater, MD Primary Care Physician: Tower, [name], MD Admit Date: 03/14/2024  Subjective: Patient is sitting up in bed, tearful.  She is awake, alert, acknowledges my presence, and is able to make her wishes known.  Her husband and family are at bedside.  She has no acute complaints at this time.  HPI: 74 y.o. female  with past medical history of HFpEF, p. A. Fib, HLD, dementia, depression and GAD, chronic back and neck pain, OSA admitted from home on 03/14/2024 with worsening generalized weakness, increased confusion and worsening neck/back pain.   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  Following up today for continued support with goals of care discussions.  Summary of counseling/coordination of care: Chart review completed prior to meeting patient including:  -Vital signs: WNL, patient saturating at 95% on room air -Progress notes: As per TOC note on 1/12, plan is for DME to be delivered tomorrow 1/13 and family will be prepared for discharge on Wednesday 1/14  After reviewing the patient's chart and assessing the patient at bedside, I spoke with patient in regards to symptom management and goals of care.   Symptoms assessed.  Patient denies pain, headache, N/V/D, and other acute issues at this time.  She shares she is grateful to have time with her family.  Family endorses she had a few bites of breakfast but has not had an appetite for food.  Discussed comfort feeds and food for pleasure/joy.  Patient endorsed understanding, appreciation, but says she has no taste for anything at the moment.  Encouraged family to offer patient's favorite choices whenever she is hungry.  However, no need to push patient to eat if she is not hungry at this time.  No adjustment to [facility] needed.  Discussed plan remains for patient to discharge home once DME and additional care services have been organized.  TOC is following closely.  Patient's family endorsed understanding and remained in agreement with full comfort measures to continue and with plan as described.  PMT will continue to follow and support.  Physical Exam Vitals reviewed.  Constitutional:      General: She is not in acute distress. HENT:     Head: Normocephalic.     Mouth/Throat:     Mouth: Mucous membranes are moist.  Eyes:     Pupils: Pupils are equal, round, and reactive to light.  Pulmonary:     Effort: Pulmonary effort is normal.  Abdominal:     Palpations: Abdomen is soft.  Skin:    General: Skin is warm and dry.  Neurological:      Mental Status: She is alert.  Psychiatric:        Mood and Affect: Mood normal.        Behavior: Behavior normal.              Recommendations:   Comfort feeds for pleasure/joy Cluster care and minimize interruption to optimize rest/quality time with family DME to be delivered 1/13 and patient to discharge on 1/14  I personally spent a total of 25 minutes in the care of the patient today including preparing to see the patient, counseling and educating, and documenting clinical information in the EHR.   Anita L. Arvid, DNP, FNP-BC Palliative Medicine Team   "

## 2024-03-24 NOTE — Progress Notes (Signed)
 " PROGRESS NOTE    Anita Harrell  FMW:[id number] DOB: [birth year] DOA: 03/14/2024 PCP: Randeen [name], MD  Subjective: No acute events overnight. Seen and examined at bedside with son present. Reports concerned about being in pain before she passes away and wants to make sure hospice team with be with her to make sure she is comfortable at the time of death.    Hospital Course:  74 y.o. female  with medical history significant of chronic HFpEF, paroxysmal A-fib, HLD, dementia, depression, and generalized anxiety disorder, chronic neck and back pain, OSA, who presented to the ED because of general weakness, increasing confusion, neck and back pain.  He has chronic neck and back pain/lumbar radiculopathy and recently got steroid injection on 03/03/2024.  Since discharge from the rehab facility about a week before Thanksgiving, patient's condition has declined.  She has gotten weaker and weaker and she has been intermittently confused and confusion is typically worse in the evenings.  Husband and family has become increasingly difficult to take care of her at home.  She is to be able to ambulate with a walker.  She is now unable to ambulate.  Recently got 2 rounds of antibiotics for UTI.  She was prescribed 5 days of nitrofurantoin by her neurologist on 01/24/2024.  She was given second round of antibiotics with Keflex  on 02/21/2024.  Urine culture from 02/21/2024 showed pansensitive E. coli.    Admitted for recurrent UTI treated with IV ceftriaxone  then transitioned to amoxicillin  to finish a total 5 day course on 03/19/24.   Assessment and Plan:  Bicytopenia  Acute on chronic anemia  Acute thrombocytopenia Etiology remains unclear, concern for malignancy  No signs/symptoms of acute bleed.  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 Babara who stated patient likely not a good candidate for any treatment given co-morbidities GI recommended outpatient follow up for colonoscopy  - stopped home eliquis  - transfuse for Hgb goal > 7, platelet goal > 50K (given acute SDH) - hematology following - given referral to follow up with GI clinic for possible colonoscopy - palliative team following    Acute encephalopathy Atraumatic tiny subdural hematoma Hx of dementia Waxing and waning in nature Etiology unclear, possibly related to underlying dementia  Less likely due to the very tiny subdural hematoma CT head with tiny incidental SDH Repeat CT head in 24 hours with decrease in SDH Given no recent head trauma, it is unclear if the SDH resulted due to acute thrombocytopenia EEG with encephalopathy without seizures or epileptiform abnormalities  See management of thrombocytopenia as elsewhere Cont melatonin at bedtime Cont hydroxyzine  BID and duloxetine  Monitor oral intake, I/Os, Bms, sleep routine Monitor on fall, delirium, aspiration precautions   Recurrent UTI  S. Epidermidis bloodstream contamination - treated with IV ceftriaxone  then transitioned to amoxicillin  to finish a total 5 day course on 03/19/24.   Hypotension  - resolved with IV hydration and stopped home diuresis.  - follow up with PCP and cardiology for resumption timing given history of heart failure. Euvolemic at present    Thoracic compression fractures at T6 and T8. - Seen by neurosurgery with recommendation for conservative management and TLSO brace for comfort.  - Patient declined TLSO brace.  - Seen by PT/OT during admission. Will need SNF    Chronic hypoxic respiratory failure.  -  Continue on home 2-3 L oxygen .  - Wean as tolerated  DVT prophylaxis: SCDs Start: 03/15/24 0143  SCDs   Code Status: Limited: Do not attempt resuscitation (DNR) -DNR-LIMITED -Do Not Intubate/DNI  Family Communication: updated son at  bedside Disposition Plan: Home with hospice on 1/14 Reason for continuing need for hospitalization: medically ready, family awaiting hospital bed among other supplies to be able to accept patient back home on hospice, CM and hospice team assisting  Objective: Vitals:   03/23/24 2044 03/24/24 0045 03/24/24 0510 03/24/24 0749  BP: (!) 114/55 (!) 121/56 124/62 (!) 136/57  Pulse: 73 74 73 71  Resp: 20 20 20 16   Temp: 98.2 F (36.8 C) 98.2 F (36.8 C) 98.4 F (36.9 C) (!) 97.5 F (36.4 C)  TempSrc:      SpO2: 98% 97% 93% 96%  Weight:      Height:        Intake/Output Summary (Last 24 hours) at 03/24/2024 1301 Last data filed at 03/23/2024 1900 Gross per 24 hour  Intake 120 ml  Output 850 ml  Net -730 ml   Filed Weights   03/14/24 1626  Weight: 73 kg    Examination:  Physical Exam Vitals and nursing note reviewed.  Constitutional:      General: She is not in acute distress.    Appearance: She is ill-appearing.     Comments: Weak, frail  HENT:     Head: Normocephalic and atraumatic.  Cardiovascular:     Rate and Rhythm: Normal rate and regular rhythm.     Pulses: Normal pulses.     Heart sounds: Normal heart sounds.  Pulmonary:     Effort: Pulmonary effort is normal.     Breath sounds: Normal breath sounds.  Abdominal:     General: Bowel sounds are normal.     Palpations: Abdomen is soft.  Neurological:     Mental Status: She is alert. She is disoriented.     Data Reviewed: I have personally reviewed following labs and imaging studies  CBC: Recent Labs  Lab 03/21/24 0438 03/21/24 2013 03/22/24 0503 03/23/24 0954 03/24/24 1020  WBC 10.8* 14.6* 12.2* 17.0* 19.6*  HGB 7.6* 7.9* 7.9* 8.8* 8.9*  HCT 23.1* 23.5* 24.0* 27.5* 27.2*  MCV 89.9 90.0 90.6 91.7 89.2  PLT 48* 77* 69* 74* 73*   Basic Metabolic Panel: Recent Labs  Lab 03/18/24 0346 03/19/24 0436 03/20/24 1251 03/20/24 2017 03/21/24 0438  NA 137 138 135  --  138  K 2.9* 3.5 3.2*  --  3.9  CL  100 102 99  --  101  CO2 28 26 29   --  27  GLUCOSE 91 93 114*  --  83  BUN 8 8 7*  --  8  CREATININE 0.53 0.47 0.47  --  0.51  CALCIUM  10.5* 10.9* 11.1*  --  10.3  MG 1.9 2.0  --  1.9  --   PHOS 4.4  --   --   --   --    GFR: Estimated Creatinine Clearance: 58.1 mL/min (by C-G formula based on SCr of 0.51 mg/dL). Liver Function Tests: Recent Labs  Lab 03/18/24 0346  ALBUMIN  2.4*   No results for input(s): LIPASE, AMYLASE in the last 168 hours. No results for input(s): AMMONIA in the last 168 hours. Coagulation Profile: No results for input(s): INR, PROTIME in the last 168 hours. Cardiac Enzymes: No results for input(s): CKTOTAL, CKMB, CKMBINDEX, TROPONINI in the last 168 hours. ProBNP, BNP (  last 5 results) Recent Labs    03/15/24 0823  PROBNP 1,237.0*   HbA1C: No results for input(s): HGBA1C in the last 72 hours. CBG: Recent Labs  Lab 03/22/24 0458 03/22/24 0828 03/22/24 1227 03/22/24 1634 03/22/24 2138  GLUCAP 64* 80 122* 76 108*   Lipid Profile: No results for input(s): CHOL, HDL, LDLCALC, TRIG, CHOLHDL, LDLDIRECT in the last 72 hours. Thyroid  Function Tests: No results for input(s): TSH, T4TOTAL, FREET4, T3FREE, THYROIDAB in the last 72 hours. Anemia Panel: No results for input(s): VITAMINB12, FOLATE, FERRITIN, TIBC, IRON, RETICCTPCT in the last 72 hours. Sepsis Labs: No results for input(s): PROCALCITON, LATICACIDVEN in the last 168 hours.  Recent Results (from the past 240 hours)  Urine Culture     Status: Abnormal   Collection Time: 03/14/24  4:21 PM   Specimen: Urine, Clean Catch  Result Value Ref Range Status   Specimen Description   Final    URINE, CLEAN CATCH Performed at [facility], [address], [place], [place] [address]    Special Requests   Final    NONE Performed at [facility], [address] [place], [place] [address]    Culture >=100,000 COLONIES/mL  ESCHERICHIA COLI (A)  Final   Report Status 03/17/2024 FINAL  Final   Organism ID, Bacteria ESCHERICHIA COLI (A)  Final      Susceptibility   Escherichia coli - MIC*    AMPICILLIN <=2 SENSITIVE Sensitive     CEFAZOLIN  (URINE) Value in next row Sensitive      <=1 SENSITIVEThis is a modified FDA-approved test that has been validated and its performance characteristics determined by the reporting laboratory.  This laboratory is certified under the Clinical Laboratory Improvement Amendments CLIA as qualified to perform high complexity clinical laboratory testing.    CEFEPIME Value in next row Sensitive      <=1 SENSITIVEThis is a modified FDA-approved test that has been validated and its performance characteristics determined by the reporting laboratory.  This laboratory is certified under the Clinical Laboratory Improvement Amendments CLIA as qualified to perform high complexity clinical laboratory testing.    ERTAPENEM Value in next row Sensitive      <=1 SENSITIVEThis is a modified FDA-approved test that has been validated and its performance characteristics determined by the reporting laboratory.  This laboratory is certified under the Clinical Laboratory Improvement Amendments CLIA as qualified to perform high complexity clinical laboratory testing.    CEFTRIAXONE  Value in next row Sensitive      <=1 SENSITIVEThis is a modified FDA-approved test that has been validated and its performance characteristics determined by the reporting laboratory.  This laboratory is certified under the Clinical Laboratory Improvement Amendments CLIA as qualified to perform high complexity clinical laboratory testing.    CIPROFLOXACIN  Value in next row Sensitive      <=1 SENSITIVEThis is a modified FDA-approved test that has been validated and its performance characteristics determined by the reporting laboratory.  This laboratory is certified under the Clinical Laboratory Improvement Amendments CLIA as qualified to  perform high complexity clinical laboratory testing.    GENTAMICIN Value in next row Sensitive      <=1 SENSITIVEThis is a modified FDA-approved test that has been validated and its performance characteristics determined by the reporting laboratory.  This laboratory is certified under the Clinical Laboratory Improvement Amendments CLIA as qualified to perform high complexity clinical laboratory testing.    NITROFURANTOIN Value in next row Sensitive      <=1 SENSITIVEThis is a  modified FDA-approved test that has been validated and its performance characteristics determined by the reporting laboratory.  This laboratory is certified under the Clinical Laboratory Improvement Amendments CLIA as qualified to perform high complexity clinical laboratory testing.    TRIMETH /SULFA  Value in next row Sensitive      <=1 SENSITIVEThis is a modified FDA-approved test that has been validated and its performance characteristics determined by the reporting laboratory.  This laboratory is certified under the Clinical Laboratory Improvement Amendments CLIA as qualified to perform high complexity clinical laboratory testing.    AMPICILLIN/SULBACTAM Value in next row Sensitive      <=1 SENSITIVEThis is a modified FDA-approved test that has been validated and its performance characteristics determined by the reporting laboratory.  This laboratory is certified under the Clinical Laboratory Improvement Amendments CLIA as qualified to perform high complexity clinical laboratory testing.    PIP/TAZO Value in next row Sensitive      <=4 SENSITIVEThis is a modified FDA-approved test that has been validated and its performance characteristics determined by the reporting laboratory.  This laboratory is certified under the Clinical Laboratory Improvement Amendments CLIA as qualified to perform high complexity clinical laboratory testing.    MEROPENEM Value in next row Sensitive      <=4 SENSITIVEThis is a modified FDA-approved test  that has been validated and its performance characteristics determined by the reporting laboratory.  This laboratory is certified under the Clinical Laboratory Improvement Amendments CLIA as qualified to perform high complexity clinical laboratory testing.    * >=100,000 COLONIES/mL ESCHERICHIA COLI  Blood culture (routine x 2)     Status: Abnormal   Collection Time: 03/14/24  7:14 PM   Specimen: BLOOD  Result Value Ref Range Status   Specimen Description   Final    BLOOD LEFT ANTECUBITAL Performed at [facility], [address] [place], [place] [address]    Special Requests   Final    BOTTLES DRAWN AEROBIC AND ANAEROBIC Blood Culture results may not be optimal due to an inadequate volume of blood received in culture bottles Performed at [facility], [address] [place], [place] [address]    Culture  Setup Time   Final    GRAM POSITIVE COCCI AEROBIC BOTTLE ONLY Organism ID to follow CRITICAL RESULT CALLED TO, READ BACK BY AND VERIFIED WITH: JASON ROBBINS @ 03/15/2024 2140 AB Performed at [facility], [address] [place], [place] [address]    Culture (A)  Final    STAPHYLOCOCCUS EPIDERMIDIS THE SIGNIFICANCE OF ISOLATING THIS ORGANISM FROM A SINGLE VENIPUNCTURE CANNOT BE PREDICTED WITHOUT FURTHER CLINICAL AND CULTURE CORRELATION. SUSCEPTIBILITIES AVAILABLE ONLY ON REQUEST. Performed at [facility], [address], [place], [place] [address]    Report Status 03/18/2024 FINAL  Final  Blood Culture ID Panel (Reflexed)     Status: Abnormal   Collection Time: 03/14/24  7:14 PM  Result Value Ref Range Status   Enterococcus faecalis NOT DETECTED NOT DETECTED Final   Enterococcus Faecium NOT DETECTED NOT DETECTED Final   Listeria monocytogenes NOT DETECTED NOT DETECTED Final   Staphylococcus species DETECTED (A) NOT DETECTED Final    Comment: CRITICAL RESULT CALLED TO, READ BACK BY AND VERIFIED WITH: JASON ROBBINS @ 03/15/2024 2140 AB     Staphylococcus aureus (BCID) NOT DETECTED NOT DETECTED Final   Staphylococcus epidermidis DETECTED (A) NOT DETECTED Final    Comment: Methicillin (oxacillin) resistant coagulase negative staphylococcus. Possible blood culture contaminant (unless isolated from more than one blood  culture draw or clinical case suggests pathogenicity). No antibiotic treatment is indicated for blood  culture contaminants. CRITICAL RESULT CALLED TO, READ BACK BY AND VERIFIED WITH: JASON ROBBINS @ 03/15/2024 2140 AB    Staphylococcus lugdunensis NOT DETECTED NOT DETECTED Final   Streptococcus species NOT DETECTED NOT DETECTED Final   Streptococcus agalactiae NOT DETECTED NOT DETECTED Final   Streptococcus pneumoniae NOT DETECTED NOT DETECTED Final   Streptococcus pyogenes NOT DETECTED NOT DETECTED Final   A.calcoaceticus-baumannii NOT DETECTED NOT DETECTED Final   Bacteroides fragilis NOT DETECTED NOT DETECTED Final   Enterobacterales NOT DETECTED NOT DETECTED Final   Enterobacter cloacae complex NOT DETECTED NOT DETECTED Final   Escherichia coli NOT DETECTED NOT DETECTED Final   Klebsiella aerogenes NOT DETECTED NOT DETECTED Final   Klebsiella oxytoca NOT DETECTED NOT DETECTED Final   Klebsiella pneumoniae NOT DETECTED NOT DETECTED Final   Proteus species NOT DETECTED NOT DETECTED Final   Salmonella species NOT DETECTED NOT DETECTED Final   Serratia marcescens NOT DETECTED NOT DETECTED Final   Haemophilus influenzae NOT DETECTED NOT DETECTED Final   Neisseria meningitidis NOT DETECTED NOT DETECTED Final   Pseudomonas aeruginosa NOT DETECTED NOT DETECTED Final   Stenotrophomonas maltophilia NOT DETECTED NOT DETECTED Final   Candida albicans NOT DETECTED NOT DETECTED Final   Candida auris NOT DETECTED NOT DETECTED Final   Candida glabrata NOT DETECTED NOT DETECTED Final   Candida krusei NOT DETECTED NOT DETECTED Final   Candida parapsilosis NOT DETECTED NOT DETECTED Final   Candida tropicalis NOT DETECTED  NOT DETECTED Final   Cryptococcus neoformans/gattii NOT DETECTED NOT DETECTED Final   Methicillin resistance mecA/C DETECTED (A) NOT DETECTED Final    Comment: CRITICAL RESULT CALLED TO, READ BACK BY AND VERIFIED WITH: JASON ROBBINS @ 03/15/2024 2140 AB Performed at [facility], [address] [place], [place] [address]      Radiology Studies: No results found.  Scheduled Meds:  budesonide -glycopyrrolate -formoterol   2 puff Inhalation BID   donepezil   5 mg Oral Daily   DULoxetine   120 mg Oral Daily   feeding supplement  237 mL Oral BID BM   flecainide   100 mg Oral BID   hydrOXYzine   25 mg Oral BID   melatonin  10 mg Oral QHS   metoprolol  succinate  50 mg Oral Daily   pantoprazole   40 mg Oral BID AC   rosuvastatin   10 mg Oral Daily   Continuous Infusions:   LOS: 10 days   Norval Bar, MD  [facility]  03/24/2024, 1:01 PM   "

## 2024-03-24 NOTE — Progress Notes (Signed)
 Physical Therapy Treatment Patient Details Name: Anita Harrell MRN: [id number] DOB: 11/18/1950 Today's Date: 03/24/2024   History of Present Illness Patient is a 74 year old female with weakness, confusion, neck and back pain. Found to have UTI, T6 and T8 compression fracture. PMH: HFpEF, paroxysmal A-fib, HLD, dementia, depression, and generalized anxiety disorder, chronic neck and back pain, OSA.    PT Comments  Modified session this afternoon, Family at bedside discussed decision to return home with Hospice. Pt has  Oxygen , w/c, RW, Rollator, BSC, and shower chair at home. Pt to receive a hospital bed. I reached out to [facility] for EMS transport at d/c. Pt at baseline has only been transferring from one surface to another. May need a Hoyer lift as well. PT will stay signed on if any questions until d/c.    If plan is discharge home, recommend the following: A lot of help with walking and/or transfers;A lot of help with bathing/dressing/bathroom;Assist for transportation;Help with stairs or ramp for entrance;Assistance with cooking/housework;Assistance with feeding;Direct supervision/assist for medications management;Direct supervision/assist for financial management   Can travel by private vehicle        Equipment Recommendations  Crutches;Hoyer lift    Recommendations for Other Services       Precautions / Restrictions Precautions Precautions: Fall Recall of Precautions/Restrictions: Impaired Precaution/Restrictions Comments: chronic back pain with prior fractures Restrictions Weight Bearing Restrictions Per Provider Order: No     Mobility  Bed Mobility               General bed mobility comments: Pt declined    Transfers                   General transfer comment: Pt declined    Ambulation/Gait               General Gait Details: not attempted   Stairs             Wheelchair Mobility     Tilt Bed    Modified Rankin (Stroke Patients  Only)       Balance Overall balance assessment: Needs assistance Sitting-balance support: Feet supported, Single extremity supported Sitting balance-Leahy Scale: Fair       Standing balance-Leahy Scale: Poor                              Communication Communication Communication: No apparent difficulties  Cognition Arousal: Lethargic Behavior During Therapy: Flat affect   PT - Cognitive impairments: History of cognitive impairments, Initiation, Sequencing, Problem solving                         Following commands: Impaired Following commands impaired: Follows one step commands with increased time    Cueing Cueing Techniques: Verbal cues  Exercises Other Exercises Other Exercises: Discussed at length with family regarding home set up and DME needs, reached out to CM for EMS transport as pt is too ill and can not stand.    General Comments General comments (skin integrity, edema, etc.): Pt very lethargic and tearful today. Session focused on education and d/c DME needsd, etc      Pertinent Vitals/Pain Pain Assessment Pain Assessment: Faces Faces Pain Scale: Hurts little more Pain Location: neck/back Pain Descriptors / Indicators: Discomfort Pain Intervention(s): Limited activity within patient's tolerance    Home Living  Prior Function            PT Goals (current goals can now be found in the care plan section) Acute Rehab PT Goals Patient Stated Goal: Go home. Explained role of rehab prior to returning home.    Frequency    Min 2X/week      PT Plan      Co-evaluation              AM-PAC PT 6 Clicks Mobility   Outcome Measure  Help needed turning from your back to your side while in a flat bed without using bedrails?: A Lot Help needed moving from lying on your back to sitting on the side of a flat bed without using bedrails?: A Lot Help needed moving to and from a bed to a chair  (including a wheelchair)?: Total Help needed standing up from a chair using your arms (e.g., wheelchair or bedside chair)?: Total Help needed to walk in hospital room?: Total Help needed climbing 3-5 steps with a railing? : Total 6 Click Score: 8    End of Session   Activity Tolerance: Patient limited by lethargy Patient left: in bed;with call bell/phone within reach;with family/visitor present Nurse Communication: Mobility status PT Visit Diagnosis: Muscle weakness (generalized) (M62.81);Unsteadiness on feet (R26.81)     Time:  -     Charges:    $Therapeutic Activity: 8-22 mins PT General Charges $$ ACUTE PT VISIT: 1 Visit                    Darice Bohr, PTA  Darice JAYSON Bohr 03/24/2024, 3:56 PM

## 2024-03-25 DIAGNOSIS — N3000 Acute cystitis without hematuria: Secondary | ICD-10-CM | POA: Diagnosis not present

## 2024-03-25 NOTE — Progress Notes (Signed)
 SPIRITUAL CARE AND COUNSELING CONSULT NOTE   VISIT SUMMARY Chaplain provided spiritual/emotional care to Anita Harrell and family while rounding on unit.   SPIRITUAL ENCOUNTER                                                                                                                                                                      Type of Visit: Initial Care provided to:: Pt and family, Patient, Family Conversation partners present during encounter: Nurse Referral source: Chaplain assessment Reason for visit: Routine spiritual support OnCall Visit: No   SPIRITUAL FRAMEWORK  Presenting Themes: Significant life change, Meaning/purpose/sources of inspiration, Community and relationships Community/Connection: Family, Faith community Strengths: family support and faith Patient Stress Factors: Health changes Family Stress Factors: None identified   GOALS   Self/Personal Goals: healing Clinical Care Goals: healing   INTERVENTIONS   Spiritual Care Interventions Made: Compassionate presence, Prayer, Supported grief process    INTERVENTION OUTCOMES   Outcomes: Patient family open to resources, Connection to spiritual care, Connection to values and goals of care  Chaplain assessed Anita Harrell is a [employer] woman. Anita Harrell is supported strongly by her local family. Anita Harrell feels at peace with her current health status and faith. Chaplain provided prayer for Anita Harrell and family upon request.   SPIRITUAL CARE PLAN   Spiritual Care Issues Still Outstanding: Anita Harrell will continue to follow    If immediate needs arise, please contact [facility] 24 hour on call [phone]   Anita Harrell, Chaplain  03/25/2024 1:22 PM

## 2024-03-25 NOTE — Progress Notes (Signed)
 " PROGRESS NOTE    Anita Harrell   FMW:[id number] DOB: November 23, 1950 DOA: 03/14/2024 PCP: Randeen [name], MD  Subjective: No acute events overnight. Seen and examined at bedside with husband and son present. No new complaints. Reports feeling constipated but as per husband had a large BM yesterday. Denies nausea or vomiting with oral intake.   Hospital Course:  74 y.o. female  with medical history significant of chronic HFpEF, paroxysmal A-fib, HLD, dementia, depression, and generalized anxiety disorder, chronic neck and back pain, OSA, who presented to the ED because of general weakness, increasing confusion, neck and back pain.  He has chronic neck and back pain/lumbar radiculopathy and recently got steroid injection on 03/03/2024.  Since discharge from the rehab facility about a week before Thanksgiving, patient's condition has declined.  She has gotten weaker and weaker and she has been intermittently confused and confusion is typically worse in the evenings.  Husband and family has become increasingly difficult to take care of her at home.  She is to be able to ambulate with a walker.  She is now unable to ambulate.  Recently got 2 rounds of antibiotics for UTI.  She was prescribed 5 days of nitrofurantoin by her neurologist on 01/24/2024.  She was given second round of antibiotics with Keflex  on 02/21/2024.  Urine culture from 02/21/2024 showed pansensitive E. coli.    Admitted for recurrent UTI treated with IV ceftriaxone  then transitioned to amoxicillin  to finish a total 5 day course on 03/19/24.   Assessment and Plan:  Bicytopenia  Acute on chronic anemia  Acute thrombocytopenia Etiology remains unclear, concern for malignancy  No signs/symptoms of acute bleed.  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 Babara who  stated patient likely not a good candidate for any treatment given co-morbidities GI recommended outpatient follow up for colonoscopy  - stopped home eliquis  - transfuse for Hgb goal > 7, platelet goal > 50K (given acute SDH) - hematology following - given referral to follow up with GI clinic for possible colonoscopy - palliative team following    Acute encephalopathy Atraumatic tiny subdural hematoma Hx of dementia Waxing and waning in nature Etiology unclear, possibly related to underlying dementia  Less likely due to the very tiny subdural hematoma CT head with tiny incidental SDH Repeat CT head in 24 hours with decrease in SDH Given no recent head trauma, it is unclear if the SDH resulted due to acute thrombocytopenia EEG with encephalopathy without seizures or epileptiform abnormalities  See management of thrombocytopenia as elsewhere Cont melatonin at bedtime Cont hydroxyzine  BID and duloxetine  Monitor oral intake, I/Os, Bms, sleep routine Monitor on fall, delirium, aspiration precautions   Recurrent UTI  S. Epidermidis bloodstream contamination - treated with IV ceftriaxone  then transitioned to amoxicillin  to finish a total 5 day course on 03/19/24.   Hypotension  - resolved with IV hydration and stopped home diuresis.  - follow up with PCP and cardiology for resumption timing given history of heart failure. Euvolemic at present    Thoracic compression fractures at T6 and T8. - Seen by neurosurgery with recommendation for conservative management and TLSO brace for comfort.  - Patient declined TLSO brace.  - Seen by PT/OT during admission. Will need SNF    Chronic hypoxic respiratory failure.  - Continue on home 2-3 L oxygen .  - Wean  as tolerated   DVT prophylaxis: SCDs Start: 03/15/24 0143  SCDs   Code Status: Limited: Do not attempt resuscitation (DNR) -DNR-LIMITED -Do Not Intubate/DNI  Family Communication: updated husband and son at bedside Disposition Plan:  Home with hospice Reason for continuing need for hospitalization: medically ready, family awaiting hospital bed among other supplies to be able to accept patient back home on hospice, CM and hospice team assisting   Objective: Vitals:   03/24/24 1326 03/24/24 2004 03/25/24 0256 03/25/24 0826  BP: (!) 124/57 (!) 125/52 (!) 134/58 129/66  Pulse: 71 73 92 79  Resp: 18 17 17 16   Temp: 97.7 F (36.5 C) 97.7 F (36.5 C) 97.7 F (36.5 C) 97.7 F (36.5 C)  TempSrc: Oral Oral Oral   SpO2: 92% 95%  95%  Weight:      Height:        Intake/Output Summary (Last 24 hours) at 03/25/2024 1110 Last data filed at 03/25/2024 0500 Gross per 24 hour  Intake --  Output 300 ml  Net -300 ml   Filed Weights   03/14/24 1626  Weight: 73 kg    Examination:  Physical Exam Vitals and nursing note reviewed.  Constitutional:      General: She is not in acute distress.    Appearance: She is ill-appearing.     Comments: Weak, frail  HENT:     Head: Normocephalic and atraumatic.  Cardiovascular:     Rate and Rhythm: Normal rate and regular rhythm.     Pulses: Normal pulses.     Heart sounds: Normal heart sounds.  Pulmonary:     Effort: Pulmonary effort is normal.     Breath sounds: Normal breath sounds.  Abdominal:     General: Bowel sounds are normal.     Palpations: Abdomen is soft.  Neurological:     Mental Status: She is alert. Mental status is at baseline.     Data Reviewed: I have personally reviewed following labs and imaging studies  CBC: Recent Labs  Lab 03/21/24 0438 03/21/24 2013 03/22/24 0503 03/23/24 0954 03/24/24 1020  WBC 10.8* 14.6* 12.2* 17.0* 19.6*  HGB 7.6* 7.9* 7.9* 8.8* 8.9*  HCT 23.1* 23.5* 24.0* 27.5* 27.2*  MCV 89.9 90.0 90.6 91.7 89.2  PLT 48* 77* 69* 74* 73*   Basic Metabolic Panel: Recent Labs  Lab 03/19/24 0436 03/20/24 1251 03/20/24 2017 03/21/24 0438  NA 138 135  --  138  K 3.5 3.2*  --  3.9  CL 102 99  --  101  CO2 26 29  --  27  GLUCOSE  93 114*  --  83  BUN 8 7*  --  8  CREATININE 0.47 0.47  --  0.51  CALCIUM  10.9* 11.1*  --  10.3  MG 2.0  --  1.9  --    GFR: Estimated Creatinine Clearance: 58.1 mL/min (by C-G formula based on SCr of 0.51 mg/dL). Liver Function Tests: No results for input(s): AST, ALT, ALKPHOS, BILITOT, PROT, ALBUMIN  in the last 168 hours. No results for input(s): LIPASE, AMYLASE in the last 168 hours. No results for input(s): AMMONIA in the last 168 hours. Coagulation Profile: No results for input(s): INR, PROTIME in the last 168 hours. Cardiac Enzymes: No results for input(s): CKTOTAL, CKMB, CKMBINDEX, TROPONINI in the last 168 hours. ProBNP, BNP (last 5 results) Recent Labs    03/15/24 0823  PROBNP 1,237.0*   HbA1C: No results for input(s): HGBA1C in the last 72 hours. CBG: Recent Labs  Lab 03/22/24 0458 03/22/24 0828 03/22/24 1227 03/22/24 1634 03/22/24 2138  GLUCAP 64* 80 122* 76 108*   Lipid Profile: No results for input(s): CHOL, HDL, LDLCALC, TRIG, CHOLHDL, LDLDIRECT in the last 72 hours. Thyroid  Function Tests: No results for input(s): TSH, T4TOTAL, FREET4, T3FREE, THYROIDAB in the last 72 hours. Anemia Panel: No results for input(s): VITAMINB12, FOLATE, FERRITIN, TIBC, IRON, RETICCTPCT in the last 72 hours. Sepsis Labs: No results for input(s): PROCALCITON, LATICACIDVEN in the last 168 hours.  No results found for this or any previous visit (from the past 240 hours).   Radiology Studies: No results found.  Scheduled Meds:  budesonide -glycopyrrolate -formoterol   2 puff Inhalation BID   donepezil   5 mg Oral Daily   DULoxetine   120 mg Oral Daily   feeding supplement  237 mL Oral BID BM   flecainide   100 mg Oral BID   hydrOXYzine   25 mg Oral BID   melatonin  10 mg Oral QHS   metoprolol  succinate  50 mg Oral Daily   pantoprazole   40 mg Oral BID AC   rosuvastatin   10 mg Oral Daily   Continuous  Infusions:   LOS: 11 days   Norval Bar, MD  [facility]  03/25/2024, 11:10 AM   "

## 2024-03-25 NOTE — Progress Notes (Signed)
 [facility] Liaison Note  Met with patient and family at bedside.  Family confirmed DME has been delivered to the home and set up.  Family reports they have a meeting scheduled for this afternoon with private caregivers to discuss 24/7 care at home. Hospice will continue to follow for discharge.   Please reach out with any hospice related needs or questions.  Thank you, Daphne Shed, LPN [phone]

## 2024-03-25 NOTE — Plan of Care (Signed)
  Problem: Clinical Measurements: Goal: Diagnostic test results will improve Outcome: Progressing Goal: Respiratory complications will improve Outcome: Progressing Goal: Cardiovascular complication will be avoided Outcome: Progressing   Problem: Activity: Goal: Risk for activity intolerance will decrease Outcome: Progressing   Problem: Nutrition: Goal: Adequate nutrition will be maintained Outcome: Progressing   Problem: Coping: Goal: Level of anxiety will decrease Outcome: Progressing   Problem: Elimination: Goal: Will not experience complications related to bowel motility Outcome: Progressing Goal: Will not experience complications related to urinary retention Outcome: Progressing

## 2024-03-25 NOTE — Progress Notes (Signed)
 Per Dr. Cosette, ok to d/c tele order

## 2024-03-25 NOTE — Progress Notes (Signed)
 "                                                                                                                                                                                               Palliative Care Progress Note, Assessment & Plan   Patient Name: Anita Harrell       Date: 03/25/2024 DOB: 08-Sep-1950  Age: 74 y.o. MRN#: [id number] Attending Physician: Cosette Blackwater, MD Primary Care Physician: Tower, [name], MD Admit Date: 03/14/2024  Subjective: Patient is sitting up in bed, awake and alert.  She acknowledges my presence and is able to make her wishes known.  Her husband is at bedside alongside the RN during my visit.  HPI: 74 y.o. female  with past medical history of HFpEF, p. A. Fib, HLD, dementia, depression and GAD, chronic back and neck pain, OSA admitted from home on 03/14/2024 with worsening generalized weakness, increased confusion and worsening neck/back pain.   Recent admit to [facility] 10/3-10/8 after fall, found to have multiple fractures, discharged to [facility], returned home around Thanksgiving Husband states since returning home, Ms. 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 Coles likely to not be a good candidate for treatment of either types of malignancy   Incidental finding of small SDH also found this admission--unlikely contributing to encephalopathy EEG (-) seizure activity   Treated with antibiotic therapy for recurrent UTI   Found to have T6 and T8 compression fracture--neurosurgery consulted recommend conservative management   Palliative medicine was consulted  for assisting with goals of care conversations.  Following up today for symptom management and supporting patient with goals of care discussions.   Summary of counseling/coordination of care: Chart review completed prior to meeting patient including:  -Vital signs: Vital signs remained stable -Progress notes: As per TOC note, DME is in the process of being delivered to home and family is organizing private care to be available on 1/14  After reviewing the patient's chart and assessing the patient at bedside, I spoke with patient in regards to symptom management and goals of care.   Symptoms assessed.  Patient endorses she is feeling mildly anxious.  She is requesting anxiety medication.  RN at bedside as has a Droxia seen available and administered with morning medications.  Patient vocalized  concern that she had seen family members passed away in hospice facilities and wondered if that was the plan for her.  Discussed the difference tween hospice at home and hospice inpatient unit.  Reviewed that patient is not actively at end-of-life and therefore is more appropriate to go home at this time with hospice services to follow.  She endorses relief and says that this sounds like a good plan.  Discussed that patient will no longer have to go back and forth to doctors appointments once home.  Hospice RN, child psychotherapist, nurse techs, and doctors will be bringing the care to her.  She endorsed understanding and appreciation.  Through discussion with husband, DME has been delivered.  They are working to have private care, earlier in the day tomorrow.  Plan remains for patient to be discharged with hospice services at home tomorrow Wednesday 1/14.  No change to plan of care at this time.  PMT will continue to follow and support.  Physical Exam Vitals reviewed.  Constitutional:      General: She is not in acute distress.    Appearance: She is not ill-appearing.  HENT:     Head: Normocephalic.      Mouth/Throat:     Mouth: Mucous membranes are moist.  Eyes:     Pupils: Pupils are equal, round, and reactive to light.  Pulmonary:     Effort: Pulmonary effort is normal.  Abdominal:     Palpations: Abdomen is soft.  Skin:    General: Skin is warm and dry.  Neurological:     Mental Status: She is alert.     Comments: Oriented to self, place, and situation  Psychiatric:        Behavior: Behavior normal.              Recommendations:   Hydroxyzine  PRN for anxiety D/c home with hospice services tomorrow, 1/14 TOC following closely for discharge planning  I personally spent a total of 25 minutes in the care of the patient today including preparing to see the patient, getting/reviewing separately obtained history, performing a medically appropriate exam/evaluation, counseling and educating, and documenting clinical information in the EHR.   Lamarr L. Arvid, DNP, FNP-BC Palliative Medicine Team   "

## 2024-03-26 ENCOUNTER — Other Ambulatory Visit: Payer: Self-pay

## 2024-03-26 DIAGNOSIS — N3000 Acute cystitis without hematuria: Secondary | ICD-10-CM | POA: Diagnosis not present

## 2024-03-26 MED ORDER — PANTOPRAZOLE SODIUM 40 MG PO TBEC
40.0000 mg | DELAYED_RELEASE_TABLET | Freq: Two times a day (BID) | ORAL | 0 refills | Status: DC
  Filled 2024-03-26: qty 60, 30d supply, fill #0

## 2024-03-26 MED ORDER — BISACODYL 10 MG RE SUPP
10.0000 mg | Freq: Every day | RECTAL | Status: DC | PRN
  Filled 2024-03-26: qty 1

## 2024-03-26 MED ORDER — CHLORHEXIDINE GLUCONATE CLOTH 2 % EX PADS
6.0000 | MEDICATED_PAD | Freq: Every day | CUTANEOUS | Status: DC
  Administered 2024-03-26: 6 via TOPICAL

## 2024-03-26 MED ORDER — BISACODYL 10 MG RE SUPP
10.0000 mg | Freq: Every day | RECTAL | 0 refills | Status: DC | PRN
  Filled 2024-03-26: qty 4, 4d supply, fill #0

## 2024-03-26 NOTE — Plan of Care (Signed)
"                                                                                                                                            °                                                   °  Palliative Care Progress Note   Patient Name: Anita Harrell       Date: 03/26/2024 DOB: 02-02-1951  Age: 74 y.o. MRN#: [id number] Attending Physician: Cosette Blackwater, MD Primary Care Physician: Tower, [name], MD Admit Date: 03/14/2024  Chart reviewed.  Plan remains for patient to discharge home with hospice services to follow.  Discharge summary in place.  As per TOC note[facility].  No need for in person PMT visit at this time.  Thank you for allowing the Palliative Medicine Team to assist in the care of [name].  Lamarr L. Arvid, DNP, FNP-BC Palliative Medicine Team    No charge "

## 2024-03-26 NOTE — Discharge Summary (Signed)
 " [facility] Physician Discharge Summary   Patient name: Anita Harrell  Admit date:     03/14/2024  Discharge date: 03/26/2024  Attending Physician: JERELENE CRITCHLEY [[id number]]  Discharge Physician: Norval Bar   PCP: Randeen [name], MD  Admitted From: Home  Disposition:  Residential Hospice  Recommendations for Outpatient Follow-up:  Follow up with PCP in 1-2 weeks Follow up with home hospice for symptomatic care and goals of care discussions  Home Health:No Equipment/Devices: @ECDMELIST @  Discharge Condition:Hospice CODE STATUS:DNR/DNI Diet recommendation: Regular Fluid Restriction: None  Hospital Summary:  74 y.o. female  with medical history significant of chronic HFpEF, paroxysmal A-fib, HLD, dementia, depression, and generalized anxiety disorder, chronic neck and back pain, OSA, who presented to the ED because of general weakness, increasing confusion, neck and back pain.  He has chronic neck and back pain/lumbar radiculopathy and recently got steroid injection on 03/03/2024.  Since discharge from the rehab facility about a week before Thanksgiving, patient's condition has declined.  She has gotten weaker and weaker and she has been intermittently confused and confusion is typically worse in the evenings.  Husband and family has become increasingly difficult to take care of her at home.  She is to be able to ambulate with a walker.  She is now unable to ambulate.  Recently got 2 rounds of antibiotics for UTI.  She was prescribed 5 days of nitrofurantoin by her neurologist on 01/24/2024.  She was given second round of antibiotics with Keflex  on 02/21/2024.  Urine culture from 02/21/2024 showed pansensitive E. coli.    Admitted for recurrent UTI treated with IV ceftriaxone  then transitioned to amoxicillin  to finish a total 5 day course on 03/19/24.  Hospital Course by Problem:  Bicytopenia  Acute on chronic anemia  Acute thrombocytopenia Concern for aggressive  hematologic malignancy. No signs/symptoms of acute bleed. Hgb 8.9 < 7.9, baseline 11-12s. Platelets 73 < 74, baseline 60-100s. Status post bone marrow biopsy on 03/19/23 concerning for aggressive hematologic malignancy that has replaced normal bone marrow. Discussed with oncology Dr. Babara who stated patient likely not a good candidate for any treatment given co-morbidities. Stopped home eliquis . Transitioned to DNR/DNI status as per family discussions with palliative care team - Plan to discharge home on hospice for symptomatic care and ongoing goals of care discussions    Acute encephalopathy Atraumatic tiny subdural hematoma Hx of dementia Waxing and waning in nature. Likely behavioral disturbances in the setting to underlying dementia. Unlikely due to the very tiny incidental subdural hematoma seen of CT head. Repeat CT head in 24 hours with decrease in SDH. Given no recent head trauma, it is unclear if the SDH resulted due to acute thrombocytopenia. EEG with encephalopathy without seizures or epileptiform abnormalities  Cont melatonin at bedtime Cont hydroxyzine  BID and duloxetine  Started on bowel regimen. Monitor for regular Bms - Plan to discharge home on hospice for symptomatic care and ongoing goals of care discussions   Recurrent UTI  S. Epidermidis bloodstream contamination. Treated with IV ceftriaxone  then transitioned to amoxicillin  to finish a total 5 day course on 03/19/24.   Hypotension  Resolved with IV hydration and stopped home diuresis.  - Plan to discharge home on hospice for symptomatic care and ongoing goals of care discussions    Thoracic compression fractures at T6 and T8. Seen by neurosurgery with recommendation for conservative management and TLSO brace for comfort. Patient declined TLSO brace. Seen by PT/OT during admission.  - Plan to discharge home on hospice for  symptomatic care and ongoing goals of care discussions   Chronic hypoxic respiratory failure.  - Continue on  home 2-3 L oxygen  for comfort  Discharge Diagnoses:  Principal Problem:   UTI (urinary tract infection) Active Problems:   Thrombocytopenia   Other pancytopenia (HCC)   Pressure injury of skin   Compression fracture of body of thoracic vertebra (HCC)   Occult blood in stools   SDH (subdural hematoma) (HCC)   Anemia   Neoplasm of bone marrow   Discharge Instructions  Discharge Instructions     Ambulatory referral to Gastroenterology   Complete by: As directed    What is the reason for referral?: Colonoscopy   Ambulatory referral to Hematology / Oncology   Complete by: As directed    Discharge wound care:   Complete by: As directed    R buttock stage 2 pressure injury present on admission Change 2 x weekly and PRN if soiled   Discharge wound care:   Complete by: As directed    1.        Cleanse sacral wound with Vashe, do not rinse. Apply silver  hydrofiber Soila [phone]) cut to fit wound bed daily and secure with silicone foam. Lift foam daily to reapply silver , change foam q3 days and prn soiling.  2.         Cleanse R breast wound with Vashe, do not rinse.  Apply Xeroform gauze Soila [phone]) to wound bed daily and secure with silicone foam or dry gauze and clothe tape whichever is preferred   Increase activity slowly   Complete by: As directed    Increase activity slowly   Complete by: As directed       Allergies as of 03/26/2024       Reactions   Abilify [aripiprazole] Other (See Comments)   Vision problems   Ambien  [zolpidem ] Other (See Comments)   Hallucinations MS change    Codeine Nausea And Vomiting   Patient able to tolerate hydrocodone  and oxycodone    Lipitor [atorvastatin] Other (See Comments)   Myalgias    Vesicare  [solifenacin ] Other (See Comments)   MS change   Wellbutrin  [bupropion ] Other (See Comments)   Unknown reaction        Medication List     STOP taking these medications    apixaban  5 MG Tabs tablet Commonly known as: ELIQUIS     carbidopa-levodopa 25-100 MG tablet Commonly known as: SINEMET IR   cephALEXin  500 MG capsule Commonly known as: KEFLEX    cyclobenzaprine  10 MG tablet Commonly known as: FLEXERIL    diazepam  5 MG tablet Commonly known as: VALIUM    furosemide  20 MG tablet Commonly known as: LASIX    lidocaine  5 % Commonly known as: LIDODERM        TAKE these medications    acetaminophen  500 MG tablet Commonly known as: TYLENOL  Take 500 mg by mouth in the morning and at bedtime.   albuterol  108 (90 Base) MCG/ACT inhaler Commonly known as: VENTOLIN  HFA Inhale 2 puffs into the lungs every 6 (six) hours as needed for wheezing or shortness of breath.   bisacodyl  10 MG suppository Commonly known as: DULCOLAX Place 1 suppository (10 mg total) rectally daily as needed for moderate constipation.   donepezil  5 MG tablet Commonly known as: ARICEPT  Take 5 mg by mouth daily.   DULoxetine  60 MG capsule Commonly known as: CYMBALTA  Take 120 mg by mouth daily.   flecainide  100 MG tablet Commonly known as: TAMBOCOR  Take 1 tablet (100 mg total) by mouth  2 (two) times daily.   hydrOXYzine  25 MG capsule Commonly known as: VISTARIL  Take 25 mg by mouth 2 (two) times daily.   Melatonin 5 MG Caps Take 10 mg by mouth at bedtime.   metoprolol  succinate 50 MG 24 hr tablet Commonly known as: TOPROL -XL TAKE ONE TABLET BY MOUTH TWICE A DAY WITH MEALS   pantoprazole  40 MG tablet Commonly known as: PROTONIX  Take 1 tablet (40 mg total) by mouth 2 (two) times daily before a meal.   polyethylene glycol 17 g packet Commonly known as: MIRALAX  / GLYCOLAX  Take 17 g by mouth daily as needed for mild constipation, moderate constipation or severe constipation.   potassium chloride  SA 20 MEQ tablet Commonly known as: KLOR-CON  M Take 0.5 tablets (10 mEq total) by mouth 3 (three) times daily.   rosuvastatin  10 MG tablet Commonly known as: CRESTOR  TAKE ONE TABLET BY MOUTH ONCE A DAY   traMADol  50 MG  tablet Commonly known as: ULTRAM  Take 50-100 mg by mouth every 8 (eight) hours as needed for moderate pain (pain score 4-6).   traZODone  100 MG tablet Commonly known as: DESYREL  Take 200 mg by mouth at bedtime.   Trelegy Ellipta  100-62.5-25 MCG/ACT Aepb Generic drug: Fluticasone -Umeclidin-Vilant Inhale 1 puff into the lungs daily.   VITAMIN D PO Take 1 capsule by mouth daily.       ASK your doctor about these medications    amoxicillin  500 MG capsule Commonly known as: AMOXIL  Take 1 capsule (500 mg total) by mouth every 8 (eight) hours for 2 doses. Ask about: Should I take this medication?   HYDROcodone -acetaminophen  5-325 MG tablet Commonly known as: NORCO/VICODIN Take 1 tablet by mouth every 6 (six) hours as needed for severe pain (pain score 7-10) or moderate pain (pain score 4-6). Ask about: Should I take this medication?               Discharge Care Instructions  (From admission, onward)           Start     Ordered   03/26/24 0000  Discharge wound care:       Comments: 1.        Cleanse sacral wound with Vashe, do not rinse. Apply silver  hydrofiber Soila [phone]) cut to fit wound bed daily and secure with silicone foam. Lift foam daily to reapply silver , change foam q3 days and prn soiling.  2.         Cleanse R breast wound with Vashe, do not rinse.  Apply Xeroform gauze Soila [phone]) to wound bed daily and secure with silicone foam or dry gauze and clothe tape whichever is preferred   03/26/24 1337   03/19/24 0000  Discharge wound care:       Comments: R buttock stage 2 pressure injury present on admission Change 2 x weekly and PRN if soiled   03/19/24 1246            Contact information for follow-up providers     Ulis Bottcher, PA-C Follow up on 04/07/2024.   Specialty: Physician Assistant Why: with thoracic xrays prior. Contact information: [address] [place] [address] [phone]              Contact  information for after-discharge care     Destination     [facility] .   Service: Skilled Nursing Contact information: [address] [place]  [address] [phone]  Allergies[1]  Discharge Exam: Vitals:   03/26/24 0423 03/26/24 1032  BP: 136/65 125/60  Pulse: 73 64  Resp: 18 16  Temp: 98 F (36.7 C) 98 F (36.7 C)  SpO2: 92% 95%    Physical Exam Vitals and nursing note reviewed.  Constitutional:      General: She is not in acute distress.    Appearance: She is ill-appearing.     Comments: Weak, frail  HENT:     Head: Normocephalic and atraumatic.  Cardiovascular:     Rate and Rhythm: Normal rate and regular rhythm.     Pulses: Normal pulses.     Heart sounds: Normal heart sounds.  Pulmonary:     Effort: Pulmonary effort is normal.     Breath sounds: Normal breath sounds.  Abdominal:     General: Bowel sounds are normal.     Palpations: Abdomen is soft.  Neurological:     Mental Status: She is alert. Mental status is at baseline.     The results of significant diagnostics from this hospitalization (including imaging, microbiology, ancillary and laboratory) are listed below for reference.    Microbiology: No results found for this or any previous visit (from the past 240 hours).   Labs: ProBNP, BNP (last 5 results) Recent Labs    03/15/24 0823  PROBNP 1,237.0*   Basic Metabolic Panel: Recent Labs  Lab 03/20/24 1251 03/20/24 2017 03/21/24 0438  NA 135  --  138  K 3.2*  --  3.9  CL 99  --  101  CO2 29  --  27  GLUCOSE 114*  --  83  BUN 7*  --  8  CREATININE 0.47  --  0.51  CALCIUM  11.1*  --  10.3  MG  --  1.9  --    Liver Function Tests: No results for input(s): AST, ALT, ALKPHOS, BILITOT, PROT, ALBUMIN  in the last 168 hours. No results for input(s): LIPASE, AMYLASE in the last 168 hours. No results for input(s): AMMONIA in the last 168  hours. CBC: Recent Labs  Lab 03/21/24 0438 03/21/24 2013 03/22/24 0503 03/23/24 0954 03/24/24 1020  WBC 10.8* 14.6* 12.2* 17.0* 19.6*  HGB 7.6* 7.9* 7.9* 8.8* 8.9*  HCT 23.1* 23.5* 24.0* 27.5* 27.2*  MCV 89.9 90.0 90.6 91.7 89.2  PLT 48* 77* 69* 74* 73*   Cardiac Enzymes: No results for input(s): CKTOTAL, CKMB, CKMBINDEX, TROPONINI, TROPONINIHS in the last 168 hours. BNP: No results for input(s): BNP in the last 168 hours. CBG: Recent Labs  Lab 03/22/24 0458 03/22/24 0828 03/22/24 1227 03/22/24 1634 03/22/24 2138  GLUCAP 64* 80 122* 76 108*   D-Dimer No results for input(s): DDIMER in the last 72 hours. Hgb A1c No results for input(s): HGBA1C in the last 72 hours. Lipid Profile No results for input(s): CHOL, HDL, LDLCALC, TRIG, CHOLHDL, LDLDIRECT in the last 72 hours. Thyroid  function studies No results for input(s): TSH, T4TOTAL, FREET4, T3FREE, THYROIDAB in the last 72 hours.  Invalid input(s): FREET3 Anemia work up No results for input(s): VITAMINB12, FOLATE, FERRITIN, TIBC, IRON, RETICCTPCT in the last 72 hours. Urinalysis    Component Value Date/Time   COLORURINE AMBER (A) 03/14/2024 1621   APPEARANCEUR CLOUDY (A) 03/14/2024 1621   LABSPEC 1.020 03/14/2024 1621   PHURINE 5.0 03/14/2024 1621   GLUCOSEU NEGATIVE 03/14/2024 1621   HGBUR NEGATIVE 03/14/2024 1621   BILIRUBINUR NEGATIVE 03/14/2024 1621   BILIRUBINUR Negative 02/21/2024 1258   KETONESUR NEGATIVE 03/14/2024 1621   PROTEINUR 30 (  A) 03/14/2024 1621   UROBILINOGEN 0.2 02/21/2024 1258   NITRITE NEGATIVE 03/14/2024 1621   LEUKOCYTESUR TRACE (A) 03/14/2024 1621   Sepsis Labs Recent Labs  Lab 03/21/24 2013 03/22/24 0503 03/23/24 0954 03/24/24 1020  WBC 14.6* 12.2* 17.0* 19.6*    Procedures/Studies: CT HEAD WO CONTRAST ( ) Result Date: 03/21/2024 EXAM: CT HEAD WITHOUT CONTRAST 03/21/2024 10:53:00 AM TECHNIQUE: CT of the head was performed  without the administration of intravenous contrast. Automated exposure control, iterative reconstruction, and/or weight based adjustment of the mA/kV was utilized to reduce the radiation dose to as low as reasonably achievable. COMPARISON: 03/20/2024 CLINICAL HISTORY: Altered mental status, nontraumatic (Ped 0-17y); Subdural hematoma; 24-hour follow up study to assess subdural hematoma. FINDINGS: BRAIN AND VENTRICLES: Slightly decreased left parafalcine subdural hematoma. No acute hemorrhage. No evidence of acute infarct. No hydrocephalus. No mass effect or midline shift. ORBITS: No acute abnormality. SINUSES: Similar paranasal sinus mucosal thickening. SOFT TISSUES AND SKULL: No acute soft tissue abnormality. No skull fracture. IMPRESSION: 1. Slightly decreased left parafalcine subdural hematoma. Electronically signed by: [name] Stanford MD MD 03/21/2024 08:20 PM EST RP Workstation: [id number]   EEG adult Result Date: 03/21/2024 Shelton Arlin KIDD, MD     03/21/2024 12:36 PM Patient Name: KEILYN HAGGARD MRN: [id number] Epilepsy Attending: Arlin KIDD Shelton Referring Physician/Provider: Cosette Blackwater, MD Date: 03/21/2024 Duration: 25.36 mins Patient history: 74yo F with ams. EEG to evaluate for seizure. Level of alertness: Awake, asleep AEDs during EEG study: None Technical aspects: This EEG study was done with scalp electrodes positioned according to the 10-20 International system of electrode placement. Electrical activity was reviewed with band pass filter of 1-70Hz , sensitivity of 7 uV/mm, display speed of 51mm/sec with a 60Hz  notched filter applied as appropriate. EEG data were recorded continuously and digitally stored.  Video monitoring was available and reviewed as appropriate. Description: No clear posterior dominant rhythm was seen. Sleep was characterized by sleep spindles (12 to 14 Hz), maximal frontocentral region. There is continuous generalized 3 to 6 Hz theta-delta slowing. Hyperventilation and photic  stimulation were not performed.   ABNORMALITY - Continuous slow, generalized IMPRESSION: This study is suggestive of generalized cerebral dysfunction (encephalopathy). No seizures or epileptiform discharges were seen throughout the recording. Priyanka KIDD Shelton   CT HEAD WO CONTRAST ( ) Addendum Date: 03/20/2024 ADDENDUM #1 : Study discussed by telephone with Doctor Legend Pecore at 1243 hours on March 20, 2024 ---------------------------------------------------- Electronically signed by: Helayne Hurst MD MD 03/20/2024 01:03 PM EST RP Workstation: [id number]   Result Date: 03/20/2024 ORIGINAL REPORT EXAM: CT HEAD WITHOUT CONTRAST 03/20/2024 10:11:57 AM TECHNIQUE: CT of the head was performed without the administration of intravenous contrast. Automated exposure control, iterative reconstruction, and/or weight based adjustment of the mA/kV was utilized to reduce the radiation dose to as low as reasonably achievable. COMPARISON: Brain MRI 09/25/2023, Head CT 12/15/2023. CLINICAL HISTORY: 74 year old female. Altered mental status, nontraumatic. Recently being evaluated for anemia and thrombocytopenia FINDINGS: BRAIN AND VENTRICLES: Brain volume stable from last year, within normal limits for age. Gray white differentiation stable and within normal limits for age. New intermediate density thickening along the interhemispheric fissure (series 4 image 22) which is fairly extensive anterior to posterior but measures only 2 to 3 mm in thickness. This is most compatible with trace parafalcine subdural hematoma (series 2 image 27). No other intracranial hemorrhage identified. No evidence of acute infarct. No hydrocephalus. No extra-axial collection (except for the parafalcine subdural hematoma). No mass effect or midline shift. No suspicious intracranial vascular  hyperdensity. Calcified atherosclerosis at the skull base. ORBITS: No acute abnormality. SINUSES: Widely scattered bilateral paranasal sinuses mucosal thickening is new  with bubbly opacity. Tympanic cavities remain well aerated. Mild right mastoid effusion. Mild retained secretions in the visible nasopharynx. SOFT TISSUES AND SKULL: No acute soft tissue abnormality. No skull fracture. IMPRESSION: 1. Trace parafalcine subdural hematoma (2-3 mm) appears acute or subacute, without intracranial mass effect. No other acute intracranial abnormality. 2. Bilateral paranasal sinus inflammation. Electronically signed by: Helayne Hurst MD MD 03/20/2024 12:23 PM EST RP Workstation: [id number]   IR BONE MARROW BIOPSY & ASPIRATION Result Date: 03/18/2024 INDICATION: 74 year old with anemia and thrombocytopenia. EXAM: FLUOROSCOPIC GUIDED BONE MARROW ASPIRATES AND BIOPSY Physician: Juliene SAUNDERS. Henn, MD MEDICATIONS: Moderate sedation ANESTHESIA/SEDATION: Moderate (conscious) sedation was employed during this procedure. A total of Versed  1 mg and fentanyl  50 mcg was administered intravenously at the order of the provider performing the procedure. Total intra-service moderate sedation time:  . Patient's level of consciousness and vital signs were monitored continuously by radiology nurse throughout the procedure under the supervision of the provider performing the procedure. FLUOROSCOPY: Radiation Exposure Index (as provided by the fluoroscopic device): 3 mGy Kerma COMPLICATIONS: None immediate. PROCEDURE: The procedure was explained to the patient. The risks and benefits of the procedure were discussed and the patient's questions were addressed. Informed consent was obtained from the patient. The patient was placed prone on interventional table. The back was prepped and draped in sterile fashion. Maximal barrier sterile technique was utilized including caps, mask, sterile gowns, sterile gloves, sterile drape, hand hygiene and skin antiseptic. The skin and right posterior ilium were anesthetized with 1% lidocaine . 11 gauge bone needle was directed into the right ilium with fluoroscopic guidance.  Two aspirates and 2 core biopsies were obtained. Bandage placed over the puncture site. Fluoroscopic image saved for documentation. IMPRESSION: Fluoroscopic guided bone marrow aspiration and core biopsy. Electronically Signed   By: Juliene Balder M.D.   On: 03/18/2024 11:23   CT CERVICAL SPINE WO CONTRAST Result Date: 03/15/2024 EXAM: CT CERVICAL SPINE WITHOUT CONTRAST 03/15/2024 02:22:42 AM TECHNIQUE: CT of the cervical spine was performed without the administration of intravenous contrast. Multiplanar reformatted images are provided for review. Automated exposure control, iterative reconstruction, and/or weight based adjustment of the mA/kV was utilized to reduce the radiation dose to as low as reasonably achievable. COMPARISON: None available. CLINICAL HISTORY: neck pain FINDINGS: BONES AND ALIGNMENT: No acute fracture or traumatic malalignment. DEGENERATIVE CHANGES: Mild degenerative disc disease without spinal canal stenosis. Multilevel facet arthrosis. No high-grade foraminal stenosis. SOFT TISSUES: No prevertebral soft tissue swelling. IMPRESSION: 1. Mild degenerative disc disease without spinal canal stenosis. 2. Multilevel facet arthrosis without high-grade foraminal stenosis. Electronically signed by: [name] Stanford MD 03/15/2024 02:31 AM EST RP Workstation: [id number]   CT Angio Chest/Abd/Pel for Dissection W and/or Wo Contrast Result Date: 03/14/2024 EXAM: CTA CHEST, ABDOMEN AND PELVIS WITH AND WITHOUT CONTRAST 03/14/2024 08:35:25 PM TECHNIQUE: CTA of the chest was performed with and without the administration of intravenous contrast. CTA of the abdomen and pelvis was performed with and without the administration of intravenous contrast. Multiplanar reformatted images are provided for review. MIP images are provided for review. Automated exposure control, iterative reconstruction, and/or weight based adjustment of the mA/kV was utilized to reduce the radiation dose to as low as reasonably achievable.  COMPARISON: 12/15/2023. CLINICAL HISTORY: weakness, back pain FINDINGS: VASCULATURE: Coronary artery and aortic atherosclerosis are present. AORTA: Aortic atherosclerosis is present. No acute finding. No abdominal aortic  aneurysm. No dissection. PULMONARY ARTERIES: No pulmonary embolism with the limits of this exam. GREAT VESSELS OF AORTIC ARCH: No acute finding. No dissection. No arterial occlusion or significant stenosis. CELIAC TRUNK: No acute finding. No occlusion or significant stenosis. SUPERIOR MESENTERIC ARTERY: No acute finding. No occlusion or significant stenosis. INFERIOR MESENTERIC ARTERY: No acute finding. No occlusion or significant stenosis. RENAL ARTERIES: No acute finding. No occlusion or significant stenosis. ILIAC ARTERIES: No acute finding. No occlusion or significant stenosis. CHEST: MEDIASTINUM: The heart is borderline in size. Coronary artery atherosclerosis is present. No mediastinal lymphadenopathy. The pericardium demonstrates no acute abnormality. LUNGS AND PLEURA: Dependent opacities in the lower lobes bilaterally, left greater than right. Favor atelectasis although pneumonia cannot be excluded in the left lower lobe. No evidence of pleural effusion or pneumothorax. THORACIC BONES AND SOFT TISSUES: Healed posterior right lower rib fractures. These are unchanged since prior study. Mild depression through the inferior endplate of T6 and superior endplate of T8, new since prior study. ABDOMEN AND PELVIS: LIVER: The liver is unremarkable. GALLBLADDER AND BILE DUCTS: Gallbladder is unremarkable. No biliary ductal dilatation. SPLEEN: The spleen is unremarkable. PANCREAS: The pancreas is unremarkable. ADRENAL GLANDS: Bilateral adrenal glands demonstrate no acute abnormality. KIDNEYS, URETERS AND BLADDER: No stones in the kidneys or ureters. No hydronephrosis. No perinephric or periureteral stranding. Urinary bladder is unremarkable. GI AND BOWEL: Stomach and duodenal sweep demonstrate no acute  abnormality. There is no bowel obstruction. No abnormal bowel wall thickening or distension. REPRODUCTIVE: Prior hysterectomy. No adnexal mass. PERITONEUM AND RETROPERITONEUM: No ascites or free air. LYMPH NODES: No lymphadenopathy. ABDOMINAL BONES AND SOFT TISSUES: Right old fracture noted at the superior and inferior left pubic rami and left sacrum. Chronic stable mild compression fracture at L5. No acute soft tissue abnormality. IMPRESSION: 1. Coronary artery, aortic atherosclerosis. No evidence of aneurysm or dissection. 2. New vertebral endplate compression fractures at T6 and T8 since prior study. 3. Dependent lower lobe opacities, left greater than right, favored atelectasis, although left lower lobe pneumonia is not excluded. Electronically signed by: [name] Crease MD 03/14/2024 09:29 PM EST RP Workstation: [id number]   DG Chest Portable 1 View Result Date: 03/14/2024 EXAM: 1 VIEW(S) XRAY OF THE CHEST 03/14/2024 07:31:00 PM COMPARISON: Comparison with 12/14/2023. CLINICAL HISTORY: weakness FINDINGS: LUNGS AND PLEURA: Shallow inspiration. Infiltration or atelectasis in the left base is new since the prior study. Probable small left pleural effusion. No pneumothorax. HEART AND MEDIASTINUM: No acute abnormality of the cardiac and mediastinal silhouettes. BONES AND SOFT TISSUES: Postoperative changes in the base of the neck. Degenerative changes in the spine and shoulders. No acute osseous abnormality. IMPRESSION: 1. New left basilar infiltration or atelectasis and probable small left pleural effusion. Electronically signed by: Elsie Gravely MD 03/14/2024 07:42 PM EST RP Workstation: [id number]    Time coordinating discharge: 45 mins  SIGNED:  Norval Bar, MD [facility] 03/26/2024, 1:42 PM     [1]  Allergies Allergen Reactions   Abilify [Aripiprazole] Other (See Comments)    Vision problems   Ambien  [Zolpidem ] Other (See Comments)    Hallucinations MS change    Codeine Nausea And  Vomiting    Patient able to tolerate hydrocodone  and oxycodone    Lipitor [Atorvastatin] Other (See Comments)    Myalgias    Vesicare  [Solifenacin ] Other (See Comments)    MS change   Wellbutrin  [Bupropion ] Other (See Comments)    Unknown reaction   "

## 2024-03-26 NOTE — TOC Transition Note (Signed)
 Transition of Care [facility]) - Discharge Note   Patient Details  Name: Anita Harrell MRN: [id number] Date of Birth: 03/16/1950  Transition of Care [facility]) CM/SW Contact:  Shasta DELENA Daring, RN Phone Number: 03/26/2024, 1:52 PM   Clinical Narrative:    Patient has discharge orders. Called husband to confirm address for [facility].  [name] Gin notified of discharge.  RNCM signing off.    Final next level of care: Home w Hospice Care Barriers to Discharge: Barriers Resolved   Patient Goals and CMS Choice            Discharge Placement                Patient to be transferred to facility by: [facility] Name of family member notified: Natane Heward Patient and family notified of of transfer: 03/26/24  Discharge Plan and Services Additional resources added to the After Visit Summary for   In-house Referral: Clinical Social Work                                   Social Drivers of Health (SDOH) Interventions SDOH Screenings   Food Insecurity: No Food Insecurity (03/15/2024)  Housing: Low Risk (03/15/2024)  Transportation Needs: No Transportation Needs (03/15/2024)  Utilities: Not At Risk (03/15/2024)  Alcohol Screen: Low Risk (07/27/2023)  Depression (PHQ2-9): High Risk (02/21/2024)  Financial Resource Strain: Low Risk  (09/19/2023)   Received from [facility]  Physical Activity: Inactive (07/27/2023)  Social Connections: Socially Integrated (03/15/2024)  Recent Concern: Social Connections - Moderately Isolated (12/16/2023)  Stress: No Stress Concern Present (07/27/2023)  Tobacco Use: Medium Risk (03/14/2024)  Health Literacy: Adequate Health Literacy (07/27/2023)     Readmission Risk Interventions     No data to display

## 2024-03-26 NOTE — Plan of Care (Signed)
  Problem: Clinical Measurements: Goal: Will remain free from infection Outcome: Progressing   Problem: Nutrition: Goal: Adequate nutrition will be maintained Outcome: Progressing   Problem: Coping: Goal: Level of anxiety will decrease Outcome: Progressing   Problem: Pain Managment: Goal: General experience of comfort will improve and/or be controlled Outcome: Progressing   Problem: Safety: Goal: Ability to remain free from injury will improve Outcome: Progressing

## 2024-03-27 ENCOUNTER — Telehealth: Payer: Self-pay

## 2024-03-27 NOTE — Transitions of Care (Post Inpatient/ED Visit) (Signed)
" ° °  03/27/2024  Name: Anita Harrell MRN: [id number] DOB: 01/22/1951  Today's TOC FU Call Status: Today's TOC FU Call Status:: Successful TOC FU Call Completed TOC FU Call Complete Date: 03/27/24  Patient's Name and Date of Birth confirmed. Name, DOB (HIPAA verified by spouse/ dpr [name] Gin)  Transition Care Management Follow-up Telephone Call Date of Discharge: 03/26/24 Discharge Facility: [facility]) Type of Discharge: Inpatient Admission Primary Inpatient Discharge Diagnosis:: UTI (urinary tract infection)  Telephone call with spouse/ dpr [name] Gin who confirmed patient was admitted to hospice on 03/26/24.   Arvin Seip RN, BSN, CCM [employer], Population Health Case Manager Phone: [phone]  "

## 2024-04-03 ENCOUNTER — Encounter (HOSPITAL_COMMUNITY): Payer: Self-pay

## 2024-04-03 ENCOUNTER — Inpatient Hospital Stay: Attending: Oncology | Admitting: Oncology

## 2024-04-07 ENCOUNTER — Ambulatory Visit: Admitting: Physician Assistant

## 2024-04-13 DEATH — deceased

## 2024-06-19 ENCOUNTER — Ambulatory Visit: Admitting: Pulmonary Disease

## 2024-07-30 ENCOUNTER — Ambulatory Visit

## 2024-07-31 ENCOUNTER — Ambulatory Visit: Admitting: Student
